# Patient Record
Sex: Male | Born: 1995 | Hispanic: Yes | Marital: Single | Smoking: Never smoker
Health system: Southern US, Community
[De-identification: ages and names within clinical notes are randomized; demographics above are authoritative.]

## PROBLEM LIST (undated history)

## (undated) DIAGNOSIS — G7100 Muscular dystrophy, unspecified: Secondary | ICD-10-CM

## (undated) DIAGNOSIS — I959 Hypotension, unspecified: Secondary | ICD-10-CM

## (undated) DIAGNOSIS — Z789 Other specified health status: Secondary | ICD-10-CM

## (undated) HISTORY — PX: COLOSTOMY: SHX63

## (undated) HISTORY — PX: EYE SURGERY: SHX253

## (undated) HISTORY — PX: SMALL INTESTINE SURGERY: SHX150

---
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
## 2016-05-04 ENCOUNTER — Emergency Department (HOSPITAL_BASED_OUTPATIENT_CLINIC_OR_DEPARTMENT_OTHER): Payer: 59

## 2016-05-04 ENCOUNTER — Encounter (HOSPITAL_BASED_OUTPATIENT_CLINIC_OR_DEPARTMENT_OTHER): Payer: Self-pay | Admitting: Emergency Medicine

## 2016-05-04 ENCOUNTER — Inpatient Hospital Stay (HOSPITAL_BASED_OUTPATIENT_CLINIC_OR_DEPARTMENT_OTHER)
Admission: EM | Admit: 2016-05-04 | Discharge: 2016-05-11 | DRG: 871 | Disposition: A | Discharge status: Home / Self Care | Payer: 59 | Attending: Family Medicine | Admitting: Family Medicine

## 2016-05-04 DIAGNOSIS — E8729 Other acidosis: Secondary | ICD-10-CM

## 2016-05-04 DIAGNOSIS — G7100 Muscular dystrophy, unspecified: Secondary | ICD-10-CM

## 2016-05-04 DIAGNOSIS — E873 Alkalosis: Secondary | ICD-10-CM

## 2016-05-04 DIAGNOSIS — E874 Mixed disorder of acid-base balance: Secondary | ICD-10-CM | POA: Diagnosis present

## 2016-05-04 DIAGNOSIS — E440 Moderate protein-calorie malnutrition: Secondary | ICD-10-CM

## 2016-05-04 DIAGNOSIS — A084 Viral intestinal infection, unspecified: Secondary | ICD-10-CM | POA: Diagnosis present

## 2016-05-04 DIAGNOSIS — A419 Sepsis, unspecified organism: Secondary | ICD-10-CM

## 2016-05-04 DIAGNOSIS — E876 Hypokalemia: Secondary | ICD-10-CM | POA: Diagnosis present

## 2016-05-04 DIAGNOSIS — E872 Acidosis: Secondary | ICD-10-CM

## 2016-05-04 DIAGNOSIS — D649 Anemia, unspecified: Secondary | ICD-10-CM | POA: Diagnosis present

## 2016-05-04 DIAGNOSIS — R000 Tachycardia, unspecified: Secondary | ICD-10-CM

## 2016-05-04 DIAGNOSIS — D72829 Elevated white blood cell count, unspecified: Secondary | ICD-10-CM

## 2016-05-04 DIAGNOSIS — D696 Thrombocytopenia, unspecified: Secondary | ICD-10-CM | POA: Diagnosis present

## 2016-05-04 DIAGNOSIS — Z993 Dependence on wheelchair: Secondary | ICD-10-CM

## 2016-05-04 DIAGNOSIS — Z681 Body mass index (BMI) 19 or less, adult: Secondary | ICD-10-CM

## 2016-05-04 DIAGNOSIS — J029 Acute pharyngitis, unspecified: Secondary | ICD-10-CM | POA: Diagnosis present

## 2016-05-04 DIAGNOSIS — E43 Unspecified severe protein-calorie malnutrition: Secondary | ICD-10-CM | POA: Diagnosis present

## 2016-05-04 DIAGNOSIS — E860 Dehydration: Secondary | ICD-10-CM | POA: Diagnosis present

## 2016-05-04 HISTORY — DX: Muscular dystrophy, unspecified: G71.00

## 2016-05-04 MED ORDER — SODIUM CHLORIDE 0.9 % IV BOLUS (SEPSIS)
1000.0000 mL | Freq: Once | INTRAVENOUS | Status: AC
  Administered 2016-05-05: 1000 mL via INTRAVENOUS

## 2016-05-04 MED ORDER — SODIUM CHLORIDE 0.9 % IV BOLUS (SEPSIS)
500.0000 mL | Freq: Once | INTRAVENOUS | Status: AC
  Administered 2016-05-05: 500 mL via INTRAVENOUS

## 2016-05-04 MED ORDER — ACETAMINOPHEN 500 MG PO TABS
1000.0000 mg | ORAL_TABLET | Freq: Once | ORAL | Status: DC
  Filled 2016-05-04: qty 2

## 2016-05-04 MED ORDER — ONDANSETRON HCL 4 MG/2ML IJ SOLN
4.0000 mg | Freq: Once | INTRAMUSCULAR | Status: AC
  Administered 2016-05-05: 4 mg via INTRAVENOUS
  Filled 2016-05-04: qty 2

## 2016-05-04 NOTE — ED Triage Notes (Signed)
Pt reports episodes of SOB with eating or drinking father brought to ED following episode of SOB while drinking Musinnex

## 2016-05-04 NOTE — ED Provider Notes (Signed)
[facility]PT [facility] Provider Note   CSN: [id number] Arrival date & time: 05/04/16  2235  First Provider Contact:  None     By signing my name below, I, Julien Nordmann, attest that this documentation has been prepared under the direction and in the presence of Everlene Balls, MD.  Electronically Signed: Julien Nordmann, ED Scribe. 05/04/16. 11:08 PM.   History   Chief Complaint Chief Complaint  Patient presents with  . Shortness of Breath     The history is provided by the patient and a parent. No language interpreter was used.   HPI Comments: Mumin Denomme is a 20 y.o. male who has a PMHx of muscular dystrophy presents to the Emergency Department complaining of sudden onset, gradual worsening, moderate nausea onset this afternoon. Per father, pt had an associated low grade fever of 37.8, sore throat, mild rhinorrhea, mild diarrhea. Pt has not been around any sick contacts. Father attempted to give pt some Mucinex but noted that he began to feel short of breath. Father state pt does not get frequent infections. Father denies cough, surgeries, or abdominal pain.   Past Medical History:  Diagnosis Date  . MD (muscular dystrophy) [facility])     Patient Active Problem List   Diagnosis Date Noted  . Sepsis ([place]) 05/05/2016    History reviewed. No pertinent surgical history.    Home Medications    Prior to Admission medications   Medication Sig Start Date End Date Taking? Authorizing Provider  Multiple Vitamin (MULTIVITAMIN) tablet Take 1 tablet by mouth daily.   Yes Historical Provider, MD    Family History History reviewed. No pertinent family history.  Social History Social History  Substance Use Topics  . Smoking status: Never Smoker  . Smokeless tobacco: Never Used  . Alcohol use No     Allergies   Review of patient's allergies indicates no known allergies.   Review of Systems Review of Systems A complete 10 system review of systems was obtained and all systems  are negative except as noted in the HPI and PMH.   Physical Exam Updated Vital Signs BP 121/68   Pulse (!) 126   Temp 99.7 F (37.6 C) (Oral)   Resp 19   Ht $R'5\' 3"'mC$  (1.6 m)   Wt 110 lb (49.9 kg)   SpO2 100%   BMI 19.49 kg/m   Physical Exam  Constitutional: He is oriented to person, place, and time. Vital signs are normal.  Non-toxic appearance. He does not appear ill. He appears distressed.  Distressed  Appears weak  HENT:  Head: Normocephalic and atraumatic.  Nose: Nose normal.  Mouth/Throat: No oropharyngeal exudate.  Dry oropharynx  Eyes: Conjunctivae and EOM are normal. Pupils are equal, round, and reactive to light. No scleral icterus.  Neck: Normal range of motion. Neck supple. No tracheal deviation, no edema, no erythema and normal range of motion present. No thyroid mass and no thyromegaly present.  Cardiovascular: Regular rhythm, S1 normal, S2 normal, normal heart sounds, intact distal pulses and normal pulses.  Tachycardia present.  Exam reveals no gallop and no friction rub.   No murmur heard. Pulmonary/Chest: Effort normal and breath sounds normal. No respiratory distress. He has no wheezes. He has no rhonchi. He has no rales.  Abdominal: Soft. Normal appearance and bowel sounds are normal. He exhibits no distension, no ascites and no mass. There is no hepatosplenomegaly. There is no tenderness. There is no rebound, no guarding and no CVA tenderness.  Musculoskeletal:  Muscular wasting  Moves all extremities Chronic contractures  Lymphadenopathy:    He has no cervical adenopathy.  Neurological: He is alert and oriented to person, place, and time. He has normal strength. No cranial nerve deficit or sensory deficit.  Skin: Skin is warm, dry and intact. No petechiae and no rash noted. He is not diaphoretic. No erythema. No pallor.  Nursing note and vitals reviewed.    ED Treatments / Results  DIAGNOSTIC STUDIES: Oxygen Saturation is 100% on RA, normal by my  interpretation.  COORDINATION OF CARE:  11:08 PM Discussed treatment plan with pt at bedside and pt agreed to plan.  Labs (all labs ordered are listed, but only abnormal results are displayed) Labs Reviewed  COMPREHENSIVE METABOLIC PANEL - Abnormal; Notable for the following:       Result Value   CO2 12 (*)    Glucose, Bld 55 (*)    Creatinine, Ser <0.30 (*)    Total Bilirubin 2.3 (*)    Anion gap 21 (*)    All other components within normal limits  CBC WITH DIFFERENTIAL/PLATELET - Abnormal; Notable for the following:    WBC 29.1 (*)    Neutro Abs 27.0 (*)    All other components within normal limits  CULTURE, BLOOD (ROUTINE X 2)  CULTURE, BLOOD (ROUTINE X 2)  URINE CULTURE  URINALYSIS, ROUTINE W REFLEX MICROSCOPIC (NOT AT [facility])  BLOOD GAS, ARTERIAL  I-STAT CG4 LACTIC ACID, ED  CBG MONITORING, ED    EKG  EKG Interpretation  Date/Time:  Friday May 04 2016 23:07:50 EDT Ventricular Rate:  126 PR Interval:    QRS Duration: 93 QT Interval:  368 QTC Calculation: 533 R Axis:   86 Text Interpretation:  Sinus tachycardia Borderline T wave abnormalities ST elevation, consider anterior injury Prolonged QT interval No old tracing to compare Confirmed by Glynn Octave [phone]) on 05/04/2016 11:14:54 PM       Radiology Ct Abdomen Pelvis Wo Contrast  Result Date: 05/05/2016 CLINICAL DATA:  20 year old male with history of muscular dystrophy presenting with fever. Evaluate for pneumoperitoneum. EXAM: CT ABDOMEN AND PELVIS WITHOUT CONTRAST TECHNIQUE: Multidetector CT imaging of the abdomen and pelvis was performed following the standard protocol without IV contrast. COMPARISON:  Chest radiograph dated 05/04/2016 FINDINGS: Evaluation of this exam is limited in the absence of intravenous contrast. The visualized lung bases are clear. No intra-abdominal free air or free fluid. Diffuse fatty infiltration of the liver. The gallbladder is not well visualized, likely contracted. No  calcified stone identified. The pancreas, spleen, adrenal glands, kidneys, visualized ureters, and urinary bladder appear unremarkable. The prostate and seminal vesicles are grossly unremarkable. Evaluation of the bowel is limited in the absence of oral contrast. There is air distention of the colon and rectosigmoid without evidence of mechanical obstruction. There is no evidence of small bowel obstruction or active inflammation. Normal appendix. The abdominal aorta and IVC appear unremarkable on this noncontrast study. No portal venous gas identified. There is no adenopathy. There is diffuse fatty replacement of the musculature compatible with known muscular dystrophy. The bones are osteopenic. There is scoliosis of the spine. Chronic left sacroiliitis. Vacuum phenomena. No acute fracture. IMPRESSION: No acute intra-abdominal pelvic pathology.  No free air. Air distal colon and rectosigmoid without evidence of mechanical obstruction. Findings may represent an adynamic ileus. Clinical correlation is recommended. Electronically Signed   By: Anner Crete M.D.   On: 05/05/2016 02:00  Dg Chest 2 View  Result Date: 05/05/2016 CLINICAL DATA:  20 year old male  with fever EXAM: CHEST  2 VIEW COMPARISON:  None FINDINGS: The lungs are clear. There is no pleural effusion or pneumothorax. The cardiac silhouette is within normal limits. No acute osseous pathology identified. Air distended loop of colon noted under the left hemidiaphragm. There is air in the upper abdomen outlining both sides of the colonic wall. Although this likely represents air within adjacent distended bowel, pneumoperitoneum is not excluded. Correlation with clinical exam is recommended. If there is clinical concern for pneumoperitoneum CT is recommended for further evaluation. IMPRESSION: No active cardiopulmonary disease. Air distended loops of bowel in the upper abdomen. Pneumoperitoneum is not excluded. Correlation with clinical exam  recommended. CT may provide better evaluation if there is clinical concern for pneumoperitoneum. Electronically Signed   By: Anner Crete M.D.   On: 05/05/2016 01:03   Procedures Procedures (including critical care time)  Medications Ordered in ED Medications  piperacillin-tazobactam (ZOSYN) IVPB 3.375 g (3.375 g Intravenous New Bag/Given 05/05/16 0229)  ketorolac (TORADOL) 30 MG/ML injection 30 mg (not administered)  sodium chloride 0.9 % bolus 1,000 mL (not administered)  sodium chloride 0.9 % bolus 1,000 mL (0 mLs Intravenous Stopped 05/05/16 0156)    And  sodium chloride 0.9 % bolus 500 mL (0 mLs Intravenous Stopped 05/05/16 0235)  ondansetron (ZOFRAN) injection 4 mg (4 mg Intravenous Given 05/05/16 0113)  vancomycin (VANCOCIN) IVPB 1000 mg/200 mL premix (1,000 mg Intravenous New Bag/Given 05/05/16 0116)  dextrose 50 % solution 50 mL (50 mLs Intravenous Given 05/05/16 0122)   Angiocath insertion Performed by: Everlene Balls  Consent: Verbal consent obtained. Risks and benefits: risks, benefits and alternatives were discussed Time out: Immediately prior to procedure a "time out" was called to verify the correct patient, procedure, equipment, support staff and site/side marked as required.  Preparation: Patient was prepped and draped in the usual sterile fashion.  Vein Location: L IJ  Ultrasound Guided  Gauge: 20G  Normal blood return and flush without difficulty Patient tolerance: Patient tolerated the procedure well with no immediate complications.     Initial Impression / Assessment and Plan / ED Course  I have reviewed the triage vital signs and the nursing notes.  Pertinent labs & imaging results that were available during my care of the patient were reviewed by me and considered in my medical decision making (see chart for details).  Clinical Course    Patient presents to the ED for SOB and nausea and feeling ill today.  I have concern for sepsis with a rectal temp of  37.8 which is basically a fever, HR was 145 and he does not appear well.  Code sepsis was called.  Will continue to closely monitor.  2:39 AM Patient given tylenol and IVF.  HR is improving, currently 120s.  CXR shows possible pneumoperitoneum, his PE and history is not consistent with this.  Will obtain CT scan for further evaluation.  He was given vanc and zosyn for broad coverage. Patient will likely need stepdown for further care.   2:39 AM Dr. Gloris Ham accepts for admission. He is requesting ABG.  Patients VS all continue to improve with treatment.  Will recheck CBG.    CRITICAL CARE Performed by: Everlene Balls   Total critical care time: 55 minutes - sepsis  Critical care time was exclusive of separately billable procedures and treating other patients.  Critical care was necessary to treat or prevent imminent or life-threatening deterioration.  Critical care was time spent personally by me on the following activities: development of treatment  plan with patient and/or surrogate as well as nursing, discussions with consultants, evaluation of patient's response to treatment, examination of patient, obtaining history from patient or surrogate, ordering and performing treatments and interventions, ordering and review of laboratory studies, ordering and review of radiographic studies, pulse oximetry and re-evaluation of patient's condition.   Final Clinical Impressions(s) / ED Diagnoses   Final diagnoses:  Sepsis, due to unspecified organism [facility])   I personally performed the services described in this documentation, which was scribed in my presence. The recorded information has been reviewed and is accurate.    New Prescriptions New Prescriptions   No medications on file     Everlene Balls, MD 05/05/16 [phone]

## 2016-05-04 NOTE — ED Notes (Signed)
MD at bedside. 

## 2016-05-05 ENCOUNTER — Emergency Department (HOSPITAL_BASED_OUTPATIENT_CLINIC_OR_DEPARTMENT_OTHER): Payer: 59

## 2016-05-05 ENCOUNTER — Inpatient Hospital Stay (HOSPITAL_COMMUNITY): Payer: 59

## 2016-05-05 DIAGNOSIS — A409 Streptococcal sepsis, unspecified: Secondary | ICD-10-CM | POA: Diagnosis not present

## 2016-05-05 DIAGNOSIS — D649 Anemia, unspecified: Secondary | ICD-10-CM | POA: Diagnosis present

## 2016-05-05 DIAGNOSIS — R000 Tachycardia, unspecified: Secondary | ICD-10-CM | POA: Diagnosis not present

## 2016-05-05 DIAGNOSIS — E440 Moderate protein-calorie malnutrition: Secondary | ICD-10-CM | POA: Diagnosis not present

## 2016-05-05 DIAGNOSIS — E872 Acidosis: Secondary | ICD-10-CM

## 2016-05-05 DIAGNOSIS — D696 Thrombocytopenia, unspecified: Secondary | ICD-10-CM | POA: Diagnosis present

## 2016-05-05 DIAGNOSIS — E873 Alkalosis: Secondary | ICD-10-CM

## 2016-05-05 DIAGNOSIS — D72829 Elevated white blood cell count, unspecified: Secondary | ICD-10-CM

## 2016-05-05 DIAGNOSIS — Z681 Body mass index (BMI) 19 or less, adult: Secondary | ICD-10-CM | POA: Diagnosis not present

## 2016-05-05 DIAGNOSIS — A419 Sepsis, unspecified organism: Secondary | ICD-10-CM | POA: Diagnosis present

## 2016-05-05 DIAGNOSIS — E876 Hypokalemia: Secondary | ICD-10-CM | POA: Diagnosis present

## 2016-05-05 DIAGNOSIS — E874 Mixed disorder of acid-base balance: Secondary | ICD-10-CM | POA: Diagnosis present

## 2016-05-05 DIAGNOSIS — E43 Unspecified severe protein-calorie malnutrition: Secondary | ICD-10-CM | POA: Diagnosis present

## 2016-05-05 DIAGNOSIS — G7100 Muscular dystrophy, unspecified: Secondary | ICD-10-CM

## 2016-05-05 DIAGNOSIS — A084 Viral intestinal infection, unspecified: Secondary | ICD-10-CM | POA: Diagnosis present

## 2016-05-05 DIAGNOSIS — Z993 Dependence on wheelchair: Secondary | ICD-10-CM | POA: Diagnosis not present

## 2016-05-05 DIAGNOSIS — E8729 Other acidosis: Secondary | ICD-10-CM

## 2016-05-05 DIAGNOSIS — J029 Acute pharyngitis, unspecified: Secondary | ICD-10-CM | POA: Diagnosis present

## 2016-05-05 DIAGNOSIS — E860 Dehydration: Secondary | ICD-10-CM | POA: Diagnosis present

## 2016-05-05 DIAGNOSIS — I428 Other cardiomyopathies: Secondary | ICD-10-CM | POA: Diagnosis not present

## 2016-05-05 HISTORY — DX: Tachycardia, unspecified: R00.0

## 2016-05-05 HISTORY — DX: Elevated white blood cell count, unspecified: D72.829

## 2016-05-05 HISTORY — DX: Sepsis, unspecified organism: A41.9

## 2016-05-05 HISTORY — DX: Acidosis: E87.2

## 2016-05-05 HISTORY — DX: Other acidosis: E87.29

## 2016-05-05 HISTORY — DX: Alkalosis: E87.3

## 2016-05-05 LAB — COMPREHENSIVE METABOLIC PANEL
ALK PHOS: 77 U/L (ref 38–126)
ALT: 27 U/L (ref 17–63)
AST: 38 U/L (ref 15–41)
Albumin: 5 g/dL (ref 3.5–5.0)
Anion gap: 21 — ABNORMAL HIGH (ref 5–15)
BUN: 10 mg/dL (ref 6–20)
CALCIUM: 9.8 mg/dL (ref 8.9–10.3)
CHLORIDE: 104 mmol/L (ref 101–111)
CO2: 12 mmol/L — AB (ref 22–32)
Glucose, Bld: 55 mg/dL — ABNORMAL LOW (ref 65–99)
Potassium: 3.5 mmol/L (ref 3.5–5.1)
SODIUM: 137 mmol/L (ref 135–145)
Total Bilirubin: 2.3 mg/dL — ABNORMAL HIGH (ref 0.3–1.2)
Total Protein: 8 g/dL (ref 6.5–8.1)

## 2016-05-05 LAB — URINALYSIS, ROUTINE W REFLEX MICROSCOPIC
GLUCOSE, UA: 500 mg/dL — AB
Hgb urine dipstick: NEGATIVE
LEUKOCYTES UA: NEGATIVE
NITRITE: NEGATIVE
PROTEIN: NEGATIVE mg/dL
Specific Gravity, Urine: 1.019 (ref 1.005–1.030)
pH: 5.5 (ref 5.0–8.0)

## 2016-05-05 LAB — CBC WITH DIFFERENTIAL/PLATELET
BASOS PCT: 0 %
Band Neutrophils: 10 %
Basophils Absolute: 0 10*3/uL (ref 0.0–0.1)
EOS PCT: 0 %
Eosinophils Absolute: 0 10*3/uL (ref 0.0–0.7)
HEMATOCRIT: 45.5 % (ref 39.0–52.0)
HEMOGLOBIN: 15.3 g/dL (ref 13.0–17.0)
LYMPHS ABS: 1.5 10*3/uL (ref 0.7–4.0)
Lymphocytes Relative: 5 %
MCH: 29.8 pg (ref 26.0–34.0)
MCHC: 33.6 g/dL (ref 30.0–36.0)
MCV: 88.5 fL (ref 78.0–100.0)
METAMYELOCYTES PCT: 1 %
Monocytes Absolute: 0.6 10*3/uL (ref 0.1–1.0)
Monocytes Relative: 2 %
NEUTROS ABS: 27 10*3/uL — AB (ref 1.7–7.7)
Neutrophils Relative %: 82 %
Platelets: 285 10*3/uL (ref 150–400)
RBC: 5.14 MIL/uL (ref 4.22–5.81)
RDW: 14 % (ref 11.5–15.5)
WBC: 29.1 10*3/uL — ABNORMAL HIGH (ref 4.0–10.5)

## 2016-05-05 LAB — I-STAT ARTERIAL BLOOD GAS, ED
Acid-base deficit: 16 mmol/L — ABNORMAL HIGH (ref 0.0–2.0)
Bicarbonate: 9.7 meq/L — ABNORMAL LOW (ref 20.0–24.0)
O2 Saturation: 98 %
Patient temperature: 98.5
TCO2: 10 mmol/L (ref 0–100)
pCO2 arterial: 23.8 mmHg — ABNORMAL LOW (ref 35.0–45.0)
pH, Arterial: 7.218 — ABNORMAL LOW (ref 7.350–7.450)
pO2, Arterial: 126 mmHg — ABNORMAL HIGH (ref 80.0–100.0)

## 2016-05-05 LAB — CBG MONITORING, ED: GLUCOSE-CAPILLARY: 202 mg/dL — AB (ref 65–99)

## 2016-05-05 LAB — RAPID STREP SCREEN (MED CTR MEBANE ONLY): STREPTOCOCCUS, GROUP A SCREEN (DIRECT): NEGATIVE

## 2016-05-05 LAB — TSH: TSH: 1.583 u[IU]/mL (ref 0.350–4.500)

## 2016-05-05 LAB — PROCALCITONIN: PROCALCITONIN: 0.11 ng/mL

## 2016-05-05 LAB — MRSA PCR SCREENING: MRSA by PCR: NEGATIVE

## 2016-05-05 LAB — MONONUCLEOSIS SCREEN: Mono Screen: NEGATIVE

## 2016-05-05 LAB — I-STAT CG4 LACTIC ACID, ED: LACTIC ACID, VENOUS: 0.88 mmol/L (ref 0.5–1.9)

## 2016-05-05 MED ORDER — PIPERACILLIN-TAZOBACTAM 3.375 G IVPB 30 MIN: 3.3750 g | Freq: Once | INTRAVENOUS | Status: DC

## 2016-05-05 MED ORDER — PIPERACILLIN-TAZOBACTAM 3.375 G IVPB 30 MIN
3.3750 g | Freq: Once | INTRAVENOUS | Status: AC
  Administered 2016-05-05: 3.375 g via INTRAVENOUS
  Filled 2016-05-05 (×2): qty 50

## 2016-05-05 MED ORDER — ONDANSETRON HCL 4 MG/2ML IJ SOLN
4.0000 mg | Freq: Four times a day (QID) | INTRAMUSCULAR | Status: DC | PRN
  Administered 2016-05-05 (×2): 4 mg via INTRAVENOUS
  Filled 2016-05-05 (×2): qty 2

## 2016-05-05 MED ORDER — SODIUM CHLORIDE 0.9 % IV SOLN: Freq: Once | INTRAVENOUS | Status: DC

## 2016-05-05 MED ORDER — KETOROLAC TROMETHAMINE 30 MG/ML IJ SOLN
30.0000 mg | Freq: Once | INTRAMUSCULAR | Status: AC
Start: 2016-05-05 — End: 2016-05-10

## 2016-05-05 MED ORDER — SODIUM CHLORIDE 0.9 % IV SOLN: 250.0000 mL | INTRAVENOUS | Status: DC | PRN

## 2016-05-05 MED ORDER — KCL IN DEXTROSE-NACL 10-5-0.45 MEQ/L-%-% IV SOLN
INTRAVENOUS | Status: DC
  Administered 2016-05-05 – 2016-05-07 (×3): via INTRAVENOUS
  Filled 2016-05-05 (×5): qty 1000

## 2016-05-05 MED ORDER — SODIUM CHLORIDE 0.9 % IV BOLUS (SEPSIS)
1000.0000 mL | Freq: Once | INTRAVENOUS | Status: AC
  Administered 2016-05-05: 1000 mL via INTRAVENOUS

## 2016-05-05 MED ORDER — VANCOMYCIN HCL 500 MG IV SOLR
500.0000 mg | Freq: Two times a day (BID) | INTRAVENOUS | Status: DC
  Administered 2016-05-05 – 2016-05-06 (×2): 500 mg via INTRAVENOUS
  Filled 2016-05-05 (×3): qty 500

## 2016-05-05 MED ORDER — VANCOMYCIN HCL IN DEXTROSE 1-5 GM/200ML-% IV SOLN: 1000.0000 mg | Freq: Once | INTRAVENOUS | Status: DC

## 2016-05-05 MED ORDER — SODIUM CHLORIDE 0.9% FLUSH
3.0000 mL | Freq: Two times a day (BID) | INTRAVENOUS | Status: DC
  Administered 2016-05-06 – 2016-05-10 (×3): 3 mL via INTRAVENOUS

## 2016-05-05 MED ORDER — LORAZEPAM 1 MG PO TABS
1.0000 mg | ORAL_TABLET | Freq: Once | ORAL | Status: DC
  Filled 2016-05-05 (×2): qty 1

## 2016-05-05 MED ORDER — PIPERACILLIN-TAZOBACTAM 3.375 G IVPB
3.3750 g | Freq: Three times a day (TID) | INTRAVENOUS | Status: DC
  Administered 2016-05-05 – 2016-05-06 (×5): 3.375 g via INTRAVENOUS
  Filled 2016-05-05 (×7): qty 50

## 2016-05-05 MED ORDER — ENOXAPARIN SODIUM 40 MG/0.4ML ~~LOC~~ SOLN: 40.0000 mg | SUBCUTANEOUS | Status: DC

## 2016-05-05 MED ORDER — VANCOMYCIN HCL IN DEXTROSE 1-5 GM/200ML-% IV SOLN
1000.0000 mg | Freq: Once | INTRAVENOUS | Status: AC
  Administered 2016-05-05: 1000 mg via INTRAVENOUS
  Filled 2016-05-05: qty 200

## 2016-05-05 MED ORDER — SODIUM CHLORIDE 0.9% FLUSH: 3.0000 mL | INTRAVENOUS | Status: DC | PRN

## 2016-05-05 MED ORDER — ENOXAPARIN SODIUM 30 MG/0.3ML ~~LOC~~ SOLN
30.0000 mg | SUBCUTANEOUS | Status: DC
  Administered 2016-05-05 – 2016-05-10 (×5): 30 mg via SUBCUTANEOUS
  Filled 2016-05-05 (×5): qty 0.3

## 2016-05-05 MED ORDER — SODIUM CHLORIDE 0.9 % IV SOLN
Freq: Once | INTRAVENOUS | Status: AC
  Administered 2016-05-05: 04:00:00 via INTRAVENOUS

## 2016-05-05 MED ORDER — DEXTROSE 50 % IV SOLN
50.0000 mL | Freq: Once | INTRAVENOUS | Status: AC
  Administered 2016-05-05: 50 mL via INTRAVENOUS
  Filled 2016-05-05: qty 50

## 2016-05-05 NOTE — ED Notes (Signed)
Pt developed flushing to face after vancomycin completed EDP notified

## 2016-05-05 NOTE — ED Notes (Signed)
Pt has refused to have the In and Out cath done.  Pt is also unable to void, although he did attempt with the assistance of his father.

## 2016-05-05 NOTE — Progress Notes (Signed)
Pharmacy Antibiotic Note  Peter Hayes is a 20 y.o. male admitted on 05/04/2016 with sepsis.  Pharmacy has been consulted for vancomycin dosing.  Plan: Vancomycin $RemoveBeforeDE'500mg'JnnLHwjMgcEjfLx$  IV every 12 hours.  Goal trough 15-20 mcg/mL.  Height: $Remove'5\' 3"'VOnAjmR$  (160 cm) Weight: 83 lb 1.8 oz (37.7 kg) IBW/kg (Calculated) : 56.9  Temp (24hrs), Avg:99.5 F (37.5 C), Min:99 F (37.2 C), Max:100 F (37.8 C)   Recent Labs Lab 05/05/16 0030 05/05/16 0039  WBC 29.1*  --   CREATININE <0.30*  --   LATICACIDVEN  --  0.88     No Known Allergies    Thank you for allowing pharmacy to be a part of this patient's care.  Wynona Neat, PharmD, BCPS  05/05/2016 7:17 AM

## 2016-05-05 NOTE — H&P (Signed)
[facility] History and Physical  Peter Hayes GQQ:[id number] DOB: [birth year] DOA: 05/04/2016  Referring physician: ER PCP: Pcp Not In System   Chief Complaint: sob/n/diarrhea  HPI: Peter Hayes is a 20 y.o. male with significant past medical history muscular dystrophy and wheelchair-bound, presents to [facility] ER complaining of sudden onset and gradual worsening nausea, sore throat, rhinorrhea, as well as mild diarrhea. Dad who is at bedside and is giving me most of history. Dad said that initially they try to give him some Mucinex but he did not improve and began having more shortness of breath, so they brought into the outside ER for further evaluation. No history of productive cough, or abdominal pain.  No sick contacts.  Dad says his bowel months are fairly regular daily and typically small amounts. Patient normally eats a regular diet.  [name] are his caretakers.  Patient currently denies any pain. Feels a little bit better.   Outside ER. Patient was given a dose of vancomycin as well as Zosyn and given, almost 4 L of NS, Right EJ placed. VS outside" 121/68 p 126 t 99.7  rr 19  wht 110lbs   Review of Systems:  Per history of present illness, otherwise all systems reviewed, essentially unremarkable except for above.  Past Medical History:  Diagnosis Date  . MD (muscular dystrophy) ([place])    History reviewed. No pertinent surgical history. Social History:  reports that he has never smoked. He has never used smokeless tobacco. He reports that he does not drink alcohol or use drugs.  No Known Allergies  History reviewed. No pertinent family history.   Prior to Admission medications   Medication Sig Start Date End Date Taking? Authorizing Provider  Multiple Vitamin (MULTIVITAMIN) tablet Take 1 tablet by mouth daily.   Yes Historical Provider, MD   Physical Exam: Vitals:   05/05/16 0521 05/05/16 0530 05/05/16 0646 05/05/16 0647  BP:  (!) 108/51  (!) 96/49  Pulse:     (!) 131  Resp: $Remo'15 17  19  'SoYaO$ Temp:    99.3 F (37.4 C)  TempSrc:    Oral  SpO2:    100%  Weight:   37.7 kg (83 lb 1.8 oz)   Height:        Wt Readings from Last 3 Encounters:  05/05/16 37.7 kg (83 lb 1.8 oz)    General:  Appears calm and comfortable, pleasant, NAD, AAOx3,  Eyes: PERRL, normal lids, irises & conjunctiva ENT: grossly normal hearing, lips & tongue, dry mmm Neck: no LAD, masses or thyromegaly Cardiovascular: s1 s1 tachycardic, no m/r/g. No LE edema. Telemetry: ST, no arrhythmias  Respiratory: CTA bilaterally, no w/r/r. Normal respiratory effort. Abdomen: soft, ntnd, no g/r. Skin: no rash or induration seen on limited exam Musculoskeletal: diffuse muscle atrophy all 4 extrems,  Psychiatric: grossly normal mood and affect, speech fluent and appropriate Neurologic: grossly non-focal.          Labs on Admission:  Basic Metabolic Panel:  Recent Labs Lab 05/05/16 0030  NA 137  K 3.5  CL 104  CO2 12*  GLUCOSE 55*  BUN 10  CREATININE <0.30*  CALCIUM 9.8   Liver Function Tests:  Recent Labs Lab 05/05/16 0030  AST 38  ALT 27  ALKPHOS 77  BILITOT 2.3*  PROT 8.0  ALBUMIN 5.0   No results for input(s): LIPASE, AMYLASE in the last 168 hours. No results for input(s): AMMONIA in the last 168 hours. CBC:  Recent Labs Lab  05/05/16 0030  WBC 29.1*  NEUTROABS 27.0*  HGB 15.3  HCT 45.5  MCV 88.5  PLT 285   Cardiac Enzymes: No results for input(s): CKTOTAL, CKMB, CKMBINDEX, TROPONINI in the last 168 hours.  BNP (last 3 results) No results for input(s): BNP in the last 8760 hours.  ProBNP (last 3 results) No results for input(s): PROBNP in the last 8760 hours.  CBG:  Recent Labs Lab 05/05/16 0304  GLUCAP 202*    Radiological Exams on Admission: Ct Abdomen Pelvis Wo Contrast  Result Date: 05/05/2016 CLINICAL DATA:  20 year old male with history of muscular dystrophy presenting with fever. Evaluate for pneumoperitoneum. EXAM: CT ABDOMEN AND  PELVIS WITHOUT CONTRAST TECHNIQUE: Multidetector CT imaging of the abdomen and pelvis was performed following the standard protocol without IV contrast. COMPARISON:  Chest radiograph dated 05/04/2016 FINDINGS: Evaluation of this exam is limited in the absence of intravenous contrast. The visualized lung bases are clear. No intra-abdominal free air or free fluid. Diffuse fatty infiltration of the liver. The gallbladder is not well visualized, likely contracted. No calcified stone identified. The pancreas, spleen, adrenal glands, kidneys, visualized ureters, and urinary bladder appear unremarkable. The prostate and seminal vesicles are grossly unremarkable. Evaluation of the bowel is limited in the absence of oral contrast. There is air distention of the colon and rectosigmoid without evidence of mechanical obstruction. There is no evidence of small bowel obstruction or active inflammation. Normal appendix. The abdominal aorta and IVC appear unremarkable on this noncontrast study. No portal venous gas identified. There is no adenopathy. There is diffuse fatty replacement of the musculature compatible with known muscular dystrophy. The bones are osteopenic. There is scoliosis of the spine. Chronic left sacroiliitis. Vacuum phenomena. No acute fracture. IMPRESSION: No acute intra-abdominal pelvic pathology.  No free air. Air distal colon and rectosigmoid without evidence of mechanical obstruction. Findings may represent an adynamic ileus. Clinical correlation is recommended. Electronically Signed   By: Anner Crete M.D.   On: 05/05/2016 02:00  Dg Chest 2 View  Result Date: 05/05/2016 CLINICAL DATA:  20 year old male with fever EXAM: CHEST  2 VIEW COMPARISON:  None FINDINGS: The lungs are clear. There is no pleural effusion or pneumothorax. The cardiac silhouette is within normal limits. No acute osseous pathology identified. Air distended loop of colon noted under the left hemidiaphragm. There is air in the  upper abdomen outlining both sides of the colonic wall. Although this likely represents air within adjacent distended bowel, pneumoperitoneum is not excluded. Correlation with clinical exam is recommended. If there is clinical concern for pneumoperitoneum CT is recommended for further evaluation. IMPRESSION: No active cardiopulmonary disease. Air distended loops of bowel in the upper abdomen. Pneumoperitoneum is not excluded. Correlation with clinical exam recommended. CT may provide better evaluation if there is clinical concern for pneumoperitoneum. Electronically Signed   By: Anner Crete M.D.   On: 05/05/2016 01:03   EKG: Independently reviewed.   EKG Interpretation  Date/Time:  Friday May 04 2016 23:07:50 EDT Ventricular Rate:  126 PR Interval:    QRS Duration: 93 QT Interval:  368 QTC Calculation: 533 R Axis:   86 Text Interpretation:  Sinus tachycardia Borderline T wave abnormalities ST elevation, consider anterior injury Prolonged QT interval No old tracing to compare Confirmed by Glynn Octave [phone]) on 05/04/2016 11:14:54 PM        Assessment/Plan Principal Problem:   Sepsis ([place]) Active Problems:   Muscular dystrophy ([place])   Moderate protein-calorie malnutrition ([facility])   Tachycardia  Leukocytosis   High anion gap metabolic acidosis   Respiratory alkalosis   1. Sepsis, w/ tachycardia, fevers, leukocytosis, source unclear.  - Admit to stepdown unit, pancultured, broad-spectrum antibiotics with Zosyn and vancomycin, pharmacy assistance vancomycin dosing  - Check rapid strep, as well as Monospot, check procalcitonin  - Aggressive IV fluids.   -  Follow up with cultures  - Chest x-ray and UA were unremarkable. CT abdomen showed adynamic ileus but no other acute findings. 2. Anion-gap metabolic acidosis with a superimposed respiratory alkalosis  - Unclear cause, could be due to starvation ketosis given his moderate calorie malnutrition, lactic acidosis was  normal, there is some mild diarrhea as well superimposed on this.  - No suspected history of alcoholic ingestion.  No signs of diabetic diabetes, although patient was hypoglycemic on initial admission 3. Moderate calorie malnutrition, ?with starvation ketosis - soft diet initially, adat 4. Tachycardia with signs of dehydration and hemoconcentration  - chk tsh  - continue ivf 5. Muscular dystrophy, wheelchair bound 6. Sore throat/rhinorhea on initial presentation - viral syndrome? 7. Adynamic ileus  - frequent turning      Code Status: Full DVT Prophylaxis: lovenox 40sq Family Communication: patient and dad at bedside Disposition Plan: home  Time spent: 52mins  Maren Reamer MD., MBA/MHA [facility] Pager [phone]

## 2016-05-05 NOTE — Evaluation (Signed)
Clinical/Bedside Swallow Evaluation Patient Details  Name: Kouper Spinella MRN: [id number] Date of Birth: [birth year]  Today's Date: 05/05/2016 Time: SLP Start Time (ACUTE ONLY): 1125 SLP Stop Time (ACUTE ONLY): 1155 SLP Time Calculation (min) (ACUTE ONLY): 30 min  Past Medical History:  Past Medical History:  Diagnosis Date  . MD (muscular dystrophy) [facility])    Past Surgical History: History reviewed. No pertinent surgical history. HPI:  Patient is a 20 y.o. male with h/o muscular dystrophy and is wheelchair bound, who presented to [facility] ER with c/o sudden onset and gradual worsening nausea, sore throat, rhinorrhea and mild diarrhea. Patient's parents are his caretakers and Dad reports that patient normally eats a regular diet.   Assessment / Plan / Recommendation Clinical Impression  Patient presents with a mild-mod oral dysphagia, characterized by decreased lingual strength, delays in mastication, oral manipulation and transit of puree bolus, however patient with adequate laryngeal elevation and pharyngeal contraction per palpation with no overt s/s of aspiration. Patient continues with nausea, and appeared to become nauseous with very small bites of applesauce. I feel that this nausea is contributing significantly (and may be the primary reason) for his current dysphagia and decreased intake. His Dad reported that at home, Marlyn eats a normal diet, has a good appetite and eats regular-sized meals.    Aspiration Risk  Mild aspiration risk    Diet Recommendation Dysphagia 3 (Mech soft);Thin liquid;Other (Comment)   Liquid Administration via: Straw;Cup Medication Administration: Whole meds with liquid Supervision: Staff to assist with self feeding;Full supervision/cueing for compensatory strategies Compensations: Minimize environmental distractions;Follow solids with liquid Postural Changes: Seated upright at 90 degrees    Other  Recommendations Recommended Consults: Consider GI  evaluation (secondary to ongoing nausea)   Follow up Recommendations   (TBD)    Frequency and Duration min 2x/week  1 week       Prognosis Prognosis for Safe Diet Advancement: Good      Swallow Study   General Date of Onset: 05/04/16 HPI: Patient is a 20 y.o. male with h/o muscular dystrophy and is wheelchair bound, who presented to [facility] ER with c/o sudden onset and gradual worsening nausea, sore throat, rhinorrhea and mild diarrhea. Patient's parents are his caretakers and Dad reports that patient normally eats a regular diet. Type of Study: Bedside Swallow Evaluation Previous Swallow Assessment: N/A Diet Prior to this Study: Dysphagia 3 (soft);Thin liquids Temperature Spikes Noted: No Respiratory Status: Room air History of Recent Intubation: No Behavior/Cognition: Alert;Cooperative;Pleasant mood Oral Cavity Assessment: Other (comment) (tongue was pink, but appeared smooth) Oral Care Completed by SLP: No Oral Cavity - Dentition: Adequate natural dentition Self-Feeding Abilities: Needs assist Patient Positioning: Upright in bed;Other (comment) (sitting upright at edge of bed with his Dad sitting next to him to support this posture) Baseline Vocal Quality: Normal;Low vocal intensity Volitional Cough: Weak Volitional Swallow: Able to elicit    Oral/Motor/Sensory Function Overall Oral Motor/Sensory Function: Mild impairment Facial ROM: Within Functional Limits Facial Symmetry: Within Functional Limits Facial Strength: Within Functional Limits Facial Sensation: Within Functional Limits Lingual ROM: Within Functional Limits Lingual Symmetry: Within Functional Limits Lingual Strength: Reduced   Ice Chips     Thin Liquid Thin Liquid: Within functional limits Presentation: Straw Other Comments: No overt s/s of aspiration with straw sips of thin liquids    Nectar Thick Nectar Thick Liquid: Not tested   Honey Thick Honey Thick Liquid: Not tested   Puree Puree:  Impaired Oral Phase Impairments: Impaired mastication Oral Phase Functional  Implications: Prolonged oral transit Other Comments: Patient took very small (approximately 1/4 teaspoon) bites of puree, was chewing when eating applesauce, and oral transit and swallow initiation were both delayed. Patient started to become nauseous with applesauce, and he feels that it is the nausea that is impacting his swallowing at the time being.   Solid      Solid: Not tested         Sonia Baller, MA, CCC-SLP 05/05/16 4:03 PM

## 2016-05-05 NOTE — ED Notes (Signed)
Patient transported to X-ray 

## 2016-05-05 NOTE — ED Notes (Signed)
Mr Yun voided 559ml per urine after 1.5 liters of NSS .Specimen obtained and sent to lab. 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  EDP made aware no new orders obtained.

## 2016-05-05 NOTE — Plan of Care (Signed)
20 yo M with h/o MD.  Patient with nausea and sepsis in ED.  CT abd/pelvis done and just shows gaseous distention of colon suspicious for adynamic ileus.  Getting ABG due to bicarb of 12 and having them send to SDU.  Unclear why he has an anion gap metabolic acidosis, lactate is WNL at 0.88.  He refused cath for UA so everyone is waiting for him to go to test this.

## 2016-05-05 NOTE — ED Notes (Signed)
Patient denies pain and is resting comfortably.  

## 2016-05-05 NOTE — ED Notes (Signed)
[facility] called with consult to [facility]

## 2016-05-06 DIAGNOSIS — G710 Muscular dystrophy: Secondary | ICD-10-CM

## 2016-05-06 DIAGNOSIS — A419 Sepsis, unspecified organism: Principal | ICD-10-CM

## 2016-05-06 DIAGNOSIS — E872 Acidosis: Secondary | ICD-10-CM

## 2016-05-06 LAB — BLOOD CULTURE ID PANEL (REFLEXED)
Acinetobacter baumannii: NOT DETECTED
CANDIDA KRUSEI: NOT DETECTED
CANDIDA PARAPSILOSIS: NOT DETECTED
CARBAPENEM RESISTANCE: NOT DETECTED
Candida albicans: NOT DETECTED
Candida glabrata: NOT DETECTED
Candida tropicalis: NOT DETECTED
ENTEROBACTERIACEAE SPECIES: NOT DETECTED
Enterobacter cloacae complex: NOT DETECTED
Enterococcus species: NOT DETECTED
Escherichia coli: NOT DETECTED
Haemophilus influenzae: NOT DETECTED
KLEBSIELLA OXYTOCA: NOT DETECTED
KLEBSIELLA PNEUMONIAE: NOT DETECTED
Listeria monocytogenes: NOT DETECTED
Methicillin resistance: NOT DETECTED
Neisseria meningitidis: NOT DETECTED
PSEUDOMONAS AERUGINOSA: NOT DETECTED
Proteus species: NOT DETECTED
STAPHYLOCOCCUS SPECIES: DETECTED — AB
STREPTOCOCCUS AGALACTIAE: NOT DETECTED
STREPTOCOCCUS PNEUMONIAE: NOT DETECTED
Serratia marcescens: NOT DETECTED
Staphylococcus aureus (BCID): NOT DETECTED
Streptococcus pyogenes: NOT DETECTED
Streptococcus species: NOT DETECTED
Vancomycin resistance: NOT DETECTED

## 2016-05-06 LAB — CBC
HEMATOCRIT: 39.2 % (ref 39.0–52.0)
Hemoglobin: 12.9 g/dL — ABNORMAL LOW (ref 13.0–17.0)
MCH: 29 pg (ref 26.0–34.0)
MCHC: 32.9 g/dL (ref 30.0–36.0)
MCV: 88.1 fL (ref 78.0–100.0)
PLATELETS: 164 10*3/uL (ref 150–400)
RBC: 4.45 MIL/uL (ref 4.22–5.81)
RDW: 13.8 % (ref 11.5–15.5)
WBC: 8.1 10*3/uL (ref 4.0–10.5)

## 2016-05-06 LAB — URINE CULTURE: Culture: NO GROWTH

## 2016-05-06 LAB — BASIC METABOLIC PANEL
Anion gap: 8 (ref 5–15)
CHLORIDE: 111 mmol/L (ref 101–111)
CO2: 19 mmol/L — ABNORMAL LOW (ref 22–32)
Calcium: 8.6 mg/dL — ABNORMAL LOW (ref 8.9–10.3)
GLUCOSE: 114 mg/dL — AB (ref 65–99)
POTASSIUM: 2.3 mmol/L — AB (ref 3.5–5.1)
SODIUM: 138 mmol/L (ref 135–145)

## 2016-05-06 LAB — MAGNESIUM: MAGNESIUM: 1.5 mg/dL — AB (ref 1.7–2.4)

## 2016-05-06 LAB — PHOSPHORUS: PHOSPHORUS: 1.4 mg/dL — AB (ref 2.5–4.6)

## 2016-05-06 MED ORDER — BOOST / RESOURCE BREEZE PO LIQD
1.0000 | ORAL | Status: DC
  Administered 2016-05-07: 14:00:00 via ORAL
  Administered 2016-05-08 – 2016-05-11 (×3): 1 via ORAL

## 2016-05-06 MED ORDER — POTASSIUM CHLORIDE 10 MEQ/100ML IV SOLN
10.0000 meq | INTRAVENOUS | Status: AC
  Administered 2016-05-06 (×5): 10 meq via INTRAVENOUS
  Filled 2016-05-06 (×5): qty 100

## 2016-05-06 MED ORDER — THIAMINE HCL 100 MG/ML IJ SOLN
100.0000 mg | Freq: Every day | INTRAMUSCULAR | Status: DC
  Administered 2016-05-06 – 2016-05-11 (×6): 100 mg via INTRAVENOUS
  Filled 2016-05-06 (×6): qty 2

## 2016-05-06 MED ORDER — SODIUM CHLORIDE 0.9 % IV SOLN
8.0000 mg | Freq: Three times a day (TID) | INTRAVENOUS | Status: DC
  Administered 2016-05-06 – 2016-05-11 (×15): 8 mg via INTRAVENOUS
  Filled 2016-05-06 (×22): qty 4

## 2016-05-06 MED ORDER — SODIUM CHLORIDE 0.9% FLUSH: 10.0000 mL | Freq: Two times a day (BID) | INTRAVENOUS | Status: DC

## 2016-05-06 MED ORDER — POTASSIUM CHLORIDE 20 MEQ/15ML (10%) PO SOLN
40.0000 meq | Freq: Once | ORAL | Status: DC
  Filled 2016-05-06: qty 30

## 2016-05-06 MED ORDER — SODIUM CHLORIDE 0.9 % IV BOLUS (SEPSIS)
250.0000 mL | Freq: Once | INTRAVENOUS | Status: AC
  Administered 2016-05-06: 250 mL via INTRAVENOUS

## 2016-05-06 MED ORDER — POTASSIUM CHLORIDE 10 MEQ/100ML IV SOLN
10.0000 meq | INTRAVENOUS | Status: AC
  Administered 2016-05-06: 10 meq via INTRAVENOUS
  Filled 2016-05-06: qty 100

## 2016-05-06 MED ORDER — SODIUM PHOSPHATES 45 MMOLE/15ML IV SOLN
30.0000 mmol | Freq: Once | INTRAVENOUS | Status: AC
  Administered 2016-05-06: 30 mmol via INTRAVENOUS
  Filled 2016-05-06: qty 10

## 2016-05-06 MED ORDER — POTASSIUM CHLORIDE CRYS ER 20 MEQ PO TBCR: 40.0000 meq | EXTENDED_RELEASE_TABLET | Freq: Once | ORAL | Status: DC

## 2016-05-06 MED ORDER — MAGNESIUM SULFATE 4 GM/100ML IV SOLN
4.0000 g | Freq: Once | INTRAVENOUS | Status: AC
  Administered 2016-05-06: 4 g via INTRAVENOUS
  Filled 2016-05-06: qty 100

## 2016-05-06 MED ORDER — ENSURE ENLIVE PO LIQD
237.0000 mL | Freq: Two times a day (BID) | ORAL | Status: DC
  Administered 2016-05-07 – 2016-05-11 (×9): 237 mL via ORAL

## 2016-05-06 MED ORDER — METHYLPREDNISOLONE SODIUM SUCC 40 MG IJ SOLR
40.0000 mg | INTRAMUSCULAR | Status: DC
  Administered 2016-05-06 – 2016-05-07 (×2): 40 mg via INTRAVENOUS
  Filled 2016-05-06 (×2): qty 1

## 2016-05-06 MED ORDER — MAGIC MOUTHWASH W/LIDOCAINE
10.0000 mL | Freq: Four times a day (QID) | ORAL | Status: DC | PRN
  Filled 2016-05-06: qty 10

## 2016-05-06 MED ORDER — PROMETHAZINE HCL 25 MG/ML IJ SOLN: 12.5000 mg | Freq: Four times a day (QID) | INTRAMUSCULAR | Status: DC | PRN

## 2016-05-06 MED ORDER — CLOTRIMAZOLE 10 MG MT TROC
10.0000 mg | Freq: Every day | OROMUCOSAL | Status: DC
  Administered 2016-05-06 – 2016-05-11 (×26): 10 mg via ORAL
  Filled 2016-05-06 (×31): qty 1

## 2016-05-06 MED ORDER — SODIUM CHLORIDE 0.9% FLUSH
10.0000 mL | INTRAVENOUS | Status: DC | PRN
  Administered 2016-05-10: 10 mL
  Filled 2016-05-06: qty 40

## 2016-05-06 NOTE — Progress Notes (Signed)
Peripherally Inserted Central Catheter/Midline Placement  The IV Nurse has discussed with the patient and/or persons authorized to consent for the patient, the purpose of this procedure and the potential benefits and risks involved with this procedure.  The benefits include less needle sticks, lab draws from the catheter, ability to perform PICC exchange if ordered by the physician and patient may be discharged home with the catheter.  Risks include, but not limited to, infection, bleeding, blood clot (thrombus formation), and puncture of an artery; nerve damage and irregular heart beat.  Alternatives to this procedure were also discussed.  Father signed the consent per pt verbal request due to mobility issues/unable to sign. PT, father, mother and brother at bedside agreeable to procedure.  Bard educational information left at bedside  PICC/Midline Placement Documentation  PICC Double Lumen 10/09/70 PICC Right Basilic 31 cm 0 cm (Active)  Indication for Insertion or Continuance of Line Prolonged intravenous therapies;Limited venous access - need for IV therapy >5 days (PICC only);Poor Vasculature-patient has had multiple peripheral attempts or PIVs lasting less than 24 hours 05/06/2016  4:17 PM  Exposed Catheter (cm) 0 cm 05/06/2016  4:17 PM  Site Assessment Clean;Dry;Intact 05/06/2016  4:17 PM  Lumen #1 Status Flushed;Saline locked;Blood return noted 05/06/2016  4:17 PM  Lumen #2 Status Flushed;Saline locked;Blood return noted 05/06/2016  4:17 PM  Dressing Type Transparent 05/06/2016  4:17 PM  Dressing Status Clean;Dry;Intact;Antimicrobial disc in place 05/06/2016  4:17 PM  Line Care Connections checked and tightened 05/06/2016  4:17 PM  Line Adjustment (NICU/IV Team Only) No 05/06/2016  4:17 PM  Dressing Intervention New dressing 05/06/2016  4:17 PM  Dressing Change Due 05/13/16 05/06/2016  4:17 PM       Rolena Infante 05/06/2016, 4:18 PM

## 2016-05-06 NOTE — Evaluation (Signed)
Clinical/Bedside Swallow Evaluation Patient Details  Name: Peter Hayes MRN: [id number] Date of Birth: 10-Jan-1996  Today's Date: 05/06/2016 Time: SLP Start Time (ACUTE ONLY): 0955 SLP Stop Time (ACUTE ONLY): 1008 SLP Time Calculation (min) (ACUTE ONLY): 13 min  Past Medical History:  Past Medical History:  Diagnosis Date  . MD (muscular dystrophy) [facility])    Past Surgical History: History reviewed. No pertinent surgical history. HPI:  Pt is a 20 y.o. male with PMH of muscular dystrophy and wheelchair-bound, admitted 7/28 with worsening nausea, sore throat, rhinorrhea, and mild diarrhea. Pt normally eats a regular diet. CXR clear, abdominal CT indicating possible adynamic ileus. Pt was evaluated 7/29 via bedside swallow, was noted to have decreased lingual strength, delayes in mastication/ oral manipulation/ transit of bolus, dysphagia 3 diet recommended. Swallow eval re-ordered, order stating "pt drooling and unable to swallow today".   Assessment / Plan / Recommendation Clinical Impression  Pt continues to present with an oral dysphagia with prolonged oral phase/ reduced lingual movement on trials of puree consistencies; however, no overt s/s of aspiration. Also note that pt taking very small bites of puree at a time. RN reported that this a.m. pt was unable to swallow meds crushed in puree and began drooling after attempt. No drooling or facial weakness noted during this evaluation. Pt did not eat breakfast this a.m. due to nausea- offered to pt but pt declined. Discussed possible diet options- pt agrees that full liquid diet may be beneficial given current nausea. Recommend downgrading diet to full liquid, full supervision during meals to assist with feeding, meds whole with liquid or crushed in puree if unable, ensure pt sitting upright and would also recommend that pt sit upright 30 mins after meal. Will continue to follow to ensure diet tolerance/ consider advancement; however, it continues to  appear that nausea is the primary factor for decreased intake.     Aspiration Risk  Mild aspiration risk    Diet Recommendation Thin liquid (full liquid)   Liquid Administration via: Straw Medication Administration: Whole meds with liquid Supervision: Staff to assist with self feeding;Full supervision/cueing for compensatory strategies Compensations: Slow rate;Small sips/bites;Follow solids with liquid Postural Changes: Seated upright at 90 degrees;Remain upright for at least 30 minutes after po intake    Other  Recommendations Recommended Consults: Consider GI evaluation Oral Care Recommendations: Oral care BID Other Recommendations: Clarify dietary restrictions   Follow up Recommendations   (TBD)    Frequency and Duration min 2x/week  1 week       Prognosis Prognosis for Safe Diet Advancement: Good      Swallow Study   General HPI: Pt is a 20 y.o. male with PMH of muscular dystrophy and wheelchair-bound, admitted 7/28 with worsening nausea, sore throat, rhinorrhea, and mild diarrhea. Pt normally eats a regular diet. CXR clear, abdominal CT indicating possible adynamic ileus. Pt was evaluated 7/29 via bedside swallow, was noted to have decreased lingual strength, delayes in mastication/ oral manipulation/ transit of bolus, dysphagia 3 diet recommended. Swallow eval re-ordered, order stating "pt drooling and unable to swallow today". Type of Study: Bedside Swallow Evaluation Previous Swallow Assessment: previous date- D3/ thin liquid rec'd Diet Prior to this Study: Dysphagia 3 (soft);Thin liquids Temperature Spikes Noted: No Respiratory Status: Room air History of Recent Intubation: No Behavior/Cognition: Alert;Cooperative;Pleasant mood Oral Cavity Assessment: Within Functional Limits Oral Care Completed by SLP: No Oral Cavity - Dentition: Adequate natural dentition Vision: Functional for self-feeding Patient Positioning: Upright in bed Baseline Vocal Quality: Normal;Low  vocal intensity    Oral/Motor/Sensory Function Overall Oral Motor/Sensory Function: Mild impairment Lingual Strength: Reduced   Ice Chips Ice chips: Not tested   Thin Liquid Thin Liquid: Impaired Presentation: Straw Pharyngeal  Phase Impairments: Multiple swallows    Nectar Thick Nectar Thick Liquid: Not tested   Honey Thick Honey Thick Liquid: Not tested   Puree Puree: Impaired Presentation: Spoon Oral Phase Impairments: Reduced lingual movement/coordination Oral Phase Functional Implications: Prolonged oral transit Pharyngeal Phase Impairments: Multiple swallows   Solid   GO   Solid: Not tested        Blair Heys K, MA, CCC-SLP 05/06/2016,10:18 AM [phone]

## 2016-05-06 NOTE — Progress Notes (Signed)
CRITICAL VALUE ALERT  Critical value received:  Potassium 2.3  Date of notification:  05/06/16  Time of notification:  0400  Critical value read back:YES  Nurse who received alert: Sherryl Manges, RN, BSN   MD notified (1st page):  [place]  Time of first page:  [phone]  MD notified (2nd page):  Time of second page:  Responding MD:    Time MD responded:

## 2016-05-06 NOTE — Progress Notes (Signed)
VC  .7L and Negative IF -17cmH2O  Good effort

## 2016-05-06 NOTE — Progress Notes (Signed)
PROGRESS NOTE  Peter Hayes  UTM:[id number] DOB: 10/01/1996 DOA: 05/04/2016 PCP: Pcp Not In System  Brief Narrative:   Peter Hayes is a 20 y.o. male with significant past medical history muscular dystrophy and wheelchair-bound who presented to [facility] complaining of sudden onset and gradual worsening nausea, sore throat, rhinorrhea, as well as mild diarrhea.  His parents felt that he appeared to be breathing fast so they brought him to the ER.  [name] are his caretakers.  Other than nausea, the patient stated that he was overall feeling better.  Due to concern for sepsis, he was given vancomycin and zosyn and 4L NS.  He has remained tachycardic and has severe electrolyte imbalance.    Assessment & Plan:   Principal Problem:   Sepsis ([place]) Active Problems:   Muscular dystrophy ([place])   Moderate protein-calorie malnutrition ([facility])   Tachycardia   Leukocytosis   High anion gap metabolic acidosis   Respiratory alkalosis  Sepsis due to probable gastroenteritis suggested by "adynamic ileus" on CT despite hyperactive BS on exam.  CXR and UA unremarkable.  Patient does not have ileus, but has distended bowels secondary to diarrhea and possibly due to hypokalemia/hypophosphatemia.   -  D/c vancomycin -  Continue zosyn until cultures are negative for more than 24 hours -  F/u blood culture:  NGTD -  Rapid strep negative -  Monospot negative -  Procalcitonin negative -  Schedule zofran and add phenergan prn -  Replete electrolytes  Anion gap metabolic acidosis due to dehydration and diarrhea, resolving with IVF -  Repeat BMP in AM  Severe protein calorie malnutrition due to acute illness -  Nutrition consultation -  Supplements  Hypokalemia, hypomagnesemia, and hypophosphatemia suggest chronic malnutrition vs. Acute losses.  Unable to tolerate much PO currently so will administer IV -  IV KCl -  IV magnesium 4gm -  IV sodium phos 31mmol -  Repeat electrolytes in  AM  Tachycardia suggests ongoing dehydration (already given more than 5L since admission suggesting much more severe dehydration than suggested by the initial history).  Doubt PE.  TSH wnl.  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   -  Continue IVF (will get additional fluids from sodium phos) -  ECHO   Muscular dystrophy with difficulty swallowing, worsened weakness probably due to viral illness -  Steroid burst x 3 doses -  PT evaluation when feeling better -  NIF x 1   Sore throat with denuded soft palate/possible thrush vs. Irritation from vomiting.  Per RN, drooling and not tolerating PO this morning -  Clotrimazole -  Magic mouthwash trial -  Appreciate SLP reevaluation  DVT prophylaxis:  lovenox Code Status:  full Family Communication:  Patient and his father at bedside.   Disposition Plan:  Pending tolerating diet   Consultants:   SLP  Procedures:  none  Antimicrobials:   Vancomycin 7/29 > 7/30  Zosyn 7/29   Subjective: Denies pain. Having some throat discomfort but not painful currently.  Difficult to swallow.  Having ongoing nausea.    Objective: Vitals:   05/05/16 1607 05/05/16 2010 05/05/16 2339 05/06/16 0400  BP: (!) 98/51 120/67 (!) 102/46 (!) 97/53  Pulse: (!) 119 (!) 121 (!) 124 (!) 104  Resp: 19 16 (!) 21 15  Temp: 99.2 F (37.3 C) 97.6 F (36.4 C) 98.1 F (36.7 C) 98.3 F (36.8 C)  TempSrc: Oral Oral Oral Oral  SpO2:  98% 98% 98%  Weight:  Height:        Intake/Output Summary (Last 24 hours) at 05/06/16 1124 Last data filed at 05/06/16 0900  Gross per 24 hour  Intake             1650 ml  Output              900 ml  Net              750 ml   Filed Weights   05/04/16 2244 05/05/16 0646  Weight: 49.9 kg (110 lb) 37.7 kg (83 lb 1.8 oz)    Examination:  General exam:  Adult male, round face.  No acute distress.  HEENT:  NCAT, MMM but soft palate with well-demarcated area of salmon-pink and possible white plaque Respiratory system:  Clear to auscultation bilaterally Cardiovascular system:  Tachycardic, regular rhythm, normal S1/S2. No murmurs, rubs, gallops or clicks.  Warm extremities Gastrointestinal system: scaphoid abdomen.  Hyperactive bowel sounds, soft, nondistended, nontender. MSK:  Decreased tone and bulk, left hand somewhat contracted and both feet are contracted.  No lower extremity edema Neuro:  Diffusely weak, but able to sit at the bedside    Data Reviewed: I have personally reviewed following labs and imaging studies  CBC:  Recent Labs Lab 05/05/16 0030 05/06/16 0228  WBC 29.1* 8.1  NEUTROABS 27.0*  --   HGB 15.3 12.9*  HCT 45.5 39.2  MCV 88.5 88.1  PLT 285 976   Basic Metabolic Panel:  Recent Labs Lab 05/05/16 0030 05/06/16 0228  NA 137 138  K 3.5 2.3*  CL 104 111  CO2 12* 19*  GLUCOSE 55* 114*  BUN 10 <5*  CREATININE <0.30* <0.30*  CALCIUM 9.8 8.6*  MG  --  1.5*  PHOS  --  1.4*   GFR: CrCl cannot be calculated (This lab value cannot be used to calculate CrCl because it is not a number: <0.30). Liver Function Tests:  Recent Labs Lab 05/05/16 0030  AST 38  ALT 27  ALKPHOS 77  BILITOT 2.3*  PROT 8.0  ALBUMIN 5.0   No results for input(s): LIPASE, AMYLASE in the last 168 hours. No results for input(s): AMMONIA in the last 168 hours. Coagulation Profile: No results for input(s): INR, PROTIME in the last 168 hours. Cardiac Enzymes: No results for input(s): CKTOTAL, CKMB, CKMBINDEX, TROPONINI in the last 168 hours. BNP (last 3 results) No results for input(s): PROBNP in the last 8760 hours. HbA1C: No results for input(s): HGBA1C in the last 72 hours. CBG:  Recent Labs Lab 05/05/16 0304  GLUCAP 202*   Lipid Profile: No results for input(s): CHOL, HDL, LDLCALC, TRIG, CHOLHDL, LDLDIRECT in the last 72 hours. Thyroid Function Tests:  Recent Labs  05/05/16 1338  TSH 1.583   Anemia Panel: No results for input(s): VITAMINB12, FOLATE, FERRITIN, TIBC, IRON,  RETICCTPCT in the last 72 hours. Urine analysis:    Component Value Date/Time   COLORURINE YELLOW 05/05/2016 0236   APPEARANCEUR CLEAR 05/05/2016 0236   LABSPEC 1.019 05/05/2016 0236   PHURINE 5.5 05/05/2016 0236   GLUCOSEU 500 (A) 05/05/2016 0236   HGBUR NEGATIVE 05/05/2016 0236   BILIRUBINUR SMALL (A) 05/05/2016 0236   KETONESUR >80 (A) 05/05/2016 0236   PROTEINUR NEGATIVE 05/05/2016 0236   NITRITE NEGATIVE 05/05/2016 0236   LEUKOCYTESUR NEGATIVE 05/05/2016 0236   Sepsis Labs: $[email or web address]$ (procalcitonin:4,lacticidven:4)  ) Recent Results (from the past 240 hour(s))  Blood Culture (routine x 2)     Status: None (Preliminary result)   Collection  Time: 05/05/16 12:25 AM  Result Value Ref Range Status   Specimen Description BLOOD NECK  Final   Special Requests   Final    BOTTLES DRAWN AEROBIC AND ANAEROBIC 5CC Performed at [facility]    Culture PENDING  Incomplete   Report Status PENDING  Incomplete  Urine culture     Status: None   Collection Time: 05/05/16  2:36 AM  Result Value Ref Range Status   Specimen Description URINE, RANDOM  Final   Special Requests NONE  Final   Culture NO GROWTH Performed at [facility]   Final   Report Status 05/06/2016 FINAL  Final  MRSA PCR Screening     Status: None   Collection Time: 05/05/16  6:51 AM  Result Value Ref Range Status   MRSA by PCR NEGATIVE NEGATIVE Final    Comment:        The GeneXpert MRSA Assay (FDA approved for NASAL specimens only), is one component of a comprehensive MRSA colonization surveillance program. It is not intended to diagnose MRSA infection nor to guide or monitor treatment for MRSA infections.   Rapid strep screen (not at [facility])     Status: None   Collection Time: 05/05/16 10:33 AM  Result Value Ref Range Status   Streptococcus, Group A Screen (Direct) NEGATIVE NEGATIVE Final    Comment: (NOTE) A Rapid Antigen test may result negative if the antigen level in the sample is below  the detection level of this test. The FDA has not cleared this test as a stand-alone test therefore the rapid antigen negative result has reflexed to a Group A Strep culture.       Radiology Studies: Ct Abdomen Pelvis Wo Contrast  Result Date: 05/05/2016 CLINICAL DATA:  20 year old male with history of muscular dystrophy presenting with fever. Evaluate for pneumoperitoneum. EXAM: CT ABDOMEN AND PELVIS WITHOUT CONTRAST TECHNIQUE: Multidetector CT imaging of the abdomen and pelvis was performed following the standard protocol without IV contrast. COMPARISON:  Chest radiograph dated 05/04/2016 FINDINGS: Evaluation of this exam is limited in the absence of intravenous contrast. The visualized lung bases are clear. No intra-abdominal free air or free fluid. Diffuse fatty infiltration of the liver. The gallbladder is not well visualized, likely contracted. No calcified stone identified. The pancreas, spleen, adrenal glands, kidneys, visualized ureters, and urinary bladder appear unremarkable. The prostate and seminal vesicles are grossly unremarkable. Evaluation of the bowel is limited in the absence of oral contrast. There is air distention of the colon and rectosigmoid without evidence of mechanical obstruction. There is no evidence of small bowel obstruction or active inflammation. Normal appendix. The abdominal aorta and IVC appear unremarkable on this noncontrast study. No portal venous gas identified. There is no adenopathy. There is diffuse fatty replacement of the musculature compatible with known muscular dystrophy. The bones are osteopenic. There is scoliosis of the spine. Chronic left sacroiliitis. Vacuum phenomena. No acute fracture. IMPRESSION: No acute intra-abdominal pelvic pathology.  No free air. Air distal colon and rectosigmoid without evidence of mechanical obstruction. Findings may represent an adynamic ileus. Clinical correlation is recommended. Electronically Signed   By: Anner Crete  M.D.   On: 05/05/2016 02:00  Dg Chest 2 View  Result Date: 05/05/2016 CLINICAL DATA:  20 year old male with fever EXAM: CHEST  2 VIEW COMPARISON:  None FINDINGS: The lungs are clear. There is no pleural effusion or pneumothorax. The cardiac silhouette is within normal limits. No acute osseous pathology identified. Air distended loop of colon noted  under the left hemidiaphragm. There is air in the upper abdomen outlining both sides of the colonic wall. Although this likely represents air within adjacent distended bowel, pneumoperitoneum is not excluded. Correlation with clinical exam is recommended. If there is clinical concern for pneumoperitoneum CT is recommended for further evaluation. IMPRESSION: No active cardiopulmonary disease. Air distended loops of bowel in the upper abdomen. Pneumoperitoneum is not excluded. Correlation with clinical exam recommended. CT may provide better evaluation if there is clinical concern for pneumoperitoneum. Electronically Signed   By: Anner Crete M.D.   On: 05/05/2016 01:03    Scheduled Meds: . clotrimazole  10 mg Oral 5 X Daily  . enoxaparin (LOVENOX) injection  30 mg Subcutaneous Q24H  . feeding supplement (ENSURE ENLIVE)  237 mL Oral BID BM  . ketorolac  30 mg Intravenous Once  . LORazepam  1 mg Oral Once  . methylPREDNISolone (SOLU-MEDROL) injection  40 mg Intravenous Q24H  . ondansetron (ZOFRAN) IV  8 mg Intravenous Q8H  . piperacillin-tazobactam (ZOSYN)  IV  3.375 g Intravenous Q8H  . potassium chloride  10 mEq Intravenous Q1 Hr x 6  . sodium chloride flush  3 mL Intravenous Q12H  . vancomycin  500 mg Intravenous Q12H   Continuous Infusions: . dextrose 5 % and 0.45 % NaCl with KCl 10 mEq/L 75 mL/hr at 05/05/16 2347     LOS: 1 day    Time spent: 30 min    Janece Canterbury, MD [facility] Pager [phone]  If 7PM-7AM, please contact night-coverage [email or web address] Password [email or web address] 05/06/2016, 11:24 AM

## 2016-05-06 NOTE — Progress Notes (Signed)
PHARMACY - PHYSICIAN COMMUNICATION CRITICAL VALUE ALERT - BLOOD CULTURE IDENTIFICATION (BCID)  Results for orders placed or performed during the hospital encounter of 05/04/16  Blood Culture ID Panel (Reflexed) (Collected: 05/05/2016 12:25 AM)  Result Value Ref Range   Enterococcus species NOT DETECTED NOT DETECTED   Vancomycin resistance NOT DETECTED NOT DETECTED   Listeria monocytogenes NOT DETECTED NOT DETECTED   Staphylococcus species DETECTED (A) NOT DETECTED   Staphylococcus aureus NOT DETECTED NOT DETECTED   Methicillin resistance NOT DETECTED NOT DETECTED   Streptococcus species NOT DETECTED NOT DETECTED   Streptococcus agalactiae NOT DETECTED NOT DETECTED   Streptococcus pneumoniae NOT DETECTED NOT DETECTED   Streptococcus pyogenes NOT DETECTED NOT DETECTED   Acinetobacter baumannii NOT DETECTED NOT DETECTED   Enterobacteriaceae species NOT DETECTED NOT DETECTED   Enterobacter cloacae complex NOT DETECTED NOT DETECTED   Escherichia coli NOT DETECTED NOT DETECTED   Klebsiella oxytoca NOT DETECTED NOT DETECTED   Klebsiella pneumoniae NOT DETECTED NOT DETECTED   Proteus species NOT DETECTED NOT DETECTED   Serratia marcescens NOT DETECTED NOT DETECTED   Carbapenem resistance NOT DETECTED NOT DETECTED   Haemophilus influenzae NOT DETECTED NOT DETECTED   Neisseria meningitidis NOT DETECTED NOT DETECTED   Pseudomonas aeruginosa NOT DETECTED NOT DETECTED   Candida albicans NOT DETECTED NOT DETECTED   Candida glabrata NOT DETECTED NOT DETECTED   Candida krusei NOT DETECTED NOT DETECTED   Candida parapsilosis NOT DETECTED NOT DETECTED   Candida tropicalis NOT DETECTED NOT DETECTED    Name of physician (or Provider) Contacted: Dr. Sheran Fava via text page.  Changes to prescribed antibiotics required: Continue Zosyn.  This likely represents contamination so do not recommend any antibiotic modifications based on this result.  Norva Riffle 05/06/2016  5:15 PM

## 2016-05-06 NOTE — Progress Notes (Signed)
VC: 0.8L and NIF: -16. Pt provided good effort.

## 2016-05-06 NOTE — Progress Notes (Signed)
Pt had minimal output during the night. Only 400cc from 1900-0400 was recorded. RN performed bladder scan. Only 75cc was found in the bladder. [facility] on call notified. Will continue to monitor.

## 2016-05-07 ENCOUNTER — Inpatient Hospital Stay (HOSPITAL_COMMUNITY): Payer: 59

## 2016-05-07 DIAGNOSIS — I428 Other cardiomyopathies: Secondary | ICD-10-CM

## 2016-05-07 LAB — MAGNESIUM: Magnesium: 2 mg/dL (ref 1.7–2.4)

## 2016-05-07 LAB — CBC
HCT: 35.8 % — ABNORMAL LOW (ref 39.0–52.0)
HEMOGLOBIN: 11.8 g/dL — AB (ref 13.0–17.0)
MCH: 28.7 pg (ref 26.0–34.0)
MCHC: 33 g/dL (ref 30.0–36.0)
MCV: 87.1 fL (ref 78.0–100.0)
Platelets: 142 10*3/uL — ABNORMAL LOW (ref 150–400)
RBC: 4.11 MIL/uL — AB (ref 4.22–5.81)
RDW: 13.8 % (ref 11.5–15.5)
WBC: 5.5 10*3/uL (ref 4.0–10.5)

## 2016-05-07 LAB — RENAL FUNCTION PANEL
ALBUMIN: 3.2 g/dL — AB (ref 3.5–5.0)
ANION GAP: 6 (ref 5–15)
CALCIUM: 7.9 mg/dL — AB (ref 8.9–10.3)
CO2: 24 mmol/L (ref 22–32)
Chloride: 104 mmol/L (ref 101–111)
Creatinine, Ser: <0.3 mg/dL — ABNORMAL LOW (ref 0.61–1.24)
Glucose, Bld: 287 mg/dL — ABNORMAL HIGH (ref 65–99)
PHOSPHORUS: 1.5 mg/dL — AB (ref 2.5–4.6)
POTASSIUM: 3.2 mmol/L — AB (ref 3.5–5.1)
SODIUM: 134 mmol/L — AB (ref 135–145)

## 2016-05-07 LAB — ECHOCARDIOGRAM COMPLETE
Height: 63 in
Weight: 1329.81 oz

## 2016-05-07 LAB — CULTURE, GROUP A STREP (THRC)

## 2016-05-07 LAB — CK: Total CK: 202 U/L (ref 49–397)

## 2016-05-07 MED ORDER — PERFLUTREN LIPID MICROSPHERE
INTRAVENOUS | Status: AC
  Filled 2016-05-07: qty 10

## 2016-05-07 MED ORDER — POTASSIUM CHLORIDE 10 MEQ/100ML IV SOLN
10.0000 meq | Freq: Once | INTRAVENOUS | Status: DC
  Filled 2016-05-07: qty 100

## 2016-05-07 MED ORDER — PERFLUTREN LIPID MICROSPHERE
1.0000 mL | INTRAVENOUS | Status: AC | PRN
  Administered 2016-05-07: 2 mL via INTRAVENOUS

## 2016-05-07 MED ORDER — MAGNESIUM SULFATE IN D5W 1-5 GM/100ML-% IV SOLN
1.0000 g | Freq: Once | INTRAVENOUS | Status: AC
  Administered 2016-05-07: 1 g via INTRAVENOUS
  Filled 2016-05-07: qty 100

## 2016-05-07 MED ORDER — SODIUM PHOSPHATES 45 MMOLE/15ML IV SOLN
30.0000 mmol | Freq: Once | INTRAVENOUS | Status: AC
  Administered 2016-05-07: 30 mmol via INTRAVENOUS
  Filled 2016-05-07: qty 10

## 2016-05-07 MED ORDER — POTASSIUM CHLORIDE 10 MEQ/100ML IV SOLN
10.0000 meq | INTRAVENOUS | Status: AC
  Administered 2016-05-07 (×5): 10 meq via INTRAVENOUS
  Filled 2016-05-07 (×5): qty 100

## 2016-05-07 NOTE — Progress Notes (Addendum)
Initial Nutrition Assessment  DOCUMENTATION CODES:   Underweight  INTERVENTION:    Continue Ensure Enlive po BID, each supplement provides 350 kcal and 20 grams of protein   Continue Boost Breeze po daily, each supplement provides 250 kcal and 9 grams of protein  NUTRITION DIAGNOSIS:   Increased nutrient needs related to acute illness as evidenced by estimated needs  GOAL:   Patient will meet greater than or equal to 90% of their needs  MONITOR:   PO intake, Supplement acceptance, Labs, Weight trends, I & O's  REASON FOR ASSESSMENT:   Consult Assessment of nutrition requirement/status  ASSESSMENT:   20 y.o.Malewith significant PMH of muscular dystrophy and wheelchair-bound who [facility] onset and gradual worsening nausea, sore throat, rhinorrhea, as well as mild diarrhea.  His parents felt that he appeared to be breathing fast so they brought him to the ER.  [name] are his caretakers.  Other than nausea, the patient stated that he was overall feeling better.  Due to concern for sepsis, he was given vancomycin and zosyn and 4L NS.  He has remained tachycardic and has severe electrolyte imbalance.    RD spoke with patient's brother. Reports pt typically eats well at home and drinks oral nutrition supplements. Has Ensure Enlive ordered, however, pt has been feeling nauseous and therefore hasn't been drinking it. Family is unsure if pt has lost weight. Nutrition Focused Physical Exam not applicable given pt muscle loss disease.  Diet Order:  Diet full liquid Room service appropriate? Yes; Fluid consistency: Thin  Skin:  Reviewed, no issues  Last BM:  7/30  Height:   Ht Readings from Last 1 Encounters:  05/04/16 $RemoveB'5\' 3"'KfHNeUdO$  (1.6 m)    Weight:   Wt Readings from Last 1 Encounters:  05/05/16 83 lb 1.8 oz (37.7 kg)    Ideal Body Weight:  56.3 kg  BMI:  Body mass index is 14.72 kg/m.  Estimated Nutritional Needs:    Kcal:  1000-1200  Protein:  50-60 gm  Fluid:  >/= 1.5 L  EDUCATION NEEDS:   No education needs identified at this time  Arthur Holms, RD, LDN Pager #: [phone] After-Hours Pager #: [phone]

## 2016-05-07 NOTE — Progress Notes (Signed)
  Echocardiogram 2D Echocardiogram  With Definity has been performed.  Peter Hayes 05/07/2016, 2:19 PM

## 2016-05-07 NOTE — Progress Notes (Signed)
Speech Language Pathology Treatment: Dysphagia  Patient Details Name: Peter Hayes MRN: [id number] DOB: November 24, 1995 Today's Date: 05/07/2016 Time: 9824-2998 SLP Time Calculation (min) (ACUTE ONLY): 13 min  Assessment / Plan / Recommendation Clinical Impression  Pt seen for dysphagia followup. Pt reports feeling back to baseline re: swallow function. Pt was able to tolerate trials of dry solid, puree and thin via straw without difficulty. Pt took small bites/sips for independent management of function. Recommend: Upgrade to Dys 3 with thin. No further SLP services warranted at this time. Pt and family in agreement with POC. Will signoff at this time.    HPI HPI: Pt is a 20 y.o. male with PMH of muscular dystrophy and wheelchair-bound, admitted 7/28 with worsening nausea, sore throat, rhinorrhea, and mild diarrhea. Pt normally eats a regular diet. CXR clear, abdominal CT indicating possible adynamic ileus. Pt was evaluated 7/29 via bedside swallow, was noted to have decreased lingual strength, delayes in mastication/ oral manipulation/ transit of bolus, dysphagia 3 diet recommended. Swallow eval re-ordered, order stating "pt drooling and unable to swallow today".      SLP Plan  All goals met;Discharge SLP treatment due to (comment)     Recommendations  Diet recommendations: Dysphagia 3 (mechanical soft);Thin liquid Liquids provided via: Straw Medication Administration: Whole meds with liquid Supervision: Staff to assist with self feeding Compensations: Slow rate;Small sips/bites;Follow solids with liquid Postural Changes and/or Swallow Maneuvers: Seated upright 90 degrees             Oral Care Recommendations: Oral care BID Follow up Recommendations: None Plan: All goals met;Discharge SLP treatment due to (comment)     Bertram MA, CCC-SLP 05/07/2016, 11:56 AM

## 2016-05-07 NOTE — Care Management Important Message (Signed)
Important Message  Patient Details  Name: Peter Hayes MRN: [id number] Date of Birth: 1996-04-15   Medicare Important Message Given:  Yes    Loann Quill 05/07/2016, 3:34 PM

## 2016-05-07 NOTE — Progress Notes (Signed)
Patient able to get NIF -20 and VC 650

## 2016-05-07 NOTE — Progress Notes (Signed)
PROGRESS NOTE  Niel Peretti  ZJI:[id number] DOB: 1996/05/23 DOA: 05/04/2016 PCP: Pcp Not In System  Brief Narrative:   Peter Hayes is a 20 y.o. male with significant past medical history muscular dystrophy and wheelchair-bound who presented to [facility] ER complaining of sudden onset and gradual worsening nausea, sore throat, rhinorrhea, as well as mild diarrhea.  His parents felt that he appeared to be breathing fast so they brought him to the ER.  [name] are his caretakers.  Other than nausea, the patient stated that he was overall feeling better.  Due to concern for sepsis, he was given vancomycin and zosyn and 4L NS.  He has remained tachycardic and has severe electrolyte imbalance.    Assessment & Plan:   Principal Problem:   Sepsis ([place]) Active Problems:   Muscular dystrophy ([place])   Moderate protein-calorie malnutrition ([facility])   Tachycardia   Leukocytosis   High anion gap metabolic acidosis   Respiratory alkalosis  Sepsis due to probable gastroenteritis suggested by "adynamic ileus" on CT despite hyperactive BS on exam.  CXR and UA unremarkable.  Patient does not have ileus, but has distended bowels secondary to diarrhea and possibly due to hypokalemia/hypophosphatemia.   -  Antibiotics discontinued on 7/30  -  Blood culture:  S. Epidermidus, likely contaminate -  Rapid strep negative -  Monospot negative -  Procalcitonin negative -  Continue scheduled zofran and phenergan prn -  Replete electrolytes  Anion gap metabolic acidosis due to dehydration and diarrhea, resolved with IVF -  Repeat BMP in AM  Severe protein calorie malnutrition due to acute illness -  Nutrition consultation -  Supplements  Hypokalemia, hypomagnesemia, and hypophosphatemia suggest chronic malnutrition vs. Acute losses.  Unable to tolerate much PO currently so will administer IV -  IV KCl -  IV magnesium 1gm -  IV sodium phos 30mmol -  Repeat electrolytes in AM  Tachycardia suggests  ongoing dehydration (already given more than 5L since admission suggesting much more severe dehydration than suggested by the initial history).  Doubt PE.  TSH wnl.  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   -  Continue IVF (will get additional fluids from sodium phos) -  ECHO pending  Muscular dystrophy with difficulty swallowing, worsened weakness probably due to viral illness -  Steroid burst x 3 doses -  PT evaluation when feeling better -  NIF x 1:  -17cmH2O and VC 0.7L  Sore throat with denuded soft palate/possible thrush vs. Irritation from vomiting.  Per RN, drooling and not tolerating PO this morning -  Clotrimazole -  Magic mouthwash trial -  Appreciate SLP reevaluation  Mild anemia and thrombocytopenia are likely hemodilutional.   -  Repeat CBC in AM  DVT prophylaxis:  lovenox Code Status:  full Family Communication:  Patient and his brother at bedside Disposition Plan:  Pending tolerating diet and electrolytes replaced   Consultants:   SLP  Procedures:  none  Antimicrobials:   Vancomycin 7/29 > 7/30  Zosyn 7/29   Subjective: Denies pain and nausea has improved.  Feels like he may be able to eat today.     Objective: Vitals:   05/06/16 2000 05/07/16 0000 05/07/16 0400 05/07/16 0800  BP: 120/75 122/64 (!) 96/54   Pulse: (!) 118 (!) 111 97   Resp: $Remo'18 20 18   'ISgdw$ Temp: 99.3 F (37.4 C) 98.1 F (36.7 C) 97.7 F (36.5 C) 97.5 F (36.4 C)  TempSrc: Oral Oral Axillary Oral  SpO2: 99%  98% 98%   Weight:      Height:        Intake/Output Summary (Last 24 hours) at 05/07/16 1133 Last data filed at 05/07/16 0600  Gross per 24 hour  Intake           1379.5 ml  Output             1450 ml  Net            -70.5 ml   Filed Weights   05/04/16 2244 05/05/16 0646  Weight: 49.9 kg (110 lb) 37.7 kg (83 lb 1.8 oz)    Examination:  General exam:  Adult male, round face.  No acute distress.  HEENT:  NCAT, MMM Respiratory system: Clear to auscultation  bilaterally Cardiovascular system:  Tachycardic, regular rhythm, normal S1/S2. No murmurs, rubs, gallops or clicks.  Warm extremities Gastrointestinal system:  Normal active active bowel sounds, soft, nondistended, nontender.  Toy [place] than yesterday MSK:  Decreased tone and bulk, left hand somewhat contracted and both feet are contracted.  No lower extremity edema Neuro:  Diffusely weak, but able to sit at the bedside    Data Reviewed: I have personally reviewed following labs and imaging studies  CBC:  Recent Labs Lab 05/05/16 0030 05/06/16 0228 05/07/16 0434  WBC 29.1* 8.1 5.5  NEUTROABS 27.0*  --   --   HGB 15.3 12.9* 11.8*  HCT 45.5 39.2 35.8*  MCV 88.5 88.1 87.1  PLT 285 164 097*   Basic Metabolic Panel:  Recent Labs Lab 05/05/16 0030 05/06/16 0228 05/07/16 0434  NA 137 138 134*  K 3.5 2.3* 3.2*  CL 104 111 104  CO2 12* 19* 24  GLUCOSE 55* 114* 287*  BUN 10 <5* <5*  CREATININE <0.30* <0.30* <0.30*  CALCIUM 9.8 8.6* 7.9*  MG  --  1.5* 2.0  PHOS  --  1.4* 1.5*   GFR: CrCl cannot be calculated (This lab value cannot be used to calculate CrCl because it is not a number: <0.30). Liver Function Tests:  Recent Labs Lab 05/05/16 0030 05/07/16 0434  AST 38  --   ALT 27  --   ALKPHOS 77  --   BILITOT 2.3*  --   PROT 8.0  --   ALBUMIN 5.0 3.2*   No results for input(s): LIPASE, AMYLASE in the last 168 hours. No results for input(s): AMMONIA in the last 168 hours. Coagulation Profile: No results for input(s): INR, PROTIME in the last 168 hours. Cardiac Enzymes:  Recent Labs Lab 05/07/16 0434  CKTOTAL 202   BNP (last 3 results) No results for input(s): PROBNP in the last 8760 hours. HbA1C: No results for input(s): HGBA1C in the last 72 hours. CBG:  Recent Labs Lab 05/05/16 0304  GLUCAP 202*   Lipid Profile: No results for input(s): CHOL, HDL, LDLCALC, TRIG, CHOLHDL, LDLDIRECT in the last 72 hours. Thyroid Function Tests:  Recent Labs   05/05/16 1338  TSH 1.583   Anemia Panel: No results for input(s): VITAMINB12, FOLATE, FERRITIN, TIBC, IRON, RETICCTPCT in the last 72 hours. Urine analysis:    Component Value Date/Time   COLORURINE YELLOW 05/05/2016 0236   APPEARANCEUR CLEAR 05/05/2016 0236   LABSPEC 1.019 05/05/2016 0236   PHURINE 5.5 05/05/2016 0236   GLUCOSEU 500 (A) 05/05/2016 0236   HGBUR NEGATIVE 05/05/2016 0236   BILIRUBINUR SMALL (A) 05/05/2016 0236   KETONESUR >80 (A) 05/05/2016 0236   PROTEINUR NEGATIVE 05/05/2016 0236   NITRITE NEGATIVE 05/05/2016 0236  LEUKOCYTESUR NEGATIVE 05/05/2016 0236   Sepsis Labs: $RemoveBefo'@LABRCNTIP'ILWPmVYkkKR$ (procalcitonin:4,lacticidven:4)  ) Recent Results (from the past 240 hour(s))  Blood Culture (routine x 2)     Status: Abnormal (Preliminary result)   Collection Time: 05/05/16 12:25 AM  Result Value Ref Range Status   Specimen Description BLOOD NECK  Final   Special Requests BOTTLES DRAWN AEROBIC AND ANAEROBIC 5CC  Final   Culture  Setup Time   Final    GRAM POSITIVE COCCI IN CLUSTERS AEROBIC BOTTLE ONLY CRITICAL RESULT CALLED TO, READ BACK BY AND VERIFIED WITH: M TURNER,PHARMD AT 1710 05/06/16 BY L BENFIELD Performed at [facility] (A)  Final   Report Status PENDING  Incomplete  Blood Culture ID Panel (Reflexed)     Status: Abnormal   Collection Time: 05/05/16 12:25 AM  Result Value Ref Range Status   Enterococcus species NOT DETECTED NOT DETECTED Final   Vancomycin resistance NOT DETECTED NOT DETECTED Final   Listeria monocytogenes NOT DETECTED NOT DETECTED Final   Staphylococcus species DETECTED (A) NOT DETECTED Final    Comment: CRITICAL RESULT CALLED TO, READ BACK BY AND VERIFIED WITH: M TURNER,PHARMD AT 1710 05/06/16 BY L BENFIELD    Staphylococcus aureus NOT DETECTED NOT DETECTED Final   Methicillin resistance NOT DETECTED NOT DETECTED Final   Streptococcus species NOT DETECTED NOT DETECTED Final   Streptococcus agalactiae NOT  DETECTED NOT DETECTED Final   Streptococcus pneumoniae NOT DETECTED NOT DETECTED Final   Streptococcus pyogenes NOT DETECTED NOT DETECTED Final   Acinetobacter baumannii NOT DETECTED NOT DETECTED Final   Enterobacteriaceae species NOT DETECTED NOT DETECTED Final   Enterobacter cloacae complex NOT DETECTED NOT DETECTED Final   Escherichia coli NOT DETECTED NOT DETECTED Final   Klebsiella oxytoca NOT DETECTED NOT DETECTED Final   Klebsiella pneumoniae NOT DETECTED NOT DETECTED Final   Proteus species NOT DETECTED NOT DETECTED Final   Serratia marcescens NOT DETECTED NOT DETECTED Final   Carbapenem resistance NOT DETECTED NOT DETECTED Final   Haemophilus influenzae NOT DETECTED NOT DETECTED Final   Neisseria meningitidis NOT DETECTED NOT DETECTED Final   Pseudomonas aeruginosa NOT DETECTED NOT DETECTED Final   Candida albicans NOT DETECTED NOT DETECTED Final   Candida glabrata NOT DETECTED NOT DETECTED Final   Candida krusei NOT DETECTED NOT DETECTED Final   Candida parapsilosis NOT DETECTED NOT DETECTED Final   Candida tropicalis NOT DETECTED NOT DETECTED Final    Comment: Performed at [facility]  Urine culture     Status: None   Collection Time: 05/05/16  2:36 AM  Result Value Ref Range Status   Specimen Description URINE, RANDOM  Final   Special Requests NONE  Final   Culture NO GROWTH Performed at [facility]   Final   Report Status 05/06/2016 FINAL  Final  MRSA PCR Screening     Status: None   Collection Time: 05/05/16  6:51 AM  Result Value Ref Range Status   MRSA by PCR NEGATIVE NEGATIVE Final    Comment:        The GeneXpert MRSA Assay (FDA approved for NASAL specimens only), is one component of a comprehensive MRSA colonization surveillance program. It is not intended to diagnose MRSA infection nor to guide or monitor treatment for MRSA infections.   Rapid strep screen (not at [facility])     Status: None   Collection Time: 05/05/16 10:33 AM  Result  Value Ref Range Status   Streptococcus, Group A Screen (Direct)  NEGATIVE NEGATIVE Final    Comment: (NOTE) A Rapid Antigen test may result negative if the antigen level in the sample is below the detection level of this test. The FDA has not cleared this test as a stand-alone test therefore the rapid antigen negative result has reflexed to a Group A Strep culture.   Culture, group A strep     Status: None (Preliminary result)   Collection Time: 05/05/16 10:33 AM  Result Value Ref Range Status   Specimen Description THROAT  Final   Special Requests NONE Reflexed from [id number]  Final   Culture CULTURE REINCUBATED FOR BETTER GROWTH  Final   Report Status PENDING  Incomplete      Radiology Studies: No results found.   Scheduled Meds: . clotrimazole  10 mg Oral 5 X Daily  . enoxaparin (LOVENOX) injection  30 mg Subcutaneous Q24H  . feeding supplement  1 Container Oral Q24H  . feeding supplement (ENSURE ENLIVE)  237 mL Oral BID BM  . ketorolac  30 mg Intravenous Once  . LORazepam  1 mg Oral Once  . methylPREDNISolone (SOLU-MEDROL) injection  40 mg Intravenous Q24H  . ondansetron (ZOFRAN) IV  8 mg Intravenous Q8H  . potassium chloride  10 mEq Intravenous Q1 Hr x 6  . sodium chloride flush  10-40 mL Intracatheter Q12H  . sodium chloride flush  3 mL Intravenous Q12H  . sodium phosphate  Dextrose 5% IVPB  30 mmol Intravenous Once  . thiamine IV  100 mg Intravenous Daily   Continuous Infusions: . dextrose 5 % and 0.45 % NaCl with KCl 10 mEq/L 75 mL/hr at 05/07/16 0430     LOS: 2 days    Time spent: 30 min    Janece Canterbury, MD [facility] Pager [phone]  If 7PM-7AM, please contact night-coverage [email or web address] Password [facility] 05/07/2016, 11:33 AM

## 2016-05-07 NOTE — Progress Notes (Signed)
Inpatient Diabetes Program Recommendations  AACE/ADA: New Consensus Statement on Inpatient Glycemic Control (2015)  Target Ranges:  Prepandial:   less than 140 mg/dL      Peak postprandial:   less than 180 mg/dL (1-2 hours)      Critically ill patients:  140 - 180 mg/dL   Lab Results  Component Value Date   GLUCAP 202 (H) 05/05/2016  Results for ABDULWAHAB, DEMELO (MRN [id number]) as of 05/07/2016 09:33  Ref. Range 05/05/2016 00:30 05/06/2016 02:28 05/07/2016 04:34  Glucose Latest Ref Range: 65 - 99 mg/dL 55 (L) 114 (H) 287 (H)   Inpatient Diabetes Program Recommendations:   Consider checking blood sugars and add Novolog correction if appropriate.  Thanks, Adah Perl, RN, BC-ADM Inpatient Diabetes Coordinator Pager [phone] (8a-5p)

## 2016-05-07 NOTE — Progress Notes (Signed)
NIF-16, VC 500

## 2016-05-08 LAB — RENAL FUNCTION PANEL
ALBUMIN: 3.4 g/dL — AB (ref 3.5–5.0)
Anion gap: 11 (ref 5–15)
CALCIUM: 8.8 mg/dL — AB (ref 8.9–10.3)
CO2: 27 mmol/L (ref 22–32)
Chloride: 102 mmol/L (ref 101–111)
Creatinine, Ser: <0.3 mg/dL — ABNORMAL LOW (ref 0.61–1.24)
GLUCOSE: 67 mg/dL (ref 65–99)
PHOSPHORUS: 2.4 mg/dL — AB (ref 2.5–4.6)
POTASSIUM: 3.3 mmol/L — AB (ref 3.5–5.1)
SODIUM: 140 mmol/L (ref 135–145)

## 2016-05-08 LAB — CBC
HEMATOCRIT: 39.4 % (ref 39.0–52.0)
HEMOGLOBIN: 12.8 g/dL — AB (ref 13.0–17.0)
MCH: 28.9 pg (ref 26.0–34.0)
MCHC: 32.5 g/dL (ref 30.0–36.0)
MCV: 88.9 fL (ref 78.0–100.0)
Platelets: 137 10*3/uL — ABNORMAL LOW (ref 150–400)
RBC: 4.43 MIL/uL (ref 4.22–5.81)
RDW: 13.7 % (ref 11.5–15.5)
WBC: 4.7 10*3/uL (ref 4.0–10.5)

## 2016-05-08 LAB — D-DIMER, QUANTITATIVE: D-Dimer, Quant: <0.27 ug/mL-FEU (ref 0.00–0.50)

## 2016-05-08 LAB — MAGNESIUM: MAGNESIUM: 2 mg/dL (ref 1.7–2.4)

## 2016-05-08 MED ORDER — SODIUM CHLORIDE 0.9 % IV BOLUS (SEPSIS): 2000.0000 mL | Freq: Once | INTRAVENOUS | Status: DC

## 2016-05-08 MED ORDER — MAGNESIUM SULFATE IN D5W 1-5 GM/100ML-% IV SOLN
1.0000 g | Freq: Once | INTRAVENOUS | Status: AC
  Administered 2016-05-08: 1 g via INTRAVENOUS
  Filled 2016-05-08: qty 100

## 2016-05-08 MED ORDER — SODIUM CHLORIDE 0.9 % IV SOLN
INTRAVENOUS | Status: DC
  Administered 2016-05-08 – 2016-05-11 (×5): via INTRAVENOUS

## 2016-05-08 MED ORDER — SODIUM PHOSPHATES 45 MMOLE/15ML IV SOLN
20.0000 mmol | Freq: Once | INTRAVENOUS | Status: AC
  Administered 2016-05-08: 20 mmol via INTRAVENOUS
  Filled 2016-05-08: qty 6.67

## 2016-05-08 MED ORDER — POTASSIUM CHLORIDE 10 MEQ/100ML IV SOLN
10.0000 meq | Freq: Once | INTRAVENOUS | Status: AC
  Administered 2016-05-08: 10 meq via INTRAVENOUS
  Filled 2016-05-08: qty 100

## 2016-05-08 MED ORDER — POTASSIUM CHLORIDE 10 MEQ/100ML IV SOLN
10.0000 meq | INTRAVENOUS | Status: AC
  Administered 2016-05-08 (×5): 10 meq via INTRAVENOUS
  Filled 2016-05-08 (×5): qty 100

## 2016-05-08 NOTE — Progress Notes (Signed)
Pt family wants to place pt in the bathroom. Pt is a total care and I explained that we can not lift pt to bathroom without out equipment. Staff offered to place pt on the bedpan but father and brother of pt insist that they want to carry pt in bathroom because that's what they do at home.

## 2016-05-08 NOTE — Progress Notes (Signed)
Case reviewed with Dr. Radford Pax and Dr. Claiborne Billings who feel EP consult is warranted. EP is overloaded today but will see in AM. Call if any issues in the interim. D/w EP NP. Roarke Marciano PA-C

## 2016-05-08 NOTE — Consult Note (Signed)
ELECTROPHYSIOLOGY CONSULT NOTE    Patient ID: Peter Hayes MRN: [id number], DOB/AGE: 03-19-1996 20 y.o.  Admit date: 05/04/2016 Date of Consult: 05/08/2016  Primary Physician: Pcp Not In System Primary Cardiologist: new to [place] Requesting Physician: Dr Radford Pax  Reason for Consultation: tachycardia  HPI:  Peter Hayes is a 20 y.o. male with a past medical history significant for muscular dystrophy. He presented to the hospital on 7/28 with nausea, sore throat, and diarrhea.  He has been treated with IVF and antibiotics for sepsis due to probable gastroenteritis.  Heart rates on telemetry have ranged from 90-120's and EP has been asked to evaluate for treatment options.  At the time of my exam, he is lying in bed with multiple family members in the room. His mom and dad are his primary caregivers and he is seen primarily at [facility]  He does not recall ever being told what his HR was previously and [facility] is closed at this time. There are no records in [place] for HR comparison.  He currently states that he is feeling much improved from admission. He has not had palpitations and has no awareness of his heart rate.  He also has not had chest pain, shortness of breath, dizziness, pre-syncope, or syncope.  Echo this admission demonstrated EF 55-60%, no RWMA.  Technically difficult study.    Past Medical History:  Diagnosis Date  . MD (muscular dystrophy) [facility])      Surgical History: History reviewed. No pertinent surgical history.   Prescriptions Prior to Admission  Medication Sig Dispense Refill Last Dose  . Cholecalciferol (VITAMIN D PO) Take 2 tablets by mouth daily after breakfast.   05/03/2016  . Coenzyme Q10 (COQ10) 100 MG CAPS Take 100 mg by mouth daily after breakfast.   05/02/2016  . montelukast (SINGULAIR) 10 MG tablet Take 10 mg by mouth at bedtime.   05/03/2016  . ofloxacin (OCUFLOX) 0.3 % ophthalmic solution Place 2 drops into both eyes 2  (two) times daily.    05/03/2016  . Selenium 100 MCG TABS Take 100 mcg by mouth daily after breakfast.   05/02/2016    Inpatient Medications:  . clotrimazole  10 mg Oral 5 X Daily  . enoxaparin (LOVENOX) injection  30 mg Subcutaneous Q24H  . feeding supplement  1 Container Oral Q24H  . feeding supplement (ENSURE ENLIVE)  237 mL Oral BID BM  . ketorolac  30 mg Intravenous Once  . LORazepam  1 mg Oral Once  . ondansetron (ZOFRAN) IV  8 mg Intravenous Q8H  . sodium chloride  2,000 mL Intravenous Once  . sodium chloride flush  3 mL Intravenous Q12H  . thiamine injection  100 mg Intravenous Daily    Allergies: No Known Allergies  Social History   Social History  . Marital status: Single    Spouse name: N/A  . Number of children: N/A  . Years of education: N/A   Occupational History  . Not on file.   Social History Main Topics  . Smoking status: Never Smoker  . Smokeless tobacco: Never Used  . Alcohol use No  . Drug use: No  . Sexual activity: No   Other Topics Concern  . Not on file   Social History Narrative  . No narrative on file     Family History:  No premature CAD   Review of Systems: All other systems reviewed and are otherwise negative except as noted above.  Physical Exam: Vitals:   05/07/16  1540 05/07/16 2132 05/08/16 0507 05/08/16 1434  BP: 114/73 119/63 (!) 98/53 111/77  Pulse: (!) 112 (!) 101 (!) 115 (!) 107  Resp: (!) $RemoveB'24 16 16 16  'oWeywliw$ Temp: 97.7 F (36.5 C) 98.4 F (36.9 C) 97.6 F (36.4 C) 97.9 F (36.6 C)  TempSrc: Oral Oral Oral Oral  SpO2: 97% 98% 93% 99%  Weight:      Height:        GEN- The patient is well appearing, alert and oriented x 3 today.   HEENT: normocephalic, atraumatic; sclera clear, conjunctiva pink; hearing intact; oropharynx clear; neck supple  Lungs- Clear to ausculation bilaterally, normal work of breathing.  No wheezes, rales, rhonchi Heart- Tachycardic regular rate and rhythm, no murmurs, rubs or gallops  GI- soft,  non-tender, non-distended, bowel sounds present, no hepatosplenomegaly Extremities- no edema, +contractions MS- no significant deformity or atrophy Skin- warm and dry, no rash or lesion Psych- euthymic mood, full affect Neuro- weak  Labs:   Lab Results  Component Value Date   WBC 4.7 05/08/2016   HGB 12.8 (L) 05/08/2016   HCT 39.4 05/08/2016   MCV 88.9 05/08/2016   PLT 137 (L) 05/08/2016    Recent Labs Lab 05/05/16 0030  05/08/16 0416  NA 137  < > 140  K 3.5  < > 3.3*  CL 104  < > 102  CO2 12*  < > 27  BUN 10  < > <5*  CREATININE <0.30*  < > <0.30*  CALCIUM 9.8  < > 8.8*  PROT 8.0  --   --   BILITOT 2.3*  --   --   ALKPHOS 77  --   --   ALT 27  --   --   AST 38  --   --   GLUCOSE 55*  < > 67  < > = values in this interval not displayed.    Radiology/Studies: Ct Abdomen Pelvis Wo Contrast Result Date: 05/05/2016 CLINICAL DATA:  20 year old male with history of muscular dystrophy presenting with fever. Evaluate for pneumoperitoneum. EXAM: CT ABDOMEN AND PELVIS WITHOUT CONTRAST TECHNIQUE: Multidetector CT imaging of the abdomen and pelvis was performed following the standard protocol without IV contrast. COMPARISON:  Chest radiograph dated 05/04/2016 FINDINGS: Evaluation of this exam is limited in the absence of intravenous contrast. The visualized lung bases are clear. No intra-abdominal free air or free fluid. Diffuse fatty infiltration of the liver. The gallbladder is not well visualized, likely contracted. No calcified stone identified. The pancreas, spleen, adrenal glands, kidneys, visualized ureters, and urinary bladder appear unremarkable. The prostate and seminal vesicles are grossly unremarkable. Evaluation of the bowel is limited in the absence of oral contrast. There is air distention of the colon and rectosigmoid without evidence of mechanical obstruction. There is no evidence of small bowel obstruction or active inflammation. Normal appendix. The abdominal aorta and IVC  appear unremarkable on this noncontrast study. No portal venous gas identified. There is no adenopathy. There is diffuse fatty replacement of the musculature compatible with known muscular dystrophy. The bones are osteopenic. There is scoliosis of the spine. Chronic left sacroiliitis. Vacuum phenomena. No acute fracture. IMPRESSION: No acute intra-abdominal pelvic pathology.  No free air. Air distal colon and rectosigmoid without evidence of mechanical obstruction. Findings may represent an adynamic ileus. Clinical correlation is recommended. Electronically Signed   By: Anner Crete M.D.   On: 05/05/2016 02:00  Dg Chest 2 View Result Date: 05/05/2016 CLINICAL DATA:  20 year old male with fever EXAM: CHEST  2 VIEW COMPARISON:  None FINDINGS: The lungs are clear. There is no pleural effusion or pneumothorax. The cardiac silhouette is within normal limits. No acute osseous pathology identified. Air distended loop of colon noted under the left hemidiaphragm. There is air in the upper abdomen outlining both sides of the colonic wall. Although this likely represents air within adjacent distended bowel, pneumoperitoneum is not excluded. Correlation with clinical exam is recommended. If there is clinical concern for pneumoperitoneum CT is recommended for further evaluation. IMPRESSION: No active cardiopulmonary disease. Air distended loops of bowel in the upper abdomen. Pneumoperitoneum is not excluded. Correlation with clinical exam recommended. CT may provide better evaluation if there is clinical concern for pneumoperitoneum. Electronically Signed   By: Anner Crete M.D.   On: 05/05/2016 01:03   SCB:IPJRP tach, rate 103  TELEMETRY: sinus tachycardia, rates 100's  Assessment/Plan: 1.  Sinus tachycardia  The patient is completely asymptomatic in regards to his HR and has a normal echo (although technically difficult).   In the absence of symptoms, I do not think any intervention will be necessary at  this time Dr Tae Robak to see in the morning.  He is followed closely by [facility]. Would recommend follow up there as an outpatient.   Chanetta Marshall, NP 05/08/2016 6:30 PM    I have seen, examined the patient, and reviewed the above assessment and plan.  On exam, RRR. Changes to above are made where necessary.  Telemetry is reviewed and reveals sinus tachycardia, now resolved with IV hydration.  Currently, heart rate is 80s.  No arrhythmias on telemetry. Elevated heart rate was reactive sinus tachycardia due to underlying medical illness and appears resolved.  No indication for additional workup at this time.   Electrophysiology team to see as needed while here. Please call with questions.   Co Sign: Thompson Grayer, MD 05/09/2016 1:33 PM

## 2016-05-08 NOTE — Care Management Note (Addendum)
Case Management Note  Patient Details  Name: Peter Hayes MRN: [id number] Date of Birth: 11-Aug-1996  Subjective/Objective:                 Spoke with patient mother father and siblings in the room. Patient is a 20 year old male who lives with family. He has muscular dystrophy. Parents state they are well resources at home. Deny needs for DME or assistance. Patient admitted with sepsis, is receiving electrolyte replacement today and will likely DC if tolerates PO well.    Action/Plan:  DC to home in care of family. No CM needs identified.  Addendum 05/11/16 Patient completed treatment for sepsis +bld cx. Will DC today on PO abx. Patient/ family provided resources on Outpatient OT and further Musc. Dyst. Resources from OT.  Expected Discharge Date:                  Expected Discharge Plan:  Home/Self Care  In-House Referral:     Discharge planning Services  CM Consult  Post Acute Care Choice:  NA Choice offered to:  NA  DME Arranged:  N/A DME Agency:  NA  HH Arranged:  NA HH Agency:  NA  Status of Service:  Completed, signed off  If discussed at [place] of Stay Meetings, dates discussed:    Additional Comments:  Carles Collet, RN 05/08/2016, 11:06 AM

## 2016-05-08 NOTE — Progress Notes (Addendum)
PROGRESS NOTE  Peter Hayes  PJA:[id number] DOB: 1996-05-24 DOA: 05/04/2016 PCP: Pcp Not In System  Brief Narrative:   Peter Hayes is a 20 y.o. male with significant past medical history muscular dystrophy and wheelchair-bound who presented to [facility] ER complaining of sudden onset and gradual worsening nausea, sore throat, rhinorrhea, as well as mild diarrhea.  His parents felt that he appeared to be breathing fast so they brought him to the ER.  [name] are his caretakers.  Other than nausea, the patient stated that he was overall feeling better.  Due to concern for sepsis, he was given vancomycin and zosyn and 4L NS.  Diarrhea has slowed, he is eating better.  Tachycardia persists.    Assessment & Plan:   Principal Problem:   Sepsis ([place]) Active Problems:   Muscular dystrophy ([place])   Moderate protein-calorie malnutrition ([facility])   Tachycardia   Leukocytosis   High anion gap metabolic acidosis   Respiratory alkalosis  Sepsis due to probable gastroenteritis suggested by "adynamic ileus" on CT despite hyperactive BS on exam.  CXR and UA unremarkable.   -  Antibiotics discontinued on 7/30  -  Blood culture:  S. Epidermidus, likely contaminate -  Rapid strep negative -  Monospot negative -  Procalcitonin negative -  Continue scheduled zofran and phenergan prn  Anion gap metabolic acidosis due to dehydration and diarrhea, resolved with IVF -  Repeat BMP in AM  Severe protein calorie malnutrition due to acute illness -  Nutrition consultation -  Supplements  Hypokalemia, hypomagnesemia, and hypophosphatemia suggest chronic malnutrition vs. Acute losses.  -  IV KCl -  IV magnesium 1gm -  IV sodium phos 8mmol -  Repeat electrolytes in AM  Sinus tachycardia.  Has received 8-10L of IVF resuscitation (most of which has not been recorded).  TSH wnl and D-dimer negative.  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  Patient remains asymptomatic.  HR  increases to 140s when sitting in chair -  Additional 2L bolus and IVF  -  ECHO demonstrated preserved EF -  Spoke with Muscular Dystrophy specialist at [facility] about whether she often sees chronic tachycardia in a DMD patient in the setting of malnutrition, muscular wasting, and she stated that it is NOT typical and recommended cardiology consultation -  Cardiology consult placed to exclude DMD-related tachyarrhythmia -  BP has improved, could try low dose BB but will defer to cardiology  Muscular dystrophy with difficulty swallowing, worsened weakness probably due to viral illness -  Steroid burst x 2 doses -  PT eval -  NIF decreasing:  -12cmH2O and VC 0.7L -  Needs follow up at [facility] MD clinic  Sore throat with denuded soft palate, improving -  continue Clotrimazole -  Magic mouthwash trial -  Appreciate SLP reevaluation  Mild anemia and thrombocytopenia are likely hemodilutional.   -  Repeat CBC in AM  DVT prophylaxis:  lovenox Code Status:  full Family Communication:  Patient and his brother at bedside Disposition Plan:  Likely home on Wednesday once seen by EP   Consultants:   SLP  Procedures:  none  Antimicrobials:   Vancomycin 7/29 > 7/30  Zosyn 7/29   Subjective: Denies pain and nausea has improved.  Ate much better yesterday.  Objective: Vitals:   05/07/16 1540 05/07/16 2132 05/08/16 0507 05/08/16 1434  BP: 114/73 119/63 (!) 98/53 111/77  Pulse: (!) 112 (!) 101 (!) 115 (!) 107  Resp: Marland Kitchen)  $'24 16 16 16  'l$ Temp: 97.7 F (36.5 C) 98.4 F (36.9 C) 97.6 F (36.4 C) 97.9 F (36.6 C)  TempSrc: Oral Oral Oral Oral  SpO2: 97% 98% 93% 99%  Weight:      Height:        Intake/Output Summary (Last 24 hours) at 05/08/16 2045 Last data filed at 05/08/16 1439  Gross per 24 hour  Intake              598 ml  Output                0 ml  Net              598 ml   Filed Weights   05/04/16 2244 05/05/16 0646 05/07/16 1535  Weight: 49.9 kg (110 lb)  37.7 kg (83 lb 1.8 oz) 40.1 kg (88 lb 6.5 oz)    Examination:  General exam:  Adult male, round face.  No acute distress.  HEENT:  NCAT, MMM Respiratory system: Clear to auscultation bilaterally Cardiovascular system:  Tachycardic, regular rhythm, normal S1/S2. No murmurs, rubs, gallops or clicks.  Warm extremities Gastrointestinal system:  Normal active active bowel sounds, soft, nondistended, nontender.  Toy [place] than yesterday MSK:  Decreased tone and bulk, left hand somewhat contracted and both feet are contracted.  No lower extremity edema Neuro:  Diffusely weak, lying in bed   Data Reviewed: I have personally reviewed following labs and imaging studies  CBC:  Recent Labs Lab 05/05/16 0030 05/06/16 0228 05/07/16 0434 05/08/16 0436  WBC 29.1* 8.1 5.5 4.7  NEUTROABS 27.0*  --   --   --   HGB 15.3 12.9* 11.8* 12.8*  HCT 45.5 39.2 35.8* 39.4  MCV 88.5 88.1 87.1 88.9  PLT 285 164 142* 546*   Basic Metabolic Panel:  Recent Labs Lab 05/05/16 0030 05/06/16 0228 05/07/16 0434 05/08/16 0416  NA 137 138 134* 140  K 3.5 2.3* 3.2* 3.3*  CL 104 111 104 102  CO2 12* 19* 24 27  GLUCOSE 55* 114* 287* 67  BUN 10 <5* <5* <5*  CREATININE <0.30* <0.30* <0.30* <0.30*  CALCIUM 9.8 8.6* 7.9* 8.8*  MG  --  1.5* 2.0 2.0  PHOS  --  1.4* 1.5* 2.4*   GFR: CrCl cannot be calculated (This lab value cannot be used to calculate CrCl because it is not a number: <0.30). Liver Function Tests:  Recent Labs Lab 05/05/16 0030 05/07/16 0434 05/08/16 0416  AST 38  --   --   ALT 27  --   --   ALKPHOS 77  --   --   BILITOT 2.3*  --   --   PROT 8.0  --   --   ALBUMIN 5.0 3.2* 3.4*   No results for input(s): LIPASE, AMYLASE in the last 168 hours. No results for input(s): AMMONIA in the last 168 hours. Coagulation Profile: No results for input(s): INR, PROTIME in the last 168 hours. Cardiac Enzymes:  Recent Labs Lab 05/07/16 0434  CKTOTAL 202   BNP (last 3 results) No results for  input(s): PROBNP in the last 8760 hours. HbA1C: No results for input(s): HGBA1C in the last 72 hours. CBG:  Recent Labs Lab 05/05/16 0304  GLUCAP 202*   Lipid Profile: No results for input(s): CHOL, HDL, LDLCALC, TRIG, CHOLHDL, LDLDIRECT in the last 72 hours. Thyroid Function Tests: No results for input(s): TSH, T4TOTAL, FREET4, T3FREE, THYROIDAB in the last 72 hours. Anemia Panel: No results for  input(s): VITAMINB12, FOLATE, FERRITIN, TIBC, IRON, RETICCTPCT in the last 72 hours. Urine analysis:    Component Value Date/Time   COLORURINE YELLOW 05/05/2016 0236   APPEARANCEUR CLEAR 05/05/2016 0236   LABSPEC 1.019 05/05/2016 0236   PHURINE 5.5 05/05/2016 0236   GLUCOSEU 500 (A) 05/05/2016 0236   HGBUR NEGATIVE 05/05/2016 0236   BILIRUBINUR SMALL (A) 05/05/2016 0236   KETONESUR >80 (A) 05/05/2016 0236   PROTEINUR NEGATIVE 05/05/2016 0236   NITRITE NEGATIVE 05/05/2016 0236   LEUKOCYTESUR NEGATIVE 05/05/2016 0236   Sepsis Labs: $RemoveBefo'@[email or web address]'sgwASApaccr$ (procalcitonin:4,lacticidven:4)  ) Recent Results (from the past 240 hour(s))  Blood Culture (routine x 2)     Status: Abnormal (Preliminary result)   Collection Time: 05/05/16 12:25 AM  Result Value Ref Range Status   Specimen Description BLOOD NECK  Final   Special Requests BOTTLES DRAWN AEROBIC AND ANAEROBIC 5CC  Final   Culture  Setup Time   Final    GRAM POSITIVE COCCI IN CLUSTERS AEROBIC BOTTLE ONLY CRITICAL RESULT CALLED TO, READ BACK BY AND VERIFIED WITH: M TURNER,PHARMD AT 1710 05/06/16 BY L BENFIELD    Culture (A)  Final    STAPHYLOCOCCUS SPECIES CULTURE REINCUBATED FOR BETTER GROWTH Performed at [facility]    Report Status PENDING  Incomplete  Blood Culture ID Panel (Reflexed)     Status: Abnormal   Collection Time: 05/05/16 12:25 AM  Result Value Ref Range Status   Enterococcus species NOT DETECTED NOT DETECTED Final   Vancomycin resistance NOT DETECTED NOT DETECTED Final   Listeria monocytogenes NOT DETECTED NOT  DETECTED Final   Staphylococcus species DETECTED (A) NOT DETECTED Final    Comment: CRITICAL RESULT CALLED TO, READ BACK BY AND VERIFIED WITH: M TURNER,PHARMD AT 1710 05/06/16 BY L BENFIELD    Staphylococcus aureus NOT DETECTED NOT DETECTED Final   Methicillin resistance NOT DETECTED NOT DETECTED Final   Streptococcus species NOT DETECTED NOT DETECTED Final   Streptococcus agalactiae NOT DETECTED NOT DETECTED Final   Streptococcus pneumoniae NOT DETECTED NOT DETECTED Final   Streptococcus pyogenes NOT DETECTED NOT DETECTED Final   Acinetobacter baumannii NOT DETECTED NOT DETECTED Final   Enterobacteriaceae species NOT DETECTED NOT DETECTED Final   Enterobacter cloacae complex NOT DETECTED NOT DETECTED Final   Escherichia coli NOT DETECTED NOT DETECTED Final   Klebsiella oxytoca NOT DETECTED NOT DETECTED Final   Klebsiella pneumoniae NOT DETECTED NOT DETECTED Final   Proteus species NOT DETECTED NOT DETECTED Final   Serratia marcescens NOT DETECTED NOT DETECTED Final   Carbapenem resistance NOT DETECTED NOT DETECTED Final   Haemophilus influenzae NOT DETECTED NOT DETECTED Final   Neisseria meningitidis NOT DETECTED NOT DETECTED Final   Pseudomonas aeruginosa NOT DETECTED NOT DETECTED Final   Candida albicans NOT DETECTED NOT DETECTED Final   Candida glabrata NOT DETECTED NOT DETECTED Final   Candida krusei NOT DETECTED NOT DETECTED Final   Candida parapsilosis NOT DETECTED NOT DETECTED Final   Candida tropicalis NOT DETECTED NOT DETECTED Final    Comment: Performed at [facility]  Urine culture     Status: None   Collection Time: 05/05/16  2:36 AM  Result Value Ref Range Status   Specimen Description URINE, RANDOM  Final   Special Requests NONE  Final   Culture NO GROWTH Performed at [facility]   Final   Report Status 05/06/2016 FINAL  Final  MRSA PCR Screening     Status: None   Collection Time: 05/05/16  6:51 AM  Result Value  Ref Range Status   MRSA by PCR  NEGATIVE NEGATIVE Final    Comment:        The GeneXpert MRSA Assay (FDA approved for NASAL specimens only), is one component of a comprehensive MRSA colonization surveillance program. It is not intended to diagnose MRSA infection nor to guide or monitor treatment for MRSA infections.   Rapid strep screen (not at [facility])     Status: None   Collection Time: 05/05/16 10:33 AM  Result Value Ref Range Status   Streptococcus, Group A Screen (Direct) NEGATIVE NEGATIVE Final    Comment: (NOTE) A Rapid Antigen test may result negative if the antigen level in the sample is below the detection level of this test. The FDA has not cleared this test as a stand-alone test therefore the rapid antigen negative result has reflexed to a Group A Strep culture.   Culture, group A strep     Status: None   Collection Time: 05/05/16 10:33 AM  Result Value Ref Range Status   Specimen Description THROAT  Final   Special Requests NONE Reflexed from [id number]  Final   Culture NO GROUP A STREP (S.PYOGENES) ISOLATED  Final   Report Status 05/07/2016 FINAL  Final      Radiology Studies: No results found.   Scheduled Meds: . clotrimazole  10 mg Oral 5 X Daily  . enoxaparin (LOVENOX) injection  30 mg Subcutaneous Q24H  . feeding supplement  1 Container Oral Q24H  . feeding supplement (ENSURE ENLIVE)  237 mL Oral BID BM  . ketorolac  30 mg Intravenous Once  . LORazepam  1 mg Oral Once  . ondansetron (ZOFRAN) IV  8 mg Intravenous Q8H  . sodium chloride  2,000 mL Intravenous Once  . sodium chloride flush  3 mL Intravenous Q12H  . thiamine injection  100 mg Intravenous Daily   Continuous Infusions: . sodium chloride 150 mL/hr at 05/08/16 1647     LOS: 3 days    Time spent: 30 min    Janece Canterbury, MD [facility] Pager [phone]  If 7PM-7AM, please contact night-coverage [email or web address] Password [email or web address] 05/08/2016, 8:45 PM

## 2016-05-08 NOTE — Progress Notes (Signed)
RT Note: Respiratory mechanics were performed on the patient. He was up in the chair at the time and gave a good effort. Results are as follows: Nif- -12, Vital capacity- .7 of a Liter (700). Nif is worse than the last documented result last night. MD will be notified of the decline. Rt will continue to monitor.

## 2016-05-08 NOTE — Progress Notes (Addendum)
RT Note: Respiratory mechanics were performed on the patient. He was lying supine in bed at approx 40 deg at the time. Results are as follows: NIF- -7, VC- .84 L. NIF is decreasing with last documented result. MD will be notified. RT will continue to monitor. It should be noted that mouthpiece used during NIF maneuver made it difficult to give good effort bec of pt's protruding front teeth, making for difficult seal. Pt position in bed need also be noted as having negative affect on NIF and poor effort. Pt gave good effort with VC maneuver.

## 2016-05-09 DIAGNOSIS — E440 Moderate protein-calorie malnutrition: Secondary | ICD-10-CM

## 2016-05-09 DIAGNOSIS — R000 Tachycardia, unspecified: Secondary | ICD-10-CM

## 2016-05-09 DIAGNOSIS — D72829 Elevated white blood cell count, unspecified: Secondary | ICD-10-CM

## 2016-05-09 DIAGNOSIS — A409 Streptococcal sepsis, unspecified: Secondary | ICD-10-CM

## 2016-05-09 LAB — RENAL FUNCTION PANEL
ANION GAP: 7 (ref 5–15)
Albumin: 3.1 g/dL — ABNORMAL LOW (ref 3.5–5.0)
BUN: <5 mg/dL — ABNORMAL LOW (ref 6–20)
CALCIUM: 8.3 mg/dL — AB (ref 8.9–10.3)
CHLORIDE: 105 mmol/L (ref 101–111)
CO2: 27 mmol/L (ref 22–32)
Creatinine, Ser: <0.3 mg/dL — ABNORMAL LOW (ref 0.61–1.24)
GLUCOSE: 69 mg/dL (ref 65–99)
Phosphorus: 3.2 mg/dL (ref 2.5–4.6)
Potassium: 3.2 mmol/L — ABNORMAL LOW (ref 3.5–5.1)
SODIUM: 139 mmol/L (ref 135–145)

## 2016-05-09 LAB — CBC
HCT: 38.1 % — ABNORMAL LOW (ref 39.0–52.0)
HEMOGLOBIN: 12.2 g/dL — AB (ref 13.0–17.0)
MCH: 28.8 pg (ref 26.0–34.0)
MCHC: 32 g/dL (ref 30.0–36.0)
MCV: 90.1 fL (ref 78.0–100.0)
PLATELETS: 130 10*3/uL — AB (ref 150–400)
RBC: 4.23 MIL/uL (ref 4.22–5.81)
RDW: 13.6 % (ref 11.5–15.5)
WBC: 4 10*3/uL (ref 4.0–10.5)

## 2016-05-09 LAB — MAGNESIUM: Magnesium: 1.6 mg/dL — ABNORMAL LOW (ref 1.7–2.4)

## 2016-05-09 MED ORDER — POTASSIUM CHLORIDE CRYS ER 20 MEQ PO TBCR
40.0000 meq | EXTENDED_RELEASE_TABLET | Freq: Once | ORAL | Status: AC
  Administered 2016-05-09: 40 meq via ORAL
  Filled 2016-05-09: qty 2

## 2016-05-09 MED ORDER — MAGNESIUM SULFATE IN D5W 1-5 GM/100ML-% IV SOLN
1.0000 g | Freq: Once | INTRAVENOUS | Status: AC
  Administered 2016-05-09: 1 g via INTRAVENOUS
  Filled 2016-05-09: qty 100

## 2016-05-09 NOTE — Progress Notes (Signed)
PROGRESS NOTE  Peter Hayes  TMA:[id number] DOB: 1996/06/16 DOA: 05/04/2016 PCP: Pcp Not In System  Brief Narrative:   Peter Hayes is a 20 y.o. male with significant past medical history muscular dystrophy and wheelchair-bound who presented to [facility] ER complaining of sudden onset and gradual worsening nausea, sore throat, rhinorrhea, as well as mild diarrhea.  His parents felt that he appeared to be breathing fast so they brought him to the ER.  [name] are his caretakers.  Other than nausea, the patient stated that he was overall feeling better.  Due to concern for sepsis, he was given vancomycin and zosyn and 4L NS.  Diarrhea has slowed, he is eating better.  Tachycardia persists.    Assessment & Plan:   Principal Problem:   Sepsis (Peter Hayes) Active Problems:   Muscular dystrophy (Peter Hayes)   Moderate protein-calorie malnutrition (HCC)   Tachycardia   Leukocytosis   High anion gap metabolic acidosis   Respiratory alkalosis  Sepsis due to probable gastroenteritis suggested by "adynamic ileus" on CT despite hyperactive BS on exam.  CXR and UA unremarkable.   -  Antibiotics discontinued on 7/30  -  Blood culture:  S. Epidermidus, likely contaminate -  Rapid strep negative -  Monospot negative -  Procalcitonin negative -  Continue scheduled zofran and phenergan prn  Anion gap metabolic acidosis due to dehydration and diarrhea, resolved with IVF -  Repeat BMP in AM  Severe protein calorie malnutrition due to acute illness -  Nutrition consultation -  Supplements  Hypokalemia, hypomagnesemia, and hypophosphatemia suggest chronic malnutrition vs. Acute losses.  - K dur -  IV magnesium 1gm (repeat order today) -  Phosphorus wnl after replacement -  Repeat electrolytes in AM  Sinus tachycardia.  Has received 8-10L of IVF resuscitation (most of which has not been recorded).  TSH wnl and D-dimer negative.  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   Patient remains asymptomatic.  HR increases to 140s when sitting in chair -  Additional 2L bolus and IVF  -  ECHO demonstrated preserved EF -  Spoke with Muscular Dystrophy specialist at [facility] about whether she often sees chronic tachycardia in a DMD patient in the setting of malnutrition, muscular wasting, and she stated that it is NOT typical and recommended cardiology consultation -  Cardiology consult placed to exclude DMD-related tachyarrhythmia - EP consulted for further evaluation.  Muscular dystrophy with difficulty swallowing, worsened weakness probably due to viral illness -  Steroid burst x 2 doses -  PT eval -  NIF decreasing:  -12cmH2O and VC 0.7L -  Needs [facility] up at [facility]  Sore throat with denuded soft palate, improving -  continue Clotrimazole -  Magic mouthwash trial -  Appreciate SLP re-evaluation  Mild anemia and thrombocytopenia are likely hemodilutional.   -  Repeat CBC in AM  DVT prophylaxis:  lovenox Code Status:  full Family Communication:  Patient and his brother at bedside Disposition Plan:  Likely home once seen by EP   Consultants:   SLP  Procedures:  none  Antimicrobials:   Vancomycin 7/29 > 7/30  Zosyn 7/29   Subjective: Pt has no new complaints. No acute issues overnight.  Objective: Vitals:   05/08/16 1434 05/08/16 2126 05/09/16 0521 05/09/16 1302  BP: 111/77 115/68 113/78 136/89  Pulse: (!) 107 97 95 (!) 106  Resp: $Remo'16 17 16 19  'DmoAN$ Temp: 97.9 F (36.6 C) 98.2 F (36.8 C) 97.5  F (36.4 C) 97.9 F (36.6 C)  TempSrc: Oral Oral Oral Oral  SpO2: 99% 100% 98% 100%  Weight:      Height:        Intake/Output Summary (Last 24 hours) at 05/09/16 1326 Last data filed at 05/09/16 0900  Gross per 24 hour  Intake             2072 ml  Output                0 ml  Net             2072 ml   Filed Weights   05/04/16 2244 05/05/16 0646 05/07/16 1535  Weight: 49.9 kg (110 lb) 37.7 kg (83 lb 1.8 oz) 40.1 kg (88 lb  6.5 oz)    Examination:  General exam:  Awake and alert and in nad. HEENT:  NCAT, MMM Respiratory system: Clear to auscultation bilaterally, no wheezes Cardiovascular system:  Tachycardic, regular rhythm, normal S1/S2. No murmurs, rubs, gallops or clicks.  Warm extremities Gastrointestinal system:  Normal active active bowel sounds, soft, nondistended, nontender.  Toy [place] than yesterday MSK:  Decreased tone and bulk, left hand somewhat contracted and both feet are contracted.  No lower extremity edema Neuro:  Diffusely weak, lying in bed   Data Reviewed: I have personally reviewed following labs and imaging studies  CBC:  Recent Labs Lab 05/05/16 0030 05/06/16 0228 05/07/16 0434 05/08/16 0436 05/09/16 0404  WBC 29.1* 8.1 5.5 4.7 4.0  NEUTROABS 27.0*  --   --   --   --   HGB 15.3 12.9* 11.8* 12.8* 12.2*  HCT 45.5 39.2 35.8* 39.4 38.1*  MCV 88.5 88.1 87.1 88.9 90.1  PLT 285 164 142* 137* 892*   Basic Metabolic Panel:  Recent Labs Lab 05/05/16 0030 05/06/16 0228 05/07/16 0434 05/08/16 0416 05/09/16 0404  NA 137 138 134* 140 139  K 3.5 2.3* 3.2* 3.3* 3.2*  CL 104 111 104 102 105  CO2 12* 19* $Remov'24 27 27  'wFqivP$ GLUCOSE 55* 114* 287* 67 69  BUN 10 <5* <5* <5* <5*  CREATININE <0.30* <0.30* <0.30* <0.30* <0.30*  CALCIUM 9.8 8.6* 7.9* 8.8* 8.3*  MG  --  1.5* 2.0 2.0 1.6*  PHOS  --  1.4* 1.5* 2.4* 3.2   GFR: CrCl cannot be calculated (This lab value cannot be used to calculate CrCl because it is not a number: <0.30). Liver Function Tests:  Recent Labs Lab 05/05/16 0030 05/07/16 0434 05/08/16 0416 05/09/16 0404  AST 38  --   --   --   ALT 27  --   --   --   ALKPHOS 77  --   --   --   BILITOT 2.3*  --   --   --   PROT 8.0  --   --   --   ALBUMIN 5.0 3.2* 3.4* 3.1*   No results for input(s): LIPASE, AMYLASE in the last 168 hours. No results for input(s): AMMONIA in the last 168 hours. Coagulation Profile: No results for input(s): INR, PROTIME in the last 168  hours. Cardiac Enzymes:  Recent Labs Lab 05/07/16 0434  CKTOTAL 202   BNP (last 3 results) No results for input(s): PROBNP in the last 8760 hours. HbA1C: No results for input(s): HGBA1C in the last 72 hours. CBG:  Recent Labs Lab 05/05/16 0304  GLUCAP 202*   Lipid Profile: No results for input(s): CHOL, HDL, LDLCALC, TRIG, CHOLHDL, LDLDIRECT in the last 72 hours.  Thyroid Function Tests: No results for input(s): TSH, T4TOTAL, FREET4, T3FREE, THYROIDAB in the last 72 hours. Anemia Panel: No results for input(s): VITAMINB12, FOLATE, FERRITIN, TIBC, IRON, RETICCTPCT in the last 72 hours. Urine analysis:    Component Value Date/Time   COLORURINE YELLOW 05/05/2016 0236   APPEARANCEUR CLEAR 05/05/2016 0236   LABSPEC 1.019 05/05/2016 0236   PHURINE 5.5 05/05/2016 0236   GLUCOSEU 500 (A) 05/05/2016 0236   HGBUR NEGATIVE 05/05/2016 0236   BILIRUBINUR SMALL (A) 05/05/2016 0236   KETONESUR >80 (A) 05/05/2016 0236   PROTEINUR NEGATIVE 05/05/2016 0236   NITRITE NEGATIVE 05/05/2016 0236   LEUKOCYTESUR NEGATIVE 05/05/2016 0236   Sepsis Labs: $RemoveBefo'@LABRCNTIP'eKzxMGMmGkf$ (procalcitonin:4,lacticidven:4)  ) Recent Results (from the past 240 hour(s))  Blood Culture (routine x 2)     Status: Abnormal (Preliminary result)   Collection Time: 05/05/16 12:25 AM  Result Value Ref Range Status   Specimen Description BLOOD NECK  Final   Special Requests BOTTLES DRAWN AEROBIC AND ANAEROBIC 5CC  Final   Culture  Setup Time   Final    GRAM POSITIVE COCCI IN CLUSTERS AEROBIC BOTTLE ONLY CRITICAL RESULT CALLED TO, READ BACK BY AND VERIFIED WITH: M TURNER,PHARMD AT 1710 05/06/16 BY L BENFIELD GRAM POSITIVE RODS ANAEROBIC BOTTLE ONLY CRITICAL RESULT CALLED TO, READ BACK BY AND VERIFIED WITH: L. [facility] RN AT 2109 05/08/16 BY D. VANHOOK    Culture (A)  Final    STAPHYLOCOCCUS SPECIES THE SIGNIFICANCE OF ISOLATING THIS ORGANISM FROM A SINGLE VENIPUNCTURE CANNOT BE PREDICTED WITHOUT FURTHER CLINICAL AND CULTURE  CORRELATION. SUSCEPTIBILITIES AVAILABLE ONLY ON REQUEST. Performed at [facility]    Report Status PENDING  Incomplete  Blood Culture ID Panel (Reflexed)     Status: Abnormal   Collection Time: 05/05/16 12:25 AM  Result Value Ref Range Status   Enterococcus species NOT DETECTED NOT DETECTED Final   Vancomycin resistance NOT DETECTED NOT DETECTED Final   Listeria monocytogenes NOT DETECTED NOT DETECTED Final   Staphylococcus species DETECTED (A) NOT DETECTED Final    Comment: CRITICAL RESULT CALLED TO, READ BACK BY AND VERIFIED WITH: M TURNER,PHARMD AT 1710 05/06/16 BY L BENFIELD    Staphylococcus aureus NOT DETECTED NOT DETECTED Final   Methicillin resistance NOT DETECTED NOT DETECTED Final   Streptococcus species NOT DETECTED NOT DETECTED Final   Streptococcus agalactiae NOT DETECTED NOT DETECTED Final   Streptococcus pneumoniae NOT DETECTED NOT DETECTED Final   Streptococcus pyogenes NOT DETECTED NOT DETECTED Final   Acinetobacter baumannii NOT DETECTED NOT DETECTED Final   Enterobacteriaceae species NOT DETECTED NOT DETECTED Final   Enterobacter cloacae complex NOT DETECTED NOT DETECTED Final   Escherichia coli NOT DETECTED NOT DETECTED Final   Klebsiella oxytoca NOT DETECTED NOT DETECTED Final   Klebsiella pneumoniae NOT DETECTED NOT DETECTED Final   Proteus species NOT DETECTED NOT DETECTED Final   Serratia marcescens NOT DETECTED NOT DETECTED Final   Carbapenem resistance NOT DETECTED NOT DETECTED Final   Haemophilus influenzae NOT DETECTED NOT DETECTED Final   Neisseria meningitidis NOT DETECTED NOT DETECTED Final   Pseudomonas aeruginosa NOT DETECTED NOT DETECTED Final   Candida albicans NOT DETECTED NOT DETECTED Final   Candida glabrata NOT DETECTED NOT DETECTED Final   Candida krusei NOT DETECTED NOT DETECTED Final   Candida parapsilosis NOT DETECTED NOT DETECTED Final   Candida tropicalis NOT DETECTED NOT DETECTED Final    Comment: Performed at [facility]  Urine culture     Status: None   Collection Time:  05/05/16  2:36 AM  Result Value Ref Range Status   Specimen Description URINE, RANDOM  Final   Special Requests NONE  Final   Culture NO GROWTH Performed at [facility]   Final   Report Status 05/06/2016 FINAL  Final  MRSA PCR Screening     Status: None   Collection Time: 05/05/16  6:51 AM  Result Value Ref Range Status   MRSA by PCR NEGATIVE NEGATIVE Final    Comment:        The GeneXpert MRSA Assay (FDA approved for NASAL specimens only), is one component of a comprehensive MRSA colonization surveillance program. It is not intended to diagnose MRSA infection nor to guide or monitor treatment for MRSA infections.   Rapid strep screen (not at [facility])     Status: None   Collection Time: 05/05/16 10:33 AM  Result Value Ref Range Status   Streptococcus, Group A Screen (Direct) NEGATIVE NEGATIVE Final    Comment: (NOTE) A Rapid Antigen test may result negative if the antigen level in the sample is below the detection level of this test. The FDA has not cleared this test as a stand-alone test therefore the rapid antigen negative result has reflexed to a Group A Strep culture.   Culture, group A strep     Status: None   Collection Time: 05/05/16 10:33 AM  Result Value Ref Range Status   Specimen Description THROAT  Final   Special Requests NONE Reflexed from [id number]  Final   Culture NO GROUP A STREP (S.PYOGENES) ISOLATED  Final   Report Status 05/07/2016 FINAL  Final      Radiology Studies: No results found.   Scheduled Meds: . clotrimazole  10 mg Oral 5 X Daily  . enoxaparin (LOVENOX) injection  30 mg Subcutaneous Q24H  . feeding supplement  1 Container Oral Q24H  . feeding supplement (ENSURE ENLIVE)  237 mL Oral BID BM  . ketorolac  30 mg Intravenous Once  . LORazepam  1 mg Oral Once  . ondansetron (ZOFRAN) IV  8 mg Intravenous Q8H  . potassium chloride  40 mEq Oral Once  . sodium chloride  2,000 mL  Intravenous Once  . sodium chloride flush  3 mL Intravenous Q12H  . thiamine injection  100 mg Intravenous Daily   Continuous Infusions: . sodium chloride 150 mL/hr at 05/09/16 1055     LOS: 4 days    Time spent: 30 min  Velvet Bathe, MD [facility] Pager [phone]  If 7PM-7AM, please contact night-coverage [email or web address] Password [email or web address] 05/09/2016, 1:26 PM

## 2016-05-09 NOTE — Progress Notes (Signed)
Notified NP Baltazar Najjar that pt's blood culture that was drawn 05/05/2016 grew gram (+) rods. This was in the anaerobic  bottle. Will continue to monitor.

## 2016-05-09 NOTE — Progress Notes (Signed)
Occupational Therapy Evaluation Patient Details Name: Peter Hayes MRN: [id number] DOB: 27-Jan-1996 Today's Date: 05/09/2016    History of Present Illness Peter Hayes is a 20 y.o. male with significant past medical history for Duchenne muscular dystrophy and wheelchair-bound, presents to [facility] ER complaining of sudden onset and gradual worsening nausea, sore throat, rhinorrhea, as well as mild diarrhea. Blood culture grew gram + rods in anaerobic bottle. Tachycardic. Concern for sepsis.    Clinical Impression   Pt seen with family present. Pt with contractures BU/LE. Will follow pt acutely and assess for AE and begin education regarding positioning, further contracture prevention, & HEP. Discussed recommendation to follow up with neuro outpt OT to further assess needs and explore options to assist with decreasing burden of care and maximizing pts functional level of independence with possible involvement of community resources. Dad in agreement with follow up with neuro outpt OT. Once at neuro outpt OT, staff can assess if positioning changes need to be made to pt's seating system. Recommend B prevalon boots to reduce pressure B lateral malleolus. Will follow up tomorrow.     Follow Up Recommendations  Outpatient OT;Supervision/Assistance - 24 hour (neuro outpt OT)    Equipment Recommendations  None recommended by OT    Recommendations for Other Services       Precautions / Restrictions Precautions Precautions: Fall;Other (comment) (skin breakdown; contractures) Restrictions Weight Bearing Restrictions: No      Mobility Bed Mobility Overal bed mobility: Needs Assistance             General bed mobility comments: total A  Transfers                 General transfer comment: total A at baseline    Balance                                            ADL Overall ADL's : Needs assistance/impaired                                      Functional mobility during ADLs: Total assistance (Dad lifts pt into chair. Pt operated w/c with joystick) General ADL Comments: Pt staes he usually feed himself by having someone thread his utensil though his fingers. discussed tubing with pt and he would like to try it. would also assess useof u-cuff with wrist support. Pt will need to be positioned in gravity eliminated positions to improve function     Vision     Perception     Praxis      Pertinent Vitals/Pain Pain Assessment: Faces Faces Pain Scale: Hurts a little bit Pain Location: with passive stretch with B wrists/elbows     Hand Dominance     Extremity/Trunk Assessment Upper Extremity Assessment Upper Extremity Assessment: RUE deficits/detail;LUE deficits/detail RUE Deficits / Details: Contactures throughout RUE. shoulder PROM is WFL grossly. elbow ext contracture @ 30 degrees; wrist conracted into @ 30 flexion. unable to achieve neutal position. swan neck deformities 3rd/4th digits unable to make full fist. extensor tightness in all digits, greater with index finger. limited ROM thumb.Strength is limited throughout RUE, but greater with gravity eliminated. RUE is more functional.  RUE Coordination: decreased fine motor;decreased gross motor May benefit from static lightweight wrist spint for function. Need to assess possible use  of resting hand splint for night use. LUE Deficits / Details: elbow flexion contracture @ 10-100; wrist extension contracture - limited wrist flexion. digit contractures.  LUE Coordination: decreased fine motor;decreased gross motor   Lower Extremity Assessment Lower Extremity Assessment: Generalized weakness (contractures BLE, especially B ankle inverted/supinated)   Cervical / Trunk Assessment Cervical / Trunk Assessment: Other exceptions (poor trunk control. leaning laterally in bed. poor head cont)   Communication Communication Communication: No difficulties   Cognition Arousal/Alertness:  Awake/alert Behavior During Therapy: WFL for tasks assessed/performed Overall Cognitive Status: Within Functional Limits for tasks assessed                     General Comments       Exercises       Shoulder Instructions      Home Living Family/patient expects to be discharged to:: Private residence Living Arrangements: Parent Available Help at Discharge: Family;Available 24 hours/day Type of Home: House Home Access: Ramped entrance     Home Layout: One level               Home Equipment: Wheelchair - power          Prior Functioning/Environment Level of Independence: Needs assistance  Gait / Transfers Assistance Needed: Pt operates w/c with joystick.Dad lifts pt into chair. ADL's / Homemaking Assistance Needed: Family assists with all ADL. Pt feeds self when up in chair.  Communication / Swallowing Assistance Needed: no difficulty      OT Diagnosis: Generalized weakness   OT Problem List: Decreased strength;Decreased range of motion;Impaired balance (sitting and/or standing);Decreased coordination;Decreased knowledge of use of DME or AE;Cardiopulmonary status limiting activity;Impaired tone;Impaired UE functional use;Pain   OT Treatment/Interventions: Self-care/ADL training;Therapeutic exercise;DME and/or AE instruction;Splinting;Therapeutic activities;Patient/family education    OT Goals(Current goals can be found in the care plan section) Acute Rehab OT Goals Patient Stated Goal: to do what I can OT Goal Formulation: With patient/family Time For Goal Achievement: 05/23/16 Potential to Achieve Goals: Good ADL Goals Pt/caregiver will Perform Home Exercise Program: Increased ROM;Both right and left upper extremity;With written HEP provided;With Supervision (Mom/Dad complete BUE PROM program to prevent further contrac) Additional ADL Goal #1: Family will demonstrate correct positioning of B feet in prevalon boots to decrease further pressureof B  ankles Additional ADL Goal #2: Pt will dmeonstarte ability to feed sefl with use of AE with mod A  OT Frequency: Min 2X/week   Barriers to D/C:            Co-evaluation              End of Session Nurse Communication: Mobility status;Other (comment) (need for prevalon boots, soft touch call bell and outpt OT)  Activity Tolerance: Patient tolerated treatment well Patient left: in bed;with call bell/phone within reach;with family/visitor present   Time: 6659-9357 OT Time Calculation (min): 30 min Charges:  OT General Charges $OT Visit: 1 Procedure OT Evaluation $OT Eval Moderate Complexity: 1 Procedure OT Treatments $Therapeutic Activity: 8-22 mins G-Codes:    Neal Trulson,HILLARY 2016/05/31, 4:18 PM   [facility], OTR/L  [phone] 31-May-2016

## 2016-05-09 NOTE — Progress Notes (Signed)
Pt. Achieved a goal of -15 on NIF & .4 on vital capacity with good effort. Pt.'s results may be below average due to not being able to get an adequate seal.

## 2016-05-09 NOTE — Progress Notes (Signed)
PT Cancellation Note  Patient Details Name: Tyri Elmore MRN: [id number] DOB: [birth year]   Cancelled Treatment:    Reason Eval/Treat Not Completed: PT screened, no needs identified, will sign off. Pt not appropriate for acute PT at this time, though would benefit from OT eval for addressing increasing upper extremity function. Have requested order from MD.   Leighton Roach, [place]  Leighton Roach 05/09/2016, 11:58 AM

## 2016-05-09 NOTE — Progress Notes (Signed)
RT note: Respiratory Parameters obtained: FVC-0.8L/NIF-(-16)cmh20, both best of three, good effort, RT to monitor.

## 2016-05-10 DIAGNOSIS — E876 Hypokalemia: Secondary | ICD-10-CM

## 2016-05-10 LAB — CBC
HCT: 37.4 % — ABNORMAL LOW (ref 39.0–52.0)
Hemoglobin: 12.1 g/dL — ABNORMAL LOW (ref 13.0–17.0)
MCH: 29 pg (ref 26.0–34.0)
MCHC: 32.4 g/dL (ref 30.0–36.0)
MCV: 89.7 fL (ref 78.0–100.0)
PLATELETS: 130 10*3/uL — AB (ref 150–400)
RBC: 4.17 MIL/uL — ABNORMAL LOW (ref 4.22–5.81)
RDW: 13.3 % (ref 11.5–15.5)
WBC: 4 10*3/uL (ref 4.0–10.5)

## 2016-05-10 LAB — RENAL FUNCTION PANEL
Albumin: 3.1 g/dL — ABNORMAL LOW (ref 3.5–5.0)
Anion gap: 4 — ABNORMAL LOW (ref 5–15)
CHLORIDE: 107 mmol/L (ref 101–111)
CO2: 28 mmol/L (ref 22–32)
Calcium: 8 mg/dL — ABNORMAL LOW (ref 8.9–10.3)
Glucose, Bld: 79 mg/dL (ref 65–99)
POTASSIUM: 2.5 mmol/L — AB (ref 3.5–5.1)
Phosphorus: 3.1 mg/dL (ref 2.5–4.6)
Sodium: 139 mmol/L (ref 135–145)

## 2016-05-10 LAB — POTASSIUM: Potassium: 3.7 mmol/L (ref 3.5–5.1)

## 2016-05-10 LAB — MAGNESIUM: Magnesium: 1.6 mg/dL — ABNORMAL LOW (ref 1.7–2.4)

## 2016-05-10 MED ORDER — LOPERAMIDE HCL 2 MG PO CAPS: 2.0000 mg | ORAL_CAPSULE | ORAL | Status: DC | PRN

## 2016-05-10 MED ORDER — MAGNESIUM SULFATE 2 GM/50ML IV SOLN
2.0000 g | Freq: Once | INTRAVENOUS | Status: AC
  Administered 2016-05-10: 2 g via INTRAVENOUS
  Filled 2016-05-10: qty 50

## 2016-05-10 MED ORDER — POTASSIUM CHLORIDE CRYS ER 20 MEQ PO TBCR
40.0000 meq | EXTENDED_RELEASE_TABLET | Freq: Once | ORAL | Status: AC
  Administered 2016-05-10: 40 meq via ORAL
  Filled 2016-05-10: qty 2

## 2016-05-10 NOTE — Progress Notes (Signed)
VC .7L, NIF -12 Good pt effort

## 2016-05-10 NOTE — Progress Notes (Signed)
Occupational Therapy Treatment Note  Pt just back to bed from w/c.  Provided pt with AD for self feeing.  Discussed follow up OPOT with pt and father as well as vocation rehab and the assistive technology center.    Recommend follow up OPOT (neuro rehab center),    05/10/16 1837  OT Visit Information  Last OT Received On 05/10/16  Assistance Needed +2  History of Present Illness Woodley Petzold is a 20 y.o. male with significant past medical history for Duchenne muscular dystrophy and wheelchair-bound, presents to [facility] ER complaining of sudden onset and gradual worsening nausea, sore throat, rhinorrhea, as well as mild diarrhea. Blood culture grew gram + rods in anaerobic bottle. Tachycardic. Concern for sepsis.   Precautions  Precautions Fall;Other (comment)  Pain Assessment  Pain Assessment No/denies pain  Cognition  Arousal/Alertness Awake/alert  Behavior During Therapy WFL for tasks assessed/performed  Overall Cognitive Status Within Functional Limits for tasks assessed  ADL  General ADL Comments returned to assess pt up in wheelchair.  Father reports pt was up for about an hour and just returned to bed.  Spoke with pt and father.  Pt reports he is able to maneuver w/c at his baseline.  Discussed community resources such as vocational rehab and [place] [facility] with pt and father - they would like info to ensure they make contact with these resources.  Provided pt and father with foam build up for utensils and instructed them in its use.  Instructed pt to attempt to use it next meal if he is up in his wheelchair to determine if it works better for him than his current method.  They verbalized understanding   OT - End of Session  Activity Tolerance Patient tolerated treatment well  Patient left in bed;with call bell/phone within reach;with family/visitor present  OT Assessment/Plan  OT Plan Discharge plan remains appropriate  OT Frequency (ACUTE ONLY) Min 2X/week  Follow  Up Recommendations Outpatient OT;Supervision/Assistance - 24 hour  OT Equipment None recommended by OT  OT Goal Progression  Progress towards OT goals Progressing toward goals  OT Time Calculation  OT Start Time (ACUTE ONLY) 1644  OT Stop Time (ACUTE ONLY) 1700  OT Time Calculation (min) 16 min  OT General Charges  $OT Visit 1 Procedure  OT Treatments  $Self Care/Home Management  8-22 mins  [facility], OTR/L [phone]

## 2016-05-10 NOTE — Progress Notes (Signed)
Occupational Therapy Treatment Patient Details Name: Peter Hayes MRN: [id number] DOB: 04-18-1996 Today's Date: 05/10/2016    History of present illness Peter Hayes is a 20 y.o. male with significant past medical history for Duchenne muscular dystrophy and wheelchair-bound, presents to [facility] ER complaining of sudden onset and gradual worsening nausea, sore throat, rhinorrhea, as well as mild diarrhea. Blood culture grew gram + rods in anaerobic bottle. Tachycardic. Concern for sepsis.    OT comments  PROM, AAROM and AROM performed bil. UEs.  Pt with multiple contractures throughout bil. UEs and may benefit from splinting to improve ROM and function, however, must proceed carefully as not to impede his current level of functioning.  Pt performs all tasks (self feeding, gaming, computer operation/school work) while sitting up in chair as it allows for optimal UE positioning and function.  Pt is currently in bed with room full of visitors.  Will return later to assist with transfer to chair to better assess pt's current UE function and determine AE and splinting needs.  Pt will greatly benefit from OPOT (at neuro rehab center) to continue with possible UE splinting, and would benefit from referral to community resources to maximize his ability to function as independently as possible i.e. Vocational rehab, and the assistive technology center.   Will continue to follow   Follow Up Recommendations  Outpatient OT;Supervision/Assistance - 24 hour - also recommend referral to vocational rehab and the [place] assistive technology center.     Equipment Recommendations  None recommended by OT    Recommendations for Other Services      Precautions / Restrictions Precautions Precautions: Fall;Other (comment)       Mobility Bed Mobility                  Transfers                      Balance                                   ADL                                         General ADL Comments: Pt reports he performs most functional acitivities - self feeding, gaming, school work when up in his wheelchair as he is reliant on positioning of UEs in gravity assisted position.  With pt in bed, unable to fully assess pt's ability to perform these tasks to determine how splinting or assistive devices may or may not help him.  Pt with multiple visitors - I performed ROM exercises with him and made plan to return later and get pt up in wheelchair to better assess functional use of UEs - pt very agreeable to this plan       Vision                     Perception     Praxis      Cognition   Behavior During Therapy: [facility] for tasks assessed/performed Overall Cognitive Status: Within Functional Limits for tasks assessed                       Extremity/Trunk Assessment               Exercises Other  Exercises Other Exercises: AAROM performed shoulder flex/extension, finger flexion and elbow flex/extension bil. UEs x 15 reps each.  soft tissue mobilization, with joint realignment and passive followed by active assisted stretch to bil. fingers into flexion and to bil. wrists.    Shoulder Instructions       General Comments      Pertinent Vitals/ Pain       Pain Assessment: No/denies pain  Home Living                                          Prior Functioning/Environment              Frequency Min 2X/week     Progress Toward Goals  OT Goals(current goals can now be found in the care plan section)  Progress towards OT goals: Progressing toward goals  ADL Goals Pt/caregiver will Perform Home Exercise Program: Increased ROM;Both right and left upper extremity;With written HEP provided;With Supervision Additional ADL Goal #1: Family will demonstrate correct positioning of B feet in prevalon boots to decrease further pressureof B ankles Additional ADL Goal #2: Pt will dmeonstarte ability to feed sefl  with use of AE with mod A  Plan Discharge plan remains appropriate    Co-evaluation                 End of Session     Activity Tolerance Patient tolerated treatment well   Patient Left in bed;with call bell/phone within reach;with family/visitor present   Nurse Communication          Time: 8453-6468 OT Time Calculation (min): 32 min  Charges: OT General Charges $OT Visit: 1 Procedure OT Treatments $Neuromuscular Re-education: 23-37 mins  Malon Siddall M 05/10/2016, 6:32 PM

## 2016-05-10 NOTE — Progress Notes (Signed)
PROGRESS NOTE  Peter Hayes  WCB:[id number] DOB: 1996/04/18 DOA: 05/04/2016 PCP: Pcp Not In System  Brief Narrative:   Peter Hayes is a 20 y.o. male with significant past medical history muscular dystrophy and wheelchair-bound who presented to [facility] ER complaining of sudden onset and gradual worsening nausea, sore throat, rhinorrhea, as well as mild diarrhea.  His parents felt that he appeared to be breathing fast so they brought him to the ER.  [name] are his caretakers.  Other than nausea, the patient stated that he was overall feeling better.  Due to concern for sepsis, he was given vancomycin and zosyn and 4L NS.  Diarrhea has slowed, he is eating better.  Tachycardia persists.    Assessment & Plan:   Principal Problem:   Sepsis ([place]) Active Problems:   Muscular dystrophy ([place])   Moderate protein-calorie malnutrition (H[facility])   Tachycardia   Leukocytosis   High anion gap metabolic acidosis   Respiratory alkalosis  Sepsis due to probable gastroenteritis suggested by "adynamic ileus" on CT despite hyperactive BS on exam.  CXR and UA unremarkable.   -  Antibiotics discontinued on 7/30  -  Blood culture:  S. Epidermidus, likely contaminate -  Rapid strep negative -  Monospot negative -  Procalcitonin negative -  Continue scheduled zofran and phenergan prn  Anion gap metabolic acidosis due to dehydration and diarrhea, resolved with IVF -  Repeat BMP in AM  Diarrhea - less than 3 bouts of diarrhea in the last 48 hours per my discussion with father. I don't suspect c diff as such will start imodium.  Severe protein calorie malnutrition due to acute illness -  Nutrition consultation -  Supplements  Hypokalemia, hypomagnesemia, and hypophosphatemia suggest chronic malnutrition vs. Acute losses.  - replete and reassess - Hypokalemia at 2.5 today  Sinus tachycardia.  Has received 8-10L of IVF resuscitation (most of which has not been recorded).  TSH wnl and D-dimer  negative.  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  Patient remains asymptomatic.  HR increases to 140s when sitting in chair -  Additional 2L bolus and IVF  -  ECHO demonstrated preserved EF -  Spoke with Muscular Dystrophy specialist at [facility] about whether she often sees chronic tachycardia in a DMD patient in the setting of malnutrition, muscular wasting, and she stated that it is NOT typical and recommended cardiology consultation -  Cardiology consult placed to exclude DMD-related tachyarrhythmia - EP consulted for further evaluation.  Muscular dystrophy with difficulty swallowing, worsened weakness probably due to viral illness -  Steroid burst x 2 doses -  PT eval -  NIF decreasing:  -12cmH2O and VC 0.7L -  Needs follow up at [facility] MD clinic  Sore throat with denuded soft palate, improving -  continue Clotrimazole -  Magic mouthwash trial -  Appreciate SLP re-evaluation  Mild anemia and thrombocytopenia are likely hemodilutional.   -  Repeat CBC in AM  DVT prophylaxis:  lovenox Code Status:  full Family Communication:  Patient and his brother at bedside Disposition Plan:  Likely home once seen by EP   Consultants:   SLP  Procedures:  none  Antimicrobials:   Vancomycin 7/29 > 7/30  Zosyn 7/29   Subjective:  No acute issues overnight. Pt has had some diarrhea  Objective: Vitals:   05/09/16 1302 05/09/16 2205 05/10/16 0613 05/10/16 1418  BP: 136/89 116/77 (!) 137/93 115/71  Pulse: (!) 106 86 97 89  Resp: '19 16 16 19  '$ Temp: 97.9 F (36.6 C) 98.3 F (36.8 C) 98.2 F (36.8 C) 97.6 F (36.4 C)  TempSrc: Oral Oral Oral Oral  SpO2: 100% 95% 100% 97%  Weight:      Height:        Intake/Output Summary (Last 24 hours) at 05/10/16 1617 Last data filed at 05/10/16 0700  Gross per 24 hour  Intake              200 ml  Output                0 ml  Net              200 ml   Filed Weights   05/04/16 2244 05/05/16  0646 05/07/16 1535  Weight: 49.9 kg (110 lb) 37.7 kg (83 lb 1.8 oz) 40.1 kg (88 lb 6.5 oz)    Examination:  General exam:  Awake and alert and in nad. HEENT:  NCAT, MMM Respiratory system: Clear to auscultation bilaterally, no wheezes Cardiovascular system:  Tachycardic, regular rhythm, normal S1/S2. No murmurs, rubs, gallops or clicks.  Warm extremities Gastrointestinal system:  Normal active active bowel sounds, soft, nondistended, nontender.  [place] than yesterday MSK:  Decreased tone and bulk, left hand somewhat contracted and both feet are contracted.  No lower extremity edema Neuro:  Diffusely weak, lying in bed   Data Reviewed: I have personally reviewed following labs and imaging studies  CBC:  Recent Labs Lab 05/05/16 0030 05/06/16 0228 05/07/16 0434 05/08/16 0436 05/09/16 0404 05/10/16 0430  WBC 29.1* 8.1 5.5 4.7 4.0 4.0  NEUTROABS 27.0*  --   --   --   --   --   HGB 15.3 12.9* 11.8* 12.8* 12.2* 12.1*  HCT 45.5 39.2 35.8* 39.4 38.1* 37.4*  MCV 88.5 88.1 87.1 88.9 90.1 89.7  PLT 285 164 142* 137* 130* 250*   Basic Metabolic Panel:  Recent Labs Lab 05/06/16 0228 05/07/16 0434 05/08/16 0416 05/09/16 0404 05/10/16 0430  NA 138 134* 140 139 139  K 2.3* 3.2* 3.3* 3.2* 2.5*  CL 111 104 102 105 107  CO2 19* $Remov'24 27 27 28  'ESQuDv$ GLUCOSE 114* 287* 67 69 79  BUN <5* <5* <5* <5* <5*  CREATININE <0.30* <0.30* <0.30* <0.30* <0.30*  CALCIUM 8.6* 7.9* 8.8* 8.3* 8.0*  MG 1.5* 2.0 2.0 1.6* 1.6*  PHOS 1.4* 1.5* 2.4* 3.2 3.1   GFR: CrCl cannot be calculated (This lab value cannot be used to calculate CrCl because it is not a number: <0.30). Liver Function Tests:  Recent Labs Lab 05/05/16 0030 05/07/16 0434 05/08/16 0416 05/09/16 0404 05/10/16 0430  AST 38  --   --   --   --   ALT 27  --   --   --   --   ALKPHOS 77  --   --   --   --   BILITOT 2.3*  --   --   --   --   PROT 8.0  --   --   --   --   ALBUMIN 5.0 3.2* 3.4* 3.1* 3.1*   No results for input(s): LIPASE,  AMYLASE in the last 168 hours. No results for input(s): AMMONIA in the last 168 hours. Coagulation Profile: No results for input(s): INR, PROTIME in the last 168 hours. Cardiac Enzymes:  Recent Labs Lab 05/07/16 0434  CKTOTAL 202   BNP (last 3 results) No results for input(s): PROBNP in the last  8760 hours. HbA1C: No results for input(s): HGBA1C in the last 72 hours. CBG:  Recent Labs Lab 05/05/16 0304  GLUCAP 202*   Lipid Profile: No results for input(s): CHOL, HDL, LDLCALC, TRIG, CHOLHDL, LDLDIRECT in the last 72 hours. Thyroid Function Tests: No results for input(s): TSH, T4TOTAL, FREET4, T3FREE, THYROIDAB in the last 72 hours. Anemia Panel: No results for input(s): VITAMINB12, FOLATE, FERRITIN, TIBC, IRON, RETICCTPCT in the last 72 hours. Urine analysis:    Component Value Date/Time   COLORURINE YELLOW 05/05/2016 0236   APPEARANCEUR CLEAR 05/05/2016 0236   LABSPEC 1.019 05/05/2016 0236   PHURINE 5.5 05/05/2016 0236   GLUCOSEU 500 (A) 05/05/2016 0236   HGBUR NEGATIVE 05/05/2016 0236   BILIRUBINUR SMALL (A) 05/05/2016 0236   KETONESUR >80 (A) 05/05/2016 0236   PROTEINUR NEGATIVE 05/05/2016 0236   NITRITE NEGATIVE 05/05/2016 0236   LEUKOCYTESUR NEGATIVE 05/05/2016 0236   Sepsis Labs: $RemoveBefo'@LABRCNTIP'jaATBFlBenS$ (procalcitonin:4,lacticidven:4)  ) Recent Results (from the past 240 hour(s))  Blood Culture (routine x 2)     Status: Abnormal (Preliminary result)   Collection Time: 05/05/16 12:25 AM  Result Value Ref Range Status   Specimen Description BLOOD NECK  Final   Special Requests BOTTLES DRAWN AEROBIC AND ANAEROBIC 5CC  Final   Culture  Setup Time   Final    GRAM POSITIVE COCCI IN CLUSTERS AEROBIC BOTTLE ONLY CRITICAL RESULT CALLED TO, READ BACK BY AND VERIFIED WITH: M TURNER,PHARMD AT 1710 05/06/16 BY L BENFIELD GRAM POSITIVE RODS ANAEROBIC BOTTLE ONLY CRITICAL RESULT CALLED TO, READ BACK BY AND VERIFIED WITH: L. [facility] RN AT 2109 05/08/16 BY D. VANHOOK    Culture (A)   Final    STAPHYLOCOCCUS SPECIES THE SIGNIFICANCE OF ISOLATING THIS ORGANISM FROM A SINGLE VENIPUNCTURE CANNOT BE PREDICTED WITHOUT FURTHER CLINICAL AND CULTURE CORRELATION. SUSCEPTIBILITIES AVAILABLE ONLY ON REQUEST. GRAM POSITIVE RODS CULTURE REINCUBATED FOR BETTER GROWTH Performed at [facility]    Report Status PENDING  Incomplete  Blood Culture ID Panel (Reflexed)     Status: Abnormal   Collection Time: 05/05/16 12:25 AM  Result Value Ref Range Status   Enterococcus species NOT DETECTED NOT DETECTED Final   Vancomycin resistance NOT DETECTED NOT DETECTED Final   Listeria monocytogenes NOT DETECTED NOT DETECTED Final   Staphylococcus species DETECTED (A) NOT DETECTED Final    Comment: CRITICAL RESULT CALLED TO, READ BACK BY AND VERIFIED WITH: M TURNER,PHARMD AT 1710 05/06/16 BY L BENFIELD    Staphylococcus aureus NOT DETECTED NOT DETECTED Final   Methicillin resistance NOT DETECTED NOT DETECTED Final   Streptococcus species NOT DETECTED NOT DETECTED Final   Streptococcus agalactiae NOT DETECTED NOT DETECTED Final   Streptococcus pneumoniae NOT DETECTED NOT DETECTED Final   Streptococcus pyogenes NOT DETECTED NOT DETECTED Final   Acinetobacter baumannii NOT DETECTED NOT DETECTED Final   Enterobacteriaceae species NOT DETECTED NOT DETECTED Final   Enterobacter cloacae complex NOT DETECTED NOT DETECTED Final   Escherichia coli NOT DETECTED NOT DETECTED Final   Klebsiella oxytoca NOT DETECTED NOT DETECTED Final   Klebsiella pneumoniae NOT DETECTED NOT DETECTED Final   Proteus species NOT DETECTED NOT DETECTED Final   Serratia marcescens NOT DETECTED NOT DETECTED Final   Carbapenem resistance NOT DETECTED NOT DETECTED Final   Haemophilus influenzae NOT DETECTED NOT DETECTED Final   Neisseria meningitidis NOT DETECTED NOT DETECTED Final   Pseudomonas aeruginosa NOT DETECTED NOT DETECTED Final   Candida albicans NOT DETECTED NOT DETECTED Final   Candida glabrata NOT DETECTED  NOT DETECTED  Final   Candida krusei NOT DETECTED NOT DETECTED Final   Candida parapsilosis NOT DETECTED NOT DETECTED Final   Candida tropicalis NOT DETECTED NOT DETECTED Final    Comment: Performed at [facility]  Urine culture     Status: None   Collection Time: 05/05/16  2:36 AM  Result Value Ref Range Status   Specimen Description URINE, RANDOM  Final   Special Requests NONE  Final   Culture NO GROWTH Performed at [facility]   Final   Report Status 05/06/2016 FINAL  Final  MRSA PCR Screening     Status: None   Collection Time: 05/05/16  6:51 AM  Result Value Ref Range Status   MRSA by PCR NEGATIVE NEGATIVE Final    Comment:        The GeneXpert MRSA Assay (FDA approved for NASAL specimens only), is one component of a comprehensive MRSA colonization surveillance program. It is not intended to diagnose MRSA infection nor to guide or monitor treatment for MRSA infections.   Rapid strep screen (not at [facility])     Status: None   Collection Time: 05/05/16 10:33 AM  Result Value Ref Range Status   Streptococcus, Group A Screen (Direct) NEGATIVE NEGATIVE Final    Comment: (NOTE) A Rapid Antigen test may result negative if the antigen level in the sample is below the detection level of this test. The FDA has not cleared this test as a stand-alone test therefore the rapid antigen negative result has reflexed to a Group A Strep culture.   Culture, group A strep     Status: None   Collection Time: 05/05/16 10:33 AM  Result Value Ref Range Status   Specimen Description THROAT  Final   Special Requests NONE Reflexed from [id number]  Final   Culture NO GROUP A STREP (S.PYOGENES) ISOLATED  Final   Report Status 05/07/2016 FINAL  Final      Radiology Studies: No results found.   Scheduled Meds: . clotrimazole  10 mg Oral 5 X Daily  . enoxaparin (LOVENOX) injection  30 mg Subcutaneous Q24H  . feeding supplement  1 Container Oral Q24H  . feeding supplement (ENSURE  ENLIVE)  237 mL Oral BID BM  . LORazepam  1 mg Oral Once  . ondansetron (ZOFRAN) IV  8 mg Intravenous Q8H  . sodium chloride  2,000 mL Intravenous Once  . sodium chloride flush  3 mL Intravenous Q12H  . thiamine injection  100 mg Intravenous Daily   Continuous Infusions: . sodium chloride 150 mL/hr at 05/09/16 1055     LOS: 5 days    Time spent: 30 min  Velvet Bathe, MD [facility] Pager [phone]  If 7PM-7AM, please contact night-coverage [email or web address] Password [email or web address] 05/10/2016, 4:17 PM

## 2016-05-10 NOTE — Progress Notes (Signed)
VC .8L and NIF -14cmH20  Good effort

## 2016-05-10 NOTE — Progress Notes (Signed)
Nutrition Follow-up  DOCUMENTATION CODES:   Underweight  INTERVENTION:   -Continue Ensure Enlive po BID, each supplement provides 350 kcal and 20 grams of protein  -Continue Boost Breeze po daily, each supplement provides 250 kcal and 9 grams of protein  NUTRITION DIAGNOSIS:   Increased nutrient needs related to acute illness as evidenced by estimated needs.  Progressing  GOAL:   Patient will meet greater than or equal to 90% of their needs  Progressing  MONITOR:   PO intake, Supplement acceptance, Labs, Weight trends, I & O's  REASON FOR ASSESSMENT:   Consult Assessment of nutrition requirement/status  ASSESSMENT:   20 y.o.Malewith significant PMH of muscular dystrophy and wheelchair-bound [place] [facility] onset and gradual worsening nausea, sore throat, rhinorrhea, as well as mild diarrhea.  His parents felt that he appeared to be breathing fast so they brought him to the ER.  [name] are his caretakers.  Other than nausea, the patient stated that he was overall feeling better.  Due to concern for sepsis, he was given vancomycin and zosyn and 4L NS.  He has remained tachycardic and has severe electrolyte imbalance.    Pt transferred from SDU to medical floor on 05/07/16.   Pt unavailable at time of visit.   Case discussed with RN. She reports intake has improved since earlier this week (meal completion 10-100%). Pt is taking approximately 1-2 supplements daily. Pt refused this morning's dose of Boost Breeze, however, did consume Ensure when provided. RN reports that pt is often selective about taking PO's.   Labs reviewed: K: 2.5, Mg: 1.6, Phos WDL. Per RN, plan to replace.  Diet Order:  DIET DYS 3 Room service appropriate? Yes; Fluid consistency: Thin  Skin:  Reviewed, no issues  Last BM:  05/09/16  Height:   Ht Readings from Last 1 Encounters:  05/07/16 $RemoveB'5\' 4"'MfssEsPd$  (1.626 m)    Weight:   Wt Readings from Last 1  Encounters:  05/07/16 88 lb 6.5 oz (40.1 kg)    Ideal Body Weight:  56.3 kg  BMI:  Body mass index is 15.17 kg/m.  Estimated Nutritional Needs:   Kcal:  1000-1200  Protein:  50-60 gm  Fluid:  >/= 1.5 L  EDUCATION NEEDS:   No education needs identified at this time  Tajee Savant A. Jimmye Norman, RD, LDN, CDE Pager: [phone] After hours Pager: [phone]

## 2016-05-10 NOTE — Progress Notes (Addendum)
CRITICAL VALUE ALERT  Critical value received:  K+ 2.5  Date of notification:  05/10/16  Time of notification:  0508  Critical value read back:Yes.    Nurse who received alert:  Hortencia Conradi RN   MD notified (1st page):  Baltazar Najjar NP  Time of first page:  [phone]  MD notified (2nd page): Baltazar Najjar NP  Time of second page: [phone]  Responding MD:  Baltazar Najjar NP  Time MD responded:

## 2016-05-11 DIAGNOSIS — E873 Alkalosis: Secondary | ICD-10-CM

## 2016-05-11 LAB — RENAL FUNCTION PANEL
ALBUMIN: 3.1 g/dL — AB (ref 3.5–5.0)
ANION GAP: 5 (ref 5–15)
BUN: <5 mg/dL — ABNORMAL LOW (ref 6–20)
CALCIUM: 8.3 mg/dL — AB (ref 8.9–10.3)
CO2: 28 mmol/L (ref 22–32)
Chloride: 106 mmol/L (ref 101–111)
Glucose, Bld: 91 mg/dL (ref 65–99)
POTASSIUM: 3.8 mmol/L (ref 3.5–5.1)
Phosphorus: 3.1 mg/dL (ref 2.5–4.6)
Sodium: 139 mmol/L (ref 135–145)

## 2016-05-11 LAB — CBC
HEMATOCRIT: 37.8 % — AB (ref 39.0–52.0)
Hemoglobin: 11.8 g/dL — ABNORMAL LOW (ref 13.0–17.0)
MCH: 28.4 pg (ref 26.0–34.0)
MCHC: 31.2 g/dL (ref 30.0–36.0)
MCV: 90.9 fL (ref 78.0–100.0)
Platelets: 128 10*3/uL — ABNORMAL LOW (ref 150–400)
RBC: 4.16 MIL/uL — ABNORMAL LOW (ref 4.22–5.81)
RDW: 13.5 % (ref 11.5–15.5)
WBC: 4 10*3/uL (ref 4.0–10.5)

## 2016-05-11 LAB — BASIC METABOLIC PANEL
Anion gap: 5 (ref 5–15)
CHLORIDE: 106 mmol/L (ref 101–111)
CO2: 28 mmol/L (ref 22–32)
Calcium: 8.3 mg/dL — ABNORMAL LOW (ref 8.9–10.3)
Creatinine, Ser: <0.3 mg/dL — ABNORMAL LOW (ref 0.61–1.24)
GLUCOSE: 92 mg/dL (ref 65–99)
POTASSIUM: 3.8 mmol/L (ref 3.5–5.1)
SODIUM: 139 mmol/L (ref 135–145)

## 2016-05-11 LAB — MAGNESIUM: Magnesium: 2 mg/dL (ref 1.7–2.4)

## 2016-05-11 LAB — CULTURE, BLOOD (ROUTINE X 2)

## 2016-05-11 MED ORDER — CLOTRIMAZOLE 10 MG MT TROC: 10.0000 mg | Freq: Every day | OROMUCOSAL | 0 refills | Status: DC

## 2016-05-11 NOTE — Discharge Summary (Signed)
Physician Discharge Summary  Lemarcus Baggerly NGE:[id number] DOB: [birth year] DOA: 05/04/2016  PCP: Pcp Not In System  Admit date: 05/04/2016 Discharge date: 05/11/2016  Time spent: 35 minutes  Recommendations for Outpatient Follow-up:  1. Monitor K and magnesium levels   Discharge Diagnoses:  Principal Problem:   Sepsis ([place]) Active Problems:   Muscular dystrophy ([place])   Moderate protein-calorie malnutrition ([facility])   Tachycardia   Leukocytosis   High anion gap metabolic acidosis   Respiratory alkalosis   Discharge Condition: stable  Diet recommendation:   Filed Weights   05/04/16 2244 05/05/16 0646 05/07/16 1535  Weight: 49.9 kg (110 lb) 37.7 kg (83 lb 1.8 oz) 40.1 kg (88 lb 6.5 oz)    History of present illness:  Curly Rim a 20 y.o.malewith significant past medical history muscular dystrophy and wheelchair-bound who [facility] of sudden onset and gradual worsening nausea, sore throat, rhinorrhea, as well as mild diarrhea.  His parents felt that he appeared to be breathing fast so they brought him to the ER.  [name] are his caretakers.   Hospital Course:  Sepsis due to probable gastroenteritis suggested by "adynamic ileus" on CT despite hyperactive BS on exam.  CXR and UA unremarkable.   -  Antibiotics discontinued on 7/30  -  Blood culture:  S. Epidermidus, likely contaminate -  Rapid strep negative -  Monospot negative -  Procalcitonin negative -  Continue scheduled zofran and phenergan prn  Anion gap metabolic acidosis due to dehydration and diarrhea, resolved with IVF -  Repeat BMP in AM  Diarrhea - less than 3 bouts of diarrhea in the last 48 hours per my discussion with father. I don't suspect c diff. Improved with imodium dose and patient and family deny any diarrhea on day of d/c - most likely viral mediated.  Severe protein calorie malnutrition due to acute illness -  Pt had Supplements while in house.  Hypokalemia,  hypomagnesemia, and hypophosphatemia suggest chronic malnutrition vs. Acute losses.  - resolved after repletion and most likely was secondary to viral gastroenteritis.  Sinus tachycardia.  Has received 8-10L of IVF resuscitation (most of which has not been recorded).  TSH wnl and D-dimer negative.  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  Patient remains asymptomatic.  HR increases to 140s when sitting in chair -  Additional 2L bolus and IVF  -  ECHO demonstrated preserved EF -  Spoke with Muscular Dystrophy specialist at [facility] about whether she often sees chronic tachycardia in a DMD patient in the setting of malnutrition, muscular wasting, and she stated that it is NOT typical and recommended cardiology consultation - EP consulted for further evaluation and recommended no further intervention  Muscular dystrophy with difficulty swallowing, worsened weakness probably due to viral illness -  Needs follow up at [facility] MD clinic  Sore throat with denuded soft palate, improving -  continue Clotrimazole   Procedures:  None  Consultations:  EP  Discharge Exam: Vitals:   05/10/16 2150 05/11/16 0535  BP: 121/83 97/62  Pulse: (!) 104 92  Resp: 18 18  Temp: 98.1 F (36.7 C) 97.6 F (36.4 C)    General: Pt in nad, alert and awake Cardiovascular: rrr, no rubs Respiratory: no increased wob, no wheezes  Discharge Instructions   Discharge Instructions    Call MD for:  difficulty breathing, headache or visual disturbances    Complete by:  As directed   Call MD for:  persistant  nausea and vomiting    Complete by:  As directed   Call MD for:  temperature >100.4    Complete by:  As directed   Diet - low sodium heart healthy    Complete by:  As directed   Discharge instructions    Complete by:  As directed   Please follow up with your primary care physician in 1-2 weeks or sooner should any new concerns arise.   Increase activity slowly     Complete by:  As directed     Current Discharge Medication List    START taking these medications   Details  clotrimazole (MYCELEX) 10 MG troche Take 1 tablet (10 mg total) by mouth 5 (five) times daily. Qty: 20 tablet, Refills: 0      CONTINUE these medications which have NOT CHANGED   Details  Cholecalciferol (VITAMIN D PO) Take 2 tablets by mouth daily after breakfast.    Coenzyme Q10 (COQ10) 100 MG CAPS Take 100 mg by mouth daily after breakfast.    montelukast (SINGULAIR) 10 MG tablet Take 10 mg by mouth at bedtime.    ofloxacin (OCUFLOX) 0.3 % ophthalmic solution Place 2 drops into both eyes 2 (two) times daily.     Selenium 100 MCG TABS Take 100 mcg by mouth daily after breakfast.       No Known Allergies    The results of significant diagnostics from this hospitalization (including imaging, microbiology, ancillary and laboratory) are listed below for reference.    Significant Diagnostic Studies: Ct Abdomen Pelvis Wo Contrast  Result Date: 05/05/2016 CLINICAL DATA:  20 year old male with history of muscular dystrophy presenting with fever. Evaluate for pneumoperitoneum. EXAM: CT ABDOMEN AND PELVIS WITHOUT CONTRAST TECHNIQUE: Multidetector CT imaging of the abdomen and pelvis was performed following the standard protocol without IV contrast. COMPARISON:  Chest radiograph dated 05/04/2016 FINDINGS: Evaluation of this exam is limited in the absence of intravenous contrast. The visualized lung bases are clear. No intra-abdominal free air or free fluid. Diffuse fatty infiltration of the liver. The gallbladder is not well visualized, likely contracted. No calcified stone identified. The pancreas, spleen, adrenal glands, kidneys, visualized ureters, and urinary bladder appear unremarkable. The prostate and seminal vesicles are grossly unremarkable. Evaluation of the bowel is limited in the absence of oral contrast. There is air distention of the colon and rectosigmoid without  evidence of mechanical obstruction. There is no evidence of small bowel obstruction or active inflammation. Normal appendix. The abdominal aorta and IVC appear unremarkable on this noncontrast study. No portal venous gas identified. There is no adenopathy. There is diffuse fatty replacement of the musculature compatible with known muscular dystrophy. The bones are osteopenic. There is scoliosis of the spine. Chronic left sacroiliitis. Vacuum phenomena. No acute fracture. IMPRESSION: No acute intra-abdominal pelvic pathology.  No free air. Air distal colon and rectosigmoid without evidence of mechanical obstruction. Findings may represent an adynamic ileus. Clinical correlation is recommended. Electronically Signed   By: Anner Crete M.D.   On: 05/05/2016 02:00  Dg Chest 2 View  Result Date: 05/05/2016 CLINICAL DATA:  20 year old male with fever EXAM: CHEST  2 VIEW COMPARISON:  None FINDINGS: The lungs are clear. There is no pleural effusion or pneumothorax. The cardiac silhouette is within normal limits. No acute osseous pathology identified. Air distended loop of colon noted under the left hemidiaphragm. There is air in the upper abdomen outlining both sides of the colonic wall. Although this likely represents air within adjacent distended bowel, pneumoperitoneum  is not excluded. Correlation with clinical exam is recommended. If there is clinical concern for pneumoperitoneum CT is recommended for further evaluation. IMPRESSION: No active cardiopulmonary disease. Air distended loops of bowel in the upper abdomen. Pneumoperitoneum is not excluded. Correlation with clinical exam recommended. CT may provide better evaluation if there is clinical concern for pneumoperitoneum. Electronically Signed   By: Anner Crete M.D.   On: 05/05/2016 01:03   Microbiology: Recent Results (from the past 240 hour(s))  Blood Culture (routine x 2)     Status: Abnormal   Collection Time: 05/05/16 12:25 AM  Result Value  Ref Range Status   Specimen Description BLOOD NECK  Final   Special Requests BOTTLES DRAWN AEROBIC AND ANAEROBIC 5CC  Final   Culture  Setup Time   Final    GRAM POSITIVE COCCI IN CLUSTERS AEROBIC BOTTLE ONLY CRITICAL RESULT CALLED TO, READ BACK BY AND VERIFIED WITH: M TURNER,PHARMD AT 1710 05/06/16 BY L BENFIELD GRAM POSITIVE RODS ANAEROBIC BOTTLE ONLY CRITICAL RESULT CALLED TO, READ BACK BY AND VERIFIED WITH: L. [facility] RN AT 2109 05/08/16 BY Rush Landmark Performed at [facility]    Culture (A)  Final    STAPHYLOCOCCUS SPECIES (COAGULASE NEGATIVE) THE SIGNIFICANCE OF ISOLATING THIS ORGANISM FROM A SINGLE VENIPUNCTURE CANNOT BE PREDICTED WITHOUT FURTHER CLINICAL AND CULTURE CORRELATION. SUSCEPTIBILITIES AVAILABLE ONLY ON REQUEST. PROPIONIBACTERIUM ACNES    Report Status 05/11/2016 FINAL  Final  Blood Culture ID Panel (Reflexed)     Status: Abnormal   Collection Time: 05/05/16 12:25 AM  Result Value Ref Range Status   Enterococcus species NOT DETECTED NOT DETECTED Final   Vancomycin resistance NOT DETECTED NOT DETECTED Final   Listeria monocytogenes NOT DETECTED NOT DETECTED Final   Staphylococcus species DETECTED (A) NOT DETECTED Final    Comment: CRITICAL RESULT CALLED TO, READ BACK BY AND VERIFIED WITH: M TURNER,PHARMD AT 1710 05/06/16 BY L BENFIELD    Staphylococcus aureus NOT DETECTED NOT DETECTED Final   Methicillin resistance NOT DETECTED NOT DETECTED Final   Streptococcus species NOT DETECTED NOT DETECTED Final   Streptococcus agalactiae NOT DETECTED NOT DETECTED Final   Streptococcus pneumoniae NOT DETECTED NOT DETECTED Final   Streptococcus pyogenes NOT DETECTED NOT DETECTED Final   Acinetobacter baumannii NOT DETECTED NOT DETECTED Final   Enterobacteriaceae species NOT DETECTED NOT DETECTED Final   Enterobacter cloacae complex NOT DETECTED NOT DETECTED Final   Escherichia coli NOT DETECTED NOT DETECTED Final   Klebsiella oxytoca NOT DETECTED NOT DETECTED Final    Klebsiella pneumoniae NOT DETECTED NOT DETECTED Final   Proteus species NOT DETECTED NOT DETECTED Final   Serratia marcescens NOT DETECTED NOT DETECTED Final   Carbapenem resistance NOT DETECTED NOT DETECTED Final   Haemophilus influenzae NOT DETECTED NOT DETECTED Final   Neisseria meningitidis NOT DETECTED NOT DETECTED Final   Pseudomonas aeruginosa NOT DETECTED NOT DETECTED Final   Candida albicans NOT DETECTED NOT DETECTED Final   Candida glabrata NOT DETECTED NOT DETECTED Final   Candida krusei NOT DETECTED NOT DETECTED Final   Candida parapsilosis NOT DETECTED NOT DETECTED Final   Candida tropicalis NOT DETECTED NOT DETECTED Final    Comment: Performed at [facility]  Urine culture     Status: None   Collection Time: 05/05/16  2:36 AM  Result Value Ref Range Status   Specimen Description URINE, RANDOM  Final   Special Requests NONE  Final   Culture NO GROWTH Performed at [facility]   Final   Report  Status 05/06/2016 FINAL  Final  MRSA PCR Screening     Status: None   Collection Time: 05/05/16  6:51 AM  Result Value Ref Range Status   MRSA by PCR NEGATIVE NEGATIVE Final    Comment:        The GeneXpert MRSA Assay (FDA approved for NASAL specimens only), is one component of a comprehensive MRSA colonization surveillance program. It is not intended to diagnose MRSA infection nor to guide or monitor treatment for MRSA infections.   Rapid strep screen (not at [facility])     Status: None   Collection Time: 05/05/16 10:33 AM  Result Value Ref Range Status   Streptococcus, Group A Screen (Direct) NEGATIVE NEGATIVE Final    Comment: (NOTE) A Rapid Antigen test may result negative if the antigen level in the sample is below the detection level of this test. The FDA has not cleared this test as a stand-alone test therefore the rapid antigen negative result has reflexed to a Group A Strep culture.   Culture, group A strep     Status: None   Collection Time:  05/05/16 10:33 AM  Result Value Ref Range Status   Specimen Description THROAT  Final   Special Requests NONE Reflexed from [id number]  Final   Culture NO GROUP A STREP (S.PYOGENES) ISOLATED  Final   Report Status 05/07/2016 FINAL  Final     Labs: Basic Metabolic Panel:  Recent Labs Lab 05/07/16 0434 05/08/16 0416 05/09/16 0404 05/10/16 0430 05/10/16 1600 05/11/16 0520  NA 134* 140 139 139  --  139  139  K 3.2* 3.3* 3.2* 2.5* 3.7 3.8  3.8  CL 104 102 105 107  --  106  106  CO2 $Re'24 27 27 28  'cHr$ --  28  28  GLUCOSE 287* 67 69 79  --  92  91  BUN <5* <5* <5* <5*  --  <5*  <5*  CREATININE <0.30* <0.30* <0.30* <0.30*  --  <0.30*  <0.30*  CALCIUM 7.9* 8.8* 8.3* 8.0*  --  8.3*  8.3*  MG 2.0 2.0 1.6* 1.6*  --  2.0  PHOS 1.5* 2.4* 3.2 3.1  --  3.1   Liver Function Tests:  Recent Labs Lab 05/05/16 0030 05/07/16 0434 05/08/16 0416 05/09/16 0404 05/10/16 0430 05/11/16 0520  AST 38  --   --   --   --   --   ALT 27  --   --   --   --   --   ALKPHOS 77  --   --   --   --   --   BILITOT 2.3*  --   --   --   --   --   PROT 8.0  --   --   --   --   --   ALBUMIN 5.0 3.2* 3.4* 3.1* 3.1* 3.1*   No results for input(s): LIPASE, AMYLASE in the last 168 hours. No results for input(s): AMMONIA in the last 168 hours. CBC:  Recent Labs Lab 05/05/16 0030  05/07/16 0434 05/08/16 0436 05/09/16 0404 05/10/16 0430 05/11/16 0520  WBC 29.1*  < > 5.5 4.7 4.0 4.0 4.0  NEUTROABS 27.0*  --   --   --   --   --   --   HGB 15.3  < > 11.8* 12.8* 12.2* 12.1* 11.8*  HCT 45.5  < > 35.8* 39.4 38.1* 37.4* 37.8*  MCV 88.5  < > 87.1 88.9 90.1 89.7 90.9  PLT 285  < > 142* 137* 130* 130* 128*  < > = values in this interval not displayed. Cardiac Enzymes:  Recent Labs Lab 05/07/16 0434  CKTOTAL 202   BNP: BNP (last 3 results) No results for input(s): BNP in the last 8760 hours.  ProBNP (last 3 results) No results for input(s): PROBNP in the last 8760 hours.  CBG:  Recent Labs Lab  05/05/16 0304  GLUCAP 202*   Signed:  Velvet Bathe MD.  [facility] 05/11/2016, 1:08 PM

## 2016-05-11 NOTE — Progress Notes (Signed)
Occupational Therapy Treatment Patient Details Name: Lecil Tapp MRN: [id number] DOB: 1996-01-30 Today's Date: 05/11/2016    History of present illness Cyrus Ramsburg is a 20 y.o. male with significant past medical history for Duchenne muscular dystrophy and wheelchair-bound, presents to [facility] ER complaining of sudden onset and gradual worsening nausea, sore throat, rhinorrhea, as well as mild diarrhea. Blood culture grew gram + rods in anaerobic bottle. Tachycardic. Concern for sepsis.    OT comments  Pt demonstrates improved finger flexion Rt hand actively today.  Definitely feel he would benefit from splinting to correct deformities.  Recommend OPOT - CM and MD notified.   Follow Up Recommendations  Outpatient OT;Supervision/Assistance - 24 hour    Equipment Recommendations       Recommendations for Other Services      Precautions / Restrictions Precautions Precautions: Fall;Other (comment)       Mobility Bed Mobility                  Transfers                      Balance                                   ADL                                         General ADL Comments: Pt sitting up in wheelchair.  Father reports he fed pt, and they have not attempted to use built up handle for utensil - encouraged them to attempt use.        Vision                     Perception     Praxis      Cognition   Behavior During Therapy: [facility] for tasks assessed/performed Overall Cognitive Status: Within Functional Limits for tasks assessed                       Extremity/Trunk Assessment               Exercises Other Exercises Other Exercises: Pt report Rt ring finger is moving better after exercises performed yesterday - he is now able to flex at PIP joint actively. Soft tissue mobs and alignement of joints performed followed by passive and AA flexion of Rt digits performed.  Father provided with info re:  OPOT, vocational rehab and [facility].      Shoulder Instructions       General Comments      Pertinent Vitals/ Pain       Pain Assessment: No/denies pain  Home Living                                          Prior Functioning/Environment              Frequency Min 2X/week     Progress Toward Goals  OT Goals(current goals can now be found in the care plan section)  Progress towards OT goals: Progressing toward goals  ADL Goals Pt/caregiver will Perform Home Exercise Program: Increased ROM;Both right and left upper extremity;With written HEP provided;With Supervision Additional  ADL Goal #1: Family will demonstrate correct positioning of B feet in prevalon boots to decrease further pressureof B ankles Additional ADL Goal #2: Pt will dmeonstarte ability to feed sefl with use of AE with mod A  Plan Discharge plan remains appropriate    Co-evaluation                 End of Session     Activity Tolerance Patient tolerated treatment well   Patient Left in chair;with call bell/phone within reach;with family/visitor present   Nurse Communication          Time: 7322-5672 OT Time Calculation (min): 12 min  Charges: OT General Charges $OT Visit: 1 Procedure OT Treatments $Therapeutic Exercise: 8-22 mins  Jerrik Housholder M 05/11/2016, 2:48 PM

## 2016-05-11 NOTE — Progress Notes (Signed)
Nsg Discharge Note  Admit Date:  05/04/2016 Discharge date: 05/11/2016   [name] to be D/C'd Home per MD order.  AVS completed.  Copy for chart, and copy for patient signed, and dated. Patient/caregiver able to verbalize understanding.  Discharge Medication:   Medication List    TAKE these medications   clotrimazole 10 MG troche Commonly known as:  MYCELEX Take 1 tablet (10 mg total) by mouth 5 (five) times daily.   CoQ10 100 MG Caps Take 100 mg by mouth daily after breakfast.   montelukast 10 MG tablet Commonly known as:  SINGULAIR Take 10 mg by mouth at bedtime.   ofloxacin 0.3 % ophthalmic solution Commonly known as:  OCUFLOX Place 2 drops into both eyes 2 (two) times daily.   Selenium 100 MCG Tabs Take 100 mcg by mouth daily after breakfast.   VITAMIN D PO Take 2 tablets by mouth daily after breakfast.       Discharge Assessment: Vitals:   05/11/16 0535 05/11/16 1334  BP: 97/62 130/85  Pulse: 92 (!) 101  Resp: 18 20  Temp: 97.6 F (36.4 C) 98.1 F (36.7 C)   Skin clean, dry and intact without evidence of skin break down, no evidence of skin tears noted. IV catheter discontinued intact. Site without signs and symptoms of complications - no redness or edema noted at insertion site, patient denies c/o pain - only slight tenderness at site.  Dressing with slight pressure applied.  D/c Instructions-Education: Discharge instructions given to patient/family with verbalized understanding. D/c education completed with patient/family including follow up instructions, medication list, d/c activities limitations if indicated, with other d/c instructions as indicated by MD - patient able to verbalize understanding, all questions fully answered. Patient instructed to return to ED, call 911, or call MD for any changes in condition.  Patient escorted via [facility] ( his dad escorted him via his electric WC), and D/C home via private auto.  Dayle Points, RN 05/11/2016 2:55 PM

## 2016-06-03 ENCOUNTER — Other Ambulatory Visit: Payer: Self-pay | Admitting: Internal Medicine

## 2017-09-23 ENCOUNTER — Other Ambulatory Visit: Payer: Self-pay

## 2017-09-23 ENCOUNTER — Emergency Department (HOSPITAL_BASED_OUTPATIENT_CLINIC_OR_DEPARTMENT_OTHER): Payer: 59

## 2017-09-23 ENCOUNTER — Observation Stay (HOSPITAL_BASED_OUTPATIENT_CLINIC_OR_DEPARTMENT_OTHER)
Admission: EM | Admit: 2017-09-23 | Discharge: 2017-09-25 | Disposition: A | Discharge status: Home / Self Care | Payer: 59 | Attending: Family Medicine | Admitting: Family Medicine

## 2017-09-23 ENCOUNTER — Encounter (HOSPITAL_BASED_OUTPATIENT_CLINIC_OR_DEPARTMENT_OTHER): Payer: Self-pay | Admitting: *Deleted

## 2017-09-23 DIAGNOSIS — G7100 Muscular dystrophy, unspecified: Secondary | ICD-10-CM | POA: Diagnosis not present

## 2017-09-23 DIAGNOSIS — R0602 Shortness of breath: Secondary | ICD-10-CM | POA: Diagnosis present

## 2017-09-23 DIAGNOSIS — Z794 Long term (current) use of insulin: Secondary | ICD-10-CM | POA: Diagnosis not present

## 2017-09-23 DIAGNOSIS — E873 Alkalosis: Secondary | ICD-10-CM | POA: Diagnosis not present

## 2017-09-23 DIAGNOSIS — R000 Tachycardia, unspecified: Secondary | ICD-10-CM | POA: Diagnosis present

## 2017-09-23 DIAGNOSIS — Z79899 Other long term (current) drug therapy: Secondary | ICD-10-CM | POA: Diagnosis not present

## 2017-09-23 DIAGNOSIS — M419 Scoliosis, unspecified: Secondary | ICD-10-CM | POA: Insufficient documentation

## 2017-09-23 DIAGNOSIS — Q6589 Other specified congenital deformities of hip: Secondary | ICD-10-CM | POA: Diagnosis not present

## 2017-09-23 DIAGNOSIS — K567 Ileus, unspecified: Secondary | ICD-10-CM | POA: Diagnosis present

## 2017-09-23 DIAGNOSIS — E162 Hypoglycemia, unspecified: Secondary | ICD-10-CM | POA: Diagnosis present

## 2017-09-23 HISTORY — DX: Ileus, unspecified: K56.7

## 2017-09-23 HISTORY — DX: Other specified health status: Z78.9

## 2017-09-23 LAB — I-STAT CG4 LACTIC ACID, ED
Lactic Acid, Venous: 1.2 mmol/L (ref 0.5–1.9)
Lactic Acid, Venous: 1.03 mmol/L (ref 0.5–1.9)

## 2017-09-23 LAB — COMPREHENSIVE METABOLIC PANEL
ALK PHOS: 78 U/L (ref 38–126)
ALT: 48 U/L (ref 17–63)
ANION GAP: 14 (ref 5–15)
AST: 47 U/L — AB (ref 15–41)
Albumin: 5.2 g/dL — ABNORMAL HIGH (ref 3.5–5.0)
BILIRUBIN TOTAL: 1.4 mg/dL — AB (ref 0.3–1.2)
BUN: 10 mg/dL (ref 6–20)
CALCIUM: 9.8 mg/dL (ref 8.9–10.3)
CO2: 23 mmol/L (ref 22–32)
Chloride: 103 mmol/L (ref 101–111)
Creatinine, Ser: <0.3 mg/dL — ABNORMAL LOW (ref 0.61–1.24)
Glucose, Bld: 63 mg/dL — ABNORMAL LOW (ref 65–99)
POTASSIUM: 3.9 mmol/L (ref 3.5–5.1)
Sodium: 140 mmol/L (ref 135–145)
TOTAL PROTEIN: 8.2 g/dL — AB (ref 6.5–8.1)

## 2017-09-23 LAB — CBC WITH DIFFERENTIAL/PLATELET
Basophils Absolute: 0 10*3/uL (ref 0.0–0.1)
Basophils Relative: 0 %
Eosinophils Absolute: 0.1 10*3/uL (ref 0.0–0.7)
Eosinophils Relative: 1 %
HEMATOCRIT: 49.5 % (ref 39.0–52.0)
Hemoglobin: 16.3 g/dL (ref 13.0–17.0)
LYMPHS PCT: 31 %
Lymphs Abs: 2.6 10*3/uL (ref 0.7–4.0)
MCH: 29.5 pg (ref 26.0–34.0)
MCHC: 32.9 g/dL (ref 30.0–36.0)
MCV: 89.5 fL (ref 78.0–100.0)
MONO ABS: 0.6 10*3/uL (ref 0.1–1.0)
MONOS PCT: 8 %
NEUTROS ABS: 5 10*3/uL (ref 1.7–7.7)
Neutrophils Relative %: 60 %
Platelets: 223 10*3/uL (ref 150–400)
RBC: 5.53 MIL/uL (ref 4.22–5.81)
RDW: 14.3 % (ref 11.5–15.5)
WBC: 8.3 10*3/uL (ref 4.0–10.5)

## 2017-09-23 LAB — PROTIME-INR
INR: 1.02
Prothrombin Time: 13.3 seconds (ref 11.4–15.2)

## 2017-09-23 LAB — MAGNESIUM: MAGNESIUM: 1.8 mg/dL (ref 1.7–2.4)

## 2017-09-23 LAB — CBG MONITORING, ED: GLUCOSE-CAPILLARY: 47 mg/dL — AB (ref 65–99)

## 2017-09-23 LAB — LIPASE, BLOOD: LIPASE: 18 U/L (ref 11–51)

## 2017-09-23 LAB — D-DIMER, QUANTITATIVE: D-Dimer, Quant: <0.27 ug/mL-FEU (ref 0.00–0.50)

## 2017-09-23 LAB — TROPONIN I: Troponin I: <0.03 ng/mL (ref <0.03)

## 2017-09-23 MED ORDER — SODIUM CHLORIDE 0.9 % IV BOLUS (SEPSIS)
1000.0000 mL | Freq: Once | INTRAVENOUS | Status: AC
  Administered 2017-09-23: 1000 mL via INTRAVENOUS

## 2017-09-23 MED ORDER — DEXTROSE 50 % IV SOLN
50.0000 mL | Freq: Once | INTRAVENOUS | Status: AC
  Administered 2017-09-23: 50 mL via INTRAVENOUS

## 2017-09-23 MED ORDER — IOPAMIDOL (ISOVUE-300) INJECTION 61%
100.0000 mL | Freq: Once | INTRAVENOUS | Status: AC | PRN
  Administered 2017-09-23: 100 mL via INTRAVENOUS

## 2017-09-23 MED ORDER — DEXTROSE 50 % IV SOLN
INTRAVENOUS | Status: AC
  Administered 2017-09-23: 50 mL via INTRAVENOUS
  Filled 2017-09-23: qty 50

## 2017-09-23 NOTE — ED Triage Notes (Signed)
Sob since this am. He is flushed. Hx of MD.

## 2017-09-23 NOTE — ED Notes (Signed)
ART stick done for labs per MD

## 2017-09-23 NOTE — ED Notes (Signed)
Report given to Diane, RN.

## 2017-09-23 NOTE — ED Notes (Signed)
Pt given PO fluids at this time due to glucose: 63 in metabolic panel. Family advised that pt is able to eat.

## 2017-09-23 NOTE — ED Provider Notes (Signed)
  Physical Exam  BP 122/73   Pulse (!) 130   Temp 99.3 F (37.4 C) (Rectal)   Resp 20   Wt 47.6 kg (105 lb)   SpO2 98%   BMI 18.02 kg/m   Physical Exam  ED Course/Procedures   Clinical Course as of Sep 23 1900  Mon Sep 23, 2017  1525 CO2: 23 [CT]    Clinical Course User Index [CT] Tegeler, Gwenyth Allegra, MD    Procedures  MDM  Patient care assumed at 4 pm. Patient here with SOB, tachycardia, abdominal distention. Has previous ileus requiring hospitalization. Patient had negative d-dimer, unremarkable CXR. Sign out pending CT and admission.   7:03 PM Lactate remained nl. WBC nl. CT showed ileus. Given 2 L NS bolus, still tachy around 130s. CT showed ileus. Electrolytes unremarkable. Will admit for persistent tachycardia, dehydration secondary to ileus.     Drenda Freeze, MD 09/23/17 [phone]

## 2017-09-23 NOTE — ED Provider Notes (Signed)
[facility] EMERGENCY DEPARTMENT Provider Note   CSN: [id number] Arrival date & time: 09/23/17  1259     History   Chief Complaint Chief Complaint  Patient presents with  . Shortness of Breath    HPI Peter Hayes is a 21 y.o. male.  The history is provided by the patient, medical records and a parent. No language interpreter was used.  Shortness of Breath  This is a new problem. The average episode lasts 1 day. The problem occurs continuously.The current episode started 12 to 24 hours ago. The problem has been gradually improving. Associated symptoms include abdominal pain. Pertinent negatives include no fever, no headaches, no rhinorrhea, no neck pain, no cough, no sputum production, no wheezing, no chest pain, no vomiting, no rash, no leg pain and no leg swelling. He has tried nothing for the symptoms. The treatment provided no relief.  Abdominal Pain   This is a new problem. The current episode started 12 to 24 hours ago. The problem occurs constantly. The problem has been resolved. The pain is located in the generalized abdominal region. The pain is mild. Associated symptoms include diarrhea and constipation. Pertinent negatives include fever, nausea, vomiting, dysuria, frequency and headaches. Nothing aggravates the symptoms. Nothing relieves the symptoms.    Past Medical History:  Diagnosis Date  . MD (muscular dystrophy)     Patient Active Problem List   Diagnosis Date Noted  . Sepsis (HCC) 05/05/2016  . Muscular dystrophy 05/05/2016  . Moderate protein-calorie malnutrition (HCC) 05/05/2016  . Tachycardia 05/05/2016  . Leukocytosis 05/05/2016  . High anion gap metabolic acidosis 05/05/2016  . Respiratory alkalosis 05/05/2016    History reviewed. No pertinent surgical history.     Home Medications    Prior to Admission medications   Medication Sig Start Date End Date Taking? Authorizing Provider  Cholecalciferol (VITAMIN D PO) Take 2 tablets by mouth  daily after breakfast.    [provider]  clotrimazole (MYCELEX) 10 MG troche Take 1 tablet (10 mg total) by mouth 5 (five) times daily. 05/11/16   Penny Pia, MD  Coenzyme Q10 (COQ10) 100 MG CAPS Take 100 mg by mouth daily after breakfast.    [provider]  montelukast (SINGULAIR) 10 MG tablet Take 10 mg by mouth at bedtime. 04/24/16   [provider]  ofloxacin (OCUFLOX) 0.3 % ophthalmic solution Place 2 drops into both eyes 2 (two) times daily.  04/24/16   [provider]  Selenium 100 MCG TABS Take 100 mcg by mouth daily after breakfast.    [provider]    Family History No family history on file.  Social History Social History   Tobacco Use  . Smoking status: Never Smoker  . Smokeless tobacco: Never Used  Substance Use Topics  . Alcohol use: No  . Drug use: No     Allergies   Patient has no known allergies.   Review of Systems Review of Systems  Constitutional: Negative for chills, diaphoresis, fatigue and fever.  HENT: Negative for congestion and rhinorrhea.   Respiratory: Positive for shortness of breath. Negative for cough, sputum production, chest tightness and wheezing.   Cardiovascular: Negative for chest pain, palpitations and leg swelling.  Gastrointestinal: Positive for abdominal pain, constipation and diarrhea. Negative for nausea and vomiting.  Genitourinary: Negative for dysuria and frequency.  Musculoskeletal: Negative for back pain, neck pain and neck stiffness.  Skin: Negative for rash and wound.  Neurological: Negative for light-headedness and headaches.  Psychiatric/Behavioral: Negative for  agitation and confusion.  All other systems reviewed and are negative.    Physical Exam Updated Vital Signs BP 140/87 (BP Location: Left Arm)   Pulse (!) 130 Comment: Triage RN notified   Temp 98.5 F (36.9 C) (Oral)   Resp 20   Wt 47.6 kg (105 lb)   SpO2 95%   BMI 18.02 kg/m   Physical Exam    Constitutional: He is oriented to person, place, and time.  Non-toxic appearance. He does not appear ill. No distress.  HENT:  Head: Normocephalic and atraumatic.  Mouth/Throat: Oropharynx is clear and moist. No oropharyngeal exudate.  Eyes: Conjunctivae and EOM are normal. Pupils are equal, round, and reactive to light.  Neck: Normal range of motion. No JVD present.  Cardiovascular: Intact distal pulses. Tachycardia present.  No murmur heard. Pulmonary/Chest: Effort normal. No stridor. No respiratory distress. He has no wheezes. He has no rales. He exhibits no tenderness.  Abdominal: Soft. Bowel sounds are normal. He exhibits no distension (cachectic). There is no tenderness.  Musculoskeletal: He exhibits no tenderness.  Neurological: He is alert and oriented to person, place, and time. No sensory deficit. He exhibits abnormal muscle tone.  Skin: Capillary refill takes less than 2 seconds. He is not diaphoretic. No erythema. There is pallor.  Psychiatric: He has a normal mood and affect.  Nursing note and vitals reviewed.    ED Treatments / Results  Labs (all labs ordered are listed, but only abnormal results are displayed) Labs Reviewed  COMPREHENSIVE METABOLIC PANEL - Abnormal; Notable for the following components:      Result Value   Glucose, Bld 63 (*)    Creatinine, Ser <0.30 (*)    Total Protein 8.2 (*)    Albumin 5.2 (*)    AST 47 (*)    Total Bilirubin 1.4 (*)    All other components within normal limits  URINE CULTURE  CULTURE, BLOOD (ROUTINE X 2)  CULTURE, BLOOD (ROUTINE X 2)  CBC WITH DIFFERENTIAL/PLATELET  LIPASE, BLOOD  PROTIME-INR  D-DIMER, QUANTITATIVE (NOT AT [facility])  TROPONIN I  URINALYSIS, ROUTINE W REFLEX MICROSCOPIC  I-STAT CG4 LACTIC ACID, ED  I-STAT CG4 LACTIC ACID, ED    EKG  EKG Interpretation  Date/Time:  Monday September 23 2017 13:29:11 EST Ventricular Rate:  134 PR Interval:    QRS Duration: 91 QT Interval:  300 QTC Calculation: 448 R  Axis:   97 Text Interpretation:  Sinus tachycardia Borderline right axis deviation Borderline repolarization abnormality When compared to prior, faster rate. No STEMI Confirmed by Antony Blackbird [phone]) on 09/23/2017 1:40:51 PM       Radiology Dg Chest Port 1 View  Result Date: 09/23/2017 CLINICAL DATA:  21 year old male with shortness breath since this morning. Muscular dystrophy. Initial encounter. EXAM: PORTABLE CHEST 1 VIEW COMPARISON:  None. FINDINGS: Scoliosis with slightly rotated exam. No infiltrate, congestive heart failure or pneumothorax. Heart size within normal limits. Gas-filled prominent size colon IMPRESSION: No infiltrate or congestive heart failure. Scoliosis. Gas filled prominence size colon. Electronically Signed   By: Genia Del M.D.   On: 09/23/2017 14:52    Procedures Procedures (including critical care time)  Medications Ordered in ED Medications  sodium chloride 0.9 % bolus 1,000 mL (0 mLs Intravenous Stopped 09/23/17 1455)  sodium chloride 0.9 % bolus 1,000 mL (1,000 mLs Intravenous New Bag/Given 09/23/17 1456)     Initial Impression / Assessment and Plan / ED Course  I have reviewed the triage vital signs and the nursing  notes.  Pertinent labs & imaging results that were available during my care of the patient were reviewed by me and considered in my medical decision making (see chart for details).  Clinical Course as of Sep 23 1528  Mon Sep 23, 2017  1525 CO2: 23 [CT]    Clinical Course User Index [CT] Marylan Glore, Gwenyth Allegra, MD    Levert Heslop is a 21 y.o. male with a past medical history significant for muscular dystrophy who presents with tachycardia, shortness of breath, dry mouth, recent diarrhea and some abdominal cramping during a bowel movement.  Patient reports that he was doing well for the last several days aside from some diarrhea.  Patient reports that he woke up this morning and was feeling short of breath.  He denies chest pain,  palpitations, or lightheadedness but does feel slightly fatigued.  He reports that he went to have a bowel movement today and had some abdominal cramping.  It was mild to moderate.  He denied nausea, vomiting, urinary symptoms.  He reported that he has been passing gas but did not have a bowel movement earlier.  He denies significant cough, fevers, or chills.  He does report that he was short of breath and continues to be somewhat short of breath.  The patient denies any other complaints on arrival.  On arrival, patient's heart rate is in the 130s.  It appears sinus tachycardia on EKG.  Patient's temperature was 99.3.  On exam, patient was found to be very cachectic and thin.  Patient's abdomen was nontender with deep palpation utilized.  No CVA tenderness.  Lungs were clear.  Patient has decreased muscle tissue in all parts of his body but has normal sensation throughout.  Patient has some contractures.  Patient will have workup to look for infection being the etiology of his tachycardia and symptoms.  Also workup for dehydration in the setting of his recent diarrhea.  Fluids will be given.  With his shortness of breath and tachycardia, a d-dimer will be added.  Next  Anticipate reassessment after workup.  3:26 PM Initial diagnostic workup results are seen above.  Lactic acid not elevated.  Troponin negative.  CBC shows no leukocytosis or anemia.  Normal platelets.  Metabolic panel showed mild elevation of AST and bilirubin but otherwise reassuring.  Lipase not elevated.  D-dimer negative, doubt PE.  Chest x-ray shows no infiltrate or CHF but does show a gas-filled prominence of the colon.  Given the patient's start of his symptoms with abdominal pain and related to a bowel movement as well as the findings of gas-filled colon, CT of the abdomen and pelvis will be ordered.  More fluids ordered.  After 1 L of fluids, heart rate has continued to increase.  Heart rate is now in the 140s-150s persistently.   Patient denies any shortness of breath or chest pain.  He denies any abdominal pain at this time.  Patient's abdomen was repalpated with no tenderness.   Anticipate admission for persistent tachycardia of unclear etiology despite fluid resuscitation.  Care transferred to Dr. Darl Householder while awaiting for CT.  Care transferred in stable condition while awaiting reassessment.  Final Clinical Impressions(s) / ED Diagnoses   Final diagnoses:  SOB (shortness of breath)   Clinical Impression: 1. SOB (shortness of breath)     Disposition: Care transferred to Dr. Darl Householder while awaiting CT results and reevaluation.  Anticipate admission for tachycardia.    Kymberly Blomberg, Gwenyth Allegra, MD 09/23/17 [phone]

## 2017-09-23 NOTE — ED Notes (Signed)
Spoke with [facility] @ Bed Control and they are working on a bed for this patient.

## 2017-09-24 ENCOUNTER — Encounter (HOSPITAL_COMMUNITY): Payer: Self-pay | Admitting: Family Medicine

## 2017-09-24 ENCOUNTER — Other Ambulatory Visit: Payer: Self-pay

## 2017-09-24 DIAGNOSIS — K567 Ileus, unspecified: Secondary | ICD-10-CM

## 2017-09-24 DIAGNOSIS — G7100 Muscular dystrophy, unspecified: Secondary | ICD-10-CM

## 2017-09-24 DIAGNOSIS — E162 Hypoglycemia, unspecified: Secondary | ICD-10-CM

## 2017-09-24 DIAGNOSIS — R000 Tachycardia, unspecified: Secondary | ICD-10-CM

## 2017-09-24 LAB — COMPREHENSIVE METABOLIC PANEL
ALT: 50 U/L (ref 17–63)
ANION GAP: 11 (ref 5–15)
AST: 73 U/L — ABNORMAL HIGH (ref 15–41)
Albumin: 3.5 g/dL (ref 3.5–5.0)
Alkaline Phosphatase: 59 U/L (ref 38–126)
BILIRUBIN TOTAL: 1.8 mg/dL — AB (ref 0.3–1.2)
CO2: 20 mmol/L — ABNORMAL LOW (ref 22–32)
Calcium: 8.4 mg/dL — ABNORMAL LOW (ref 8.9–10.3)
Chloride: 105 mmol/L (ref 101–111)
Creatinine, Ser: <0.3 mg/dL — ABNORMAL LOW (ref 0.61–1.24)
Glucose, Bld: 89 mg/dL (ref 65–99)
POTASSIUM: 3.1 mmol/L — AB (ref 3.5–5.1)
Sodium: 136 mmol/L (ref 135–145)
TOTAL PROTEIN: 5.6 g/dL — AB (ref 6.5–8.1)

## 2017-09-24 LAB — GLUCOSE, CAPILLARY
GLUCOSE-CAPILLARY: 85 mg/dL (ref 65–99)
GLUCOSE-CAPILLARY: 93 mg/dL (ref 65–99)
Glucose-Capillary: 128 mg/dL — ABNORMAL HIGH (ref 65–99)
Glucose-Capillary: 74 mg/dL (ref 65–99)
Glucose-Capillary: 116 mg/dL — ABNORMAL HIGH (ref 65–99)
Glucose-Capillary: 97 mg/dL (ref 65–99)
Glucose-Capillary: 109 mg/dL — ABNORMAL HIGH (ref 65–99)

## 2017-09-24 LAB — MAGNESIUM: MAGNESIUM: 1.5 mg/dL — AB (ref 1.7–2.4)

## 2017-09-24 LAB — BETA-HYDROXYBUTYRIC ACID: Beta-Hydroxybutyric Acid: 2.83 mmol/L — ABNORMAL HIGH (ref 0.05–0.27)

## 2017-09-24 LAB — CORTISOL: Cortisol, Plasma: 5.9 ug/dL

## 2017-09-24 LAB — HIV ANTIBODY (ROUTINE TESTING W REFLEX): HIV Screen 4th Generation wRfx: NONREACTIVE

## 2017-09-24 MED ORDER — COQ10 100 MG PO CAPS: 100.0000 mg | ORAL_CAPSULE | Freq: Every day | ORAL | Status: DC

## 2017-09-24 MED ORDER — ACETAMINOPHEN 325 MG PO TABS: 650.0000 mg | ORAL_TABLET | Freq: Four times a day (QID) | ORAL | Status: DC | PRN

## 2017-09-24 MED ORDER — MAGNESIUM SULFATE 2 GM/50ML IV SOLN
2.0000 g | Freq: Once | INTRAVENOUS | Status: AC
  Administered 2017-09-24: 2 g via INTRAVENOUS
  Filled 2017-09-24: qty 50

## 2017-09-24 MED ORDER — ONDANSETRON HCL 4 MG/2ML IJ SOLN: 4.0000 mg | Freq: Four times a day (QID) | INTRAMUSCULAR | Status: DC | PRN

## 2017-09-24 MED ORDER — CLOTRIMAZOLE 10 MG MT TROC: 10.0000 mg | Freq: Every day | OROMUCOSAL | Status: DC

## 2017-09-24 MED ORDER — ACETAMINOPHEN 650 MG RE SUPP: 650.0000 mg | Freq: Four times a day (QID) | RECTAL | Status: DC | PRN

## 2017-09-24 MED ORDER — KETOROLAC TROMETHAMINE 30 MG/ML IJ SOLN: 30.0000 mg | Freq: Four times a day (QID) | INTRAMUSCULAR | Status: DC | PRN

## 2017-09-24 MED ORDER — SELENIUM 50 MCG PO TABS
100.0000 ug | ORAL_TABLET | Freq: Every day | ORAL | Status: DC
  Administered 2017-09-24 – 2017-09-25 (×2): 100 ug via ORAL
  Filled 2017-09-24 (×3): qty 2

## 2017-09-24 MED ORDER — ONDANSETRON HCL 4 MG PO TABS: 4.0000 mg | ORAL_TABLET | Freq: Four times a day (QID) | ORAL | Status: DC | PRN

## 2017-09-24 MED ORDER — KCL IN DEXTROSE-NACL 10-5-0.45 MEQ/L-%-% IV SOLN
INTRAVENOUS | Status: AC
  Administered 2017-09-24: 06:00:00 via INTRAVENOUS
  Filled 2017-09-24: qty 1000

## 2017-09-24 MED ORDER — ENOXAPARIN SODIUM 40 MG/0.4ML ~~LOC~~ SOLN
40.0000 mg | SUBCUTANEOUS | Status: DC
  Administered 2017-09-24 – 2017-09-25 (×2): 40 mg via SUBCUTANEOUS
  Filled 2017-09-24 (×2): qty 0.4

## 2017-09-24 MED ORDER — OFLOXACIN 0.3 % OP SOLN: 2.0000 [drp] | Freq: Two times a day (BID) | OPHTHALMIC | Status: DC

## 2017-09-24 MED ORDER — HYDROCODONE-ACETAMINOPHEN 5-325 MG PO TABS: 1.0000 | ORAL_TABLET | ORAL | Status: DC | PRN

## 2017-09-24 MED ORDER — KCL IN DEXTROSE-NACL 10-5-0.45 MEQ/L-%-% IV SOLN
INTRAVENOUS | Status: DC
  Administered 2017-09-24: 18:00:00 via INTRAVENOUS
  Filled 2017-09-24 (×2): qty 1000

## 2017-09-24 MED ORDER — MONTELUKAST SODIUM 10 MG PO TABS
10.0000 mg | ORAL_TABLET | Freq: Every day | ORAL | Status: DC
  Administered 2017-09-24: 10 mg via ORAL
  Filled 2017-09-24: qty 1

## 2017-09-24 NOTE — Plan of Care (Signed)
  Elimination: Will not experience complications related to bowel motility Patient had soft/loose stool not watery per father. Not visualized by staff. Patient denies any abdominal discomfort.  09/24/2017 2344 - Progressing by Verne Grain, RN  Transferred from wheelchair to bed by father.

## 2017-09-24 NOTE — H&P (Signed)
History and Physical    [name]:[id number] DOB: 24-Mar-1996 DOA: 09/23/2017  PCP: System, Pcp Not In   Patient coming from: Home, by way of [facility]  Chief Complaint: Abdominal discomfort   HPI: Peter Hayes is a 21 y.o. male with medical history significant for muscular dystrophy and chronic sinus tachycardia, now presenting to the emergency department for evaluation of abdominal discomfort.  Patient had reportedly been experiencing some loose stools couple days ago and then crampy discomfort in the abdomen today. Feels constipated but denies abdominal pain. There was an episode of nausea without vomiting associated with this. Reports similar symptoms frequently, usually resolving on their own within a couple days.   ED Course: Upon arrival to the ED, patient is found to be afebrile, saturating well on room air, tachycardic in the 130s, and with stable blood pressure.  EKG features a sinus tachycardia with rate 134.  Chemistry panel is notable for a glucose of 63.  CBC is unremarkable, d-dimer is undetectable, troponin is undetectable, and lactic acid is reassuringly normal.  Chest x-ray is negative for acute cardiopulmonary disease notable for a gas filled colon.  CT of the abdomen and pelvis features moderately enlarged stomach without evidence for SBO possibly reflecting ileus.  Blood and urine cultures were collected in the ED, patient was treated with dextrose and 3 L of normal saline.  He remained tachycardic, but with stable blood pressure and no apparent respiratory distress.  He has been transferred to [facility] for admission to the medical-surgical unit for ongoing evaluation and management of ileus.  Review of Systems:  All other systems reviewed and apart from HPI, are negative.  Past Medical History:  Diagnosis Date  . MD (muscular dystrophy)     History reviewed. No pertinent surgical history.   reports that  has never smoked. he has never used smokeless tobacco. He  reports that he does not drink alcohol or use drugs.  No Known Allergies  History reviewed. No pertinent family history.   Prior to Admission medications   Medication Sig Start Date End Date Taking? Authorizing Provider  Cholecalciferol (VITAMIN D PO) Take 2 tablets by mouth daily after breakfast.    [provider]  clotrimazole (MYCELEX) 10 MG troche Take 1 tablet (10 mg total) by mouth 5 (five) times daily. 05/11/16   Velvet Bathe, MD  Coenzyme Q10 (COQ10) 100 MG CAPS Take 100 mg by mouth daily after breakfast.    [provider]  montelukast (SINGULAIR) 10 MG tablet Take 10 mg by mouth at bedtime. 04/24/16   [provider]  ofloxacin (OCUFLOX) 0.3 % ophthalmic solution Place 2 drops into both eyes 2 (two) times daily.  04/24/16   [provider]  Selenium 100 MCG TABS Take 100 mcg by mouth daily after breakfast.    [provider]    Physical Exam: Vitals:   09/23/17 2315 09/23/17 2330 09/23/17 2340 09/24/17 0134  BP: (!) 107/56 (!) 102/49  (!) 113/58  Pulse: (!) 146 (!) 142  (!) 124  Resp: 20 (!) 22  18  Temp:   99.4 F (37.4 C) 98.3 F (36.8 C)  TempSrc:   Oral Oral  SpO2: 95% 96%  97%  Weight:          Constitutional: NAD, calm, comfortable Eyes: PERTLA, lids and conjunctivae normal ENMT: Mucous membranes are moist. Posterior pharynx clear of any exudate or lesions.   Neck: normal, supple, no masses, no thyromegaly Respiratory: clear to auscultation bilaterally,  no wheezing, no crackles. Normal respiratory effort.   Cardiovascular: Rate ~120 and regular. No extremity edema. No significant JVD. Abdomen: No distension, no tenderness, no masses palpated. Bowel sounds normal.  Musculoskeletal: no clubbing / cyanosis. No joint deformity upper and lower extremities. Generalized muscle atrophy.  Skin: no significant rashes, lesions, ulcers. Warm, dry, well-perfused. Neurologic: CN 2-12 grossly intact. Sensation intact. Moving all  extremities.  Psychiatric: Alert and oriented x 3. Calm, cooperative.     Labs on Admission: I have personally reviewed following labs and imaging studies  CBC: Recent Labs  Lab 09/23/17 1337  WBC 8.3  NEUTROABS 5.0  HGB 16.3  HCT 49.5  MCV 89.5  PLT 998   Basic Metabolic Panel: Recent Labs  Lab 09/23/17 1337 09/23/17 1600  NA 140  --   K 3.9  --   CL 103  --   CO2 23  --   GLUCOSE 63*  --   BUN 10  --   CREATININE <0.30*  --   CALCIUM 9.8  --   MG  --  1.8   GFR: CrCl cannot be calculated (This lab value cannot be used to calculate CrCl because it is not a number: <0.30). Liver Function Tests: Recent Labs  Lab 09/23/17 1337  AST 47*  ALT 48  ALKPHOS 78  BILITOT 1.4*  PROT 8.2*  ALBUMIN 5.2*   Recent Labs  Lab 09/23/17 1337  LIPASE 18   No results for input(s): AMMONIA in the last 168 hours. Coagulation Profile: Recent Labs  Lab 09/23/17 1337  INR 1.02   Cardiac Enzymes: Recent Labs  Lab 09/23/17 1338  TROPONINI <0.03   BNP (last 3 results) No results for input(s): PROBNP in the last 8760 hours. HbA1C: No results for input(s): HGBA1C in the last 72 hours. CBG: Recent Labs  Lab 09/23/17 2332 09/24/17 0130  GLUCAP 47* 128*   Lipid Profile: No results for input(s): CHOL, HDL, LDLCALC, TRIG, CHOLHDL, LDLDIRECT in the last 72 hours. Thyroid Function Tests: No results for input(s): TSH, T4TOTAL, FREET4, T3FREE, THYROIDAB in the last 72 hours. Anemia Panel: No results for input(s): VITAMINB12, FOLATE, FERRITIN, TIBC, IRON, RETICCTPCT in the last 72 hours. Urine analysis:    Component Value Date/Time   COLORURINE YELLOW 05/05/2016 0236   APPEARANCEUR CLEAR 05/05/2016 0236   LABSPEC 1.019 05/05/2016 0236   PHURINE 5.5 05/05/2016 0236   GLUCOSEU 500 (A) 05/05/2016 0236   HGBUR NEGATIVE 05/05/2016 0236   BILIRUBINUR SMALL (A) 05/05/2016 0236   KETONESUR >80 (A) 05/05/2016 0236   PROTEINUR NEGATIVE 05/05/2016 0236   NITRITE NEGATIVE  05/05/2016 0236   LEUKOCYTESUR NEGATIVE 05/05/2016 0236   Sepsis Labs: $[email or web address]$ (procalcitonin:4,lacticidven:4) )No results found for this or any previous visit (from the past 240 hour(s)).   Radiological Exams on Admission: Ct Abdomen Pelvis W Contrast  Result Date: 09/23/2017 CLINICAL DATA:  Diarrhea and shortness of breath, history of muscular dystrophy EXAM: CT ABDOMEN AND PELVIS WITH CONTRAST TECHNIQUE: Multidetector CT imaging of the abdomen and pelvis was performed using the standard protocol following bolus administration of intravenous contrast. CONTRAST:  127mL ISOVUE-300 IOPAMIDOL (ISOVUE-300) INJECTION 61% COMPARISON:  05/05/2016, radiograph 09/23/2017 FINDINGS: Lower chest: Lung bases demonstrate no acute consolidation or effusion. Normal heart size. Hepatobiliary: No focal liver abnormality is seen. No gallstones, gallbladder wall thickening, or biliary dilatation. Pancreas: Unremarkable. No pancreatic ductal dilatation or surrounding inflammatory changes. Spleen: Normal in size without focal abnormality. Adrenals/Urinary Tract: Adrenal glands are unremarkable. Kidneys are normal, without renal calculi, focal  lesion, or hydronephrosis. Bladder is unremarkable. Stomach/Bowel: Slightly enlarged fluid-filled stomach. Normal passage of contrast into the duodenum and proximal small bowel. No bowel wall thickening. Gaseous dilatation of large bowel with tortuous sigmoid colon in the right upper abdomen, similar configuration compared to 2017 prior. Vascular/Lymphatic: No significant vascular findings are present. No enlarged abdominal or pelvic lymph nodes. Reproductive: Prostate is unremarkable. Other: Negative for free air or free fluid Musculoskeletal: Diffuse fatty replacement of paraspinal and pelvic musculature. Scoliosis of the spine. 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 No acute or suspicious lesion IMPRESSION: 1. Moderate enlargement of the stomach but without convincing evidence for a  small bowel obstruction. Prominent gas-filled colon in the right upper quadrant but without colon wall thickening or evidence for obstruction, findings could be secondary to mild ileus. 2. Other chronic changes as previously noted Electronically Signed   By: Donavan Foil M.D.   On: 09/23/2017 17:38   Dg Chest Port 1 View  Result Date: 09/23/2017 CLINICAL DATA:  21 year old male with shortness breath since this morning. Muscular dystrophy. Initial encounter. EXAM: PORTABLE CHEST 1 VIEW COMPARISON:  None. FINDINGS: Scoliosis with slightly rotated exam. No infiltrate, congestive heart failure or pneumothorax. Heart size within normal limits. Gas-filled prominent size colon IMPRESSION: No infiltrate or congestive heart failure. Scoliosis. Gas filled prominence size colon. Electronically Signed   By: Genia Del M.D.   On: 09/23/2017 14:52    EKG: Independently reviewed. Sinus tachycardia (rate 134).   Assessment/Plan  1. Ileus  - Pt presents with abdominal discomfort, found to have ileus on CT  - He was reportedly unable to tolerate PO in ED and transferred for admission  - He was given 3 liters NS in ED   - Continue IVF, monitor electrolytes, use prn antiemetics, advance diet as tolerated   2. Hypoglycemia  - Serum glucose 63, CBG 47  - No hx of DM and not on diabetic medications  - No evidence for sepsis or critical illness as etiology  - Check random cortisol, insulin, proinsulin, BHOB, c-peptide, and sulfonylureas  - Add dextrose to IVF, check CBG's, advance diet as tolerated     3. Tachycardia  - Chronic, stable, evaluated by EP previously  - Has been fluid-resuscitated, ruled-out for PE   4. Muscular dystrophy  - Continue supportive care     DVT prophylaxis: Lovenox Code Status: Full  Family Communication: Family updated at bedside Disposition Plan: Observe on med-surg Consults called: None Admission status: Observation    Vianne Bulls, MD [facility] Pager  [phone]  If 7PM-7AM, please contact night-coverage [email or web address] Password [facility]  09/24/2017, 2:37 AM

## 2017-09-24 NOTE — Progress Notes (Signed)
This is a no charge note.  Patient admitted earlier today by my partner Dr. Myna Hidalgo.  Paitent seen and examined.    Vomiting with ileus on CT.  IV fluids and ADAT.  Correct electrolytes.

## 2017-09-24 NOTE — Progress Notes (Signed)
Patient admitted to room 5w27 from outside facility. Patient admitted for Ileus. A/o x4. Room air. Placed on tele for sinus tach.monitoring BS closely due to hypoglycemia episode. Father at bedside patient assessment completed, placed foam dressing on sacrum and bil ankles due to redness noted and to prevent breakdown. Patient is contracted of hand and feet. Educated patient  family on use of call bell and oriented to room. Patient denies pain at this time. Plans to use urinal if needs to void able to communicate needs.

## 2017-09-25 DIAGNOSIS — K567 Ileus, unspecified: Secondary | ICD-10-CM | POA: Diagnosis not present

## 2017-09-25 DIAGNOSIS — E162 Hypoglycemia, unspecified: Secondary | ICD-10-CM | POA: Diagnosis not present

## 2017-09-25 DIAGNOSIS — G7100 Muscular dystrophy, unspecified: Secondary | ICD-10-CM | POA: Diagnosis not present

## 2017-09-25 DIAGNOSIS — R000 Tachycardia, unspecified: Secondary | ICD-10-CM | POA: Diagnosis not present

## 2017-09-25 LAB — CBC
HEMATOCRIT: 39.1 % (ref 39.0–52.0)
HEMATOCRIT: 42.9 % (ref 39.0–52.0)
HEMOGLOBIN: 14.7 g/dL (ref 13.0–17.0)
Hemoglobin: 12.9 g/dL — ABNORMAL LOW (ref 13.0–17.0)
MCH: 29.3 pg (ref 26.0–34.0)
MCH: 30.3 pg (ref 26.0–34.0)
MCHC: 33 g/dL (ref 30.0–36.0)
MCHC: 34.3 g/dL (ref 30.0–36.0)
MCV: 88.7 fL (ref 78.0–100.0)
MCV: 88.5 fL (ref 78.0–100.0)
PLATELETS: 146 10*3/uL — AB (ref 150–400)
Platelets: 162 10*3/uL (ref 150–400)
RBC: 4.41 MIL/uL (ref 4.22–5.81)
RBC: 4.85 MIL/uL (ref 4.22–5.81)
RDW: 13.9 % (ref 11.5–15.5)
RDW: 14.1 % (ref 11.5–15.5)
WBC: 4.5 10*3/uL (ref 4.0–10.5)
WBC: 5 10*3/uL (ref 4.0–10.5)

## 2017-09-25 LAB — URINALYSIS, ROUTINE W REFLEX MICROSCOPIC
Bilirubin Urine: NEGATIVE
Glucose, UA: NEGATIVE mg/dL
Hgb urine dipstick: NEGATIVE
KETONES UR: 5 mg/dL — AB
LEUKOCYTES UA: NEGATIVE
NITRITE: NEGATIVE
PH: 5 (ref 5.0–8.0)
Protein, ur: NEGATIVE mg/dL
SPECIFIC GRAVITY, URINE: 1.004 — AB (ref 1.005–1.030)

## 2017-09-25 LAB — BASIC METABOLIC PANEL
ANION GAP: 9 (ref 5–15)
BUN: <5 mg/dL — ABNORMAL LOW (ref 6–20)
CHLORIDE: 103 mmol/L (ref 101–111)
CO2: 25 mmol/L (ref 22–32)
Calcium: 8.3 mg/dL — ABNORMAL LOW (ref 8.9–10.3)
Creatinine, Ser: <0.3 mg/dL — ABNORMAL LOW (ref 0.61–1.24)
Glucose, Bld: 115 mg/dL — ABNORMAL HIGH (ref 65–99)
POTASSIUM: 2.8 mmol/L — AB (ref 3.5–5.1)
SODIUM: 137 mmol/L (ref 135–145)

## 2017-09-25 LAB — GLUCOSE, CAPILLARY
Glucose-Capillary: 109 mg/dL — ABNORMAL HIGH (ref 65–99)
Glucose-Capillary: 168 mg/dL — ABNORMAL HIGH (ref 65–99)

## 2017-09-25 LAB — POTASSIUM: POTASSIUM: 4 mmol/L (ref 3.5–5.1)

## 2017-09-25 LAB — C-PEPTIDE: C PEPTIDE: 0.6 ng/mL — AB (ref 1.1–4.4)

## 2017-09-25 MED ORDER — POTASSIUM CHLORIDE 20 MEQ PO PACK
20.0000 meq | PACK | Freq: Two times a day (BID) | ORAL | Status: DC
  Administered 2017-09-25: 20 meq via ORAL
  Filled 2017-09-25 (×2): qty 1

## 2017-09-25 MED ORDER — KCL IN DEXTROSE-NACL 40-5-0.9 MEQ/L-%-% IV SOLN
INTRAVENOUS | Status: DC
  Administered 2017-09-25: 11:00:00 via INTRAVENOUS
  Filled 2017-09-25: qty 1000

## 2017-09-25 NOTE — Progress Notes (Addendum)
Peter Hayes to be D/C'd Home per MD order.  Discussed with the patient and all questions fully answered.  VSS, Skin clean, dry and intact without evidence of skin break down, no evidence of skin tears noted. IV catheter discontinued intact. Site without signs and symptoms of complications. Dressing and pressure applied.  An After Visit Summary was printed and given to the patient. Patient received prescription.  D/c education completed with patient/family including follow up instructions, medication list, d/c activities limitations if indicated, with other d/c instructions as indicated by MD - patient able to verbalize understanding, all questions fully answered.   Patient instructed to return to ED, call 911, or call MD for any changes in condition.   Patient refused to be escorted and D/C'd with family home via private auto.  Peter Hayes 09/25/2017 5:41 PM

## 2017-09-25 NOTE — Discharge Summary (Signed)
Physician Discharge Summary  Peter Hayes RCV:[id number] DOB: 1996-09-18 DOA: 09/23/2017  PCP: Peter Hayes, Pcp Not In  Admit date: 09/23/2017 Discharge date: 09/25/2017  Admitted From: Home  Disposition:  Home   Recommendations for Outpatient Follow-up:  1. Follow up with PCP in 1-2 weeks 2. Please obtain BMP in one week  Home Health: No --> the patient HAS been referred to [facility] care for Personal care services Equipment/Devices: No  Discharge Condition: At baseline  CODE STATUS: FULL Diet recommendation: Regular  Brief/Interim Summary: The patient is a 21 yo M with muscular dystrophy and chronic tachycardia who presented with difficulty breathing, abdomen distension, found on CT to have an ileus.    He was treated with IV fluids.  K and Mag were replaced.  On HD2, his K was improved and he was able to eat without difficulty and his breathing was back to normal.    He was referred to [facility] for personal care services.        Discharge Diagnoses:  Principal Problem:   Ileus ([place]) Active Problems:   Muscular dystrophy   Tachycardia   Hypoglycemia without diagnosis of diabetes mellitus    Discharge Instructions  Discharge Instructions    Diet general   Complete by:  As directed    Discharge instructions   Complete by:  As directed    From Dr. Loleta Books: You were admitted for difficulty with breathing, that appeared from your CT scan to be from an "ileus".  This refers to slowing and stopping of the stomach and intestines, which can sometimes cause bloating and so much bloating of the stomach that it is hard to breathe and hard to eat.    This was treated with IV fluids and correcting your sodium, potassium and magnesium.    Follow up with your primary care doctor (call them today or tomorrow for an appointment) in one week.  Have them check your potassium level again.  You have been referred to [facility] for [facility].   Increase  activity slowly   Complete by:  As directed      Allergies as of 09/25/2017   No Known Allergies     Medication List    STOP taking these medications   ofloxacin 0.3 % ophthalmic solution Commonly known as:  OCUFLOX     TAKE these medications   clotrimazole 10 MG troche Commonly known as:  MYCELEX Take 1 tablet (10 mg total) by mouth 5 (five) times daily.   CoQ10 100 MG Caps Take 100 mg by mouth daily after breakfast.   montelukast 10 MG tablet Commonly known as:  SINGULAIR Take 10 mg by mouth at bedtime.   Selenium 100 MCG Tabs Take 100 mcg by mouth daily after breakfast.   VITAMIN D PO Take 2 tablets by mouth daily after breakfast.       No Known Allergies  Consultations:  None   Procedures/Studies: Ct Abdomen Pelvis W Contrast  Result Date: 09/23/2017 CLINICAL DATA:  Diarrhea and shortness of breath, history of muscular dystrophy EXAM: CT ABDOMEN AND PELVIS WITH CONTRAST TECHNIQUE: Multidetector CT imaging of the abdomen and pelvis was performed using the standard protocol following bolus administration of intravenous contrast. CONTRAST:  125mL ISOVUE-300 IOPAMIDOL (ISOVUE-300) INJECTION 61% COMPARISON:  05/05/2016, radiograph 09/23/2017 FINDINGS: Lower chest: Lung bases demonstrate no acute consolidation or effusion. Normal heart size. Hepatobiliary: No focal liver abnormality is seen. No gallstones, gallbladder wall thickening, or biliary dilatation. Pancreas: Unremarkable. No pancreatic ductal  dilatation or surrounding inflammatory changes. Spleen: Normal in size without focal abnormality. Adrenals/Urinary Tract: Adrenal glands are unremarkable. Kidneys are normal, without renal calculi, focal lesion, or hydronephrosis. Bladder is unremarkable. Stomach/Bowel: Slightly enlarged fluid-filled stomach. Normal passage of contrast into the duodenum and proximal small bowel. No bowel wall thickening. Gaseous dilatation of large bowel with tortuous sigmoid colon in the  right upper abdomen, similar configuration compared to 2017 prior. Vascular/Lymphatic: No significant vascular findings are present. No enlarged abdominal or pelvic lymph nodes. Reproductive: Prostate is unremarkable. Other: Negative for free air or free fluid Musculoskeletal: Diffuse fatty replacement of paraspinal and pelvic musculature. Scoliosis of the spine. 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 No acute or suspicious lesion IMPRESSION: 1. Moderate enlargement of the stomach but without convincing evidence for a small bowel obstruction. Prominent gas-filled colon in the right upper quadrant but without colon wall thickening or evidence for obstruction, findings could be secondary to mild ileus. 2. Other chronic changes as previously noted Electronically Signed   By: Donavan Foil M.D.   On: 09/23/2017 17:38   Dg Chest Port 1 View  Result Date: 09/23/2017 CLINICAL DATA:  21 year old male with shortness breath since this morning. Muscular dystrophy. Initial encounter. EXAM: PORTABLE CHEST 1 VIEW COMPARISON:  None. FINDINGS: Scoliosis with slightly rotated exam. No infiltrate, congestive heart failure or pneumothorax. Heart size within normal limits. Gas-filled prominent size colon IMPRESSION: No infiltrate or congestive heart failure. Scoliosis. Gas filled prominence size colon. Electronically Signed   By: Genia Del M.D.   On: 09/23/2017 14:52       Subjective: Feeling well.  Appetite back. Abdomen no longer swollen, tight.  Had a few soft BMs yesterday.  No new fever, chills, malaise.  Discharge Exam: Vitals:   09/25/17 0416 09/25/17 1517  BP: 115/77 121/77  Pulse: (!) 101 (!) 104  Resp: 18 16  Temp: (!) 97.5 F (36.4 C)   SpO2: 99% 100%   Vitals:   09/24/17 1548 09/24/17 2141 09/25/17 0416 09/25/17 1517  BP: 134/81 (!) 116/92 115/77 121/77  Pulse: (!) 125 (!) 117 (!) 101 (!) 104  Resp: $Remo'16 19 18 16  'eoLwf$ Temp:  97.8 F (36.6 C) (!) 97.5 F (36.4 C)   TempSrc:  Oral Oral   SpO2: 100%  100% 99% 100%  Weight:   36.4 kg (80 lb 4 oz)     General: Pt is alert, awake, not in acute distress, very thin, chronic contractures from MD Cardiovascular: RRR, S1/S2 +, no rubs, no gallops Respiratory: CTA bilaterally, no wheezing, no rhonchi Abdominal: Scaphoid, no guarding or tenderness, no tympany, bowel sounds positive Extremities: no edema, no cyanosis, diffuse loss of muscle mass, contractures    The results of significant diagnostics from this hospitalization (including imaging, microbiology, ancillary and laboratory) are listed below for reference.     Microbiology: Recent Results (from the past 240 hour(s))  Blood culture (routine x 2)     Status: None (Preliminary result)   Collection Time: 09/23/17  1:30 PM  Result Value Ref Range Status   Specimen Description BLOOD RIGHT WRIST  Final   Special Requests IN PEDIATRIC BOTTLE Blood Culture adequate volume  Final   Culture   Final    NO GROWTH 2 DAYS Performed at [facility], [address], [place], [place] [address]    Report Status PENDING  Incomplete  Blood culture (routine x 2)     Status: None (Preliminary result)   Collection Time: 09/23/17  1:50 PM  Result Value Ref Range Status   Specimen Description BLOOD LEFT WRIST  Final   Special Requests IN PEDIATRIC BOTTLE Blood Culture adequate volume  Final   Culture   Final    NO GROWTH 2 DAYS Performed at [facility], [address], [place], [place] [address]    Report Status PENDING  Incomplete     Labs: BNP (last 3 results) No results for input(s): BNP in the last 8760 hours. Basic Metabolic Panel: Recent Labs  Lab 09/23/17 1337 09/23/17 1600 09/24/17 0643 09/25/17 0527 09/25/17 1501  NA 140  --  136 137  --   K 3.9  --  3.1* 2.8* 4.0  CL 103  --  105 103  --   CO2 23  --  20* 25  --   GLUCOSE 63*  --  89 115*  --   BUN 10  --  <5* <5*  --   CREATININE <0.30*  --  <0.30* <0.30*  --   CALCIUM 9.8  --  8.4* 8.3*  --   MG  --  1.8  1.5*  --   --    Liver Function Tests: Recent Labs  Lab 09/23/17 1337 09/24/17 0643  AST 47* 73*  ALT 48 50  ALKPHOS 78 59  BILITOT 1.4* 1.8*  PROT 8.2* 5.6*  ALBUMIN 5.2* 3.5   Recent Labs  Lab 09/23/17 1337  LIPASE 18   No results for input(s): AMMONIA in the last 168 hours. CBC: Recent Labs  Lab 09/23/17 1337 09/25/17 0527 09/25/17 1452  WBC 8.3 4.5 5.0  NEUTROABS 5.0  --   --   HGB 16.3 12.9* 14.7  HCT 49.5 39.1 42.9  MCV 89.5 88.7 88.5  PLT 223 146* 162   Cardiac Enzymes: Recent Labs  Lab 09/23/17 1338  TROPONINI <0.03   BNP: Invalid input(s): POCBNP CBG: Recent Labs  Lab 09/24/17 1213 09/24/17 1613 09/24/17 2007 09/25/17 0002 09/25/17 0414  GLUCAP 116* 97 109* 93 109*   D-Dimer Recent Labs    09/23/17 1337  DDIMER <0.27   Hgb A1c No results for input(s): HGBA1C in the last 72 hours. Lipid Profile No results for input(s): CHOL, HDL, LDLCALC, TRIG, CHOLHDL, LDLDIRECT in the last 72 hours. Thyroid function studies No results for input(s): TSH, T4TOTAL, T3FREE, THYROIDAB in the last 72 hours.  Invalid input(s): FREET3 Anemia work up No results for input(s): VITAMINB12, FOLATE, FERRITIN, TIBC, IRON, RETICCTPCT in the last 72 hours. Urinalysis    Component Value Date/Time   COLORURINE YELLOW 09/25/2017 [place] 09/25/2017 0924   LABSPEC 1.004 (L) 09/25/2017 0924   PHURINE 5.0 09/25/2017 0924   GLUCOSEU NEGATIVE 09/25/2017 0924   HGBUR NEGATIVE 09/25/2017 0924   BILIRUBINUR NEGATIVE 09/25/2017 0924   KETONESUR 5 (A) 09/25/2017 0924   PROTEINUR NEGATIVE 09/25/2017 0924   NITRITE NEGATIVE 09/25/2017 0924   LEUKOCYTESUR NEGATIVE 09/25/2017 0924   Sepsis Labs Invalid input(s): PROCALCITONIN,  WBC,  LACTICIDVEN Microbiology Recent Results (from the past 240 hour(s))  Blood culture (routine x 2)     Status: None (Preliminary result)   Collection Time: 09/23/17  1:30 PM  Result Value Ref Range Status   Specimen  Description BLOOD RIGHT WRIST  Final   Special Requests IN PEDIATRIC BOTTLE Blood Culture adequate volume  Final   Culture   Final    NO GROWTH 2 DAYS Performed at [facility], [place] [address], [place], [place] [address]    Report  Status PENDING  Incomplete  Blood culture (routine x 2)     Status: None (Preliminary result)   Collection Time: 09/23/17  1:50 PM  Result Value Ref Range Status   Specimen Description BLOOD LEFT WRIST  Final   Special Requests IN PEDIATRIC BOTTLE Blood Culture adequate volume  Final   Culture   Final    NO GROWTH 2 DAYS Performed at [facility], [place] [address] [place], [place] [address]    Report Status PENDING  Incomplete     Time coordinating discharge: Over 30 minutes  SIGNED:   Edwin Dada, MD  [facility] 09/25/2017, 4:50 PM   If 7PM-7AM, please contact night-coverage [email or web address] Password [email or web address]

## 2017-09-26 LAB — URINE CULTURE

## 2017-09-26 NOTE — Progress Notes (Signed)
   Addendem: 09/25/2017 @ 4:30 pm CM spoke pt, mom and dad @ bedside regarding transition to home. Dad states he basically assists pt with ADLs and toileting needs when home and not working (works 3 12hr shifs/week) , however, states would like to see if pt can qualify for PCS. Referral is in place with [facility], pt/family spoke with liasion/ Betsey Holiday @ [phone] via phone @ bedside regarding  PCS needs. Mitzi Hansen to f/u with pt once d/c. Whitman Hero RN,BSN,CM

## 2017-09-28 LAB — CULTURE, BLOOD (ROUTINE X 2)
Culture: NO GROWTH
Culture: NO GROWTH
SPECIAL REQUESTS: ADEQUATE
Special Requests: ADEQUATE

## 2017-10-02 LAB — SULFONYLUREA HYPOGLYCEMICS PANEL, SERUM
Acetohexamide: NEGATIVE ug/mL (ref 20–60)
Chlorpropamide: NEGATIVE ug/mL (ref 75–250)
GLIMEPIRIDE: NEGATIVE ng/mL (ref 80–250)
GLYBURIDE: NEGATIVE ng/mL
Glipizide: NEGATIVE ng/mL (ref 200–1000)
Nateglinide: NEGATIVE ng/mL
Repaglinide: NEGATIVE ng/mL
TOLAZAMIDE: NEGATIVE ug/mL
TOLBUTAMIDE: NEGATIVE ug/mL (ref 40–100)

## 2017-10-05 LAB — PROINSULIN/INSULIN RATIO
INSULIN: 0.99 u[IU]/mL
PROINSULIN: 7.2 pmol/L
Proinsulin/Insulin Ratio: 109 %

## 2017-12-01 ENCOUNTER — Inpatient Hospital Stay (HOSPITAL_BASED_OUTPATIENT_CLINIC_OR_DEPARTMENT_OTHER)
Admission: EM | Admit: 2017-12-01 | Discharge: 2017-12-04 | DRG: 193 | Disposition: A | Discharge status: Home / Self Care | Payer: Managed Care, Other (non HMO) | Attending: Nephrology | Admitting: Nephrology

## 2017-12-01 ENCOUNTER — Emergency Department (HOSPITAL_BASED_OUTPATIENT_CLINIC_OR_DEPARTMENT_OTHER): Payer: Managed Care, Other (non HMO)

## 2017-12-01 ENCOUNTER — Other Ambulatory Visit: Payer: Self-pay

## 2017-12-01 ENCOUNTER — Encounter (HOSPITAL_BASED_OUTPATIENT_CLINIC_OR_DEPARTMENT_OTHER): Payer: Self-pay

## 2017-12-01 DIAGNOSIS — J9601 Acute respiratory failure with hypoxia: Secondary | ICD-10-CM | POA: Diagnosis present

## 2017-12-01 DIAGNOSIS — Z7401 Bed confinement status: Secondary | ICD-10-CM | POA: Diagnosis not present

## 2017-12-01 DIAGNOSIS — R64 Cachexia: Secondary | ICD-10-CM | POA: Diagnosis present

## 2017-12-01 DIAGNOSIS — G7100 Muscular dystrophy, unspecified: Secondary | ICD-10-CM | POA: Diagnosis not present

## 2017-12-01 DIAGNOSIS — Z681 Body mass index (BMI) 19 or less, adult: Secondary | ICD-10-CM | POA: Diagnosis not present

## 2017-12-01 DIAGNOSIS — R05 Cough: Secondary | ICD-10-CM | POA: Diagnosis present

## 2017-12-01 DIAGNOSIS — R000 Tachycardia, unspecified: Secondary | ICD-10-CM

## 2017-12-01 DIAGNOSIS — R0602 Shortness of breath: Secondary | ICD-10-CM | POA: Diagnosis not present

## 2017-12-01 DIAGNOSIS — Z79899 Other long term (current) drug therapy: Secondary | ICD-10-CM | POA: Diagnosis not present

## 2017-12-01 DIAGNOSIS — J40 Bronchitis, not specified as acute or chronic: Secondary | ICD-10-CM | POA: Diagnosis present

## 2017-12-01 DIAGNOSIS — M6259 Muscle wasting and atrophy, not elsewhere classified, multiple sites: Secondary | ICD-10-CM | POA: Diagnosis present

## 2017-12-01 DIAGNOSIS — J189 Pneumonia, unspecified organism: Secondary | ICD-10-CM | POA: Diagnosis present

## 2017-12-01 DIAGNOSIS — R0902 Hypoxemia: Secondary | ICD-10-CM | POA: Diagnosis not present

## 2017-12-01 DIAGNOSIS — J101 Influenza due to other identified influenza virus with other respiratory manifestations: Secondary | ICD-10-CM | POA: Diagnosis not present

## 2017-12-01 DIAGNOSIS — R509 Fever, unspecified: Secondary | ICD-10-CM | POA: Diagnosis not present

## 2017-12-01 DIAGNOSIS — G7109 Other specified muscular dystrophies: Secondary | ICD-10-CM | POA: Diagnosis present

## 2017-12-01 DIAGNOSIS — Y95 Nosocomial condition: Secondary | ICD-10-CM | POA: Diagnosis present

## 2017-12-01 DIAGNOSIS — J690 Pneumonitis due to inhalation of food and vomit: Secondary | ICD-10-CM | POA: Diagnosis present

## 2017-12-01 HISTORY — DX: Pneumonia, unspecified organism: J18.9

## 2017-12-01 LAB — DIFFERENTIAL
Basophils Absolute: 0 10*3/uL (ref 0.0–0.1)
Basophils Relative: 0 %
Eosinophils Absolute: 0 10*3/uL (ref 0.0–0.7)
Eosinophils Relative: 0 %
LYMPHS ABS: 0.7 10*3/uL (ref 0.7–4.0)
Lymphocytes Relative: 12 %
MONOS PCT: 8 %
Monocytes Absolute: 0.5 10*3/uL (ref 0.1–1.0)
NEUTROS ABS: 5.1 10*3/uL (ref 1.7–7.7)
Neutrophils Relative %: 80 %

## 2017-12-01 LAB — URINALYSIS, MICROSCOPIC (REFLEX)

## 2017-12-01 LAB — COMPREHENSIVE METABOLIC PANEL
ALBUMIN: 3.6 g/dL (ref 3.5–5.0)
ALT: 29 U/L (ref 17–63)
ANION GAP: 8 (ref 5–15)
AST: 30 U/L (ref 15–41)
Alkaline Phosphatase: 43 U/L (ref 38–126)
BUN: 6 mg/dL (ref 6–20)
CALCIUM: 8.3 mg/dL — AB (ref 8.9–10.3)
CO2: 33 mmol/L — AB (ref 22–32)
Chloride: 100 mmol/L — ABNORMAL LOW (ref 101–111)
Creatinine, Ser: <0.3 mg/dL — ABNORMAL LOW (ref 0.61–1.24)
GLUCOSE: 122 mg/dL — AB (ref 65–99)
POTASSIUM: 4 mmol/L (ref 3.5–5.1)
SODIUM: 141 mmol/L (ref 135–145)
Total Bilirubin: 0.6 mg/dL (ref 0.3–1.2)
Total Protein: 6.7 g/dL (ref 6.5–8.1)

## 2017-12-01 LAB — URINALYSIS, ROUTINE W REFLEX MICROSCOPIC
Bilirubin Urine: NEGATIVE
GLUCOSE, UA: NEGATIVE mg/dL
KETONES UR: 15 mg/dL — AB
LEUKOCYTES UA: NEGATIVE
NITRITE: NEGATIVE
PROTEIN: NEGATIVE mg/dL
Specific Gravity, Urine: 1.02 (ref 1.005–1.030)
pH: 6 (ref 5.0–8.0)

## 2017-12-01 LAB — CBC
HCT: 45.5 % (ref 39.0–52.0)
HEMOGLOBIN: 14.3 g/dL (ref 13.0–17.0)
MCH: 29.8 pg (ref 26.0–34.0)
MCHC: 31.4 g/dL (ref 30.0–36.0)
MCV: 94.8 fL (ref 78.0–100.0)
Platelets: 156 10*3/uL (ref 150–400)
RBC: 4.8 MIL/uL (ref 4.22–5.81)
RDW: 13.5 % (ref 11.5–15.5)
WBC: 6.4 10*3/uL (ref 4.0–10.5)

## 2017-12-01 LAB — INFLUENZA PANEL BY PCR (TYPE A & B)
INFLAPCR: POSITIVE — AB
INFLBPCR: NEGATIVE

## 2017-12-01 LAB — I-STAT CG4 LACTIC ACID, ED: Lactic Acid, Venous: 1.13 mmol/L (ref 0.5–1.9)

## 2017-12-01 MED ORDER — SODIUM CHLORIDE 0.9 % IV SOLN
INTRAVENOUS | Status: DC
  Administered 2017-12-01 – 2017-12-03 (×2): via INTRAVENOUS

## 2017-12-01 MED ORDER — VANCOMYCIN HCL 500 MG IV SOLR
500.0000 mg | Freq: Three times a day (TID) | INTRAVENOUS | Status: DC
  Administered 2017-12-02 – 2017-12-03 (×5): 500 mg via INTRAVENOUS
  Filled 2017-12-01 (×6): qty 500

## 2017-12-01 MED ORDER — SODIUM CHLORIDE 0.9 % IV BOLUS (SEPSIS)
1000.0000 mL | Freq: Once | INTRAVENOUS | Status: AC
  Administered 2017-12-01: 1000 mL via INTRAVENOUS

## 2017-12-01 MED ORDER — ALBUTEROL SULFATE (2.5 MG/3ML) 0.083% IN NEBU: 2.5000 mg | INHALATION_SOLUTION | RESPIRATORY_TRACT | Status: DC | PRN

## 2017-12-01 MED ORDER — CEFEPIME HCL 1 G IJ SOLR
INTRAMUSCULAR | Status: AC
  Filled 2017-12-01: qty 1

## 2017-12-01 MED ORDER — SODIUM CHLORIDE 0.9 % IV SOLN
1.0000 g | Freq: Once | INTRAVENOUS | Status: AC
  Administered 2017-12-01: 1 g via INTRAVENOUS

## 2017-12-01 MED ORDER — IOPAMIDOL (ISOVUE-370) INJECTION 76%
125.0000 mL | Freq: Once | INTRAVENOUS | Status: AC | PRN
  Administered 2017-12-01: 80 mL via INTRAVENOUS

## 2017-12-01 MED ORDER — SODIUM CHLORIDE 0.9 % IV BOLUS (SEPSIS)
250.0000 mL | Freq: Once | INTRAVENOUS | Status: AC
  Administered 2017-12-01: 250 mL via INTRAVENOUS

## 2017-12-01 MED ORDER — IPRATROPIUM-ALBUTEROL 0.5-2.5 (3) MG/3ML IN SOLN
3.0000 mL | Freq: Four times a day (QID) | RESPIRATORY_TRACT | Status: DC
  Administered 2017-12-01: 3 mL via RESPIRATORY_TRACT
  Filled 2017-12-01: qty 3

## 2017-12-01 MED ORDER — IPRATROPIUM-ALBUTEROL 0.5-2.5 (3) MG/3ML IN SOLN
3.0000 mL | Freq: Three times a day (TID) | RESPIRATORY_TRACT | Status: DC
Start: 2017-12-01 — End: 2017-12-04
  Administered 2017-12-01 – 2017-12-04 (×7): 3 mL via RESPIRATORY_TRACT
  Filled 2017-12-01 (×9): qty 3

## 2017-12-01 MED ORDER — ENOXAPARIN SODIUM 30 MG/0.3ML ~~LOC~~ SOLN
30.0000 mg | Freq: Every day | SUBCUTANEOUS | Status: DC
  Administered 2017-12-02 – 2017-12-03 (×2): 30 mg via SUBCUTANEOUS
  Filled 2017-12-01 (×2): qty 0.3

## 2017-12-01 MED ORDER — VANCOMYCIN HCL IN DEXTROSE 750-5 MG/150ML-% IV SOLN
750.0000 mg | Freq: Once | INTRAVENOUS | Status: AC
  Administered 2017-12-01: 750 mg via INTRAVENOUS
  Filled 2017-12-01: qty 150

## 2017-12-01 MED ORDER — IPRATROPIUM-ALBUTEROL 0.5-2.5 (3) MG/3ML IN SOLN: 3.0000 mL | RESPIRATORY_TRACT | Status: DC

## 2017-12-01 MED ORDER — SODIUM CHLORIDE 0.9 % IV SOLN
1.0000 g | Freq: Three times a day (TID) | INTRAVENOUS | Status: DC
  Administered 2017-12-01 – 2017-12-04 (×9): 1 g via INTRAVENOUS
  Filled 2017-12-01 (×10): qty 1

## 2017-12-01 MED ORDER — VANCOMYCIN HCL IN DEXTROSE 1-5 GM/200ML-% IV SOLN: 1000.0000 mg | Freq: Once | INTRAVENOUS | Status: DC

## 2017-12-01 MED ORDER — VANCOMYCIN HCL IN DEXTROSE 1-5 GM/200ML-% IV SOLN
INTRAVENOUS | Status: AC
  Filled 2017-12-01: qty 200

## 2017-12-01 MED ORDER — CEFEPIME HCL 2 G IJ SOLR: 2.0000 g | Freq: Once | INTRAMUSCULAR | Status: DC

## 2017-12-01 NOTE — ED Provider Notes (Addendum)
[facility] EMERGENCY DEPARTMENT Provider Note   CSN: [id number] Arrival date & time: 12/01/17  1206     History   Chief Complaint Chief Complaint  Patient presents with  . Shortness of Breath    HPI Peter Hayes is a 22 y.o. male.  HPI Patient was diagnosed influenza A six days ago.  He has been taking Tamiflu.  Patient reports that he got much more short of breath this morning and feels like his chest is very congested.  Patient had a fever last week but has not had fever over the past several days.  He has not had vomiting, diarrhea or abdominal pain.  Patient does not have history of home oxygen use or suctioning.  Patient has muscular dystrophy significant physical manifestations. Past Medical History:  Diagnosis Date  . MD (muscular dystrophy)   . Refusal of blood product    [name]    Patient Active Problem List   Diagnosis Date Noted  . Hypoglycemia without diagnosis of diabetes mellitus 09/24/2017  . Ileus ([place]) 09/23/2017  . Sepsis ([place]) 05/05/2016  . Muscular dystrophy 05/05/2016  . Moderate protein-calorie malnutrition ([place]) 05/05/2016  . Tachycardia 05/05/2016  . Leukocytosis 05/05/2016  . High anion gap metabolic acidosis 99/37/1696  . Respiratory alkalosis 05/05/2016    Past Surgical History:  Procedure Laterality Date  . EYE SURGERY         Home Medications    Prior to Admission medications   Medication Sig Start Date End Date Taking? Authorizing Provider  Coenzyme Q10 (COQ10) 100 MG CAPS Take 100 mg by mouth daily after breakfast.   Yes [provider]  montelukast (SINGULAIR) 10 MG tablet Take 10 mg by mouth at bedtime. 04/24/16  Yes [provider]  Selenium 100 MCG TABS Take 100 mcg by mouth daily after breakfast.   Yes [provider]  Cholecalciferol (VITAMIN D PO) Take 2 tablets by mouth daily after breakfast.    [provider]  clotrimazole (MYCELEX) 10 MG troche Take 1  tablet (10 mg total) by mouth 5 (five) times daily. Patient not taking: Reported on 09/24/2017 05/11/16   Velvet Bathe, MD    Family History History reviewed. No pertinent family history.  Social History Social History   Tobacco Use  . Smoking status: Never Smoker  . Smokeless tobacco: Never Used  Substance Use Topics  . Alcohol use: No  . Drug use: No     Allergies   Patient has no known allergies.   Review of Systems Review of Systems 10 Systems reviewed and are negative for acute change except as noted in the HPI. Physical Exam Updated Vital Signs BP 104/66   Pulse (!) 128   Temp 99.1 F (37.3 C) (Rectal)   Resp 18   Wt 36.4 kg (80 lb 4 oz)   SpO2 100%   BMI 13.77 kg/m   Physical Exam  Constitutional: He is oriented to person, place, and time.  Patient is awake and alert.  He is tachypneic.  Mild pale but not significantly so.  Speech is clear and mental status normal.  Significant atrophy of extremities  HENT:  Mucous membranes pink and moist.  Posterior oropharynx widely patent.  Nose normal.  Eyes: Conjunctivae and EOM are normal.  Neck: Neck supple.  Cardiovascular:  Tachycardia.  Cannot appreciate rub murmur gallop at this rate.  Pulmonary/Chest:  Tachypnea.  Breath sounds present on left but decreased diffusely on the right.  Abdominal:  Abdomen  scaphoid.  No tenderness.  Musculoskeletal:  Patient has significant atrophy of extremities and deviations of hands and feet.  Neurological: He is alert and oriented to person, place, and time.  Patient is alert and interactive.  He has severe mobility limitations due to his underlying muscular dystrophy.  Skin: Skin is warm and dry.  Psychiatric: He has a normal mood and affect.     ED Treatments / Results  Labs (all labs ordered are listed, but only abnormal results are displayed) Labs Reviewed  URINALYSIS, ROUTINE W REFLEX MICROSCOPIC - Abnormal; Notable for the following components:      Result  Value   Hgb urine dipstick TRACE (*)    Ketones, ur 15 (*)    All other components within normal limits  COMPREHENSIVE METABOLIC PANEL - Abnormal; Notable for the following components:   Chloride 100 (*)    CO2 33 (*)    Glucose, Bld 122 (*)    Creatinine, Ser <0.30 (*)    Calcium 8.3 (*)    All other components within normal limits  URINALYSIS, MICROSCOPIC (REFLEX) - Abnormal; Notable for the following components:   Bacteria, UA RARE (*)    Squamous Epithelial / LPF 0-5 (*)    All other components within normal limits  CULTURE, BLOOD (ROUTINE X 2)  CULTURE, BLOOD (ROUTINE X 2)  CBC  DIFFERENTIAL  CBC WITH DIFFERENTIAL/PLATELET  INFLUENZA PANEL BY PCR (TYPE A & B)  I-STAT CG4 LACTIC ACID, ED    EKG  EKG Interpretation  Date/Time:  Sunday December 01 2017 12:48:59 EST Ventricular Rate:  138 PR Interval:    QRS Duration: 131 QT Interval:  330 QTC Calculation: 500 R Axis:   104 Text Interpretation:  Sinus tachycardia Nonspecific intraventricular conduction delay Probable anteroseptal infarct, old Minimal ST depression, inferior leads Baseline wander in lead(s) II aVR no sig change fromprevious Confirmed by Arby Barrette [id number]) on 12/01/2017 3:17:46 PM       Radiology Dg Chest Port 1 View  Result Date: 12/01/2017 CLINICAL DATA:  Flu.  Shortness of breath. EXAM: PORTABLE CHEST 1 VIEW COMPARISON:  September 23, 2017 FINDINGS: Scoliotic curvature of the spine. No pneumothorax. The lungs are clear. The heart, hila, and mediastinum are stable. Air-filled prominent loops of bowel are seen in the upper abdomen, similar to the previous studies. No other acute abnormalities. IMPRESSION: 1. No acute abnormalities seen in the chest. 2. Air-filled prominent loops of bowel in the upper abdomen are similar to previous studies. Electronically Signed   By: Gerome Sam III M.D   On: 12/01/2017 13:03    Procedures Procedures (including critical care time) CRITICAL CARE Performed by:  Cristy Friedlander   Total critical care time: 30 minutes  Critical care time was exclusive of separately billable procedures and treating other patients.  Critical care was necessary to treat or prevent imminent or life-threatening deterioration.  Critical care was time spent personally by me on the following activities: development of treatment plan with patient and/or surrogate as well as nursing, discussions with consultants, evaluation of patient's response to treatment, examination of patient, obtaining history from patient or surrogate, ordering and performing treatments and interventions, ordering and review of laboratory studies, ordering and review of radiographic studies, pulse oximetry and re-evaluation of patient's condition. Medications Ordered in ED Medications  ceFEPIme (MAXIPIME) 1 g injection (not administered)  ipratropium-albuterol (DUONEB) 0.5-2.5 (3) MG/3ML nebulizer solution 3 mL (3 mLs Nebulization Given 12/01/17 1414)  vancomycin (VANCOCIN) 500 mg in sodium chloride 0.9 %  100 mL IVPB (not administered)  ceFEPIme (MAXIPIME) 1 g in sodium chloride 0.9 % 100 mL IVPB (not administered)  sodium chloride 0.9 % bolus 1,000 mL (0 mLs Intravenous Stopped 12/01/17 1344)    And  sodium chloride 0.9 % bolus 250 mL (0 mLs Intravenous Stopped 12/01/17 1344)  vancomycin (VANCOCIN) IVPB 750 mg/150 ml premix (0 mg Intravenous Stopped 12/01/17 1506)  ceFEPIme (MAXIPIME) 1 g in sodium chloride 0.9 % 100 mL IVPB (0 g Intravenous Stopped 12/01/17 1344)  iopamidol (ISOVUE-370) 76 % injection 125 mL (80 mLs Intravenous Contrast Given 12/01/17 1555)     Initial Impression / Assessment and Plan / ED Course  I have reviewed the triage vital signs and the nursing notes.  Pertinent labs & imaging results that were available during my care of the patient were reviewed by me and considered in my medical decision making (see chart for details).     Reviewed with [facility] hospital[facility] Dr. [place] Pink for  admission. Final Clinical Impressions(s) / ED Diagnoses   Final diagnoses:  Influenza A  Hypoxia  Tachycardia  HCAP (healthcare-associated pneumonia)  Presents with dyspnea and hypoxia.  He has significant comorbid illness of muscular dystrophy with significant physical disability.  And was influenza A positive at the beginning of the week.  Since he has developed productive cough and severe shortness of breath today.  He was hypoxic on arrival but responded well to supplemental oxygen.  CT PE study obtained due to persistent tachycardia and patient's perception that his right side felt like it was not getting any air.  CT does not show PE but parenchymal inflammatory changes consistent with secondary pneumonia.  Started on H CAP regimen based on previous hospitalization and comorbid condition.  ED Discharge Orders    None       Charlesetta Shanks, MD 12/01/17 1624    Charlesetta Shanks, MD 12/01/17 [phone]

## 2017-12-01 NOTE — Progress Notes (Signed)
Manual CPT done at this time. Patient tolerated well. I instructed family on how to do this at home as needed. Tried to get him to cough, very inofective. Attempted to suction orally, no secretions obtained. Stated he coughed up some secretions yesterday, stated he feels better now. SAT 98% on RA.

## 2017-12-01 NOTE — ED Notes (Signed)
O2 sat increased to 95% on [place] @ 2lpm

## 2017-12-01 NOTE — Progress Notes (Signed)
Pharmacy Antibiotic Note  Raghav Verrilli is a 22 y.o. male admitted on 12/01/2017 with SOB. Pharmacy has been consulted for vancomycin and cefepime dosing for PNA.  Patient is also on Tamiflu for influenza A.  SCr < 0.3, CrCL > 100 ml/min, afebrile, WBC WNL.   Plan: Vanc $RemoveBe'750mg'CcAZTKBxO$  IV x 1, then $Remov'500mg'uupver$  IV Q8H Cefepime 1gm IV Q8H Monitor renal fxn, clinical progress, vanc trough prior to 4th dose given muscular dystrophy    Weight: 80 lb 4 oz (36.4 kg)  Temp (24hrs), Avg:99.1 F (37.3 C), Min:99.1 F (37.3 C), Max:99.1 F (37.3 C)  Recent Labs  Lab 12/01/17 1245 12/01/17 1326  WBC  --  6.4  CREATININE  --  <0.30*  LATICACIDVEN 1.13  --     CrCl cannot be calculated (This lab value cannot be used to calculate CrCl because it is not a number: <0.30).    No Known Allergies   Vanc 2/24 >> Cefepime 2/24 >>  2/24 BCx -    Laneah Luft D. Mina Marble, PharmD, BCPS Pager:  [phone] 12/01/2017, 2:22 PM

## 2017-12-01 NOTE — ED Notes (Signed)
Unable to obtain 2 blood cultures or 2 IV's - DR. [place] notified.

## 2017-12-01 NOTE — ED Notes (Signed)
Portable CXR done.

## 2017-12-01 NOTE — ED Notes (Signed)
Room air O2 Saturation down to 88% - O2 via [place] reapplied @ 2lpm.

## 2017-12-01 NOTE — ED Triage Notes (Signed)
Pt reports Flu diagnosed on Monday - states shortness of breath since this morning, cough, feels congested. Pt has Muscular Dystrophy. Does not use O2 at home or suction. Fever last week - none since then. No vomiting, or diarrhea. Able to drink fluids and urinate per his norm.

## 2017-12-01 NOTE — H&P (Addendum)
History and Physical    Peter Hayes JQG:[id number] DOB: January 15, 1996 DOA: 12/01/2017  PCP: System, Pcp Not In  Patient coming from: Home  I have personally briefly reviewed patient's old medical records in [place]  Chief Complaint: SOB  HPI: Peter Hayes is a 22 y.o. male with medical history significant of MD, Peter Hayes witness.  Patient was admitted with Ileus back in Dec.  Patient was diagnosed with Influenza this past week.  Has been on Tamiflu starting Tues but stopped part way through course due to room spinning sensation side effects.  Had fever last week but none over past couple of days.  This morning developed sudden worsening of SOB, congestion.  No N/V/D abd pain.  No h/o home O2 use or suctioning.   ED Course: Influenza A positive.  CT shows aspiration vs PNA.  Put on cefepime and vanc.  O2 sat initially low but now satting well on 2L via [place].  Tachycardic up to as high as 140s.  1.25L NS bolus.  Hospitalist asked to admit.   Review of Systems: As per HPI otherwise 10 point review of systems negative.   Past Medical History:  Diagnosis Date  . MD (muscular dystrophy)   . Refusal of blood product    patient is Peter Hayes witness    Past Surgical History:  Procedure Laterality Date  . EYE SURGERY       reports that  has never smoked. he has never used smokeless tobacco. He reports that he does not drink alcohol or use drugs.  No Known Allergies  History reviewed. No pertinent family history.   Prior to Admission medications   Medication Sig Start Date End Date Taking? Authorizing Provider  Coenzyme Q10 (COQ10) 100 MG CAPS Take 100 mg by mouth daily after breakfast.   Yes [provider]  montelukast (SINGULAIR) 10 MG tablet Take 10 mg by mouth at bedtime. 04/24/16  Yes [provider]  Selenium 100 MCG TABS Take 100 mcg by mouth daily after breakfast.   Yes [provider]  Cholecalciferol (VITAMIN D PO) Take 2 tablets by mouth daily  after breakfast.    [provider]  clotrimazole (MYCELEX) 10 MG troche Take 1 tablet (10 mg total) by mouth 5 (five) times daily. Patient not taking: Reported on 09/24/2017 05/11/16   Velvet Bathe, MD  oseltamivir (TAMIFLU) 75 MG capsule Take 75 mg by mouth 2 (two) times daily. 11/25/17   [provider]    Physical Exam: Vitals:   12/01/17 1931 12/01/17 1945 12/01/17 2000 12/01/17 2047  BP: 109/77  105/72 121/86  Pulse: (!) 132 (!) 127 (!) 119 (!) 127  Resp: (!) 22     Temp:    98.2 F (36.8 C)  TempSrc:      SpO2: 99% 99% 99% 98%  Weight:        Constitutional: NAD, calm, comfortable Eyes: PERRL, lids and conjunctivae normal ENMT: Mucous membranes are moist. Posterior pharynx clear of any exudate or lesions.Normal dentition.  Neck: normal, supple, no masses, no thyromegaly Respiratory: clear to auscultation bilaterally, no wheezing, no crackles. Normal respiratory effort. No accessory muscle use.  Cardiovascular: Regular rate and rhythm, no murmurs / rubs / gallops. No extremity edema. 2+ pedal pulses. No carotid bruits.  Abdomen: no tenderness, no masses palpated. No hepatosplenomegaly. Bowel sounds positive.  Musculoskeletal: no clubbing / cyanosis. No joint deformity upper and lower extremities. Good ROM, no contractures. Normal muscle tone.  Significant muscle wasting diffusely. Skin: no  rashes, lesions, ulcers. No induration Neurologic: CN 2-12 grossly intact. Sensation intact, DTR normal. Strength 5/5 in all 4.  Psychiatric: Normal judgment and insight. Alert and oriented x 3. Normal mood.    Labs on Admission: I have personally reviewed following labs and imaging studies  CBC: Recent Labs  Lab 12/01/17 1326  WBC 6.4  NEUTROABS 5.1  HGB 14.3  HCT 45.5  MCV 94.8  PLT 841   Basic Metabolic Panel: Recent Labs  Lab 12/01/17 1326  NA 141  K 4.0  CL 100*  CO2 33*  GLUCOSE 122*  BUN 6  CREATININE <0.30*  CALCIUM 8.3*   GFR: CrCl cannot be  calculated (This lab value cannot be used to calculate CrCl because it is not a number: <0.30). Liver Function Tests: Recent Labs  Lab 12/01/17 1326  AST 30  ALT 29  ALKPHOS 43  BILITOT 0.6  PROT 6.7  ALBUMIN 3.6   No results for input(s): LIPASE, AMYLASE in the last 168 hours. No results for input(s): AMMONIA in the last 168 hours. Coagulation Profile: No results for input(s): INR, PROTIME in the last 168 hours. Cardiac Enzymes: No results for input(s): CKTOTAL, CKMB, CKMBINDEX, TROPONINI in the last 168 hours. BNP (last 3 results) No results for input(s): PROBNP in the last 8760 hours. HbA1C: No results for input(s): HGBA1C in the last 72 hours. CBG: No results for input(s): GLUCAP in the last 168 hours. Lipid Profile: No results for input(s): CHOL, HDL, LDLCALC, TRIG, CHOLHDL, LDLDIRECT in the last 72 hours. Thyroid Function Tests: No results for input(s): TSH, T4TOTAL, FREET4, T3FREE, THYROIDAB in the last 72 hours. Anemia Panel: No results for input(s): VITAMINB12, FOLATE, FERRITIN, TIBC, IRON, RETICCTPCT in the last 72 hours. Urine analysis:    Component Value Date/Time   COLORURINE YELLOW 12/01/2017 [place] 12/01/2017 1412   LABSPEC 1.020 12/01/2017 1412   PHURINE 6.0 12/01/2017 1412   GLUCOSEU NEGATIVE 12/01/2017 1412   HGBUR TRACE (A) 12/01/2017 1412   BILIRUBINUR NEGATIVE 12/01/2017 1412   KETONESUR 15 (A) 12/01/2017 1412   PROTEINUR NEGATIVE 12/01/2017 1412   NITRITE NEGATIVE 12/01/2017 1412   LEUKOCYTESUR NEGATIVE 12/01/2017 1412    Radiological Exams on Admission: Ct Angio Chest Pe W/cm &/or Wo Cm  Result Date: 12/01/2017 CLINICAL DATA:  Shortness of breath EXAM: CT ANGIOGRAPHY CHEST WITH CONTRAST TECHNIQUE: Multidetector CT imaging of the chest was performed using the standard protocol during bolus administration of intravenous contrast. Multiplanar CT image reconstructions and MIPs were obtained to evaluate the vascular anatomy.  CONTRAST:  57mL ISOVUE-370 IOPAMIDOL (ISOVUE-370) INJECTION 76% COMPARISON:  Chest radiograph 12/01/2017 FINDINGS: Cardiovascular: Contrast injection is sufficient to demonstrate satisfactory opacification of the pulmonary arteries to the segmental level. There is no pulmonary embolus. The main pulmonary artery is within normal limits for size. There is a normal 3-vessel arch branching pattern without evidence of acute aortic syndrome. There is noaortic atherosclerosis. Heart size is normal, without pericardial effusion. Mediastinum/Nodes: No mediastinal, hilar or axillary lymphadenopathy. The visualized thyroid and thoracic esophageal course are unremarkable. Lungs/Pleura: There are tree-in-bud opacities within the left-greater-than-right basilar lower lobes and within the posterior right upper lobe. No pleural effusion or pneumothorax. No large consolidation. Central airways are patent. Upper Abdomen: Contrast bolus timing is not optimized for evaluation of the abdominal organs. Within this limitation, the visualized organs of the upper abdomen are normal. Musculoskeletal: No chest wall abnormality. No acute or significant osseous findings. Review of the MIP images confirms the above findings.  IMPRESSION: 1. No pulmonary embolus or acute thoracic aortic syndrome. 2. Tree-in-bud opacities in both lung bases and within the posterior right upper lobe are likely secondary to aspiration. Electronically Signed   By: Ulyses Jarred M.D.   On: 12/01/2017 16:45   Dg Chest Port 1 View  Result Date: 12/01/2017 CLINICAL DATA:  Flu.  Shortness of breath. EXAM: PORTABLE CHEST 1 VIEW COMPARISON:  September 23, 2017 FINDINGS: Scoliotic curvature of the spine. No pneumothorax. The lungs are clear. The heart, hila, and mediastinum are stable. Air-filled prominent loops of bowel are seen in the upper abdomen, similar to the previous studies. No other acute abnormalities. IMPRESSION: 1. No acute abnormalities seen in the chest. 2.  Air-filled prominent loops of bowel in the upper abdomen are similar to previous studies. Electronically Signed   By: Dorise Bullion III M.D   On: 12/01/2017 13:03    EKG: Independently reviewed.  Assessment/Plan Principal Problem:   HCAP (healthcare-associated pneumonia) Active Problems:   Muscular dystrophy   Influenza A   Acute respiratory failure with hypoxia ([place])    1. HCAP - 1. PNA pathway 2. Cefepime and vanc 3. Cultures pending 4. IVF: 1.25L in ED, 75 cc/hr 5. Tele monitor for tachycardia 6. Cont pulse ox for initial low O2 sat 2. Influenza A - 1. Stopped tamiflu partway through course due to room spinning side effects 2. Discussed with patient and family, will leave him off of this for now.  DVT prophylaxis: Lovenox Code Status: Full Family Communication: Family at bedside Disposition Plan: Home after admit Consults called: None Admission status: Admit to inpatient   Etta Quill DO [facility] Pager [phone]  If 7AM-7PM, please contact day team taking care of patient [email or web address] Password [email or web address]  12/01/2017, 9:10 PM

## 2017-12-01 NOTE — ED Notes (Signed)
RT immediately to room. Pt placed on supplemental O2 6LPM. 93% SpO2 with that

## 2017-12-02 ENCOUNTER — Other Ambulatory Visit: Payer: Self-pay

## 2017-12-02 LAB — STREP PNEUMONIAE URINARY ANTIGEN: STREP PNEUMO URINARY ANTIGEN: NEGATIVE

## 2017-12-02 LAB — VANCOMYCIN, TROUGH: Vancomycin Tr: 37 ug/mL (ref 15–20)

## 2017-12-02 LAB — HIV ANTIBODY (ROUTINE TESTING W REFLEX): HIV Screen 4th Generation wRfx: NONREACTIVE

## 2017-12-02 LAB — PROCALCITONIN: Procalcitonin: <0.1 ng/mL

## 2017-12-02 MED ORDER — LACTINEX PO CHEW
2.0000 | CHEWABLE_TABLET | Freq: Three times a day (TID) | ORAL | Status: DC
  Administered 2017-12-02 – 2017-12-04 (×4): 2 via ORAL
  Filled 2017-12-02 (×8): qty 2

## 2017-12-02 MED ORDER — BACID PO TABS: 2.0000 | ORAL_TABLET | Freq: Three times a day (TID) | ORAL | Status: DC

## 2017-12-02 NOTE — Progress Notes (Signed)
PROGRESS NOTE    Patient: Peter Hayes     PCP: System, Pcp Not In                    DOB: [birth year]            DOA: 12/01/2017 [id number]:[id number]             DOS: 12/02/2017, 12:46 PM   LOS: 1 day   Date of Service: The patient was seen and examined on 12/02/2017  Subjective:  The patient was seen and examined this morning, Stable.  Reporting much improved symptoms. Patient seems to be bedbound at baseline severe upper and lower extremity muscle wasting with some contraction. Awake alert in no acute distress. O2 via nasal cannula ----------------------------------------------------------------------------------------------------------------------  Brief Narrative:   Peter Hayes is a 22 y.o. male with medical history significant of  muscular dystrophy, bedbound, jehovah witness.  Patient was admitted with Ileus back in Dec He was diagnosed now with influenza and pneumonia.  Was started on Tamiflu in past Tuesday but could not tolerated due to side effect at cessation of dizziness.  He has had subjective fever for the past couple days, his caregiver including dad was also sick possibly pneumonia who is been around him in the past weeks. On admission he had some shortness of breath, congestion. Admitted for continue symptomatic management of influenza A, and pneumonia. --------------------------------------------------------------------------------------------------------------------------  Principal Problem:   HCAP (healthcare-associated pneumonia) Active Problems:   Muscular dystrophy   Influenza A   Acute respiratory failure with hypoxia ([place])   Assessment & Plan:   Hospital-acquired pneumonia -In lieu of recent hospitalization, muscular dystrophy, cachexia Patient will be monitored closely, will follow the blood cultures, started on broad-spectrum antibiotics of vancomycin and Zosyn will be continued today will be narrowed down accordingly. Pending IV fluid hydration, was mildly  tachycardic and hypotensive we will monitor closely The O2 via nasal cannula, currently satting greater than 92%  Influenza A positive Continue supportive therapy, IV fluids, PRN Tylenol Stop Tamiflu due to side effects, patient and family aware  Cachexia, possible malnutrition  -with severe baseline muscular dystrophy Nutrition will be consulted for further evaluation dietary recommendations  Muscular dystrophy -Severe upper and lower extremity cachexia, muscle wasting Bedbound Monitoring closely   DVT prophylaxis: Lovenox Code Status: Full Family Communication: Family at bedside Disposition Plan: Home after admit Consults called: None Admission status:  inpatient    Procedures:  No admission procedures for hospital encounter.   Antimicrobials:  Anti-infectives (From admission, onward)   Start     Dose/Rate Route Frequency Ordered Stop   12/01/17 2200  vancomycin (VANCOCIN) 500 mg in sodium chloride 0.9 % 100 mL IVPB     500 mg 100 mL/hr over 60 Minutes Intravenous Every 8 hours 12/01/17 1424     12/01/17 2100  ceFEPIme (MAXIPIME) 1 g in sodium chloride 0.9 % 100 mL IVPB     1 g 200 mL/hr over 30 Minutes Intravenous Every 8 hours 12/01/17 1424     12/01/17 1251  ceFEPIme (MAXIPIME) 1 g injection    Comments:  Haskins, Kaila   : cabinet override      12/01/17 1251 12/02/17 0059   12/01/17 1245  ceFEPIme (MAXIPIME) 2 g in sodium chloride 0.9 % 100 mL IVPB  Status:  Discontinued     2 g 200 mL/hr over 30 Minutes Intravenous  Once 12/01/17 1237 12/01/17 1241   12/01/17 1245  vancomycin (VANCOCIN) IVPB 1000 mg/200 mL premix  Status:  Discontinued     1,000 mg 200 mL/hr over 60 Minutes Intravenous  Once 12/01/17 1237 12/01/17 1239   12/01/17 1245  vancomycin (VANCOCIN) IVPB 750 mg/150 ml premix     750 mg 150 mL/hr over 60 Minutes Intravenous  Once 12/01/17 1239 12/01/17 1506   12/01/17 1245  ceFEPIme (MAXIPIME) 1 g in sodium chloride 0.9 % 100 mL IVPB     1 g 200 mL/hr  over 30 Minutes Intravenous  Once 12/01/17 1241 12/01/17 1344       Objective: Vitals:   12/01/17 2048 12/01/17 2156 12/02/17 0533 12/02/17 0852  BP: 121/86  (!) 95/54   Pulse: (!) 127  99   Resp: 20  20   Temp: 98.2 F (36.8 C)  97.6 F (36.4 C)   TempSrc: Oral  Oral   SpO2: 98% 99% 100% 99%  Weight:        Intake/Output Summary (Last 24 hours) at 12/02/2017 1246 Last data filed at 12/02/2017 0101 Gross per 24 hour  Intake -  Output 200 ml  Net -200 ml   Filed Weights   12/01/17 1210  Weight: 36.4 kg (80 lb 4 oz)    Examination:  General exam: Awake alert comfortable on O2 via nasal cannula Severe generalized cachexia noted upper and lower extremity muscle wasting Psychiatry: Judgement and insight appear normal. Mood & affect appropriate. HEENT: WNLs Respiratory system: Clear to auscultation. Respiratory effort normal. Cardiovascular system: S1 & S2 heard, RRR. No JVD, murmurs, rubs, gallops or clicks. No pedal edema. Gastrointestinal system: Abd. nondistended, soft and nontender. No organomegaly or masses felt. Normal bowel sounds heard. Central nervous system: Alert and oriented.  At baseline with muscular dystrophy Cranial through 12 within normal limits, negative for any sensory or motor function in upper or lower extremities. Extremities: Unable to move upper or lower extremities, muscle wasting, cachexia. Skin: No rashes, lesions or ulcers   Data Reviewed: I have personally reviewed following labs and imaging studies  CBC: Recent Labs  Lab 12/01/17 1326  WBC 6.4  NEUTROABS 5.1  HGB 14.3  HCT 45.5  MCV 94.8  PLT 902   Basic Metabolic Panel: Recent Labs  Lab 12/01/17 1326  NA 141  K 4.0  CL 100*  CO2 33*  GLUCOSE 122*  BUN 6  CREATININE <0.30*  CALCIUM 8.3*   GFR: CrCl cannot be calculated (This lab value cannot be used to calculate CrCl because it is not a number: <0.30). Liver Function Tests: Recent Labs  Lab 12/01/17 1326  AST 30    ALT 29  ALKPHOS 43  BILITOT 0.6  PROT 6.7  ALBUMIN 3.6   Sepsis Labs: Recent Labs  Lab 12/01/17 1245 12/02/17 0637  PROCALCITON  --  <0.10  LATICACIDVEN 1.13  --     Recent Results (from the past 240 hour(s))  Blood Culture (routine x 2)     Status: None (Preliminary result)   Collection Time: 12/01/17 12:38 PM  Result Value Ref Range Status   Specimen Description   Final    BLOOD RIGHT FOREARM Performed at [facility], [address] [place], [place] [address]    Special Requests   Final    IN PEDIATRIC BOTTLE Blood Culture adequate volume Performed at [facility], [place] [address] [place], [place] [address]    Culture PENDING  Incomplete   Report Status PENDING  Incomplete      Radiology Studies: Ct Angio Chest Pe W/cm &/or Wo Cm  Result Date: 12/01/2017 CLINICAL DATA:  Shortness of breath EXAM: CT ANGIOGRAPHY CHEST WITH CONTRAST TECHNIQUE: Multidetector CT imaging of the chest was performed using the standard protocol during bolus administration of intravenous contrast. Multiplanar CT image reconstructions and MIPs were obtained to evaluate the vascular anatomy. CONTRAST:  22mL ISOVUE-370 IOPAMIDOL (ISOVUE-370) INJECTION 76% COMPARISON:  Chest radiograph 12/01/2017 FINDINGS: Cardiovascular: Contrast injection is sufficient to demonstrate satisfactory opacification of the pulmonary arteries to the segmental level. There is no pulmonary embolus. The main pulmonary artery is within normal limits for size. There is a normal 3-vessel arch branching pattern without evidence of acute aortic syndrome. There is noaortic atherosclerosis. Heart size is normal, without pericardial effusion. Mediastinum/Nodes: No mediastinal, hilar or axillary lymphadenopathy. The visualized thyroid and thoracic esophageal course are unremarkable. Lungs/Pleura: There are tree-in-bud opacities within the left-greater-than-right basilar lower lobes and within the posterior right upper  lobe. No pleural effusion or pneumothorax. No large consolidation. Central airways are patent. Upper Abdomen: Contrast bolus timing is not optimized for evaluation of the abdominal organs. Within this limitation, the visualized organs of the upper abdomen are normal. Musculoskeletal: No chest wall abnormality. No acute or significant osseous findings. Review of the MIP images confirms the above findings. IMPRESSION: 1. No pulmonary embolus or acute thoracic aortic syndrome. 2. Tree-in-bud opacities in both lung bases and within the posterior right upper lobe are likely secondary to aspiration. Electronically Signed   By: Ulyses Jarred M.D.   On: 12/01/2017 16:45   Dg Chest Port 1 View  Result Date: 12/01/2017 CLINICAL DATA:  Flu.  Shortness of breath. EXAM: PORTABLE CHEST 1 VIEW COMPARISON:  September 23, 2017 FINDINGS: Scoliotic curvature of the spine. No pneumothorax. The lungs are clear. The heart, hila, and mediastinum are stable. Air-filled prominent loops of bowel are seen in the upper abdomen, similar to the previous studies. No other acute abnormalities. IMPRESSION: 1. No acute abnormalities seen in the chest. 2. Air-filled prominent loops of bowel in the upper abdomen are similar to previous studies. Electronically Signed   By: Dorise Bullion III M.D   On: 12/01/2017 13:03    Scheduled Meds: . enoxaparin (LOVENOX) injection  30 mg Subcutaneous QHS  . ipratropium-albuterol  3 mL Nebulization TID  . lactobacillus acidophilus & bulgar  2 tablet Oral TID WC   Continuous Infusions: . sodium chloride 100 mL/hr at 12/02/17 0813  . ceFEPime (MAXIPIME) IV 1 g (12/02/17 0550)  . vancomycin Stopped (12/02/17 8887)    Time spent: >25 minutes  Deatra James, MD [facility],  Pager [phone]   If 7PM-7AM, please contact night-coverage [email or web address]   Password [facility]  12/02/2017, 12:46 PM

## 2017-12-02 NOTE — Progress Notes (Signed)
CRITICAL VALUE ALERT  Critical Value:  Vancomycin Level 37  Date & Time Notied:  12/02/17 2225  Provider Notified: Pharmacy Aware  Orders Received/Actions taken: Vanc hung early today, pharmacy will ignore this lab and redraw tomorrow.  Day shift RN will be made aware to wait to hang Vanc until lab is drawn.

## 2017-12-02 NOTE — Progress Notes (Addendum)
Pharmacy - Brief Note  Vancomycin monitoring  - Trough ordered with 1400 and 2200 but on both occassions dose given prior to trough being drawn.  - dose given at 21:00 and trough drawn at 21:44 = 37 mcg/mL (but drawn during infusion)  Plan:   Since vancomycin doses given x 2 before trough levels drawn, will move vancomycin to 8am and reorder vancomycin trough for 2/26am at [place], PharmD, BCPS.   Pager: [phone] 12/02/2017 10:45 PM

## 2017-12-03 LAB — CBC
HEMATOCRIT: 40.1 % (ref 39.0–52.0)
HEMOGLOBIN: 12.5 g/dL — AB (ref 13.0–17.0)
MCH: 29.9 pg (ref 26.0–34.0)
MCHC: 31.2 g/dL (ref 30.0–36.0)
MCV: 95.9 fL (ref 78.0–100.0)
PLATELETS: 155 10*3/uL (ref 150–400)
RBC: 4.18 MIL/uL — AB (ref 4.22–5.81)
RDW: 13.3 % (ref 11.5–15.5)
WBC: 4.8 10*3/uL (ref 4.0–10.5)

## 2017-12-03 LAB — BASIC METABOLIC PANEL
Anion gap: 9 (ref 5–15)
BUN: <5 mg/dL — ABNORMAL LOW (ref 6–20)
CHLORIDE: 99 mmol/L — AB (ref 101–111)
CO2: 30 mmol/L (ref 22–32)
Calcium: 8.3 mg/dL — ABNORMAL LOW (ref 8.9–10.3)
Glucose, Bld: 85 mg/dL (ref 65–99)
POTASSIUM: 3.8 mmol/L (ref 3.5–5.1)
SODIUM: 138 mmol/L (ref 135–145)

## 2017-12-03 LAB — VANCOMYCIN, TROUGH: Vancomycin Tr: 7 ug/mL — ABNORMAL LOW (ref 15–20)

## 2017-12-03 LAB — PROCALCITONIN: Procalcitonin: <0.1 ng/mL

## 2017-12-03 MED ORDER — PREDNISONE 20 MG PO TABS: 40.0000 mg | ORAL_TABLET | Freq: Every day | ORAL | Status: DC

## 2017-12-03 MED ORDER — PREDNISONE 20 MG PO TABS
40.0000 mg | ORAL_TABLET | Freq: Every day | ORAL | Status: DC
  Administered 2017-12-03 – 2017-12-04 (×2): 40 mg via ORAL
  Filled 2017-12-03 (×2): qty 2

## 2017-12-03 MED ORDER — ENSURE ENLIVE PO LIQD
237.0000 mL | Freq: Two times a day (BID) | ORAL | Status: DC
  Administered 2017-12-03 – 2017-12-04 (×3): 237 mL via ORAL

## 2017-12-03 NOTE — Progress Notes (Signed)
Patient ID: Peter Hayes, male   DOB: 08-06-1996, 22 y.o.   MRN: [id number]  PROGRESS NOTE    Nyan Dufresne  OIZ:[id number] DOB: 05-30-1996 DOA: 12/01/2017 PCP: System, Pcp Not In   Brief Narrative:  22 year old male with history of muscular dystrophy, bedbound, Jehovah's Witness, ileus who was diagnosed with influenza this past week and started on Tamiflu but stopped because of dizziness presented with worsening shortness of breath and congestion.  He was started on intravenous antibiotics for pneumonia.   Assessment & Plan:   Principal Problem:   HCAP (healthcare-associated pneumonia) Active Problems:   Muscular dystrophy   Influenza A   Acute respiratory failure with hypoxia ([place])   Healthcare associated pneumonia with concern for aspiration -Currently on cefepime and vancomycin.  No evidence of MRSA infection.  Discontinue vancomycin -Cultures negative so far -Repeat a.m. labs -Probable transition to oral antibiotics in the next 24-48 hours -SLP evaluation  Hypoxia probably secondary to above -Still requiring 2 L oxygen via nasal cannula.  Wean as able.  Patient might need home oxygen because of his muscular dystrophy.  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  Fall precautions.  Nutrition consult    DVT prophylaxis: Lovenox Code Status: Full Family Communication: Spoke to father at bedside  disposition Plan: Home in 1-2 days  Consultants: None  Procedures: None  Antimicrobials:  Cefepime and vancomycin from 12/01/2017 onwards   Subjective: Patient seen and examined at bedside.  He feels better.  Still having intermittent cough.  No overnight fever or vomiting.  Objective: Vitals:   12/02/17 2059 12/02/17 2243  12/03/17 0557 12/03/17 0900  BP: 103/64  101/61   Pulse: (!) 110  92   Resp: 17  20   Temp: 98.2 F (36.8 C)  97.7 F (36.5 C)   TempSrc: Oral  Oral   SpO2: 100% 100% 100% 98%  Weight:        Intake/Output Summary (Last 24 hours) at 12/03/2017 1032 Last data filed at 12/03/2017 0600 Gross per 24 hour  Intake 4200.83 ml  Output -  Net 4200.83 ml   Filed Weights   12/01/17 1210  Weight: 36.4 kg (80 lb 4 oz)    Examination:  General exam: Appears calm and comfortable  Respiratory system: Bilateral decreased breath sound at bases with scattered crackles Cardiovascular system: S1 & S2 heard, rate controlled  gastrointestinal system: Abdomen is nondistended, soft and nontender. Normal bowel sounds heard. Extremities: No cyanosis, clubbing, edema.  Muscle wasting present      Data Reviewed: I have personally reviewed following labs and imaging studies  CBC: Recent Labs  Lab 12/01/17 1326 12/03/17 0623  WBC 6.4 4.8  NEUTROABS 5.1  --   HGB 14.3 12.5*  HCT 45.5 40.1  MCV 94.8 95.9  PLT 156 338   Basic Metabolic Panel: Recent Labs  Lab 12/01/17 1326 12/03/17 0605  NA 141 138  K 4.0 3.8  CL 100* 99*  CO2 33* 30  GLUCOSE 122* 85  BUN 6 <5*  CREATININE <0.30* <0.30*  CALCIUM 8.3* 8.3*   GFR: CrCl cannot be calculated (This lab value cannot be used to calculate CrCl because it is not a number: <0.30). Liver Function Tests: Recent Labs  Lab 12/01/17 1326  AST 30  ALT  29  ALKPHOS 43  BILITOT 0.6  PROT 6.7  ALBUMIN 3.6   No results for input(s): LIPASE, AMYLASE in the last 168 hours. No results for input(s): AMMONIA in the last 168 hours. Coagulation Profile: No results for input(s): INR, PROTIME in the last 168 hours. Cardiac Enzymes: No results for input(s): CKTOTAL, CKMB, CKMBINDEX, TROPONINI in the last 168 hours. BNP (last 3 results) No results for input(s): PROBNP in the last 8760 hours. HbA1C: No results for input(s): HGBA1C in the last 72  hours. CBG: No results for input(s): GLUCAP in the last 168 hours. Lipid Profile: No results for input(s): CHOL, HDL, LDLCALC, TRIG, CHOLHDL, LDLDIRECT in the last 72 hours. Thyroid Function Tests: No results for input(s): TSH, T4TOTAL, FREET4, T3FREE, THYROIDAB in the last 72 hours. Anemia Panel: No results for input(s): VITAMINB12, FOLATE, FERRITIN, TIBC, IRON, RETICCTPCT in the last 72 hours. Sepsis Labs: Recent Labs  Lab 12/01/17 1245 12/02/17 0637 12/03/17 0605  PROCALCITON  --  <0.10 <0.10  LATICACIDVEN 1.13  --   --     Recent Results (from the past 240 hour(s))  Blood Culture (routine x 2)     Status: None (Preliminary result)   Collection Time: 12/01/17 12:38 PM  Result Value Ref Range Status   Specimen Description   Final    BLOOD RIGHT FOREARM Performed at [facility], [address] [place], [place] [address]    Special Requests IN PEDIATRIC BOTTLE Blood Culture adequate volume  Final   Culture   Final    NO GROWTH < 24 HOURS Performed at [facility], [place] [address] [place], [place] [address]    Report Status PENDING  Incomplete         Radiology Studies: Ct Angio Chest Pe W/cm &/or Wo Cm  Result Date: 12/01/2017 CLINICAL DATA:  Shortness of breath EXAM: CT ANGIOGRAPHY CHEST WITH CONTRAST TECHNIQUE: Multidetector CT imaging of the chest was performed using the standard protocol during bolus administration of intravenous contrast. Multiplanar CT image reconstructions and MIPs were obtained to evaluate the vascular anatomy. CONTRAST:  52mL ISOVUE-370 IOPAMIDOL (ISOVUE-370) INJECTION 76% COMPARISON:  Chest radiograph 12/01/2017 FINDINGS: Cardiovascular: Contrast injection is sufficient to demonstrate satisfactory opacification of the pulmonary arteries to the segmental level. There is no pulmonary embolus. The main pulmonary artery is within normal limits for size. There is a normal 3-vessel arch branching pattern without evidence of acute  aortic syndrome. There is noaortic atherosclerosis. Heart size is normal, without pericardial effusion. Mediastinum/Nodes: No mediastinal, hilar or axillary lymphadenopathy. The visualized thyroid and thoracic esophageal course are unremarkable. Lungs/Pleura: There are tree-in-bud opacities within the left-greater-than-right basilar lower lobes and within the posterior right upper lobe. No pleural effusion or pneumothorax. No large consolidation. Central airways are patent. Upper Abdomen: Contrast bolus timing is not optimized for evaluation of the abdominal organs. Within this limitation, the visualized organs of the upper abdomen are normal. Musculoskeletal: No chest wall abnormality. No acute or significant osseous findings. Review of the MIP images confirms the above findings. IMPRESSION: 1. No pulmonary embolus or acute thoracic aortic syndrome. 2. Tree-in-bud opacities in both lung bases and within the posterior right upper lobe are likely secondary to aspiration. Electronically Signed   By: Ulyses Jarred M.D.   On: 12/01/2017 16:45   Dg Chest Port 1 View  Result Date: 12/01/2017 CLINICAL DATA:  Flu.  Shortness of breath. EXAM: PORTABLE CHEST 1 VIEW COMPARISON:  September 23, 2017 FINDINGS: Scoliotic curvature of the  spine. No pneumothorax. The lungs are clear. The heart, hila, and mediastinum are stable. Air-filled prominent loops of bowel are seen in the upper abdomen, similar to the previous studies. No other acute abnormalities. IMPRESSION: 1. No acute abnormalities seen in the chest. 2. Air-filled prominent loops of bowel in the upper abdomen are similar to previous studies. Electronically Signed   By: Dorise Bullion III M.D   On: 12/01/2017 13:03        Scheduled Meds: . enoxaparin (LOVENOX) injection  30 mg Subcutaneous QHS  . ipratropium-albuterol  3 mL Nebulization TID  . lactobacillus acidophilus & bulgar  2 tablet Oral TID WC   Continuous Infusions: . sodium chloride 100 mL/hr at  12/03/17 0330  . ceFEPime (MAXIPIME) IV Stopped (12/03/17 0448)  . vancomycin Stopped (12/03/17 0949)     LOS: 2 days        Aline August, MD [facility] Pager [phone]  If 7PM-7AM, please contact night-coverage [email or web address] Password [facility] 12/03/2017, 10:32 AM

## 2017-12-03 NOTE — Evaluation (Signed)
Clinical/Bedside Swallow Evaluation Patient Details  Name: Peter Hayes MRN: [id number] Date of Birth: Apr 20, 1996  Today's Date: 12/03/2017 Time: SLP Start Time (ACUTE ONLY): 71 SLP Stop Time (ACUTE ONLY): 1552 SLP Time Calculation (min) (ACUTE ONLY): 12 min  Past Medical History:  Past Medical History:  Diagnosis Date  . MD (muscular dystrophy)   . Refusal of blood product    patient is Peter Hayes witness   Past Surgical History:  Past Surgical History:  Procedure Laterality Date  . EYE SURGERY     HPI:  22 year old male with history of muscular dystrophy, bedbound, Jehovah's Witness, ileus who was diagnosed with influenza this past week and started on Tamiflu but stopped because of dizziness presented with worsening shortness of breath and congestion.Dx'd with pna; MD with concerns for aspiration.  Followed by SLP services July of 2017 with dx of mild oral, but no pharyngeal dysphagia.  Recent chest CT revealed "tree-in-bud opacities in both lung bases and within the posterior right upper lobe are likely secondary to aspiration."   Assessment / Plan / Recommendation Clinical Impression  Pt presents with a functional oropharyngeal swallow - his father was assisting him with lunch upon entering room.  There was normal strength/mobility of CN V, VII, XII.  Pt masticated regular solids slowly but effectively.  Swallow response was brisk.  There were no overt s/s of aspiration during clinical assessment, and when pt/father were asked, they described no changes or problems in swallow function.    However, recent chest CT with markings of potential aspiration; voice is low volume, cough is weak.  Recommend continuing current diet - regular solids, thin liquids-  For now. SLP will f/u next date to assess ongoing toleration and determine if MBS would be valuable.   D/W pt and father, who agree with plan.  SLP Visit Diagnosis: Dysphagia, unspecified (R13.10)    Aspiration Risk       Diet  Recommendation     Medication Administration: Whole meds with liquid    Other  Recommendations Oral Care Recommendations: Oral care BID   Follow up Recommendations None      Frequency and Duration min 1 x/week  1 week       Prognosis        Swallow Study   General HPI: 22 year old male with history of muscular dystrophy, bedbound, Jehovah's Witness, ileus who was diagnosed with influenza this past week and started on Tamiflu but stopped because of dizziness presented with worsening shortness of breath and congestion.Dx'd with pna; MD with concerns for aspiration.  Followed by SLP services July of 2017 with dx of mild oral, but no pharyngeal dysphagia.  Recent chest CT revealed "tree-in-bud opacities in both lung bases and within the posterior right upper lobe are likely secondary to aspiration." Type of Study: Bedside Swallow Evaluation Previous Swallow Assessment: see HPI Diet Prior to this Study: Regular;Thin liquids Temperature Spikes Noted: No Respiratory Status: Room air History of Recent Intubation: No Behavior/Cognition: Alert;Cooperative Oral Cavity Assessment: Within Functional Limits Oral Care Completed by SLP: No Oral Cavity - Dentition: Adequate natural dentition Vision: Functional for self-feeding Self-Feeding Abilities: Total assist Patient Positioning: Upright in bed Baseline Vocal Quality: Normal;Low vocal intensity Volitional Cough: Weak Volitional Swallow: Able to elicit    Oral/Motor/Sensory Function Overall Oral Motor/Sensory Function: Within functional limits   Ice Chips Ice chips: Within functional limits   Thin Liquid Thin Liquid: Within functional limits    Nectar Thick Nectar Thick Liquid: Not tested   Honey Thick Honey  Thick Liquid: Not tested   Puree Puree: Not tested   Solid   GO   Solid: Within functional limits        Peter Hayes 12/03/2017,4:02 PM

## 2017-12-03 NOTE — Progress Notes (Signed)
Initial Nutrition Assessment  DOCUMENTATION CODES:   Underweight  INTERVENTION:   Ensure Enlive po BID, each supplement provides 350 kcal and 20 grams of protein  NUTRITION DIAGNOSIS:   Increased nutrient needs related to acute illness(flu/PNA) as evidenced by estimated needs.  GOAL:   Patient will meet greater than or equal to 90% of their needs   MONITOR:   PO intake, Supplement acceptance, Weight trends, Labs  REASON FOR ASSESSMENT:   Consult Assessment of nutrition requirement/status  ASSESSMENT:   Pt with PMH significant for muscular dystrophy (wheel chair bound), recently diagnosed with the flu. Presents this admission with healthcare associated pneumonia with concern for aspiration.   Spoke with pt and father at bedside. Pt denies any recent loss in appetite. States he typically consumes three meals per day but some times can only tolerate two. Meals consist of a meat, beans, vegetables, and a grain. Appetite did not get worse or better with recent diagnosis of the flu. Pt does not use supplementation. Discussed adding Ensure if pt is unable to get three meals per day in. Pt eating 100% of breakfast this morning. RD observed scrambled eggs and potatoes at bedside. Denies any swallowing issues.   Pt denies any recent wt loss. RD obtained bed weight of  79.1 lb. Records indicate pt weighed 80 lb 09/25/17. Insignificant wt loss for time frame. Nutrition Focused Physical Exam not applicable given pt muscle loss disease. Do not suspect malnutrition at this time.   Medications reviewed and include: lactobacillus, prednisone, IV abx Labs reviewed: BUN <5 (L) Creatinine <0.30 (L)   Diet Order:  Diet regular Room service appropriate? Yes; Fluid consistency: Thin  EDUCATION NEEDS:   Education needs have been addressed  Skin:  Skin Assessment: Reviewed RN Assessment  Last BM:  11/30/17  Height:   Ht Readings from Last 1 Encounters:  05/07/16 $RemoveB'5\' 4"'Dvwlubnq$  (1.626 m)     Weight:   Wt Readings from Last 1 Encounters:  12/03/17 79 lb 1.6 oz (35.9 kg)    Ideal Body Weight:  56.3 kg  BMI:  Body mass index is 13.58 kg/m.  Estimated Nutritional Needs:   Kcal:  1000-1200 kcal/day  Protein:  50-60 g/day  Fluid:  > 1 L/day    Mariana Single RD, LDN Clinical Nutrition Pager # [phone]

## 2017-12-04 ENCOUNTER — Encounter (HOSPITAL_COMMUNITY): Payer: Self-pay

## 2017-12-04 ENCOUNTER — Emergency Department (HOSPITAL_COMMUNITY): Payer: Managed Care, Other (non HMO)

## 2017-12-04 ENCOUNTER — Emergency Department (HOSPITAL_COMMUNITY)
Admission: EM | Admit: 2017-12-04 | Discharge: 2017-12-05 | Disposition: A | Discharge status: Home / Self Care | Payer: Managed Care, Other (non HMO) | Attending: Emergency Medicine | Admitting: Emergency Medicine

## 2017-12-04 DIAGNOSIS — J189 Pneumonia, unspecified organism: Secondary | ICD-10-CM | POA: Diagnosis not present

## 2017-12-04 DIAGNOSIS — R0602 Shortness of breath: Secondary | ICD-10-CM | POA: Insufficient documentation

## 2017-12-04 DIAGNOSIS — R509 Fever, unspecified: Secondary | ICD-10-CM | POA: Insufficient documentation

## 2017-12-04 DIAGNOSIS — Z79899 Other long term (current) drug therapy: Secondary | ICD-10-CM | POA: Insufficient documentation

## 2017-12-04 LAB — CBC WITH DIFFERENTIAL/PLATELET
Basophils Absolute: 0 10*3/uL (ref 0.0–0.1)
Basophils Relative: 0 %
Eosinophils Absolute: 0 10*3/uL (ref 0.0–0.7)
Eosinophils Relative: 0 %
HEMATOCRIT: 39 % (ref 39.0–52.0)
HEMOGLOBIN: 12.4 g/dL — AB (ref 13.0–17.0)
LYMPHS ABS: 1.2 10*3/uL (ref 0.7–4.0)
LYMPHS PCT: 21 %
MCH: 30 pg (ref 26.0–34.0)
MCHC: 31.8 g/dL (ref 30.0–36.0)
MCV: 94.2 fL (ref 78.0–100.0)
MONOS PCT: 13 %
Monocytes Absolute: 0.7 10*3/uL (ref 0.1–1.0)
NEUTROS ABS: 3.6 10*3/uL (ref 1.7–7.7)
NEUTROS PCT: 66 %
Platelets: 173 10*3/uL (ref 150–400)
RBC: 4.14 MIL/uL — ABNORMAL LOW (ref 4.22–5.81)
RDW: 13.2 % (ref 11.5–15.5)
WBC: 5.5 10*3/uL (ref 4.0–10.5)

## 2017-12-04 LAB — BASIC METABOLIC PANEL
ANION GAP: 5 (ref 5–15)
BUN: 9 mg/dL (ref 6–20)
CHLORIDE: 99 mmol/L — AB (ref 101–111)
CO2: 33 mmol/L — AB (ref 22–32)
Calcium: 8.5 mg/dL — ABNORMAL LOW (ref 8.9–10.3)
Creatinine, Ser: <0.3 mg/dL — ABNORMAL LOW (ref 0.61–1.24)
Glucose, Bld: 95 mg/dL (ref 65–99)
Potassium: 3.6 mmol/L (ref 3.5–5.1)
Sodium: 137 mmol/L (ref 135–145)

## 2017-12-04 LAB — PROCALCITONIN: Procalcitonin: <0.1 ng/mL

## 2017-12-04 LAB — I-STAT CG4 LACTIC ACID, ED: Lactic Acid, Venous: 0.77 mmol/L (ref 0.5–1.9)

## 2017-12-04 LAB — MAGNESIUM: Magnesium: 1.9 mg/dL (ref 1.7–2.4)

## 2017-12-04 MED ORDER — SODIUM CHLORIDE 0.9 % IV BOLUS (SEPSIS)
1000.0000 mL | Freq: Once | INTRAVENOUS | Status: AC
  Administered 2017-12-04: 1000 mL via INTRAVENOUS

## 2017-12-04 MED ORDER — CEPHALEXIN 500 MG PO CAPS: 500.0000 mg | ORAL_CAPSULE | Freq: Two times a day (BID) | ORAL | 0 refills | Status: AC

## 2017-12-04 MED ORDER — CEFDINIR 300 MG PO CAPS: 300.0000 mg | ORAL_CAPSULE | Freq: Two times a day (BID) | ORAL | 0 refills | Status: DC

## 2017-12-04 NOTE — ED Provider Notes (Addendum)
[place] DEPT Provider Note   CSN: [id number] Arrival date & time: 12/04/17  2213     History   Chief Complaint Chief Complaint  Patient presents with  . Cough    HPI Peter Hayes is a 22 y.o. male.  HPI   22 year old male with history of muscular dystrophy here with cough and shortness of breath.  The patient was just recently hospitalized for healthcare associated pneumonia thought secondary to aspiration as well as influenza.  He was given broad-spectrum antibiotics and had gradual improvement.  He was sent home around 4 PM this afternoon.  Upon returning home, the patient states he initially felt well but then had increasing shortness of breath.  He began spiking a fever up to 102, or so he believes.  He also began coughing significantly more with shortness of breath and a sensation that he could not get the secretions up.  He had multiple severe coughing spells.  He subsequent presents for further evaluation.  Denies any abdominal pain, nausea, or vomiting.  He states he did feel mildly improved when he went home, but he states he was not coughing at that time.  It does not use oxygen at home.  Of note, he completed a course of Tamiflu in the hospital.  Past Medical History:  Diagnosis Date  . MD (muscular dystrophy)   . Refusal of blood product    patient is Peter Hayes witness    Patient Active Problem List   Diagnosis Date Noted  . HCAP (healthcare-associated pneumonia) 12/01/2017  . Hypoglycemia without diagnosis of diabetes mellitus 09/24/2017  . Ileus ([place]) 09/23/2017  . Sepsis ([place]) 05/05/2016  . Muscular dystrophy 05/05/2016  . Moderate protein-calorie malnutrition ([place]) 05/05/2016  . Tachycardia 05/05/2016  . Leukocytosis 05/05/2016  . High anion gap metabolic acidosis 99/35/7017  . Respiratory alkalosis 05/05/2016    Past Surgical History:  Procedure Laterality Date  . EYE SURGERY         Home Medications    Prior to  Admission medications   Medication Sig Start Date End Date Taking? Authorizing Provider  Coenzyme Q10 (COQ10) 100 MG CAPS Take 100 mg by mouth daily after breakfast.   Yes [provider]  Selenium 100 MCG TABS Take 100 mcg by mouth daily after breakfast.   Yes [provider]  cephALEXin (KEFLEX) 500 MG capsule Take 1 capsule (500 mg total) by mouth 2 (two) times daily for 8 doses. 12/04/17 12/08/17  Roney Jaffe, MD  guaiFENesin (MUCINEX) 600 MG 12 hr tablet Take 2 tablets (1,200 mg total) by mouth 2 (two) times daily for 7 days. 12/05/17 12/12/17  Duffy Bruce, MD    Family History History reviewed. No pertinent family history.  Social History Social History   Tobacco Use  . Smoking status: Never Smoker  . Smokeless tobacco: Never Used  Substance Use Topics  . Alcohol use: No  . Drug use: No     Allergies   Patient has no known allergies.   Review of Systems Review of Systems  Constitutional: Positive for chills and fatigue.  Respiratory: Positive for cough and shortness of breath.   Neurological: Positive for weakness.  All other systems reviewed and are negative.    Physical Exam Updated Vital Signs BP 127/80   Pulse 89   Temp 98.9 F (37.2 C) (Rectal)   Resp (!) 22   Ht $R'5\' 5"'zT$  (1.651 m)   Wt 35.8 kg (79 lb)   SpO2 93%   BMI  13.15 kg/m   Physical Exam  Constitutional: He is oriented to person, place, and time. He appears well-developed and well-nourished. No distress.  HENT:  Head: Normocephalic and atraumatic.  Eyes: Conjunctivae are normal.  Neck: Neck supple.  Cardiovascular: Regular rhythm and normal heart sounds. Tachycardia present. Exam reveals no friction rub.  No murmur heard. Pulmonary/Chest: Effort normal. Tachypnea noted. No respiratory distress. He has decreased breath sounds. He has no wheezes. He has rhonchi in the right lower field and the left lower field. He has no rales.  Abdominal: He exhibits no distension.    Musculoskeletal: He exhibits no edema.  Neurological: He is alert and oriented to person, place, and time. He exhibits normal muscle tone.  Skin: Skin is warm. Capillary refill takes less than 2 seconds.  Psychiatric: He has a normal mood and affect.  Nursing note and vitals reviewed.    ED Treatments / Results  Labs (all labs ordered are listed, but only abnormal results are displayed) Labs Reviewed  CBC WITH DIFFERENTIAL/PLATELET - Abnormal; Notable for the following components:      Result Value   Monocytes Absolute 1.1 (*)    All other components within normal limits  BASIC METABOLIC PANEL - Abnormal; Notable for the following components:   Chloride 97 (*)    Glucose, Bld 106 (*)    Creatinine, Ser <0.30 (*)    All other components within normal limits  I-STAT CG4 LACTIC ACID, ED    EKG  EKG Interpretation None       Radiology Dg Chest 2 View  Result Date: 12/05/2017 CLINICAL DATA:  Initial evaluation for acute cough, fever, shortness of breath. EXAM: CHEST  2 VIEW COMPARISON:  Prior radiograph from 12/01/2017. FINDINGS: Cardiac and mediastinal silhouettes are stable in size and contour, and remain within normal limits. Lungs are hypoinflated. Patchy opacity at the medial left lung base, somewhat suspicious for infiltrate. No other focal airspace disease. No pulmonary edema or pleural effusion. No pneumothorax. No acute osseus abnormality. Few scattered prominent air-filled loops of bowel noted within the visualized left upper quadrant. IMPRESSION: Shallow lung inflation with patchy left lower lobe opacity, concerning for possible infiltrate given the history of fever, cough. Electronically Signed   By: Jeannine Boga M.D.   On: 12/05/2017 00:34    Procedures Procedures (including critical care time)  Medications Ordered in ED Medications  vancomycin (VANCOCIN) IVPB 750 mg/150 ml premix (750 mg Intravenous New Bag/Given 12/05/17 0134)  sodium chloride 0.9 % bolus  1,000 mL (0 mLs Intravenous Stopped 12/05/17 0111)  piperacillin-tazobactam (ZOSYN) IVPB 3.375 g (0 g Intravenous Stopped 12/05/17 0131)  sodium chloride HYPERTONIC 3 % nebulizer solution 4 mL (4 mLs Nebulization Given 12/05/17 0153)     Initial Impression / Assessment and Plan / ED Course  I have reviewed the triage vital signs and the nursing notes.  Pertinent labs & imaging results that were available during my care of the patient were reviewed by me and considered in my medical decision making (see chart for details).     22 year old male here with worsening shortness of breath after recent discharge for pneumonia.  Patient with significant increased work of breathing, tachypnea, and rhonchi on exam.  Lab work is reassuring.  Chest x-ray now showing left basilar pneumonia, which seems worse from his recent chest x-ray.  Although his labs are reassuring, given his tachycardia, work of breathing, and concern for ongoing aspiration, will readmit.  ADDENDUM: Patient now appears significantly improved after further monitoring  in the ED.  His heart rate has come down.  He is satting well on room air.  Dr. Tamala Julian of the hospitalist service has seen and evaluated the patient.  He does not feel the patient needs admission.  Family and patient are in agreement with this plan.  Will start on Mucinex, advised pulmonary toilet at home and I have written for a flutter valve here.  Good return precautions given.  He will follow-up with speech language pathology as an outpatient.  ADDENDUM: On further discussion with the patient, he does not feel comfortable returning home as he has no home health in place.  Father confirms this.  Unsure whether this was addressed during his recent admission.  He does appear to be back to his baseline breathing status, but given his comorbidities, recent hospitalization, with concern for high risk of return to the hospital if he does not receive good home care, particularly with  encouragement of pulmonary toilet, will consult case management and social work to make sure this is arranged.  I notified the hospitalist, who reaffirms that patient will not be admitted.  Final Clinical Impressions(s) / ED Diagnoses   Final diagnoses:  HCAP (healthcare-associated pneumonia)    ED Discharge Orders        Ordered    guaiFENesin (MUCINEX) 600 MG 12 hr tablet  2 times daily     12/05/17 0204       Duffy Bruce, MD 12/05/17 9563    Duffy Bruce, MD 12/05/17 Ernestine Mcmurray    Duffy Bruce, MD 12/05/17 0400

## 2017-12-04 NOTE — Progress Notes (Signed)
  Speech Language Pathology Treatment: Dysphagia  Patient Details Name: Peter Hayes MRN: [id number] DOB: Mar 24, 1996 Today's Date: 12/04/2017 Time: 1000-1010 SLP Time Calculation (min) (ACUTE ONLY): 10 min  Assessment / Plan / Recommendation Clinical Impression  Per pt and RN, swallow continues to be at baseline. Pt reports no difficulty with po intake. SLP reiterated option for Modified Barium Swallow for objective assessment of swallow function and safety, as well as to rule out silent aspiration, if the need arises. This may be completed on an outpatient basis. No further ST intervention is recommended at this time. Please reconsult if needed.    HPI HPI: 22 year old male with history of muscular dystrophy, bedbound, Jehovah's Witness, ileus who was diagnosed with influenza this past week and started on Tamiflu but stopped because of dizziness presented with worsening shortness of breath and congestion.Dx'd with pna; MD with concerns for aspiration.  Followed by SLP services July of 2017 with dx of mild oral, but no pharyngeal dysphagia.  Recent chest CT revealed "tree-in-bud opacities in both lung bases and within the posterior right upper lobe are likely secondary to aspiration."      SLP Plan  All goals met;Discharge SLP treatment due to (comment)       Recommendations  Diet recommendations: Regular;Thin liquid Medication Administration: Whole meds with liquid Compensations: Minimize environmental distractions;Small sips/bites;Slow rate Postural Changes and/or Swallow Maneuvers: Seated upright 90 degrees                Oral Care Recommendations: Oral care BID Follow up Recommendations: None SLP Visit Diagnosis: Dysphagia, unspecified (R13.10) Plan: All goals met;Discharge SLP treatment due to (comment)       GO               Peter Hayes, CCC-SLP Speech Language Pathologist [phone]  Shonna Chock 12/04/2017, 10:10 AM

## 2017-12-04 NOTE — ED Triage Notes (Signed)
Pt complains of a productive cough for about one week and intermittent fevers

## 2017-12-04 NOTE — Progress Notes (Signed)
Pharmacy Antibiotic Note  Phineas Mcenroe is a 22 y.o. male admitted on 12/01/2017 with SOB. Pharmacy has been consulted for vancomycin and cefepime dosing for PNA.  Patient completed Tamiflu for influenza A.  Today, 12/04/2017 Day #4 cefepime - afebrile - WBC WNL  Plan: Cefepime 1gm IV Q8H for pneumonia - f/u ability to change to PO antibotic, consider ceftin $RemoveBeforeD'500mg'KUvOmLFGxDraIb$  BID of vantin $RemoveB'200mg'ezPWjRBO$  BID   Weight: 79 lb 1.6 oz (35.9 kg)  Temp (24hrs), Avg:98.3 F (36.8 C), Min:97.7 F (36.5 C), Max:98.7 F (37.1 C)  Recent Labs  Lab 12/01/17 1245 12/01/17 1326 12/02/17 2144 12/03/17 0605 12/03/17 0623 12/04/17 0607  WBC  --  6.4  --   --  4.8 5.5  CREATININE  --  <0.30*  --  <0.30*  --  <0.30*  LATICACIDVEN 1.13  --   --   --   --   --   VANCOTROUGH  --   --  37* 7*  --   --     CrCl cannot be calculated (This lab value cannot be used to calculate CrCl because it is not a number: <0.30).    No Known Allergies  Antimicrobials this admission:  2/24 vanco >> 2/26 2/24 cefepime>>  Dose adjustments this admission:  2/25 dose given at 21:00 and trough drawn at 21:44 = 37 mcg/mL (drawn during infusion) 2/26 VT=7, ~ 9 hour level on $Remove'500mg'CQkWEwy$  Q8h  Microbiology results:  2/24 BCx: NGTD  Doreene Eland, PharmD, BCPS.   Pager: [phone] 12/04/2017 11:37 AM

## 2017-12-04 NOTE — ED Triage Notes (Signed)
Pt was just discharged from the hospital with pneumonia, pt states that he doesn't feel any different and is coughing worse at home then he was here. Pt wants to be evaluated again

## 2017-12-04 NOTE — Discharge Summary (Signed)
Physician Discharge Summary  Patient ID: Peter Hayes MRN: [id number] DOB/AGE: [birth year] 22 y.o.  Admit date: 12/01/2017 Discharge date: 12/04/2017  Admission Diagnoses: Principal Problem:   HCAP (healthcare-associated pneumonia) Active Problems:   Muscular dystrophy   Influenza A   Acute respiratory failure with hypoxia [facility])   Discharge Diagnoses:  Principal Problem:   HCAP (healthcare-associated pneumonia) Active Problems:   Muscular dystrophy   Discharged Condition: good  Hospital Course:  22 year old male with history of muscular dystrophy, bedbound, Jehovah's Witness, ileus who was diagnosed with influenza this past week and started on Tamiflu but stopped because of dizziness presented with worsening shortness of breath and congestion.  He was started on intravenous antibiotics for pneumonia.  Problems: Healthcare associated pneumonia with concern for aspiration.  CT showed tree-in-bud pattern in both bases and in RUL c/w aspiration.  CXR was clear.  Blood cx was negative x 1.  Pt improved gradually w/ 3 days IV abx (vanc/ cefipime) and on HD4 was no longer coughing, tachycardic or toxic appearing.  Will dc home on 4 days po cefdinir to complete a 7 day course.    Hypoxia probably secondary to above -weaned off of O2 needs  Influenza A probable bronchitis -Could not tolerate Tamiflu as an outpatient because of side effects. Started po pred 40 mg daily will inpatient.  DC'd at time of dc home.   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  At baseline at time of dc.     DVT prophylaxis: Lovenox Code Status: Full Family Communication: Spoke to mother and sister at time of dc   Consultants: None  Procedures: None  Antimicrobials:  Cefepime and vancomycin from 12/01/2017 > 12/04/17   Discharge Exam: Blood pressure 106/65, pulse 92, temperature 98.1 F (36.7 C), temperature source Oral, resp.  rate 17, weight 35.9 kg (79 lb 1.6 oz), SpO2 98 %. General exam: Appears calm and comfortable  Respiratory system: Bilateral decreased breath sound at bases with scattered crackles Cardiovascular system: S1 & S2 heard, rate controlled  gastrointestinal system: Abdomen is nondistended, soft and nontender. Normal bowel sounds heard. Extremities: No cyanosis, clubbing, edema.  Muscle wasting present      Disposition: 01-Home or Self Care   Allergies as of 12/04/2017   No Known Allergies     Medication List    STOP taking these medications   oseltamivir 75 MG capsule Commonly known as:  TAMIFLU     TAKE these medications   cefdinir 300 MG capsule Commonly known as:  OMNICEF Take 1 capsule (300 mg total) by mouth 2 (two) times daily. For Pneumonia   CoQ10 100 MG Caps Take 100 mg by mouth daily after breakfast.   Selenium 100 MCG Tabs Take 100 mcg by mouth daily after breakfast.   VITAMIN D PO Take 2 tablets by mouth daily after breakfast.        Signed: Sol Blazing 12/04/2017, 4:04 PM

## 2017-12-05 DIAGNOSIS — J189 Pneumonia, unspecified organism: Secondary | ICD-10-CM

## 2017-12-05 LAB — CBC WITH DIFFERENTIAL/PLATELET
Basophils Absolute: 0 10*3/uL (ref 0.0–0.1)
Basophils Relative: 0 %
EOS PCT: 0 %
Eosinophils Absolute: 0 10*3/uL (ref 0.0–0.7)
HCT: 43.4 % (ref 39.0–52.0)
HEMOGLOBIN: 15.5 g/dL (ref 13.0–17.0)
LYMPHS ABS: 0.9 10*3/uL (ref 0.7–4.0)
LYMPHS PCT: 11 %
MCH: 33.5 pg (ref 26.0–34.0)
MCHC: 35.7 g/dL (ref 30.0–36.0)
MCV: 93.9 fL (ref 78.0–100.0)
Monocytes Absolute: 1.1 10*3/uL — ABNORMAL HIGH (ref 0.1–1.0)
Monocytes Relative: 14 %
NEUTROS PCT: 75 %
Neutro Abs: 6.1 10*3/uL (ref 1.7–7.7)
Platelets: 216 10*3/uL (ref 150–400)
RBC: 4.62 MIL/uL (ref 4.22–5.81)
RDW: 13.4 % (ref 11.5–15.5)
WBC: 8.1 10*3/uL (ref 4.0–10.5)

## 2017-12-05 LAB — BASIC METABOLIC PANEL
ANION GAP: 8 (ref 5–15)
BUN: 8 mg/dL (ref 6–20)
CO2: 32 mmol/L (ref 22–32)
Calcium: 9 mg/dL (ref 8.9–10.3)
Chloride: 97 mmol/L — ABNORMAL LOW (ref 101–111)
Creatinine, Ser: <0.3 mg/dL — ABNORMAL LOW (ref 0.61–1.24)
GLUCOSE: 106 mg/dL — AB (ref 65–99)
POTASSIUM: 5 mmol/L (ref 3.5–5.1)
Sodium: 137 mmol/L (ref 135–145)

## 2017-12-05 MED ORDER — COQ10 100 MG PO CAPS: 100.0000 mg | ORAL_CAPSULE | Freq: Every day | ORAL | Status: DC

## 2017-12-05 MED ORDER — SELENIUM 100 MCG PO TABS: 100.0000 ug | ORAL_TABLET | Freq: Every day | ORAL | Status: DC

## 2017-12-05 MED ORDER — IPRATROPIUM-ALBUTEROL 0.5-2.5 (3) MG/3ML IN SOLN
3.0000 mL | Freq: Once | RESPIRATORY_TRACT | Status: AC
  Administered 2017-12-05: 3 mL via RESPIRATORY_TRACT
  Filled 2017-12-05: qty 3

## 2017-12-05 MED ORDER — PIPERACILLIN-TAZOBACTAM 3.375 G IVPB 30 MIN
3.3750 g | Freq: Once | INTRAVENOUS | Status: AC
  Administered 2017-12-05: 3.375 g via INTRAVENOUS
  Filled 2017-12-05: qty 50

## 2017-12-05 MED ORDER — VANCOMYCIN HCL IN DEXTROSE 750-5 MG/150ML-% IV SOLN
750.0000 mg | Freq: Once | INTRAVENOUS | Status: AC
  Administered 2017-12-05: 750 mg via INTRAVENOUS
  Filled 2017-12-05: qty 150

## 2017-12-05 MED ORDER — CEFDINIR 125 MG/5ML PO SUSR
300.0000 mg | Freq: Two times a day (BID) | ORAL | Status: DC
  Administered 2017-12-05: 300 mg via ORAL
  Filled 2017-12-05: qty 15

## 2017-12-05 MED ORDER — GUAIFENESIN ER 600 MG PO TB12: 1200.0000 mg | ORAL_TABLET | Freq: Two times a day (BID) | ORAL | 0 refills | Status: AC

## 2017-12-05 MED ORDER — SODIUM CHLORIDE 3 % IN NEBU
4.0000 mL | INHALATION_SOLUTION | Freq: Once | RESPIRATORY_TRACT | Status: AC
  Administered 2017-12-05: 4 mL via RESPIRATORY_TRACT
  Filled 2017-12-05: qty 4

## 2017-12-05 NOTE — Discharge Instructions (Addendum)
Start taking Mucinex to help thin your secretions  Start the antibiotic as prescribed at discharge  Try to use the flutter valve as often as possible, at least 8-10 times daily

## 2017-12-05 NOTE — Progress Notes (Addendum)
Peter Hayes  is a 22 y.o. male with medical history significant of muscular dystrophy, Jehovah's Witness, and chronic sinus tachycardia; who presents with complaints of cough and shortness of breath.  He reports feeling mucus stuck in his throat and complains of subjective fever.  Patient was just hospitalized from 2/17-2/19 for ileus and diagnosis of influenza A completing partial course of Tamiflu due to side effects.  Then readmitted to the hospital 2/24-2/27 for HCAP where CT revealed a tree-in-bud opacities in both lung bases and posterior right upper lobe thought secondary to aspiration.  Patient was treated with antibiotics of cefepime and vancomycin times 3 days and discharged home to complete 4 days of cefdinir.  Chest x-ray reveals left-sided opacity which could likely correlates with previous CT scan.  Vital signs show tachycardia in the 100-120 with O2 saturations 89-98% on room air.  Examination revealed grossly clear lungs and mild tachycardia.  Previous echocardiogram showing preserved ejection fraction from 04/2016 and EP consult recommended no further evaluation at that time.  Suspect symptoms likely related to congestion from known HCAP versus possible aspiration event.  Patient can continue previously prescribed antibiotics at last discharge.  Recommend Mucinex to help with secretions. Recommend outpatient speech therapy eval with modified barium swallow as patient currently eating a regular diet to ensure no silent aspiration events.   Norval Morton MD [facility] Pager [phone]   If 7PM-7AM, please contact night-coverage [email or web address] Password [email or web address]  12/05/2017, 2:02 AM

## 2017-12-05 NOTE — ED Notes (Addendum)
Patient O2 saturation dropped to 87%. This RN went in room patient came back up to 92%.  Patient and family still aware that waiting for Case management consult.  Denies needing anything at this time.

## 2017-12-05 NOTE — Progress Notes (Addendum)
A consult was received from an ED physician for Vancomycin per pharmacy dosing.  The patient's profile has been reviewed for ht/wt/allergies/indication/available labs.   A one time order has been placed for Vancomycin $RemoveBefore'750mg'dJvIXAtBOcYoy$  iv x1.  Further antibiotics/pharmacy consults should be ordered by admitting physician if indicated.                       Thank you, Nani Skillern Crowford 12/05/2017  12:56 AM

## 2017-12-05 NOTE — ED Notes (Signed)
Spoke with [employer]er, [employer]. She reports that are needing Case management, so she cleared them her services.

## 2017-12-05 NOTE — Care Management Note (Signed)
Case Management Note  Patient Details  Name: Peter Hayes MRN: [id number] Date of Birth: [birth year]  CM consulted for [facility] services on pt.  Spoke with pt and family at bedside to confirm pt was aware of the plan for [facility].  CM discussed if pt felt comfortable going home at this time and if he still felt better today compared to when he came into the hospital a few days ago.  Pt stated he did.  When CM asked why he came back to the hospital last night pt replied he started coughing and felt like he couldn't clear mucus from his throat.  CM offered [place] choice but also advised that some times there are insurance and staffing barriers.  Pt chose [facility] but also understood they may not be able to take him.  CM also advised him that though the doctor ordered [facility] RT they would likely not be coming to see him since he doesn't have a trach and is not a chronic ventilator pt.  [facility] was not able to accept pt due to staffing.  CM updated pt prior to him leaving that CM was unsure of who would be the [facility] agency but that someone would be in touch with him to make [facility] arrangements. Updated primary RN and Dr. Eulis Foster that pt was did not need to stay in the ED while CM tried to find an accepting [facility] agency.  CM contacted all [place] agencies available and all agencies were either not in-network with [employer] or they did not have the available staffing.  CM discussed pt's case with CM/CSW Director requesting the pt placed on the [facility] list.  Director agreed to plan.  Contacted Corey with [facility] and advised him of pt and pt's needs and that he would be HRI. No further CM needs noted at this time.  Expected Discharge Date:   12/05/2017               Expected Discharge Plan:  [place]  In-House Referral:  Clinical Social Work  Discharge planning Services  CM Consult  Post Acute Care Choice:  Home Health Choice offered to:  Patient  HH Arranged:  RN, PT, OT, Nurse's Aide, Speech Therapy, Respirator Therapy, Social Work [employer]  Agency: [facility]  Status of Service:  Completed, signed off  Angelle Isais, Benjaman Lobe, RN 12/05/2017, 10:50 AM

## 2017-12-05 NOTE — ED Notes (Addendum)
Angela CM, states that he should be clear to go home. It is an insurance issue with which Home health agency (either [facility] or [facility]) will accept it and have staff to address patient's needs at home.  States she will be going to talk with Eulis Foster EDP.

## 2017-12-05 NOTE — Progress Notes (Signed)
RT did flutter valve with patient. Patient tolerated well.

## 2017-12-06 LAB — CULTURE, BLOOD (ROUTINE X 2)
CULTURE: NO GROWTH
Special Requests: ADEQUATE

## 2018-01-09 ENCOUNTER — Inpatient Hospital Stay (HOSPITAL_BASED_OUTPATIENT_CLINIC_OR_DEPARTMENT_OTHER)
Admission: EM | Admit: 2018-01-09 | Discharge: 2018-01-15 | DRG: 389 | Disposition: A | Discharge status: Home / Self Care | Payer: Managed Care, Other (non HMO) | Attending: General Surgery | Admitting: General Surgery

## 2018-01-09 ENCOUNTER — Other Ambulatory Visit: Payer: Self-pay

## 2018-01-09 ENCOUNTER — Encounter (HOSPITAL_BASED_OUTPATIENT_CLINIC_OR_DEPARTMENT_OTHER): Payer: Self-pay | Admitting: *Deleted

## 2018-01-09 ENCOUNTER — Emergency Department (HOSPITAL_BASED_OUTPATIENT_CLINIC_OR_DEPARTMENT_OTHER): Payer: Managed Care, Other (non HMO)

## 2018-01-09 DIAGNOSIS — E875 Hyperkalemia: Secondary | ICD-10-CM | POA: Diagnosis not present

## 2018-01-09 DIAGNOSIS — K5909 Other constipation: Secondary | ICD-10-CM | POA: Diagnosis present

## 2018-01-09 DIAGNOSIS — I959 Hypotension, unspecified: Secondary | ICD-10-CM | POA: Diagnosis not present

## 2018-01-09 DIAGNOSIS — E46 Unspecified protein-calorie malnutrition: Secondary | ICD-10-CM | POA: Diagnosis present

## 2018-01-09 DIAGNOSIS — R000 Tachycardia, unspecified: Secondary | ICD-10-CM

## 2018-01-09 DIAGNOSIS — G7101 Duchenne or Becker muscular dystrophy: Secondary | ICD-10-CM | POA: Diagnosis present

## 2018-01-09 DIAGNOSIS — M419 Scoliosis, unspecified: Secondary | ICD-10-CM | POA: Diagnosis present

## 2018-01-09 DIAGNOSIS — K562 Volvulus: Secondary | ICD-10-CM | POA: Diagnosis not present

## 2018-01-09 DIAGNOSIS — Z7401 Bed confinement status: Secondary | ICD-10-CM

## 2018-01-09 DIAGNOSIS — Z681 Body mass index (BMI) 19 or less, adult: Secondary | ICD-10-CM

## 2018-01-09 LAB — CBC WITH DIFFERENTIAL/PLATELET
BASOS PCT: 0 %
Basophils Absolute: 0 10*3/uL (ref 0.0–0.1)
EOS ABS: 0 10*3/uL (ref 0.0–0.7)
EOS PCT: 0 %
HCT: 44.5 % (ref 39.0–52.0)
Hemoglobin: 14.5 g/dL (ref 13.0–17.0)
Lymphocytes Relative: 14 %
Lymphs Abs: 0.9 10*3/uL (ref 0.7–4.0)
MCH: 29.9 pg (ref 26.0–34.0)
MCHC: 32.6 g/dL (ref 30.0–36.0)
MCV: 91.8 fL (ref 78.0–100.0)
MONO ABS: 0.7 10*3/uL (ref 0.1–1.0)
Monocytes Relative: 11 %
Neutro Abs: 4.5 10*3/uL (ref 1.7–7.7)
Neutrophils Relative %: 75 %
PLATELETS: 153 10*3/uL (ref 150–400)
RBC: 4.85 MIL/uL (ref 4.22–5.81)
RDW: 14.1 % (ref 11.5–15.5)
WBC: 6 10*3/uL (ref 4.0–10.5)

## 2018-01-09 LAB — URINALYSIS, ROUTINE W REFLEX MICROSCOPIC
Bilirubin Urine: NEGATIVE
GLUCOSE, UA: NEGATIVE mg/dL
HGB URINE DIPSTICK: NEGATIVE
KETONES UR: 15 mg/dL — AB
Leukocytes, UA: NEGATIVE
Nitrite: NEGATIVE
PROTEIN: NEGATIVE mg/dL
Specific Gravity, Urine: 1.015 (ref 1.005–1.030)
pH: 8.5 — ABNORMAL HIGH (ref 5.0–8.0)

## 2018-01-09 MED ORDER — SODIUM CHLORIDE 0.9 % IV SOLN
Freq: Once | INTRAVENOUS | Status: AC
  Administered 2018-01-09: 23:00:00 via INTRAVENOUS

## 2018-01-09 MED ORDER — IOPAMIDOL (ISOVUE-300) INJECTION 61%
100.0000 mL | Freq: Once | INTRAVENOUS | Status: AC | PRN
  Administered 2018-01-10: 100 mL via INTRAVENOUS

## 2018-01-09 MED ORDER — FENTANYL CITRATE (PF) 100 MCG/2ML IJ SOLN
50.0000 ug | Freq: Once | INTRAMUSCULAR | Status: AC
  Administered 2018-01-09: 50 ug via INTRAVENOUS
  Filled 2018-01-09: qty 2

## 2018-01-09 MED ORDER — ONDANSETRON HCL 4 MG/2ML IJ SOLN
4.0000 mg | Freq: Once | INTRAMUSCULAR | Status: AC
  Administered 2018-01-09: 4 mg via INTRAVENOUS
  Filled 2018-01-09: qty 2

## 2018-01-09 NOTE — ED Provider Notes (Signed)
[facility] DEPT [facility] Provider Note: Georgena Spurling, MD, FACEP  CSN: [id number] MRN: [id number] ARRIVAL: 01/09/18 at 2138 ROOM: [place]  Abdominal Pain   HISTORY OF PRESENT ILLNESS  01/09/18 10:59 PM Peter Hayes is a 22 y.o. 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  He is here with abdominal pain that began about noon.  The abdominal pain is located in the suprapubic region.  He describes it as burning in nature.  It waxes and wanes.  At the present time it is not severe but has been severe earlier.  It seems to come in waves.  He describes the pain as feeling like it is deep in his abdomen.  Nothing makes the pain better or worse.  He denies associated fever, chills, nausea, vomiting, diarrhea, dysuria, abdominal distention, shortness of breath or chest pain.  His last bowel movement was yesterday but was less than usual volume.   Past Medical History:  Diagnosis Date  . MD (muscular dystrophy) ([place])   . Refusal of blood product    [name]    Past Surgical History:  Procedure Laterality Date  . EYE SURGERY      No family history on file.  Social History   Tobacco Use  . Smoking status: Never Smoker  . Smokeless tobacco: Never Used  Substance Use Topics  . Alcohol use: No  . Drug use: No    Prior to Admission medications   Medication Sig Start Date End Date Taking? Authorizing Provider  Coenzyme Q10 (COQ10) 100 MG CAPS Take 100 mg by mouth daily after breakfast.    [provider]  Selenium 100 MCG TABS Take 100 mcg by mouth daily after breakfast.    [provider]    Allergies Patient has no known allergies.   REVIEW OF SYSTEMS  Negative except as noted here or in the History of Present Illness.   PHYSICAL EXAMINATION  Initial Vital Signs Blood pressure (!) 133/95, pulse (!) 104, temperature 98 F (36.7 C), temperature source Oral, resp. rate 16, weight 45.4 kg (100 lb), SpO2 95 %.  Examination General:  Underdeveloped male with generalized muscular atrophy; appearance consistent with age of record HENT: normocephalic; atraumatic Eyes: pupils equal, round and reactive to light; extraocular muscles intact Neck: supple Heart: regular rate and rhythm Lungs: clear to auscultation bilaterally Abdomen: soft; nondistended; nontender; no masses or hepatosplenomegaly; bowel sounds present Extremities: Atrophic and underdeveloped; contractures Neurologic: Awake, alert and oriented; limited neurologic exam due to muscular dystrophy Skin: Warm and dry Psychiatric: Flat affect   RESULTS  Summary of this visit's results, reviewed by myself:   EKG Interpretation  Date/Time:    Ventricular Rate:    PR Interval:    QRS Duration:   QT Interval:    QTC Calculation:   R Axis:     Text Interpretation:        Laboratory Studies: Results for orders placed or performed during the hospital encounter of 01/09/18 (from the past 24 hour(s))  CBC with Differential/Platelet     Status: None   Collection Time: 01/09/18 11:15 PM  Result Value Ref Range   WBC 6.0 4.0 - 10.5 K/uL   RBC 4.85 4.22 - 5.81 MIL/uL   Hemoglobin 14.5 13.0 - 17.0 g/dL   HCT 44.5 39.0 - 52.0 %   MCV 91.8 78.0 - 100.0 fL   MCH 29.9 26.0 - 34.0 pg   MCHC 32.6 30.0 - 36.0 g/dL   RDW 14.1 11.5 -  15.5 %   Platelets 153 150 - 400 K/uL   Neutrophils Relative % 75 %   Neutro Abs 4.5 1.7 - 7.7 K/uL   Lymphocytes Relative 14 %   Lymphs Abs 0.9 0.7 - 4.0 K/uL   Monocytes Relative 11 %   Monocytes Absolute 0.7 0.1 - 1.0 K/uL   Eosinophils Relative 0 %   Eosinophils Absolute 0.0 0.0 - 0.7 K/uL   Basophils Relative 0 %   Basophils Absolute 0.0 0.0 - 0.1 K/uL  Comprehensive metabolic panel     Status: Abnormal   Collection Time: 01/09/18 11:15 PM  Result Value Ref Range   Sodium 135 135 - 145 mmol/L   Potassium 3.8 3.5 - 5.1 mmol/L   Chloride 99 (L) 101 - 111 mmol/L   CO2 27 22 - 32 mmol/L   Glucose, Bld 125 (H) 65 - 99 mg/dL    BUN 6 6 - 20 mg/dL   Creatinine, Ser <0.30 (L) 0.61 - 1.24 mg/dL   Calcium 8.6 (L) 8.9 - 10.3 mg/dL   Total Protein 7.1 6.5 - 8.1 g/dL   Albumin 4.3 3.5 - 5.0 g/dL   AST 35 15 - 41 U/L   ALT 25 17 - 63 U/L   Alkaline Phosphatase 59 38 - 126 U/L   Total Bilirubin 0.7 0.3 - 1.2 mg/dL   GFR calc non Af Amer NOT CALCULATED >60 mL/min   GFR calc Af Amer NOT CALCULATED >60 mL/min   Anion gap 9 5 - 15  Lipase, blood     Status: None   Collection Time: 01/09/18 11:15 PM  Result Value Ref Range   Lipase 23 11 - 51 U/L  Urinalysis, Routine w reflex microscopic     Status: Abnormal   Collection Time: 01/09/18 11:30 PM  Result Value Ref Range   Color, Urine YELLOW YELLOW   APPearance CLOUDY (A) CLEAR   Specific Gravity, Urine 1.015 1.005 - 1.030   pH 8.5 (H) 5.0 - 8.0   Glucose, UA NEGATIVE NEGATIVE mg/dL   Hgb urine dipstick NEGATIVE NEGATIVE   Bilirubin Urine NEGATIVE NEGATIVE   Ketones, ur 15 (A) NEGATIVE mg/dL   Protein, ur NEGATIVE NEGATIVE mg/dL   Nitrite NEGATIVE NEGATIVE   Leukocytes, UA NEGATIVE NEGATIVE   Imaging Studies: Ct Abdomen Pelvis W Contrast  Result Date: 01/10/2018 CLINICAL DATA:  Acute abdominal pain. History of muscular dystrophy. EXAM: CT ABDOMEN AND PELVIS WITH CONTRAST TECHNIQUE: Multidetector CT imaging of the abdomen and pelvis was performed using the standard protocol following bolus administration of intravenous contrast. CONTRAST:  158mL ISOVUE-300 IOPAMIDOL (ISOVUE-300) INJECTION 61% COMPARISON:  CT abdomen pelvis 09/23/2017 FINDINGS: LOWER CHEST: No basilar pulmonary nodules or pleural effusion. No apical pericardial effusion. HEPATOBILIARY: Normal hepatic contours and density. No intra- or extrahepatic biliary dilatation. Normal gallbladder. PANCREAS: Normal parenchymal contours without ductal dilatation. No peripancreatic fluid collection. SPLEEN: Normal. ADRENALS/URINARY TRACT: --Adrenal glands: Normal. --Right kidney/ureter: No hydronephrosis,  nephroureterolithiasis, perinephric stranding or solid renal mass. --Left kidney/ureter: No hydronephrosis, nephroureterolithiasis, perinephric stranding or solid renal mass. --Urinary bladder: Moderate wall thickening. STOMACH/BOWEL: --Stomach/Duodenum: No hiatal hernia or other gastric abnormality. Normal duodenal course. --Small bowel: No dilatation or inflammation. --Colon: There is gaseous dilatation of the transverse colon. No colonic inflammation. --Appendix: Not visualized. No right lower quadrant inflammation or free fluid. VASCULAR/LYMPHATIC: There is swirling of the mesenteric vessels, most notable at the SMV, just below the porta splenic confluence (series 2 images 38-40. There is a second area of mesenteric vascular swirling in  the midline low anterior abdomen. These findings are new from the studies of 09/23/2017 and 05/05/2016. No abdominal or pelvic lymphadenopathy. REPRODUCTIVE: No free fluid in the pelvis. MUSCULOSKELETAL. Diffuse severe muscular atrophy is compatible with reported muscular dystrophy. 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 OTHER: None. IMPRESSION: 1. Swirling of the mesenteric vessels surrounding the proximal superior mesenteric vein and within the inferior anterior midline abdomen. This appearance is concerning for intermittent volvulus. There is no evidence of bowel ischemia or obstruction. 2. Dilated and gas-filled transverse colon without evidence of colonic obstruction. 3. Diffuse muscular atrophy, consistent with muscular dystrophy. 4. Moderate thickening of the urinary bladder may indicate chronic obstruction, neurogenic bladder or cystitis. Electronically Signed   By: Ulyses Jarred M.D.   On: 01/10/2018 01:34    ED COURSE  Nursing notes and initial vitals signs, including pulse oximetry, reviewed.  Vitals:   01/10/18 0045 01/10/18 0100 01/10/18 0115 01/10/18 0130  BP:      Pulse: (!) 115 (!) 120 (!) 116 (!) 131  Resp:      Temp:      TempSrc:      SpO2: 100% 98%  99% 99%  Weight:       2:00 AM Patient continues to have pain despite multiple doses of fentanyl.  His abdomen remains soft.  CT scan is suspicious for an intermittent volvulus.  I have spoken with Dr. Barry Dienes of [employer] surgery.  She will see the patient in the [facility] emergency department.  [facility] has been dispatched for transfer.  Dr. Roxanne Mins is the accepting EDP.  PROCEDURES   CRITICAL CARE Performed by: Shanon Rosser L Total critical care time: 35 minutes Critical care time was exclusive of separately billable procedures and treating other patients. Critical care was necessary to treat or prevent imminent or life-threatening deterioration. Critical care was time spent personally by me on the following activities: development of treatment plan with patient and/or surrogate as well as nursing, discussions with consultants, evaluation of patient's response to treatment, examination of patient, obtaining history from patient or surrogate, ordering and performing treatments and interventions, ordering and review of laboratory studies, ordering and review of radiographic studies, pulse oximetry and re-evaluation of patient's condition.   ED DIAGNOSES     ICD-10-CM   1. 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

## 2018-01-09 NOTE — ED Notes (Signed)
Family at bedside. 

## 2018-01-09 NOTE — ED Triage Notes (Signed)
Abdominal pain at noon today. Pain comes and goes.

## 2018-01-10 ENCOUNTER — Encounter (HOSPITAL_COMMUNITY): Admission: EM | Disposition: A | Discharge status: Home / Self Care | Payer: Self-pay | Source: Home / Self Care

## 2018-01-10 ENCOUNTER — Inpatient Hospital Stay (HOSPITAL_COMMUNITY): Payer: Managed Care, Other (non HMO) | Admitting: Certified Registered Nurse Anesthetist

## 2018-01-10 ENCOUNTER — Encounter (HOSPITAL_COMMUNITY): Payer: Self-pay | Admitting: Certified Registered Nurse Anesthetist

## 2018-01-10 DIAGNOSIS — G7100 Muscular dystrophy, unspecified: Secondary | ICD-10-CM | POA: Diagnosis not present

## 2018-01-10 DIAGNOSIS — K562 Volvulus: Secondary | ICD-10-CM | POA: Diagnosis present

## 2018-01-10 DIAGNOSIS — Z7401 Bed confinement status: Secondary | ICD-10-CM | POA: Diagnosis not present

## 2018-01-10 DIAGNOSIS — R000 Tachycardia, unspecified: Secondary | ICD-10-CM | POA: Diagnosis present

## 2018-01-10 DIAGNOSIS — Z681 Body mass index (BMI) 19 or less, adult: Secondary | ICD-10-CM | POA: Diagnosis not present

## 2018-01-10 DIAGNOSIS — E46 Unspecified protein-calorie malnutrition: Secondary | ICD-10-CM | POA: Diagnosis present

## 2018-01-10 DIAGNOSIS — I959 Hypotension, unspecified: Secondary | ICD-10-CM | POA: Diagnosis not present

## 2018-01-10 DIAGNOSIS — K5909 Other constipation: Secondary | ICD-10-CM | POA: Diagnosis present

## 2018-01-10 DIAGNOSIS — M419 Scoliosis, unspecified: Secondary | ICD-10-CM | POA: Diagnosis present

## 2018-01-10 DIAGNOSIS — E875 Hyperkalemia: Secondary | ICD-10-CM | POA: Diagnosis not present

## 2018-01-10 DIAGNOSIS — G7101 Duchenne or Becker muscular dystrophy: Secondary | ICD-10-CM | POA: Diagnosis present

## 2018-01-10 DIAGNOSIS — J9601 Acute respiratory failure with hypoxia: Secondary | ICD-10-CM | POA: Diagnosis not present

## 2018-01-10 HISTORY — PX: FLEXIBLE SIGMOIDOSCOPY: SHX5431

## 2018-01-10 LAB — COMPREHENSIVE METABOLIC PANEL
ALBUMIN: 4.3 g/dL (ref 3.5–5.0)
ALK PHOS: 59 U/L (ref 38–126)
ALT: 25 U/L (ref 17–63)
AST: 35 U/L (ref 15–41)
Anion gap: 9 (ref 5–15)
BILIRUBIN TOTAL: 0.7 mg/dL (ref 0.3–1.2)
BUN: 6 mg/dL (ref 6–20)
CO2: 27 mmol/L (ref 22–32)
Calcium: 8.6 mg/dL — ABNORMAL LOW (ref 8.9–10.3)
Chloride: 99 mmol/L — ABNORMAL LOW (ref 101–111)
GLUCOSE: 125 mg/dL — AB (ref 65–99)
POTASSIUM: 3.8 mmol/L (ref 3.5–5.1)
Sodium: 135 mmol/L (ref 135–145)
TOTAL PROTEIN: 7.1 g/dL (ref 6.5–8.1)

## 2018-01-10 LAB — CBC
HCT: 42.2 % (ref 39.0–52.0)
Hemoglobin: 13 g/dL (ref 13.0–17.0)
MCH: 29.3 pg (ref 26.0–34.0)
MCHC: 30.8 g/dL (ref 30.0–36.0)
MCV: 95 fL (ref 78.0–100.0)
PLATELETS: 151 10*3/uL (ref 150–400)
RBC: 4.44 MIL/uL (ref 4.22–5.81)
RDW: 14 % (ref 11.5–15.5)
WBC: 5.4 10*3/uL (ref 4.0–10.5)

## 2018-01-10 LAB — CREATININE, SERUM: Creatinine, Ser: <0.3 mg/dL — ABNORMAL LOW (ref 0.61–1.24)

## 2018-01-10 LAB — LACTIC ACID, PLASMA: Lactic Acid, Venous: 0.4 mmol/L — ABNORMAL LOW (ref 0.5–1.9)

## 2018-01-10 LAB — LIPASE, BLOOD: LIPASE: 23 U/L (ref 11–51)

## 2018-01-10 SURGERY — SIGMOIDOSCOPY, FLEXIBLE
Anesthesia: Monitor Anesthesia Care

## 2018-01-10 MED ORDER — DIPHENHYDRAMINE HCL 12.5 MG/5ML PO ELIX: 12.5000 mg | ORAL_SOLUTION | Freq: Four times a day (QID) | ORAL | Status: DC | PRN

## 2018-01-10 MED ORDER — ACETAMINOPHEN 650 MG RE SUPP: 650.0000 mg | Freq: Four times a day (QID) | RECTAL | Status: DC | PRN

## 2018-01-10 MED ORDER — FENTANYL CITRATE (PF) 100 MCG/2ML IJ SOLN
50.0000 ug | Freq: Once | INTRAMUSCULAR | Status: AC
  Administered 2018-01-10: 50 ug via INTRAVENOUS
  Filled 2018-01-10: qty 2

## 2018-01-10 MED ORDER — FENTANYL CITRATE (PF) 100 MCG/2ML IJ SOLN
50.0000 ug | INTRAMUSCULAR | Status: DC | PRN
  Administered 2018-01-10 (×2): 50 ug via INTRAVENOUS
  Filled 2018-01-10 (×2): qty 2

## 2018-01-10 MED ORDER — METHOCARBAMOL 500 MG PO TABS: 500.0000 mg | ORAL_TABLET | Freq: Four times a day (QID) | ORAL | Status: DC | PRN

## 2018-01-10 MED ORDER — ONDANSETRON HCL 4 MG/2ML IJ SOLN
4.0000 mg | Freq: Four times a day (QID) | INTRAMUSCULAR | Status: DC | PRN
  Administered 2018-01-10: 4 mg via INTRAVENOUS
  Filled 2018-01-10: qty 2

## 2018-01-10 MED ORDER — ONDANSETRON 4 MG PO TBDP: 4.0000 mg | ORAL_TABLET | Freq: Four times a day (QID) | ORAL | Status: DC | PRN

## 2018-01-10 MED ORDER — KCL IN DEXTROSE-NACL 40-5-0.45 MEQ/L-%-% IV SOLN
INTRAVENOUS | Status: DC
  Administered 2018-01-10 – 2018-01-11 (×3): via INTRAVENOUS
  Filled 2018-01-10 (×4): qty 1000

## 2018-01-10 MED ORDER — ACETAMINOPHEN 325 MG PO TABS: 650.0000 mg | ORAL_TABLET | Freq: Four times a day (QID) | ORAL | Status: DC | PRN

## 2018-01-10 MED ORDER — POLYETHYLENE GLYCOL 3350 17 G PO PACK
17.0000 g | PACK | Freq: Every day | ORAL | Status: DC
  Administered 2018-01-10 – 2018-01-15 (×5): 17 g via ORAL
  Filled 2018-01-10 (×5): qty 1

## 2018-01-10 MED ORDER — METOPROLOL SUCCINATE ER 25 MG PO TB24
12.5000 mg | ORAL_TABLET | Freq: Every day | ORAL | Status: DC
  Administered 2018-01-10 – 2018-01-12 (×3): 12.5 mg via ORAL
  Filled 2018-01-10 (×3): qty 1

## 2018-01-10 MED ORDER — LACTATED RINGERS IV SOLN
INTRAVENOUS | Status: DC
  Administered 2018-01-10: 1000 mL via INTRAVENOUS

## 2018-01-10 MED ORDER — PROPOFOL 10 MG/ML IV BOLUS
INTRAVENOUS | Status: AC
  Filled 2018-01-10: qty 40

## 2018-01-10 MED ORDER — KCL IN DEXTROSE-NACL 20-5-0.45 MEQ/L-%-% IV SOLN
INTRAVENOUS | Status: DC
  Administered 2018-01-10: 07:00:00 via INTRAVENOUS
  Filled 2018-01-10: qty 1000

## 2018-01-10 MED ORDER — HYDROMORPHONE HCL 1 MG/ML IJ SOLN
0.5000 mg | INTRAMUSCULAR | Status: DC | PRN
  Administered 2018-01-10 – 2018-01-11 (×4): 1 mg via INTRAVENOUS
  Filled 2018-01-10 (×5): qty 1

## 2018-01-10 MED ORDER — DIPHENHYDRAMINE HCL 50 MG/ML IJ SOLN: 12.5000 mg | Freq: Four times a day (QID) | INTRAMUSCULAR | Status: DC | PRN

## 2018-01-10 MED ORDER — ENOXAPARIN SODIUM 30 MG/0.3ML ~~LOC~~ SOLN
30.0000 mg | SUBCUTANEOUS | Status: DC
  Administered 2018-01-12 – 2018-01-15 (×4): 30 mg via SUBCUTANEOUS
  Filled 2018-01-10 (×5): qty 0.3

## 2018-01-10 MED ORDER — PROPOFOL 10 MG/ML IV BOLUS
INTRAVENOUS | Status: DC | PRN
  Administered 2018-01-10: 20 mg via INTRAVENOUS

## 2018-01-10 MED ORDER — PROPOFOL 500 MG/50ML IV EMUL
INTRAVENOUS | Status: DC | PRN
  Administered 2018-01-10: 100 ug/kg/min via INTRAVENOUS

## 2018-01-10 MED ORDER — ZOLPIDEM TARTRATE 5 MG PO TABS: 5.0000 mg | ORAL_TABLET | Freq: Every evening | ORAL | Status: DC | PRN

## 2018-01-10 MED ORDER — SIMETHICONE 80 MG PO CHEW: 40.0000 mg | CHEWABLE_TABLET | Freq: Four times a day (QID) | ORAL | Status: DC | PRN

## 2018-01-10 MED ORDER — SODIUM CHLORIDE 0.9 % IV SOLN: INTRAVENOUS | Status: DC

## 2018-01-10 NOTE — H&P (Signed)
Peter Hayes is an 22 y.o. male.   Chief Complaint: Abdominl pain HPI:  Pt is a 22 yo M with muscular dystrophy who presents to the ED with intermittent severe abdominal pain.  The pain has been occurring for around 18 hours with waves of worsening and lessening.  It is principally in the lower abdomen.  He denies nausea/vomiting/fever/chills.  He has not had any urinary symptoms.  He denies constipation, but his dad thinks he did not have a BM yesterday.  He has had similar symptoms in the past year, but they resolved on their own.  He feels a little better with rubbing of his abdomen.  He is not passing gas at this point, and thinks it was over 24 hours since he did that.  He is accompanied by his parents and sister.    Past Medical History:  Diagnosis Date  . MD (muscular dystrophy) ([place])   . Refusal of blood product    [name]    Past Surgical History:  Procedure Laterality Date  . EYE SURGERY      No family history on file. Social History:  reports that he has never smoked. He has never used smokeless tobacco. He reports that he does not drink alcohol or use drugs.  Allergies: No Known Allergies  Meds: No outpatient medications have been marked as taking for the 01/09/18 encounter [facility] Encounter).     Results for orders placed or performed during the hospital encounter of 01/09/18 (from the past 48 hour(s))  CBC with Differential/Platelet     Status: None   Collection Time: 01/09/18 11:15 PM  Result Value Ref Range   WBC 6.0 4.0 - 10.5 K/uL   RBC 4.85 4.22 - 5.81 MIL/uL   Hemoglobin 14.5 13.0 - 17.0 g/dL   HCT 44.5 39.0 - 52.0 %   MCV 91.8 78.0 - 100.0 fL   MCH 29.9 26.0 - 34.0 pg   MCHC 32.6 30.0 - 36.0 g/dL   RDW 14.1 11.5 - 15.5 %   Platelets 153 150 - 400 K/uL   Neutrophils Relative % 75 %   Neutro Abs 4.5 1.7 - 7.7 K/uL   Lymphocytes Relative 14 %   Lymphs Abs 0.9 0.7 - 4.0 K/uL   Monocytes Relative 11 %   Monocytes Absolute 0.7 0.1 - 1.0  K/uL   Eosinophils Relative 0 %   Eosinophils Absolute 0.0 0.0 - 0.7 K/uL   Basophils Relative 0 %   Basophils Absolute 0.0 0.0 - 0.1 K/uL    Comment: Performed at [facility], [address] [place], [place] [address]  Comprehensive metabolic panel     Status: Abnormal   Collection Time: 01/09/18 11:15 PM  Result Value Ref Range   Sodium 135 135 - 145 mmol/L   Potassium 3.8 3.5 - 5.1 mmol/L   Chloride 99 (L) 101 - 111 mmol/L   CO2 27 22 - 32 mmol/L   Glucose, Bld 125 (H) 65 - 99 mg/dL   BUN 6 6 - 20 mg/dL   Creatinine, Ser <0.30 (L) 0.61 - 1.24 mg/dL   Calcium 8.6 (L) 8.9 - 10.3 mg/dL   Total Protein 7.1 6.5 - 8.1 g/dL   Albumin 4.3 3.5 - 5.0 g/dL   AST 35 15 - 41 U/L   ALT 25 17 - 63 U/L   Alkaline Phosphatase 59 38 - 126 U/L   Total Bilirubin 0.7 0.3 - 1.2 mg/dL   GFR calc non Af  Amer NOT CALCULATED >60 mL/min   GFR calc Af Amer NOT CALCULATED >60 mL/min    Comment: (NOTE) The eGFR has been calculated using the CKD EPI equation. This calculation has not been validated in all clinical situations. eGFR's persistently <60 mL/min signify possible Chronic Kidney Disease.    Anion gap 9 5 - 15    Comment: Performed at [facility], [address] [place], [place] [address]  Lipase, blood     Status: None   Collection Time: 01/09/18 11:15 PM  Result Value Ref Range   Lipase 23 11 - 51 U/L    Comment: Performed at [facility], [address] [place], [place] [address]  Urinalysis, Routine w reflex microscopic     Status: Abnormal   Collection Time: 01/09/18 11:30 PM  Result Value Ref Range   Color, Urine YELLOW YELLOW   APPearance CLOUDY (A) CLEAR   Specific Gravity, Urine 1.015 1.005 - 1.030   pH 8.5 (H) 5.0 - 8.0   Glucose, UA NEGATIVE NEGATIVE mg/dL   Hgb urine dipstick NEGATIVE NEGATIVE   Bilirubin Urine NEGATIVE NEGATIVE   Ketones, ur 15 (A) NEGATIVE mg/dL   Protein, ur NEGATIVE NEGATIVE mg/dL   Nitrite NEGATIVE NEGATIVE    Leukocytes, UA NEGATIVE NEGATIVE    Comment: Microscopic not done on urines with negative protein, blood, leukocytes, nitrite, or glucose < 500 mg/dL. Performed at [facility], [address] [place], [place] [address]    Ct Abdomen Pelvis W Contrast  Result Date: 01/10/2018 CLINICAL DATA:  Acute abdominal pain. History of muscular dystrophy. EXAM: CT ABDOMEN AND PELVIS WITH CONTRAST TECHNIQUE: Multidetector CT imaging of the abdomen and pelvis was performed using the standard protocol following bolus administration of intravenous contrast. CONTRAST:  179m ISOVUE-300 IOPAMIDOL (ISOVUE-300) INJECTION 61% COMPARISON:  CT abdomen pelvis 09/23/2017 FINDINGS: LOWER CHEST: No basilar pulmonary nodules or pleural effusion. No apical pericardial effusion. HEPATOBILIARY: Normal hepatic contours and density. No intra- or extrahepatic biliary dilatation. Normal gallbladder. PANCREAS: Normal parenchymal contours without ductal dilatation. No peripancreatic fluid collection. SPLEEN: Normal. ADRENALS/URINARY TRACT: --Adrenal glands: Normal. --Right kidney/ureter: No hydronephrosis, nephroureterolithiasis, perinephric stranding or solid renal mass. --Left kidney/ureter: No hydronephrosis, nephroureterolithiasis, perinephric stranding or solid renal mass. --Urinary bladder: Moderate wall thickening. STOMACH/BOWEL: --Stomach/Duodenum: No hiatal hernia or other gastric abnormality. Normal duodenal course. --Small bowel: No dilatation or inflammation. --Colon: There is gaseous dilatation of the transverse colon. No colonic inflammation. --Appendix: Not visualized. No right lower quadrant inflammation or free fluid. VASCULAR/LYMPHATIC: There is swirling of the mesenteric vessels, most notable at the SMV, just below the porta splenic confluence (series 2 images 38-40. There is a second area of mesenteric vascular swirling in the midline low anterior abdomen. These findings are new from the studies of 09/23/2017 and  05/05/2016. No abdominal or pelvic lymphadenopathy. REPRODUCTIVE: No free fluid in the pelvis. MUSCULOSKELETAL. Diffuse severe muscular atrophy is compatible with reported muscular dystrophy. 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 OTHER: None. IMPRESSION: 1. Swirling of the mesenteric vessels surrounding the proximal superior mesenteric vein and within the inferior anterior midline abdomen. This appearance is concerning for intermittent volvulus. There is no evidence of bowel ischemia or obstruction. 2. Dilated and gas-filled transverse colon without evidence of colonic obstruction. 3. Diffuse muscular atrophy, consistent with muscular dystrophy. 4. Moderate thickening of the urinary bladder may indicate chronic obstruction, neurogenic bladder or cystitis. Electronically Signed   By: KUlyses JarredM.D.   On: 01/10/2018 01:34  Review of Systems  Constitutional: Negative.   HENT: Negative.   Eyes: Negative.   Respiratory: Negative.   Cardiovascular: Negative.   Gastrointestinal: Positive for abdominal pain and constipation. Negative for blood in stool.  Genitourinary: Negative.   Musculoskeletal:       Muscular dystrophy.  Skin: Negative.   Neurological:       Muscular dystrophy  Endo/Heme/Allergies: Negative.   Psychiatric/Behavioral: Negative.     Blood pressure (!) 127/99, pulse (!) 115, temperature 98.1 F (36.7 C), temperature source Oral, resp. rate 18, weight 45.4 kg (100 lb), SpO2 100 %. Physical Exam  Constitutional: He is oriented to person, place, and time.  Very thin, looks moderately distressed.  Very thin.  HENT:  Head: Normocephalic and atraumatic.  Right Ear: External ear normal.  Left Ear: External ear normal.  Mouth/Throat: Oropharynx is clear and moist.  Eyes: Pupils are equal, round, and reactive to light. Conjunctivae are normal. Right eye exhibits no discharge. Left eye exhibits no discharge. No scleral icterus.  Neck: Neck supple. No tracheal deviation present. No  thyromegaly present.  Cardiovascular: Normal rate, regular rhythm and intact distal pulses.  Respiratory: Effort normal. No respiratory distress. He exhibits no tenderness.  GI: Soft. He exhibits distension. He exhibits no mass. Tenderness: mildly tender LLQ. There is no rebound and no guarding.  Musculoskeletal: He exhibits deformity. He exhibits no edema or tenderness.   Gross underdevelopment and contractures of lower extremities.  Significant scoliosis.  Neurological: He is alert and oriented to person, place, and time. Coordination (poor moter BLE) abnormal.  Skin: Skin is warm and dry. No rash noted. He is not diaphoretic. No erythema. No pallor.  Psychiatric: He has a normal mood and affect. His behavior is normal. Judgment and thought content normal.     Assessment/Plan Abdominal pain CT findings concerning for volvulus with swirling of mesentery.  This looks most consistent with sigmoid volvulus. The patient does not appear to have malrotation of the duodenum and has not had surgery to create a lead point for a volvulus or internal hernia. The sigmoid looks quite dilated and anterior to the transverse colon. Reviewed options with patient and family.  I recommend eventual surgery as I think this is likely to recur with decompression.  However, decompression can potentially allow bowel cleanout.   Will discuss with daytime team.   Family considering options. Reviewed surgery with patient and family including risks of infection and additional procedures/surgeries.  Risk of leak.   Will need pain control, IV fluid resuscitation. Will check lactate.       Stark Klein, MD 01/10/2018, 5:54 AM

## 2018-01-10 NOTE — Anesthesia Preprocedure Evaluation (Signed)
Anesthesia Evaluation  Patient identified by MRN, date of birth, ID band Patient awake    Reviewed: Allergy & Precautions, H&P , NPO status , Patient's Chart, lab work & pertinent test results  Airway Mallampati: II   Neck ROM: full    Dental   Pulmonary    breath sounds clear to auscultation       Cardiovascular negative cardio ROS   Rhythm:regular Rate:Normal     Neuro/Psych  Neuromuscular disease    GI/Hepatic volvulous   Endo/Other    Renal/GU      Musculoskeletal Muscular dystrophy   Abdominal   Peds  Hematology  (+) [facility]  Anesthesia Other Findings   Reproductive/Obstetrics                             Anesthesia Physical Anesthesia Plan  ASA: II  Anesthesia Plan: MAC   Post-op Pain Management:    Induction: Intravenous  PONV Risk Score and Plan: 1 and Propofol infusion and Treatment may vary due to age or medical condition  Airway Management Planned: Nasal Cannula  Additional Equipment:   Intra-op Plan:   Post-operative Plan:   Informed Consent: I have reviewed the patients History and Physical, chart, labs and discussed the procedure including the risks, benefits and alternatives for the proposed anesthesia with the patient or authorized representative who has indicated his/her understanding and acceptance.     Plan Discussed with: CRNA, Anesthesiologist and Surgeon  Anesthesia Plan Comments:         Anesthesia Quick Evaluation

## 2018-01-10 NOTE — Op Note (Signed)
[facility] Patient Name: Thane Age Procedure Date: 01/10/2018 MRN: [id number] Attending MD: Nancy Fetter Dr., MD Date of Birth: Jul 30, 1996 CSN: [id number] Age: 22 Admit Type: Inpatient Procedure:                Flexible Sigmoidoscopy Indications:              Suspected volvulus, For therapy of volvulus.CT's                            showed markedly dilated: an unsuspected volvulus.                            The patient has muscular dystrophy in his bed by                            now. Providers:                Joyice Faster. Rolonda Pontarelli Dr., MD, Cleda Daub, RN, Cletis Athens, Technician Referring MD:             CCS Medicines:                Monitored Anesthesia Care Complications:            No immediate complications. Estimated Blood Loss:     Estimated blood loss: none. Procedure:                Pre-Anesthesia Assessment:                           - Prior to the procedure, a History and Physical                            was performed, and patient medications and                            allergies were reviewed. The patient's tolerance of                            previous anesthesia was also reviewed. The risks                            and benefits of the procedure and the sedation                            options and risks were discussed with the patient.                            All questions were answered, and informed consent                            was obtained. Prior Anticoagulants: The patient has                            taken  no previous anticoagulant or antiplatelet                            agents. ASA Grade Assessment: II - A patient with                            mild systemic disease. After reviewing the risks                            and benefits, the patient was deemed in                            satisfactory condition to undergo the procedure.                           After obtaining informed  consent, the scope was                            passed under direct vision. The [id number] ([id number])                            scope was introduced through the anus and advanced                            to the 80 cm from the anal verge. The flexible                            sigmoidoscopy was accomplished without difficulty.                            The patient tolerated the procedure well. The                            quality of the bowel preparation was good. Findings:      The perianal and digital rectal examinations were normal.      The lumen of the recto-sigmoid colon, sigmoid colon, descending colon       and transverse colon was significantly dilated. The colon was sleeved       over the scope and decompressed. We did see solid stool were unable to       advance beyond that felt to be in the transverse colon. After the scope       was remove the abdomen was much flatter. I then inserted 24 [place]       rectal tube and taped it to the patient's buttocks. Impression:               - Dilated in the recto-sigmoid colon, in the                            sigmoid colon, in the descending colon and in the                            transverse colon.                           -  No specimens collected.                           - sigmoid volvulus decompressed, rectal tube                            inserted Moderate Sedation:      MAC by anesthesia Recommendation:           - we will put rectal tube to low suction. Would try                            to keep his potassium 4.5 to 5.0. Will start on ice                            chips and can advanced clear liquids and bowel prep                            if he improves.                           - Return patient to hospital ward for ongoing care. Procedure Code(s):        --- Professional ---                           [phone], Sigmoidoscopy, flexible; diagnostic,                            including collection of specimen(s) by  brushing or                            washing, when performed (separate procedure) Diagnosis Code(s):        --- Professional ---                           K59.39, Other megacolon                           K56.2, Volvulus CPT copyright 2017 [employer]. All rights reserved. The codes documented in this report are preliminary and upon coder review may  be revised to meet current compliance requirements. Nancy Fetter Dr., MD 01/10/2018 11:33:13 AM This report has been signed electronically. Number of Addenda: 0

## 2018-01-10 NOTE — Progress Notes (Signed)
Metoprolol 12.'5mg'$  daily added for sinus tach.  EKG reveals sinus tach and family states his heart rate is very fast at home at baseline.  He may need to see cardiology as an outpatient for further management or may just defer to comfort of PCP.  Henreitta Cea 2:46 PM 01/10/2018

## 2018-01-10 NOTE — ED Notes (Signed)
Pain improved after Fentanyl. Pt transported via [facility] to [facility] via Biomedical scientist in Stable Condition.

## 2018-01-10 NOTE — ED Notes (Signed)
ED TO INPATIENT HANDOFF REPORT  Name/Age/Gender Peter Hayes 22 y.o. male  Code Status    Code Status Orders  (From admission, onward)        Start     Ordered   01/10/18 0614  Full code  Continuous     01/10/18 0613    Code Status History    Date Active Date Inactive Code Status Order ID Comments User Context   12/01/2017 2110 12/04/2017 2005 Full Code [id number]  Etta Quill, DO Inpatient   09/24/2017 0237 09/25/2017 2117 Full Code [id number]  Vianne Bulls, MD Inpatient   05/05/2016 0715 05/11/2016 1837 Full Code [id number]  Maren Reamer, MD Inpatient      Home/SNF/Other Home  Chief Complaint ABDOMINAL PAIN  Level of Care/Admitting Diagnosis ED Disposition    ED Disposition Condition [place]: [facility] [100102]  Level of Care: Med-Surg [16]  Diagnosis: Sigmoid volvulus [facility]) [[id number]]  Admitting Physician: CCS, [place]  Attending Physician: CCS, MD [[id number]]  Estimated length of stay: 3 - 4 days  Certification:: I certify this patient will need inpatient services for at least 2 midnights  PT Class (Do Not Modify): Inpatient [101]  PT Acc Code (Do Not Modify): Private [1]       Medical History Past Medical History:  Diagnosis Date  . MD (muscular dystrophy) (Circle)   . Refusal of blood product    [name]    Allergies No Known Allergies  IV Location/Drains/Wounds Patient Lines/Drains/Airways Status   Active Line/Drains/Airways    Name:   Placement date:   Placement time:   Site:   Days:   Peripheral IV 01/09/18 Right Hand   01/09/18    2320    Hand   1          Labs/Imaging Results for orders placed or performed during the hospital encounter of 01/09/18 (from the past 48 hour(s))  CBC with Differential/Platelet     Status: None   Collection Time: 01/09/18 11:15 PM  Result Value Ref Range   WBC 6.0 4.0 - 10.5 K/uL   RBC 4.85 4.22 - 5.81 MIL/uL   Hemoglobin 14.5 13.0 - 17.0 g/dL    HCT 44.5 39.0 - 52.0 %   MCV 91.8 78.0 - 100.0 fL   MCH 29.9 26.0 - 34.0 pg   MCHC 32.6 30.0 - 36.0 g/dL   RDW 14.1 11.5 - 15.5 %   Platelets 153 150 - 400 K/uL   Neutrophils Relative % 75 %   Neutro Abs 4.5 1.7 - 7.7 K/uL   Lymphocytes Relative 14 %   Lymphs Abs 0.9 0.7 - 4.0 K/uL   Monocytes Relative 11 %   Monocytes Absolute 0.7 0.1 - 1.0 K/uL   Eosinophils Relative 0 %   Eosinophils Absolute 0.0 0.0 - 0.7 K/uL   Basophils Relative 0 %   Basophils Absolute 0.0 0.0 - 0.1 K/uL    Comment: Performed at [facility], [address] [place], [place] [address]  Comprehensive metabolic panel     Status: Abnormal   Collection Time: 01/09/18 11:15 PM  Result Value Ref Range   Sodium 135 135 - 145 mmol/L   Potassium 3.8 3.5 - 5.1 mmol/L   Chloride 99 (L) 101 - 111 mmol/L   CO2 27 22 - 32 mmol/L   Glucose, Bld 125 (H) 65 - 99 mg/dL   BUN 6 6 - 20 mg/dL  Creatinine, Ser <0.30 (L) 0.61 - 1.24 mg/dL   Calcium 8.6 (L) 8.9 - 10.3 mg/dL   Total Protein 7.1 6.5 - 8.1 g/dL   Albumin 4.3 3.5 - 5.0 g/dL   AST 35 15 - 41 U/L   ALT 25 17 - 63 U/L   Alkaline Phosphatase 59 38 - 126 U/L   Total Bilirubin 0.7 0.3 - 1.2 mg/dL   GFR calc non Af Amer NOT CALCULATED >60 mL/min   GFR calc Af Amer NOT CALCULATED >60 mL/min    Comment: (NOTE) The eGFR has been calculated using the CKD EPI equation. This calculation has not been validated in all clinical situations. eGFR's persistently <60 mL/min signify possible Chronic Kidney Disease.    Anion gap 9 5 - 15    Comment: Performed at [facility], [address] [place], [place] [address]  Lipase, blood     Status: None   Collection Time: 01/09/18 11:15 PM  Result Value Ref Range   Lipase 23 11 - 51 U/L    Comment: Performed at [facility], [address] [place], [place] [address]  Urinalysis, Routine w reflex microscopic     Status: Abnormal   Collection Time: 01/09/18 11:30 PM  Result Value Ref Range    Color, Urine YELLOW YELLOW   APPearance CLOUDY (A) CLEAR   Specific Gravity, Urine 1.015 1.005 - 1.030   pH 8.5 (H) 5.0 - 8.0   Glucose, UA NEGATIVE NEGATIVE mg/dL   Hgb urine dipstick NEGATIVE NEGATIVE   Bilirubin Urine NEGATIVE NEGATIVE   Ketones, ur 15 (A) NEGATIVE mg/dL   Protein, ur NEGATIVE NEGATIVE mg/dL   Nitrite NEGATIVE NEGATIVE   Leukocytes, UA NEGATIVE NEGATIVE    Comment: Microscopic not done on urines with negative protein, blood, leukocytes, nitrite, or glucose < 500 mg/dL. Performed at [facility], [address] [place], [place] [address]    Ct Abdomen Pelvis W Contrast  Result Date: 01/10/2018 CLINICAL DATA:  Acute abdominal pain. History of muscular dystrophy. EXAM: CT ABDOMEN AND PELVIS WITH CONTRAST TECHNIQUE: Multidetector CT imaging of the abdomen and pelvis was performed using the standard protocol following bolus administration of intravenous contrast. CONTRAST:  144m ISOVUE-300 IOPAMIDOL (ISOVUE-300) INJECTION 61% COMPARISON:  CT abdomen pelvis 09/23/2017 FINDINGS: LOWER CHEST: No basilar pulmonary nodules or pleural effusion. No apical pericardial effusion. HEPATOBILIARY: Normal hepatic contours and density. No intra- or extrahepatic biliary dilatation. Normal gallbladder. PANCREAS: Normal parenchymal contours without ductal dilatation. No peripancreatic fluid collection. SPLEEN: Normal. ADRENALS/URINARY TRACT: --Adrenal glands: Normal. --Right kidney/ureter: No hydronephrosis, nephroureterolithiasis, perinephric stranding or solid renal mass. --Left kidney/ureter: No hydronephrosis, nephroureterolithiasis, perinephric stranding or solid renal mass. --Urinary bladder: Moderate wall thickening. STOMACH/BOWEL: --Stomach/Duodenum: No hiatal hernia or other gastric abnormality. Normal duodenal course. --Small bowel: No dilatation or inflammation. --Colon: There is gaseous dilatation of the transverse colon. No colonic inflammation. --Appendix: Not visualized. No  right lower quadrant inflammation or free fluid. VASCULAR/LYMPHATIC: There is swirling of the mesenteric vessels, most notable at the SMV, just below the porta splenic confluence (series 2 images 38-40. There is a second area of mesenteric vascular swirling in the midline low anterior abdomen. These findings are new from the studies of 09/23/2017 and 05/05/2016. No abdominal or pelvic lymphadenopathy. REPRODUCTIVE: No free fluid in the pelvis. MUSCULOSKELETAL. Diffuse severe muscular atrophy is compatible with reported muscular dystrophy. 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 OTHER: None. IMPRESSION: 1. Swirling of the mesenteric vessels surrounding the proximal superior mesenteric  vein and within the inferior anterior midline abdomen. This appearance is concerning for intermittent volvulus. There is no evidence of bowel ischemia or obstruction. 2. Dilated and gas-filled transverse colon without evidence of colonic obstruction. 3. Diffuse muscular atrophy, consistent with muscular dystrophy. 4. Moderate thickening of the urinary bladder may indicate chronic obstruction, neurogenic bladder or cystitis. Electronically Signed   By: Ulyses Jarred M.D.   On: 01/10/2018 01:34    Pending Labs Unresulted Labs (From admission, onward)   Start     Ordered   01/17/18 0500  Creatinine, serum  (enoxaparin (LOVENOX)    CrCl >/= 30 ml/min)  Weekly,   R    Comments:  while on enoxaparin therapy    01/10/18 0613   01/11/18 3295  Basic metabolic panel  Tomorrow morning,   R     01/10/18 0613   01/11/18 0500  CBC  Tomorrow morning,   R     01/10/18 0613   01/10/18 0614  CBC  (enoxaparin (LOVENOX)    CrCl >/= 30 ml/min)  Once,   R    Comments:  Baseline for enoxaparin therapy IF NOT ALREADY DRAWN.  Notify MD if PLT < 100 K.    01/10/18 1884   01/10/18 0614  Creatinine, serum  (enoxaparin (LOVENOX)    CrCl >/= 30 ml/min)  Once,   R    Comments:  Baseline for enoxaparin therapy IF NOT ALREADY DRAWN.    01/10/18 0613    01/10/18 0524  Lactic acid, plasma  Add-on,   R     01/10/18 0523      Vitals/Pain Today's Vitals   01/10/18 0202 01/10/18 0209 01/10/18 0215 01/10/18 0230  BP: (!) 127/99     Pulse: (!) 116  (!) 115   Resp: 18     Temp: 98.1 F (36.7 C)     TempSrc: Oral     SpO2: 100%  100%   Weight:      PainSc:  2   5     Isolation Precautions No active isolations  Medications Medications  enoxaparin (LOVENOX) injection 40 mg (has no administration in time range)  acetaminophen (TYLENOL) tablet 650 mg (has no administration in time range)    Or  acetaminophen (TYLENOL) suppository 650 mg (has no administration in time range)  HYDROmorphone (DILAUDID) injection 0.5-1 mg (1 mg Intravenous Given 01/10/18 1660)  methocarbamol (ROBAXIN) tablet 500 mg (has no administration in time range)  zolpidem (AMBIEN) tablet 5 mg (has no administration in time range)  diphenhydrAMINE (BENADRYL) 12.5 MG/5ML elixir 12.5 mg (has no administration in time range)    Or  diphenhydrAMINE (BENADRYL) injection 12.5 mg (has no administration in time range)  ondansetron (ZOFRAN-ODT) disintegrating tablet 4 mg ( Oral See Alternative 01/10/18 6301)    Or  ondansetron (ZOFRAN) injection 4 mg (4 mg Intravenous Given 01/10/18 6010)  simethicone (MYLICON) chewable tablet 40 mg (has no administration in time range)  0.9 %  sodium chloride infusion ( Intravenous Stopped 01/10/18 0206)  ondansetron (ZOFRAN) injection 4 mg (4 mg Intravenous Given 01/09/18 2324)  fentaNYL (SUBLIMAZE) injection 50 mcg (50 mcg Intravenous Given 01/09/18 2323)  iopamidol (ISOVUE-300) 61 % injection 100 mL (100 mLs Intravenous Contrast Given 01/10/18 0003)  fentaNYL (SUBLIMAZE) injection 50 mcg (50 mcg Intravenous Given 01/10/18 0033)  fentaNYL (SUBLIMAZE) injection 50 mcg (50 mcg Intravenous Given 01/10/18 0421)    Mobility walks with device

## 2018-01-10 NOTE — ED Notes (Signed)
Dr Roxanne Mins accepting ED MD at [facility], [facility] informed

## 2018-01-10 NOTE — Anesthesia Postprocedure Evaluation (Signed)
Anesthesia Post Note  Patient: Peter Hayes  Procedure(s) Performed: FLEXIBLE SIGMOIDOSCOPY (N/A )     Patient location during evaluation: Endoscopy Anesthesia Type: MAC Level of consciousness: awake and alert Pain management: pain level controlled Vital Signs Assessment: post-procedure vital signs reviewed and stable Respiratory status: spontaneous breathing, nonlabored ventilation, respiratory function stable and patient connected to nasal cannula oxygen Cardiovascular status: blood pressure returned to baseline and stable Postop Assessment: no apparent nausea or vomiting Anesthetic complications: no    Last Vitals:  Vitals:   01/10/18 1200 01/10/18 1223  BP: (!) 146/75 131/65  Pulse: (!) 134 (!) 134  Resp: 13 12  Temp:    SpO2: 100% 99%    Last Pain:  Vitals:   01/10/18 1223  TempSrc:   PainSc: 0-No pain                 Aarik Blank S

## 2018-01-10 NOTE — ED Notes (Signed)
Pt received 50 mcg of fentanyl prior to leaving [facility]

## 2018-01-10 NOTE — Interval H&P Note (Signed)
History and Physical Interval Note:  01/10/2018 10:50 AM  Peter Hayes  has presented today for surgery, with the diagnosis of volvulus  The various methods of treatment have been discussed with the patient and family. After consideration of risks, benefits and other options for treatment, the patient has consented to  Procedure(s): FLEXIBLE SIGMOIDOSCOPY (N/A) as a surgical intervention .  The patient's history has been reviewed, patient examined, no change in status, stable for surgery.  I have reviewed the patient's chart and labs.  Questions were answered to the patient's satisfaction.     Nancy Fetter

## 2018-01-10 NOTE — ED Notes (Signed)
Assumed care of patient from [place], [place]. Pt resting quietly. Awaiting CT report. No distress. VSS. Family at side.

## 2018-01-10 NOTE — ED Notes (Signed)
Report given to [employer], pt ready for transport

## 2018-01-10 NOTE — Consult Note (Signed)
[facility] CON[facility] Reason for consult: Sigmoid volvulus Referring Physician: CCS  Peter Hayes is an 21 y.o. male.  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  It is always present but the intensity has been changing for the past 24 hours.  He tends to be constipated and has not had bowel movement or passed there in the past 24 hours.  CT scan showed sigmoid volvulus with twisting of the mesentery.Other than the muscular dystrophy he really does not have any other significant health problems.  He is a Jehovah's Witness.Labs remarkable for potassium 3.8 normal LFTs and normal WBC.  Hemoglobin is normal at 13.0.  Past Medical History:  Diagnosis Date  . MD (muscular dystrophy) (HCC)   . Refusal of blood product    patient is jehovah witness    Past Surgical History:  Procedure Laterality Date  . EYE SURGERY      No family history on file.  Social History:  reports that he has never smoked. He has never used smokeless tobacco. He reports that he does not drink alcohol or use drugs.  Allergies: No Known Allergies  Medications; Prior to Admission medications   Not on File   . [START ON 01/11/2018] enoxaparin (LOVENOX) injection  30 mg Subcutaneous Q24H   PRN Meds acetaminophen **OR** acetaminophen, diphenhydrAMINE **OR** diphenhydrAMINE, HYDROmorphone (DILAUDID) injection, methocarbamol, ondansetron **OR** ondansetron (ZOFRAN) IV, simethicone, zolpidem Results for orders placed or performed during the hospital encounter of 01/09/18 (from the past 48 hour(s))  CBC with Differential/Platelet     Status: None   Collection Time: 01/09/18 11:15 PM  Result Value Ref Range   WBC 6.0 4.0 - 10.5 K/uL   RBC 4.85 4.22 - 5.81 MIL/uL   Hemoglobin 14.5 13.0 - 17.0 g/dL   HCT 44.5 39.0 - 52.0 %   MCV 91.8 78.0 - 100.0 fL   MCH 29.9 26.0 - 34.0  pg   MCHC 32.6 30.0 - 36.0 g/dL   RDW 14.1 11.5 - 15.5 %   Platelets 153 150 - 400 K/uL   Neutrophils Relative % 75 %   Neutro Abs 4.5 1.7 - 7.7 K/uL   Lymphocytes Relative 14 %   Lymphs Abs 0.9 0.7 - 4.0 K/uL   Monocytes Relative 11 %   Monocytes Absolute 0.7 0.1 - 1.0 K/uL   Eosinophils Relative 0 %   Eosinophils Absolute 0.0 0.0 - 0.7 K/uL   Basophils Relative 0 %   Basophils Absolute 0.0 0.0 - 0.1 K/uL    Comment: Performed at [facility] [place], [address] [place],  [address]  Comprehensive metabolic panel     Status: Abnormal   Collection Time: 01/09/18 11:15 PM  Result Value Ref Range   Sodium 135 135 - 145 mmol/L   Potassium 3.8 3.5 - 5.1 mmol/L   Chloride 99 (L) 101 - 111 mmol/L   CO2 27 22 - 32 mmol/L   Glucose, Bld 125 (H) 65 - 99 mg/dL   BUN 6 6 - 20 mg/dL   Creatinine, Ser <0.30 (L) 0.61 - 1.24 mg/dL   Calcium 8.6 (L) 8.9 - 10.3 mg/dL   Total Protein 7.1 6.5 - 8.1 g/dL   Albumin 4.3 3.5 - 5.0 g/dL   AST 35 15 - 41 U/L   ALT 25 17 - 63 U/L   Alkaline Phosphatase 59 38 - 126   U/L   Total Bilirubin 0.7 0.3 - 1.2 mg/dL   GFR calc non Af Amer NOT CALCULATED >60 mL/min   GFR calc Af Amer NOT CALCULATED >60 mL/min    Comment: (NOTE) The eGFR has been calculated using the CKD EPI equation. This calculation has not been validated in all clinical situations. eGFR's persistently <60 mL/min signify possible Chronic Kidney Disease.    Anion gap 9 5 - 15    Comment: Performed at [facility], [address] [place], [place] [address]  Lipase, blood     Status: None   Collection Time: 01/09/18 11:15 PM  Result Value Ref Range   Lipase 23 11 - 51 U/L    Comment: Performed at [facility], [address] [place], [place] [address]  Urinalysis, Routine w reflex microscopic     Status: Abnormal   Collection Time: 01/09/18 11:30 PM  Result Value Ref Range   Color, Urine YELLOW YELLOW   APPearance CLOUDY (A) CLEAR   Specific Gravity,  Urine 1.015 1.005 - 1.030   pH 8.5 (H) 5.0 - 8.0   Glucose, UA NEGATIVE NEGATIVE mg/dL   Hgb urine dipstick NEGATIVE NEGATIVE   Bilirubin Urine NEGATIVE NEGATIVE   Ketones, ur 15 (A) NEGATIVE mg/dL   Protein, ur NEGATIVE NEGATIVE mg/dL   Nitrite NEGATIVE NEGATIVE   Leukocytes, UA NEGATIVE NEGATIVE    Comment: Microscopic not done on urines with negative protein, blood, leukocytes, nitrite, or glucose < 500 mg/dL. Performed at [facility], [address] [place], [place] [address]   Lactic acid, plasma     Status: Abnormal   Collection Time: 01/10/18  7:08 AM  Result Value Ref Range   Lactic Acid, Venous 0.4 (L) 0.5 - 1.9 mmol/L    Comment: Performed at [facility], [place] [address] [place], [place] [address]  CBC     Status: None   Collection Time: 01/10/18  7:08 AM  Result Value Ref Range   WBC 5.4 4.0 - 10.5 K/uL   RBC 4.44 4.22 - 5.81 MIL/uL   Hemoglobin 13.0 13.0 - 17.0 g/dL   HCT 42.2 39.0 - 52.0 %   MCV 95.0 78.0 - 100.0 fL   MCH 29.3 26.0 - 34.0 pg   MCHC 30.8 30.0 - 36.0 g/dL   RDW 14.0 11.5 - 15.5 %   Platelets 151 150 - 400 K/uL    Comment: Performed at [facility], [place] [address], [place], [place] [address]  Creatinine, serum     Status: Abnormal   Collection Time: 01/10/18  7:08 AM  Result Value Ref Range   Creatinine, Ser <0.30 (L) 0.61 - 1.24 mg/dL   GFR calc non Af Amer NOT CALCULATED >60 mL/min   GFR calc Af Amer NOT CALCULATED >60 mL/min    Comment: (NOTE) The eGFR has been calculated using the CKD EPI equation. This calculation has not been validated in all clinical situations. eGFR's persistently <60 mL/min signify possible Chronic Kidney Disease. Performed at [facility], [place] [address], [place], [place] [address]     Ct Abdomen Pelvis W Contrast  Result Date: 01/10/2018 CLINICAL DATA:  Acute abdominal pain. History of muscular dystrophy. EXAM: CT ABDOMEN AND PELVIS WITH CONTRAST  TECHNIQUE: Multidetector CT imaging of the abdomen and pelvis was performed using the standard protocol following bolus administration of intravenous contrast. CONTRAST:  130m ISOVUE-300 IOPAMIDOL (ISOVUE-300) INJECTION 61% COMPARISON:  CT abdomen pelvis  09/23/2017 FINDINGS: LOWER CHEST: No basilar pulmonary nodules or pleural effusion. No apical pericardial effusion. HEPATOBILIARY: Normal hepatic contours and density. No intra- or extrahepatic biliary dilatation. Normal gallbladder. PANCREAS: Normal parenchymal contours without ductal dilatation. No peripancreatic fluid collection. SPLEEN: Normal. ADRENALS/URINARY TRACT: --Adrenal glands: Normal. --Right kidney/ureter: No hydronephrosis, nephroureterolithiasis, perinephric stranding or solid renal mass. --Left kidney/ureter: No hydronephrosis, nephroureterolithiasis, perinephric stranding or solid renal mass. --Urinary bladder: Moderate wall thickening. STOMACH/BOWEL: --Stomach/Duodenum: No hiatal hernia or other gastric abnormality. Normal duodenal course. --Small bowel: No dilatation or inflammation. --Colon: There is gaseous dilatation of the transverse colon. No colonic inflammation. --Appendix: Not visualized. No right lower quadrant inflammation or free fluid. VASCULAR/LYMPHATIC: There is swirling of the mesenteric vessels, most notable at the SMV, just below the porta splenic confluence (series 2 images 38-40. There is a second area of mesenteric vascular swirling in the midline low anterior abdomen. These findings are new from the studies of 09/23/2017 and 05/05/2016. No abdominal or pelvic lymphadenopathy. REPRODUCTIVE: No free fluid in the pelvis. MUSCULOSKELETAL. Diffuse severe muscular atrophy is compatible with reported muscular dystrophy. 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 OTHER: None. IMPRESSION: 1. Swirling of the mesenteric vessels surrounding the proximal superior mesenteric vein and within the inferior anterior midline abdomen. This  appearance is concerning for intermittent volvulus. There is no evidence of bowel ischemia or obstruction. 2. Dilated and gas-filled transverse colon without evidence of colonic obstruction. 3. Diffuse muscular atrophy, consistent with muscular dystrophy. 4. Moderate thickening of the urinary bladder may indicate chronic obstruction, neurogenic bladder or cystitis. Electronically Signed   By: Kevin  Herman M.D.   On: 01/10/2018 01:34              Blood pressure (!) 151/42, pulse (!) 134, temperature 98.3 F (36.8 C), temperature source Oral, resp. rate 16, height 5' (1.524 m), weight 33.8 kg (74 lb 8.3 oz), SpO2 100 %.  Physical exam:   General--very thin emaciated male ENT--nonicteric Neck--supple  heart--regular rate and rhythm without murmurs or gallops Lungs--clear Abdomen--slightly distended no bowel sounds tender diffusely of mild to moderate in nature Psych--oriented to person place and time alert answers questions appropriately  Assessment: 1.  Sigmoid volvulus 2.  Chronic constipation 3.  Duchenne's muscular dystrophy y Plan: We have given the patient tap water enemas and will proceed with sigmoidoscopy and decompression of the volvulus this morning.  He is being considered for possible sigmoid colectomy at some point in the future.  I would try to keep his potassium 4.5-5.0 for several days in the hopes that this will improve his colonic motility and he may well need to be on chronic MiraLAX, Linzess etc.   Peter Hayes 01/10/2018, 9:57 AM   This note was created using voice recognition software and minor errors may Have occurred unintentionally. Pager: [phone] If no answer or after hours call [phone]    

## 2018-01-10 NOTE — ED Notes (Signed)
Report given to charge nurse, Terri at [facility] ED

## 2018-01-10 NOTE — ED Notes (Signed)
This Probation officer stuck patient x2 was unsuccessful, RN Anderson Malta made aware.

## 2018-01-10 NOTE — H&P (View-Only) (Signed)
[facility] CONSULT Reason for consult: Sigmoid volvulus Referring Physician: CCS  Peter Hayes is an 22 y.o. male.  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  It is always present but the intensity has been changing for the past 24 hours.  He tends to be constipated and has not had bowel movement or passed there in the past 24 hours.  CT scan showed sigmoid volvulus with twisting of the mesentery.Other than the muscular dystrophy he really does not have any other significant health problems.  He is a Restaurant manager, fast food.Labs remarkable for potassium 3.8 normal LFTs and normal WBC.  Hemoglobin is normal at 13.0.  Past Medical History:  Diagnosis Date  . MD (muscular dystrophy) ([place])   . Refusal of blood product    patient is Peter Hayes witness    Past Surgical History:  Procedure Laterality Date  . EYE SURGERY      No family history on file.  Social History:  reports that he has never smoked. He has never used smokeless tobacco. He reports that he does not drink alcohol or use drugs.  Allergies: No Known Allergies  Medications; Prior to Admission medications   Not on File   . [START ON 01/11/2018] enoxaparin (LOVENOX) injection  30 mg Subcutaneous Q24H   PRN Meds acetaminophen **OR** acetaminophen, diphenhydrAMINE **OR** diphenhydrAMINE, HYDROmorphone (DILAUDID) injection, methocarbamol, ondansetron **OR** ondansetron (ZOFRAN) IV, simethicone, zolpidem Results for orders placed or performed during the hospital encounter of 01/09/18 (from the past 48 hour(s))  CBC with Differential/Platelet     Status: None   Collection Time: 01/09/18 11:15 PM  Result Value Ref Range   WBC 6.0 4.0 - 10.5 K/uL   RBC 4.85 4.22 - 5.81 MIL/uL   Hemoglobin 14.5 13.0 - 17.0 g/dL   HCT 44.5 39.0 - 52.0 %   MCV 91.8 78.0 - 100.0 fL   MCH 29.9 26.0 - 34.0  pg   MCHC 32.6 30.0 - 36.0 g/dL   RDW 14.1 11.5 - 15.5 %   Platelets 153 150 - 400 K/uL   Neutrophils Relative % 75 %   Neutro Abs 4.5 1.7 - 7.7 K/uL   Lymphocytes Relative 14 %   Lymphs Abs 0.9 0.7 - 4.0 K/uL   Monocytes Relative 11 %   Monocytes Absolute 0.7 0.1 - 1.0 K/uL   Eosinophils Relative 0 %   Eosinophils Absolute 0.0 0.0 - 0.7 K/uL   Basophils Relative 0 %   Basophils Absolute 0.0 0.0 - 0.1 K/uL    Comment: Performed at [facility], [address] [place], [place] [address]  Comprehensive metabolic panel     Status: Abnormal   Collection Time: 01/09/18 11:15 PM  Result Value Ref Range   Sodium 135 135 - 145 mmol/L   Potassium 3.8 3.5 - 5.1 mmol/L   Chloride 99 (L) 101 - 111 mmol/L   CO2 27 22 - 32 mmol/L   Glucose, Bld 125 (H) 65 - 99 mg/dL   BUN 6 6 - 20 mg/dL   Creatinine, Ser <0.30 (L) 0.61 - 1.24 mg/dL   Calcium 8.6 (L) 8.9 - 10.3 mg/dL   Total Protein 7.1 6.5 - 8.1 g/dL   Albumin 4.3 3.5 - 5.0 g/dL   AST 35 15 - 41 U/L   ALT 25 17 - 63 U/L   Alkaline Phosphatase 59 38 - 126  U/L   Total Bilirubin 0.7 0.3 - 1.2 mg/dL   GFR calc non Af Amer NOT CALCULATED >60 mL/min   GFR calc Af Amer NOT CALCULATED >60 mL/min    Comment: (NOTE) The eGFR has been calculated using the CKD EPI equation. This calculation has not been validated in all clinical situations. eGFR's persistently <60 mL/min signify possible Chronic Kidney Disease.    Anion gap 9 5 - 15    Comment: Performed at [facility], [address] [place], [place] [address]  Lipase, blood     Status: None   Collection Time: 01/09/18 11:15 PM  Result Value Ref Range   Lipase 23 11 - 51 U/L    Comment: Performed at [facility], [address] [place], [place] [address]  Urinalysis, Routine w reflex microscopic     Status: Abnormal   Collection Time: 01/09/18 11:30 PM  Result Value Ref Range   Color, Urine YELLOW YELLOW   APPearance CLOUDY (A) CLEAR   Specific Gravity,  Urine 1.015 1.005 - 1.030   pH 8.5 (H) 5.0 - 8.0   Glucose, UA NEGATIVE NEGATIVE mg/dL   Hgb urine dipstick NEGATIVE NEGATIVE   Bilirubin Urine NEGATIVE NEGATIVE   Ketones, ur 15 (A) NEGATIVE mg/dL   Protein, ur NEGATIVE NEGATIVE mg/dL   Nitrite NEGATIVE NEGATIVE   Leukocytes, UA NEGATIVE NEGATIVE    Comment: Microscopic not done on urines with negative protein, blood, leukocytes, nitrite, or glucose < 500 mg/dL. Performed at [facility], [address] [place], [place] [address]   Lactic acid, plasma     Status: Abnormal   Collection Time: 01/10/18  7:08 AM  Result Value Ref Range   Lactic Acid, Venous 0.4 (L) 0.5 - 1.9 mmol/L    Comment: Performed at [facility], [place] [address] [place], [place] [address]  CBC     Status: None   Collection Time: 01/10/18  7:08 AM  Result Value Ref Range   WBC 5.4 4.0 - 10.5 K/uL   RBC 4.44 4.22 - 5.81 MIL/uL   Hemoglobin 13.0 13.0 - 17.0 g/dL   HCT 42.2 39.0 - 52.0 %   MCV 95.0 78.0 - 100.0 fL   MCH 29.3 26.0 - 34.0 pg   MCHC 30.8 30.0 - 36.0 g/dL   RDW 14.0 11.5 - 15.5 %   Platelets 151 150 - 400 K/uL    Comment: Performed at [facility], [place] [address], [place], [place] [address]  Creatinine, serum     Status: Abnormal   Collection Time: 01/10/18  7:08 AM  Result Value Ref Range   Creatinine, Ser <0.30 (L) 0.61 - 1.24 mg/dL   GFR calc non Af Amer NOT CALCULATED >60 mL/min   GFR calc Af Amer NOT CALCULATED >60 mL/min    Comment: (NOTE) The eGFR has been calculated using the CKD EPI equation. This calculation has not been validated in all clinical situations. eGFR's persistently <60 mL/min signify possible Chronic Kidney Disease. Performed at [facility], [place] [address], [place], [place] [address]     Ct Abdomen Pelvis W Contrast  Result Date: 01/10/2018 CLINICAL DATA:  Acute abdominal pain. History of muscular dystrophy. EXAM: CT ABDOMEN AND PELVIS WITH CONTRAST  TECHNIQUE: Multidetector CT imaging of the abdomen and pelvis was performed using the standard protocol following bolus administration of intravenous contrast. CONTRAST:  171m ISOVUE-300 IOPAMIDOL (ISOVUE-300) INJECTION 61% COMPARISON:  CT abdomen pelvis  09/23/2017 FINDINGS: LOWER CHEST: No basilar pulmonary nodules or pleural effusion. No apical pericardial effusion. HEPATOBILIARY: Normal hepatic contours and density. No intra- or extrahepatic biliary dilatation. Normal gallbladder. PANCREAS: Normal parenchymal contours without ductal dilatation. No peripancreatic fluid collection. SPLEEN: Normal. ADRENALS/URINARY TRACT: --Adrenal glands: Normal. --Right kidney/ureter: No hydronephrosis, nephroureterolithiasis, perinephric stranding or solid renal mass. --Left kidney/ureter: No hydronephrosis, nephroureterolithiasis, perinephric stranding or solid renal mass. --Urinary bladder: Moderate wall thickening. STOMACH/BOWEL: --Stomach/Duodenum: No hiatal hernia or other gastric abnormality. Normal duodenal course. --Small bowel: No dilatation or inflammation. --Colon: There is gaseous dilatation of the transverse colon. No colonic inflammation. --Appendix: Not visualized. No right lower quadrant inflammation or free fluid. VASCULAR/LYMPHATIC: There is swirling of the mesenteric vessels, most notable at the SMV, just below the porta splenic confluence (series 2 images 38-40. There is a second area of mesenteric vascular swirling in the midline low anterior abdomen. These findings are new from the studies of 09/23/2017 and 05/05/2016. No abdominal or pelvic lymphadenopathy. REPRODUCTIVE: No free fluid in the pelvis. MUSCULOSKELETAL. Diffuse severe muscular atrophy is compatible with reported muscular dystrophy. 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 OTHER: None. IMPRESSION: 1. Swirling of the mesenteric vessels surrounding the proximal superior mesenteric vein and within the inferior anterior midline abdomen. This  appearance is concerning for intermittent volvulus. There is no evidence of bowel ischemia or obstruction. 2. Dilated and gas-filled transverse colon without evidence of colonic obstruction. 3. Diffuse muscular atrophy, consistent with muscular dystrophy. 4. Moderate thickening of the urinary bladder may indicate chronic obstruction, neurogenic bladder or cystitis. Electronically Signed   By: Ulyses Jarred M.D.   On: 01/10/2018 01:34              Blood pressure (!) 151/42, pulse (!) 134, temperature 98.3 F (36.8 C), temperature source Oral, resp. rate 16, height 5' (1.524 m), weight 33.8 kg (74 lb 8.3 oz), SpO2 100 %.  Physical exam:   General--very thin emaciated male ENT--nonicteric Neck--supple  heart--regular rate and rhythm without murmurs or gallops Lungs--clear Abdomen--slightly distended no bowel sounds tender diffusely of mild to moderate in nature Psych--oriented to person place and time alert answers questions appropriately  Assessment: 1.  Sigmoid volvulus 2.  Chronic constipation 3.  Duchenne's muscular dystrophy y Plan: We have given the patient tap water enemas and will proceed with sigmoidoscopy and decompression of the volvulus this morning.  He is being considered for possible sigmoid colectomy at some point in the future.  I would try to keep his potassium 4.5-5.0 for several days in the hopes that this will improve his colonic motility and he may well need to be on chronic MiraLAX, Linzess etc.   Nancy Fetter 01/10/2018, 9:57 AM   This note was created using voice recognition software and minor errors may Have occurred unintentionally. Pager: [phone] If no answer or after hours call [phone]

## 2018-01-10 NOTE — Progress Notes (Signed)
Administered full tap water enema. Administered with no difficulty. Patient retained for 15 minutes. Assisted to [facility]. Patient expelled clear water. MD aware.

## 2018-01-10 NOTE — ED Provider Notes (Signed)
Patient arrived to [facility] ER as transfer from [facility] in stable condition. See initial H&P for full details. Patient with CT suspicious for volvulus. Dr. Barry Dienes of general surgery notified of patient's arrival and will see patient.   General surgery to admit.     French Kendra, Ozella Almond, PA-C 63/78/58 [id number]    Delora Fuel, MD 77/41/28 [phone]

## 2018-01-10 NOTE — Transfer of Care (Signed)
Immediate Anesthesia Transfer of Care Note  Patient: Peter Hayes  Procedure(s) Performed: FLEXIBLE SIGMOIDOSCOPY (N/A )  Patient Location: PACU and Endoscopy Unit  Anesthesia Type:MAC  Level of Consciousness: drowsy  Airway & Oxygen Therapy: Patient Spontanous Breathing and Patient connected to face mask  Post-op Assessment: Report given to RN and Post -op Vital signs reviewed and stable  Post vital signs: Reviewed and stable  Last Vitals:  Vitals Value Taken Time  BP    Temp    Pulse 133 01/10/2018 11:26 AM  Resp    SpO2 100 % 01/10/2018 11:26 AM  Vitals shown include unvalidated device data.  Last Pain:  Vitals:   01/10/18 1024  TempSrc: Oral  PainSc: 2       Patients Stated Pain Goal: 2 (11/19/15 3567)  Complications: No apparent anesthesia complications

## 2018-01-10 NOTE — Progress Notes (Signed)
[facility] Progress Note     Subjective: CC:  No complaints this AM. Mom and sister at bedside. Pain improved compared to admission. Denies flatus or bowel movement.   Objective: Vital signs in last 24 hours: Temp:  [98 F (36.7 C)-98.3 F (36.8 C)] 98.3 F (36.8 C) (04/05 0659) Pulse Rate:  [104-134] 134 (04/05 0659) Resp:  [15-18] 16 (04/05 0659) BP: (107-151)/(42-99) 151/42 (04/05 0659) SpO2:  [95 %-100 %] 100 % (04/05 0659) Weight:  [33.8 kg (74 lb 8.3 oz)-45.4 kg (100 lb)] 33.8 kg (74 lb 8.3 oz) (04/05 0659) Last BM Date: 01/08/18  Intake/Output from previous day: No intake/output data recorded. Intake/Output this shift: No intake/output data recorded.  PE: Gen:  Alert, NAD, pleasant Card:  Regular rate and rhythm, pedal pulses 2+ BL Pulm:  Normal effort, clear to auscultation bilaterally Abd: Soft, mild TTP LLQ, No peritonitis  Skin: warm and dry, no rashes  MSK: muscle wasting of bilateral upper and lower extremities, contractures of BLE.  Psych: A&Ox3   Lab Results:  Recent Labs    01/09/18 2315 01/10/18 0708  WBC 6.0 5.4  HGB 14.5 13.0  HCT 44.5 42.2  PLT 153 151   BMET Recent Labs    01/09/18 2315 01/10/18 0708  NA 135  --   K 3.8  --   CL 99*  --   CO2 27  --   GLUCOSE 125*  --   BUN 6  --   CREATININE <0.30* <0.30*  CALCIUM 8.6*  --    PT/INR No results for input(s): LABPROT, INR in the last 72 hours. CMP     Component Value Date/Time   NA 135 01/09/2018 2315   K 3.8 01/09/2018 2315   CL 99 (L) 01/09/2018 2315   CO2 27 01/09/2018 2315   GLUCOSE 125 (H) 01/09/2018 2315   BUN 6 01/09/2018 2315   CREATININE <0.30 (L) 01/10/2018 0708   CALCIUM 8.6 (L) 01/09/2018 2315   PROT 7.1 01/09/2018 2315   ALBUMIN 4.3 01/09/2018 2315   AST 35 01/09/2018 2315   ALT 25 01/09/2018 2315   ALKPHOS 59 01/09/2018 2315   BILITOT 0.7 01/09/2018 2315   GFRNONAA NOT CALCULATED 01/10/2018 0708   GFRAA NOT CALCULATED 01/10/2018 0708    Lipase     Component Value Date/Time   LIPASE 23 01/09/2018 2315       Studies/Results: Ct Abdomen Pelvis W Contrast  Result Date: 01/10/2018 CLINICAL DATA:  Acute abdominal pain. History of muscular dystrophy. EXAM: CT ABDOMEN AND PELVIS WITH CONTRAST TECHNIQUE: Multidetector CT imaging of the abdomen and pelvis was performed using the standard protocol following bolus administration of intravenous contrast. CONTRAST:  182mL ISOVUE-300 IOPAMIDOL (ISOVUE-300) INJECTION 61% COMPARISON:  CT abdomen pelvis 09/23/2017 FINDINGS: LOWER CHEST: No basilar pulmonary nodules or pleural effusion. No apical pericardial effusion. HEPATOBILIARY: Normal hepatic contours and density. No intra- or extrahepatic biliary dilatation. Normal gallbladder. PANCREAS: Normal parenchymal contours without ductal dilatation. No peripancreatic fluid collection. SPLEEN: Normal. ADRENALS/URINARY TRACT: --Adrenal glands: Normal. --Right kidney/ureter: No hydronephrosis, nephroureterolithiasis, perinephric stranding or solid renal mass. --Left kidney/ureter: No hydronephrosis, nephroureterolithiasis, perinephric stranding or solid renal mass. --Urinary bladder: Moderate wall thickening. STOMACH/BOWEL: --Stomach/Duodenum: No hiatal hernia or other gastric abnormality. Normal duodenal course. --Small bowel: No dilatation or inflammation. --Colon: There is gaseous dilatation of the transverse colon. No colonic inflammation. --Appendix: Not visualized. No right lower quadrant inflammation or free fluid. VASCULAR/LYMPHATIC: There is swirling of the mesenteric vessels, most notable at the SMV,  just below the porta splenic confluence (series 2 images 38-40. There is a second area of mesenteric vascular swirling in the midline low anterior abdomen. These findings are new from the studies of 09/23/2017 and 05/05/2016. No abdominal or pelvic lymphadenopathy. REPRODUCTIVE: No free fluid in the pelvis. MUSCULOSKELETAL. Diffuse severe muscular  atrophy is compatible with reported muscular dystrophy. 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 OTHER: None. IMPRESSION: 1. Swirling of the mesenteric vessels surrounding the proximal superior mesenteric vein and within the inferior anterior midline abdomen. This appearance is concerning for intermittent volvulus. There is no evidence of bowel ischemia or obstruction. 2. Dilated and gas-filled transverse colon without evidence of colonic obstruction. 3. Diffuse muscular atrophy, consistent with muscular dystrophy. 4. Moderate thickening of the urinary bladder may indicate chronic obstruction, neurogenic bladder or cystitis. Electronically Signed   By: Ulyses Jarred M.D.   On: 01/10/2018 01:34    Anti-infectives: Anti-infectives (From admission, onward)   None     Assessment/Plan Abdominal pain CT findings concerning for volvulus with swirling of mesentery. - case discussed with GI this AM with plans for tap water enema, potassium repletion, and lower endoscopy today by Dr. Oletta Lamas. - Patient may require partial colectomy this admission.   FEN: NPO, IVF ID: none VTE: SCD's, chemical VTE held for procedure Foley: none    LOS: 0 days    Jill Alexanders , [facility] 01/10/2018, 9:34 AM Pager: [phone] Consults: [phone] Mon-Fri 7:00 am-4:30 pm Sat-Sun 7:00 am-11:30 am

## 2018-01-11 ENCOUNTER — Inpatient Hospital Stay (HOSPITAL_COMMUNITY): Payer: Managed Care, Other (non HMO)

## 2018-01-11 LAB — CBC
HCT: 49.5 % (ref 39.0–52.0)
HCT: 41.4 % (ref 39.0–52.0)
Hemoglobin: 14.8 g/dL (ref 13.0–17.0)
Hemoglobin: 13.1 g/dL (ref 13.0–17.0)
MCH: 29.1 pg (ref 26.0–34.0)
MCH: 29.4 pg (ref 26.0–34.0)
MCHC: 29.9 g/dL — ABNORMAL LOW (ref 30.0–36.0)
MCHC: 31.6 g/dL (ref 30.0–36.0)
MCV: 97.4 fL (ref 78.0–100.0)
MCV: 93 fL (ref 78.0–100.0)
PLATELETS: 233 10*3/uL (ref 150–400)
PLATELETS: 137 10*3/uL — AB (ref 150–400)
RBC: 5.08 MIL/uL (ref 4.22–5.81)
RBC: 4.45 MIL/uL (ref 4.22–5.81)
RDW: 13.9 % (ref 11.5–15.5)
RDW: 13.7 % (ref 11.5–15.5)
WBC: 14.7 10*3/uL — ABNORMAL HIGH (ref 4.0–10.5)
WBC: 6.5 10*3/uL (ref 4.0–10.5)

## 2018-01-11 LAB — BASIC METABOLIC PANEL
ANION GAP: 7 (ref 5–15)
Anion gap: 6 (ref 5–15)
BUN: <5 mg/dL — ABNORMAL LOW (ref 6–20)
CALCIUM: 8.4 mg/dL — AB (ref 8.9–10.3)
CO2: 35 mmol/L — AB (ref 22–32)
CO2: 35 mmol/L — ABNORMAL HIGH (ref 22–32)
Calcium: 8.7 mg/dL — ABNORMAL LOW (ref 8.9–10.3)
Chloride: 94 mmol/L — ABNORMAL LOW (ref 101–111)
Chloride: 94 mmol/L — ABNORMAL LOW (ref 101–111)
GLUCOSE: 235 mg/dL — AB (ref 65–99)
GLUCOSE: 119 mg/dL — AB (ref 65–99)
Potassium: 5.3 mmol/L — ABNORMAL HIGH (ref 3.5–5.1)
Potassium: 3.6 mmol/L (ref 3.5–5.1)
Sodium: 135 mmol/L (ref 135–145)
Sodium: 136 mmol/L (ref 135–145)

## 2018-01-11 LAB — GLUCOSE, CAPILLARY
Glucose-Capillary: 146 mg/dL — ABNORMAL HIGH (ref 65–99)
Glucose-Capillary: 132 mg/dL — ABNORMAL HIGH (ref 65–99)
Glucose-Capillary: 91 mg/dL (ref 65–99)

## 2018-01-11 LAB — MRSA PCR SCREENING: MRSA BY PCR: NEGATIVE

## 2018-01-11 LAB — LACTIC ACID, PLASMA: LACTIC ACID, VENOUS: 0.6 mmol/L (ref 0.5–1.9)

## 2018-01-11 MED ORDER — LIP MEDEX EX OINT
TOPICAL_OINTMENT | CUTANEOUS | Status: AC
  Administered 2018-01-11: 12:00:00
  Filled 2018-01-11: qty 7

## 2018-01-11 MED ORDER — SODIUM CHLORIDE 0.9 % IV BOLUS: 500.0000 mL | Freq: Once | INTRAVENOUS | Status: DC

## 2018-01-11 MED ORDER — HYDROMORPHONE HCL 1 MG/ML IJ SOLN
0.5000 mg | INTRAMUSCULAR | Status: DC | PRN
Start: 2018-01-11 — End: 2018-01-13
  Administered 2018-01-12: 0.5 mg via INTRAVENOUS
  Filled 2018-01-11: qty 1

## 2018-01-11 MED ORDER — KCL IN DEXTROSE-NACL 20-5-0.45 MEQ/L-%-% IV SOLN
INTRAVENOUS | Status: DC
  Administered 2018-01-11 – 2018-01-13 (×5): via INTRAVENOUS
  Administered 2018-01-14: 125 mL/h via INTRAVENOUS
  Filled 2018-01-11 (×11): qty 1000

## 2018-01-11 MED ORDER — DEXTROSE-NACL 5-0.45 % IV SOLN
INTRAVENOUS | Status: DC
  Administered 2018-01-11: 11:00:00 via INTRAVENOUS

## 2018-01-11 MED ORDER — LIP MEDEX EX OINT: TOPICAL_OINTMENT | CUTANEOUS | Status: DC | PRN

## 2018-01-11 MED ORDER — DEXTROSE-NACL 5-0.45 % IV SOLN: INTRAVENOUS | Status: DC

## 2018-01-11 NOTE — Progress Notes (Signed)
Rapid response called due to patient being very lethargic and difficult to arouse. Patient also hypotensive and tachycardic. MD paged. Will await orders and continue to monitor

## 2018-01-11 NOTE — Significant Event (Addendum)
Rapid Response Event Note  Overview: Time Called: 0935 Arrival Time: 0938 Event Type: Hypotension  Initial Focused Assessment: Pt lying in bed, very drowsy but will respond and is oriented x4. Pt had received Dilaudid 1 mg at $Rem'@4'jYNt$  AM.  Skin is warm and dry, denies pain.  PEARL.   Interventions: Placed on monitor which shows ST.  IV fluids with K changed to plain NS.  Dr Marcello Moores called, will give 574ml bolus, and continue plain NS.    Plan of Care (if not transferred): Will continue to monitor pt on [facility].  Ask for order for lactic acid to R/O sepsis. Event Summary:   Pt remains drowsy but will arouse and is A&O.Marland Kitchen Family at bedside. Bedside RN has remains with pt.  BP improving with fluid bolus.  Now 104/63 (72)   HR 127             Jacklynn Lewis S

## 2018-01-11 NOTE — Progress Notes (Signed)
Paged MD about patient's condition. Continued tachycardia, hypotension, and elevated WBC noted. Recommendation by Rapid Response to have lactic acid drawn. MD aware and stated they would come to assess the patient. Will continue to monitor

## 2018-01-11 NOTE — Progress Notes (Signed)
CRITICAL VALUE ALERT  Critical Value:  K  3.6  Date & Time Notied:  4/6  4:20pm  Provider Notified: Dr Harlow Asa at 4:30 pm  Orders Received/Actions taken:

## 2018-01-11 NOTE — Progress Notes (Addendum)
[facility]  Assessment & Plan: Sigmoid volvulus  NPO, ice chips  Rectal tube in place per GI  Abd x-ray now  Denies pain, no abdominal distension Tachycardia, hypotension  Recurrent episodes today  Will transfer to stepdown for monitoring  Hospitalist consult if persistent  Will repeat CBC, lactic acid now Elevated potassium level  Change in IV fluid  Repeat BMET  Discussed with family and patient at bedside.  They agree with transfer to stepdown for monitoring.  No indications for urgent operation at present.        Earnstine Regal, MD, [facility]       Office: [phone]    Chief Complaint: Sigmoid volvulus  Subjective: Patient in bed, comfortable, denies pain.  Family at bedside.  Objective: Vital signs in last 24 hours: Temp:  [98 F (36.7 C)-99.1 F (37.3 C)] 98.9 F (37.2 C) (04/06 1325) Pulse Rate:  [112-146] 126 (04/06 1325) Resp:  [13-18] 18 (04/06 1325) BP: (92-132)/(45-85) 96/58 (04/06 1325) SpO2:  [97 %-100 %] 99 % (04/06 1325) Weight:  [33.2 kg (73 lb 3.1 oz)] 33.2 kg (73 lb 3.1 oz) (04/06 0547) Last BM Date: 01/08/18  Intake/Output from previous day: 04/05 0701 - 04/06 0700 In: 1179.8 [I.V.:1179.8] Out: 550 [Urine:550] Intake/Output this shift: Total I/O In: 2000 [I.V.:2000] Out: -   Physical Exam: HEENT - sclerae clear, mucous membranes moist Neck - soft Chest - clear bilaterally Cor - RRR, 90's Abdomen - soft,scaphoid; non-tender; no mass; no guarding   Lab Results:  Recent Labs    01/10/18 0708 01/11/18 0454  WBC 5.4 14.7*  HGB 13.0 14.8  HCT 42.2 49.5  PLT 151 233   BMET Recent Labs    01/09/18 2315 01/10/18 0708 01/11/18 0454  NA 135  --  135  K 3.8  --  5.3*  CL 99*  --  94*  CO2 27  --  35*  GLUCOSE 125*  --  235*  BUN 6  --  <5*  CREATININE <0.30* <0.30* <0.30*  CALCIUM 8.6*  --  8.7*   PT/INR No results for input(s): LABPROT, INR in  the last 72 hours. Comprehensive Metabolic Panel:    Component Value Date/Time   NA 135 01/11/2018 0454   NA 135 01/09/2018 2315   K 5.3 (H) 01/11/2018 0454   K 3.8 01/09/2018 2315   CL 94 (L) 01/11/2018 0454   CL 99 (L) 01/09/2018 2315   CO2 35 (H) 01/11/2018 0454   CO2 27 01/09/2018 2315   BUN <5 (L) 01/11/2018 0454   BUN 6 01/09/2018 2315   CREATININE <0.30 (L) 01/11/2018 0454   CREATININE <0.30 (L) 01/10/2018 0708   GLUCOSE 235 (H) 01/11/2018 0454   GLUCOSE 125 (H) 01/09/2018 2315   CALCIUM 8.7 (L) 01/11/2018 0454   CALCIUM 8.6 (L) 01/09/2018 2315   AST 35 01/09/2018 2315   AST 30 12/01/2017 1326   ALT 25 01/09/2018 2315   ALT 29 12/01/2017 1326   ALKPHOS 59 01/09/2018 2315   ALKPHOS 43 12/01/2017 1326   BILITOT 0.7 01/09/2018 2315   BILITOT 0.6 12/01/2017 1326   PROT 7.1 01/09/2018 2315   PROT 6.7 12/01/2017 1326   ALBUMIN 4.3 01/09/2018 2315   ALBUMIN 3.6 12/01/2017 1326    Studies/Results: Ct Abdomen Pelvis W Contrast  Result Date: 01/10/2018 CLINICAL DATA:  Acute abdominal pain. History of muscular dystrophy. EXAM: CT ABDOMEN AND PELVIS  WITH CONTRAST TECHNIQUE: Multidetector CT imaging of the abdomen and pelvis was performed using the standard protocol following bolus administration of intravenous contrast. CONTRAST:  110mL ISOVUE-300 IOPAMIDOL (ISOVUE-300) INJECTION 61% COMPARISON:  CT abdomen pelvis 09/23/2017 FINDINGS: LOWER CHEST: No basilar pulmonary nodules or pleural effusion. No apical pericardial effusion. HEPATOBILIARY: Normal hepatic contours and density. No intra- or extrahepatic biliary dilatation. Normal gallbladder. PANCREAS: Normal parenchymal contours without ductal dilatation. No peripancreatic fluid collection. SPLEEN: Normal. ADRENALS/URINARY TRACT: --Adrenal glands: Normal. --Right kidney/ureter: No hydronephrosis, nephroureterolithiasis, perinephric stranding or solid renal mass. --Left kidney/ureter: No hydronephrosis, nephroureterolithiasis,  perinephric stranding or solid renal mass. --Urinary bladder: Moderate wall thickening. STOMACH/BOWEL: --Stomach/Duodenum: No hiatal hernia or other gastric abnormality. Normal duodenal course. --Small bowel: No dilatation or inflammation. --Colon: There is gaseous dilatation of the transverse colon. No colonic inflammation. --Appendix: Not visualized. No right lower quadrant inflammation or free fluid. VASCULAR/LYMPHATIC: There is swirling of the mesenteric vessels, most notable at the SMV, just below the porta splenic confluence (series 2 images 38-40. There is a second area of mesenteric vascular swirling in the midline low anterior abdomen. These findings are new from the studies of 09/23/2017 and 05/05/2016. No abdominal or pelvic lymphadenopathy. REPRODUCTIVE: No free fluid in the pelvis. MUSCULOSKELETAL. Diffuse severe muscular atrophy is compatible with reported muscular dystrophy. 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 OTHER: None. IMPRESSION: 1. Swirling of the mesenteric vessels surrounding the proximal superior mesenteric vein and within the inferior anterior midline abdomen. This appearance is concerning for intermittent volvulus. There is no evidence of bowel ischemia or obstruction. 2. Dilated and gas-filled transverse colon without evidence of colonic obstruction. 3. Diffuse muscular atrophy, consistent with muscular dystrophy. 4. Moderate thickening of the urinary bladder may indicate chronic obstruction, neurogenic bladder or cystitis. Electronically Signed   By: Ulyses Jarred M.D.   On: 01/10/2018 01:34      [name] M 01/11/2018  Patient ID: Peter Hayes, male   DOB: [birth year], 22 y.o.   MRN: [id number]

## 2018-01-11 NOTE — Progress Notes (Signed)
[facility] Progress Note  Subjective: Patient seen. Has been seen by surgery today also. No complaints of abdominal pain.  Objective: Vital signs in last 24 hours: Temp:  [98 F (36.7 C)-99.1 F (37.3 C)] 99.1 F (37.3 C) (04/06 1058) Pulse Rate:  [112-146] 130 (04/06 1058) Resp:  [8-16] 16 (04/06 1001) BP: (92-146)/(45-85) 114/72 (04/06 1058) SpO2:  [97 %-100 %] 100 % (04/06 1058) Weight:  [33.2 kg (73 lb 3.1 oz)] 33.2 kg (73 lb 3.1 oz) (04/06 0547) Weight change: -11.8 kg (-26 lb)   PE:  Abdomen: Flat, soft and nontender  Lab Results: Results for orders placed or performed during the hospital encounter of 01/09/18 (from the past 24 hour(s))  Basic metabolic panel     Status: Abnormal   Collection Time: 01/11/18  4:54 AM  Result Value Ref Range   Sodium 135 135 - 145 mmol/L   Potassium 5.3 (H) 3.5 - 5.1 mmol/L   Chloride 94 (L) 101 - 111 mmol/L   CO2 35 (H) 22 - 32 mmol/L   Glucose, Bld 235 (H) 65 - 99 mg/dL   BUN <5 (L) 6 - 20 mg/dL   Creatinine, Ser <0.30 (L) 0.61 - 1.24 mg/dL   Calcium 8.7 (L) 8.9 - 10.3 mg/dL   GFR calc non Af Amer NOT CALCULATED >60 mL/min   GFR calc Af Amer NOT CALCULATED >60 mL/min   Anion gap 6 5 - 15  CBC     Status: Abnormal   Collection Time: 01/11/18  4:54 AM  Result Value Ref Range   WBC 14.7 (H) 4.0 - 10.5 K/uL   RBC 5.08 4.22 - 5.81 MIL/uL   Hemoglobin 14.8 13.0 - 17.0 g/dL   HCT 49.5 39.0 - 52.0 %   MCV 97.4 78.0 - 100.0 fL   MCH 29.1 26.0 - 34.0 pg   MCHC 29.9 (L) 30.0 - 36.0 g/dL   RDW 13.9 11.5 - 15.5 %   Platelets 233 150 - 400 K/uL  Glucose, capillary     Status: Abnormal   Collection Time: 01/11/18  8:52 AM  Result Value Ref Range   Glucose-Capillary 146 (H) 65 - 99 mg/dL   Comment 1 Notify RN    Comment 2 Document in Chart   Glucose, capillary     Status: Abnormal   Collection Time: 01/11/18  9:36 AM  Result Value Ref Range   Glucose-Capillary 132 (H) 65 - 99 mg/dL  Glucose, capillary     Status: None   Collection Time: 01/11/18 10:37 AM  Result Value Ref Range   Glucose-Capillary 91 65 - 99 mg/dL   Comment 1 Notify RN    Comment 2 Document in Chart     Studies/Results: No results found.    Assessment:  volvulus  Plan:   As per surgery they do not plan on doing any operations this admission unless issue does not resolve.He appears to be improving.    SAM F GANEM 01/11/2018, 11:06 AM  Pager: [phone] If no answer or after 5 PM call [phone]

## 2018-01-11 NOTE — Progress Notes (Signed)
[facility] Progress Note  1 Day Post-Op  Subjective: CC:  Pt sedated this am.  Family at bedside state pain improved   Objective: Vital signs in last 24 hours: Temp:  [97.9 F (36.6 C)-98.9 F (37.2 C)] 98.9 F (37.2 C) (04/05 2209) Pulse Rate:  [112-146] 146 (04/06 0547) Resp:  [8-16] 13 (04/06 0547) BP: (92-146)/(45-85) 132/85 (04/06 0547) SpO2:  [97 %-100 %] 100 % (04/06 0547) Weight:  [33.2 kg (73 lb 3.1 oz)-33.6 kg (74 lb)] 33.2 kg (73 lb 3.1 oz) (04/06 0547) Last BM Date: 01/08/18  Intake/Output from previous day: 04/05 0701 - 04/06 0700 In: 1179.8 [I.V.:1179.8] Out: 550 [Urine:550] Intake/Output this shift: No intake/output data recorded.  PE: Gen:  Alert, NAD, pleasant Abd: soft, non-distended Skin: warm and dry, no rashes  MSK: muscle wasting of bilateral upper and lower extremities, contractures of BLE.  Psych: A&Ox3   Lab Results:  Recent Labs    01/10/18 0708 01/11/18 0454  WBC 5.4 14.7*  HGB 13.0 14.8  HCT 42.2 49.5  PLT 151 233   BMET Recent Labs    01/09/18 2315 01/10/18 0708 01/11/18 0454  NA 135  --  135  K 3.8  --  5.3*  CL 99*  --  94*  CO2 27  --  35*  GLUCOSE 125*  --  235*  BUN 6  --  <5*  CREATININE <0.30* <0.30* <0.30*  CALCIUM 8.6*  --  8.7*   PT/INR No results for input(s): LABPROT, INR in the last 72 hours. CMP     Component Value Date/Time   NA 135 01/11/2018 0454   K 5.3 (H) 01/11/2018 0454   CL 94 (L) 01/11/2018 0454   CO2 35 (H) 01/11/2018 0454   GLUCOSE 235 (H) 01/11/2018 0454   BUN <5 (L) 01/11/2018 0454   CREATININE <0.30 (L) 01/11/2018 0454   CALCIUM 8.7 (L) 01/11/2018 0454   PROT 7.1 01/09/2018 2315   ALBUMIN 4.3 01/09/2018 2315   AST 35 01/09/2018 2315   ALT 25 01/09/2018 2315   ALKPHOS 59 01/09/2018 2315   BILITOT 0.7 01/09/2018 2315   GFRNONAA NOT CALCULATED 01/11/2018 0454   GFRAA NOT CALCULATED 01/11/2018 0454   Lipase     Component Value Date/Time   LIPASE 23 01/09/2018 2315        Studies/Results: Ct Abdomen Pelvis W Contrast  Result Date: 01/10/2018 CLINICAL DATA:  Acute abdominal pain. History of muscular dystrophy. EXAM: CT ABDOMEN AND PELVIS WITH CONTRAST TECHNIQUE: Multidetector CT imaging of the abdomen and pelvis was performed using the standard protocol following bolus administration of intravenous contrast. CONTRAST:  153mL ISOVUE-300 IOPAMIDOL (ISOVUE-300) INJECTION 61% COMPARISON:  CT abdomen pelvis 09/23/2017 FINDINGS: LOWER CHEST: No basilar pulmonary nodules or pleural effusion. No apical pericardial effusion. HEPATOBILIARY: Normal hepatic contours and density. No intra- or extrahepatic biliary dilatation. Normal gallbladder. PANCREAS: Normal parenchymal contours without ductal dilatation. No peripancreatic fluid collection. SPLEEN: Normal. ADRENALS/URINARY TRACT: --Adrenal glands: Normal. --Right kidney/ureter: No hydronephrosis, nephroureterolithiasis, perinephric stranding or solid renal mass. --Left kidney/ureter: No hydronephrosis, nephroureterolithiasis, perinephric stranding or solid renal mass. --Urinary bladder: Moderate wall thickening. STOMACH/BOWEL: --Stomach/Duodenum: No hiatal hernia or other gastric abnormality. Normal duodenal course. --Small bowel: No dilatation or inflammation. --Colon: There is gaseous dilatation of the transverse colon. No colonic inflammation. --Appendix: Not visualized. No right lower quadrant inflammation or free fluid. VASCULAR/LYMPHATIC: There is swirling of the mesenteric vessels, most notable at the SMV, just below the porta splenic confluence (series 2 images 38-40.  There is a second area of mesenteric vascular swirling in the midline low anterior abdomen. These findings are new from the studies of 09/23/2017 and 05/05/2016. No abdominal or pelvic lymphadenopathy. REPRODUCTIVE: No free fluid in the pelvis. MUSCULOSKELETAL. Diffuse severe muscular atrophy is compatible with reported muscular dystrophy. 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 OTHER: None. IMPRESSION: 1. Swirling of the mesenteric vessels surrounding the proximal superior mesenteric vein and within the inferior anterior midline abdomen. This appearance is concerning for intermittent volvulus. There is no evidence of bowel ischemia or obstruction. 2. Dilated and gas-filled transverse colon without evidence of colonic obstruction. 3. Diffuse muscular atrophy, consistent with muscular dystrophy. 4. Moderate thickening of the urinary bladder may indicate chronic obstruction, neurogenic bladder or cystitis. Electronically Signed   By: Ulyses Jarred M.D.   On: 01/10/2018 01:34    Anti-infectives: Anti-infectives (From admission, onward)   None     Assessment/Plan Abdominal pain CT findings concerning for volvulus with swirling of mesentery. - rectal tube in place per GI. - would not perform surgery this admission unless this does not resolved - Pt will need a good bowel regimen upon d/c   FEN: NPO, IVF ID: none VTE: SCD's, chemical VTE held for procedure Foley: none    LOS: 1 day   Rosario Adie, MD  [facility]

## 2018-01-12 ENCOUNTER — Encounter (HOSPITAL_COMMUNITY): Payer: Self-pay | Admitting: Gastroenterology

## 2018-01-12 NOTE — Progress Notes (Signed)
[facility]  Assessment & Plan: Sigmoid volvulus             NPO, ice chips             Rectal tube in place per GI             AXR with improvement  Rectal tube management per GI - maybe remove today  If rectal tube out, may start clear liquid diet - defer to GI consultants             Denies pain, no abdominal distension Tachycardia, hypotension             In stepdown for monitoring             Labs OK yesterday afternoon Elevated potassium level             Improved with IVF change        Earnstine Regal, MD, [facility]       Office: [phone]    Chief Complaint: Sigmoid volvulus  Subjective: Patient in bed, awake, responsive, complains of pain from rectal tube  Objective: Vital signs in last 24 hours: Temp:  [98.4 F (36.9 C)-99.1 F (37.3 C)] 98.5 F (36.9 C) (04/07 0427) Pulse Rate:  [112-130] 112 (04/07 0608) Resp:  [13-19] 13 (04/07 0608) BP: (92-114)/(50-74) 93/50 (04/07 0608) SpO2:  [92 %-100 %] 94 % (04/07 0608) Weight:  [35.2 kg (77 lb 9.6 oz)] 35.2 kg (77 lb 9.6 oz) (04/06 1614) Last BM Date: 01/08/18  Intake/Output from previous day: 04/06 0701 - 04/07 0700 In: 3439.6 [I.V.:3439.6] Out: 200 [Urine:200] Intake/Output this shift: No intake/output data recorded.  Physical Exam: HEENT - sclerae clear, mucous membranes moist Neck - soft Chest - clear bilaterally Cor - RRR Abdomen - soft, scaphoid, non-tender  Lab Results:  Recent Labs    01/11/18 0454 01/11/18 1507  WBC 14.7* 6.5  HGB 14.8 13.1  HCT 49.5 41.4  PLT 233 137*   BMET Recent Labs    01/11/18 0454 01/11/18 1507  NA 135 136  K 5.3* 3.6  CL 94* 94*  CO2 35* 35*  GLUCOSE 235* 119*  BUN <5* <5*  CREATININE <0.30* <0.30*  CALCIUM 8.7* 8.4*   PT/INR No results for input(s): LABPROT, INR in the last 72 hours. Comprehensive Metabolic Panel:    Component Value Date/Time   NA 136 01/11/2018 1507   NA 135 01/11/2018 0454   K 3.6 01/11/2018 1507   K 5.3 (H) 01/11/2018 0454   CL 94 (L) 01/11/2018 1507   CL 94 (L) 01/11/2018 0454   CO2 35 (H) 01/11/2018 1507   CO2 35 (H) 01/11/2018 0454   BUN <5 (L) 01/11/2018 1507   BUN <5 (L) 01/11/2018 0454   CREATININE <0.30 (L) 01/11/2018 1507   CREATININE <0.30 (L) 01/11/2018 0454   GLUCOSE 119 (H) 01/11/2018 1507   GLUCOSE 235 (H) 01/11/2018 0454   CALCIUM 8.4 (L) 01/11/2018 1507   CALCIUM 8.7 (L) 01/11/2018 0454   AST 35 01/09/2018 2315   AST 30 12/01/2017 1326   ALT 25 01/09/2018 2315   ALT 29 12/01/2017 1326   ALKPHOS 59 01/09/2018 2315   ALKPHOS 43 12/01/2017 1326   BILITOT 0.7 01/09/2018 2315   BILITOT 0.6 12/01/2017 1326   PROT 7.1 01/09/2018 2315   PROT 6.7 12/01/2017 1326   ALBUMIN 4.3 01/09/2018 2315   ALBUMIN 3.6  12/01/2017 1326    Studies/Results: Dg Abd Portable 2v  Result Date: 01/11/2018 CLINICAL DATA:  Follow-up sigmoid volvulus EXAM: PORTABLE ABDOMEN - 2 VIEW COMPARISON:  01/10/2018 FINDINGS: Rectal tube is now seen. The degree of colonic dilatation has improved significantly. Contrast material is noted within the proximal right colon from the recent CT examination. Chronic bony changes are noted. No free air is seen. IMPRESSION: Large rectal tube in place. Significant decompression of the colon when compared with the prior exam. Electronically Signed   By: Inez Catalina M.D.   On: 01/11/2018 17:00      Alassane Kalafut M 01/12/2018  Patient ID: Peter Hayes, male   DOB: 10/18/1995, 22 y.o.   MRN: [id number]

## 2018-01-12 NOTE — Progress Notes (Signed)
[facility] Progress Note  Subjective: Patient says his abdomen feels normal. Had reduction of sigmoid volvulus a few days ago.  Objective: Vital signs in last 24 hours: Temp:  [97.2 F (36.2 C)-99.1 F (37.3 C)] 97.2 F (36.2 C) (04/07 0800) Pulse Rate:  [107-130] 110 (04/07 1000) Resp:  [13-19] 13 (04/07 1000) BP: (93-114)/(50-80) 111/80 (04/07 1000) SpO2:  [92 %-100 %] 100 % (04/07 1000) Weight:  [35.2 kg (77 lb 9.6 oz)] 35.2 kg (77 lb 9.6 oz) (04/06 1614) Weight change: 1.634 kg (3 lb 9.6 oz)   PE:  No distress  Abdomen flat, soft, nontender  Lab Results: Results for orders placed or performed during the hospital encounter of 01/09/18 (from the past 24 hour(s))  Lactic acid, plasma     Status: None   Collection Time: 01/11/18  3:07 PM  Result Value Ref Range   Lactic Acid, Venous 0.6 0.5 - 1.9 mmol/L  Basic metabolic panel     Status: Abnormal   Collection Time: 01/11/18  3:07 PM  Result Value Ref Range   Sodium 136 135 - 145 mmol/L   Potassium 3.6 3.5 - 5.1 mmol/L   Chloride 94 (L) 101 - 111 mmol/L   CO2 35 (H) 22 - 32 mmol/L   Glucose, Bld 119 (H) 65 - 99 mg/dL   BUN <5 (L) 6 - 20 mg/dL   Creatinine, Ser <0.30 (L) 0.61 - 1.24 mg/dL   Calcium 8.4 (L) 8.9 - 10.3 mg/dL   GFR calc non Af Amer NOT CALCULATED >60 mL/min   GFR calc Af Amer NOT CALCULATED >60 mL/min   Anion gap 7 5 - 15  CBC     Status: Abnormal   Collection Time: 01/11/18  3:07 PM  Result Value Ref Range   WBC 6.5 4.0 - 10.5 K/uL   RBC 4.45 4.22 - 5.81 MIL/uL   Hemoglobin 13.1 13.0 - 17.0 g/dL   HCT 41.4 39.0 - 52.0 %   MCV 93.0 78.0 - 100.0 fL   MCH 29.4 26.0 - 34.0 pg   MCHC 31.6 30.0 - 36.0 g/dL   RDW 13.7 11.5 - 15.5 %   Platelets 137 (L) 150 - 400 K/uL  MRSA PCR Screening     Status: None   Collection Time: 01/11/18  4:19 PM  Result Value Ref Range   MRSA by PCR NEGATIVE NEGATIVE    Studies/Results: Dg Abd Portable 2v  Result Date: 01/11/2018 CLINICAL DATA:  Follow-up  sigmoid volvulus EXAM: PORTABLE ABDOMEN - 2 VIEW COMPARISON:  01/10/2018 FINDINGS: Rectal tube is now seen. The degree of colonic dilatation has improved significantly. Contrast material is noted within the proximal right colon from the recent CT examination. Chronic bony changes are noted. No free air is seen. IMPRESSION: Large rectal tube in place. Significant decompression of the colon when compared with the prior exam. Electronically Signed   By: Inez Catalina M.D.   On: 01/11/2018 17:00      Assessment: Probable sigmoid volvulus which was reduced endoscopically.  Plan:   Remove rectal tube Try clear liquids. Follow clinically    Cassell Clement 01/12/2018, 10:44 AM  Pager: [phone] If no answer or after 5 PM call [phone]

## 2018-01-13 ENCOUNTER — Inpatient Hospital Stay (HOSPITAL_COMMUNITY): Payer: Managed Care, Other (non HMO)

## 2018-01-13 DIAGNOSIS — J9601 Acute respiratory failure with hypoxia: Secondary | ICD-10-CM

## 2018-01-13 DIAGNOSIS — I959 Hypotension, unspecified: Secondary | ICD-10-CM

## 2018-01-13 DIAGNOSIS — K562 Volvulus: Principal | ICD-10-CM

## 2018-01-13 DIAGNOSIS — G7100 Muscular dystrophy, unspecified: Secondary | ICD-10-CM

## 2018-01-13 LAB — COMPREHENSIVE METABOLIC PANEL
ALT: 27 U/L (ref 17–63)
AST: 25 U/L (ref 15–41)
Albumin: 3.4 g/dL — ABNORMAL LOW (ref 3.5–5.0)
Alkaline Phosphatase: 58 U/L (ref 38–126)
Anion gap: 8 (ref 5–15)
BILIRUBIN TOTAL: 1 mg/dL (ref 0.3–1.2)
CHLORIDE: 100 mmol/L — AB (ref 101–111)
CO2: 33 mmol/L — ABNORMAL HIGH (ref 22–32)
Calcium: 8.7 mg/dL — ABNORMAL LOW (ref 8.9–10.3)
Creatinine, Ser: <0.3 mg/dL — ABNORMAL LOW (ref 0.61–1.24)
GLUCOSE: 121 mg/dL — AB (ref 65–99)
POTASSIUM: 4.2 mmol/L (ref 3.5–5.1)
Sodium: 141 mmol/L (ref 135–145)
TOTAL PROTEIN: 5.9 g/dL — AB (ref 6.5–8.1)

## 2018-01-13 LAB — URINALYSIS, ROUTINE W REFLEX MICROSCOPIC
Bilirubin Urine: NEGATIVE
Glucose, UA: NEGATIVE mg/dL
Hgb urine dipstick: NEGATIVE
Ketones, ur: NEGATIVE mg/dL
LEUKOCYTES UA: NEGATIVE
Nitrite: NEGATIVE
PROTEIN: NEGATIVE mg/dL
Specific Gravity, Urine: 1.008 (ref 1.005–1.030)
pH: 8 (ref 5.0–8.0)

## 2018-01-13 LAB — CBC
HEMATOCRIT: 45 % (ref 39.0–52.0)
Hemoglobin: 14 g/dL (ref 13.0–17.0)
MCH: 29.9 pg (ref 26.0–34.0)
MCHC: 31.1 g/dL (ref 30.0–36.0)
MCV: 95.9 fL (ref 78.0–100.0)
Platelets: 137 10*3/uL — ABNORMAL LOW (ref 150–400)
RBC: 4.69 MIL/uL (ref 4.22–5.81)
RDW: 13.9 % (ref 11.5–15.5)
WBC: 3.6 10*3/uL — AB (ref 4.0–10.5)

## 2018-01-13 LAB — D-DIMER, QUANTITATIVE (NOT AT ARMC): D DIMER QUANT: 0.27 ug{FEU}/mL (ref 0.00–0.50)

## 2018-01-13 LAB — ECHOCARDIOGRAM COMPLETE
Height: 60 in
WEIGHTICAEL: 1223.99 [oz_av]

## 2018-01-13 LAB — PREALBUMIN: Prealbumin: 9.7 mg/dL — ABNORMAL LOW (ref 18–38)

## 2018-01-13 LAB — TSH: TSH: 4.337 u[IU]/mL (ref 0.350–4.500)

## 2018-01-13 LAB — MAGNESIUM: Magnesium: 1.7 mg/dL (ref 1.7–2.4)

## 2018-01-13 MED ORDER — ACETAMINOPHEN 325 MG PO TABS: 650.0000 mg | ORAL_TABLET | Freq: Four times a day (QID) | ORAL | Status: DC | PRN

## 2018-01-13 MED ORDER — SODIUM CHLORIDE 0.9 % IV BOLUS
1000.0000 mL | Freq: Once | INTRAVENOUS | Status: AC
  Administered 2018-01-13: 1000 mL via INTRAVENOUS

## 2018-01-13 MED ORDER — ADULT MULTIVITAMIN W/MINERALS CH
1.0000 | ORAL_TABLET | Freq: Every day | ORAL | Status: DC
  Administered 2018-01-13 – 2018-01-15 (×3): 1 via ORAL
  Filled 2018-01-13 (×3): qty 1

## 2018-01-13 MED ORDER — ACETAMINOPHEN 650 MG RE SUPP: 650.0000 mg | Freq: Four times a day (QID) | RECTAL | Status: DC | PRN

## 2018-01-13 MED ORDER — BOOST / RESOURCE BREEZE PO LIQD CUSTOM
1.0000 | ORAL | Status: DC
  Administered 2018-01-15: 1 via ORAL

## 2018-01-13 MED ORDER — OXYCODONE HCL 5 MG PO TABS: 2.5000 mg | ORAL_TABLET | ORAL | Status: DC | PRN

## 2018-01-13 MED ORDER — ENSURE ENLIVE PO LIQD: 237.0000 mL | Freq: Two times a day (BID) | ORAL | Status: DC

## 2018-01-13 NOTE — Progress Notes (Addendum)
3 Days Post-Op    CC:  Sigmoid volvulus  Subjective: Comfortable in bed.  He is bed bound at home.  Father says he helps him get OOB.  NO medicines at home.  He is not distended this AM.  Comfortable in bed.  He appears significantly malnourished and deconditioned with contractures.    Objective: Vital signs in last 24 hours: Temp:  [97.9 F (36.6 C)-98.5 F (36.9 C)] 98.1 F (36.7 C) (04/08 0442) Pulse Rate:  [89-110] 89 (04/08 0600) Resp:  [12-20] 13 (04/08 0600) BP: (82-111)/(46-80) 84/50 (04/08 0600) SpO2:  [95 %-100 %] 99 % (04/08 0600) Weight:  [34.7 kg (76 lb 8 oz)] 34.7 kg (76 lb 8 oz) (04/08 0442) Last BM Date: 01/08/18 3000 IV Nothing PO recorded Urine x 3 recorded  Stool x 2 recorded Afebrile, tachycardic with low BP SBP down into the 80's last 12 hours Weight 45.4 KG on admit, then down to 33.8 - bouncing around; 34. 01/12/18 NO labs this AM Last film 4/6:  Significant decompression of the colon when compared with the prior exam.  Rectal tube in place.  Appreciate GI assistance.     Intake/Output from previous day: 04/07 0701 - 04/08 0700 In: 3000 [I.V.:3000] Out: -  Intake/Output this shift: No intake/output data recorded.  General appearance: alert, cooperative and no distress Resp: clear to auscultation bilaterally GI: soft, not distended, BS are hypoactive, BM reported but he says it was mostly gas.  Lab Results:  Recent Labs    01/11/18 0454 01/11/18 1507  WBC 14.7* 6.5  HGB 14.8 13.1  HCT 49.5 41.4  PLT 233 137*    BMET Recent Labs    01/11/18 0454 01/11/18 1507  NA 135 136  K 5.3* 3.6  CL 94* 94*  CO2 35* 35*  GLUCOSE 235* 119*  BUN <5* <5*  CREATININE <0.30* <0.30*  CALCIUM 8.7* 8.4*   PT/INR No results for input(s): LABPROT, INR in the last 72 hours.  Recent Labs  Lab 01/09/18 2315  AST 35  ALT 25  ALKPHOS 59  BILITOT 0.7  PROT 7.1  ALBUMIN 4.3     Lipase     Component Value Date/Time   LIPASE 23 01/09/2018 2315      Prior to Admission medications   Not on File    Medications: . enoxaparin (LOVENOX) injection  30 mg Subcutaneous Q24H  . metoprolol succinate  12.5 mg Oral Daily  . polyethylene glycol  17 g Oral Daily   . dextrose 5 % and 0.45 % NaCl with KCl 20 mEq/L 125 mL/hr at 01/13/18 0600  . sodium chloride     Anti-infectives (From admission, onward)   None      Assessment/Plan Muscular dystrophy Gross underdevelopment and contractures of lower extremities.  Significant scoliosis.  [place] Witness - no blood products  Sigmoid volvulus Flexible sigmoidoscopy, decompression of Sigmoid volvulus, rectal tube placement 01/10/18 - Dr. Laurence Spates  Tachycardia -  rx with BB/Medicine consult Malnutrition/deconditioning - nutrition consult  FEN: Clear liquids 4/7AM/ IVF ID: none VTE: SCD's, Lovenox Foley: none   Plan:  Continue clears, recheck films, and labs.  Ask Nutrition to see and help with diet.  BP is down and I will hold up on BB and ask Medicine to see and assist with Medical management.  He is on no medications at home per his father this AM. Appreciate GI assistance.        LOS: 3 days    Lizzette Carbonell 01/13/2018 [phone]

## 2018-01-13 NOTE — Consult Note (Signed)
[facility] Medical Consultation  Peter Hayes UXL:[id number] DOB: Aug 25, 1996 DOA: 01/09/2018 PCP: Penni Bombard, PA   Requesting physician: [facility], Dr. Kieth Brightly Date of consultation: 01/13/2018 Reason for consultation: sinus tachycardia and hypotension  Impression/Recommendations  Hypotension, likely a combination of acute illness, recent beta blocker therapy, and pain medications -  Agree with stopping beta blocker -  Check cortisol level  -  D/c IV pain medications  -  Tylenol with low dose oxycodone for breakthrough pain -  Orthostatic vital signs (lying and sitting) -  NS bolus, then continue IVF -  Monitor for developing sepsis -  UA: negative -  Denies respiratory symptoms -  Call PCPs office and his blood pressures usually range in the low 100s over 70s, pulse anywhere from the 60s to the 120s  Sinus tachycardia, chronic. Previously worked up in 2017.   -  TSH:  wnl -  D-dimer:  Negative -  ECHO -  Patient was evaluated by EP in 2017 who recommended no treatment of patient's mild sinus tachycardia - He may follow up as an outpatient with his PCP, recommend referral to MD specialty clinic   Underweight -  Nutrition consultation -  Supplements  Muscular dystrophy, stable  Sigmoid volvulus s/p endoscopic reduction -  Per GI and general surgery   I will followup again tomorrow. Please contact me if I can be of assistance in the meanwhile. Thank you for this consultation.  Chief Complaint: abdominal pain  HPI:  The patient is a 22 year old male with muscular dystrophy who presented with severe abdominal pain and he was found to have sigmoid volvulus.  General surgery and gastroenterology were consulted.  He went endoscopic reduction of his volvulus on 4/5.  His post procedure course has been complicated by an hypotension and tachycardia.  The patient states that he feels well.  He denies any abdominal pains and he has been drinking more clear liquids since his  procedure.  He is passing gas but has not had a bowel movement.  He denies lightheadedness, dizziness, fevers or chills, sore throat, sinus congestion, chest pains, palpitations.  He has been receiving some IV fluids and has had multiple voids per day.  Review of Systems:    Complete 12 point review of systems reviewed with patient and negative except as mentioned above.   Past Medical History:  Diagnosis Date  . MD (muscular dystrophy) ([place])   . Refusal of blood product    [name]   Past Surgical History:  Procedure Laterality Date  . EYE SURGERY    . FLEXIBLE SIGMOIDOSCOPY N/A 01/10/2018   Procedure: FLEXIBLE SIGMOIDOSCOPY;  Surgeon: Laurence Spates, MD;  Location: [facility];  Service: Endoscopy;  Laterality: N/A;   Social History:  reports that he has never smoked. He has never used smokeless tobacco. He reports that he does not drink alcohol or use drugs.  No Known Allergies History reviewed. No pertinent family history.  Prior to Admission medications   Not on File   Physical Exam: Blood pressure (!) 84/50, pulse 89, temperature 97.7 F (36.5 C), temperature source Oral, resp. rate 13, height 5' (1.524 m), weight 34.7 kg (76 lb 8 oz), SpO2 99 %. Vitals:   01/13/18 0442 01/13/18 0600 01/13/18 0800 01/13/18 1214  BP:  (!) 84/50    Pulse:  89    Resp:  13    Temp: 98.1 F (36.7 C)  97.8 F (36.6 C) 97.7 F (36.5 C)  TempSrc: Oral  Oral  Oral  SpO2:  99%    Weight: 34.7 kg (76 lb 8 oz)     Height:         General: Male, no acute distress, smiling  Eyes:  PERRL, anicteric, non-injected.  ENT:  Nares clear.  OP clear, non-erythematous without plaques or exudates.  MMM.  Neck:  Supple without TM or JVD.    Lymph:  No cervical, supraclavicular, or submandibular LAD.  Cardiovascular: Sinus tachycardia, regular rhythm, normal S1, S2, without m/r/g.  2+ pulses, warm extremities  Respiratory:  CTA bilaterally without increased WOB.  Abdomen:  NABS.   Soft, ND/NT.    Skin:  No rashes or focal lesions.  Musculoskeletal:  Normal bulk and tone.  No LE edema.  Psychiatric:  A & O x 4.  Appropriate affect.  Neurologic:  CN 3-12 intact.  3/5 strength all extremities.  Sensation intact.  Labs on Admission:  Basic Metabolic Panel: Recent Labs  Lab 01/09/18 2315 01/10/18 0708 01/11/18 0454 01/11/18 1507 01/13/18 0937  NA 135  --  135 136 141  K 3.8  --  5.3* 3.6 4.2  CL 99*  --  94* 94* 100*  CO2 27  --  35* 35* 33*  GLUCOSE 125*  --  235* 119* 121*  BUN 6  --  <5* <5* <5*  CREATININE <0.30* <0.30* <0.30* <0.30* <0.30*  CALCIUM 8.6*  --  8.7* 8.4* 8.7*  MG  --   --   --   --  1.7   Liver Function Tests: Recent Labs  Lab 01/09/18 2315 01/13/18 0937  AST 35 25  ALT 25 27  ALKPHOS 59 58  BILITOT 0.7 1.0  PROT 7.1 5.9*  ALBUMIN 4.3 3.4*   Recent Labs  Lab 01/09/18 2315  LIPASE 23   No results for input(s): AMMONIA in the last 168 hours. CBC: Recent Labs  Lab 01/09/18 2315 01/10/18 0708 01/11/18 0454 01/11/18 1507 01/13/18 0937  WBC 6.0 5.4 14.7* 6.5 3.6*  NEUTROABS 4.5  --   --   --   --   HGB 14.5 13.0 14.8 13.1 14.0  HCT 44.5 42.2 49.5 41.4 45.0  MCV 91.8 95.0 97.4 93.0 95.9  PLT 153 151 233 137* 137*   Cardiac Enzymes: No results for input(s): CKTOTAL, CKMB, CKMBINDEX, TROPONINI in the last 168 hours. BNP: Invalid input(s): POCBNP CBG: Recent Labs  Lab 01/11/18 0852 01/11/18 0936 01/11/18 1037  GLUCAP 146* 132* 91    Radiological Exams on Admission: Dg Abd Portable 1v  Result Date: 01/13/2018 CLINICAL DATA:  History of sigmoid volvulus EXAM: PORTABLE ABDOMEN - 1 VIEW COMPARISON:  01/11/2017 FINDINGS: Scattered large and small bowel gas is noted. The previously administered contrast progresses through the colon. No obstructive changes are seen. The rectal tube has been removed. No free air is noted. IMPRESSION: Persistent contrast within the colon which has progressed somewhat distally. The  previously seen rectal tube has been removed. Electronically Signed   By: Inez Catalina M.D.   On: 01/13/2018 09:20   Dg Abd Portable 2v  Result Date: 01/11/2018 CLINICAL DATA:  Follow-up sigmoid volvulus EXAM: PORTABLE ABDOMEN - 2 VIEW COMPARISON:  01/10/2018 FINDINGS: Rectal tube is now seen. The degree of colonic dilatation has improved significantly. Contrast material is noted within the proximal right colon from the recent CT examination. Chronic bony changes are noted. No free air is seen. IMPRESSION: Large rectal tube in place. Significant decompression of the colon when compared with the prior exam. Electronically  Signed   By: Inez Catalina M.D.   On: 01/11/2018 17:00    EKG: Independently reviewed.  Sinus tachycardia  Time spent: 75 min  [facility] Pager [phone]  If 7PM-7AM, please contact night-coverage [email or web address] Password [facility] 01/13/2018, 12:59 PM

## 2018-01-13 NOTE — Progress Notes (Signed)
Initial Nutrition Assessment  DOCUMENTATION CODES:   Underweight  INTERVENTION:  - Will order Boost Breeze once/day, this supplement provides 250 kcal and 9 grams of protein. - Will order Magic Cup dinner, this supplement provides 290 kcal and 9 grams of protein. - Diet advancement as medically feasible.  - Continue to encourage PO intakes.   NUTRITION DIAGNOSIS:   Inadequate oral intake related to acute illness as evidenced by per patient/family report.  GOAL:   Patient will meet greater than or equal to 90% of their needs  MONITOR:   PO intake, Supplement acceptance, Diet advancement, Weight trends, Labs, I & O's  REASON FOR ASSESSMENT:   Consult Assessment of nutrition requirement/status  ASSESSMENT:    22 yo M with muscular dystrophy who presented to the ED with intermittent severe abdominal pain. The pain has been occurring for around 18 hours with waves of worsening and lessening. It is principally in the lower abdomen. He denies nausea/vomiting/fever/chills. He has had similar symptoms in the past year, but they resolved on their own.  Diet advanced from NPO to CLD yesterday at 9:56 AM and from CLD to [place] today at 1:04 PM. No intakes have been documented since admission. Pt laying in bed with parents and sister at bedside. Pt able to communicate without any issue and dad also provides information. Pt usually has a very good appetite, eats 3 meals per day most days but does have some days of eating 2 meals/day. He does not consume oral nutrition supplements at home. Dad reports that pt was previously taking selenium and CoQ10 supplements each day but no other supplements. At baseline he has no chewing or swallowing difficulties. He sometimes feels as though foods get stuck in his throat but this happens very seldomly. He does not having abdominal pain or nausea with intakes at baseline. He requires feeding assistance.   Dad reports that appetite was at baseline until Thursday  (4/4) when pt began to experience abdominal pain. Pain worsened by Friday (4/5) so they came to the hospital. Noted that flex sig was done on that date. GI previously following and has now signed off. No plan for surgery at this time. Pt reports no BM since admission but that he has started passing gas.  NFPE not performed/not applicable d/t chronic muscle-wasting disease (MD). Pt has contractures and requires assistance with all ADLs, per dad's report. Per chart review, he has lost 3 lbs (4% body weight) in the past 1.5-2 months. This is not significant for time frame. Suspect that weight loss is a combination of chronic disease effects and no/very poor intakes for the past 4 days with need for current high rate IVF. Unable to state malnutrition based on ASPEN guidelines; pt also eats very well at baseline and expect to see gradual weight loss over time with muscular dystrophy.    Medications reviewed; 1 packet Miralax/day.  Labs reviewed; Cl: 100 mmol/L, BUN: <5 mg/dL, creatinine: <0.3 mg/dL, Ca: 8.7 mg/dL.  IVF: D5-1/2 NS-20 mEq KCl @ 125 mL/hr (510 kcal).    NUTRITION - FOCUSED PHYSICAL EXAM:  Did not complete/assess for muscle or fat wasting; no findings during micronutrient exam.   Diet Order:  Diet full liquid Room service appropriate? Yes; Fluid consistency: Thin  EDUCATION NEEDS:   No education needs have been identified at this time  Skin:  Skin Assessment: Reviewed RN Assessment  Last BM:  PTA/unknown  Height:   Ht Readings from Last 1 Encounters:  01/11/18 5' (1.524 m)  Weight:   Wt Readings from Last 1 Encounters:  01/13/18 76 lb 8 oz (34.7 kg)    Ideal Body Weight:  48.18 kg  BMI:  Body mass index is 14.94 kg/m.  Estimated Nutritional Needs:   Kcal:  1215-1390 (35-40 kcal/kg)  Protein:  52-62 grams (1.5-1.8 gram/skg)  Fluid:  >/= 1.5 L/day      Jarome Matin, MS, RD, LDN, [facility] Inpatient Clinical Dietitian Pager # [phone] After hours/weekend  pager # [phone]

## 2018-01-13 NOTE — Progress Notes (Signed)
Subjective: The patient was seen and examined at bedside. He has not had a BM but reports passing flatus. Heis able tolerate clear liquid diet and was given miralax in am.  Objective: Vital signs in last 24 hours: Temp:  [97.7 F (36.5 C)-98.1 F (36.7 C)] 97.7 F (36.5 C) (04/08 1214) Pulse Rate:  [89-104] 89 (04/08 0600) Resp:  [12-20] 13 (04/08 0600) BP: (82-95)/(46-62) 84/50 (04/08 0600) SpO2:  [95 %-99 %] 99 % (04/08 0600) Weight:  [34.7 kg (76 lb 8 oz)] 34.7 kg (76 lb 8 oz) (04/08 0442) Weight change: -0.5 kg (-1 lb 1.6 oz) Last BM Date: 01/08/18  UJ:WJXBJYN upright on bed, not in acute distress GENERAL:deformities from underlying muscular dystrophy ABDOMEN: soft, nontender, normoactive bowel sounds EXTREMITIES:contracted extremities  Lab Results: Results for orders placed or performed during the hospital encounter of 01/09/18 (from the past 48 hour(s))  Lactic acid, plasma     Status: None   Collection Time: 01/11/18  3:07 PM  Result Value Ref Range   Lactic Acid, Venous 0.6 0.5 - 1.9 mmol/L    Comment: Performed at [facility], [place] [address], [place], [place] [address]  Basic metabolic panel     Status: Abnormal   Collection Time: 01/11/18  3:07 PM  Result Value Ref Range   Sodium 136 135 - 145 mmol/L   Potassium 3.6 3.5 - 5.1 mmol/L    Comment: CRITICAL RESULT CALLED TO, READ BACK BY AND VERIFIED WITH: S.DILLON RN AT 1601 ON 01/11/18 BY S.VANHOORNE    Chloride 94 (L) 101 - 111 mmol/L   CO2 35 (H) 22 - 32 mmol/L   Glucose, Bld 119 (H) 65 - 99 mg/dL   BUN <5 (L) 6 - 20 mg/dL   Creatinine, Ser <0.30 (L) 0.61 - 1.24 mg/dL   Calcium 8.4 (L) 8.9 - 10.3 mg/dL   GFR calc non Af Amer NOT CALCULATED >60 mL/min   GFR calc Af Amer NOT CALCULATED >60 mL/min    Comment: (NOTE) The eGFR has been calculated using the CKD EPI equation. This calculation has not been validated in all clinical situations. eGFR's persistently <60 mL/min signify possible Chronic  Kidney Disease.    Anion gap 7 5 - 15    Comment: Performed at [facility], [place] [address], [place], [place] [address]  CBC     Status: Abnormal   Collection Time: 01/11/18  3:07 PM  Result Value Ref Range   WBC 6.5 4.0 - 10.5 K/uL   RBC 4.45 4.22 - 5.81 MIL/uL   Hemoglobin 13.1 13.0 - 17.0 g/dL   HCT 41.4 39.0 - 52.0 %   MCV 93.0 78.0 - 100.0 fL   MCH 29.4 26.0 - 34.0 pg   MCHC 31.6 30.0 - 36.0 g/dL   RDW 13.7 11.5 - 15.5 %   Platelets 137 (L) 150 - 400 K/uL    Comment: Performed at [facility], [place] [address] [place], [place] [address]  MRSA PCR Screening     Status: None   Collection Time: 01/11/18  4:19 PM  Result Value Ref Range   MRSA by PCR NEGATIVE NEGATIVE    Comment:        The GeneXpert MRSA Assay (FDA approved for NASAL specimens only), is one component of a comprehensive MRSA colonization surveillance program. It is not intended to diagnose MRSA infection nor to guide or monitor treatment for MRSA infections. Performed at [facility], [place] [address]  [place], [place] [address]   CBC     Status: Abnormal   Collection Time: 01/13/18  9:37 AM  Result Value Ref Range   WBC 3.6 (L) 4.0 - 10.5 K/uL   RBC 4.69 4.22 - 5.81 MIL/uL   Hemoglobin 14.0 13.0 - 17.0 g/dL   HCT 45.0 39.0 - 52.0 %   MCV 95.9 78.0 - 100.0 fL   MCH 29.9 26.0 - 34.0 pg   MCHC 31.1 30.0 - 36.0 g/dL   RDW 13.9 11.5 - 15.5 %   Platelets 137 (L) 150 - 400 K/uL    Comment: Performed at [facility], [place] [address], [place], [place] [address]  Comprehensive metabolic panel     Status: Abnormal   Collection Time: 01/13/18  9:37 AM  Result Value Ref Range   Sodium 141 135 - 145 mmol/L   Potassium 4.2 3.5 - 5.1 mmol/L   Chloride 100 (L) 101 - 111 mmol/L   CO2 33 (H) 22 - 32 mmol/L   Glucose, Bld 121 (H) 65 - 99 mg/dL   BUN <5 (L) 6 - 20 mg/dL   Creatinine, Ser <0.30 (L) 0.61 - 1.24 mg/dL   Calcium 8.7 (L) 8.9 -  10.3 mg/dL   Total Protein 5.9 (L) 6.5 - 8.1 g/dL   Albumin 3.4 (L) 3.5 - 5.0 g/dL   AST 25 15 - 41 U/L   ALT 27 17 - 63 U/L   Alkaline Phosphatase 58 38 - 126 U/L   Total Bilirubin 1.0 0.3 - 1.2 mg/dL   GFR calc non Af Amer NOT CALCULATED >60 mL/min   GFR calc Af Amer NOT CALCULATED >60 mL/min    Comment: (NOTE) The eGFR has been calculated using the CKD EPI equation. This calculation has not been validated in all clinical situations. eGFR's persistently <60 mL/min signify possible Chronic Kidney Disease.    Anion gap 8 5 - 15    Comment: Performed at [facility], [place] [address] [place], [place] [address]  Prealbumin     Status: Abnormal   Collection Time: 01/13/18  9:37 AM  Result Value Ref Range   Prealbumin 9.7 (L) 18 - 38 mg/dL    Comment: Performed at [place] [address], [place], [place] [address]  Magnesium     Status: None   Collection Time: 01/13/18  9:37 AM  Result Value Ref Range   Magnesium 1.7 1.7 - 2.4 mg/dL    Comment: Performed at [facility], [place] [address] [place], [place] [address]  TSH     Status: None   Collection Time: 01/13/18  9:37 AM  Result Value Ref Range   TSH 4.337 0.350 - 4.500 uIU/mL    Comment: Performed by a 3rd Generation assay with a functional sensitivity of <=0.01 uIU/mL. Performed at [facility], [place] [address], [place], [place] [address]   D-dimer, quantitative (not at [facility])     Status: None   Collection Time: 01/13/18  9:37 AM  Result Value Ref Range   D-Dimer, Quant 0.27 0.00 - 0.50 ug/mL-FEU    Comment: (NOTE) At the manufacturer cut-off of 0.50 ug/mL FEU, this assay has been documented to exclude PE with a sensitivity and negative predictive value of 97 to 99%.  At this time, this assay has not been approved by the FDA to exclude DVT/VTE. Results should be correlated with clinical presentation. Performed at [facility], [place]  [address] [place],  [place] [address]   Urinalysis, Routine w reflex microscopic     Status: None   Collection Time: 01/13/18 12:14 PM  Result Value Ref Range   Color, Urine YELLOW YELLOW   APPearance CLEAR CLEAR   Specific Gravity, Urine 1.008 1.005 - 1.030   pH 8.0 5.0 - 8.0   Glucose, UA NEGATIVE NEGATIVE mg/dL   Hgb urine dipstick NEGATIVE NEGATIVE   Bilirubin Urine NEGATIVE NEGATIVE   Ketones, ur NEGATIVE NEGATIVE mg/dL   Protein, ur NEGATIVE NEGATIVE mg/dL   Nitrite NEGATIVE NEGATIVE   Leukocytes, UA NEGATIVE NEGATIVE    Comment: Performed at [place] [address], [place], [place] [address]    Studies/Results: Dg Abd Portable 1v  Result Date: 01/13/2018 CLINICAL DATA:  History of sigmoid volvulus EXAM: PORTABLE ABDOMEN - 1 VIEW COMPARISON:  01/11/2017 FINDINGS: Scattered large and small bowel gas is noted. The previously administered contrast progresses through the colon. No obstructive changes are seen. The rectal tube has been removed. No free air is noted. IMPRESSION: Persistent contrast within the colon which has progressed somewhat distally. The previously seen rectal tube has been removed. Electronically Signed   By: Inez Catalina M.D.   On: 01/13/2018 09:20   Dg Abd Portable 2v  Result Date: 01/11/2018 CLINICAL DATA:  Follow-up sigmoid volvulus EXAM: PORTABLE ABDOMEN - 2 VIEW COMPARISON:  01/10/2018 FINDINGS: Rectal tube is now seen. The degree of colonic dilatation has improved significantly. Contrast material is noted within the proximal right colon from the recent CT examination. Chronic bony changes are noted. No free air is seen. IMPRESSION: Large rectal tube in place. Significant decompression of the colon when compared with the prior exam. Electronically Signed   By: Inez Catalina M.D.   On: 01/11/2018 17:00    Medications: I have reviewed the patient's current medications.  Assessment: 1.Sigmoid volvulus, status post decompression with  flexible sigmoidoscopy, rectal tube placement and subsequent removal yesterday. Doing well, tolerating clear liquid diet, started on MiraLAX. 2.malnutrition, albumin 3.4, prealbumin 9.7.  Plan: Recommend advancing diet to full liquid today evening and to regular diet tomorrow a.m.. If needed MiraLAX to be taken twice a day. GI will sign off, please recall if needed.   Ronnette Juniper 01/13/2018, 12:51 PM   Pager [phone] If no answer or after 5 PM call [phone]

## 2018-01-13 NOTE — Progress Notes (Signed)
  Echocardiogram 2D Echocardiogram has been performed.  Peter Hayes 01/13/2018, 4:34 PM

## 2018-01-14 DIAGNOSIS — I959 Hypotension, unspecified: Secondary | ICD-10-CM

## 2018-01-14 DIAGNOSIS — R000 Tachycardia, unspecified: Secondary | ICD-10-CM

## 2018-01-14 LAB — CORTISOL: Cortisol, Plasma: 14.9 ug/dL

## 2018-01-14 MED ORDER — PSYLLIUM 95 % PO PACK
1.0000 | PACK | Freq: Every day | ORAL | Status: DC
  Administered 2018-01-14 – 2018-01-15 (×2): 1 via ORAL
  Filled 2018-01-14 (×2): qty 1

## 2018-01-14 NOTE — Discharge Summary (Signed)
Physician Discharge Summary  Patient ID: Peter Hayes MRN: [id number] DOB/AGE: 01-17-1996 22 y.o.  Admit date: 01/09/2018 Discharge date: 01/15/2018  Admission Diagnoses:  Sigmoid volvulus Muscular dystrophy Gross underdevelopment/contractures of the lower extremities Scoliosis Malnutrition Tachycardia  Discharge Diagnoses:  Same  Active Problems:   Sinus tachycardia   Sigmoid volvulus (HCC)   Intestinal volvulus (HCC)   Hypotension   PROCEDURES:  Flexible Sigmoidoscopy with decompression, placement of rectal tube 01/10/18, Dr. Orest Dikes Course:  Pt is a 22 yo M with muscular dystrophy who presents to the ED with intermittent severe abdominal pain.  The pain has been occurring for around 18 hours with waves of worsening and lessening.  It is principally in the lower abdomen.  He denies nausea/vomiting/fever/chills.  He has not had any urinary symptoms.  He denies constipation, but his dad thinks he did not have a BM yesterday.  He has had similar symptoms in the past year, but they resolved on their own.  He feels a little better with rubbing of his abdomen.  He is not passing gas at this point, and thinks it was over 24 hours since he did that.  He is accompanied by his parents and sister.   Pt was seen in the ED by Dr. Barry Dienes and admitted.  He was seen in consult by GI, Dr. Laurence Spates, and later taken to the Endoscopy suite for above procedure.  He did well with this, and was placed in Step down unit.  He was kept on IV fluids and bowel rest.  As bowel function returned he was started on clear liquids 01/12/18 after the rectal tube was removed.  His diet was advanced and he was started on a soft diet on 01/14/18.  He had a tachycardia that was treated with BB.  This persisted in spite of BB.  He was seen by Medicine and Dr. Sheran Fava. He was given additional IV fluids.  His diet was advanced.  Nutrition saw pt and assisted family with diet choices.  His bowel function returned and he  was ready for discharge on 01/15/18.  We did send him home on Miralax which was GI's preference.  I talked to the family about hydration, and nutrition need as recommended by the Dietician.  He will follow up with GI and his family physician.   CBC Latest Ref Rng & Units 01/15/2018 01/13/2018 01/11/2018  WBC 4.0 - 10.5 K/uL 5.0 3.6(L) 6.5  Hemoglobin 13.0 - 17.0 g/dL 13.8 14.0 13.1  Hematocrit 39.0 - 52.0 % 43.4 45.0 41.4  Platelets 150 - 400 K/uL 134(L) 137(L) 137(L)   CMP Latest Ref Rng & Units 01/15/2018 01/13/2018 01/11/2018  Glucose 65 - 99 mg/dL 98 121(H) 119(H)  BUN 6 - 20 mg/dL 5(L) <5(L) <5(L)  Creatinine 0.61 - 1.24 mg/dL <0.30(L) <0.30(L) <0.30(L)  Sodium 135 - 145 mmol/L 137 141 136  Potassium 3.5 - 5.1 mmol/L 4.3 4.2 3.6  Chloride 101 - 111 mmol/L 101 100(L) 94(L)  CO2 22 - 32 mmol/L 28 33(H) 35(H)  Calcium 8.9 - 10.3 mg/dL 8.6(L) 8.7(L) 8.4(L)  Total Protein 6.5 - 8.1 g/dL - 5.9(L) -  Total Bilirubin 0.3 - 1.2 mg/dL - 1.0 -  Alkaline Phos 38 - 126 U/L - 58 -  AST 15 - 41 U/L - 25 -  ALT 17 - 63 U/L - 27 -   ABD film 01/13/18:Scattered large and small bowel gas is noted. The previously administered contrast progresses through the colon. No obstructive changes  are seen. The rectal tube has been removed. No free air is Noted.  Disposition: Discharge home   Allergies as of 01/15/2018   No Known Allergies     Medication List    TAKE these medications   acetaminophen 325 MG tablet Commonly known as:  TYLENOL Take 2 tablets (650 mg total) by mouth every 6 (six) hours as needed for mild pain, moderate pain, fever or headache (or temp > 100).   feeding supplement (ENSURE ENLIVE) Liqd You can use whatever supplement he likes.  He needs about 1300 calories per day. 1.5 liters of fluid and 50 grams of protein per day. You can buy this at the grocery or drug store.  You do not need a prescription.   multivitamin with minerals Tabs tablet You can get him a general multivitamin for  daily use at any drug store   polyethylene glycol packet Commonly known as:  MIRALAX / GLYCOLAX Follow package instruction for daily use.  You need to have one soft bowel movement per day.  If he is not doing this call your primary care doctor.  You also need to be sure he takes in 1.5 liters of fluid per day.   He needs protein supplement daily, and needs to take in about 1300 calories per day.      Follow-up Information    Penni Bombard, PA Follow up.   Specialty:  Physician Assistant Why:  Call and let them know he is home from hospital and that info about illness is in the chart.  He had a volvulus of the colon. Contact information: [place] [address] [phone]        Laurence Spates, MD Follow up.   Specialty:  Gastroenterology Why:  call for follow up in 2-3 weeks Contact information: [address] [place] [address] [phone]        Kinsinger, Arta Bruce, MD Follow up.   Specialty:  General Surgery Why:  call for follow up as needed. Contact information: [place] [address] [phone]           Signed: Earnstine Regal 01/16/2018, 10:27 AM

## 2018-01-14 NOTE — Progress Notes (Signed)
4 Days Post-Op    CC:  Sigmoid volvulus  Subjective: He looks better this Am, tolerating full liquids with BM x 2.  His HR and BP are better and he feels good.  Objective: Vital signs in last 24 hours: Temp:  [97.7 F (36.5 C)-99 F (37.2 C)] 99 F (37.2 C) (04/08 2307) Pulse Rate:  [87-111] 90 (04/09 0800) Resp:  [13-23] 16 (04/09 0800) BP: (87-113)/(45-74) 104/57 (04/09 0800) SpO2:  [90 %-100 %] 94 % (04/09 0800) Last BM Date: 01/13/18 840 PO recorded 4250 IV 250 urine recorded BM x 1 recorded Afebrile, BP still Moderately low No labs this AM  Intake/Output from previous day: 04/08 0701 - 04/09 0700 In: 5090 [P.O.:840; I.V.:3250; IV Piggyback:1000] Out: 250 [Urine:250] Intake/Output this shift: Total I/O In: 125 [I.V.:125] Out: -   General appearance: alert, cooperative, no distress and in excellent condition considering his medical state Resp: clear to auscultation bilaterally GI: soft, non-tender; bowel sounds normal; no masses,  no organomegaly  Lab Results:  Recent Labs    01/11/18 1507 01/13/18 0937  WBC 6.5 3.6*  HGB 13.1 14.0  HCT 41.4 45.0  PLT 137* 137*    BMET Recent Labs    01/11/18 1507 01/13/18 0937  NA 136 141  K 3.6 4.2  CL 94* 100*  CO2 35* 33*  GLUCOSE 119* 121*  BUN <5* <5*  CREATININE <0.30* <0.30*  CALCIUM 8.4* 8.7*   PT/INR No results for input(s): LABPROT, INR in the last 72 hours.  Recent Labs  Lab 01/09/18 2315 01/13/18 0937  AST 35 25  ALT 25 27  ALKPHOS 59 58  BILITOT 0.7 1.0  PROT 7.1 5.9*  ALBUMIN 4.3 3.4*     Lipase     Component Value Date/Time   LIPASE 23 01/09/2018 2315     Medications: . enoxaparin (LOVENOX) injection  30 mg Subcutaneous Q24H  . feeding supplement  1 Container Oral Q24H  . multivitamin with minerals  1 tablet Oral Daily  . polyethylene glycol  17 g Oral Daily   . dextrose 5 % and 0.45 % NaCl with KCl 20 mEq/L 125 mL/hr at 01/14/18 0700  . sodium chloride       Assessment/Plan Muscular dystrophy Gross underdevelopment and contractures of lower extremities. Significant scoliosis. Jehovah Witness - no blood products  Sigmoid volvulus Flexible sigmoidoscopy, decompression of Sigmoid volvulus, rectal tube placement 01/10/18 - Dr. Laurence Spates  Tachycardia -  rx with BB/Medicine consult Malnutrition/deconditioning - nutrition consult  FEN: Full liquids/ IVF ID: none VTE: SCD's, Lovenox Foley: none  Plan:  Soft diet, transfer to the floor.  I will decrease the fluids some, family is dumping his urine so they don['t get a very good count        LOS: 4 days    Peter Hayes 01/14/2018 [phone]

## 2018-01-14 NOTE — Progress Notes (Addendum)
[facility] PROGRESS NOTE  Peter Hayes OQH:[id number] DOB: Aug 12, 1996 DOA: 01/09/2018  PCP: Penni Bombard, PA  Brief History/Interval Summary: 22 year old male with a past medical history of muscular dystrophy who presented with severe abdominal pain and was found to have sigmoid volvulus.  He was seen by general surgery and gastroenterology and underwent endoscopic reduction on 4/5.  Postprocedure course was complicated by hypotension and tachycardia.  Medicine was consulted for assistance.  Reason for Visit: Hypotension   Subjective/Interval History: Patient feels well.  He denies any dizziness lightheadedness, nausea, vomiting, shortness of breath or chest pain.  Has been tolerating his liquid diet.  Has had bowel movements.  His father is at the bedside.  Objective:  Vital Signs  Vitals:   01/14/18 0400 01/14/18 0500 01/14/18 0600 01/14/18 0800  BP: (!) 91/59  (!) 87/54 (!) 104/57  Pulse: 95 89 93 90  Resp: $Remo'16 15 17 16  'AtmHh$ Temp:    97.7 F (36.5 C)  TempSrc:    Oral  SpO2: 95% 94% 97% 94%  Weight:      Height:        Intake/Output Summary (Last 24 hours) at 01/14/2018 1016 Last data filed at 01/14/2018 0943 Gross per 24 hour  Intake 5059.99 ml  Output 450 ml  Net 4609.99 ml   Filed Weights   01/11/18 0547 01/11/18 1614 01/13/18 0442  Weight: 33.2 kg (73 lb 3.1 oz) 35.2 kg (77 lb 9.6 oz) 34.7 kg (76 lb 8 oz)    General appearance: alert, cooperative and appears stated age Head: Normocephalic, without obvious abnormality, atraumatic Resp: clear to auscultation bilaterally Cardio: regular rate and rhythm, S1, S2 normal, no murmur, click, rub or gallop GI: Abdomen is mildly distended.  Soft.  Bowel sounds are present.  No masses organomegaly.  Nontender.   Lab Results:  Data Reviewed: I have personally reviewed following labs and imaging studies  CBC: Recent Labs  Lab 01/09/18 2315 01/10/18 0708 01/11/18 0454 01/11/18 1507 01/13/18 0937  WBC 6.0 5.4  14.7* 6.5 3.6*  NEUTROABS 4.5  --   --   --   --   HGB 14.5 13.0 14.8 13.1 14.0  HCT 44.5 42.2 49.5 41.4 45.0  MCV 91.8 95.0 97.4 93.0 95.9  PLT 153 151 233 137* 137*    Basic Metabolic Panel: Recent Labs  Lab 01/09/18 2315 01/10/18 0708 01/11/18 0454 01/11/18 1507 01/13/18 0937  NA 135  --  135 136 141  K 3.8  --  5.3* 3.6 4.2  CL 99*  --  94* 94* 100*  CO2 27  --  35* 35* 33*  GLUCOSE 125*  --  235* 119* 121*  BUN 6  --  <5* <5* <5*  CREATININE <0.30* <0.30* <0.30* <0.30* <0.30*  CALCIUM 8.6*  --  8.7* 8.4* 8.7*  MG  --   --   --   --  1.7    GFR: CrCl cannot be calculated (This lab value cannot be used to calculate CrCl because it is not a number: <0.30).  Liver Function Tests: Recent Labs  Lab 01/09/18 2315 01/13/18 0937  AST 35 25  ALT 25 27  ALKPHOS 59 58  BILITOT 0.7 1.0  PROT 7.1 5.9*  ALBUMIN 4.3 3.4*    Recent Labs  Lab 01/09/18 2315  LIPASE 23    CBG: Recent Labs  Lab 01/11/18 0852 01/11/18 0936 01/11/18 1037  GLUCAP 146* 132* 91    Thyroid Function Tests: Recent Labs  01/13/18 0937  TSH 4.337    Recent Results (from the past 240 hour(s))  MRSA PCR Screening     Status: None   Collection Time: 01/11/18  4:19 PM  Result Value Ref Range Status   MRSA by PCR NEGATIVE NEGATIVE Final    Comment:        The GeneXpert MRSA Assay (FDA approved for NASAL specimens only), is one component of a comprehensive MRSA colonization surveillance program. It is not intended to diagnose MRSA infection nor to guide or monitor treatment for MRSA infections. Performed at [facility], [place] [address], [place], [place] [address]       Radiology Studies: Dg Abd Portable 1v  Result Date: 01/13/2018 CLINICAL DATA:  History of sigmoid volvulus EXAM: PORTABLE ABDOMEN - 1 VIEW COMPARISON:  01/11/2017 FINDINGS: Scattered large and small bowel gas is noted. The previously administered contrast progresses through the colon. No  obstructive changes are seen. The rectal tube has been removed. No free air is noted. IMPRESSION: Persistent contrast within the colon which has progressed somewhat distally. The previously seen rectal tube has been removed. Electronically Signed   By: Inez Catalina M.D.   On: 01/13/2018 09:20     Medications:  Scheduled: . enoxaparin (LOVENOX) injection  30 mg Subcutaneous Q24H  . feeding supplement  1 Container Oral Q24H  . multivitamin with minerals  1 tablet Oral Daily  . polyethylene glycol  17 g Oral Daily  . psyllium  1 packet Oral Daily   Continuous: . dextrose 5 % and 0.45 % NaCl with KCl 20 mEq/L 100 mL/hr at 01/14/18 0943  . sodium chloride     NTI:[id number] **OR** acetaminophen, diphenhydrAMINE **OR** diphenhydrAMINE, lip balm, ondansetron **OR** ondansetron (ZOFRAN) IV, oxyCODONE, simethicone  Assessment/Plan:  Active Problems:   Sinus tachycardia   Sigmoid volvulus (HCC)   Intestinal volvulus (HCC)   Hypotension    Hypotension Thought to be due to a combination of acute illness, beta-blocker use and pain medications.  Patient is asymptomatic.  Workup so far has been unremarkable.  Blood pressures have improved some.  Continue IV fluids.  Cortisol level is pending.  Patient has borderline blood pressures at baseline with systolics in the 195-093 over 70s.  Sinus tachycardia This is chronic.  Has been worked up for the same previously.  TSH and d-dimer normal.  Echocardiogram was done and does not show any acute findings.  Normal systolic function was noted.  History of muscular dystrophy Stable.  Sigmoid volvulus status post endoscopic reduction Per general surgery and gastroenterology.  ADDENDUM: Cortisol is normal. No further work up anticipated. BP have improved.  [facility] will continue to follow the patient on a daily basis while he is hospitalized.    LOS: 4 days   [facility] Pager [phone] 01/14/2018, 10:16 AM  If 7PM-7AM,  please contact night-coverage at [email or web address], password [facility]

## 2018-01-15 LAB — BASIC METABOLIC PANEL
Anion gap: 8 (ref 5–15)
BUN: 5 mg/dL — AB (ref 6–20)
CO2: 28 mmol/L (ref 22–32)
Calcium: 8.6 mg/dL — ABNORMAL LOW (ref 8.9–10.3)
Chloride: 101 mmol/L (ref 101–111)
Creatinine, Ser: <0.3 mg/dL — ABNORMAL LOW (ref 0.61–1.24)
GLUCOSE: 98 mg/dL (ref 65–99)
POTASSIUM: 4.3 mmol/L (ref 3.5–5.1)
Sodium: 137 mmol/L (ref 135–145)

## 2018-01-15 LAB — CBC
HCT: 43.4 % (ref 39.0–52.0)
Hemoglobin: 13.8 g/dL (ref 13.0–17.0)
MCH: 29.4 pg (ref 26.0–34.0)
MCHC: 31.8 g/dL (ref 30.0–36.0)
MCV: 92.5 fL (ref 78.0–100.0)
PLATELETS: 134 10*3/uL — AB (ref 150–400)
RBC: 4.69 MIL/uL (ref 4.22–5.81)
RDW: 13.7 % (ref 11.5–15.5)
WBC: 5 10*3/uL (ref 4.0–10.5)

## 2018-01-15 MED ORDER — POLYETHYLENE GLYCOL 3350 17 G PO PACK: PACK | ORAL | 0 refills | Status: AC

## 2018-01-15 MED ORDER — ENSURE ENLIVE PO LIQD: ORAL | 12 refills | Status: DC

## 2018-01-15 MED ORDER — ENSURE ENLIVE PO LIQD: 237.0000 mL | Freq: Three times a day (TID) | ORAL | Status: DC

## 2018-01-15 MED ORDER — ACETAMINOPHEN 325 MG PO TABS: 650.0000 mg | ORAL_TABLET | Freq: Four times a day (QID) | ORAL | Status: DC | PRN

## 2018-01-15 MED ORDER — ADULT MULTIVITAMIN W/MINERALS CH: ORAL_TABLET | ORAL | Status: DC

## 2018-01-15 NOTE — Progress Notes (Signed)
Patient Demographics:    Peter Hayes, is a 22 y.o. male, DOB - January 15, 1996, FWY:[id number]  Admit date - 01/09/2018   Admitting Physician Md Edison Pace, MD  Outpatient Primary MD for the patient is Penni Bombard, PA  LOS - 5   Chief Complaint  Patient presents with  . Abdominal Pain        Subjective:    Peter Hayes today has no fevers, no emesis,  No chest pain, patient's mother and sister at bedside, no new complaints, eating and drinking well  Assessment  & Plan :    Active Problems:   Sinus tachycardia   Sigmoid volvulus (HCC)   Intestinal volvulus (HCC)   Hypotension    Brief History/Interval Summary:  22 year old male with a past medical history of muscular dystrophy who presented with severe abdominal pain and was found to have sigmoid volvulus.  He was seen by general surgery and gastroenterology and underwent endoscopic reduction on 01/10/18.   Postprocedure course was complicated by hypotension and tachycardia.  Medicine was consulted for assistance.      Plan:- 1)Sigmoid Volvulus- Flexible sigmoidoscopy, decompression of Sigmoid volvulus on4/5/19,   - Dr. Laurence Spates,  patient had BM, eating and drinking well, per surgical team okay to discharge  2)Sinus Tachycardia -okay to Rx with BB - chronic issue, with normal EF, and labs Malnutrition/deconditioning- nutrition consult, cortisol level is normal.TSH and d-dimer normal.  Echocardiogram was done and does not show any acute findings.  Normal systolic function was noted.  3)History of Muscular Dystrophy- Gross underdevelopment and contractures of lower extremities. Significant scoliosis.   4)Social/Ethics------ Jehovah Witness - no blood products  Disposition Plan  : Home as per surgical team    Lab Results  Component Value Date   PLT 134 (L) 01/15/2018    Inpatient Medications  Scheduled Meds: . enoxaparin (LOVENOX)  injection  30 mg Subcutaneous Q24H  . feeding supplement  1 Container Oral Q24H  . feeding supplement (ENSURE ENLIVE)  237 mL Oral TID BM  . multivitamin with minerals  1 tablet Oral Daily  . polyethylene glycol  17 g Oral Daily  . psyllium  1 packet Oral Daily   Continuous Infusions: . dextrose 5 % and 0.45 % NaCl with KCl 20 mEq/L 100 mL/hr at 01/15/18 0600  . sodium chloride     PRN Meds:.acetaminophen **OR** acetaminophen, diphenhydrAMINE **OR** diphenhydrAMINE, lip balm, ondansetron **OR** ondansetron (ZOFRAN) IV, oxyCODONE, simethicone    Anti-infectives (From admission, onward)   None        Objective:   Vitals:   01/14/18 1000 01/14/18 1210 01/14/18 2118 01/15/18 0518  BP: 100/62 115/70 111/65 109/75  Pulse: 98 (!) 105 99 96  Resp: (!) $RemoveB'22 19 18 16  'PrmdZZds$ Temp:  97.9 F (36.6 C) 98.3 F (36.8 C) 97.8 F (36.6 C)  TempSrc:  Oral Oral Oral  SpO2: 96% 97% 99% 98%  Weight:      Height:        Wt Readings from Last 3 Encounters:  01/13/18 34.7 kg (76 lb 8 oz)  12/05/17 35.8 kg (79 lb)  12/03/17 35.9 kg (79 lb 1.6 oz)     Intake/Output Summary (Last 24 hours) at 01/15/2018 1136 Last data filed at 01/15/2018 0600  Gross per 24 hour  Intake 1905 ml  Output 100 ml  Net 1805 ml     Physical Exam  Gen:- Awake Alert, neuromuscular deficits system with muscular dystrophy HEENT:- Crouch.AT, No sclera icterus Neck-Supple Neck,No JVD,.  Lungs-  CTAB , good air movement CV- S1, S2 normal Abd-  +ve B.Sounds, Abd Soft, No tenderness,    Extremity/Skin:- No  edema, good pulses Psych-affect is appropriate, oriented x3 Neuro-chronic neuromuscular deficits consistent with muscular dystrophy   Data Review:   Micro Results Recent Results (from the past 240 hour(s))  MRSA PCR Screening     Status: None   Collection Time: 01/11/18  4:19 PM  Result Value Ref Range Status   MRSA by PCR NEGATIVE NEGATIVE Final    Comment:        The GeneXpert MRSA Assay (FDA approved for NASAL  specimens only), is one component of a comprehensive MRSA colonization surveillance program. It is not intended to diagnose MRSA infection nor to guide or monitor treatment for MRSA infections. Performed at [facility], [place] [address], [place], [place] [address]     Radiology Reports Ct Abdomen Pelvis W Contrast  Result Date: 01/10/2018 CLINICAL DATA:  Acute abdominal pain. History of muscular dystrophy. EXAM: CT ABDOMEN AND PELVIS WITH CONTRAST TECHNIQUE: Multidetector CT imaging of the abdomen and pelvis was performed using the standard protocol following bolus administration of intravenous contrast. CONTRAST:  183mL ISOVUE-300 IOPAMIDOL (ISOVUE-300) INJECTION 61% COMPARISON:  CT abdomen pelvis 09/23/2017 FINDINGS: LOWER CHEST: No basilar pulmonary nodules or pleural effusion. No apical pericardial effusion. HEPATOBILIARY: Normal hepatic contours and density. No intra- or extrahepatic biliary dilatation. Normal gallbladder. PANCREAS: Normal parenchymal contours without ductal dilatation. No peripancreatic fluid collection. SPLEEN: Normal. ADRENALS/URINARY TRACT: --Adrenal glands: Normal. --Right kidney/ureter: No hydronephrosis, nephroureterolithiasis, perinephric stranding or solid renal mass. --Left kidney/ureter: No hydronephrosis, nephroureterolithiasis, perinephric stranding or solid renal mass. --Urinary bladder: Moderate wall thickening. STOMACH/BOWEL: --Stomach/Duodenum: No hiatal hernia or other gastric abnormality. Normal duodenal course. --Small bowel: No dilatation or inflammation. --Colon: There is gaseous dilatation of the transverse colon. No colonic inflammation. --Appendix: Not visualized. No right lower quadrant inflammation or free fluid. VASCULAR/LYMPHATIC: There is swirling of the mesenteric vessels, most notable at the SMV, just below the porta splenic confluence (series 2 images 38-40. There is a second area of mesenteric vascular swirling in the midline low  anterior abdomen. These findings are new from the studies of 09/23/2017 and 05/05/2016. No abdominal or pelvic lymphadenopathy. REPRODUCTIVE: No free fluid in the pelvis. MUSCULOSKELETAL. Diffuse severe muscular atrophy is compatible with reported muscular dystrophy. 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 OTHER: None. IMPRESSION: 1. Swirling of the mesenteric vessels surrounding the proximal superior mesenteric vein and within the inferior anterior midline abdomen. This appearance is concerning for intermittent volvulus. There is no evidence of bowel ischemia or obstruction. 2. Dilated and gas-filled transverse colon without evidence of colonic obstruction. 3. Diffuse muscular atrophy, consistent with muscular dystrophy. 4. Moderate thickening of the urinary bladder may indicate chronic obstruction, neurogenic bladder or cystitis. Electronically Signed   By: Ulyses Jarred M.D.   On: 01/10/2018 01:34   Dg Abd Portable 1v  Result Date: 01/13/2018 CLINICAL DATA:  History of sigmoid volvulus EXAM: PORTABLE ABDOMEN - 1 VIEW COMPARISON:  01/11/2017 FINDINGS: Scattered large and small bowel gas is noted. The previously administered contrast progresses through the colon. No obstructive changes are seen. The rectal tube has been removed. No free air is noted. IMPRESSION: Persistent contrast within the colon which  has progressed somewhat distally. The previously seen rectal tube has been removed. Electronically Signed   By: Inez Catalina M.D.   On: 01/13/2018 09:20   Dg Abd Portable 2v  Result Date: 01/11/2018 CLINICAL DATA:  Follow-up sigmoid volvulus EXAM: PORTABLE ABDOMEN - 2 VIEW COMPARISON:  01/10/2018 FINDINGS: Rectal tube is now seen. The degree of colonic dilatation has improved significantly. Contrast material is noted within the proximal right colon from the recent CT examination. Chronic bony changes are noted. No free air is seen. IMPRESSION: Large rectal tube in place. Significant decompression of the colon  when compared with the prior exam. Electronically Signed   By: Inez Catalina M.D.   On: 01/11/2018 17:00     CBC Recent Labs  Lab 01/09/18 2315 01/10/18 0708 01/11/18 0454 01/11/18 1507 01/13/18 0937 01/15/18 0510  WBC 6.0 5.4 14.7* 6.5 3.6* 5.0  HGB 14.5 13.0 14.8 13.1 14.0 13.8  HCT 44.5 42.2 49.5 41.4 45.0 43.4  PLT 153 151 233 137* 137* 134*  MCV 91.8 95.0 97.4 93.0 95.9 92.5  MCH 29.9 29.3 29.1 29.4 29.9 29.4  MCHC 32.6 30.8 29.9* 31.6 31.1 31.8  RDW 14.1 14.0 13.9 13.7 13.9 13.7  LYMPHSABS 0.9  --   --   --   --   --   MONOABS 0.7  --   --   --   --   --   EOSABS 0.0  --   --   --   --   --   BASOSABS 0.0  --   --   --   --   --     Chemistries  Recent Labs  Lab 01/09/18 2315 01/10/18 0708 01/11/18 0454 01/11/18 1507 01/13/18 0937 01/15/18 0510  NA 135  --  135 136 141 137  K 3.8  --  5.3* 3.6 4.2 4.3  CL 99*  --  94* 94* 100* 101  CO2 27  --  35* 35* 33* 28  GLUCOSE 125*  --  235* 119* 121* 98  BUN 6  --  <5* <5* <5* 5*  CREATININE <0.30* <0.30* <0.30* <0.30* <0.30* <0.30*  CALCIUM 8.6*  --  8.7* 8.4* 8.7* 8.6*  MG  --   --   --   --  1.7  --   AST 35  --   --   --  25  --   ALT 25  --   --   --  27  --   ALKPHOS 59  --   --   --  58  --   BILITOT 0.7  --   --   --  1.0  --    ------------------------------------------------------------------------------------------------------------------ No results for input(s): CHOL, HDL, LDLCALC, TRIG, CHOLHDL, LDLDIRECT in the last 72 hours.  No results found for: HGBA1C ------------------------------------------------------------------------------------------------------------------ Recent Labs    01/13/18 0937  TSH 4.337   ------------------------------------------------------------------------------------------------------------------ No results for input(s): VITAMINB12, FOLATE, FERRITIN, TIBC, IRON, RETICCTPCT in the last 72 hours.  Coagulation profile No results for input(s): INR, PROTIME in the last 168  hours.  Recent Labs    01/13/18 0937  DDIMER 0.27    Cardiac Enzymes No results for input(s): CKMB, TROPONINI, MYOGLOBIN in the last 168 hours.  Invalid input(s): CK ------------------------------------------------------------------------------------------------------------------ No results found for: BNP   Roxan Hockey M.D on 01/15/2018 at 11:36 AM  Between 7am to 7pm - Pager - [phone]  After 7pm go to [email or web address] - password [email or web address]  [facility] -  Office  [phone]   Voice Recognition Viviann Spare dictation system was used to create this note, attempts have been made to correct errors. Please contact the author with questions and/or clarifications.

## 2018-01-15 NOTE — Discharge Instructions (Signed)
Volvulus Volvulus is an abnormal twisting of a portion of your digestive tract. Your digestive tract consists of your swallowing tube (esophagus), followed by your stomach, small intestine, and large intestine. This twisting can block the flow of digestion (intestinal obstruction). It can also block the blood flow to the part of the digestive tract that is twisted. Lack of blood flow can cause the twisted part of the digestive tract to die. Volvulus is a medical emergency. There are various types of volvulus:  Sigmoid volvulus is a twisting of the last part of your large intestine. This is the most common type.  Midgut volvulus usually occurs in children who are born with an abnormally positioned small intestine (malrotation).  Cecal volvulus may be caused by scar tissue from previous abdominal surgery.  Gastric volvulus is a rare type of volvulus that occurs when the stomach twists around itself.  What are the causes? Volvulus may be caused by many different things. It can be something you are born with (congenital deformity), or it may be a problem that develops from another condition. What increases the risk? You may have a greater risk of volvulus if you:  Are 76 years of age or older.  Have long-standing (chronic) constipation.  Have part of your stomach located above the area where the stomach and esophagus meet (paraesophageal hernia).  Are bedridden.  Have had previous abdominal surgery.  Live in a long-term care facility.  What are the signs or symptoms? Signs and symptoms of most types of volvulus include:  Abdominal pain. ? Sigmoid volvulus may cause pain in the lower left part of the abdomen. ? Cecal volvulus may cause pain in the lower right part of the abdomen. ? Gastric and midgut volvulus may cause pain in the upper abdomen.  Bloating and swelling of the abdomen.  Decreased passing of gas or inability to pass  gas.  Nausea.  Vomiting.  Constipation.  Tenderness when pressing on the abdomen.  As the condition gets worse, the volvulus can develop a hole (perforation) and leak digestive contents into the abdomen. This can cause late symptoms of volvulus, including:  Severe infection (sepsis).  Bleeding into the abdomen.  Very low blood pressure (shock).  How is this diagnosed? Your health care provider may suspect volvulus if you have sudden signs and symptoms of intestinal obstruction. A physical exam will be done. The health care provider will listen to your abdomen for the sounds of digestion and will feel your abdomen for tenderness. Imaging studies of your abdomen may also be done. These may include:  CT scan. This is the best imaging study for diagnosing volvulus.  Plain X-rays. These may show air and fluid levels and widening above the obstruction.  Ultrasound.  How is this treated? Volvulus is almost always a medical emergency requiring immediate surgery. A surgeon may attempt to do a procedure to untwist the volvulus if possible. If the volvulus cannot be untwisted, the part of the digestive tract involved may need to be removed. This information is not intended to replace advice given to you by your health care provider. Make sure you discuss any questions you have with your health care provider. Document Released: 06/19/2001 Document Revised: 03/01/2016 Document Reviewed: 06/09/2014 Elsevier Interactive Patient Education  2018 [employer].   Regular diet, Supplements with protein.  He needs 1.5 liters of fluid per day, 1300 calories per day and 50 grams of protein per day.

## 2018-01-15 NOTE — Progress Notes (Signed)
Nutrition Brief Note  RD consulted for diet education.   Spoke with patient and pt's sister at bedside. Mother was asleep snad didn't seem to want to participate in education. Recommended patient resume normal diet after following "soft or lower fiber" diet after 10 days. Incorporating fibrous foods slowly after a low fiber diet and drinking plenty of fluids to help prevent any constipation(which patient is at higher risk of given limited mobility).   Labs and medications reviewed.   Patient expected to discharge today.  Clayton Bibles, MS, RD, [facility] Pager: [phone] After Hours Pager: [phone]

## 2018-01-15 NOTE — Progress Notes (Signed)
5 Days Post-Op    CC:  volvulus  Subjective: He is happy, his mother and sister are in the room with him today. He tolerated the diet well yesterday and had BM.  His mother feels safe taking him home.    Objective: Vital signs in last 24 hours: Temp:  [97.8 F (36.6 C)-98.3 F (36.8 C)] 97.8 F (36.6 C) (04/10 0518) Pulse Rate:  [96-105] 96 (04/10 0518) Resp:  [16-22] 16 (04/10 0518) BP: (100-115)/(62-75) 109/75 (04/10 0518) SpO2:  [96 %-99 %] 98 % (04/10 0518) Last BM Date: 01/14/18 120 PO recorded 2255 IV Urine x 2 recorded Stool x 2 recorded Afebrile, VSS Labs OK   Intake/Output from previous day: 04/09 0701 - 04/10 0700 In: 2375 [P.O.:120; I.V.:2255] Out: 300 [Urine:300] Intake/Output this shift: No intake/output data recorded.  General appearance: alert, cooperative and no distress Resp: clear to auscultation bilaterally GI: soft, non-tender; bowel sounds normal; no masses,  no organomegaly  Lab Results:  Recent Labs    01/13/18 0937 01/15/18 0510  WBC 3.6* 5.0  HGB 14.0 13.8  HCT 45.0 43.4  PLT 137* 134*    BMET Recent Labs    01/13/18 0937 01/15/18 0510  NA 141 137  K 4.2 4.3  CL 100* 101  CO2 33* 28  GLUCOSE 121* 98  BUN <5* 5*  CREATININE <0.30* <0.30*  CALCIUM 8.7* 8.6*   PT/INR No results for input(s): LABPROT, INR in the last 72 hours.  Recent Labs  Lab 01/09/18 2315 01/13/18 0937  AST 35 25  ALT 25 27  ALKPHOS 59 58  BILITOT 0.7 1.0  PROT 7.1 5.9*  ALBUMIN 4.3 3.4*     Lipase     Component Value Date/Time   LIPASE 23 01/09/2018 2315     Medications: . enoxaparin (LOVENOX) injection  30 mg Subcutaneous Q24H  . feeding supplement  1 Container Oral Q24H  . multivitamin with minerals  1 tablet Oral Daily  . polyethylene glycol  17 g Oral Daily  . psyllium  1 packet Oral Daily    Assessment/Plan Muscular dystrophy Gross underdevelopment and contractures of lower extremities. Significant scoliosis. Jehovah Witness  - no blood products  Sigmoid volvulus Flexible sigmoidoscopy, decompression of Sigmoid volvulus, rectal tube placement 01/10/18 - Dr. Laurence Spates  Tachycardia - rx with BB/Medicine consult - chronic issue, with normal EF, and labs Malnutrition/deconditioning- nutrition consult  HUD:JSHF liquids/IVF ID: none VTE: SCD's,Lovenox Foley: none  Plan:  Home today, encourage adequate PO intake at home and add fiber.      LOS: 5 days    Khang Hannum 01/15/2018 [phone]

## 2018-05-15 ENCOUNTER — Encounter (HOSPITAL_COMMUNITY): Payer: Self-pay | Admitting: Emergency Medicine

## 2018-05-15 ENCOUNTER — Emergency Department (HOSPITAL_COMMUNITY): Payer: Managed Care, Other (non HMO)

## 2018-05-15 ENCOUNTER — Inpatient Hospital Stay (HOSPITAL_COMMUNITY)
Admission: EM | Admit: 2018-05-15 | Discharge: 2018-05-20 | DRG: 091 | Disposition: A | Discharge status: Home / Self Care | Payer: Managed Care, Other (non HMO) | Attending: Family Medicine | Admitting: Family Medicine

## 2018-05-15 ENCOUNTER — Other Ambulatory Visit: Payer: Self-pay

## 2018-05-15 DIAGNOSIS — Z681 Body mass index (BMI) 19 or less, adult: Secondary | ICD-10-CM

## 2018-05-15 DIAGNOSIS — E872 Acidosis: Secondary | ICD-10-CM | POA: Diagnosis present

## 2018-05-15 DIAGNOSIS — J9601 Acute respiratory failure with hypoxia: Secondary | ICD-10-CM | POA: Diagnosis present

## 2018-05-15 DIAGNOSIS — I959 Hypotension, unspecified: Secondary | ICD-10-CM | POA: Diagnosis present

## 2018-05-15 DIAGNOSIS — R1084 Generalized abdominal pain: Secondary | ICD-10-CM | POA: Diagnosis not present

## 2018-05-15 DIAGNOSIS — R0902 Hypoxemia: Secondary | ICD-10-CM

## 2018-05-15 DIAGNOSIS — Z79899 Other long term (current) drug therapy: Secondary | ICD-10-CM

## 2018-05-15 DIAGNOSIS — R64 Cachexia: Secondary | ICD-10-CM | POA: Diagnosis present

## 2018-05-15 DIAGNOSIS — G7101 Duchenne or Becker muscular dystrophy: Secondary | ICD-10-CM | POA: Diagnosis not present

## 2018-05-15 DIAGNOSIS — K567 Ileus, unspecified: Secondary | ICD-10-CM | POA: Diagnosis present

## 2018-05-15 DIAGNOSIS — E861 Hypovolemia: Secondary | ICD-10-CM | POA: Diagnosis present

## 2018-05-15 DIAGNOSIS — I313 Pericardial effusion (noninflammatory): Secondary | ICD-10-CM | POA: Diagnosis present

## 2018-05-15 DIAGNOSIS — Z993 Dependence on wheelchair: Secondary | ICD-10-CM

## 2018-05-15 DIAGNOSIS — J9602 Acute respiratory failure with hypercapnia: Secondary | ICD-10-CM

## 2018-05-15 DIAGNOSIS — J9621 Acute and chronic respiratory failure with hypoxia: Secondary | ICD-10-CM | POA: Diagnosis present

## 2018-05-15 DIAGNOSIS — J90 Pleural effusion, not elsewhere classified: Secondary | ICD-10-CM | POA: Diagnosis present

## 2018-05-15 DIAGNOSIS — J9622 Acute and chronic respiratory failure with hypercapnia: Secondary | ICD-10-CM | POA: Diagnosis present

## 2018-05-15 DIAGNOSIS — E875 Hyperkalemia: Secondary | ICD-10-CM | POA: Diagnosis present

## 2018-05-15 DIAGNOSIS — Z885 Allergy status to narcotic agent status: Secondary | ICD-10-CM

## 2018-05-15 LAB — CBC WITH DIFFERENTIAL/PLATELET
BASOS ABS: 0 10*3/uL (ref 0.0–0.1)
Basophils Relative: 0 %
EOS ABS: 0 10*3/uL (ref 0.0–0.7)
EOS PCT: 0 %
HEMATOCRIT: 53.7 % — AB (ref 39.0–52.0)
Hemoglobin: 16.6 g/dL (ref 13.0–17.0)
Lymphocytes Relative: 23 %
Lymphs Abs: 1.1 10*3/uL (ref 0.7–4.0)
MCH: 28.8 pg (ref 26.0–34.0)
MCHC: 30.9 g/dL (ref 30.0–36.0)
MCV: 93.1 fL (ref 78.0–100.0)
MONOS PCT: 18 %
Monocytes Absolute: 0.9 10*3/uL (ref 0.1–1.0)
NEUTROS ABS: 2.8 10*3/uL (ref 1.7–7.7)
Neutrophils Relative %: 59 %
PLATELETS: 146 10*3/uL — AB (ref 150–400)
RBC: 5.77 MIL/uL (ref 4.22–5.81)
RDW: 14.6 % (ref 11.5–15.5)
WBC: 4.8 10*3/uL (ref 4.0–10.5)

## 2018-05-15 LAB — I-STAT CG4 LACTIC ACID, ED: LACTIC ACID, VENOUS: 1.14 mmol/L (ref 0.5–1.9)

## 2018-05-15 LAB — POC OCCULT BLOOD, ED: Fecal Occult Bld: POSITIVE — AB

## 2018-05-15 MED ORDER — MORPHINE SULFATE (PF) 4 MG/ML IV SOLN
4.0000 mg | Freq: Once | INTRAVENOUS | Status: DC
Start: 2018-05-15 — End: 2018-05-16

## 2018-05-15 MED ORDER — SODIUM CHLORIDE 0.9 % IV BOLUS
1000.0000 mL | Freq: Once | INTRAVENOUS | Status: AC
  Administered 2018-05-15: 1000 mL via INTRAVENOUS

## 2018-05-15 MED ORDER — ONDANSETRON HCL 4 MG/2ML IJ SOLN: 4.0000 mg | Freq: Once | INTRAMUSCULAR | Status: DC

## 2018-05-15 NOTE — ED Provider Notes (Signed)
[facility] DEPT Provider Note   CSN: [id number] Arrival date & time: 05/15/18  2222     History   Chief Complaint Chief Complaint  Patient presents with  . Abdominal Pain  . Back Pain    HPI Peter Hayes is a 22 y.o. male.  Patient with history of muscular dystrophy, wheelchair bound presenting with abdominal pain that has been ongoing since this afternoon.  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  He has not had any nausea or vomiting.  No fever.  Last bowel movement was this morning.  Denies any stable.  On arrival he was found to be hypoxic in the 70s but denies any shortness of breath, cough or fever.  No chest pain.  Denies any pulmonary history.  No pain with urination or blood in the urine.  The history is provided by the patient and a relative.  Abdominal Pain   Associated symptoms include nausea. Pertinent negatives include fever, vomiting, dysuria, headaches, arthralgias and myalgias.  Back Pain   Associated symptoms include abdominal pain. Pertinent negatives include no chest pain, no fever, no headaches, no dysuria and no weakness.    Past Medical History:  Diagnosis Date  . MD (muscular dystrophy) ([place])   . Refusal of blood product    patient is Peter Hayes witness    Patient Active Problem List   Diagnosis Date Noted  . Intestinal volvulus ([place])   . Hypotension   . Sigmoid volvulus ([place]) 01/10/2018  . HCAP (healthcare-associated pneumonia) 12/01/2017  . Hypoglycemia without diagnosis of diabetes mellitus 09/24/2017  . Ileus ([place]) 09/23/2017  . Sepsis ([place]) 05/05/2016  . Muscular dystrophy ([place]) 05/05/2016  . Moderate protein-calorie malnutrition ([place]) 05/05/2016  . Sinus tachycardia 05/05/2016  . Leukocytosis 05/05/2016  . High anion gap metabolic acidosis 02/72/5366  . Respiratory alkalosis 05/05/2016    Past Surgical History:  Procedure Laterality Date  . EYE SURGERY    .  FLEXIBLE SIGMOIDOSCOPY N/A 01/10/2018   Procedure: FLEXIBLE SIGMOIDOSCOPY;  Surgeon: Laurence Spates, MD;  Location: [facility] END[facility]COPY;  Service: Endoscopy;  Laterality: N/A;        Home Medications    Prior to Admission medications   Medication Sig Start Date End Date Taking? Authorizing Provider  acetaminophen (TYLENOL) 325 MG tablet Take 2 tablets (650 mg total) by mouth every 6 (six) hours as needed for mild pain, moderate pain, fever or headache (or temp > 100). 01/15/18   Earnstine Regal, PA-C  feeding supplement, ENSURE ENLIVE, (ENSURE ENLIVE) LIQD You can use whatever supplement he likes.  He needs about 1300 calories per day. 1.5 liters of fluid and 50 grams of protein per day. You can buy this at the grocery or drug store.  You do not need a prescription. 01/15/18   Earnstine Regal, PA-C  Multiple Vitamin (MULTIVITAMIN WITH MINERALS) TABS tablet You can get him a general multivitamin for daily use at any drug store 01/15/18   Earnstine Regal, PA-C  polyethylene glycol [facility] / Floria Raveling) packet Follow package instruction for daily use.  You need to have one soft bowel movement per day.  If he is not doing this call your primary care doctor.  You also need to be sure he takes in 1.5 liters of fluid per day.   He needs protein supplement daily, and needs to take in about 1300 calories per day. 01/15/18   Earnstine Regal, PA-C    Family History No family history on file.  Social History Social History   Tobacco Use  . Smoking status: Never Smoker  . Smokeless tobacco: Never Used  Substance Use Topics  . Alcohol use: No  . Drug use: No     Allergies   Patient has no known allergies.   Review of Systems Review of Systems  Constitutional: Positive for activity change and appetite change. Negative for fever.  HENT: Negative for congestion and rhinorrhea.   Eyes: Negative for visual disturbance.  Respiratory: Positive for shortness of breath. Negative for chest tightness.     Cardiovascular: Negative for chest pain.  Gastrointestinal: Positive for abdominal pain and nausea. Negative for vomiting.  Genitourinary: Negative for dysuria.  Musculoskeletal: Positive for back pain. Negative for arthralgias and myalgias.  Skin: Negative for rash.  Neurological: Negative for dizziness, weakness and headaches.   all other systems are negative except as noted in the HPI and PMH.     Physical Exam Updated Vital Signs BP (!) 131/91 (BP Location: Right Arm)   Pulse (!) 122   Temp 98.3 F (36.8 C) (Oral)   Resp 20   Wt 43.1 kg   BMI 18.55 kg/m   Physical Exam  Constitutional: He is oriented to person, place, and time. He appears well-developed and well-nourished. He appears ill. No distress.  Chronically ill appearing  HENT:  Head: Normocephalic and atraumatic.  Mouth/Throat: Oropharynx is clear and moist. No oropharyngeal exudate.  Eyes: Pupils are equal, round, and reactive to light. Conjunctivae and EOM are normal.  Neck: Normal range of motion. Neck supple.  No meningismus.  Cardiovascular: Normal rate, regular rhythm, normal heart sounds and intact distal pulses.  No murmur heard. Pulmonary/Chest: Breath sounds normal. No stridor. He is in respiratory distress. He has no wheezes.  Increased work of breathing. Decreased breath sounds.  Abdominal: Soft. He exhibits distension. There is tenderness. There is guarding. There is no rebound.  Distended abdomen, voluntary guarding throughout  Genitourinary:  Genitourinary Comments: No fecal impaction  Musculoskeletal: Normal range of motion. He exhibits deformity. He exhibits no edema or tenderness.  Muscle wasting of lower extremities.  Neurological: He is alert and oriented to person, place, and time. No cranial nerve deficit. He exhibits normal muscle tone. Coordination normal.  Chronically ill-appearing, muscle wasting, moving all extremities, no apparent focal deficit  Skin: Skin is warm.  Psychiatric: He  has a normal mood and affect. His behavior is normal.  Nursing note and vitals reviewed.    ED Treatments / Results  Labs (all labs ordered are listed, but only abnormal results are displayed) Labs Reviewed  CBC WITH DIFFERENTIAL/PLATELET - Abnormal; Notable for the following components:      Result Value   HCT 53.7 (*)    Platelets 146 (*)    All other components within normal limits  COMPREHENSIVE METABOLIC PANEL - Abnormal; Notable for the following components:   Potassium 5.6 (*)    Chloride 91 (*)    CO2 34 (*)    Creatinine, Ser <0.30 (*)    Calcium 8.6 (*)    All other components within normal limits  URINALYSIS, ROUTINE W REFLEX MICROSCOPIC - Abnormal; Notable for the following components:   Specific Gravity, Urine >1.046 (*)    All other components within normal limits  TROPONIN I - Abnormal; Notable for the following components:   Troponin I 0.03 (*)    All other components within normal limits  BLOOD GAS, ARTERIAL - Abnormal; Notable for the following components:   pH, Arterial 7.185 (*)  pCO2 arterial 113 (*)    pO2, Arterial 278 (*)    Bicarbonate 41.1 (*)    Acid-Base Excess 7.6 (*)    All other components within normal limits  BLOOD GAS, VENOUS - Abnormal; Notable for the following components:   pH, Ven 7.201 (*)    pCO2, Ven 106 (*)    pO2, Ven 61.1 (*)    Bicarbonate 39.8 (*)    Acid-Base Excess 7.2 (*)    All other components within normal limits  POC OCCULT BLOOD, ED - Abnormal; Notable for the following components:   Fecal Occult Bld POSITIVE (*)    All other components within normal limits  CULTURE, BLOOD (ROUTINE X 2)  URINE CULTURE  LIPASE, BLOOD  BRAIN NATRIURETIC PEPTIDE  RAPID URINE DRUG SCREEN, HOSP PERFORMED  D-DIMER, QUANTITATIVE (NOT AT [facility])  BLOOD GAS, ARTERIAL  PROCALCITONIN  I-STAT CG4 LACTIC ACID, ED    EKG EKG Interpretation  Date/Time:  Thursday May 15 2018 23:28:43 EDT Ventricular Rate:  119 PR Interval:    QRS  Duration: 80 QT Interval:  298 QTC Calculation: 420 R Axis:   125 Text Interpretation:  Sinus tachycardia Right axis deviation Low voltage, extremity leads Abnormal lateral Q waves Borderline ST elevation, anterior leads No significant change was found Confirmed by Ezequiel Essex [phone]) on 05/16/2018 12:12:25 AM   Radiology Dg Chest Portable 1 View  Result Date: 05/16/2018 CLINICAL DATA:  Acute onset of generalized abdominal pain and chest pain. EXAM: PORTABLE CHEST 1 VIEW COMPARISON:  Chest radiograph performed 12/05/2017 FINDINGS: The lungs are mildly hypoexpanded. Apparent vascular congestion is noted. No pleural effusion or pneumothorax is seen. There is no evidence of focal opacification, pleural effusion or pneumothorax. The cardiomediastinal silhouette is within normal limits. No acute osseous abnormalities are seen. IMPRESSION: Apparent vascular congestion noted. Lungs mildly hypoexpanded but grossly clear. Electronically Signed   By: Garald Balding M.D.   On: 05/16/2018 00:15   Dg Abd Portable 2 Views  Result Date: 05/16/2018 CLINICAL DATA:  Acute onset of generalized abdominal pain. EXAM: PORTABLE ABDOMEN - 2 VIEW COMPARISON:  Abdominal radiograph performed 01/13/2018 FINDINGS: There is diffuse distention of the colon with air, possibly reflecting mild ileus; a large amount of stool at the pelvis could reflect fecal impaction. No abnormal dilatation of small bowel loops is seen to suggest small bowel obstruction. No free intra-abdominal air is identified on the provided decubitus view, though evaluation is somewhat suboptimal due to the degree of penetration. The visualized osseous structures are within normal limits; the sacroiliac joints are unremarkable in appearance. The visualized lung bases are essentially clear. IMPRESSION: Diffuse distention of the colon with air, possibly reflecting mild ileus; a large amount of stool at the pelvis could reflect fecal impaction. No abnormal dilatation  of small bowel loops seen to suggest small bowel obstruction. No definite free intra-abdominal air seen. Electronically Signed   By: Garald Balding M.D.   On: 05/16/2018 00:16    Procedures Procedures (including critical care time)  Medications Ordered in ED Medications  sodium chloride 0.9 % bolus 1,000 mL (has no administration in time range)  morphine 4 MG/ML injection 4 mg (has no administration in time range)  ondansetron (ZOFRAN) injection 4 mg (has no administration in time range)     Initial Impression / Assessment and Plan / ED Course  I have reviewed the triage vital signs and the nursing notes.  Pertinent labs & imaging results that were available during my care of the patient  were reviewed by me and considered in my medical decision making (see chart for details).    Patient with history of volvulus presenting with abdominal pain.  Found to be hypoxic on arrival.  Saturations in the 70s improved on nasal cannula.  Breath sounds are decreased bilaterally.  Chest x-ray shows no pneumothorax.  Possibility of intestinal volvulus discussed with Dr. Marcello Moores of surgery.  She will see the patient in the morning and recommends medical admission.  No antibiotics at this time.  Patient with persistent tachycardia.  He is afebrile.  CT scan shows no pneumonia or pulmonary embolism.  CT does show large distended colon without evidence of obstruction or volvulus.  ABG was obtained to evaluate patient's hypoxia.  Does show CO2 retention and respiratory acidosis.  Patient placed on BiPAP.  Having more confusion during his ED stay likely due to CO2 narcosis.  Question whether his muscular dystrophy is affecting his diaphragm.  Case was discussed with Dr. Alcario Drought of the hospitalist service as well as Dr. Nelda Marseille critical care.  Family undecided whether they want patient to be intubated. Dr. Nelda Marseille requests to be called back with repeat ABG results  Patient and family seem unclear on  goals of care and progression of his muscular dystrophy likely causing diaphragm weakness and difficulty breathing.  His abdominal distention is likely contributing to his difficulty breathing as well.  He is hypoxic and hypercarbic. Patient unclear whether he want to be intubated and his family states they had no discussion with the patient's PCP or neurologist regarding goals of care or end-of-life.  After 2 hours on BiPAP, there is minimal improvement in hypercarbia and respiratory acidosis. Discussed with Dr. Jimmey Ralph of critical care at bedside. Patient will be admitted to the ICU at this time as he may require intubation if it is within his and the family's wishes.  May benefit from palliative care consultation.  CRITICAL CARE Performed by: Ezequiel Essex Total critical care time: 90 minutes Critical care time was exclusive of separately billable procedures and treating other patients. Critical care was necessary to treat or prevent imminent or life-threatening deterioration. Critical care was time spent personally by me on the following activities: development of treatment plan with patient and/or surrogate as well as nursing, discussions with consultants, evaluation of patient's response to treatment, examination of patient, obtaining history from patient or surrogate, ordering and performing treatments and interventions, ordering and review of laboratory studies, ordering and review of radiographic studies, pulse oximetry and re-evaluation of patient's condition.   Final Clinical Impressions(s) / ED Diagnoses   Final diagnoses:  Generalized abdominal pain  Acute respiratory failure with hypercapnia [facility])    ED Discharge Orders    None       Ezequiel Essex, MD 05/16/18 [phone]

## 2018-05-15 NOTE — ED Triage Notes (Signed)
Pt here with c/o abdominal pain. Pt denies N/v/d. Pt has hx of muscular dystrophy. While triaging pt he has an oxygen sat between 75-78%. Pt is able to speak in full sentences and has adequate cap refill. Pt denies feeling SOB.

## 2018-05-15 NOTE — ED Notes (Signed)
Bed: WA17 Expected date:  Expected time:  Means of arrival:  Comments: Res A

## 2018-05-16 ENCOUNTER — Inpatient Hospital Stay (HOSPITAL_COMMUNITY): Payer: Managed Care, Other (non HMO)

## 2018-05-16 ENCOUNTER — Other Ambulatory Visit: Payer: Self-pay

## 2018-05-16 ENCOUNTER — Encounter (HOSPITAL_COMMUNITY): Payer: Self-pay

## 2018-05-16 ENCOUNTER — Emergency Department (HOSPITAL_COMMUNITY): Payer: Managed Care, Other (non HMO)

## 2018-05-16 DIAGNOSIS — J9601 Acute respiratory failure with hypoxia: Secondary | ICD-10-CM | POA: Diagnosis not present

## 2018-05-16 DIAGNOSIS — R64 Cachexia: Secondary | ICD-10-CM | POA: Diagnosis present

## 2018-05-16 DIAGNOSIS — J9602 Acute respiratory failure with hypercapnia: Secondary | ICD-10-CM

## 2018-05-16 DIAGNOSIS — E872 Acidosis: Secondary | ICD-10-CM | POA: Diagnosis present

## 2018-05-16 DIAGNOSIS — I313 Pericardial effusion (noninflammatory): Secondary | ICD-10-CM | POA: Diagnosis present

## 2018-05-16 DIAGNOSIS — J9621 Acute and chronic respiratory failure with hypoxia: Secondary | ICD-10-CM | POA: Diagnosis present

## 2018-05-16 DIAGNOSIS — Z885 Allergy status to narcotic agent status: Secondary | ICD-10-CM | POA: Diagnosis not present

## 2018-05-16 DIAGNOSIS — J9622 Acute and chronic respiratory failure with hypercapnia: Secondary | ICD-10-CM | POA: Diagnosis present

## 2018-05-16 DIAGNOSIS — G7101 Duchenne or Becker muscular dystrophy: Secondary | ICD-10-CM | POA: Diagnosis present

## 2018-05-16 DIAGNOSIS — G7100 Muscular dystrophy, unspecified: Secondary | ICD-10-CM | POA: Diagnosis not present

## 2018-05-16 DIAGNOSIS — I959 Hypotension, unspecified: Secondary | ICD-10-CM | POA: Diagnosis present

## 2018-05-16 DIAGNOSIS — K567 Ileus, unspecified: Secondary | ICD-10-CM | POA: Diagnosis present

## 2018-05-16 DIAGNOSIS — R1084 Generalized abdominal pain: Secondary | ICD-10-CM | POA: Diagnosis present

## 2018-05-16 DIAGNOSIS — R7989 Other specified abnormal findings of blood chemistry: Secondary | ICD-10-CM

## 2018-05-16 DIAGNOSIS — J90 Pleural effusion, not elsewhere classified: Secondary | ICD-10-CM | POA: Diagnosis present

## 2018-05-16 DIAGNOSIS — E861 Hypovolemia: Secondary | ICD-10-CM | POA: Diagnosis present

## 2018-05-16 DIAGNOSIS — E875 Hyperkalemia: Secondary | ICD-10-CM | POA: Diagnosis present

## 2018-05-16 DIAGNOSIS — K56699 Other intestinal obstruction unspecified as to partial versus complete obstruction: Secondary | ICD-10-CM | POA: Diagnosis not present

## 2018-05-16 DIAGNOSIS — N179 Acute kidney failure, unspecified: Secondary | ICD-10-CM | POA: Diagnosis not present

## 2018-05-16 DIAGNOSIS — Z79899 Other long term (current) drug therapy: Secondary | ICD-10-CM | POA: Diagnosis not present

## 2018-05-16 DIAGNOSIS — Z993 Dependence on wheelchair: Secondary | ICD-10-CM | POA: Diagnosis not present

## 2018-05-16 DIAGNOSIS — Z681 Body mass index (BMI) 19 or less, adult: Secondary | ICD-10-CM | POA: Diagnosis not present

## 2018-05-16 HISTORY — DX: Acute respiratory failure with hypercapnia: J96.02

## 2018-05-16 HISTORY — DX: Acute respiratory failure with hypoxia: J96.01

## 2018-05-16 LAB — BLOOD GAS, ARTERIAL
Acid-Base Excess: 7.6 mmol/L — ABNORMAL HIGH (ref 0.0–2.0)
Acid-Base Excess: 8 mmol/L — ABNORMAL HIGH (ref 0.0–2.0)
Bicarbonate: 41.1 mmol/L — ABNORMAL HIGH (ref 20.0–28.0)
Bicarbonate: 37.2 mmol/L — ABNORMAL HIGH (ref 20.0–28.0)
Delivery systems: POSITIVE
Drawn by: 235321
Drawn by: 257701
Expiratory PAP: 8
FIO2: 30
INSPIRATORY PAP: 18
Mode: POSITIVE
O2 Content: 3 L/min
O2 SAT: 97.9 %
O2 Saturation: 99.1 %
PATIENT TEMPERATURE: 98.6
PCO2 ART: 113 mmHg — AB (ref 32.0–48.0)
PCO2 ART: 76.2 mmHg — AB (ref 32.0–48.0)
PH ART: 7.185 — AB (ref 7.350–7.450)
PO2 ART: 114 mmHg — AB (ref 83.0–108.0)
Patient temperature: 98.6
RATE: 10 resp/min
pH, Arterial: 7.309 — ABNORMAL LOW (ref 7.350–7.450)
pO2, Arterial: 278 mmHg — ABNORMAL HIGH (ref 83.0–108.0)

## 2018-05-16 LAB — URINALYSIS, ROUTINE W REFLEX MICROSCOPIC
Bilirubin Urine: NEGATIVE
GLUCOSE, UA: NEGATIVE mg/dL
Hgb urine dipstick: NEGATIVE
KETONES UR: NEGATIVE mg/dL
LEUKOCYTES UA: NEGATIVE
Nitrite: NEGATIVE
PH: 5 (ref 5.0–8.0)
Protein, ur: NEGATIVE mg/dL
Specific Gravity, Urine: >1.046 — ABNORMAL HIGH (ref 1.005–1.030)

## 2018-05-16 LAB — BLOOD GAS, VENOUS
Acid-Base Excess: 7.2 mmol/L — ABNORMAL HIGH (ref 0.0–2.0)
Bicarbonate: 39.8 mmol/L — ABNORMAL HIGH (ref 20.0–28.0)
DELIVERY SYSTEMS: POSITIVE
Drawn by: 53508
FIO2: 30
Mode: POSITIVE
O2 SAT: 86.2 %
PATIENT TEMPERATURE: 98.6
PO2 VEN: 61.1 mmHg — AB (ref 32.0–45.0)
pCO2, Ven: 106 mmHg (ref 44.0–60.0)
pH, Ven: 7.201 — ABNORMAL LOW (ref 7.250–7.430)

## 2018-05-16 LAB — GLUCOSE, CAPILLARY
GLUCOSE-CAPILLARY: 82 mg/dL (ref 70–99)
GLUCOSE-CAPILLARY: 64 mg/dL — AB (ref 70–99)
Glucose-Capillary: 103 mg/dL — ABNORMAL HIGH (ref 70–99)
Glucose-Capillary: 76 mg/dL (ref 70–99)

## 2018-05-16 LAB — COMPREHENSIVE METABOLIC PANEL
ALBUMIN: 3.8 g/dL (ref 3.5–5.0)
ALT: 39 U/L (ref 0–44)
AST: 40 U/L (ref 15–41)
Alkaline Phosphatase: 46 U/L (ref 38–126)
Anion gap: 13 (ref 5–15)
BILIRUBIN TOTAL: 0.9 mg/dL (ref 0.3–1.2)
BUN: 10 mg/dL (ref 6–20)
CALCIUM: 8.6 mg/dL — AB (ref 8.9–10.3)
CO2: 34 mmol/L — ABNORMAL HIGH (ref 22–32)
Chloride: 91 mmol/L — ABNORMAL LOW (ref 98–111)
Creatinine, Ser: <0.3 mg/dL — ABNORMAL LOW (ref 0.61–1.24)
GLUCOSE: 99 mg/dL (ref 70–99)
Potassium: 5.6 mmol/L — ABNORMAL HIGH (ref 3.5–5.1)
Sodium: 138 mmol/L (ref 135–145)
TOTAL PROTEIN: 6.5 g/dL (ref 6.5–8.1)

## 2018-05-16 LAB — ECHOCARDIOGRAM COMPLETE: Weight: 1520 oz

## 2018-05-16 LAB — RAPID URINE DRUG SCREEN, HOSP PERFORMED
Amphetamines: NOT DETECTED
BENZODIAZEPINES: NOT DETECTED
Barbiturates: NOT DETECTED
COCAINE: NOT DETECTED
Opiates: NOT DETECTED
Tetrahydrocannabinol: NOT DETECTED

## 2018-05-16 LAB — D-DIMER, QUANTITATIVE: D-Dimer, Quant: 0.36 ug/mL-FEU (ref 0.00–0.50)

## 2018-05-16 LAB — BRAIN NATRIURETIC PEPTIDE: B NATRIURETIC PEPTIDE 5: 41.4 pg/mL (ref 0.0–100.0)

## 2018-05-16 LAB — MRSA PCR SCREENING: MRSA BY PCR: NEGATIVE

## 2018-05-16 LAB — LIPASE, BLOOD: Lipase: 24 U/L (ref 11–51)

## 2018-05-16 LAB — TROPONIN I: Troponin I: 0.03 ng/mL (ref <0.03)

## 2018-05-16 LAB — PROCALCITONIN

## 2018-05-16 LAB — CBG MONITORING, ED: GLUCOSE-CAPILLARY: 52 mg/dL — AB (ref 70–99)

## 2018-05-16 MED ORDER — ACETAMINOPHEN 10 MG/ML IV SOLN
1000.0000 mg | Freq: Four times a day (QID) | INTRAVENOUS | Status: DC | PRN
  Filled 2018-05-16: qty 100

## 2018-05-16 MED ORDER — ALBUTEROL SULFATE (2.5 MG/3ML) 0.083% IN NEBU
2.5000 mg | INHALATION_SOLUTION | Freq: Three times a day (TID) | RESPIRATORY_TRACT | Status: DC
  Administered 2018-05-16 – 2018-05-18 (×7): 2.5 mg via RESPIRATORY_TRACT
  Filled 2018-05-16 (×9): qty 3

## 2018-05-16 MED ORDER — FAMOTIDINE IN NACL 20-0.9 MG/50ML-% IV SOLN: 20.0000 mg | Freq: Two times a day (BID) | INTRAVENOUS | Status: DC

## 2018-05-16 MED ORDER — MINERAL OIL RE ENEM
1.0000 | ENEMA | Freq: Every day | RECTAL | Status: DC | PRN
  Filled 2018-05-16: qty 1

## 2018-05-16 MED ORDER — DEXTROSE 50 % IV SOLN
25.0000 mL | Freq: Once | INTRAVENOUS | Status: AC
  Administered 2018-05-16: 25 mL via INTRAVENOUS
  Filled 2018-05-16: qty 50

## 2018-05-16 MED ORDER — ONDANSETRON HCL 4 MG/2ML IJ SOLN: 4.0000 mg | Freq: Four times a day (QID) | INTRAMUSCULAR | Status: DC | PRN

## 2018-05-16 MED ORDER — SODIUM CHLORIDE 0.9 % IV BOLUS
1000.0000 mL | Freq: Once | INTRAVENOUS | Status: AC
  Administered 2018-05-16: 1000 mL via INTRAVENOUS

## 2018-05-16 MED ORDER — DEXTROSE-NACL 5-0.45 % IV SOLN
INTRAVENOUS | Status: DC
  Administered 2018-05-16: 1000 mL via INTRAVENOUS
  Administered 2018-05-18: 07:00:00 via INTRAVENOUS

## 2018-05-16 MED ORDER — POLYETHYLENE GLYCOL 3350 17 G PO PACK
17.0000 g | PACK | Freq: Every day | ORAL | Status: DC
  Administered 2018-05-16 – 2018-05-20 (×5): 17 g via ORAL
  Filled 2018-05-16 (×5): qty 1

## 2018-05-16 MED ORDER — INSULIN ASPART 100 UNIT/ML ~~LOC~~ SOLN: 1.0000 [IU] | Freq: Four times a day (QID) | SUBCUTANEOUS | Status: DC | PRN

## 2018-05-16 MED ORDER — SODIUM CHLORIDE 0.9 % IV SOLN
250.0000 mL | INTRAVENOUS | Status: DC | PRN
  Administered 2018-05-16: 250 mL via INTRAVENOUS

## 2018-05-16 MED ORDER — DEXTROSE 50 % IV SOLN
25.0000 mL | Freq: Once | INTRAVENOUS | Status: AC
  Administered 2018-05-16: 25 mL via INTRAVENOUS

## 2018-05-16 MED ORDER — SODIUM CHLORIDE 0.9 % IV SOLN
1.0000 g | Freq: Once | INTRAVENOUS | Status: AC
  Administered 2018-05-16: 1 g via INTRAVENOUS
  Filled 2018-05-16: qty 10

## 2018-05-16 MED ORDER — IOPAMIDOL (ISOVUE-370) INJECTION 76%
INTRAVENOUS | Status: AC
  Filled 2018-05-16: qty 100

## 2018-05-16 MED ORDER — IOPAMIDOL (ISOVUE-370) INJECTION 76%
100.0000 mL | Freq: Once | INTRAVENOUS | Status: AC | PRN
  Administered 2018-05-16: 80 mL via INTRAVENOUS

## 2018-05-16 MED ORDER — DEXTROSE 50 % IV SOLN
INTRAVENOUS | Status: AC
  Administered 2018-05-16: 25 mL via INTRAVENOUS
  Filled 2018-05-16: qty 50

## 2018-05-16 MED ORDER — HEPARIN SODIUM (PORCINE) 5000 UNIT/ML IJ SOLN
5000.0000 [IU] | Freq: Three times a day (TID) | INTRAMUSCULAR | Status: DC
  Administered 2018-05-16 – 2018-05-20 (×9): 5000 [IU] via SUBCUTANEOUS
  Filled 2018-05-16 (×11): qty 1

## 2018-05-16 MED ORDER — BISACODYL 10 MG RE SUPP: 10.0000 mg | Freq: Every day | RECTAL | Status: DC | PRN

## 2018-05-16 NOTE — ED Notes (Signed)
BLOOD SUGAR 52

## 2018-05-16 NOTE — Progress Notes (Signed)
NIF -10 with fair effort, Pt unable to obtain good seal around mouth piece.  RT to monitor and assess as needed.

## 2018-05-16 NOTE — Progress Notes (Signed)
PULMONARY / CRITICAL CARE MEDICINE   Name: Peter Hayes MRN: [id number] DOB: October 14, 1995    ADMISSION DATE:  05/15/2018  REFERRING MD:  Dr. Wyvonnia Dusky  CHIEF COMPLAINT: Abdominal pain  HISTORY OF PRESENT ILLNESS:   22 yo male presented to ED with abdominal pain.  Has hx of Duchenne's muscular dystrophy.  Was hypoxic, hypercapnic in ER and placed on Bipap.  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  CT abdomen showed gaseous distention of the colon.  PAST MEDICAL HISTORY :  [employer]  STUDIES:  CT angio chest 8/09 >> mild CM, pericardial effusion, small b/l pleural effusions, ATX (reviewed images by me personally, and impression is my own after personally reviewing images independently by me personally) CT abd/pelvis 8/09 >> large amount of gas throughout colon with stool in Rt colon, no murali thickening, no obstruction Echo 8/09 >>   CULTURES: Blood 8/09 >> Urine 8/09 >>  ANTIBIOTICS: Rocephin 8/09 >> 8/09  SIGNIFICANT EVENTS: 8/09 Admit   LINES/TUBES:  DISCUSSION: He has recurrent ileus.  He likely has baseline hypercapnia with acute worsening.  Will ask GI to reassess.  Discussed with family about probable need for NIPPV set up at home once he is discharged.  ASSESSMENT / PLAN:  Acute on chronic hypoxic/hypercapnic respiratory failure. - oxygen to keep SpO2 > 90% - Bipap qhs and prn  Abdominal pain with recurrent ileus. - NPO - avoid narcotics - consult GI to reassess  Hypotension likely from hypovolemia. Pericardial effusion. - f/u Echo - continue IV fluids  Hyperkalemia. - likely from acidosis - f/u BMET  DVT prophylaxis - SQ heparin SUP - not indicated Nutrition - NPO Goals of care - full code  Updated family at bedside  SUBJECTIVE:  Not passing gas.  No BMs.  Still has abdominal discomfort.  Breathing better, and wants to come off Bipap.  Only sees PCP.   Hasn't seen neurology recently.  Didn't have GI f/u after hospital stay in April.  VITAL SIGNS: BP (!) 86/60   Pulse 99   Temp 98.3 F (36.8 C) (Oral)   Resp 11   Wt 43.1 kg   SpO2 97%   BMI 18.55 kg/m   VENTILATOR SETTINGS: FiO2 (%):  [30 %] 30 %  INTAKE / OUTPUT: I/O last 3 completed shifts: In: 2200 [IV Piggyback:2200] Out: -   PHYSICAL EXAMINATION:  General - alert Eyes - pupils reactive ENT - Bipap mask on Cardiac - regular rate/rhythm, no murmur Chest - decreased BS, no wheeze Abdomen - soft, mild tenderness diffusely, mild distention, hyperactive bowel sounds Extremities - contracted, decreased muscle bulk Skin - no rashes Lymphatics - no lymphadenopathy Neuro - follows commands, and responds appropriately   LABS:  BMET Recent Labs  Lab 05/15/18 2327  NA 138  K 5.6*  CL 91*  CO2 34*  BUN 10  CREATININE <0.30*  GLUCOSE 99    Electrolytes Recent Labs  Lab 05/15/18 2327  CALCIUM 8.6*    CBC Recent Labs  Lab 05/15/18 2327  WBC 4.8  HGB 16.6  HCT 53.7*  PLT 146*    Coag's No results for input(s): APTT, INR in the last 168 hours.  Sepsis Markers Recent Labs  Lab 05/15/18 2336  LATICACIDVEN 1.14    ABG Recent Labs  Lab 05/16/18 0240 05/16/18 0817  PHART 7.185* 7.309*  PCO2ART 113* 76.2*  PO2ART 278* 114*    Liver Enzymes  Recent Labs  Lab 05/15/18 2327  AST 40  ALT 39  ALKPHOS 46  BILITOT 0.9  ALBUMIN 3.8    Cardiac Enzymes Recent Labs  Lab 05/15/18 2332  TROPONINI 0.03*    Glucose Recent Labs  Lab 05/16/18 0807 05/16/18 0903  GLUCAP 52* 103*    Imaging Ct Angio Chest Pe W And/or Wo Contrast  Result Date: 05/16/2018 CLINICAL DATA:  Hypoxia with low oxygen saturation and abdominal pain. White blood cell count 4.8. History of muscular dystrophy. EXAM: CT ANGIOGRAPHY CHEST CT ABDOMEN AND PELVIS WITH CONTRAST TECHNIQUE: Multidetector CT imaging of the chest was performed using the standard protocol during  bolus administration of intravenous contrast. Multiplanar CT image reconstructions and MIPs were obtained to evaluate the vascular anatomy. Multidetector CT imaging of the abdomen and pelvis was performed using the standard protocol during bolus administration of intravenous contrast. CONTRAST:  55mL ISOVUE-370 IOPAMIDOL (ISOVUE-370) INJECTION 76% COMPARISON:  CT AP 01/10/2018 FINDINGS: CTA CHEST FINDINGS Cardiovascular: Satisfactory opacification of the thoracic aorta and pulmonary arteries to the segmental level. No pulmonary embolus. No aortic aneurysm or dissection. Heart size is mildly enlarged with pericardial effusion measuring up to 1.4 cm in thickness over the left ventricular apex. Conventional branch pattern of the great vessels without stenosis or dissection. Mediastinum/Nodes: No enlarged mediastinal, hilar, or axillary lymph nodes. Thyroid gland, trachea, and esophagus demonstrate no significant findings. Lungs/Pleura: Small bilateral pleural effusions with adjacent compressive atelectasis. Subpleural faint opacities in the lingula suspicious for pneumonia and/or atelectasis, series 6/63 for example. No dominant pulmonary mass. No pneumothorax. Musculoskeletal: No chest wall abnormality. No acute or significant osseous findings. Review of the MIP images confirms the above findings. CT ABDOMEN and PELVIS FINDINGS Hepatobiliary: No focal liver abnormality is seen. No gallstones, gallbladder wall thickening, or biliary dilatation. Pancreas: Unremarkable. No pancreatic ductal dilatation or surrounding inflammatory changes. Spleen: Normal in size without focal abnormality. Adrenals/Urinary Tract: Adrenal glands are unremarkable. Kidneys are normal, without renal calculi, focal lesion, or hydronephrosis. Mild circumferential mural thickening of the bladder which may reflect stigmata of cystitis. Stomach/Bowel: Fluid-filled distention of the stomach with normal small bowel rotation. No small bowel dilatation  or obstruction. A large amount of gas is seen throughout much of the colon with stool in the right colon. No mural thickening is noted. More liquid stool is seen in the recto sigmoid. Appendix is not confidently identified. Vascular/Lymphatic: Nonaneurysmal abdominal aorta. No lymphadenopathy. Reproductive: Normal size prostate. Other: No free air nor free fluid. Musculoskeletal: Diffuse severe muscle atrophy system with muscular dystrophy. Thoracolumbar levoscoliosis. Review of the MIP images confirms the above findings. IMPRESSION: CT chest: 1. Small bilateral pleural effusions with adjacent atelectasis. No active pulmonary disease. 2. No acute pulmonary embolus.  No aortic aneurysm or dissection. 3. Small pericardial effusion measuring up to 1.4 cm in thickness. CT AP: 1. No bowel obstruction. Diffuse moderate gaseous distention of the colon similar to prior may represent dysmotility. No definite mechanical bowel obstruction. 2. Diffuse muscle atrophy consistent with history of muscular dystrophy. 3. Slightly thickened appearance of the bladder wall raises the possibility of chronic cystitis. Electronically Signed   By: Ashley Royalty M.D.   On: 05/16/2018 02:08   Ct Abdomen Pelvis W Contrast  Result Date: 05/16/2018 CLINICAL DATA:  Hypoxia with low oxygen saturation and abdominal pain. White blood cell count 4.8. History of muscular dystrophy. EXAM: CT ANGIOGRAPHY CHEST CT ABDOMEN AND PELVIS WITH CONTRAST TECHNIQUE: Multidetector CT imaging of the chest was performed using the standard protocol during  bolus administration of intravenous contrast. Multiplanar CT image reconstructions and MIPs were obtained to evaluate the vascular anatomy. Multidetector CT imaging of the abdomen and pelvis was performed using the standard protocol during bolus administration of intravenous contrast. CONTRAST:  58mL ISOVUE-370 IOPAMIDOL (ISOVUE-370) INJECTION 76% COMPARISON:  CT AP 01/10/2018 FINDINGS: CTA CHEST FINDINGS  Cardiovascular: Satisfactory opacification of the thoracic aorta and pulmonary arteries to the segmental level. No pulmonary embolus. No aortic aneurysm or dissection. Heart size is mildly enlarged with pericardial effusion measuring up to 1.4 cm in thickness over the left ventricular apex. Conventional branch pattern of the great vessels without stenosis or dissection. Mediastinum/Nodes: No enlarged mediastinal, hilar, or axillary lymph nodes. Thyroid gland, trachea, and esophagus demonstrate no significant findings. Lungs/Pleura: Small bilateral pleural effusions with adjacent compressive atelectasis. Subpleural faint opacities in the lingula suspicious for pneumonia and/or atelectasis, series 6/63 for example. No dominant pulmonary mass. No pneumothorax. Musculoskeletal: No chest wall abnormality. No acute or significant osseous findings. Review of the MIP images confirms the above findings. CT ABDOMEN and PELVIS FINDINGS Hepatobiliary: No focal liver abnormality is seen. No gallstones, gallbladder wall thickening, or biliary dilatation. Pancreas: Unremarkable. No pancreatic ductal dilatation or surrounding inflammatory changes. Spleen: Normal in size without focal abnormality. Adrenals/Urinary Tract: Adrenal glands are unremarkable. Kidneys are normal, without renal calculi, focal lesion, or hydronephrosis. Mild circumferential mural thickening of the bladder which may reflect stigmata of cystitis. Stomach/Bowel: Fluid-filled distention of the stomach with normal small bowel rotation. No small bowel dilatation or obstruction. A large amount of gas is seen throughout much of the colon with stool in the right colon. No mural thickening is noted. More liquid stool is seen in the recto sigmoid. Appendix is not confidently identified. Vascular/Lymphatic: Nonaneurysmal abdominal aorta. No lymphadenopathy. Reproductive: Normal size prostate. Other: No free air nor free fluid. Musculoskeletal: Diffuse severe muscle  atrophy system with muscular dystrophy. Thoracolumbar levoscoliosis. Review of the MIP images confirms the above findings. IMPRESSION: CT chest: 1. Small bilateral pleural effusions with adjacent atelectasis. No active pulmonary disease. 2. No acute pulmonary embolus.  No aortic aneurysm or dissection. 3. Small pericardial effusion measuring up to 1.4 cm in thickness. CT AP: 1. No bowel obstruction. Diffuse moderate gaseous distention of the colon similar to prior may represent dysmotility. No definite mechanical bowel obstruction. 2. Diffuse muscle atrophy consistent with history of muscular dystrophy. 3. Slightly thickened appearance of the bladder wall raises the possibility of chronic cystitis. Electronically Signed   By: Ashley Royalty M.D.   On: 05/16/2018 02:08   Dg Chest Portable 1 View  Result Date: 05/16/2018 CLINICAL DATA:  Acute onset of generalized abdominal pain and chest pain. EXAM: PORTABLE CHEST 1 VIEW COMPARISON:  Chest radiograph performed 12/05/2017 FINDINGS: The lungs are mildly hypoexpanded. Apparent vascular congestion is noted. No pleural effusion or pneumothorax is seen. There is no evidence of focal opacification, pleural effusion or pneumothorax. The cardiomediastinal silhouette is within normal limits. No acute osseous abnormalities are seen. IMPRESSION: Apparent vascular congestion noted. Lungs mildly hypoexpanded but grossly clear. Electronically Signed   By: Garald Balding M.D.   On: 05/16/2018 00:15   Dg Abd Portable 2 Views  Result Date: 05/16/2018 CLINICAL DATA:  Acute onset of generalized abdominal pain. EXAM: PORTABLE ABDOMEN - 2 VIEW COMPARISON:  Abdominal radiograph performed 01/13/2018 FINDINGS: There is diffuse distention of the colon with air, possibly reflecting mild ileus; a large amount of stool at the pelvis could reflect fecal impaction. No abnormal dilatation of small bowel loops  is seen to suggest small bowel obstruction. No free intra-abdominal air is identified on  the provided decubitus view, though evaluation is somewhat suboptimal due to the degree of penetration. The visualized osseous structures are within normal limits; the sacroiliac joints are unremarkable in appearance. The visualized lung bases are essentially clear. IMPRESSION: Diffuse distention of the colon with air, possibly reflecting mild ileus; a large amount of stool at the pelvis could reflect fecal impaction. No abnormal dilatation of small bowel loops seen to suggest small bowel obstruction. No definite free intra-abdominal air seen. Electronically Signed   By: Garald Balding M.D.   On: 05/16/2018 00:16

## 2018-05-16 NOTE — ED Notes (Signed)
Admit Provider at bedside.

## 2018-05-16 NOTE — Consult Note (Signed)
Reason for Consult: Colonic Ileus Referring Physician: CCM  [name] HPI: This is a 22 year old male with a PMH of Muscular Dystrophy and sigmoid volvulus s/p decompression by Dr. Oletta Lamas on 01/10/2018.  He presents to the hospital with complaints of abdominal pain and SOB.  Upon admission he was noted to be hypoxic with a Pox of 75% and hypercapnic.  He was placed on BIPAP, which improved his pulmonary paramenters.  Work up for his abdominal pain complaints showed that he had a dilated colon, but no evidence of SBO.  He has a history of constipation and his father reports using Miralax once every two weeks with very good success.  His sister reports that his abdominal appeared to be more distended yesterday, but it is now at his baseline level of distension.  He feels better today and he does acknowledge having some mild flatus.  In the past, suppositories were used at home when he had a worsening of his constipation.  Overall the family reports that he has good control of his bowels with the above described regimen.  Past Medical History:  Diagnosis Date  . MD (muscular dystrophy) ([place])   . Refusal of blood product    patient is Peter Hayes witness    Past Surgical History:  Procedure Laterality Date  . EYE SURGERY    . FLEXIBLE SIGMOIDOSCOPY N/A 01/10/2018   Procedure: FLEXIBLE SIGMOIDOSCOPY;  Surgeon: Laurence Spates, MD;  Location: [facility];  Service: Endoscopy;  Laterality: N/A;    No family history on file.  Social History:  reports that he has never smoked. He has never used smokeless tobacco. He reports that he does not drink alcohol or use drugs.  Allergies: No Known Allergies  Medications:  Scheduled: . albuterol  2.5 mg Nebulization TID  . heparin  5,000 Units Subcutaneous Q8H   Continuous: . dextrose 5 % and 0.45% NaCl 1,000 mL (05/16/18 1311)    Results for orders placed or performed during the hospital encounter of 05/15/18 (from the past 24 hour(s))  Blood culture  (routine x 2)     Status: None (Preliminary result)   Collection Time: 05/15/18 11:25 PM  Result Value Ref Range   Specimen Description      BLOOD LEFT FOREARM Performed at [facility] [address], [place], [place] [address]    Special Requests      BOTTLES DRAWN AEROBIC AND ANAEROBIC Blood Culture adequate volume Performed at [facility] [address], [place], [place] [address]    Culture PENDING    Report Status PENDING   CBC with Differential/Platelet     Status: Abnormal   Collection Time: 05/15/18 11:27 PM  Result Value Ref Range   WBC 4.8 4.0 - 10.5 K/uL   RBC 5.77 4.22 - 5.81 MIL/uL   Hemoglobin 16.6 13.0 - 17.0 g/dL   HCT 53.7 (H) 39.0 - 52.0 %   MCV 93.1 78.0 - 100.0 fL   MCH 28.8 26.0 - 34.0 pg   MCHC 30.9 30.0 - 36.0 g/dL   RDW 14.6 11.5 - 15.5 %   Platelets 146 (L) 150 - 400 K/uL   Neutrophils Relative % 59 %   Neutro Abs 2.8 1.7 - 7.7 K/uL   Lymphocytes Relative 23 %   Lymphs Abs 1.1 0.7 - 4.0 K/uL   Monocytes Relative 18 %   Monocytes Absolute 0.9 0.1 - 1.0 K/uL   Eosinophils Relative 0 %   Eosinophils Absolute 0.0 0.0 -  0.7 K/uL   Basophils Relative 0 %   Basophils Absolute 0.0 0.0 - 0.1 K/uL  Comprehensive metabolic panel     Status: Abnormal   Collection Time: 05/15/18 11:27 PM  Result Value Ref Range   Sodium 138 135 - 145 mmol/L   Potassium 5.6 (H) 3.5 - 5.1 mmol/L   Chloride 91 (L) 98 - 111 mmol/L   CO2 34 (H) 22 - 32 mmol/L   Glucose, Bld 99 70 - 99 mg/dL   BUN 10 6 - 20 mg/dL   Creatinine, Ser <0.30 (L) 0.61 - 1.24 mg/dL   Calcium 8.6 (L) 8.9 - 10.3 mg/dL   Total Protein 6.5 6.5 - 8.1 g/dL   Albumin 3.8 3.5 - 5.0 g/dL   AST 40 15 - 41 U/L   ALT 39 0 - 44 U/L   Alkaline Phosphatase 46 38 - 126 U/L   Total Bilirubin 0.9 0.3 - 1.2 mg/dL   GFR calc non Af Amer NOT CALCULATED >60 mL/min   GFR calc Af Amer NOT CALCULATED >60 mL/min   Anion gap 13 5 - 15  Lipase, blood     Status: None   Collection Time: 05/15/18  11:27 PM  Result Value Ref Range   Lipase 24 11 - 51 U/L  Troponin I     Status: Abnormal   Collection Time: 05/15/18 11:32 PM  Result Value Ref Range   Troponin I 0.03 (HH) <0.03 ng/mL  I-Stat CG4 Lactic Acid, ED     Status: None   Collection Time: 05/15/18 11:36 PM  Result Value Ref Range   Lactic Acid, Venous 1.14 0.5 - 1.9 mmol/L  POC occult blood, ED Provider will collect     Status: Abnormal   Collection Time: 05/15/18 11:48 PM  Result Value Ref Range   Fecal Occult Bld POSITIVE (A) NEGATIVE  Blood gas, arterial ([facility] ONLY)     Status: Abnormal   Collection Time: 05/16/18  2:40 AM  Result Value Ref Range   O2 Content 3.0 L/min   Delivery systems NASAL CANNULA    pH, Arterial 7.185 (LL) 7.350 - 7.450   pCO2 arterial 113 (HH) 32.0 - 48.0 mmHg   pO2, Arterial 278 (H) 83.0 - 108.0 mmHg   Bicarbonate 41.1 (H) 20.0 - 28.0 mmol/L   Acid-Base Excess 7.6 (H) 0.0 - 2.0 mmol/L   O2 Saturation 99.1 %   Patient temperature 98.6    Collection site RIGHT RADIAL    Drawn by [id number]    Sample type ARTERIAL DRAW    Allens test (pass/fail) PASS PASS  Urinalysis, Routine w reflex microscopic     Status: Abnormal   Collection Time: 05/16/18  3:03 AM  Result Value Ref Range   Color, Urine YELLOW YELLOW   APPearance CLEAR CLEAR   Specific Gravity, Urine >1.046 (H) 1.005 - 1.030   pH 5.0 5.0 - 8.0   Glucose, UA NEGATIVE NEGATIVE mg/dL   Hgb urine dipstick NEGATIVE NEGATIVE   Bilirubin Urine NEGATIVE NEGATIVE   Ketones, ur NEGATIVE NEGATIVE mg/dL   Protein, ur NEGATIVE NEGATIVE mg/dL   Nitrite NEGATIVE NEGATIVE   Leukocytes, UA NEGATIVE NEGATIVE  Urine rapid drug screen (hosp performed)     Status: None   Collection Time: 05/16/18  3:03 AM  Result Value Ref Range   Opiates NONE DETECTED NONE DETECTED   Cocaine NONE DETECTED NONE DETECTED   Benzodiazepines NONE DETECTED NONE DETECTED   Amphetamines NONE DETECTED NONE DETECTED   Tetrahydrocannabinol NONE  DETECTED NONE DETECTED    Barbiturates NONE DETECTED NONE DETECTED  Blood gas, venous     Status: Abnormal   Collection Time: 05/16/18  4:35 AM  Result Value Ref Range   FIO2 30.00    Delivery systems BILEVEL POSITIVE AIRWAY PRESSURE    Mode BILEVEL POSITIVE AIRWAY PRESSURE    pH, Ven 7.201 (L) 7.250 - 7.430   pCO2, Ven 106 (HH) 44.0 - 60.0 mmHg   pO2, Ven 61.1 (H) 32.0 - 45.0 mmHg   Bicarbonate 39.8 (H) 20.0 - 28.0 mmol/L   Acid-Base Excess 7.2 (H) 0.0 - 2.0 mmol/L   O2 Saturation 86.2 %   Patient temperature 98.6    Collection site VEIN    Drawn by [phone]    Sample type VEIN   Brain natriuretic peptide     Status: None   Collection Time: 05/16/18  6:20 AM  Result Value Ref Range   B Natriuretic Peptide 41.4 0.0 - 100.0 pg/mL  CBG monitoring, ED     Status: Abnormal   Collection Time: 05/16/18  8:07 AM  Result Value Ref Range   Glucose-Capillary 52 (L) 70 - 99 mg/dL  Blood gas, arterial     Status: Abnormal   Collection Time: 05/16/18  8:17 AM  Result Value Ref Range   FIO2 30.00    Delivery systems BILEVEL POSITIVE AIRWAY PRESSURE    Mode BILEVEL POSITIVE AIRWAY PRESSURE    LHR 10 resp/min   Inspiratory PAP 18.0    Expiratory PAP 8.0    pH, Arterial 7.309 (L) 7.350 - 7.450   pCO2 arterial 76.2 (HH) 32.0 - 48.0 mmHg   pO2, Arterial 114 (H) 83.0 - 108.0 mmHg   Bicarbonate 37.2 (H) 20.0 - 28.0 mmol/L   Acid-Base Excess 8.0 (H) 0.0 - 2.0 mmol/L   O2 Saturation 97.9 %   Patient temperature 98.6    Collection site LEFT RADIAL    Drawn by [id number]    Sample type ARTERIAL DRAW   Glucose, capillary     Status: Abnormal   Collection Time: 05/16/18  9:03 AM  Result Value Ref Range   Glucose-Capillary 103 (H) 70 - 99 mg/dL   Comment 1 Notify RN    Comment 2 Document in Chart   D-dimer, quantitative (not at [facility])     Status: None   Collection Time: 05/16/18  9:21 AM  Result Value Ref Range   D-Dimer, Quant 0.36 0.00 - 0.50 ug/mL-FEU  Procalcitonin     Status: None   Collection Time: 05/16/18  9:21 AM   Result Value Ref Range   Procalcitonin <0.10 ng/mL  MRSA PCR Screening     Status: None   Collection Time: 05/16/18 10:19 AM  Result Value Ref Range   MRSA by PCR NEGATIVE NEGATIVE  Glucose, capillary     Status: None   Collection Time: 05/16/18 11:58 AM  Result Value Ref Range   Glucose-Capillary 76 70 - 99 mg/dL   Comment 1 Notify RN    Comment 2 Document in Chart      Ct Angio Chest Pe W And/or Wo Contrast  Result Date: 05/16/2018 CLINICAL DATA:  Hypoxia with low oxygen saturation and abdominal pain. White blood cell count 4.8. History of muscular dystrophy. EXAM: CT ANGIOGRAPHY CHEST CT ABDOMEN AND PELVIS WITH CONTRAST TECHNIQUE: Multidetector CT imaging of the chest was performed using the standard protocol during bolus administration of intravenous contrast. Multiplanar CT image reconstructions and MIPs were obtained to evaluate the vascular anatomy. Multidetector  CT imaging of the abdomen and pelvis was performed using the standard protocol during bolus administration of intravenous contrast. CONTRAST:  58mL ISOVUE-370 IOPAMIDOL (ISOVUE-370) INJECTION 76% COMPARISON:  CT AP 01/10/2018 FINDINGS: CTA CHEST FINDINGS Cardiovascular: Satisfactory opacification of the thoracic aorta and pulmonary arteries to the segmental level. No pulmonary embolus. No aortic aneurysm or dissection. Heart size is mildly enlarged with pericardial effusion measuring up to 1.4 cm in thickness over the left ventricular apex. Conventional branch pattern of the great vessels without stenosis or dissection. Mediastinum/Nodes: No enlarged mediastinal, hilar, or axillary lymph nodes. Thyroid gland, trachea, and esophagus demonstrate no significant findings. Lungs/Pleura: Small bilateral pleural effusions with adjacent compressive atelectasis. Subpleural faint opacities in the lingula suspicious for pneumonia and/or atelectasis, series 6/63 for example. No dominant pulmonary mass. No pneumothorax. Musculoskeletal: No chest  wall abnormality. No acute or significant osseous findings. Review of the MIP images confirms the above findings. CT ABDOMEN and PELVIS FINDINGS Hepatobiliary: No focal liver abnormality is seen. No gallstones, gallbladder wall thickening, or biliary dilatation. Pancreas: Unremarkable. No pancreatic ductal dilatation or surrounding inflammatory changes. Spleen: Normal in size without focal abnormality. Adrenals/Urinary Tract: Adrenal glands are unremarkable. Kidneys are normal, without renal calculi, focal lesion, or hydronephrosis. Mild circumferential mural thickening of the bladder which may reflect stigmata of cystitis. Stomach/Bowel: Fluid-filled distention of the stomach with normal small bowel rotation. No small bowel dilatation or obstruction. A large amount of gas is seen throughout much of the colon with stool in the right colon. No mural thickening is noted. More liquid stool is seen in the recto sigmoid. Appendix is not confidently identified. Vascular/Lymphatic: Nonaneurysmal abdominal aorta. No lymphadenopathy. Reproductive: Normal size prostate. Other: No free air nor free fluid. Musculoskeletal: Diffuse severe muscle atrophy system with muscular dystrophy. Thoracolumbar levoscoliosis. Review of the MIP images confirms the above findings. IMPRESSION: CT chest: 1. Small bilateral pleural effusions with adjacent atelectasis. No active pulmonary disease. 2. No acute pulmonary embolus.  No aortic aneurysm or dissection. 3. Small pericardial effusion measuring up to 1.4 cm in thickness. CT AP: 1. No bowel obstruction. Diffuse moderate gaseous distention of the colon similar to prior may represent dysmotility. No definite mechanical bowel obstruction. 2. Diffuse muscle atrophy consistent with history of muscular dystrophy. 3. Slightly thickened appearance of the bladder wall raises the possibility of chronic cystitis. Electronically Signed   By: Ashley Royalty M.D.   On: 05/16/2018 02:08   Ct Abdomen Pelvis  W Contrast  Result Date: 05/16/2018 CLINICAL DATA:  Hypoxia with low oxygen saturation and abdominal pain. White blood cell count 4.8. History of muscular dystrophy. EXAM: CT ANGIOGRAPHY CHEST CT ABDOMEN AND PELVIS WITH CONTRAST TECHNIQUE: Multidetector CT imaging of the chest was performed using the standard protocol during bolus administration of intravenous contrast. Multiplanar CT image reconstructions and MIPs were obtained to evaluate the vascular anatomy. Multidetector CT imaging of the abdomen and pelvis was performed using the standard protocol during bolus administration of intravenous contrast. CONTRAST:  3mL ISOVUE-370 IOPAMIDOL (ISOVUE-370) INJECTION 76% COMPARISON:  CT AP 01/10/2018 FINDINGS: CTA CHEST FINDINGS Cardiovascular: Satisfactory opacification of the thoracic aorta and pulmonary arteries to the segmental level. No pulmonary embolus. No aortic aneurysm or dissection. Heart size is mildly enlarged with pericardial effusion measuring up to 1.4 cm in thickness over the left ventricular apex. Conventional branch pattern of the great vessels without stenosis or dissection. Mediastinum/Nodes: No enlarged mediastinal, hilar, or axillary lymph nodes. Thyroid gland, trachea, and esophagus demonstrate no significant findings. Lungs/Pleura: Small bilateral pleural  effusions with adjacent compressive atelectasis. Subpleural faint opacities in the lingula suspicious for pneumonia and/or atelectasis, series 6/63 for example. No dominant pulmonary mass. No pneumothorax. Musculoskeletal: No chest wall abnormality. No acute or significant osseous findings. Review of the MIP images confirms the above findings. CT ABDOMEN and PELVIS FINDINGS Hepatobiliary: No focal liver abnormality is seen. No gallstones, gallbladder wall thickening, or biliary dilatation. Pancreas: Unremarkable. No pancreatic ductal dilatation or surrounding inflammatory changes. Spleen: Normal in size without focal abnormality.  Adrenals/Urinary Tract: Adrenal glands are unremarkable. Kidneys are normal, without renal calculi, focal lesion, or hydronephrosis. Mild circumferential mural thickening of the bladder which may reflect stigmata of cystitis. Stomach/Bowel: Fluid-filled distention of the stomach with normal small bowel rotation. No small bowel dilatation or obstruction. A large amount of gas is seen throughout much of the colon with stool in the right colon. No mural thickening is noted. More liquid stool is seen in the recto sigmoid. Appendix is not confidently identified. Vascular/Lymphatic: Nonaneurysmal abdominal aorta. No lymphadenopathy. Reproductive: Normal size prostate. Other: No free air nor free fluid. Musculoskeletal: Diffuse severe muscle atrophy system with muscular dystrophy. Thoracolumbar levoscoliosis. Review of the MIP images confirms the above findings. IMPRESSION: CT chest: 1. Small bilateral pleural effusions with adjacent atelectasis. No active pulmonary disease. 2. No acute pulmonary embolus.  No aortic aneurysm or dissection. 3. Small pericardial effusion measuring up to 1.4 cm in thickness. CT AP: 1. No bowel obstruction. Diffuse moderate gaseous distention of the colon similar to prior may represent dysmotility. No definite mechanical bowel obstruction. 2. Diffuse muscle atrophy consistent with history of muscular dystrophy. 3. Slightly thickened appearance of the bladder wall raises the possibility of chronic cystitis. Electronically Signed   By: Ashley Royalty M.D.   On: 05/16/2018 02:08   Dg Chest Portable 1 View  Result Date: 05/16/2018 CLINICAL DATA:  Acute onset of generalized abdominal pain and chest pain. EXAM: PORTABLE CHEST 1 VIEW COMPARISON:  Chest radiograph performed 12/05/2017 FINDINGS: The lungs are mildly hypoexpanded. Apparent vascular congestion is noted. No pleural effusion or pneumothorax is seen. There is no evidence of focal opacification, pleural effusion or pneumothorax. The  cardiomediastinal silhouette is within normal limits. No acute osseous abnormalities are seen. IMPRESSION: Apparent vascular congestion noted. Lungs mildly hypoexpanded but grossly clear. Electronically Signed   By: Garald Balding M.D.   On: 05/16/2018 00:15   Dg Abd Portable 2 Views  Result Date: 05/16/2018 CLINICAL DATA:  Acute onset of generalized abdominal pain. EXAM: PORTABLE ABDOMEN - 2 VIEW COMPARISON:  Abdominal radiograph performed 01/13/2018 FINDINGS: There is diffuse distention of the colon with air, possibly reflecting mild ileus; a large amount of stool at the pelvis could reflect fecal impaction. No abnormal dilatation of small bowel loops is seen to suggest small bowel obstruction. No free intra-abdominal air is identified on the provided decubitus view, though evaluation is somewhat suboptimal due to the degree of penetration. The visualized osseous structures are within normal limits; the sacroiliac joints are unremarkable in appearance. The visualized lung bases are essentially clear. IMPRESSION: Diffuse distention of the colon with air, possibly reflecting mild ileus; a large amount of stool at the pelvis could reflect fecal impaction. No abnormal dilatation of small bowel loops seen to suggest small bowel obstruction. No definite free intra-abdominal air seen. Electronically Signed   By: Garald Balding M.D.   On: 05/16/2018 00:16    ROS:  As stated above in the HPI otherwise negative.  Blood pressure (!) 86/60, pulse 99, temperature 97.7  F (36.5 C), temperature source Axillary, resp. rate 11, weight 43.1 kg, SpO2 100 %.    PE: Gen: NAD, Alert and Oriented HEENT:  [place]/AT, EOMI Neck: Supple, no LAD Lungs: CTA Bilaterally CV: RRR without M/G/R ABM: Soft, NT, moderate distension, hypoactive bowel sounds Ext: No C/C/E, muscular atrophy  Assessment/Plan: 1) Colonic ileus. 2) History of constipation. 3) Muscular dystrophy.   It is encouraging to hear that a simple regimen of  Miralax once every two weeks and intermittent suppositories help with his constipation.  Currently, his abdominal distension is at baseline.  His prior CT scans on 09/23/2017 and 05/05/2016 show a baseline level of colonic distension.  His electrolytes on admission showed a potassium of 5.6 and a lower level calcium at 8.6.    Plan: 1) Miralax QD. 2) Correct electrolytes.  Shiri Hodapp D 05/16/2018, 2:32 PM

## 2018-05-16 NOTE — Care Management Note (Signed)
Case Management Note  Patient Details  Name: Kassim Guertin MRN: [id number] Date of Birth: 1996-04-22  Subjective/Objective:      Hypoxia and severe back pain, has hx of ms/is a jeovha's witness/does not per the md notes what his prognosis is or what level of care he would like to continue with.             Action/Plan:  Iv ns, iv tyenol drip, iv pepcid.  bipap for hypoxia   Expected Discharge Date:  (unknown)               Expected Discharge Plan:  Home/Self Care  In-House Referral:     Discharge planning Services  CM Consult  Post Acute Care Choice:    Choice offered to:     DME Arranged:    DME Agency:     HH Arranged:    HH Agency:     Status of Service:  In process, will continue to follow  If discussed at Long Length of Stay Meetings, dates discussed:    Additional Comments:  Leeroy Cha, RN 05/16/2018, 9:44 AM

## 2018-05-16 NOTE — ED Notes (Signed)
Pt reports intermittent lower abd pain today.  Denies n/v/d.  Has hx of constipation.  Took miralax the other day with result.  Reports mild SOB but denies cp or dizziness.  Pt is A&Ox 4

## 2018-05-16 NOTE — Progress Notes (Signed)
  Echocardiogram 2D Echocardiogram has been performed.  Peter Hayes 05/16/2018, 4:02 PM

## 2018-05-16 NOTE — ED Notes (Signed)
Family at bedside. 

## 2018-05-16 NOTE — Progress Notes (Signed)
PT transferred on bipap from [facility] ED to [facility] ICU- uneventful. RN at bedside.

## 2018-05-16 NOTE — ED Notes (Signed)
Patient transported to CT 

## 2018-05-16 NOTE — H&P (Signed)
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 He was noted to be hypoxic to 75-78% on RA in triage (although improved to 99% on RA prior to any intervention; question if first Pox reading was accurate). Patient denies SOB, Cough, or CP. ABG performed revealing severe hypercapnea. Patient placed on BIPAP and PCCM consulted. CTA Chest revealed no PE and only small bilateral pleural effusions. CT Abdomen revealed diffuse gaseous distension of the colon but no SBO. At time of my exam patient denies SOB but says he is still having "pretty bad" abdominal pain. Attempted to discuss his muscular dystrophy with him including prognosis and his wishes for how aggressive he would like to be with medical care as the disease progresses. Discussed trach and vent versus dnr and hospice. Patient deferred to his father throughout conversation and kept saying he didn't know what he wanted, that he needed to talk to his family. He says that his PCP and Neurologist have never discussed prognosis with him before regarding his muscular dystrophy.   PAST MEDICAL HISTORY :  He  has a past medical history of MD (muscular dystrophy) ([place]) and Refusal of blood product.  PAST SURGICAL HISTORY: He  has a past surgical history that includes Eye surgery and Flexible sigmoidoscopy (N/A, 01/10/2018).  No Known Allergies  No current facility-administered medications on file prior to encounter.    Current Outpatient Medications on File Prior to Encounter  Medication Sig  . acetaminophen (TYLENOL) 325 MG tablet Take 2 tablets (650 mg total) by mouth every  6 (six) hours as needed for mild pain, moderate pain, fever or headache (or temp > 100).  . feeding supplement, ENSURE ENLIVE, (ENSURE ENLIVE) LIQD You can use whatever supplement he likes.  He needs about 1300 calories per day. 1.5 liters of fluid and 50 grams of protein per day. You can buy this at the grocery or drug store.  You do not need a prescription.  . Multiple Vitamin (MULTIVITAMIN WITH MINERALS) TABS tablet You can get him a general multivitamin for daily use at any drug store  . polyethylene glycol (MIRALAX / GLYCOLAX) packet Follow package instruction for daily use.  You need to have one soft bowel movement per day.  If he is not doing this call your primary care doctor.  You also need to be sure he takes in 1.5 liters of fluid per day.   He needs protein supplement daily, and needs to take in about 1300 calories per day.   FAMILY HISTORY:  His family history is not on file.  SOCIAL HISTORY: He  reports that he has never smoked. He has never used smokeless tobacco. He reports that he does not drink alcohol or use drugs.  REVIEW OF SYSTEMS:   Review of Systems  Constitutional: Negative.   HENT: Negative.   Eyes: Negative.   Respiratory: Negative.   Cardiovascular: Negative.   Gastrointestinal: Positive for abdominal pain and constipation. Negative for diarrhea, nausea and vomiting.  Genitourinary: Negative.   Musculoskeletal: Negative.   Skin: Negative.   Neurological: Negative.   Endo/Heme/Allergies: Negative.   Psychiatric/Behavioral: Negative.    SUBJECTIVE:  Lying in bed on BIPAP  VITAL SIGNS: BP 105/73   Pulse (!) 112   Temp 98.3 F (36.8 C) (Oral)   Resp 16   Wt 43.1 kg   SpO2 98%   BMI 18.55 kg/m   INTAKE / OUTPUT: No intake/output data recorded.  PHYSICAL EXAMINATION: General: Frail young adult male, critically ill on BIPAP Neuro: Awake/alert, answering questions, obeying commands, cachectic with diffuse muscle atrophy from his muscular  dystrophy HEENT: OP clear, MM moist  Cardiovascular: Tachycardic with a regular rhythm, no m/r/g Lungs: Diffusely decreased breath sounds bilaterally; no crackles/wheezes/rhonchi Abdomen: distended; TTP diffusely with guarding, Absent bowel sounds  Musculoskeletal: muscle atrophy; no edema  Skin: no rashes   LABS:  BMET Recent Labs  Lab 05/15/18 2327  NA 138  K 5.6*  CL 91*  CO2 34*  BUN 10  CREATININE <0.30*  GLUCOSE 99   Electrolytes Recent Labs  Lab 05/15/18 2327  CALCIUM 8.6*   CBC Recent Labs  Lab 05/15/18 2327  WBC 4.8  HGB 16.6  HCT 53.7*  PLT 146*   Coag's No results for input(s): APTT, INR in the last 168 hours.  Sepsis Markers Recent Labs  Lab 05/15/18 2336  LATICACIDVEN 1.14   ABG Recent Labs  Lab 05/16/18 0240  PHART 7.185*  PCO2ART 113*  PO2ART 278*   Liver Enzymes Recent Labs  Lab 05/15/18 2327  AST 40  ALT 39  ALKPHOS 46  BILITOT 0.9  ALBUMIN 3.8   Cardiac Enzymes Recent Labs  Lab 05/15/18 2332  TROPONINI 0.03*   Glucose No results for input(s): GLUCAP in the last 168 hours.  Imaging Ct Angio Chest Pe W And/or Wo Contrast  Result Date: 05/16/2018 CLINICAL DATA:  Hypoxia with low oxygen saturation and abdominal pain. White blood cell count 4.8. History of muscular dystrophy. EXAM: CT ANGIOGRAPHY CHEST CT ABDOMEN AND PELVIS WITH CONTRAST TECHNIQUE: Multidetector CT imaging of the chest was performed using the standard protocol during bolus administration of intravenous contrast. Multiplanar CT image reconstructions and MIPs were obtained to evaluate the vascular anatomy. Multidetector CT imaging of the abdomen and pelvis was performed using the standard protocol during bolus administration of intravenous contrast. CONTRAST:  5mL ISOVUE-370 IOPAMIDOL (ISOVUE-370) INJECTION 76% COMPARISON:  CT AP 01/10/2018 FINDINGS: CTA CHEST FINDINGS Cardiovascular: Satisfactory opacification of the thoracic aorta and pulmonary arteries to the  segmental level. No pulmonary embolus. No aortic aneurysm or dissection. Heart size is mildly enlarged with pericardial effusion measuring up to 1.4 cm in thickness over the left ventricular apex. Conventional branch pattern of the great vessels without stenosis or dissection. Mediastinum/Nodes: No enlarged mediastinal, hilar, or axillary lymph nodes. Thyroid gland, trachea, and esophagus demonstrate no significant findings. Lungs/Pleura: Small bilateral pleural effusions with adjacent compressive atelectasis. Subpleural faint opacities in the lingula suspicious for pneumonia and/or atelectasis, series 6/63 for example. No dominant pulmonary mass. No pneumothorax. Musculoskeletal: No chest wall abnormality. No acute or significant osseous findings. Review of the MIP images confirms the above findings. CT ABDOMEN and PELVIS FINDINGS Hepatobiliary: No focal liver abnormality is seen. No gallstones, gallbladder wall thickening, or biliary dilatation. Pancreas: Unremarkable. No pancreatic ductal dilatation or surrounding inflammatory changes. Spleen: Normal in size without focal abnormality. Adrenals/Urinary Tract: Adrenal glands are unremarkable. Kidneys are normal, without renal calculi, focal lesion, or hydronephrosis. Mild circumferential mural thickening of the bladder which may reflect stigmata of cystitis. Stomach/Bowel: Fluid-filled distention of the stomach with normal small bowel rotation. No small bowel dilatation or obstruction. A large amount  of gas is seen throughout much of the colon with stool in the right colon. No mural thickening is noted. More liquid stool is seen in the recto sigmoid. Appendix is not confidently identified. Vascular/Lymphatic: Nonaneurysmal abdominal aorta. No lymphadenopathy. Reproductive: Normal size prostate. Other: No free air nor free fluid. Musculoskeletal: Diffuse severe muscle atrophy system with muscular dystrophy. Thoracolumbar levoscoliosis. Review of the MIP images  confirms the above findings. IMPRESSION: CT chest: 1. Small bilateral pleural effusions with adjacent atelectasis. No active pulmonary disease. 2. No acute pulmonary embolus.  No aortic aneurysm or dissection. 3. Small pericardial effusion measuring up to 1.4 cm in thickness. CT AP: 1. No bowel obstruction. Diffuse moderate gaseous distention of the colon similar to prior may represent dysmotility. No definite mechanical bowel obstruction. 2. Diffuse muscle atrophy consistent with history of muscular dystrophy. 3. Slightly thickened appearance of the bladder wall raises the possibility of chronic cystitis. Electronically Signed   By: Tollie Eth M.D.   On: 05/16/2018 02:08   Ct Abdomen Pelvis W Contrast  Result Date: 05/16/2018 CLINICAL DATA:  Hypoxia with low oxygen saturation and abdominal pain. White blood cell count 4.8. History of muscular dystrophy. EXAM: CT ANGIOGRAPHY CHEST CT ABDOMEN AND PELVIS WITH CONTRAST TECHNIQUE: Multidetector CT imaging of the chest was performed using the standard protocol during bolus administration of intravenous contrast. Multiplanar CT image reconstructions and MIPs were obtained to evaluate the vascular anatomy. Multidetector CT imaging of the abdomen and pelvis was performed using the standard protocol during bolus administration of intravenous contrast. CONTRAST:  54mL ISOVUE-370 IOPAMIDOL (ISOVUE-370) INJECTION 76% COMPARISON:  CT AP 01/10/2018 FINDINGS: CTA CHEST FINDINGS Cardiovascular: Satisfactory opacification of the thoracic aorta and pulmonary arteries to the segmental level. No pulmonary embolus. No aortic aneurysm or dissection. Heart size is mildly enlarged with pericardial effusion measuring up to 1.4 cm in thickness over the left ventricular apex. Conventional branch pattern of the great vessels without stenosis or dissection. Mediastinum/Nodes: No enlarged mediastinal, hilar, or axillary lymph nodes. Thyroid gland, trachea, and esophagus demonstrate no  significant findings. Lungs/Pleura: Small bilateral pleural effusions with adjacent compressive atelectasis. Subpleural faint opacities in the lingula suspicious for pneumonia and/or atelectasis, series 6/63 for example. No dominant pulmonary mass. No pneumothorax. Musculoskeletal: No chest wall abnormality. No acute or significant osseous findings. Review of the MIP images confirms the above findings. CT ABDOMEN and PELVIS FINDINGS Hepatobiliary: No focal liver abnormality is seen. No gallstones, gallbladder wall thickening, or biliary dilatation. Pancreas: Unremarkable. No pancreatic ductal dilatation or surrounding inflammatory changes. Spleen: Normal in size without focal abnormality. Adrenals/Urinary Tract: Adrenal glands are unremarkable. Kidneys are normal, without renal calculi, focal lesion, or hydronephrosis. Mild circumferential mural thickening of the bladder which may reflect stigmata of cystitis. Stomach/Bowel: Fluid-filled distention of the stomach with normal small bowel rotation. No small bowel dilatation or obstruction. A large amount of gas is seen throughout much of the colon with stool in the right colon. No mural thickening is noted. More liquid stool is seen in the recto sigmoid. Appendix is not confidently identified. Vascular/Lymphatic: Nonaneurysmal abdominal aorta. No lymphadenopathy. Reproductive: Normal size prostate. Other: No free air nor free fluid. Musculoskeletal: Diffuse severe muscle atrophy system with muscular dystrophy. Thoracolumbar levoscoliosis. Review of the MIP images confirms the above findings. IMPRESSION: CT chest: 1. Small bilateral pleural effusions with adjacent atelectasis. No active pulmonary disease. 2. No acute pulmonary embolus.  No aortic aneurysm or dissection. 3. Small pericardial effusion measuring up to 1.4 cm in thickness. CT AP: 1.  No bowel obstruction. Diffuse moderate gaseous distention of the colon similar to prior may represent dysmotility. No  definite mechanical bowel obstruction. 2. Diffuse muscle atrophy consistent with history of muscular dystrophy. 3. Slightly thickened appearance of the bladder wall raises the possibility of chronic cystitis. Electronically Signed   By: Ashley Royalty M.D.   On: 05/16/2018 02:08   Dg Chest Portable 1 View  Result Date: 05/16/2018 CLINICAL DATA:  Acute onset of generalized abdominal pain and chest pain. EXAM: PORTABLE CHEST 1 VIEW COMPARISON:  Chest radiograph performed 12/05/2017 FINDINGS: The lungs are mildly hypoexpanded. Apparent vascular congestion is noted. No pleural effusion or pneumothorax is seen. There is no evidence of focal opacification, pleural effusion or pneumothorax. The cardiomediastinal silhouette is within normal limits. No acute osseous abnormalities are seen. IMPRESSION: Apparent vascular congestion noted. Lungs mildly hypoexpanded but grossly clear. Electronically Signed   By: Garald Balding M.D.   On: 05/16/2018 00:15   Dg Abd Portable 2 Views  Result Date: 05/16/2018 CLINICAL DATA:  Acute onset of generalized abdominal pain. EXAM: PORTABLE ABDOMEN - 2 VIEW COMPARISON:  Abdominal radiograph performed 01/13/2018 FINDINGS: There is diffuse distention of the colon with air, possibly reflecting mild ileus; a large amount of stool at the pelvis could reflect fecal impaction. No abnormal dilatation of small bowel loops is seen to suggest small bowel obstruction. No free intra-abdominal air is identified on the provided decubitus view, though evaluation is somewhat suboptimal due to the degree of penetration. The visualized osseous structures are within normal limits; the sacroiliac joints are unremarkable in appearance. The visualized lung bases are essentially clear. IMPRESSION: Diffuse distention of the colon with air, possibly reflecting mild ileus; a large amount of stool at the pelvis could reflect fecal impaction. No abnormal dilatation of small bowel loops seen to suggest small bowel  obstruction. No definite free intra-abdominal air seen. Electronically Signed   By: Garald Balding M.D.   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 He was noted to be hypoxic and hypercapneic requiring initiation of BIPAP. CT Abdomen revealed colonic ileus.  ASSESSMENT / PLAN:  PULMONARY 1. Acute Hypoxic and Acute-on-Chronic Hypercapneic Respiratory failure; Respiratory muscle weakness; Pleural effusions: - ABG revealed mild hypoxic and severe acute-on-chronic hypercapneic respiratory failure; started on BIPAP with IPAP 14, EPAP 6, FIO2 30%. Vt only 200-220 on these settings. Repeat ABG after 2hrs of BIPAP revealed only mildly improved hypercapnea. - Increased BIPAP settings to IPAP 18 and EPAP 8. Vt now 350-400. Will repeat ABG in 2 hours. - CTA Chest revealed no PE or Pneumonia that could explain this new worsened hypercapnea. Patient and his father deny any narcotic medication use or other sedatives. Certainly the abdominal distension from the ileus is playing some part in the hypercapnea, as is his muscular dystrophy. If hypercapnea does not improve with treatment of the ileus, may need to re-address goals of care. Attempted to discuss his muscular dystrophy and prognosis with him; he says no one has ever talked to him about this before. Asked if he would want trach and peg but he says he is not sure. Discussed  alternative is dnr and home with hospice. He wants to talk to his family more about this before making a decision.  - Consult Palliative care - Small pleural effusions  seen on CTA Chest but patient does not appear clinically volume overloaded. Will check a BNP. Order TTE.   CARDIOVASCULAR 1. Sinus tachycardia: likely due to abd pain; continue to monitor  RENAL 1. Hyperkalemia; Pre-renal azotemia: - while creatinine is normal, patient has almost no muscle mass. So a normal creatinine does not rule out possibility of AKI. His BUN has doubled from 5 to 10 compared to prior results; also now has hyperkalemia with K 5.6. Feel he is most likely volume depleted due to poor PO intake in setting of ileus and pain. Hgb also up form 13 to 16, also consistent with volume contraction. Agree with IVF's that ER has given.   GASTROINTESTINAL 1. Abdominal Pain; Colonic Ileus: - CT Abdomen shows diffuse gaseous distention of colon consistent with an ileus; no SBO seen - Manual disimpaction unsuccessful; abdomen still very tender and distended. Will place rectal tube - bisacodyl suppository and mineral oil enema PRN - remain NPO - avoid narcotics if possible as will likely exacerbate the problem; give IV Acetaminophen PRN  HEMATOLOGIC No active issues   INFECTIOUS No active issues  ENDOCRINE No active issues   NEUROLOGIC 1. Muscular Dystrophy: - unclear where he receives his outpatient care; no mention of it in our EMR or in Care everywhere - palliative care consult to help discuss goals of care   FAMILY  - Updated patient's father at the bedside - Inter-disciplinary family meet or Palliative Care meeting due by: 05/22/18  60 minutes critical care time  Vernie Murders, MD  Pulmonary and [place] Pager: [phone]  05/16/2018, 6:28 AM

## 2018-05-16 NOTE — ED Notes (Addendum)
Attempted to disimpact Pt per MDs order. Pt did not have any stool. Notified Admit MD

## 2018-05-16 NOTE — Progress Notes (Signed)
Pt and family very hesitant about pt wearing BIPAP QHS.  RT explained the importance of utilizing but Pt and family is unsure at this time.  Family to notify RT if they decide to try tonight.  RT to monitor and assess as needed.

## 2018-05-16 NOTE — ED Notes (Signed)
CRITICAL VALUE STICKER  CRITICAL VALUE: Trop 0.03  RECEIVER (on-site recipient of call): Jake T RN  DATE & TIME NOTIFIED: 05/16/18 1a  MESSENGER (representative from lab): Lelan Pons  MD NOTIFIED: [place]  TIME OF NOTIFICATION: 1a  RESPONSE: see orders

## 2018-05-16 NOTE — ED Notes (Signed)
Delay in transport due to pt receiving breathing treatment as well as a low cbg. Critical care dr. Paged, waiting on orders. ICU nurse notified of delay.

## 2018-05-17 DIAGNOSIS — K56699 Other intestinal obstruction unspecified as to partial versus complete obstruction: Secondary | ICD-10-CM

## 2018-05-17 LAB — BLOOD GAS, ARTERIAL
ACID-BASE EXCESS: 7.4 mmol/L — AB (ref 0.0–2.0)
Bicarbonate: 39.3 mmol/L — ABNORMAL HIGH (ref 20.0–28.0)
DRAWN BY: 308601
O2 Content: 2 L/min
O2 Saturation: 99.1 %
PH ART: 7.234 — AB (ref 7.350–7.450)
Patient temperature: 98.2
pCO2 arterial: 95.9 mmHg (ref 32.0–48.0)
pO2, Arterial: 157 mmHg — ABNORMAL HIGH (ref 83.0–108.0)

## 2018-05-17 LAB — BASIC METABOLIC PANEL
Anion gap: 10 (ref 5–15)
BUN: <5 mg/dL — ABNORMAL LOW (ref 6–20)
CALCIUM: 8.1 mg/dL — AB (ref 8.9–10.3)
CO2: 36 mmol/L — ABNORMAL HIGH (ref 22–32)
Chloride: 95 mmol/L — ABNORMAL LOW (ref 98–111)
Creatinine, Ser: <0.3 mg/dL — ABNORMAL LOW (ref 0.61–1.24)
Glucose, Bld: 87 mg/dL (ref 70–99)
Potassium: 5.4 mmol/L — ABNORMAL HIGH (ref 3.5–5.1)
SODIUM: 141 mmol/L (ref 135–145)

## 2018-05-17 LAB — MAGNESIUM: MAGNESIUM: 1.6 mg/dL — AB (ref 1.7–2.4)

## 2018-05-17 LAB — CBC
HEMATOCRIT: 52.9 % — AB (ref 39.0–52.0)
Hemoglobin: 16.5 g/dL (ref 13.0–17.0)
MCH: 28.4 pg (ref 26.0–34.0)
MCHC: 31.2 g/dL (ref 30.0–36.0)
MCV: 91.2 fL (ref 78.0–100.0)
PLATELETS: 127 10*3/uL — AB (ref 150–400)
RBC: 5.8 MIL/uL (ref 4.22–5.81)
RDW: 14.6 % (ref 11.5–15.5)
WBC: 5.2 10*3/uL (ref 4.0–10.5)

## 2018-05-17 LAB — URINE CULTURE: CULTURE: NO GROWTH

## 2018-05-17 LAB — GLUCOSE, CAPILLARY
GLUCOSE-CAPILLARY: 124 mg/dL — AB (ref 70–99)
Glucose-Capillary: 84 mg/dL (ref 70–99)
Glucose-Capillary: 82 mg/dL (ref 70–99)

## 2018-05-17 LAB — PHOSPHORUS: PHOSPHORUS: 3.6 mg/dL (ref 2.5–4.6)

## 2018-05-17 MED ORDER — BISACODYL 10 MG RE SUPP
10.0000 mg | Freq: Every day | RECTAL | Status: DC
  Administered 2018-05-18: 10 mg via RECTAL
  Filled 2018-05-17: qty 1

## 2018-05-17 MED ORDER — FENTANYL CITRATE (PF) 100 MCG/2ML IJ SOLN
25.0000 ug | Freq: Once | INTRAMUSCULAR | Status: AC
  Administered 2018-05-17: 25 ug via INTRAVENOUS
  Filled 2018-05-17: qty 2

## 2018-05-17 NOTE — Progress Notes (Addendum)
PULMONARY / CRITICAL CARE MEDICINE   Name: Almond Fitzgibbon MRN: [id number] DOB: Jul 04, 1996    ADMISSION DATE:  05/15/2018  REFERRING MD:  Dr. Wyvonnia Dusky  CHIEF COMPLAINT: Abdominal pain  HISTORY OF PRESENT ILLNESS:   22 yo male presented to ED with abdominal pain.  Has hx of Duchenne's muscular dystrophy.  Was hypoxic, hypercapnic in ER and placed on Bipap.  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  CT abdomen showed gaseous distention of the colon.  PAST MEDICAL HISTORY :  Jehovah's Witness  STUDIES:  CT angio chest 8/09 >> mild CM, pericardial effusion, small b/l pleural effusions, ATX (reviewed images by me personally, and impression is my own after personally reviewing images independently by me personally) CT abd/pelvis 8/09 >> large amount of gas throughout colon with stool in Rt colon, no murali thickening, no obstruction Echo 8/09 >> nml LV fn, pericardial effusion but no tamponade, RV enalrged  CULTURES: Blood 8/09 >> Urine 8/09 >>  ANTIBIOTICS: Rocephin 8/09 >> 8/09  SIGNIFICANT EVENTS: 8/09 Admit    LINES/TUBES:    SUBJECTIVE:  8/10 given 25 mcg fent for persistent abd pain >> developed dizziness, father concerned about reaction to medication Did not tolerate bipap well - used x 2 h Awake & refusing blood draw by phlebotomy  VITAL SIGNS: BP (!) 92/45   Pulse 99   Temp 97.9 F (36.6 C) (Oral)   Resp 12   Ht $R'5\' 6"'Dn$  (1.676 m)   Wt 37 kg   SpO2 99%   BMI 13.17 kg/m   VENTILATOR SETTINGS: FiO2 (%):  [30 %] 30 %  INTAKE / OUTPUT: I/O last 3 completed shifts: In: 2975.8 [I.V.:875.8; IV [id number]] Out: 300 [Urine:300]  PHYSICAL EXAMINATION:  Gen. Pleasant, poorly-nourished, in no distress, normal affect ENT - no pallor, icterus,, no post nasal drip Neck: No JVD, no thyromegaly, no carotid bruits Lungs: no use of accessory muscles, no dullness to percussion, clear  without rales or rhonchi  Cardiovascular: Rhythm regular, heart sounds  normal, no murmurs or gallops, no peripheral edema Abdomen: soft and non-tender, no hepatosplenomegaly, BS normal. Musculoskeletal: contractures + no cyanosis or clubbing Neuro:  alert, non focal,     LABS:  BMET Recent Labs  Lab 05/15/18 2327  NA 138  K 5.6*  CL 91*  CO2 34*  BUN 10  CREATININE <0.30*  GLUCOSE 99    Electrolytes Recent Labs  Lab 05/15/18 2327  CALCIUM 8.6*    CBC Recent Labs  Lab 05/15/18 2327  WBC 4.8  HGB 16.6  HCT 53.7*  PLT 146*    Coag's No results for input(s): APTT, INR in the last 168 hours.  Sepsis Markers Recent Labs  Lab 05/15/18 2336 05/16/18 0921  LATICACIDVEN 1.14  --   PROCALCITON  --  <0.10    ABG Recent Labs  Lab 05/16/18 0240 05/16/18 0817 05/17/18 0235  PHART 7.185* 7.309* 7.234*  PCO2ART 113* 76.2* 95.9*  PO2ART 278* 114* 157*    Liver Enzymes Recent Labs  Lab 05/15/18 2327  AST 40  ALT 39  ALKPHOS 46  BILITOT 0.9  ALBUMIN 3.8    Cardiac Enzymes Recent Labs  Lab 05/15/18 2332  TROPONINI 0.03*    Glucose Recent Labs  Lab 05/16/18 1158 05/16/18 1554 05/16/18 2311 05/17/18 0011 05/17/18 0551 05/17/18 0749  GLUCAP 76 82 64* 124* 84 82    Imaging No results found.  DISCUSSION:  He likely has baseline hypercapnia with acute worsening Due to  recurrent colonic ileus.  but does not tolerate NIV well  ASSESSMENT / PLAN:  Acute on chronic hypoxic/hypercapnic respiratory failure. - oxygen to keep SpO2 > 90% - Bipap qhs and prn, explained to pt & dad that this would prevent mechanical ventilation - will need NIV on discharge  Abdominal pain with recurrent colonic ileus. - sips ok - AVOID narcotics -dulcolax, miralax regimen added  Hypotension likely from hypovolemia. Pericardial effusion. - continue IV fluids  Hyperkalemia. - likely from acidosis - await BMET  DVT prophylaxis - SQ heparin SUP - not  indicated Nutrition - NPO Goals of care - full code  Updated father at bedside  will get palliative care for goals of care discussion given refusal of bipap, phlebotomy  Kara Mead MD. FCCP. [facility] Pager [phone] If no response call 319 [phone]   05/17/2018

## 2018-05-17 NOTE — Progress Notes (Signed)
Pt took off Bipap mask and was put back on 2L [place].

## 2018-05-17 NOTE — Progress Notes (Signed)
Pt requested to go on BIPAP at this time.

## 2018-05-17 NOTE — Progress Notes (Signed)
Pt given 56mcg of Fentanyl to help with stomach idscomfort. Pt c/o dizziness and feeling drunk , not wanting to wear Bipap till he feels better. PT given cool cloth on forehead for comfort. Father concerned about reaction to medication. I reassured them both that it will pass in a few minutes and we will not administer it again. His vitals are stable. Fentanyl documented in the chart as a medication intolerance.

## 2018-05-17 NOTE — Progress Notes (Signed)
Pt agreed to wear Bipap for a couple of hours then wanted a break around 7am. I informed him the longer he wears it the more effective it will be. Pt put on Bipap at previous settings.

## 2018-05-17 NOTE — Progress Notes (Signed)
Pt removed from BIPAP at 2200 due to family request.  Pt placed back on 2 LPM [place], Pt tolerating well at this time.  Pt wore BIPAP for approximately 30 mins, RT to monitor and assess as needed.

## 2018-05-17 NOTE — Progress Notes (Signed)
Pt refusing BIPAP at this time, pt states that the BIPAP makes his stomach hurt.  Pt and father made aware of importance of BIPAP but is still refusing.  RN made aware, RT to monitor and assess as needed.

## 2018-05-17 NOTE — Progress Notes (Signed)
Pt requested to go back on BIPAP at this time.

## 2018-05-17 NOTE — Progress Notes (Signed)
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  I doubt if he has sigmoid volvulus on this admission. Plan: -MiraLAX 17 g p.o. twice daily and continue Dulcolax. -Clear liquid diet -Serial x-ray KUBs -Monitor and correct electrolytes keep Mg>2 (K already elevated) -Avoid pain meds esp narcotics. -will follow along.      HPI:    Chief Complaint:   Peter Hayes is a 22 y.o. male  Seen this morning Able to tolerate clear liquids Continued on MiraLAX Had small bowel movements but had flatus No nausea/vomiting. No significant abdominal pain. Abdominal distention better per family.   Past Medical History:  Diagnosis Date  . MD (muscular dystrophy) ([place])   . Refusal of blood product    Peter Hayes is Peter Hayes witness    Current Facility-Administered Medications  Medication Dose Route Frequency Provider Last Rate Last Dose  . albuterol (PROVENTIL) (2.5 MG/3ML) 0.083% nebulizer solution 2.5 mg  2.5 mg Nebulization TID Etta Quill, DO   2.5 mg at 05/17/18 1428  . bisacodyl (DULCOLAX) suppository 10 mg  10 mg Rectal Daily Rigoberto Noel, MD   Stopped at 05/17/18 1746  . dextrose 5 %-0.45 % sodium chloride infusion   Intravenous Continuous Chesley Mires, MD 50 mL/hr at 05/17/18 0600    . heparin injection 5,000 Units  5,000 Units Subcutaneous Q8H Hammonds, Sharyn Blitz, MD   5,000 Units at 05/17/18 1455  . mineral oil enema 1 enema  1 enema Rectal Daily PRN Hammonds, Sharyn Blitz, MD      . ondansetron [facility]) injection 4 mg  4 mg Intravenous Q6H PRN Hammonds, Sharyn Blitz, MD      . polyethylene glycol (MIRALAX / GLYCOLAX) packet 17 g  17 g Oral Daily Carol Ada, MD   17 g at 05/17/18 0915    Past Surgical History:  Procedure Laterality Date  . EYE SURGERY    . FLEXIBLE SIGMOIDOSCOPY N/A 01/10/2018   Procedure: FLEXIBLE  SIGMOIDOSCOPY;  Surgeon: Laurence Spates, MD;  Location: [facility];  Service: Endoscopy;  Laterality: N/A;    No family history on file.  Social History   Tobacco Use  . Smoking status: Never Smoker  . Smokeless tobacco: Never Used  Substance Use Topics  . Alcohol use: No  . Drug use: No    Allergies  Allergen Reactions  . Fentanyl Other (See Comments)    Dizziness     Review of Systems: All systems reviewed and negative except where noted in HPI.    Physical Exam:     BP (!) 101/59   Pulse 99   Temp 98.1 F (36.7 C) (Oral)   Resp 19   Ht $R'5\' 6"'ig$  (1.676 m)   Wt 37 kg   SpO2 98%   BMI 13.17 kg/m   ABDOMEN: Inspection: No visible peristalsis, no abnormal pulsations, skin normal.  Mildly distended, faint bowel sounds. Rectal exam: Deferred   Data Reviewed: I have personally reviewed following labs and imaging studies  CBC: Recent Labs  Lab 05/15/18 2327 05/17/18 0500  WBC 4.8 5.2  NEUTROABS 2.8  --   HGB 16.6 16.5  HCT 53.7* 52.9*  MCV 93.1 91.2  PLT 146* 009*   Basic Metabolic Panel: Recent Labs  Lab 05/15/18 2327 05/17/18 1519  NA 138 141  K 5.6* 5.4*  CL 91* 95*  CO2 34* 36*  GLUCOSE  99 87  BUN 10 <5*  CREATININE <0.30* <0.30*  CALCIUM 8.6* 8.1*  MG  --  1.6*  PHOS  --  3.6   GFR: CrCl cannot be calculated (This lab value cannot be used to calculate CrCl because it is not a number: <0.30). Liver Function Tests: Recent Labs  Lab 05/15/18 2327  AST 40  ALT 39  ALKPHOS 46  BILITOT 0.9  PROT 6.5  ALBUMIN 3.8   Recent Labs  Lab 05/15/18 2327  LIPASE 24   No results for input(s): AMMONIA in the last 168 hours. Coagulation Profile: No results for input(s): INR, PROTIME in the last 168 hours. HbA1C: No results for input(s): HGBA1C in the last 72 hours. Lipid Profile: No results for input(s): CHOL, HDL, LDLCALC, TRIG, CHOLHDL, LDLDIRECT in the last 72 hours. Thyroid Function Tests: No results for input(s): TSH, T4TOTAL,  FREET4, T3FREE, THYROIDAB in the last 72 hours. Anemia Panel: No results for input(s): VITAMINB12, FOLATE, FERRITIN, TIBC, IRON, RETICCTPCT in the last 72 hours.  Recent Results (from the past 240 hour(s))  Blood culture (routine x 2)     Status: None (Preliminary result)   Collection Time: 05/15/18 11:25 PM  Result Value Ref Range Status   Specimen Description   Final    BLOOD LEFT FOREARM Performed at [facility], [address], [place], [place] [address]    Special Requests   Final    BOTTLES DRAWN AEROBIC AND ANAEROBIC Blood Culture adequate volume Performed at [place] [address] [place], [place] [address]    Culture   Final    NO GROWTH 1 DAY Performed at [facility], [place] [address] [place], [place] [address]    Report Status PENDING  Incomplete  Urine Culture     Status: None   Collection Time: 05/16/18  3:03 AM  Result Value Ref Range Status   Specimen Description   Final    URINE, CLEAN CATCH Performed at [facility], [place] [address] [place], [place] [address]    Special Requests   Final    NONE Performed at [facility], [place] [address] [place], [place] [address]    Culture   Final    NO GROWTH Performed at [facility], [place] [address] [place], [place] [address]    Report Status 05/17/2018 FINAL  Final  MRSA PCR Screening     Status: None   Collection Time: 05/16/18 10:19 AM  Result Value Ref Range Status   MRSA by PCR NEGATIVE NEGATIVE Final    Comment:        The GeneXpert MRSA Assay (FDA approved for NASAL specimens only), is one component of a comprehensive MRSA colonization surveillance program. It is not intended to diagnose MRSA infection nor to guide or monitor treatment for MRSA infections. Performed at [facility], [place] [address] [place], [place] [address]       Radiology Studies: Ct Angio Chest Pe W And/or Wo Contrast  Result  Date: 05/16/2018 CLINICAL DATA:  Hypoxia with low oxygen saturation and abdominal pain. White blood cell count 4.8. History of muscular dystrophy. EXAM: CT ANGIOGRAPHY CHEST CT ABDOMEN AND PELVIS WITH CONTRAST TECHNIQUE: Multidetector CT imaging of the chest was performed using the standard protocol during bolus administration of intravenous contrast. Multiplanar CT image reconstructions and MIPs were obtained to evaluate the vascular anatomy. Multidetector CT imaging of the abdomen and pelvis was performed using the standard  protocol during bolus administration of intravenous contrast. CONTRAST:  60mL ISOVUE-370 IOPAMIDOL (ISOVUE-370) INJECTION 76% COMPARISON:  CT AP 01/10/2018 FINDINGS: CTA CHEST FINDINGS Cardiovascular: Satisfactory opacification of the thoracic aorta and pulmonary arteries to the segmental level. No pulmonary embolus. No aortic aneurysm or dissection. Heart size is mildly enlarged with pericardial effusion measuring up to 1.4 cm in thickness over the left ventricular apex. Conventional branch pattern of the great vessels without stenosis or dissection. Mediastinum/Nodes: No enlarged mediastinal, hilar, or axillary lymph nodes. Thyroid gland, trachea, and esophagus demonstrate no significant findings. Lungs/Pleura: Small bilateral pleural effusions with adjacent compressive atelectasis. Subpleural faint opacities in the lingula suspicious for pneumonia and/or atelectasis, series 6/63 for example. No dominant pulmonary mass. No pneumothorax. Musculoskeletal: No chest wall abnormality. No acute or significant osseous findings. Review of the MIP images confirms the above findings. CT ABDOMEN and PELVIS FINDINGS Hepatobiliary: No focal liver abnormality is seen. No gallstones, gallbladder wall thickening, or biliary dilatation. Pancreas: Unremarkable. No pancreatic ductal dilatation or surrounding inflammatory changes. Spleen: Normal in size without focal abnormality. Adrenals/Urinary Tract: Adrenal  glands are unremarkable. Kidneys are normal, without renal calculi, focal lesion, or hydronephrosis. Mild circumferential mural thickening of the bladder which may reflect stigmata of cystitis. Stomach/Bowel: Fluid-filled distention of the stomach with normal small bowel rotation. No small bowel dilatation or obstruction. A large amount of gas is seen throughout much of the colon with stool in the right colon. No mural thickening is noted. More liquid stool is seen in the recto sigmoid. Appendix is not confidently identified. Vascular/Lymphatic: Nonaneurysmal abdominal aorta. No lymphadenopathy. Reproductive: Normal size prostate. Other: No free air nor free fluid. Musculoskeletal: Diffuse severe muscle atrophy system with muscular dystrophy. Thoracolumbar levoscoliosis. Review of the MIP images confirms the above findings. IMPRESSION: CT chest: 1. Small bilateral pleural effusions with adjacent atelectasis. No active pulmonary disease. 2. No acute pulmonary embolus.  No aortic aneurysm or dissection. 3. Small pericardial effusion measuring up to 1.4 cm in thickness. CT AP: 1. No bowel obstruction. Diffuse moderate gaseous distention of the colon similar to prior may represent dysmotility. No definite mechanical bowel obstruction. 2. Diffuse muscle atrophy consistent with history of muscular dystrophy. 3. Slightly thickened appearance of the bladder wall raises the possibility of chronic cystitis. Electronically Signed   By: Ashley Royalty M.D.   On: 05/16/2018 02:08   Ct Abdomen Pelvis W Contrast  Result Date: 05/16/2018 CLINICAL DATA:  Hypoxia with low oxygen saturation and abdominal pain. White blood cell count 4.8. History of muscular dystrophy. EXAM: CT ANGIOGRAPHY CHEST CT ABDOMEN AND PELVIS WITH CONTRAST TECHNIQUE: Multidetector CT imaging of the chest was performed using the standard protocol during bolus administration of intravenous contrast. Multiplanar CT image reconstructions and MIPs were obtained to  evaluate the vascular anatomy. Multidetector CT imaging of the abdomen and pelvis was performed using the standard protocol during bolus administration of intravenous contrast. CONTRAST:  23mL ISOVUE-370 IOPAMIDOL (ISOVUE-370) INJECTION 76% COMPARISON:  CT AP 01/10/2018 FINDINGS: CTA CHEST FINDINGS Cardiovascular: Satisfactory opacification of the thoracic aorta and pulmonary arteries to the segmental level. No pulmonary embolus. No aortic aneurysm or dissection. Heart size is mildly enlarged with pericardial effusion measuring up to 1.4 cm in thickness over the left ventricular apex. Conventional branch pattern of the great vessels without stenosis or dissection. Mediastinum/Nodes: No enlarged mediastinal, hilar, or axillary lymph nodes. Thyroid gland, trachea, and esophagus demonstrate no significant findings. Lungs/Pleura: Small bilateral pleural effusions with adjacent compressive atelectasis. Subpleural faint opacities in the lingula suspicious  for pneumonia and/or atelectasis, series 6/63 for example. No dominant pulmonary mass. No pneumothorax. Musculoskeletal: No chest wall abnormality. No acute or significant osseous findings. Review of the MIP images confirms the above findings. CT ABDOMEN and PELVIS FINDINGS Hepatobiliary: No focal liver abnormality is seen. No gallstones, gallbladder wall thickening, or biliary dilatation. Pancreas: Unremarkable. No pancreatic ductal dilatation or surrounding inflammatory changes. Spleen: Normal in size without focal abnormality. Adrenals/Urinary Tract: Adrenal glands are unremarkable. Kidneys are normal, without renal calculi, focal lesion, or hydronephrosis. Mild circumferential mural thickening of the bladder which may reflect stigmata of cystitis. Stomach/Bowel: Fluid-filled distention of the stomach with normal small bowel rotation. No small bowel dilatation or obstruction. A large amount of gas is seen throughout much of the colon with stool in the right colon. No  mural thickening is noted. More liquid stool is seen in the recto sigmoid. Appendix is not confidently identified. Vascular/Lymphatic: Nonaneurysmal abdominal aorta. No lymphadenopathy. Reproductive: Normal size prostate. Other: No free air nor free fluid. Musculoskeletal: Diffuse severe muscle atrophy system with muscular dystrophy. Thoracolumbar levoscoliosis. Review of the MIP images confirms the above findings. IMPRESSION: CT chest: 1. Small bilateral pleural effusions with adjacent atelectasis. No active pulmonary disease. 2. No acute pulmonary embolus.  No aortic aneurysm or dissection. 3. Small pericardial effusion measuring up to 1.4 cm in thickness. CT AP: 1. No bowel obstruction. Diffuse moderate gaseous distention of the colon similar to prior may represent dysmotility. No definite mechanical bowel obstruction. 2. Diffuse muscle atrophy consistent with history of muscular dystrophy. 3. Slightly thickened appearance of the bladder wall raises the possibility of chronic cystitis. Electronically Signed   By: Ashley Royalty M.D.   On: 05/16/2018 02:08   Dg Chest Portable 1 View  Result Date: 05/16/2018 CLINICAL DATA:  Acute onset of generalized abdominal pain and chest pain. EXAM: PORTABLE CHEST 1 VIEW COMPARISON:  Chest radiograph performed 12/05/2017 FINDINGS: The lungs are mildly hypoexpanded. Apparent vascular congestion is noted. No pleural effusion or pneumothorax is seen. There is no evidence of focal opacification, pleural effusion or pneumothorax. The cardiomediastinal silhouette is within normal limits. No acute osseous abnormalities are seen. IMPRESSION: Apparent vascular congestion noted. Lungs mildly hypoexpanded but grossly clear. Electronically Signed   By: Garald Balding M.D.   On: 05/16/2018 00:15   Dg Abd Portable 2 Views  Result Date: 05/16/2018 CLINICAL DATA:  Acute onset of generalized abdominal pain. EXAM: PORTABLE ABDOMEN - 2 VIEW COMPARISON:  Abdominal radiograph performed  01/13/2018 FINDINGS: There is diffuse distention of the colon with air, possibly reflecting mild ileus; a large amount of stool at the pelvis could reflect fecal impaction. No abnormal dilatation of small bowel loops is seen to suggest small bowel obstruction. No free intra-abdominal air is identified on the provided decubitus view, though evaluation is somewhat suboptimal due to the degree of penetration. The visualized osseous structures are within normal limits; the sacroiliac joints are unremarkable in appearance. The visualized lung bases are essentially clear. IMPRESSION: Diffuse distention of the colon with air, possibly reflecting mild ileus; a large amount of stool at the pelvis could reflect fecal impaction. No abnormal dilatation of small bowel loops seen to suggest small bowel obstruction. No definite free intra-abdominal air seen. Electronically Signed   By: Garald Balding M.D.   On: 05/16/2018 00:16      Birtie Fellman,MD 05/17/2018, 7:21 PM   CC No ref. provider found

## 2018-05-17 NOTE — Progress Notes (Signed)
Pt refused to do his breathing tx and NIF. No distress noted at this time. Family at bedside.

## 2018-05-17 NOTE — Progress Notes (Signed)
Pt only wore BIPAP for approximately 30 mins.

## 2018-05-17 NOTE — Progress Notes (Signed)
Pt unable to perform NIF effectively to get a measurement.  RT to monitor and assess as needed.

## 2018-05-18 ENCOUNTER — Inpatient Hospital Stay (HOSPITAL_COMMUNITY): Payer: Managed Care, Other (non HMO)

## 2018-05-18 DIAGNOSIS — N179 Acute kidney failure, unspecified: Secondary | ICD-10-CM

## 2018-05-18 DIAGNOSIS — E875 Hyperkalemia: Secondary | ICD-10-CM

## 2018-05-18 DIAGNOSIS — J9622 Acute and chronic respiratory failure with hypercapnia: Secondary | ICD-10-CM

## 2018-05-18 DIAGNOSIS — J9621 Acute and chronic respiratory failure with hypoxia: Secondary | ICD-10-CM

## 2018-05-18 LAB — GLUCOSE, CAPILLARY
GLUCOSE-CAPILLARY: 141 mg/dL — AB (ref 70–99)
Glucose-Capillary: 86 mg/dL (ref 70–99)
Glucose-Capillary: 124 mg/dL — ABNORMAL HIGH (ref 70–99)

## 2018-05-18 MED ORDER — LACTATED RINGERS IV SOLN
INTRAVENOUS | Status: DC
  Administered 2018-05-18 – 2018-05-19 (×2): via INTRAVENOUS

## 2018-05-18 MED ORDER — SODIUM CHLORIDE 0.9 % IV BOLUS
1000.0000 mL | Freq: Once | INTRAVENOUS | Status: AC
  Administered 2018-05-18: 1000 mL via INTRAVENOUS

## 2018-05-18 MED ORDER — ALBUTEROL SULFATE (2.5 MG/3ML) 0.083% IN NEBU
2.5000 mg | INHALATION_SOLUTION | Freq: Two times a day (BID) | RESPIRATORY_TRACT | Status: DC
  Administered 2018-05-19 (×2): 2.5 mg via RESPIRATORY_TRACT
  Filled 2018-05-18 (×3): qty 3

## 2018-05-18 MED ORDER — MAGNESIUM OXIDE 400 (241.3 MG) MG PO TABS
800.0000 mg | ORAL_TABLET | Freq: Once | ORAL | Status: AC
  Administered 2018-05-18: 800 mg via ORAL
  Filled 2018-05-18: qty 2

## 2018-05-18 NOTE — Progress Notes (Signed)
PULMONARY / CRITICAL CARE MEDICINE   Name: Peter Hayes MRN: [id number] DOB: Nov 13, 1995    ADMISSION DATE:  05/15/2018  REFERRING MD:  Dr. Wyvonnia Dusky  CHIEF COMPLAINT: Abdominal pain  HISTORY OF PRESENT ILLNESS:   22 yo male presented to ED with abdominal pain.  Has hx of Duchenne's muscular dystrophy.  Was hypoxic, hypercapnic in ER and placed on Bipap.  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  CT abdomen showed gaseous distention of the colon.  PAST MEDICAL HISTORY :  [employer] Witness  STUDIES:  CT angio chest 8/09 >> mild CM, pericardial effusion, small b/l pleural effusions, ATX (reviewed images by me personally, and impression is my own after personally reviewing images independently by me personally) CT abd/pelvis 8/09 >> large amount of gas throughout colon with stool in Rt colon, no murali thickening, no obstruction Echo 8/09 >> nml LV fn, pericardial effusion but no tamponade, RV enalrged  CULTURES: Blood 8/09 >> no growth to date 05/18/2018>> Urine 8/09 >> negative  ANTIBIOTICS: Rocephin 8/09 >> 8/09  SIGNIFICANT EVENTS: 8/09 Admit    LINES/TUBES:    SUBJECTIVE:  05/18/2018 no acute distress.  Most likely exacerbated with normal hypercarbic state.  Been changed to CPAP.  Suspect he would be okay if not given narcotics.  VITAL SIGNS: BP (!) 101/43   Pulse 96   Temp 97.8 F (36.6 C) (Oral)   Resp 17   Ht $R'5\' 6"'YF$  (1.676 m)   Wt 36.4 kg   SpO2 98%   BMI 12.95 kg/m   VENTILATOR SETTINGS: FiO2 (%):  [30 %] 30 %  INTAKE / OUTPUT: I/O last 3 completed shifts: In: 2035 [I.V.:2035] Out: 100 [Urine:100]  PHYSICAL EXAMINATION:  General: Frail 22 year old Duchenne's muscular dystrophy HEENT: Oropharynx is unremarkable Neuro: Able to speak CV: Heart sounds are regular PULM: Decreased air movement. GI: Distention, faint bowel sounds Extremities: Contracted Skin: no rashes  or lesions     LABS:  BMET Recent Labs  Lab 05/15/18 2327 05/17/18 1519  NA 138 141  K 5.6* 5.4*  CL 91* 95*  CO2 34* 36*  BUN 10 <5*  CREATININE <0.30* <0.30*  GLUCOSE 99 87    Electrolytes Recent Labs  Lab 05/15/18 2327 05/17/18 1519  CALCIUM 8.6* 8.1*  MG  --  1.6*  PHOS  --  3.6    CBC Recent Labs  Lab 05/15/18 2327 05/17/18 0500  WBC 4.8 5.2  HGB 16.6 16.5  HCT 53.7* 52.9*  PLT 146* 127*    Coag's No results for input(s): APTT, INR in the last 168 hours.  Sepsis Markers Recent Labs  Lab 05/15/18 2336 05/16/18 0921  LATICACIDVEN 1.14  --   PROCALCITON  --  <0.10    ABG Recent Labs  Lab 05/16/18 0240 05/16/18 0817 05/17/18 0235  PHART 7.185* 7.309* 7.234*  PCO2ART 113* 76.2* 95.9*  PO2ART 278* 114* 157*    Liver Enzymes Recent Labs  Lab 05/15/18 2327  AST 40  ALT 39  ALKPHOS 46  BILITOT 0.9  ALBUMIN 3.8    Cardiac Enzymes Recent Labs  Lab 05/15/18 2332  TROPONINI 0.03*    Glucose Recent Labs  Lab 05/16/18 1554 05/16/18 2311 05/17/18 0011 05/17/18 0551 05/17/18 0749 05/18/18 0635  GLUCAP 82 64* 124* 84 82 86    Imaging Dg Abd 2 Views  Result Date: 05/18/2018 CLINICAL DATA:  Ileus, MS EXAM: ABDOMEN - 2 VIEW  COMPARISON:  CT abdomen pelvis dated 05/16/2018 FINDINGS: Mild gastric distention. No evidence of bowel obstruction. No significant colonic distention to suggest in a colonic ileus. No evidence of free air on the lateral decubitus view. IMPRESSION: No evidence of small bowel obstruction or free air. Mild gastric distention. Electronically Signed   By: Julian Hy M.D.   On: 05/18/2018 10:12     DISCUSSION:  He likely has baseline hypercapnia with acute worsening Due to  recurrent colonic ileus.  but does not tolerate NIV well.  Avoid all narcotics which will exacerbate his normal hypercarbic state.  ASSESSMENT / PLAN:  Acute on chronic hypoxic/hypercapnic respiratory failure. Does not tolerate  BiPAP Hospital service as ordered CPAP Most likely he needs either palliation or the next logical step would be a tracheostomy.     Abdominal pain with recurrent colonic ileus. Slowly advance diet as tolerated Intermittent radiological updates   Hypotension likely from hypovolemia. Pericardial effusion. IV fluids as needed Avoid sedatives and narcotics  Hyperkalemia. - likely from acidosis - await BMET  DVT prophylaxis - SQ heparin SUP - not indicated Nutrition - NPO Goals of care - full code  05/18/2018 father updated bedside Palliative care consult has been initiated Patient is refusing BiPAP CPAP has been ordered by the hospitalist service. Pulmonary critical care is having very little input currently therefore will be available as needed  Richardson Landry Shakala Marlatt ACNP Maryanna Shape PCCM Pager [phone] till 1 pm If no answer page [phone] 05/18/2018, 12:00 PM

## 2018-05-18 NOTE — Plan of Care (Signed)
Educated patient and family present at bedside of the importance of wearing Bipap. Pt. Still refusing at this time. Will try again after pt. Eats breakfast.

## 2018-05-18 NOTE — Progress Notes (Signed)
Pt not on BIPAP at this time. Pt not able to do NIF effectively.

## 2018-05-18 NOTE — Progress Notes (Signed)
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 CT neg for sigmoid volvulus. Today's X-Ray KUB- resolution. Good BMs -Advance diet -Continue MiraLAX -Will sign off for now. - FU with Dr Benson Norway as outpt.      HPI:    Chief Complaint:   Peter Hayes is a 22 y.o. male  Much better X-ray KUB was resolution of pseudoobstruction. Bowel movements - good. Tolerating by mouth well. Family pleased with the progress.  Past Medical History:  Diagnosis Date  . MD (muscular dystrophy) ([place])   . Refusal of blood product    patient is Peter Hayes witness    Current Facility-Administered Medications  Medication Dose Route Frequency Provider Last Rate Last Dose  . albuterol (PROVENTIL) (2.5 MG/3ML) 0.083% nebulizer solution 2.5 mg  2.5 mg Nebulization TID Etta Quill, DO   2.5 mg at 05/18/18 0806  . bisacodyl (DULCOLAX) suppository 10 mg  10 mg Rectal Daily Rigoberto Noel, MD   10 mg at 05/18/18 1057  . heparin injection 5,000 Units  5,000 Units Subcutaneous Q8H Hammonds, Sharyn Blitz, MD   5,000 Units at 05/18/18 0630  . lactated ringers infusion   Intravenous Continuous Patrecia Pour, Christean Grief, MD      . mineral oil enema 1 enema  1 enema Rectal Daily PRN Hammonds, Sharyn Blitz, MD      . ondansetron [facility]) injection 4 mg  4 mg Intravenous Q6H PRN Hammonds, Sharyn Blitz, MD      . polyethylene glycol (MIRALAX / GLYCOLAX) packet 17 g  17 g Oral Daily Carol Ada, MD   17 g at 05/18/18 1057  . sodium chloride 0.9 % bolus 1,000 mL  1,000 mL Intravenous Once Patrecia Pour, Christean Grief, MD 984 mL/hr at 05/18/18 1208 1,000 mL at 05/18/18 1208    Past Surgical History:  Procedure Laterality Date  . EYE SURGERY    . FLEXIBLE SIGMOIDOSCOPY N/A 01/10/2018   Procedure: FLEXIBLE SIGMOIDOSCOPY;  Surgeon: Laurence Spates, MD;  Location: [facility];   Service: Endoscopy;  Laterality: N/A;    No family history on file.  Social History   Tobacco Use  . Smoking status: Never Smoker  . Smokeless tobacco: Never Used  Substance Use Topics  . Alcohol use: No  . Drug use: No    Allergies  Allergen Reactions  . Fentanyl Other (See Comments)    Dizziness     Review of Systems: All systems reviewed and negative except where noted in HPI.    Physical Exam:     BP (!) 101/43   Pulse 96   Temp 97.9 F (36.6 C) (Oral)   Resp 17   Ht $R'5\' 6"'Wx$  (1.676 m)   Wt 36.4 kg   SpO2 98%   BMI 12.95 kg/m  $Rem'@WEIGHTLAST3'dHau$ @  ABDOMEN: Inspection: No visible peristalsis, no abnormal pulsations, skin normal.  Palpation/percussion: Soft, nontender, nondistended, no rigidity, no abnormal dullness to percussion, no hepatosplenomegaly and no palpable abdominal masses.  Auscultation: Normal bowel sounds, no abdominal bruits.   Data Reviewed: I have personally reviewed following labs and imaging studies  CBC: Recent Labs  Lab 05/15/18 2327 05/17/18 0500  WBC 4.8 5.2  NEUTROABS 2.8  --   HGB 16.6 16.5  HCT 53.7* 52.9*  MCV 93.1 91.2  PLT 146* 347*   Basic Metabolic Panel: Recent Labs  Lab 05/15/18 2327 05/17/18 1519  NA  138 141  K 5.6* 5.4*  CL 91* 95*  CO2 34* 36*  GLUCOSE 99 87  BUN 10 <5*  CREATININE <0.30* <0.30*  CALCIUM 8.6* 8.1*  MG  --  1.6*  PHOS  --  3.6   GFR: CrCl cannot be calculated (This lab value cannot be used to calculate CrCl because it is not a number: <0.30). Liver Function Tests: Recent Labs  Lab 05/15/18 2327  AST 40  ALT 39  ALKPHOS 46  BILITOT 0.9  PROT 6.5  ALBUMIN 3.8   Recent Labs  Lab 05/15/18 2327  LIPASE 24   No results for input(s): AMMONIA in the last 168 hours. Coagulation Profile: No results for input(s): INR, PROTIME in the last 168 hours. HbA1C: No results for input(s): HGBA1C in the last 72 hours. Lipid Profile: No results for input(s): CHOL, HDL, LDLCALC, TRIG, CHOLHDL,  LDLDIRECT in the last 72 hours. Thyroid Function Tests: No results for input(s): TSH, T4TOTAL, FREET4, T3FREE, THYROIDAB in the last 72 hours. Anemia Panel: No results for input(s): VITAMINB12, FOLATE, FERRITIN, TIBC, IRON, RETICCTPCT in the last 72 hours.  Recent Results (from the past 240 hour(s))  Blood culture (routine x 2)     Status: None (Preliminary result)   Collection Time: 05/15/18 11:25 PM  Result Value Ref Range Status   Specimen Description   Final    BLOOD LEFT FOREARM Performed at [facility], [address] [place], [place] [address]    Special Requests   Final    BOTTLES DRAWN AEROBIC AND ANAEROBIC Blood Culture adequate volume Performed at [facility], [address] [place], [place] [address]    Culture   Final    NO GROWTH 1 DAY Performed at [facility], [address] [place] [address], [place], [place] [address]    Report Status PENDING  Incomplete  Urine Culture     Status: None   Collection Time: 05/16/18  3:03 AM  Result Value Ref Range Status   Specimen Description   Final    URINE, CLEAN CATCH Performed at [facility], [address], [place], [place] [address]    Special Requests   Final    NONE Performed at [facility], [address], [place], [place] [address]    Culture   Final    NO GROWTH Performed at [facility], [address], [place], [place] [address]    Report Status 05/17/2018 FINAL  Final  MRSA PCR Screening     Status: None   Collection Time: 05/16/18 10:19 AM  Result Value Ref Range Status   MRSA by PCR NEGATIVE NEGATIVE Final    Comment:        The GeneXpert MRSA Assay (FDA approved for NASAL specimens only), is one component of a comprehensive MRSA colonization surveillance program. It is not intended to diagnose MRSA infection nor to guide or monitor treatment for MRSA infections. Performed at [facility], [address]  [place], [place] [address]       Radiology Studies: Ct Angio Chest Pe W And/or Wo Contrast  Result Date: 05/16/2018 CLINICAL DATA:  Hypoxia with low oxygen saturation and abdominal pain. White blood cell count 4.8. History of muscular dystrophy. EXAM: CT ANGIOGRAPHY CHEST CT ABDOMEN AND PELVIS WITH CONTRAST TECHNIQUE: Multidetector CT imaging of the chest was performed using the standard protocol during bolus administration of intravenous contrast. Multiplanar CT image reconstructions and MIPs were obtained to evaluate  the vascular anatomy. Multidetector CT imaging of the abdomen and pelvis was performed using the standard protocol during bolus administration of intravenous contrast. CONTRAST:  57mL ISOVUE-370 IOPAMIDOL (ISOVUE-370) INJECTION 76% COMPARISON:  CT AP 01/10/2018 FINDINGS: CTA CHEST FINDINGS Cardiovascular: Satisfactory opacification of the thoracic aorta and pulmonary arteries to the segmental level. No pulmonary embolus. No aortic aneurysm or dissection. Heart size is mildly enlarged with pericardial effusion measuring up to 1.4 cm in thickness over the left ventricular apex. Conventional branch pattern of the great vessels without stenosis or dissection. Mediastinum/Nodes: No enlarged mediastinal, hilar, or axillary lymph nodes. Thyroid gland, trachea, and esophagus demonstrate no significant findings. Lungs/Pleura: Small bilateral pleural effusions with adjacent compressive atelectasis. Subpleural faint opacities in the lingula suspicious for pneumonia and/or atelectasis, series 6/63 for example. No dominant pulmonary mass. No pneumothorax. Musculoskeletal: No chest wall abnormality. No acute or significant osseous findings. Review of the MIP images confirms the above findings. CT ABDOMEN and PELVIS FINDINGS Hepatobiliary: No focal liver abnormality is seen. No gallstones, gallbladder wall thickening, or biliary dilatation. Pancreas: Unremarkable. No pancreatic ductal dilatation or surrounding  inflammatory changes. Spleen: Normal in size without focal abnormality. Adrenals/Urinary Tract: Adrenal glands are unremarkable. Kidneys are normal, without renal calculi, focal lesion, or hydronephrosis. Mild circumferential mural thickening of the bladder which may reflect stigmata of cystitis. Stomach/Bowel: Fluid-filled distention of the stomach with normal small bowel rotation. No small bowel dilatation or obstruction. A large amount of gas is seen throughout much of the colon with stool in the right colon. No mural thickening is noted. More liquid stool is seen in the recto sigmoid. Appendix is not confidently identified. Vascular/Lymphatic: Nonaneurysmal abdominal aorta. No lymphadenopathy. Reproductive: Normal size prostate. Other: No free air nor free fluid. Musculoskeletal: Diffuse severe muscle atrophy system with muscular dystrophy. Thoracolumbar levoscoliosis. Review of the MIP images confirms the above findings. IMPRESSION: CT chest: 1. Small bilateral pleural effusions with adjacent atelectasis. No active pulmonary disease. 2. No acute pulmonary embolus.  No aortic aneurysm or dissection. 3. Small pericardial effusion measuring up to 1.4 cm in thickness. CT AP: 1. No bowel obstruction. Diffuse moderate gaseous distention of the colon similar to prior may represent dysmotility. No definite mechanical bowel obstruction. 2. Diffuse muscle atrophy consistent with history of muscular dystrophy. 3. Slightly thickened appearance of the bladder wall raises the possibility of chronic cystitis. Electronically Signed   By: Ashley Royalty M.D.   On: 05/16/2018 02:08   Ct Abdomen Pelvis W Contrast  Result Date: 05/16/2018 CLINICAL DATA:  Hypoxia with low oxygen saturation and abdominal pain. White blood cell count 4.8. History of muscular dystrophy. EXAM: CT ANGIOGRAPHY CHEST CT ABDOMEN AND PELVIS WITH CONTRAST TECHNIQUE: Multidetector CT imaging of the chest was performed using the standard protocol during bolus  administration of intravenous contrast. Multiplanar CT image reconstructions and MIPs were obtained to evaluate the vascular anatomy. Multidetector CT imaging of the abdomen and pelvis was performed using the standard protocol during bolus administration of intravenous contrast. CONTRAST:  27mL ISOVUE-370 IOPAMIDOL (ISOVUE-370) INJECTION 76% COMPARISON:  CT AP 01/10/2018 FINDINGS: CTA CHEST FINDINGS Cardiovascular: Satisfactory opacification of the thoracic aorta and pulmonary arteries to the segmental level. No pulmonary embolus. No aortic aneurysm or dissection. Heart size is mildly enlarged with pericardial effusion measuring up to 1.4 cm in thickness over the left ventricular apex. Conventional branch pattern of the great vessels without stenosis or dissection. Mediastinum/Nodes: No enlarged mediastinal, hilar, or axillary lymph nodes. Thyroid gland, trachea, and esophagus demonstrate no significant findings.  Lungs/Pleura: Small bilateral pleural effusions with adjacent compressive atelectasis. Subpleural faint opacities in the lingula suspicious for pneumonia and/or atelectasis, series 6/63 for example. No dominant pulmonary mass. No pneumothorax. Musculoskeletal: No chest wall abnormality. No acute or significant osseous findings. Review of the MIP images confirms the above findings. CT ABDOMEN and PELVIS FINDINGS Hepatobiliary: No focal liver abnormality is seen. No gallstones, gallbladder wall thickening, or biliary dilatation. Pancreas: Unremarkable. No pancreatic ductal dilatation or surrounding inflammatory changes. Spleen: Normal in size without focal abnormality. Adrenals/Urinary Tract: Adrenal glands are unremarkable. Kidneys are normal, without renal calculi, focal lesion, or hydronephrosis. Mild circumferential mural thickening of the bladder which may reflect stigmata of cystitis. Stomach/Bowel: Fluid-filled distention of the stomach with normal small bowel rotation. No small bowel dilatation or  obstruction. A large amount of gas is seen throughout much of the colon with stool in the right colon. No mural thickening is noted. More liquid stool is seen in the recto sigmoid. Appendix is not confidently identified. Vascular/Lymphatic: Nonaneurysmal abdominal aorta. No lymphadenopathy. Reproductive: Normal size prostate. Other: No free air nor free fluid. Musculoskeletal: Diffuse severe muscle atrophy system with muscular dystrophy. Thoracolumbar levoscoliosis. Review of the MIP images confirms the above findings. IMPRESSION: CT chest: 1. Small bilateral pleural effusions with adjacent atelectasis. No active pulmonary disease. 2. No acute pulmonary embolus.  No aortic aneurysm or dissection. 3. Small pericardial effusion measuring up to 1.4 cm in thickness. CT AP: 1. No bowel obstruction. Diffuse moderate gaseous distention of the colon similar to prior may represent dysmotility. No definite mechanical bowel obstruction. 2. Diffuse muscle atrophy consistent with history of muscular dystrophy. 3. Slightly thickened appearance of the bladder wall raises the possibility of chronic cystitis. Electronically Signed   By: Ashley Royalty M.D.   On: 05/16/2018 02:08   Dg Chest Portable 1 View  Result Date: 05/16/2018 CLINICAL DATA:  Acute onset of generalized abdominal pain and chest pain. EXAM: PORTABLE CHEST 1 VIEW COMPARISON:  Chest radiograph performed 12/05/2017 FINDINGS: The lungs are mildly hypoexpanded. Apparent vascular congestion is noted. No pleural effusion or pneumothorax is seen. There is no evidence of focal opacification, pleural effusion or pneumothorax. The cardiomediastinal silhouette is within normal limits. No acute osseous abnormalities are seen. IMPRESSION: Apparent vascular congestion noted. Lungs mildly hypoexpanded but grossly clear. Electronically Signed   By: Garald Balding M.D.   On: 05/16/2018 00:15   Dg Abd 2 Views  Result Date: 05/18/2018 CLINICAL DATA:  Ileus, MS EXAM: ABDOMEN - 2  VIEW COMPARISON:  CT abdomen pelvis dated 05/16/2018 FINDINGS: Mild gastric distention. No evidence of bowel obstruction. No significant colonic distention to suggest in a colonic ileus. No evidence of free air on the lateral decubitus view. IMPRESSION: No evidence of small bowel obstruction or free air. Mild gastric distention. Electronically Signed   By: Julian Hy M.D.   On: 05/18/2018 10:12   Dg Abd Portable 2 Views  Result Date: 05/16/2018 CLINICAL DATA:  Acute onset of generalized abdominal pain. EXAM: PORTABLE ABDOMEN - 2 VIEW COMPARISON:  Abdominal radiograph performed 01/13/2018 FINDINGS: There is diffuse distention of the colon with air, possibly reflecting mild ileus; a large amount of stool at the pelvis could reflect fecal impaction. No abnormal dilatation of small bowel loops is seen to suggest small bowel obstruction. No free intra-abdominal air is identified on the provided decubitus view, though evaluation is somewhat suboptimal due to the degree of penetration. The visualized osseous structures are within normal limits; the sacroiliac joints are unremarkable in  appearance. The visualized lung bases are essentially clear. IMPRESSION: Diffuse distention of the colon with air, possibly reflecting mild ileus; a large amount of stool at the pelvis could reflect fecal impaction. No abnormal dilatation of small bowel loops seen to suggest small bowel obstruction. No definite free intra-abdominal air seen. Electronically Signed   By: Garald Balding M.D.   On: 05/16/2018 00:16      Candance Bohlman,MD 05/18/2018, 12:50 PM   CC No ref. provider found

## 2018-05-18 NOTE — Progress Notes (Signed)
PROGRESS NOTE [facility]   Carlo Guevarra   XQJ:[id number] DOB: 1996-08-23  DOA: 05/15/2018 PCP: Penni Bombard, PA   Brief Narrative:  Peter Hayes 22 year old male with medical history of muscular dystrophy, sigmoid volvulus (01/2018) with recurrent ileus and constipation presented to the emergency department complaining of abdominal pain associated with nausea and vomiting.  Upon ED evaluation he was noted to be hypoxic 75 to 78% on room air with no symptoms of shortness of breath, cough or chest pain.  ABG was performed and revealed severe hypercapnia.  Patient was placed on BiPAP.  CT of the chest revealed no PE, only small bilateral pleural effusion.  PCCM was consulted and patient was admitted to stepdown unit.  CT of the abdomen shows diffuse gaseous distention with no SBO.  Subjective: Patient seen and examined, family at bedside abdominal pain has improved.  Patient is tolerating diet well.  He is passing gas and had small bowel movement this morning.  Patient did not tolerate BiPAP overnight.  He is on 2 L nasal cannula and saturations are 100%.  Assessment & Plan: Acute hypoxic and hypercapnic respiratory failure Felt to be related to respiratory muscle weakness from muscular dystrophy, as well as abdominal distention from ileus.  Patient was placed on BiPAP, however did not tolerate this.  Patient now saturating well in 2 L nasal cannula.  He is clinically improving.  Will give trial of CPAP at night.  Wean O2 as able.  Become lethargic and tachypneic repeat ABG.  Palliative care has been consulted for goal of care.  Prerenal azotemia with mild hyperkalemia Will continue gentle hydration with LR Avoid nephrotoxic agent and hypotension Continue to monitor renal function.  Abdominal pain  Felt to be related to ileus versus Ogilvie syndrome GI recommendations appreciated, does not feel that he has sigmoid volvulus at this time. Recommended MiraLAX, Dulcolax and avoid narcotic  medication.  Will advance diet as tolerated Abdominal x-ray shows mild gas distention with no SBO.  Hypotension Blood pressure improving, echo with small pericardial effusion but no tamponade. Likely due to hypovolemia.  Will give bolus and continue gentle hydration.   Muscular dystrophy Palliative care consulted to help discuss goals of care  DVT prophylaxis: Heparin sq Code Status: Full Code  Family Communication: Family at bedside  Disposition Plan: Home in 1-2 days   Consultants:   GI  PCCM   Procedures:   None   Antimicrobials:  None   Objective: Vitals:   05/18/18 0600 05/18/18 0700 05/18/18 0800 05/18/18 0900  BP: (!) 99/44 (!) 115/54 98/60 (!) 101/43  Pulse:      Resp: 17     Temp:   97.8 F (36.6 C)   TempSrc:   Oral   SpO2: 98%     Weight: 36.4 kg     Height:        Intake/Output Summary (Last 24 hours) at 05/18/2018 1128 Last data filed at 05/18/2018 0900 Gross per 24 hour  Intake 1195.93 ml  Output 125 ml  Net 1070.93 ml   Filed Weights   05/15/18 2249 05/16/18 1000 05/18/18 0600  Weight: 43.1 kg 37 kg 36.4 kg    Examination:  General exam: Appears calm and comfortable  HEENT: OP clear  Respiratory system: CTA no wheezing  Cardiovascular system: S1 & S2 heard, RRR. No JVD, murmurs, rubs or gallops Gastrointestinal system: Abdomen is nondistended, soft and nontender. Central nervous system: Alert and oriented. No focal neurological deficits. Extremities: No pedal edema.  Skin: No rashes, lesions or ulcers Psychiatry: Mood & affect appropriate.    Data Reviewed: I have personally reviewed following labs and imaging studies  CBC: Recent Labs  Lab 05/15/18 2327 05/17/18 0500  WBC 4.8 5.2  NEUTROABS 2.8  --   HGB 16.6 16.5  HCT 53.7* 52.9*  MCV 93.1 91.2  PLT 146* 778*   Basic Metabolic Panel: Recent Labs  Lab 05/15/18 2327 05/17/18 1519  NA 138 141  K 5.6* 5.4*  CL 91* 95*  CO2 34* 36*  GLUCOSE 99 87  BUN 10 <5*    CREATININE <0.30* <0.30*  CALCIUM 8.6* 8.1*  MG  --  1.6*  PHOS  --  3.6   GFR: CrCl cannot be calculated (This lab value cannot be used to calculate CrCl because it is not a number: <0.30). Liver Function Tests: Recent Labs  Lab 05/15/18 2327  AST 40  ALT 39  ALKPHOS 46  BILITOT 0.9  PROT 6.5  ALBUMIN 3.8   Recent Labs  Lab 05/15/18 2327  LIPASE 24   No results for input(s): AMMONIA in the last 168 hours. Coagulation Profile: No results for input(s): INR, PROTIME in the last 168 hours. Cardiac Enzymes: Recent Labs  Lab 05/15/18 2332  TROPONINI 0.03*   BNP (last 3 results) No results for input(s): PROBNP in the last 8760 hours. HbA1C: No results for input(s): HGBA1C in the last 72 hours. CBG: Recent Labs  Lab 05/16/18 2311 05/17/18 0011 05/17/18 0551 05/17/18 0749 05/18/18 0635  GLUCAP 64* 124* 84 82 86   Lipid Profile: No results for input(s): CHOL, HDL, LDLCALC, TRIG, CHOLHDL, LDLDIRECT in the last 72 hours. Thyroid Function Tests: No results for input(s): TSH, T4TOTAL, FREET4, T3FREE, THYROIDAB in the last 72 hours. Anemia Panel: No results for input(s): VITAMINB12, FOLATE, FERRITIN, TIBC, IRON, RETICCTPCT in the last 72 hours. Sepsis Labs: Recent Labs  Lab 05/15/18 2336 05/16/18 0921  PROCALCITON  --  <0.10  LATICACIDVEN 1.14  --     Recent Results (from the past 240 hour(s))  Blood culture (routine x 2)     Status: None (Preliminary result)   Collection Time: 05/15/18 11:25 PM  Result Value Ref Range Status   Specimen Description   Final    BLOOD LEFT FOREARM Performed at [facility], [place] [address] [place], [place] [address]    Special Requests   Final    BOTTLES DRAWN AEROBIC AND ANAEROBIC Blood Culture adequate volume Performed at [place] [address] [place], [place] [address]    Culture   Final    NO GROWTH 1 DAY Performed at [facility], [place] [address] [place], [place] [address]     Report Status PENDING  Incomplete  Urine Culture     Status: None   Collection Time: 05/16/18  3:03 AM  Result Value Ref Range Status   Specimen Description   Final    URINE, CLEAN CATCH Performed at [facility], [place] [address], [place], [place] [address]    Special Requests   Final    NONE Performed at [facility], [place] [address] [place], [place] [address]    Culture   Final    NO GROWTH Performed at [facility], [place] [address], [place], [place] [address]    Report Status 05/17/2018 FINAL  Final  MRSA PCR Screening     Status: None   Collection Time: 05/16/18 10:19 AM  Result  Value Ref Range Status   MRSA by PCR NEGATIVE NEGATIVE Final    Comment:        The GeneXpert MRSA Assay (FDA approved for NASAL specimens only), is one component of a comprehensive MRSA colonization surveillance program. It is not intended to diagnose MRSA infection nor to guide or monitor treatment for MRSA infections. Performed at [facility], [place] [address], [place], [place] [address]      Radiology Studies: Dg Abd 2 Views  Result Date: 05/18/2018 CLINICAL DATA:  Ileus, MS EXAM: ABDOMEN - 2 VIEW COMPARISON:  CT abdomen pelvis dated 05/16/2018 FINDINGS: Mild gastric distention. No evidence of bowel obstruction. No significant colonic distention to suggest in a colonic ileus. No evidence of free air on the lateral decubitus view. IMPRESSION: No evidence of small bowel obstruction or free air. Mild gastric distention. Electronically Signed   By: Julian Hy M.D.   On: 05/18/2018 10:12    Scheduled Meds: . albuterol  2.5 mg Nebulization TID  . bisacodyl  10 mg Rectal Daily  . heparin  5,000 Units Subcutaneous Q8H  . polyethylene glycol  17 g Oral Daily   Continuous Infusions: . dextrose 5 % and 0.45% NaCl 50 mL/hr at 05/18/18 0630     LOS: 2 days    Time spent: Total of 35 minutes spent with pt, greater than 50% of  which was spent in discussion of  treatment, counseling and coordination of care   Chipper Oman, MD Pager: Text Page via [email or web address]   If 7PM-7AM, please contact night-coverage [email or web address] 05/18/2018, 11:28 AM   Note - This record has been created using [employer]. Chart creation errors have been sought, but may not always have been located. Such creation errors do not reflect on the standard of medical care.

## 2018-05-19 ENCOUNTER — Inpatient Hospital Stay (HOSPITAL_COMMUNITY): Payer: Managed Care, Other (non HMO)

## 2018-05-19 LAB — BASIC METABOLIC PANEL
Anion gap: 8 (ref 5–15)
CALCIUM: 8.3 mg/dL — AB (ref 8.9–10.3)
CO2: 41 mmol/L — AB (ref 22–32)
Chloride: 91 mmol/L — ABNORMAL LOW (ref 98–111)
GLUCOSE: 89 mg/dL (ref 70–99)
Potassium: 4.1 mmol/L (ref 3.5–5.1)
Sodium: 140 mmol/L (ref 135–145)

## 2018-05-19 LAB — CBC WITH DIFFERENTIAL/PLATELET
BASOS ABS: 0 10*3/uL (ref 0.0–0.1)
Basophils Relative: 0 %
EOS PCT: 1 %
Eosinophils Absolute: 0 10*3/uL (ref 0.0–0.7)
HCT: 46.9 % (ref 39.0–52.0)
Hemoglobin: 13.9 g/dL (ref 13.0–17.0)
Lymphocytes Relative: 28 %
Lymphs Abs: 1.2 10*3/uL (ref 0.7–4.0)
MCH: 28.3 pg (ref 26.0–34.0)
MCHC: 29.6 g/dL — AB (ref 30.0–36.0)
MCV: 95.3 fL (ref 78.0–100.0)
MONO ABS: 0.9 10*3/uL (ref 0.1–1.0)
MONOS PCT: 22 %
Neutro Abs: 2.1 10*3/uL (ref 1.7–7.7)
Neutrophils Relative %: 49 %
PLATELETS: 111 10*3/uL — AB (ref 150–400)
RBC: 4.92 MIL/uL (ref 4.22–5.81)
RDW: 14.6 % (ref 11.5–15.5)
WBC: 4.2 10*3/uL (ref 4.0–10.5)

## 2018-05-19 LAB — GLUCOSE, CAPILLARY
GLUCOSE-CAPILLARY: 73 mg/dL (ref 70–99)
GLUCOSE-CAPILLARY: 80 mg/dL (ref 70–99)
GLUCOSE-CAPILLARY: 96 mg/dL (ref 70–99)
Glucose-Capillary: 110 mg/dL — ABNORMAL HIGH (ref 70–99)

## 2018-05-19 MED ORDER — SODIUM CHLORIDE 0.9 % IV BOLUS
1000.0000 mL | Freq: Once | INTRAVENOUS | Status: AC
  Administered 2018-05-19: 1000 mL via INTRAVENOUS

## 2018-05-19 NOTE — Progress Notes (Signed)
Pt transferred to floor with family and ICU staff present. Pt denies pain at this time with no s/s of distress noted. Pts assessment is unchanged from earlier documentation. Will continue to monitor

## 2018-05-19 NOTE — Progress Notes (Signed)
Called report to [place].

## 2018-05-19 NOTE — Care Management Note (Addendum)
Case Management Note  Patient Details  Name: Donelle Baba MRN: [id number] Date of Birth: 08-05-1996  Subjective/Objective:      resolutuion of bowel obstruction, hx of ms/pcc pending/ gi and pccm have signed off/o2 [place] at 2l/min/iv lr at 100cc/hr/            Patient to go home with c-pap due to ms, and respo failure/   Action/Plan:  paliative care consult pending/ will follow for cm needs. [facility] notified of need and confirmation rec'd.  Expected Discharge Date:  (unknown)               Expected Discharge Plan:  Home/Self Care  In-House Referral:     Discharge planning Services  CM Consult  Post Acute Care Choice:    Choice offered to:     DME Arranged:    DME Agency:     HH Arranged:    HH Agency:     Status of Service:  In process, will continue to follow  If discussed at Long Length of Stay Meetings, dates discussed:    Additional Comments:  Leeroy Cha, RN 05/19/2018, 8:56 AM

## 2018-05-19 NOTE — Progress Notes (Signed)
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  Upon ED evaluation he was noted to be hypoxic 75 to 78% on room air with no symptoms of shortness of breath, cough or chest pain.  ABG was performed and revealed severe hypercapnia.  Patient was placed on BiPAP.  CT of the chest revealed no PE, only small bilateral pleural effusion.  PCCM was consulted and patient was admitted to stepdown unit.  CT of the abdomen shows diffuse gaseous distention with no SBO.  Subjective: Patient seen and examined, he is having breakfast. Was of oxygen this AM, however sats were 88-89 on RA. Lace back 0.5 L and saturation increased to 97%. Having BM and passing gas, denies abdominal pain.   Assessment & Plan: Acute hypoxic and hypercapnic respiratory failure Felt to be related to respiratory muscle weakness from muscular dystrophy, as well as abdominal distention from ileus.  Patient was placed on BiPAP, however did not tolerate this. He has improved clinically improving.  Tolerated CPAP well.  Continue to wean oxygen as able. CPAP for home has been ordered. If this cant be done, may need low O2 for sleeping. Palliative care has been consulted for goal of care.  Prerenal azotemia with mild hyperkalemia Resolved, d/c IVF  Avoid nephrotoxic agent and hypotension Continue to monitor renal function.  Abdominal pain - Resolved  Felt to be related to ileus versus Ogilvie syndrome GI recommendations appreciated, does not feel that he has sigmoid volvulus at this time. Recommended MiraLAX, Dulcolax and avoid narcotic medication.  Will advance diet as tolerated Abdominal x-ray shows mild  gas distention with no SBO.  Hypotension Blood pressure improving, echo with small pericardial effusion but no tamponade. Likely due to hypovolemia.  Will give bolus and continue gentle hydration.   Muscular dystrophy Palliative care consulted to help discuss goals of care  DVT prophylaxis: Heparin sq Code Status: Full Code  Family Communication: Family at bedside  Disposition Plan: Home in 1-2 days   Consultants:   GI  PCCM   Procedures:   None   Antimicrobials:  None   Objective: Vitals:   05/19/18 0800 05/19/18 0900 05/19/18 1030 05/19/18 1409  BP: (!) 101/55 (!) 99/58 99/66 109/68  Pulse:   (!) 105 (!) 112  Resp: $Remo'14 19 17 20  'bftMX$ Temp:    98.9 F (37.2 C)  TempSrc:    Oral  SpO2: 100% 97% 90% (!) 86%  Weight:      Height:        Intake/Output Summary (Last 24 hours) at 05/19/2018 1425 Last data filed at 05/19/2018 0954 Gross per 24 hour  Intake 2840.6 ml  Output 300 ml  Net 2540.6 ml   Filed Weights   05/15/18 2249 05/16/18 1000 05/18/18 0600  Weight: 43.1 kg 37 kg 36.4 kg    Examination:  General: NAD  Cardiovascular: RRR, S1/S2 +, no rubs, no gallops Respiratory: CTA bilaterally, no wheezing, no rhonchi Abdominal: Soft, NTND  Extremities: Contracted, No LE edema   Data Reviewed: I have personally reviewed following labs and imaging studies  CBC: Recent Labs  Lab 05/15/18 2327 05/17/18 0500 05/19/18 0355  WBC 4.8 5.2 4.2  NEUTROABS 2.8  --  2.1  HGB  16.6 16.5 13.9  HCT 53.7* 52.9* 46.9  MCV 93.1 91.2 95.3  PLT 146* 127* 604*   Basic Metabolic Panel: Recent Labs  Lab 05/15/18 2327 05/17/18 1519 05/19/18 0355  NA 138 141 140  K 5.6* 5.4* 4.1  CL 91* 95* 91*  CO2 34* 36* 41*  GLUCOSE 99 87 89  BUN 10 <5* <5*  CREATININE <0.30* <0.30* <0.30*  CALCIUM 8.6* 8.1* 8.3*  MG  --  1.6*  --   PHOS  --  3.6  --    GFR: CrCl cannot be calculated (This lab value cannot be used to calculate CrCl because it is not a number: <0.30). Liver  Function Tests: Recent Labs  Lab 05/15/18 2327  AST 40  ALT 39  ALKPHOS 46  BILITOT 0.9  PROT 6.5  ALBUMIN 3.8   Recent Labs  Lab 05/15/18 2327  LIPASE 24   No results for input(s): AMMONIA in the last 168 hours. Coagulation Profile: No results for input(s): INR, PROTIME in the last 168 hours. Cardiac Enzymes: Recent Labs  Lab 05/15/18 2332  TROPONINI 0.03*   BNP (last 3 results) No results for input(s): PROBNP in the last 8760 hours. HbA1C: No results for input(s): HGBA1C in the last 72 hours. CBG: Recent Labs  Lab 05/18/18 1159 05/18/18 1801 05/18/18 2322 05/19/18 0528 05/19/18 1208  GLUCAP 124* 141* 110* 73 96   Lipid Profile: No results for input(s): CHOL, HDL, LDLCALC, TRIG, CHOLHDL, LDLDIRECT in the last 72 hours. Thyroid Function Tests: No results for input(s): TSH, T4TOTAL, FREET4, T3FREE, THYROIDAB in the last 72 hours. Anemia Panel: No results for input(s): VITAMINB12, FOLATE, FERRITIN, TIBC, IRON, RETICCTPCT in the last 72 hours. Sepsis Labs: Recent Labs  Lab 05/15/18 2336 05/16/18 0921  PROCALCITON  --  <0.10  LATICACIDVEN 1.14  --     Recent Results (from the past 240 hour(s))  Blood culture (routine x 2)     Status: None (Preliminary result)   Collection Time: 05/15/18 11:25 PM  Result Value Ref Range Status   Specimen Description   Final    BLOOD LEFT FOREARM Performed at [facility], [place] [address], [place], [place] [address]    Special Requests   Final    BOTTLES DRAWN AEROBIC AND ANAEROBIC Blood Culture adequate volume Performed at [place] [address], [place], [place] [address]    Culture   Final    NO GROWTH 3 DAYS Performed at [facility], [place] [address], [place], [place] [address]    Report Status PENDING  Incomplete  Urine Culture     Status: None   Collection Time: 05/16/18  3:03 AM  Result Value Ref Range Status   Specimen Description   Final    URINE, CLEAN CATCH Performed  at [facility], [place] [address], [place], [place] [address]    Special Requests   Final    NONE Performed at [facility], [place] [address], [place], [place] [address]    Culture   Final    NO GROWTH Performed at [facility], [place] [address], [place], [place] [address]    Report Status 05/17/2018 FINAL  Final  MRSA PCR Screening     Status: None   Collection Time: 05/16/18 10:19 AM  Result Value Ref Range Status   MRSA by PCR NEGATIVE NEGATIVE Final    Comment:        The GeneXpert MRSA Assay (FDA  approved for NASAL specimens only), is one component of a comprehensive MRSA colonization surveillance program. It is not intended to diagnose MRSA infection nor to guide or monitor treatment for MRSA infections. Performed at [facility], [place] [address], [place],  [address]      Radiology Studies: Dg Chest Port 1 View  Result Date: 05/19/2018 CLINICAL DATA:  Congestion, shortness of breath and hypoxia. EXAM: PORTABLE CHEST 1 VIEW COMPARISON:  May 15, 2018 FINDINGS: The heart size and mediastinal contours are stable. There is mild pulmonary edema. There is probable minimal right pleural effusion. No focal pneumonia is noted. The visualized skeletal structures are stable. IMPRESSION: Mild pulmonary edema with probable minimal right pleural effusion. No focal pneumonia identified. Electronically Signed   By: Abelardo Diesel M.D.   On: 05/19/2018 09:22   Dg Abd 2 Views  Result Date: 05/18/2018 CLINICAL DATA:  Ileus, MS EXAM: ABDOMEN - 2 VIEW COMPARISON:  CT abdomen pelvis dated 05/16/2018 FINDINGS: Mild gastric distention. No evidence of bowel obstruction. No significant colonic distention to suggest in a colonic ileus. No evidence of free air on the lateral decubitus view. IMPRESSION: No evidence of small bowel obstruction or free air. Mild gastric distention. Electronically Signed   By: Julian Hy M.D.   On:  05/18/2018 10:12    Scheduled Meds: . albuterol  2.5 mg Nebulization BID  . heparin  5,000 Units Subcutaneous Q8H  . polyethylene glycol  17 g Oral Daily   Continuous Infusions: . lactated ringers 50 mL/hr at 05/19/18 1000     LOS: 3 days    Time spent: Total of 25 minutes spent with pt, greater than 50% of which was spent in discussion of  treatment, counseling and coordination of care   Chipper Oman, MD Pager: Text Page via [email or web address]   If 7PM-7AM, please contact night-coverage [email or web address] 05/19/2018, 2:25 PM   Note - This record has been created using [employer]. Chart creation errors have been sought, but may not always have been located. Such creation errors do not reflect on the standard of medical care.

## 2018-05-20 LAB — BASIC METABOLIC PANEL
Anion gap: 12 (ref 5–15)
BUN: 6 mg/dL (ref 6–20)
CHLORIDE: 93 mmol/L — AB (ref 98–111)
CO2: 38 mmol/L — ABNORMAL HIGH (ref 22–32)
Calcium: 8.5 mg/dL — ABNORMAL LOW (ref 8.9–10.3)
Creatinine, Ser: <0.3 mg/dL — ABNORMAL LOW (ref 0.61–1.24)
Glucose, Bld: 81 mg/dL (ref 70–99)
POTASSIUM: 6.1 mmol/L — AB (ref 3.5–5.1)
Sodium: 143 mmol/L (ref 135–145)

## 2018-05-20 LAB — GLUCOSE, CAPILLARY
Glucose-Capillary: 81 mg/dL (ref 70–99)
Glucose-Capillary: 128 mg/dL — ABNORMAL HIGH (ref 70–99)

## 2018-05-20 LAB — POTASSIUM: POTASSIUM: 4.5 mmol/L (ref 3.5–5.1)

## 2018-05-20 NOTE — Progress Notes (Addendum)
[id number]/KVTXLE Davis,BSN,RN3,CCM/[phone]/Attempted to get c-pap for home use/per representative for [facility]-must have outpt sleep study/does not meet criteria at this time for home c/pap/ 1215/dcd to home with home o2/[facility] sent to dme at [facility]/ 1l/min [place] at night only. Request for o2 perimeters called to fllor for rn in charge of patient to get and document.

## 2018-05-20 NOTE — Progress Notes (Signed)
SATURATION QUALIFICATIONS: (This note is used to comply with regulatory documentation for home oxygen)  Patient Saturations on Room Air at Rest = 86%  Patient Saturations on Room Air while Ambulating = Patient wheelchair bound due to muscular dystrophy.  Patient Saturations on 2 Liters of oxygen while at rest = 93%  Please briefly explain why patient needs home oxygen:  Patient unable to maintain oxygen saturations on room air.

## 2018-05-20 NOTE — Discharge Summary (Signed)
Physician Discharge Summary  Peter Hayes  HUT:[id number]  DOB: 12/31/1995  DOA: 05/15/2018 PCP: Penni Bombard, PA  Admit date: 05/15/2018 Discharge date: 05/20/2018  Admitted From: Home  Disposition: Home   Recommendations for Outpatient Follow-up:  1. Follow up with PCP in 1 week  2. Recommended neurology evaluation for muscular dystrophy to discuss prognosis 3. Follow up with GI  4. Please obtain BMP/CBC in one week to monitor Hgb and renal function  5. 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  Upon ED evaluation he was noted to be hypoxic 75 to 78% on room air with no symptoms of shortness of breath, cough or chest pain.  ABG was performed and revealed severe hypercapnia.  Patient was placed on BiPAP.  CT of the chest revealed no PE, only small bilateral pleural effusion.  PCCM was consulted and patient was admitted to stepdown unit.  CT of the abdomen shows diffuse gaseous distention with no SBO. Patient was treated with BiPAP and did not tolerated well, subsequently transitioned to CPAP, which tolerated well. Abdominal pain resolved with no interventions. GI was consulted and recommended bowel regimen and encourage hydration. Patient clinically improved and was deemed stable for discharge and follow up with PCP.   Subjective: Patient seen and examined, family at bedside, patient was sleeping but woke up for my interview. Patient denies chest pain, SOB, abdominal pain, N/V and constipation. Having BM's.  No acute events overnight. Remains afebrile.   Discharge Diagnoses/Hospital Course:  Acute hypoxic and hypercapnic respiratory failure - Improved  Felt to be related to respiratory muscle weakness from muscular dystrophy, as well as abdominal distention from ileus.  Patient was placed on BiPAP, however did not tolerated. Placed on CPAP. He has improved clinically. Tolerated CPAP well. CPAP for home was ordered, however this was not approved without sleep studies. Will discharge home on O2 for sleeping while await for sleep studies. Referral to pulmonology was made.   Prerenal azotemia with mild hyperkalemia - Resolved  Resolved with hydration.  Avoid nephrotoxic agent and hypotension. Monitor BMP in 1 week  Abdominal pain - Resolved  Felt to be related to ileus versus Ogilvie syndrome GI was consulted does not feel that he has sigmoid volvulus at this time. Recommended MiraLAX, and avoid narcotic medication.   Abdominal x-ray shows mild gas distention with no SBO. Follow up with GI in 2-4 weeks   Hypotension - Resolved  Blood pressure improving, echo with small pericardial effusion but no tamponade. Felt to be related to hypovolemia.   Muscular dystrophy Discussed goals of care with family they would like to see and neurology for further information on his current condition. Agree and advised to follow up as outpatient.   On the day of the discharge the patient's vitals were stable, and no other acute medical condition were reported by patient. the patient was felt safe to be discharge to home   Discharge Instructions  You were cared for by a hospitalist during your hospital stay. If you have any questions about your discharge medications or the  care you received while you were in the hospital after you are discharged, you can call the unit and asked to speak with the hospitalist on call if the hospitalist that took care of you is not available. Once you are discharged, your primary  care physician will handle any further medical issues. Please note that NO REFILLS for any discharge medications will be authorized once you are discharged, as it is imperative that you return to your primary care physician (or establish a relationship with a primary care physician if you do not have one) for your aftercare needs so that they can reassess your need for medications and monitor your lab values.  Discharge Instructions    Ambulatory referral to Pulmonology   Complete by:  As directed    Sleep studies, need NIV   Call MD for:  difficulty breathing, headache or visual disturbances   Complete by:  As directed    Call MD for:  extreme fatigue   Complete by:  As directed    Call MD for:  hives   Complete by:  As directed    Call MD for:  persistant dizziness or light-headedness   Complete by:  As directed    Call MD for:  persistant nausea and vomiting   Complete by:  As directed    Call MD for:  redness, tenderness, or signs of infection (pain, swelling, redness, odor or green/yellow discharge around incision site)   Complete by:  As directed    Call MD for:  severe uncontrolled pain   Complete by:  As directed    Call MD for:  temperature >100.4   Complete by:  As directed    Diet - low sodium heart healthy   Complete by:  As directed    Increase activity slowly   Complete by:  As directed      Allergies as of 05/20/2018      Reactions   Fentanyl Other (See Comments)   Dizziness      Medication List    TAKE these medications   acetaminophen 325 MG tablet Commonly known as:  TYLENOL Take 2 tablets (650 mg total) by mouth every 6 (six) hours as needed for mild pain, moderate pain, fever or headache (or temp > 100).   feeding supplement (ENSURE ENLIVE) Liqd You can use whatever supplement he likes.  He needs about 1300 calories per day. 1.5 liters of fluid and 50 grams of protein per day. You can buy this at the grocery or drug store.  You do not need a  prescription.   multivitamin with minerals Tabs tablet You can get him a general multivitamin for daily use at any drug store   polyethylene glycol packet Commonly known as:  MIRALAX / GLYCOLAX Follow package instruction for daily use.  You need to have one soft bowel movement per day.  If he is not doing this call your primary care doctor.  You also need to be sure he takes in 1.5 liters of fluid per day.   He needs protein supplement daily, and needs to take in about 1300 calories per day.            Durable Medical Equipment  (From admission, onward)         Start     Ordered   05/20/18 1133  For home use only DME oxygen  Once    Question Answer Comment  Mode or (Route) Nasal cannula   Liters per Minute 1  Frequency Only at night (stationary unit needed)   Oxygen delivery system Gas      05/20/18 1133   05/19/18 1245  For home use only DME continuous positive airway pressure (CPAP)  Once    Comments:  Patient with muscular dystrophy getting hypoxia and hypercapnia while sleeping  Question Answer Comment  Patient has OSA or probable OSA No   Is the patient currently using CPAP in the home No   Settings Autotitration   CPAP supplies needed Mask, headgear, cushions, filters, heated tubing and water chamber      05/19/18 1246         Follow-up Information    Penni Bombard, PA. Schedule an appointment as soon as possible for a visit in 1 week(s).   Specialty:  Physician Assistant Why:  Hospital follow up  Contact information: [place] [address] [phone]          Allergies  Allergen Reactions  . Fentanyl Other (See Comments)    Dizziness    Consultations:  GI  PCCM    Procedures/Studies: Ct Angio Chest Pe W And/or Wo Contrast  Result Date: 05/16/2018 CLINICAL DATA:  Hypoxia with low oxygen saturation and abdominal pain. White blood cell count 4.8. History of muscular dystrophy. EXAM: CT ANGIOGRAPHY CHEST CT ABDOMEN AND  PELVIS WITH CONTRAST TECHNIQUE: Multidetector CT imaging of the chest was performed using the standard protocol during bolus administration of intravenous contrast. Multiplanar CT image reconstructions and MIPs were obtained to evaluate the vascular anatomy. Multidetector CT imaging of the abdomen and pelvis was performed using the standard protocol during bolus administration of intravenous contrast. CONTRAST:  20mL ISOVUE-370 IOPAMIDOL (ISOVUE-370) INJECTION 76% COMPARISON:  CT AP 01/10/2018 FINDINGS: CTA CHEST FINDINGS Cardiovascular: Satisfactory opacification of the thoracic aorta and pulmonary arteries to the segmental level. No pulmonary embolus. No aortic aneurysm or dissection. Heart size is mildly enlarged with pericardial effusion measuring up to 1.4 cm in thickness over the left ventricular apex. Conventional branch pattern of the great vessels without stenosis or dissection. Mediastinum/Nodes: No enlarged mediastinal, hilar, or axillary lymph nodes. Thyroid gland, trachea, and esophagus demonstrate no significant findings. Lungs/Pleura: Small bilateral pleural effusions with adjacent compressive atelectasis. Subpleural faint opacities in the lingula suspicious for pneumonia and/or atelectasis, series 6/63 for example. No dominant pulmonary mass. No pneumothorax. Musculoskeletal: No chest wall abnormality. No acute or significant osseous findings. Review of the MIP images confirms the above findings. CT ABDOMEN and PELVIS FINDINGS Hepatobiliary: No focal liver abnormality is seen. No gallstones, gallbladder wall thickening, or biliary dilatation. Pancreas: Unremarkable. No pancreatic ductal dilatation or surrounding inflammatory changes. Spleen: Normal in size without focal abnormality. Adrenals/Urinary Tract: Adrenal glands are unremarkable. Kidneys are normal, without renal calculi, focal lesion, or hydronephrosis. Mild circumferential mural thickening of the bladder which may reflect stigmata of  cystitis. Stomach/Bowel: Fluid-filled distention of the stomach with normal small bowel rotation. No small bowel dilatation or obstruction. A large amount of gas is seen throughout much of the colon with stool in the right colon. No mural thickening is noted. More liquid stool is seen in the recto sigmoid. Appendix is not confidently identified. Vascular/Lymphatic: Nonaneurysmal abdominal aorta. No lymphadenopathy. Reproductive: Normal size prostate. Other: No free air nor free fluid. Musculoskeletal: Diffuse severe muscle atrophy system with muscular dystrophy. Thoracolumbar levoscoliosis. Review of the MIP images confirms the above findings. IMPRESSION: CT chest: 1. Small bilateral pleural effusions with adjacent atelectasis. No active pulmonary disease. 2. No acute pulmonary embolus.  No aortic aneurysm or dissection. 3. Small pericardial effusion measuring up to 1.4 cm in thickness. CT AP: 1. No bowel obstruction. Diffuse moderate gaseous distention of the colon similar to prior may represent dysmotility. No definite mechanical bowel obstruction. 2. Diffuse muscle atrophy consistent with history of muscular dystrophy. 3. Slightly thickened appearance of the bladder wall raises the possibility of chronic cystitis. Electronically Signed   By: Ashley Royalty M.D.   On: 05/16/2018 02:08   Ct Abdomen Pelvis W Contrast  Result Date: 05/16/2018 CLINICAL DATA:  Hypoxia with low oxygen saturation and abdominal pain. White blood cell count 4.8. History of muscular dystrophy. EXAM: CT ANGIOGRAPHY CHEST CT ABDOMEN AND PELVIS WITH CONTRAST TECHNIQUE: Multidetector CT imaging of the chest was performed using the standard protocol during bolus administration of intravenous contrast. Multiplanar CT image reconstructions and MIPs were obtained to evaluate the vascular anatomy. Multidetector CT imaging of the abdomen and pelvis was performed using the standard protocol during bolus administration of intravenous contrast.  CONTRAST:  25mL ISOVUE-370 IOPAMIDOL (ISOVUE-370) INJECTION 76% COMPARISON:  CT AP 01/10/2018 FINDINGS: CTA CHEST FINDINGS Cardiovascular: Satisfactory opacification of the thoracic aorta and pulmonary arteries to the segmental level. No pulmonary embolus. No aortic aneurysm or dissection. Heart size is mildly enlarged with pericardial effusion measuring up to 1.4 cm in thickness over the left ventricular apex. Conventional branch pattern of the great vessels without stenosis or dissection. Mediastinum/Nodes: No enlarged mediastinal, hilar, or axillary lymph nodes. Thyroid gland, trachea, and esophagus demonstrate no significant findings. Lungs/Pleura: Small bilateral pleural effusions with adjacent compressive atelectasis. Subpleural faint opacities in the lingula suspicious for pneumonia and/or atelectasis, series 6/63 for example. No dominant pulmonary mass. No pneumothorax. Musculoskeletal: No chest wall abnormality. No acute or significant osseous findings. Review of the MIP images confirms the above findings. CT ABDOMEN and PELVIS FINDINGS Hepatobiliary: No focal liver abnormality is seen. No gallstones, gallbladder wall thickening, or biliary dilatation. Pancreas: Unremarkable. No pancreatic ductal dilatation or surrounding inflammatory changes. Spleen: Normal in size without focal abnormality. Adrenals/Urinary Tract: Adrenal glands are unremarkable. Kidneys are normal, without renal calculi, focal lesion, or hydronephrosis. Mild circumferential mural thickening of the bladder which may reflect stigmata of cystitis. Stomach/Bowel: Fluid-filled distention of the stomach with normal small bowel rotation. No small bowel dilatation or obstruction. A large amount of gas is seen throughout much of the colon with stool in the right colon. No mural thickening is noted. More liquid stool is seen in the recto sigmoid. Appendix is not confidently identified. Vascular/Lymphatic: Nonaneurysmal abdominal aorta. No  lymphadenopathy. Reproductive: Normal size prostate. Other: No free air nor free fluid. Musculoskeletal: Diffuse severe muscle atrophy system with muscular dystrophy. Thoracolumbar levoscoliosis. Review of the MIP images confirms the above findings. IMPRESSION: CT chest: 1. Small bilateral pleural effusions with adjacent atelectasis. No active pulmonary disease. 2. No acute pulmonary embolus.  No aortic aneurysm or dissection. 3. Small pericardial effusion measuring up to 1.4 cm in thickness. CT AP: 1. No bowel obstruction. Diffuse moderate gaseous distention of the colon similar to prior may represent dysmotility. No definite mechanical bowel obstruction. 2. Diffuse muscle atrophy consistent with history of muscular dystrophy. 3. Slightly thickened appearance of the bladder wall raises the possibility of chronic cystitis. Electronically Signed   By: Ashley Royalty M.D.   On: 05/16/2018 02:08   Dg Chest Port 1 View  Result Date: 05/19/2018 CLINICAL DATA:  Congestion, shortness of breath and hypoxia. EXAM: PORTABLE CHEST 1 VIEW COMPARISON:  May 15, 2018 FINDINGS: The heart  size and mediastinal contours are stable. There is mild pulmonary edema. There is probable minimal right pleural effusion. No focal pneumonia is noted. The visualized skeletal structures are stable. IMPRESSION: Mild pulmonary edema with probable minimal right pleural effusion. No focal pneumonia identified. Electronically Signed   By: Abelardo Diesel M.D.   On: 05/19/2018 09:22   Dg Chest Portable 1 View  Result Date: 05/16/2018 CLINICAL DATA:  Acute onset of generalized abdominal pain and chest pain. EXAM: PORTABLE CHEST 1 VIEW COMPARISON:  Chest radiograph performed 12/05/2017 FINDINGS: The lungs are mildly hypoexpanded. Apparent vascular congestion is noted. No pleural effusion or pneumothorax is seen. There is no evidence of focal opacification, pleural effusion or pneumothorax. The cardiomediastinal silhouette is within normal limits. No  acute osseous abnormalities are seen. IMPRESSION: Apparent vascular congestion noted. Lungs mildly hypoexpanded but grossly clear. Electronically Signed   By: Garald Balding M.D.   On: 05/16/2018 00:15   Dg Abd 2 Views  Result Date: 05/18/2018 CLINICAL DATA:  Ileus, MS EXAM: ABDOMEN - 2 VIEW COMPARISON:  CT abdomen pelvis dated 05/16/2018 FINDINGS: Mild gastric distention. No evidence of bowel obstruction. No significant colonic distention to suggest in a colonic ileus. No evidence of free air on the lateral decubitus view. IMPRESSION: No evidence of small bowel obstruction or free air. Mild gastric distention. Electronically Signed   By: Julian Hy M.D.   On: 05/18/2018 10:12   Dg Abd Portable 2 Views  Result Date: 05/16/2018 CLINICAL DATA:  Acute onset of generalized abdominal pain. EXAM: PORTABLE ABDOMEN - 2 VIEW COMPARISON:  Abdominal radiograph performed 01/13/2018 FINDINGS: There is diffuse distention of the colon with air, possibly reflecting mild ileus; a large amount of stool at the pelvis could reflect fecal impaction. No abnormal dilatation of small bowel loops is seen to suggest small bowel obstruction. No free intra-abdominal air is identified on the provided decubitus view, though evaluation is somewhat suboptimal due to the degree of penetration. The visualized osseous structures are within normal limits; the sacroiliac joints are unremarkable in appearance. The visualized lung bases are essentially clear. IMPRESSION: Diffuse distention of the colon with air, possibly reflecting mild ileus; a large amount of stool at the pelvis could reflect fecal impaction. No abnormal dilatation of small bowel loops seen to suggest small bowel obstruction. No definite free intra-abdominal air seen. Electronically Signed   By: Garald Balding M.D.   On: 05/16/2018 00:16     Discharge Exam: Vitals:   05/20/18 0542 05/20/18 1000  BP: (!) 106/56   Pulse: 97   Resp: 18   Temp: 97.7 F (36.5 C)    SpO2: 99% 97%   Vitals:   05/19/18 1935 05/19/18 2100 05/20/18 0542 05/20/18 1000  BP:  101/63 (!) 106/56   Pulse:  (!) 111 97   Resp:  18 18   Temp:  (!) 97.3 F (36.3 C) 97.7 F (36.5 C)   TempSrc:  Oral Oral   SpO2: 98% 90% 99% 97%  Weight:      Height:        General: NAD  Cardiovascular: RRR, S1/S2 + Respiratory: CTA bilaterally Abdominal: Soft, NT, ND Extremities: Contracted, poor muscle tone.    The results of significant diagnostics from this hospitalization (including imaging, microbiology, ancillary and laboratory) are listed below for reference.     Microbiology: Recent Results (from the past 240 hour(s))  Blood culture (routine x 2)     Status: None (Preliminary result)   Collection Time: 05/15/18 11:25 PM  Result Value Ref Range Status   Specimen Description   Final    BLOOD LEFT FOREARM Performed at [facility], [place] [address], [place], [place] [address]    Special Requests   Final    BOTTLES DRAWN AEROBIC AND ANAEROBIC Blood Culture adequate volume Performed at [place] [address] [place], [place] [address]    Culture   Final    NO GROWTH 4 DAYS Performed at [facility], [place] [address], [place], [place] [address]    Report Status PENDING  Incomplete  Urine Culture     Status: None   Collection Time: 05/16/18  3:03 AM  Result Value Ref Range Status   Specimen Description   Final    URINE, CLEAN CATCH Performed at [facility], [place] [address], [place], [place] [address]    Special Requests   Final    NONE Performed at [facility], [place] [address] [place], [place] [address]    Culture   Final    NO GROWTH Performed at [facility], [place] [address], [place], [place] [address]    Report Status 05/17/2018 FINAL  Final  MRSA PCR Screening     Status: None   Collection Time: 05/16/18 10:19 AM  Result Value Ref Range Status   MRSA by PCR NEGATIVE NEGATIVE Final     Comment:        The GeneXpert MRSA Assay (FDA approved for NASAL specimens only), is one component of a comprehensive MRSA colonization surveillance program. It is not intended to diagnose MRSA infection nor to guide or monitor treatment for MRSA infections. Performed at [facility], [place] [address], [place], [place] [address]      Labs: BNP (last 3 results) Recent Labs    05/16/18 0620  BNP 09.6   Basic Metabolic Panel: Recent Labs  Lab 05/15/18 2327 05/17/18 1519 05/19/18 0355 05/20/18 0548 05/20/18 0821  NA 138 141 140 143  --   K 5.6* 5.4* 4.1 6.1* 4.5  CL 91* 95* 91* 93*  --   CO2 34* 36* 41* 38*  --   GLUCOSE 99 87 89 81  --   BUN 10 <5* <5* 6  --   CREATININE <0.30* <0.30* <0.30* <0.30*  --   CALCIUM 8.6* 8.1* 8.3* 8.5*  --   MG  --  1.6*  --   --   --   PHOS  --  3.6  --   --   --    Liver Function Tests: Recent Labs  Lab 05/15/18 2327  AST 40  ALT 39  ALKPHOS 46  BILITOT 0.9  PROT 6.5  ALBUMIN 3.8   Recent Labs  Lab 05/15/18 2327  LIPASE 24   No results for input(s): AMMONIA in the last 168 hours. CBC: Recent Labs  Lab 05/15/18 2327 05/17/18 0500 05/19/18 0355  WBC 4.8 5.2 4.2  NEUTROABS 2.8  --  2.1  HGB 16.6 16.5 13.9  HCT 53.7* 52.9* 46.9  MCV 93.1 91.2 95.3  PLT 146* 127* 111*   Cardiac Enzymes: Recent Labs  Lab 05/15/18 2332  TROPONINI 0.03*   BNP: Invalid input(s): POCBNP CBG: Recent Labs  Lab 05/19/18 0528 05/19/18 1208 05/19/18 2345 05/20/18 0538 05/20/18 1201  GLUCAP 73 96 80 81 128*   D-Dimer No results for input(s): DDIMER in the last 72 hours. Hgb A1c No results for input(s): HGBA1C in the last 72 hours.  Lipid Profile No results for input(s): CHOL, HDL, LDLCALC, TRIG, CHOLHDL, LDLDIRECT in the last 72 hours. Thyroid function studies No results for input(s): TSH, T4TOTAL, T3FREE, THYROIDAB in the last 72 hours.  Invalid input(s): FREET3 Anemia work up No results for input(s):  VITAMINB12, FOLATE, FERRITIN, TIBC, IRON, RETICCTPCT in the last 72 hours. Urinalysis    Component Value Date/Time   COLORURINE YELLOW 05/16/2018 0303   APPEARANCEUR CLEAR 05/16/2018 0303   LABSPEC >1.046 (H) 05/16/2018 0303   PHURINE 5.0 05/16/2018 0303   GLUCOSEU NEGATIVE 05/16/2018 0303   HGBUR NEGATIVE 05/16/2018 0303   BILIRUBINUR NEGATIVE 05/16/2018 0303   KETONESUR NEGATIVE 05/16/2018 0303   PROTEINUR NEGATIVE 05/16/2018 0303   NITRITE NEGATIVE 05/16/2018 0303   LEUKOCYTESUR NEGATIVE 05/16/2018 0303   Sepsis Labs Invalid input(s): PROCALCITONIN,  WBC,  LACTICIDVEN Microbiology Recent Results (from the past 240 hour(s))  Blood culture (routine x 2)     Status: None (Preliminary result)   Collection Time: 05/15/18 11:25 PM  Result Value Ref Range Status   Specimen Description   Final    BLOOD LEFT FOREARM Performed at [facility], [place] [address], [place], [place] [address]    Special Requests   Final    BOTTLES DRAWN AEROBIC AND ANAEROBIC Blood Culture adequate volume Performed at [place] [address] [place], [place] [address]    Culture   Final    NO GROWTH 4 DAYS Performed at [facility], [place] [address], [place], [place] [address]    Report Status PENDING  Incomplete  Urine Culture     Status: None   Collection Time: 05/16/18  3:03 AM  Result Value Ref Range Status   Specimen Description   Final    URINE, CLEAN CATCH Performed at [facility], [place] [address], [place], [place] [address]    Special Requests   Final    NONE Performed at [facility], [place] [address] [place], [place] [address]    Culture   Final    NO GROWTH Performed at [facility], [place] [address], [place], [place] [address]    Report Status 05/17/2018 FINAL  Final  MRSA PCR Screening     Status: None   Collection Time: 05/16/18 10:19 AM  Result Value Ref Range Status   MRSA by PCR NEGATIVE NEGATIVE Final     Comment:        The GeneXpert MRSA Assay (FDA approved for NASAL specimens only), is one component of a comprehensive MRSA colonization surveillance program. It is not intended to diagnose MRSA infection nor to guide or monitor treatment for MRSA infections. Performed at [facility], [place] [address] [place], [place] [address]      Time coordinating discharge: 32 minutes  SIGNED:  Chipper Oman, MD  [facility] 05/20/2018, 12:28 PM  Pager please text page via  [email or web address]  Note - This record has been created using [employer]. Chart creation errors have been sought, but may not always have been located. Such creation errors do not reflect on the standard of medical care.

## 2018-05-20 NOTE — Progress Notes (Signed)
Patient given discharge instructions, and verbalized an understanding of all discharge instructions.  Patient agrees with discharge plan, and is being discharged in stable medical condition.  Patient given transportation via wheelchair. 

## 2018-05-21 LAB — CULTURE, BLOOD (ROUTINE X 2)
CULTURE: NO GROWTH
Special Requests: ADEQUATE

## 2018-06-03 ENCOUNTER — Encounter (HOSPITAL_COMMUNITY): Payer: Self-pay | Admitting: Emergency Medicine

## 2018-06-03 ENCOUNTER — Emergency Department (HOSPITAL_COMMUNITY): Payer: Managed Care, Other (non HMO)

## 2018-06-03 ENCOUNTER — Other Ambulatory Visit: Payer: Self-pay

## 2018-06-03 ENCOUNTER — Emergency Department (HOSPITAL_COMMUNITY)
Admission: EM | Admit: 2018-06-03 | Discharge: 2018-06-03 | Disposition: A | Discharge status: Home / Self Care | Payer: Managed Care, Other (non HMO) | Attending: Emergency Medicine | Admitting: Emergency Medicine

## 2018-06-03 DIAGNOSIS — R140 Abdominal distension (gaseous): Secondary | ICD-10-CM | POA: Insufficient documentation

## 2018-06-03 DIAGNOSIS — Z79899 Other long term (current) drug therapy: Secondary | ICD-10-CM | POA: Diagnosis not present

## 2018-06-03 DIAGNOSIS — G7101 Duchenne or Becker muscular dystrophy: Secondary | ICD-10-CM | POA: Insufficient documentation

## 2018-06-03 LAB — COMPREHENSIVE METABOLIC PANEL WITH GFR
ALT: 47 U/L — ABNORMAL HIGH (ref 0–44)
AST: 41 U/L (ref 15–41)
Albumin: 4.5 g/dL (ref 3.5–5.0)
Alkaline Phosphatase: 50 U/L (ref 38–126)
Anion gap: 16 — ABNORMAL HIGH (ref 5–15)
BUN: 8 mg/dL (ref 6–20)
CO2: 39 mmol/L — ABNORMAL HIGH (ref 22–32)
Calcium: 9.5 mg/dL (ref 8.9–10.3)
Chloride: 86 mmol/L — ABNORMAL LOW (ref 98–111)
Creatinine, Ser: <0.3 mg/dL — ABNORMAL LOW (ref 0.61–1.24)
Glucose, Bld: 94 mg/dL (ref 70–99)
Potassium: 4.2 mmol/L (ref 3.5–5.1)
Sodium: 141 mmol/L (ref 135–145)
Total Bilirubin: 1 mg/dL (ref 0.3–1.2)
Total Protein: 7.3 g/dL (ref 6.5–8.1)

## 2018-06-03 LAB — URINALYSIS, ROUTINE W REFLEX MICROSCOPIC
Bilirubin Urine: NEGATIVE
Glucose, UA: NEGATIVE mg/dL
KETONES UR: 80 mg/dL — AB
Leukocytes, UA: NEGATIVE
Nitrite: NEGATIVE
PROTEIN: 30 mg/dL — AB
RBC / HPF: >50 RBC/hpf — ABNORMAL HIGH (ref 0–5)
Specific Gravity, Urine: 1.015 (ref 1.005–1.030)
pH: 7 (ref 5.0–8.0)

## 2018-06-03 LAB — CBC WITH DIFFERENTIAL/PLATELET
Basophils Absolute: 0 10*3/uL (ref 0.0–0.1)
Basophils Relative: 0 %
EOS ABS: 0 10*3/uL (ref 0.0–0.7)
Eosinophils Relative: 0 %
HCT: 49.5 % (ref 39.0–52.0)
HEMOGLOBIN: 15.2 g/dL (ref 13.0–17.0)
LYMPHS ABS: 0.7 10*3/uL (ref 0.7–4.0)
Lymphocytes Relative: 12 %
MCH: 28.1 pg (ref 26.0–34.0)
MCHC: 30.7 g/dL (ref 30.0–36.0)
MCV: 91.5 fL (ref 78.0–100.0)
Monocytes Absolute: 0.4 10*3/uL (ref 0.1–1.0)
Monocytes Relative: 7 %
NEUTROS PCT: 81 %
Neutro Abs: 5 10*3/uL (ref 1.7–7.7)
Platelets: 113 10*3/uL — ABNORMAL LOW (ref 150–400)
RBC: 5.41 MIL/uL (ref 4.22–5.81)
RDW: 15.2 % (ref 11.5–15.5)
WBC: 6.1 10*3/uL (ref 4.0–10.5)

## 2018-06-03 LAB — LIPASE, BLOOD: Lipase: 25 U/L (ref 11–51)

## 2018-06-03 MED ORDER — SIMETHICONE 80 MG PO CHEW: 80.0000 mg | CHEWABLE_TABLET | Freq: Four times a day (QID) | ORAL | 0 refills | Status: DC | PRN

## 2018-06-03 NOTE — ED Provider Notes (Signed)
[facility] DEPT Provider Note   CSN: [id number] Arrival date & time: 06/03/18  6433     History   Chief Complaint Chief Complaint  Patient presents with  . Abdominal Pain    HPI Peter Hayes is a 22 y.o. male.  HPI Patient has a history of Duchenne's muscular dystrophy.  He wears 1 L of oxygen continuously.  He presents with "air in his stomach".  He was feeling constipated yesterday and drink warm prune juice with his MiraLAX.  Had 3 bowel movements yesterday.  No blood noted in the stool.  Overnight began having sensation of abdominal distention though he denies any pain.  No nausea or vomiting.  Currently denying any shortness of breath or chest pain. Past Medical History:  Diagnosis Date  . MD (muscular dystrophy) ([place])   . Refusal of blood product    patient is Peter Hayes witness    Patient Active Problem List   Diagnosis Date Noted  . Acute respiratory failure with hypoxia and hypercapnia ([place]) 05/16/2018  . 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 Hypotension   . 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 HCAP (healthcare-associated pneumonia) 12/01/2017  . Hypoglycemia without diagnosis of diabetes mellitus 09/24/2017  . Ileus ([place]) 09/23/2017  . Sepsis ([place]) 05/05/2016  . Muscular dystrophy ([place]) 05/05/2016  . Moderate protein-calorie malnutrition ([place]) 05/05/2016  . Sinus tachycardia 05/05/2016  . Leukocytosis 05/05/2016  . High anion gap metabolic acidosis 29/51/8841  . Respiratory alkalosis 05/05/2016    Past Surgical History:  Procedure Laterality Date  . EYE SURGERY    . FLEXIBLE SIGMOIDOSCOPY N/A 01/10/2018   Procedure: FLEXIBLE SIGMOIDOSCOPY;  Surgeon: Laurence Spates, MD;  Location: [facility] END[facility]COPY;  Service: Endoscopy;  Laterality: N/A;        Home Medications    Prior to Admission medications   Medication Sig Start Date End Date Taking? Authorizing Provider  acetaminophen (TYLENOL) 325 MG tablet Take 2 tablets (650 mg total) by mouth  every 6 (six) hours as needed for mild pain, moderate pain, fever or headache (or temp > 100). 01/15/18  Yes Earnstine Regal, PA-C  feeding supplement, ENSURE ENLIVE, (ENSURE ENLIVE) LIQD You can use whatever supplement he likes.  He needs about 1300 calories per day. 1.5 liters of fluid and 50 grams of protein per day. You can buy this at the grocery or drug store.  You do not need a prescription. Patient taking differently: Take 237 mLs by mouth 2 (two) times daily with a meal. You can use whatever supplement he likes.  He needs about 1300 calories per day. 1.5 liters of fluid and 50 grams of protein per day. You can buy this at the grocery or drug store.  You do not need a prescription. 01/15/18  Yes Earnstine Regal, PA-C  Multiple Vitamin (MULTIVITAMIN WITH MINERALS) TABS tablet You can get him a general multivitamin for daily use at any drug store Patient taking differently: Take 1 tablet by mouth daily.  01/15/18  Yes Earnstine Regal, PA-C  polyethylene glycol [facility] / Floria Raveling) packet Follow package instruction for daily use.  You need to have one soft bowel movement per day.  If he is not doing this call your primary care doctor.  You also need to be sure he takes in 1.5 liters of fluid per day.   He needs protein supplement daily, and needs to take in about 1300 calories per day. Patient taking differently: Take 17 g by mouth daily. Follow package instruction for daily use.  You need to have one soft bowel movement per day.  If he is not doing this call your primary care doctor.  You also need to be sure he takes in 1.5 liters of fluid per day.   He needs protein supplement daily, and needs to take in about 1300 calories per day. 01/15/18  Yes Earnstine Regal, PA-C  Sennosides (PRUNE SENNA CONCENTRATE PO) Take 8 oz by mouth daily as needed (constipation).   Yes [provider]  simethicone (GAS-X) 80 MG chewable tablet Chew 1 tablet (80 mg total) by mouth every 6 (six) hours as  needed for flatulence (distention). 06/03/18   Julianne Rice, MD    Family History History reviewed. No pertinent family history.  Social History Social History   Tobacco Use  . Smoking status: Never Smoker  . Smokeless tobacco: Never Used  Substance Use Topics  . Alcohol use: No  . Drug use: No     Allergies   Fentanyl   Review of Systems Review of Systems  Constitutional: Negative for chills and fever.  HENT: Negative for facial swelling.   Eyes: Negative for visual disturbance.  Respiratory: Negative for cough, shortness of breath and wheezing.   Cardiovascular: Negative for chest pain.  Gastrointestinal: Positive for abdominal distention. Negative for abdominal pain, blood in stool, constipation, diarrhea, nausea and vomiting.  Genitourinary: Negative for dysuria, flank pain and frequency.  Musculoskeletal: Negative for back pain, myalgias and neck pain.  Skin: Negative for rash and wound.  Neurological: Negative for dizziness, light-headedness, numbness and headaches.  All other systems reviewed and are negative.    Physical Exam Updated Vital Signs BP 123/65 (BP Location: Left Arm)   Pulse (!) 130   Temp 98.6 F (37 C) (Oral)   Resp 16   Ht $R'5\' 6"'Qa$  (1.676 m)   Wt 36.4 kg   SpO2 98%   BMI 12.95 kg/m   Physical Exam  Constitutional: He is oriented to person, place, and time. He appears well-developed and well-nourished.  Chronically ill-appearing.   HENT:  Head: Normocephalic and atraumatic.  Mouth/Throat: Oropharynx is clear and moist. No oropharyngeal exudate.  Eyes: Pupils are equal, round, and reactive to light. EOM are normal.  Neck: Normal range of motion. Neck supple.  Cardiovascular: Regular rhythm.  Tachycardia.  Pulmonary/Chest: Effort normal and breath sounds normal.  Abdominal: Soft. Bowel sounds are normal. There is no tenderness. There is no rebound and no guarding.  No tenderness to palpation.  Musculoskeletal: He exhibits no edema or  tenderness.  Extremities are contracted and atrophic  Neurological: He is alert and oriented to person, place, and time.  Skin: Skin is warm and dry. Capillary refill takes less than 2 seconds. No rash noted. No erythema.  Psychiatric: He has a normal mood and affect. His behavior is normal.  Nursing note and vitals reviewed.    ED Treatments / Results  Labs (all labs ordered are listed, but only abnormal results are displayed) Labs Reviewed  CBC WITH DIFFERENTIAL/PLATELET - Abnormal; Notable for the following components:      Result Value   Platelets 113 (*)    All other components within normal limits  COMPREHENSIVE METABOLIC PANEL - Abnormal; Notable for the following components:   Chloride 86 (*)    CO2 39 (*)    Creatinine, Ser <0.30 (*)    ALT 47 (*)    Anion gap 16 (*)    All other components within normal limits  URINALYSIS, ROUTINE W REFLEX MICROSCOPIC -  Abnormal; Notable for the following components:   APPearance HAZY (*)    Hgb urine dipstick LARGE (*)    Ketones, ur 80 (*)    Protein, ur 30 (*)    RBC / HPF >50 (*)    Bacteria, UA FEW (*)    All other components within normal limits  LIPASE, BLOOD    EKG None  Radiology Dg Abd Acute W/chest  Result Date: 06/03/2018 CLINICAL DATA:  Abdominal distention, constipation over the last few days, mid abdominal pain and shortness of breath, history of muscular dystrophy EXAM: DG ABDOMEN ACUTE W/ 1V CHEST COMPARISON:  Chest x-ray of 05/19/2018 and CT abdomen pelvis 05/16/2018 FINDINGS: No active infiltrate or effusion is seen. Mediastinal and hilar contours are unremarkable. The heart is within limits in size. Supine erect views the abdomen show a lumbar scoliosis convex to the left. Large and small bowel gas distention is noted without definite obstruction. On left lateral cubitus of the abdomen, no free intraperitoneal air is seen. No opaque calculi are noted. The right hip appears to have a somewhat shallow acetabulum.  IMPRESSION: 1. No active lung disease. 2. Gaseous distention of large and small bowel without evidence of obstruction. No evidence of free air. Electronically Signed   By: Ivar Drape M.D.   On: 06/03/2018 09:28    Procedures Procedures (including critical care time)  Medications Ordered in ED Medications - No data to display   Initial Impression / Assessment and Plan / ED Course  I have reviewed the triage vital signs and the nursing notes.  Pertinent labs & imaging results that were available during my care of the patient were reviewed by me and considered in my medical decision making (see chart for details).     No evidence of obstruction.  Labs appear to be at baseline.  Will start on simethicone.  Will need follow-up with his neurologist.  Strict return precautions given.  Final Clinical Impressions(s) / ED Diagnoses   Final diagnoses:  Gaseous abdominal distention    ED Discharge Orders         Ordered    simethicone (GAS-X) 80 MG chewable tablet  Every 6 hours PRN     06/03/18 1132           Julianne Rice, MD 06/03/18 1453

## 2018-06-03 NOTE — ED Notes (Signed)
Pt c/o of abdominal pain, and numbness in both hands and feet

## 2018-06-03 NOTE — ED Notes (Signed)
Pt's father came to this nurse asking to tell him (the father) information first.

## 2018-06-03 NOTE — ED Triage Notes (Signed)
Pt reports having abdominal pain and feeling short of breath when exhaling. Pt on oxygen continuously at 1L and is able to converse without difficulty.

## 2018-08-08 ENCOUNTER — Other Ambulatory Visit (HOSPITAL_COMMUNITY): Payer: Managed Care, Other (non HMO)

## 2018-08-08 ENCOUNTER — Inpatient Hospital Stay
Admission: RE | Admit: 2018-08-08 | Discharge: 2018-09-02 | Disposition: A | Discharge status: Home Health | Payer: Managed Care, Other (non HMO) | Source: Ambulatory Visit | Attending: Internal Medicine | Admitting: Internal Medicine

## 2018-08-08 DIAGNOSIS — K5900 Constipation, unspecified: Secondary | ICD-10-CM

## 2018-08-08 DIAGNOSIS — J9690 Respiratory failure, unspecified, unspecified whether with hypoxia or hypercapnia: Secondary | ICD-10-CM

## 2018-08-08 DIAGNOSIS — Z9911 Dependence on respirator [ventilator] status: Secondary | ICD-10-CM

## 2018-08-08 DIAGNOSIS — J189 Pneumonia, unspecified organism: Secondary | ICD-10-CM

## 2018-08-08 DIAGNOSIS — K567 Ileus, unspecified: Secondary | ICD-10-CM

## 2018-08-08 LAB — COMPREHENSIVE METABOLIC PANEL
ALT: 21 U/L (ref 0–44)
AST: 18 U/L (ref 15–41)
Albumin: 2.6 g/dL — ABNORMAL LOW (ref 3.5–5.0)
Alkaline Phosphatase: 89 U/L (ref 38–126)
Anion gap: 5 (ref 5–15)
BILIRUBIN TOTAL: 0.4 mg/dL (ref 0.3–1.2)
BUN: 10 mg/dL (ref 6–20)
CALCIUM: 9 mg/dL (ref 8.9–10.3)
CHLORIDE: 96 mmol/L — AB (ref 98–111)
CO2: 33 mmol/L — ABNORMAL HIGH (ref 22–32)
Creatinine, Ser: <0.3 mg/dL — ABNORMAL LOW (ref 0.61–1.24)
Glucose, Bld: 99 mg/dL (ref 70–99)
Potassium: 4.3 mmol/L (ref 3.5–5.1)
Sodium: 134 mmol/L — ABNORMAL LOW (ref 135–145)
TOTAL PROTEIN: 6.4 g/dL — AB (ref 6.5–8.1)

## 2018-08-08 LAB — CBC
HEMATOCRIT: 33.1 % — AB (ref 39.0–52.0)
Hemoglobin: 9.8 g/dL — ABNORMAL LOW (ref 13.0–17.0)
MCH: 27.9 pg (ref 26.0–34.0)
MCHC: 29.6 g/dL — AB (ref 30.0–36.0)
MCV: 94.3 fL (ref 80.0–100.0)
PLATELETS: 238 10*3/uL (ref 150–400)
RBC: 3.51 MIL/uL — ABNORMAL LOW (ref 4.22–5.81)
RDW: 14.2 % (ref 11.5–15.5)
WBC: 5.2 10*3/uL (ref 4.0–10.5)
nRBC: 0 % (ref 0.0–0.2)

## 2018-08-08 LAB — PHOSPHORUS: Phosphorus: 4.4 mg/dL (ref 2.5–4.6)

## 2018-08-08 LAB — PROTIME-INR
INR: 0.99
PROTHROMBIN TIME: 13 s (ref 11.4–15.2)

## 2018-08-08 LAB — MAGNESIUM: MAGNESIUM: 1.6 mg/dL — AB (ref 1.7–2.4)

## 2018-08-08 MED ORDER — LIDOCAINE 4 % EX PTCH
1.00 | MEDICATED_PATCH | CUTANEOUS | Status: DC
Start: 2018-08-08 — End: 2018-08-08

## 2018-08-08 MED ORDER — GENERIC EXTERNAL MEDICATION
Status: DC
Start: 2018-08-08 — End: 2018-08-08

## 2018-08-08 MED ORDER — NALOXONE HCL 0.4 MG/ML IJ SOLN
0.40 | INTRAMUSCULAR | Status: DC
Start: ? — End: 2018-08-08

## 2018-08-08 MED ORDER — HEPARIN SODIUM (PORCINE) 5000 UNIT/ML IJ SOLN
5000.00 | INTRAMUSCULAR | Status: DC
Start: 2018-08-08 — End: 2018-08-08

## 2018-08-08 MED ORDER — DEXTROSE 10 % IV SOLN
125.00 | INTRAVENOUS | Status: DC
Start: ? — End: 2018-08-08

## 2018-08-08 MED ORDER — GENERIC EXTERNAL MEDICATION
5.00 | Status: DC
Start: ? — End: 2018-08-08

## 2018-08-08 MED ORDER — IPRATROPIUM-ALBUTEROL 0.5-2.5 (3) MG/3ML IN SOLN
3.00 | RESPIRATORY_TRACT | Status: DC
Start: ? — End: 2018-08-08

## 2018-08-08 MED ORDER — HYDROMORPHONE HCL 1 MG/ML IJ SOLN
0.10 | INTRAMUSCULAR | Status: DC
Start: ? — End: 2018-08-08

## 2018-08-08 MED ORDER — PANTOPRAZOLE SODIUM 40 MG IV SOLR
40.00 | INTRAVENOUS | Status: DC
Start: 2018-08-08 — End: 2018-08-08

## 2018-08-08 MED ORDER — GENERIC EXTERNAL MEDICATION
Status: DC
Start: ? — End: 2018-08-08

## 2018-08-08 MED ORDER — FAMOTIDINE 20 MG/2ML IV SOLN
20.00 | INTRAVENOUS | Status: DC
Start: 2018-08-08 — End: 2018-08-08

## 2018-08-08 MED ORDER — COLLAGENASE 250 UNIT/GM EX OINT
TOPICAL_OINTMENT | CUTANEOUS | Status: DC
Start: 2018-08-08 — End: 2018-08-08

## 2018-08-08 MED ORDER — ONDANSETRON HCL 4 MG/2ML IJ SOLN
4.00 | INTRAMUSCULAR | Status: DC
Start: ? — End: 2018-08-08

## 2018-08-08 MED ORDER — ZINC OXIDE 40 % EX PSTE
PASTE | CUTANEOUS | Status: DC
Start: ? — End: 2018-08-08

## 2018-08-09 LAB — COMPREHENSIVE METABOLIC PANEL
ALK PHOS: 77 U/L (ref 38–126)
ALT: 19 U/L (ref 0–44)
AST: 15 U/L (ref 15–41)
Albumin: 2.4 g/dL — ABNORMAL LOW (ref 3.5–5.0)
Anion gap: 4 — ABNORMAL LOW (ref 5–15)
BUN: 10 mg/dL (ref 6–20)
CALCIUM: 8.5 mg/dL — AB (ref 8.9–10.3)
CO2: 34 mmol/L — AB (ref 22–32)
Chloride: 96 mmol/L — ABNORMAL LOW (ref 98–111)
Creatinine, Ser: <0.3 mg/dL — ABNORMAL LOW (ref 0.61–1.24)
Glucose, Bld: 120 mg/dL — ABNORMAL HIGH (ref 70–99)
Potassium: 4.2 mmol/L (ref 3.5–5.1)
Sodium: 134 mmol/L — ABNORMAL LOW (ref 135–145)
Total Bilirubin: 0.4 mg/dL (ref 0.3–1.2)
Total Protein: 5.9 g/dL — ABNORMAL LOW (ref 6.5–8.1)

## 2018-08-09 LAB — MAGNESIUM: MAGNESIUM: 1.6 mg/dL — AB (ref 1.7–2.4)

## 2018-08-09 LAB — PHOSPHORUS: PHOSPHORUS: 4.3 mg/dL (ref 2.5–4.6)

## 2018-08-09 LAB — TRIGLYCERIDES: Triglycerides: 24 mg/dL (ref <150)

## 2018-08-10 LAB — MAGNESIUM: MAGNESIUM: 1.6 mg/dL — AB (ref 1.7–2.4)

## 2018-08-12 LAB — BASIC METABOLIC PANEL
Anion gap: 4 — ABNORMAL LOW (ref 5–15)
BUN: 7 mg/dL (ref 6–20)
CHLORIDE: 92 mmol/L — AB (ref 98–111)
CO2: 40 mmol/L — AB (ref 22–32)
Calcium: 9 mg/dL (ref 8.9–10.3)
Glucose, Bld: 115 mg/dL — ABNORMAL HIGH (ref 70–99)
Potassium: 4.3 mmol/L (ref 3.5–5.1)
SODIUM: 136 mmol/L (ref 135–145)

## 2018-08-12 LAB — COMPREHENSIVE METABOLIC PANEL
ALT: 16 U/L (ref 0–44)
AST: 14 U/L — AB (ref 15–41)
Albumin: 2.5 g/dL — ABNORMAL LOW (ref 3.5–5.0)
Alkaline Phosphatase: 72 U/L (ref 38–126)
Anion gap: 3 — ABNORMAL LOW (ref 5–15)
BILIRUBIN TOTAL: 0.2 mg/dL — AB (ref 0.3–1.2)
BUN: 7 mg/dL (ref 6–20)
CO2: 42 mmol/L — ABNORMAL HIGH (ref 22–32)
Calcium: 9 mg/dL (ref 8.9–10.3)
Chloride: 89 mmol/L — ABNORMAL LOW (ref 98–111)
Glucose, Bld: 95 mg/dL (ref 70–99)
POTASSIUM: 4.4 mmol/L (ref 3.5–5.1)
Sodium: 134 mmol/L — ABNORMAL LOW (ref 135–145)
Total Protein: 6.2 g/dL — ABNORMAL LOW (ref 6.5–8.1)

## 2018-08-12 LAB — TRIGLYCERIDES: Triglycerides: 28 mg/dL (ref <150)

## 2018-08-12 LAB — MAGNESIUM: Magnesium: 1.7 mg/dL (ref 1.7–2.4)

## 2018-08-12 LAB — PHOSPHORUS: PHOSPHORUS: 4.4 mg/dL (ref 2.5–4.6)

## 2018-08-15 LAB — COMPREHENSIVE METABOLIC PANEL
ALBUMIN: 2.7 g/dL — AB (ref 3.5–5.0)
ALT: 70 U/L — ABNORMAL HIGH (ref 0–44)
AST: 41 U/L (ref 15–41)
Alkaline Phosphatase: 73 U/L (ref 38–126)
Anion gap: 9 (ref 5–15)
BILIRUBIN TOTAL: 0.2 mg/dL — AB (ref 0.3–1.2)
BUN: 9 mg/dL (ref 6–20)
CO2: 36 mmol/L — AB (ref 22–32)
Calcium: 9 mg/dL (ref 8.9–10.3)
Chloride: 88 mmol/L — ABNORMAL LOW (ref 98–111)
Creatinine, Ser: <0.3 mg/dL — ABNORMAL LOW (ref 0.61–1.24)
GLUCOSE: 117 mg/dL — AB (ref 70–99)
Potassium: 3.9 mmol/L (ref 3.5–5.1)
SODIUM: 133 mmol/L — AB (ref 135–145)
TOTAL PROTEIN: 7 g/dL (ref 6.5–8.1)

## 2018-08-15 LAB — PHOSPHORUS: Phosphorus: 5.1 mg/dL — ABNORMAL HIGH (ref 2.5–4.6)

## 2018-08-15 LAB — MAGNESIUM: Magnesium: 1.7 mg/dL (ref 1.7–2.4)

## 2018-08-15 LAB — TRIGLYCERIDES: TRIGLYCERIDES: 30 mg/dL (ref <150)

## 2018-08-18 LAB — MAGNESIUM: MAGNESIUM: 1.7 mg/dL (ref 1.7–2.4)

## 2018-08-18 LAB — COMPREHENSIVE METABOLIC PANEL
ALBUMIN: 2.6 g/dL — AB (ref 3.5–5.0)
ALT: 133 U/L — ABNORMAL HIGH (ref 0–44)
ANION GAP: 6 (ref 5–15)
AST: 56 U/L — ABNORMAL HIGH (ref 15–41)
Alkaline Phosphatase: 83 U/L (ref 38–126)
BUN: 15 mg/dL (ref 6–20)
CHLORIDE: 93 mmol/L — AB (ref 98–111)
CO2: 37 mmol/L — AB (ref 22–32)
Calcium: 9 mg/dL (ref 8.9–10.3)
Creatinine, Ser: <0.3 mg/dL — ABNORMAL LOW (ref 0.61–1.24)
GLUCOSE: 122 mg/dL — AB (ref 70–99)
POTASSIUM: 4 mmol/L (ref 3.5–5.1)
SODIUM: 136 mmol/L (ref 135–145)
Total Bilirubin: 0.2 mg/dL — ABNORMAL LOW (ref 0.3–1.2)
Total Protein: 6.6 g/dL (ref 6.5–8.1)

## 2018-08-18 LAB — PHOSPHORUS: Phosphorus: 4.4 mg/dL (ref 2.5–4.6)

## 2018-08-18 LAB — TRIGLYCERIDES: TRIGLYCERIDES: 38 mg/dL (ref <150)

## 2018-08-20 ENCOUNTER — Other Ambulatory Visit (HOSPITAL_COMMUNITY): Payer: Managed Care, Other (non HMO)

## 2018-08-21 LAB — COMPREHENSIVE METABOLIC PANEL
ALBUMIN: 2.8 g/dL — AB (ref 3.5–5.0)
ALT: 85 U/L — ABNORMAL HIGH (ref 0–44)
ANION GAP: 9 (ref 5–15)
AST: 39 U/L (ref 15–41)
Alkaline Phosphatase: 86 U/L (ref 38–126)
BILIRUBIN TOTAL: 0.2 mg/dL — AB (ref 0.3–1.2)
BUN: 17 mg/dL (ref 6–20)
CO2: 34 mmol/L — ABNORMAL HIGH (ref 22–32)
Calcium: 9 mg/dL (ref 8.9–10.3)
Chloride: 91 mmol/L — ABNORMAL LOW (ref 98–111)
Creatinine, Ser: <0.3 mg/dL — ABNORMAL LOW (ref 0.61–1.24)
GLUCOSE: 124 mg/dL — AB (ref 70–99)
POTASSIUM: 4.1 mmol/L (ref 3.5–5.1)
SODIUM: 134 mmol/L — AB (ref 135–145)
TOTAL PROTEIN: 6.9 g/dL (ref 6.5–8.1)

## 2018-08-21 LAB — PHOSPHORUS: Phosphorus: 4.4 mg/dL (ref 2.5–4.6)

## 2018-08-21 LAB — MAGNESIUM: Magnesium: 1.6 mg/dL — ABNORMAL LOW (ref 1.7–2.4)

## 2018-08-21 LAB — TRIGLYCERIDES: TRIGLYCERIDES: 91 mg/dL (ref <150)

## 2018-08-24 LAB — COMPREHENSIVE METABOLIC PANEL
ALT: 74 U/L — AB (ref 0–44)
AST: 35 U/L (ref 15–41)
Albumin: 2.9 g/dL — ABNORMAL LOW (ref 3.5–5.0)
Alkaline Phosphatase: 81 U/L (ref 38–126)
Anion gap: 9 (ref 5–15)
BUN: 17 mg/dL (ref 6–20)
CHLORIDE: 87 mmol/L — AB (ref 98–111)
CO2: 37 mmol/L — ABNORMAL HIGH (ref 22–32)
Calcium: 9.1 mg/dL (ref 8.9–10.3)
GLUCOSE: 108 mg/dL — AB (ref 70–99)
Potassium: 3.9 mmol/L (ref 3.5–5.1)
SODIUM: 133 mmol/L — AB (ref 135–145)
Total Bilirubin: 0.3 mg/dL (ref 0.3–1.2)
Total Protein: 7.1 g/dL (ref 6.5–8.1)

## 2018-08-24 LAB — PHOSPHORUS: Phosphorus: 5.2 mg/dL — ABNORMAL HIGH (ref 2.5–4.6)

## 2018-08-24 LAB — TRIGLYCERIDES: Triglycerides: 20 mg/dL (ref <150)

## 2018-08-24 LAB — MAGNESIUM: Magnesium: 1.7 mg/dL (ref 1.7–2.4)

## 2018-08-25 ENCOUNTER — Other Ambulatory Visit (HOSPITAL_COMMUNITY): Payer: Managed Care, Other (non HMO)

## 2018-08-27 ENCOUNTER — Other Ambulatory Visit (HOSPITAL_COMMUNITY): Payer: Managed Care, Other (non HMO)

## 2018-09-02 LAB — BLOOD GAS, ARTERIAL
Acid-Base Excess: 12.4 mmol/L — ABNORMAL HIGH (ref 0.0–2.0)
BICARBONATE: 37.6 mmol/L — AB (ref 20.0–28.0)
EXPIRATORY PAP: 5
FIO2: 30
INSPIRATORY PAP: 14
MODE: POSITIVE
O2 Saturation: 99.5 %
PH ART: 7.419 (ref 7.350–7.450)
Patient temperature: 97.4
pCO2 arterial: 58.7 mmHg — ABNORMAL HIGH (ref 32.0–48.0)
pO2, Arterial: 150 mmHg — ABNORMAL HIGH (ref 83.0–108.0)

## 2018-12-11 ENCOUNTER — Encounter: Payer: Self-pay | Admitting: Gastroenterology

## 2019-01-21 ENCOUNTER — Ambulatory Visit: Payer: Medicaid Other | Admitting: Gastroenterology

## 2019-01-26 ENCOUNTER — Other Ambulatory Visit: Payer: Self-pay

## 2019-01-26 ENCOUNTER — Telehealth (INDEPENDENT_AMBULATORY_CARE_PROVIDER_SITE_OTHER): Payer: Managed Care, Other (non HMO) | Admitting: Gastroenterology

## 2019-01-26 ENCOUNTER — Encounter: Payer: Self-pay | Admitting: Gastroenterology

## 2019-01-26 DIAGNOSIS — D509 Iron deficiency anemia, unspecified: Secondary | ICD-10-CM

## 2019-01-26 DIAGNOSIS — G7101 Duchenne or Becker muscular dystrophy: Secondary | ICD-10-CM | POA: Diagnosis not present

## 2019-01-26 NOTE — Progress Notes (Signed)
Chief Complaint:   Referring Provider:  Penni Bombard, PA      ASSESSMENT AND PLAN;   #1.  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  Family declined surgery.  Treated conservatively with home TPN.  Currently doing well.  #2.  Iron deficiency anemia with heme negative stools.  Responded well to iron.  Note that patient is Jehovah's Hayes and would not accept any blood or blood products.  #3. Duchenne'smuscular dystrophy and H/O respiratory failure.  Plan: -Continue home TPN for now.  Being managed by [facility]. -Continue p.o. intake for now.  He is also on MiraLAX 17 g p.o. once a day. -Continue iron supplements as before. -He had blood drawn yesterday by [facility].  I have talked to the nurse Jeneen Rinks and given my fax number.  He would fax the latest labs to our office. -Continue supportive therapy.   HPI:    Peter Hayes is a 23 y.o. male   Very complicated patient Was being followed by Dr. 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  Apparently had recurrence of symptoms and this time was found to have sigmoid volvulus requiring emergent sigmoid resection at [facility] complicated by anastomotic leak requiring end-colostomy.  Postoperative course was complicated by stage IV sacral decubitus and small bowel obstruction.  Patient's family was offered another surgery which they declined.  He was managed conservatively with IV fluids, TPN.  Per patient's father, he did well  Continues to be on TPN at home.  Has been able to tolerate p.o. like before.  Has been taking MiraLAX.  Found to be anemic with heme negative stools.  Hemoglobin of 8.  Started on liquid iron supplements.  Apparently, most recent hemoglobin was about normal per  patient's father.  He has chronic abdominal pain at the incision site.  No nausea, vomiting, increased abdominal distention, fever or chills.  He is not a candidate for any endoscopic intervention.   Past Medical History:  Diagnosis Date  . MD (muscular dystrophy) ([place])   . Refusal of blood product    patient is Peter Hayes    Past Surgical History:  Procedure Laterality Date  . EYE SURGERY    . FLEXIBLE SIGMOIDOSCOPY N/A 01/10/2018   Procedure: FLEXIBLE SIGMOIDOSCOPY;  Surgeon: Laurence Spates, MD;  Location: [facility] ENDOSCOPY;  Service: Endoscopy;  Laterality: N/A;    No family history on file.  Social History   Tobacco Use  . Smoking status: Never Smoker  . Smokeless tobacco: Never Used  Substance Use Topics  . Alcohol use: No  . Drug use: No    Current Outpatient Medications  Medication Sig Dispense Refill  . acetaminophen (TYLENOL) 325 MG tablet Take 2 tablets (650 mg total) by mouth every 6 (six) hours as needed for mild pain, moderate pain, fever or headache (or temp > 100).    . feeding supplement, ENSURE ENLIVE, (ENSURE ENLIVE) LIQD You can use whatever supplement he likes.  He needs about 1300 calories per day. 1.5 liters of fluid and 50 grams of protein per day. You can buy this at the grocery or drug store.  You do not need a prescription. (Patient taking differently: Take 237 mLs by mouth 2 (two) times daily with a meal. You can use whatever supplement he likes.  He needs about 1300 calories per day. 1.5 liters of fluid and 50 grams of protein per day. You can buy this at the grocery or drug store.  You do not need a prescription.) 237 mL 12  . Multiple Vitamin (MULTIVITAMIN WITH MINERALS) TABS tablet You can get him a general multivitamin for daily use at any drug store (Patient taking differently: Take 1 tablet by mouth daily. )    . polyethylene glycol (MIRALAX / GLYCOLAX) packet Follow package instruction for daily use.  You need to have one soft bowel movement  per day.  If he is not doing this call your primary care doctor.  You also need to be sure he takes in 1.5 liters of fluid per day.   He needs protein supplement daily, and needs to take in about 1300 calories per day. (Patient taking differently: Take 17 g by mouth daily. Follow package instruction for daily use.  You need to have one soft bowel movement per day.  If he is not doing this call your primary care doctor.  You also need to be sure he takes in 1.5 liters of fluid per day.   He needs protein supplement daily, and needs to take in about 1300 calories per day.) 14 each 0  . Sennosides (PRUNE SENNA CONCENTRATE PO) Take 8 oz by mouth daily as needed (constipation).    . simethicone (GAS-X) 80 MG chewable tablet Chew 1 tablet (80 mg total) by mouth every 6 (six) hours as needed for flatulence (distention). 30 tablet 0   No current facility-administered medications for this visit.     Allergies  Allergen Reactions  . Fentanyl Other (See Comments)    Dizziness    Review of Systems:  Not obtainable    Physical Exam:    Ht $R'5\' 6"'JI$  (1.676 m)   BMI 12.95 kg/m  There were no vitals filed for this visit. Not examined since it was a tele-visit  Data Reviewed: I have personally reviewed following labs and imaging studies  CBC: CBC Latest Ref Rng & Units 08/08/2018 06/03/2018 05/19/2018  WBC 4.0 - 10.5 K/uL 5.2 6.1 4.2  Hemoglobin 13.0 - 17.0 g/dL 9.8(L) 15.2 13.9  Hematocrit 39.0 - 52.0 % 33.1(L) 49.5 46.9  Platelets 150 - 400 K/uL 238 113(L) 111(L)    CMP: CMP Latest Ref Rng & Units 08/24/2018 08/21/2018 08/17/2018  Glucose 70 - 99 mg/dL 108(H) 124(H) 122(H)  BUN 6 - 20 mg/dL $Remove'17 17 15  'yVpdRjV$ Creatinine 0.61 - 1.24 mg/dL <0.30(L) <0.30(L) <0.30(L)  Sodium 135 - 145 mmol/L 133(L) 134(L) 136  Potassium 3.5 - 5.1 mmol/L 3.9 4.1 4.0  Chloride 98 - 111 mmol/L 87(L) 91(L) 93(L)  CO2 22 - 32 mmol/L 37(H) 34(H) 37(H)  Calcium 8.9 - 10.3 mg/dL 9.1 9.0 9.0  Total Protein 6.5 - 8.1 g/dL 7.1 6.9  6.6  Total Bilirubin 0.3 - 1.2 mg/dL 0.3 0.2(L) 0.2(L)  Alkaline Phos 38 - 126 U/L 81 86 83  AST 15 - 41 U/L 35 39 56(H)  ALT 0 - 44 U/L 74(H) 85(H) 133(H)    This service was provided via telemedicine.  The patient was located at home.  The provider was located in office.  The patient's dad did consent to this telephone visit and is aware of possible charges through their insurance for this visit.  All the above information was through patient's dad.  Time spent on call and coordination of care: 45 min   Carmell Austria, MD 01/26/2019, 3:29 PM  Cc: Penni Bombard, [place]

## 2019-02-18 NOTE — Progress Notes (Signed)
Addendum  -Labs from 02/08/2019 showed normal CMP except for AST 44, ALT 93. Hemoglobin has improved to 11.9, MCV 76 Rest of the labs were normal.

## 2019-02-22 ENCOUNTER — Encounter: Payer: Self-pay | Admitting: Gastroenterology

## 2019-03-03 ENCOUNTER — Telehealth: Payer: Self-pay | Admitting: Gastroenterology

## 2019-03-03 NOTE — Telephone Encounter (Signed)
Labs from 02/23/2019  Showed significant improvement in hemoglobin.  Hemoglobin now is 12.5, MCV 76.  LFTs mildly elevated at 43/65 with alk phos 135.  Getting better.  Normal BUN/creatinine.

## 2019-09-15 ENCOUNTER — Inpatient Hospital Stay (HOSPITAL_BASED_OUTPATIENT_CLINIC_OR_DEPARTMENT_OTHER)
Admission: EM | Admit: 2019-09-15 | Discharge: 2019-09-21 | DRG: 871 | Disposition: A | Discharge status: Home Health | Payer: Managed Care, Other (non HMO) | Attending: Internal Medicine | Admitting: Internal Medicine

## 2019-09-15 ENCOUNTER — Other Ambulatory Visit: Payer: Self-pay

## 2019-09-15 ENCOUNTER — Encounter (HOSPITAL_BASED_OUTPATIENT_CLINIC_OR_DEPARTMENT_OTHER): Payer: Self-pay | Admitting: *Deleted

## 2019-09-15 ENCOUNTER — Emergency Department (HOSPITAL_BASED_OUTPATIENT_CLINIC_OR_DEPARTMENT_OTHER): Payer: Managed Care, Other (non HMO)

## 2019-09-15 DIAGNOSIS — Z681 Body mass index (BMI) 19 or less, adult: Secondary | ICD-10-CM

## 2019-09-15 DIAGNOSIS — R110 Nausea: Secondary | ICD-10-CM | POA: Diagnosis present

## 2019-09-15 DIAGNOSIS — E873 Alkalosis: Secondary | ICD-10-CM | POA: Diagnosis present

## 2019-09-15 DIAGNOSIS — Z9981 Dependence on supplemental oxygen: Secondary | ICD-10-CM

## 2019-09-15 DIAGNOSIS — J9622 Acute and chronic respiratory failure with hypercapnia: Secondary | ICD-10-CM | POA: Diagnosis present

## 2019-09-15 DIAGNOSIS — R6520 Severe sepsis without septic shock: Secondary | ICD-10-CM | POA: Diagnosis present

## 2019-09-15 DIAGNOSIS — D696 Thrombocytopenia, unspecified: Secondary | ICD-10-CM | POA: Diagnosis present

## 2019-09-15 DIAGNOSIS — I959 Hypotension, unspecified: Secondary | ICD-10-CM | POA: Diagnosis not present

## 2019-09-15 DIAGNOSIS — R636 Underweight: Secondary | ICD-10-CM | POA: Diagnosis present

## 2019-09-15 DIAGNOSIS — L8990 Pressure ulcer of unspecified site, unspecified stage: Secondary | ICD-10-CM | POA: Insufficient documentation

## 2019-09-15 DIAGNOSIS — J9621 Acute and chronic respiratory failure with hypoxia: Secondary | ICD-10-CM | POA: Diagnosis present

## 2019-09-15 DIAGNOSIS — Z79899 Other long term (current) drug therapy: Secondary | ICD-10-CM

## 2019-09-15 DIAGNOSIS — Z20828 Contact with and (suspected) exposure to other viral communicable diseases: Secondary | ICD-10-CM | POA: Diagnosis present

## 2019-09-15 DIAGNOSIS — R000 Tachycardia, unspecified: Secondary | ICD-10-CM | POA: Diagnosis not present

## 2019-09-15 DIAGNOSIS — J9601 Acute respiratory failure with hypoxia: Secondary | ICD-10-CM | POA: Diagnosis present

## 2019-09-15 DIAGNOSIS — J189 Pneumonia, unspecified organism: Secondary | ICD-10-CM | POA: Diagnosis present

## 2019-09-15 DIAGNOSIS — A419 Sepsis, unspecified organism: Principal | ICD-10-CM | POA: Diagnosis present

## 2019-09-15 DIAGNOSIS — D638 Anemia in other chronic diseases classified elsewhere: Secondary | ICD-10-CM | POA: Diagnosis present

## 2019-09-15 DIAGNOSIS — L8992 Pressure ulcer of unspecified site, stage 2: Secondary | ICD-10-CM | POA: Diagnosis present

## 2019-09-15 DIAGNOSIS — J9602 Acute respiratory failure with hypercapnia: Secondary | ICD-10-CM | POA: Diagnosis not present

## 2019-09-15 DIAGNOSIS — I950 Idiopathic hypotension: Secondary | ICD-10-CM | POA: Diagnosis not present

## 2019-09-15 DIAGNOSIS — Z885 Allergy status to narcotic agent status: Secondary | ICD-10-CM | POA: Diagnosis not present

## 2019-09-15 DIAGNOSIS — G7101 Duchenne or Becker muscular dystrophy: Secondary | ICD-10-CM | POA: Diagnosis present

## 2019-09-15 DIAGNOSIS — E876 Hypokalemia: Secondary | ICD-10-CM | POA: Diagnosis present

## 2019-09-15 DIAGNOSIS — D509 Iron deficiency anemia, unspecified: Secondary | ICD-10-CM | POA: Diagnosis not present

## 2019-09-15 LAB — BASIC METABOLIC PANEL
Anion gap: 12 (ref 5–15)
BUN: 14 mg/dL (ref 6–20)
CO2: 31 mmol/L (ref 22–32)
Calcium: 9.4 mg/dL (ref 8.9–10.3)
Chloride: 95 mmol/L — ABNORMAL LOW (ref 98–111)
Creatinine, Ser: <0.3 mg/dL — ABNORMAL LOW (ref 0.61–1.24)
Glucose, Bld: 193 mg/dL — ABNORMAL HIGH (ref 70–99)
Potassium: 4.9 mmol/L (ref 3.5–5.1)
Sodium: 138 mmol/L (ref 135–145)

## 2019-09-15 LAB — CBC WITH DIFFERENTIAL/PLATELET
Abs Immature Granulocytes: 0.05 10*3/uL (ref 0.00–0.07)
Basophils Absolute: 0.1 10*3/uL (ref 0.0–0.1)
Basophils Relative: 0 %
Eosinophils Absolute: 0 10*3/uL (ref 0.0–0.5)
Eosinophils Relative: 0 %
HCT: 49.1 % (ref 39.0–52.0)
Hemoglobin: 15.4 g/dL (ref 13.0–17.0)
Immature Granulocytes: 0 %
Lymphocytes Relative: 11 %
Lymphs Abs: 2 10*3/uL (ref 0.7–4.0)
MCH: 27.8 pg (ref 26.0–34.0)
MCHC: 31.4 g/dL (ref 30.0–36.0)
MCV: 88.8 fL (ref 80.0–100.0)
Monocytes Absolute: 2 10*3/uL — ABNORMAL HIGH (ref 0.1–1.0)
Monocytes Relative: 11 %
Neutro Abs: 14.2 10*3/uL — ABNORMAL HIGH (ref 1.7–7.7)
Neutrophils Relative %: 78 %
Platelets: 283 10*3/uL (ref 150–400)
RBC: 5.53 MIL/uL (ref 4.22–5.81)
RDW: 14.3 % (ref 11.5–15.5)
WBC: 18.4 10*3/uL — ABNORMAL HIGH (ref 4.0–10.5)
nRBC: 0 % (ref 0.0–0.2)

## 2019-09-15 LAB — URINALYSIS, ROUTINE W REFLEX MICROSCOPIC
Bilirubin Urine: NEGATIVE
Glucose, UA: 100 mg/dL — AB
Hgb urine dipstick: NEGATIVE
Ketones, ur: >80 mg/dL — AB
Leukocytes,Ua: NEGATIVE
Nitrite: NEGATIVE
Protein, ur: NEGATIVE mg/dL
Specific Gravity, Urine: 1.02 (ref 1.005–1.030)
pH: 7.5 (ref 5.0–8.0)

## 2019-09-15 LAB — SARS CORONAVIRUS 2 AG (30 MIN TAT): SARS Coronavirus 2 Ag: NEGATIVE

## 2019-09-15 LAB — D-DIMER, QUANTITATIVE: D-Dimer, Quant: 0.34 ug/mL-FEU (ref 0.00–0.50)

## 2019-09-15 MED ORDER — METOPROLOL TARTRATE 25 MG PO TABS
25.0000 mg | ORAL_TABLET | Freq: Three times a day (TID) | ORAL | Status: DC
  Administered 2019-09-15 – 2019-09-18 (×6): 25 mg via ORAL
  Filled 2019-09-15 (×10): qty 1

## 2019-09-15 MED ORDER — MAGNESIUM OXIDE 400 MG PO TABS
400.0000 mg | ORAL_TABLET | Freq: Three times a day (TID) | ORAL | Status: DC
  Administered 2019-09-15: 23:00:00 400 mg via ORAL
  Filled 2019-09-15 (×2): qty 1

## 2019-09-15 MED ORDER — ACETAMINOPHEN 650 MG RE SUPP
650.0000 mg | Freq: Once | RECTAL | Status: AC
  Administered 2019-09-15: 650 mg via RECTAL
  Filled 2019-09-15: qty 1

## 2019-09-15 NOTE — ED Notes (Signed)
Removed patient from BiPap to humidified [place] @ 6L. Patient maintaining oxygen saturations of 100% with RR's of 20's. Patient tolerated well.

## 2019-09-15 NOTE — ED Provider Notes (Signed)
[facility] EMERGENCY DEPARTMENT Provider Note   CSN: [id number] Arrival date & time: 09/15/19  1532     History   Chief Complaint Chief Complaint  Patient presents with  . Cough    HPI Peter Hayes is a 23 y.o. male.     Patient is a 23 year old male with a history of muscular dystrophy who presents with cough and shortness of breath.  He has had a cough for the last 3 days and became short of breath today.  He is on chronic oxygen at home at 1 L/min but he feels more short of breath than he normally does.  He is not able to cough up anything but he does feel congested in his chest.  No known fevers.  No vomiting.  No associated chest pain.  No leg swelling.     Past Medical History:  Diagnosis Date  . MD (muscular dystrophy) ([place])   . Refusal of blood product    patient is Peter Hayes witness    Patient Active Problem List   Diagnosis Date Noted  . Acute respiratory failure with hypoxia and hypercapnia ([place]) 05/16/2018  . 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 Hypotension   . 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 HCAP (healthcare-associated pneumonia) 12/01/2017  . Hypoglycemia without diagnosis of diabetes mellitus 09/24/2017  . Ileus ([place]) 09/23/2017  . Sepsis ([place]) 05/05/2016  . Muscular dystrophy ([place]) 05/05/2016  . Moderate protein-calorie malnutrition ([place]) 05/05/2016  . Sinus tachycardia 05/05/2016  . Leukocytosis 05/05/2016  . High anion gap metabolic acidosis 28/31/5176  . Respiratory alkalosis 05/05/2016    Past Surgical History:  Procedure Laterality Date  . EYE SURGERY    . FLEXIBLE SIGMOIDOSCOPY N/A 01/10/2018   Procedure: FLEXIBLE SIGMOIDOSCOPY;  Surgeon: Laurence Spates, MD;  Location: [facility] END[facility]COPY;  Service: Endoscopy;  Laterality: N/A;        Home Medications    Prior to Admission medications   Medication Sig Start Date End Date Taking? Authorizing Provider  acetaminophen (TYLENOL) 325 MG tablet Take 2 tablets (650 mg total) by mouth every 6  (six) hours as needed for mild pain, moderate pain, fever or headache (or temp > 100). 01/15/18  Yes Earnstine Regal, PA-C  feeding supplement, ENSURE ENLIVE, (ENSURE ENLIVE) LIQD You can use whatever supplement he likes.  He needs about 1300 calories per day. 1.5 liters of fluid and 50 grams of protein per day. You can buy this at the grocery or drug store.  You do not need a prescription. Patient taking differently: Take 237 mLs by mouth 2 (two) times daily with a meal. You can use whatever supplement he likes.  He needs about 1300 calories per day. 1.5 liters of fluid and 50 grams of protein per day. You can buy this at the grocery or drug store.  You do not need a prescription. 01/15/18  Yes Earnstine Regal, PA-C  ferrous sulfate 220 (44 Fe) MG/5ML solution Take 220 mg by mouth daily.   Yes [provider]  magnesium oxide (MAG-OX) 400 MG tablet Take 400 mg by mouth 3 (three) times daily.   Yes [provider]  metoprolol tartrate (LOPRESSOR) 25 MG tablet Take 25 mg by mouth 3 (three) times daily.   Yes [provider]  Multiple Vitamin (MULTIVITAMIN WITH MINERALS) TABS tablet You can get him a general multivitamin for daily use at any drug store Patient taking differently: Take 1 tablet by mouth daily.  01/15/18   Earnstine Regal, PA-C  polyethylene glycol [facility] /  GLYCOLAX) packet Follow package instruction for daily use.  You need to have one soft bowel movement per day.  If he is not doing this call your primary care doctor.  You also need to be sure he takes in 1.5 liters of fluid per day.   He needs protein supplement daily, and needs to take in about 1300 calories per day. Patient taking differently: Take 17 g by mouth daily. Follow package instruction for daily use.  You need to have one soft bowel movement per day.  If he is not doing this call your primary care doctor.  You also need to be sure he takes in 1.5 liters of fluid per day.   He needs protein  supplement daily, and needs to take in about 1300 calories per day. 01/15/18   Earnstine Regal, PA-C  Sennosides (PRUNE SENNA CONCENTRATE PO) Take 8 oz by mouth daily as needed (constipation).    [provider]  simethicone (GAS-X) 80 MG chewable tablet Chew 1 tablet (80 mg total) by mouth every 6 (six) hours as needed for flatulence (distention). 06/03/18   Julianne Rice, MD    Family History No family history on file.  Social History Social History   Tobacco Use  . Smoking status: Never Smoker  . Smokeless tobacco: Never Used  Substance Use Topics  . Alcohol use: No  . Drug use: No     Allergies   Fentanyl   Review of Systems Review of Systems  Constitutional: Positive for fatigue. Negative for chills, diaphoresis and fever.  HENT: Negative for congestion, rhinorrhea and sneezing.   Eyes: Negative.   Respiratory: Positive for cough and shortness of breath. Negative for chest tightness.   Cardiovascular: Negative for chest pain and leg swelling.  Gastrointestinal: Negative for abdominal pain, blood in stool, diarrhea, nausea and vomiting.  Genitourinary: Negative for difficulty urinating, flank pain, frequency and hematuria.  Musculoskeletal: Negative for arthralgias and back pain.  Skin: Negative for rash.  Neurological: Negative for dizziness, speech difficulty, weakness, numbness and headaches.     Physical Exam Updated Vital Signs BP 97/65   Pulse (!) 137   Temp (!) 100.4 F (38 C) (Rectal)   Resp (!) 26   Wt 48.8 kg   SpO2 100%   BMI 17.36 kg/m   Physical Exam Constitutional:      Appearance: He is well-developed.  HENT:     Head: Normocephalic and atraumatic.  Eyes:     Pupils: Pupils are equal, round, and reactive to light.  Neck:     Musculoskeletal: Normal range of motion and neck supple.  Cardiovascular:     Rate and Rhythm: Regular rhythm. Tachycardia present.     Heart sounds: Normal heart sounds.  Pulmonary:     Effort:  Tachypnea and accessory muscle usage present. No respiratory distress.     Breath sounds: Rhonchi present. No wheezing or rales.  Chest:     Chest wall: No tenderness.  Abdominal:     General: Bowel sounds are normal.     Palpations: Abdomen is soft.     Tenderness: There is no abdominal tenderness. There is no guarding or rebound.  Musculoskeletal: Normal range of motion.  Lymphadenopathy:     Cervical: No cervical adenopathy.  Skin:    General: Skin is warm and dry.     Findings: No rash.  Neurological:     Mental Status: He is alert and oriented to person, place, and time.      ED  Treatments / Results  Labs (all labs ordered are listed, but only abnormal results are displayed) Labs Reviewed  CBC WITH DIFFERENTIAL/PLATELET - Abnormal; Notable for the following components:      Result Value   WBC 18.4 (*)    Neutro Abs 14.2 (*)    Monocytes Absolute 2.0 (*)    All other components within normal limits  BASIC METABOLIC PANEL - Abnormal; Notable for the following components:   Chloride 95 (*)    Glucose, Bld 193 (*)    Creatinine, Ser <0.30 (*)    All other components within normal limits  POCT I-STAT EG7 - Abnormal; Notable for the following components:   pH, Ven 7.239 (*)    pCO2, Ven 89.6 (*)    pO2, Ven 27.0 (*)    Bicarbonate 38.5 (*)    TCO2 41 (*)    Acid-Base Excess 7.0 (*)    Potassium 7.5 (*)    Calcium, Ion 1.03 (*)    All other components within normal limits  SARS CORONAVIRUS 2 AG (30 MIN TAT)  SARS CORONAVIRUS 2 (TAT 6-24 HRS)  RESPIRATORY PANEL BY RT PCR (FLU A&B, COVID)  D-DIMER, QUANTITATIVE (NOT AT [facility])  BLOOD GAS, ARTERIAL    EKG EKG Interpretation  Date/Time:  Tuesday September 15 2019 15:45:02 EST Ventricular Rate:  119 PR Interval:    QRS Duration: 95 QT Interval:  324 QTC Calculation: 456 R Axis:   100 Text Interpretation: Sinus tachycardia Multiple premature complexes, vent & supraven Probable left atrial enlargement Borderline  right axis deviation Baseline wander in lead(s) III aVF V2 V4 since last tracing no significant change Confirmed by Malvin Johns [phone]) on 09/15/2019 4:45:15 PM   Radiology Dg Chest Portable 1 View  Result Date: 09/15/2019 CLINICAL DATA:  Cough and shortness of breath for several days, initial encounter EXAM: PORTABLE CHEST 1 VIEW COMPARISON:  08/08/2018 FINDINGS: Cardiac shadows within normal limits. The lungs are hypoinflated without focal confluent infiltrate or effusion. Scattered bowel gas is noted in the upper abdomen. No bony abnormality is seen. IMPRESSION: Poor inspiratory effort without acute abnormality. Electronically Signed   By: Inez Catalina M.D.   On: 09/15/2019 16:04    Procedures Procedures (including critical care time)  Medications Ordered in ED Medications  magnesium oxide (MAG-OX) tablet 400 mg (has no administration in time range)  metoprolol tartrate (LOPRESSOR) tablet 25 mg (has no administration in time range)  acetaminophen (TYLENOL) suppository 650 mg (650 mg Rectal Given 09/15/19 1958)     Initial Impression / Assessment and Plan / ED Course  I have reviewed the triage vital signs and the nursing notes.  Pertinent labs & imaging results that were available during my care of the patient were reviewed by me and considered in my medical decision making (see chart for details).        Patient is a 23 year old male with a history of muscular dystrophy who presents with shortness of breath, increased work of breathing and hypoxia.  His chest x-ray is clear without evidence of pneumonia or edema.  He initially was afebrile but did develop a slight fever while in the ED.  After being put on oxygen, he did improve his oxygenation but slowly became less responsive and was suspected to be retaining CO2.  ABG was attempted but was unsuccessful.  VBG showed an acidosis with hypercapnia.  He was put on BiPAP and he improved after this.  He became much more alert and  interactive.  He ultimately became  back to his baseline level.  His rapid Covid was negative.  I initially spoke with Dr. Alcario Drought about admitting the patient to [facility] although he was concerned about him being on BiPAP with febrile illness and only a rapid Covid back.  I spoke with Dr. Lamont Snowball with pulmonology who advised that we should try to avoid intubation if possible and recommended possibly trying the patient off BiPAP.  Patient was maintained on BiPAP for another hour or so and then transition to a nasal cannula.  It was recommended by pulmonary that we maintain his oxygen saturations on the lower end between 89-90 to prevent CO2 retention.  I did speak with Dr. Alcario Drought again and he is excepted the patient for admission to [facility] long.  We have ordered a 2-hour Covid test but this is pending.  His labs show mild elevation in his WBC count.  Otherwise unremarkable.  CRITICAL CARE Performed by: Malvin Johns Total critical care time: 60 minutes Critical care time was exclusive of separately billable procedures and treating other patients. Critical care was necessary to treat or prevent imminent or life-threatening deterioration. Critical care was time spent personally by me on the following activities: development of treatment plan with patient and/or surrogate as well as nursing, discussions with consultants, evaluation of patient's response to treatment, examination of patient, obtaining history from patient or surrogate, ordering and performing treatments and interventions, ordering and review of laboratory studies, ordering and review of radiographic studies, pulse oximetry and re-evaluation of patient's condition.  Peter Hayes was evaluated in Emergency Department on 09/15/2019 for the symptoms described in the history of present illness. He was evaluated in the context of the global COVID-19 pandemic, which necessitated consideration that the patient might be at risk for infection with the  SARS-CoV-2 virus that causes COVID-19. Institutional protocols and algorithms that pertain to the evaluation of patients at risk for COVID-19 are in a state of rapid change based on information released by regulatory bodies including the CDC and federal and state organizations. These policies and algorithms were followed during the patient's care in the ED.    Final Clinical Impressions(s) / ED Diagnoses   Final diagnoses:  Acute respiratory failure with hypoxia and hypercapnia [facility])    ED Discharge Orders    None       Malvin Johns, MD 09/15/19 2255

## 2019-09-15 NOTE — ED Notes (Signed)
Unsuccessful phlebotomy blood draw, left hand

## 2019-09-15 NOTE — Plan of Care (Addendum)
24 yo M with h/o MD.  On 1L O2 at baseline.  Presents to ED with 3 day h/o worsening SOB, cough.  Found to have acute hypoxic and hypercapnic failure with VBG showing pH 7.23, PCO2 89.6.  Patient initially stabilized on BIPAP, however now febrile to 100.4.  Unable to take patient who is a PUI for COVID on BIPAP.  I suggested verifying code status to EDP.  EDP spoke with PCCM who suggested trying him off of BIPAP and seeing how he does.  EDP taking patient off of BIPAP, requesting bed request.  Will put in for SDU, I think he is hit risk to require intubation tonight.  5am UPDATE:  Apparently tolerating being off of BIPAP well.  COVID has come back negative as has flu on his RVP.  Will therefore also order procalcitonin and empiric ABx for presumed CAP.  CAP pathway, NPO, will put in as full code empirically until code status can be confirmed.  Again difficult to do all this management remotely as patient is still at [facility] due to lack of bed availability (this related to the current statewide / nationwide critical bed shortage due to [place] pandemic).

## 2019-09-15 NOTE — ED Triage Notes (Signed)
Pt c/o cough  x 2 days and SOb x 1 day

## 2019-09-16 ENCOUNTER — Encounter (HOSPITAL_COMMUNITY): Payer: Self-pay | Admitting: Internal Medicine

## 2019-09-16 DIAGNOSIS — A419 Sepsis, unspecified organism: Secondary | ICD-10-CM | POA: Diagnosis present

## 2019-09-16 DIAGNOSIS — J9601 Acute respiratory failure with hypoxia: Secondary | ICD-10-CM

## 2019-09-16 DIAGNOSIS — R6520 Severe sepsis without septic shock: Secondary | ICD-10-CM

## 2019-09-16 HISTORY — DX: Sepsis, unspecified organism: A41.9

## 2019-09-16 HISTORY — DX: Severe sepsis without septic shock: R65.20

## 2019-09-16 LAB — CBC WITH DIFFERENTIAL/PLATELET
Abs Immature Granulocytes: 0.02 10*3/uL (ref 0.00–0.07)
Basophils Absolute: 0 10*3/uL (ref 0.0–0.1)
Basophils Relative: 0 %
Eosinophils Absolute: 0 10*3/uL (ref 0.0–0.5)
Eosinophils Relative: 0 %
HCT: 38.5 % — ABNORMAL LOW (ref 39.0–52.0)
Hemoglobin: 11.6 g/dL — ABNORMAL LOW (ref 13.0–17.0)
Immature Granulocytes: 0 %
Lymphocytes Relative: 16 %
Lymphs Abs: 1.1 10*3/uL (ref 0.7–4.0)
MCH: 27.9 pg (ref 26.0–34.0)
MCHC: 30.1 g/dL (ref 30.0–36.0)
MCV: 92.5 fL (ref 80.0–100.0)
Monocytes Absolute: 0.9 10*3/uL (ref 0.1–1.0)
Monocytes Relative: 13 %
Neutro Abs: 4.6 10*3/uL (ref 1.7–7.7)
Neutrophils Relative %: 71 %
Platelets: 113 10*3/uL — ABNORMAL LOW (ref 150–400)
RBC: 4.16 MIL/uL — ABNORMAL LOW (ref 4.22–5.81)
RDW: 14.3 % (ref 11.5–15.5)
WBC: 6.6 10*3/uL (ref 4.0–10.5)
nRBC: 0 % (ref 0.0–0.2)

## 2019-09-16 LAB — LACTIC ACID, PLASMA
Lactic Acid, Venous: 0.5 mmol/L (ref 0.5–1.9)
Lactic Acid, Venous: 0.4 mmol/L — ABNORMAL LOW (ref 0.5–1.9)

## 2019-09-16 LAB — RESPIRATORY PANEL BY PCR

## 2019-09-16 LAB — RESPIRATORY PANEL BY RT PCR (FLU A&B, COVID)
Influenza A by PCR: NEGATIVE
Influenza B by PCR: NEGATIVE
SARS Coronavirus 2 by RT PCR: NEGATIVE

## 2019-09-16 LAB — BASIC METABOLIC PANEL
Anion gap: 6 (ref 5–15)
Anion gap: 10 (ref 5–15)
BUN: 9 mg/dL (ref 6–20)
BUN: 7 mg/dL (ref 6–20)
CO2: 25 mmol/L (ref 22–32)
CO2: 29 mmol/L (ref 22–32)
Calcium: 7.5 mg/dL — ABNORMAL LOW (ref 8.9–10.3)
Calcium: 8.2 mg/dL — ABNORMAL LOW (ref 8.9–10.3)
Chloride: 109 mmol/L (ref 98–111)
Chloride: 104 mmol/L (ref 98–111)
Creatinine, Ser: <0.3 mg/dL — ABNORMAL LOW (ref 0.61–1.24)
Creatinine, Ser: <0.3 mg/dL — ABNORMAL LOW (ref 0.61–1.24)
Glucose, Bld: 62 mg/dL — ABNORMAL LOW (ref 70–99)
Glucose, Bld: 69 mg/dL — ABNORMAL LOW (ref 70–99)
Potassium: 2.9 mmol/L — ABNORMAL LOW (ref 3.5–5.1)
Potassium: 3.3 mmol/L — ABNORMAL LOW (ref 3.5–5.1)
Sodium: 140 mmol/L (ref 135–145)
Sodium: 143 mmol/L (ref 135–145)

## 2019-09-16 LAB — MRSA PCR SCREENING: MRSA by PCR: NEGATIVE

## 2019-09-16 LAB — CBC
HCT: 31.9 % — ABNORMAL LOW (ref 39.0–52.0)
Hemoglobin: 9.7 g/dL — ABNORMAL LOW (ref 13.0–17.0)
MCH: 28.1 pg (ref 26.0–34.0)
MCHC: 30.4 g/dL (ref 30.0–36.0)
MCV: 92.5 fL (ref 80.0–100.0)
Platelets: 97 10*3/uL — ABNORMAL LOW (ref 150–400)
RBC: 3.45 MIL/uL — ABNORMAL LOW (ref 4.22–5.81)
RDW: 14.3 % (ref 11.5–15.5)
WBC: 7 10*3/uL (ref 4.0–10.5)
nRBC: 0 % (ref 0.0–0.2)

## 2019-09-16 LAB — STREP PNEUMONIAE URINARY ANTIGEN: Strep Pneumo Urinary Antigen: NEGATIVE

## 2019-09-16 LAB — SARS CORONAVIRUS 2 (TAT 6-24 HRS): SARS Coronavirus 2: NEGATIVE

## 2019-09-16 LAB — PROCALCITONIN: Procalcitonin: 0.18 ng/mL

## 2019-09-16 MED ORDER — ORAL CARE MOUTH RINSE
15.0000 mL | Freq: Two times a day (BID) | OROMUCOSAL | Status: DC
  Administered 2019-09-16 – 2019-09-20 (×6): 15 mL via OROMUCOSAL

## 2019-09-16 MED ORDER — SODIUM CHLORIDE 0.9 % IV SOLN
500.0000 mg | INTRAVENOUS | Status: DC
  Administered 2019-09-16 – 2019-09-17 (×2): 500 mg via INTRAVENOUS
  Filled 2019-09-16 (×2): qty 500

## 2019-09-16 MED ORDER — ACETAMINOPHEN 325 MG PO TABS
650.0000 mg | ORAL_TABLET | Freq: Four times a day (QID) | ORAL | Status: DC | PRN
  Administered 2019-09-18 – 2019-09-19 (×2): 650 mg via ORAL
  Filled 2019-09-16 (×2): qty 2

## 2019-09-16 MED ORDER — MAGNESIUM OXIDE 400 (241.3 MG) MG PO TABS
400.0000 mg | ORAL_TABLET | Freq: Three times a day (TID) | ORAL | Status: DC
  Administered 2019-09-16 – 2019-09-21 (×15): 400 mg via ORAL
  Filled 2019-09-16 (×15): qty 1

## 2019-09-16 MED ORDER — ENOXAPARIN SODIUM 40 MG/0.4ML ~~LOC~~ SOLN
40.0000 mg | SUBCUTANEOUS | Status: DC
  Filled 2019-09-16: qty 0.4

## 2019-09-16 MED ORDER — ORAL CARE MOUTH RINSE
15.0000 mL | Freq: Two times a day (BID) | OROMUCOSAL | Status: DC
  Administered 2019-09-16: 15 mL via OROMUCOSAL

## 2019-09-16 MED ORDER — ENOXAPARIN SODIUM 40 MG/0.4ML ~~LOC~~ SOLN: 40.0000 mg | SUBCUTANEOUS | Status: DC

## 2019-09-16 MED ORDER — ONDANSETRON HCL 4 MG PO TABS: 4.0000 mg | ORAL_TABLET | Freq: Four times a day (QID) | ORAL | Status: DC | PRN

## 2019-09-16 MED ORDER — POTASSIUM CHLORIDE 20 MEQ PO PACK
40.0000 meq | PACK | Freq: Once | ORAL | Status: AC
  Administered 2019-09-16: 15:00:00 40 meq via ORAL
  Filled 2019-09-16: qty 2

## 2019-09-16 MED ORDER — ENOXAPARIN SODIUM 40 MG/0.4ML ~~LOC~~ SOLN
SUBCUTANEOUS | Status: AC
  Administered 2019-09-16: 40 mg via SUBCUTANEOUS
  Filled 2019-09-16: qty 0.4

## 2019-09-16 MED ORDER — ADULT MULTIVITAMIN W/MINERALS CH
1.0000 | ORAL_TABLET | Freq: Every day | ORAL | Status: DC
  Administered 2019-09-17 – 2019-09-21 (×5): 1 via ORAL
  Filled 2019-09-16 (×5): qty 1

## 2019-09-16 MED ORDER — POLYETHYLENE GLYCOL 3350 17 G PO PACK
17.0000 g | PACK | Freq: Every day | ORAL | Status: DC
  Administered 2019-09-17 – 2019-09-18 (×2): 17 g via ORAL
  Filled 2019-09-16 (×5): qty 1

## 2019-09-16 MED ORDER — SODIUM CHLORIDE 0.9 % IV SOLN
INTRAVENOUS | Status: DC
  Administered 2019-09-16 – 2019-09-17 (×5): via INTRAVENOUS

## 2019-09-16 MED ORDER — ACETAMINOPHEN 650 MG RE SUPP: 650.0000 mg | Freq: Four times a day (QID) | RECTAL | Status: DC | PRN

## 2019-09-16 MED ORDER — CHLORHEXIDINE GLUCONATE 0.12 % MT SOLN
15.0000 mL | Freq: Two times a day (BID) | OROMUCOSAL | Status: DC
  Administered 2019-09-16 – 2019-09-20 (×9): 15 mL via OROMUCOSAL
  Filled 2019-09-16 (×7): qty 15

## 2019-09-16 MED ORDER — SODIUM CHLORIDE 0.9 % IV SOLN
2.0000 g | INTRAVENOUS | Status: AC
  Administered 2019-09-16 – 2019-09-20 (×5): 2 g via INTRAVENOUS
  Filled 2019-09-16: qty 2
  Filled 2019-09-16 (×2): qty 20
  Filled 2019-09-16 (×2): qty 2

## 2019-09-16 MED ORDER — ENSURE ENLIVE PO LIQD
237.0000 mL | Freq: Two times a day (BID) | ORAL | Status: DC
  Administered 2019-09-16 – 2019-09-18 (×4): 237 mL via ORAL

## 2019-09-16 MED ORDER — SODIUM CHLORIDE 0.9 % IV SOLN
INTRAVENOUS | Status: DC
  Administered 2019-09-16: 05:00:00 via INTRAVENOUS

## 2019-09-16 MED ORDER — SODIUM CHLORIDE 0.9 % IV SOLN
INTRAVENOUS | Status: DC | PRN
  Administered 2019-09-16: 03:00:00 via INTRAVENOUS

## 2019-09-16 MED ORDER — CHLORHEXIDINE GLUCONATE CLOTH 2 % EX PADS
6.0000 | MEDICATED_PAD | Freq: Every day | CUTANEOUS | Status: DC
  Administered 2019-09-16 – 2019-09-20 (×4): 6 via TOPICAL

## 2019-09-16 MED ORDER — ONDANSETRON HCL 4 MG/2ML IJ SOLN
4.0000 mg | Freq: Four times a day (QID) | INTRAMUSCULAR | Status: DC | PRN
  Administered 2019-09-17: 4 mg via INTRAVENOUS
  Filled 2019-09-16: qty 2

## 2019-09-16 NOTE — ED Notes (Signed)
Report called to Lauren RN.

## 2019-09-16 NOTE — ED Notes (Signed)
Pt resting comfortably on stretcher. Appears to be in NAD. Father at bedside.

## 2019-09-16 NOTE — Progress Notes (Signed)
Approved by admin and charge RN to let these two visitors switch out.

## 2019-09-16 NOTE — ED Notes (Signed)
Pt resting quietly.  Stable on bipap.  Dad at bedside

## 2019-09-16 NOTE — H&P (Signed)
History and Physical    Peter Hayes WJX:[id number] DOB: 09-Sep-1996 DOA: 09/15/2019  PCP: Penni Bombard, PA Patient coming from: home  I have personally briefly reviewed patient's old medical records in [place]  Chief Complaint: SOB  HPI: Peter Hayes is a 23 y.o. male with medical history significant of muscular dystrophy, hx of recurrent sigmoid volvulus, IDA, presents with shortness of breath.  Patient is accompanied by his father who assists but patient able to provide hx.  Per the patient he began to feel congested, bringing up mucus over the past couple of days but yesterday he felt he was becoming short of breath.  He utilizes 1 L of oxygen at home and non-invasive ventilation when he sleeps but he was feeling short of breath throughout the day yesterday.  He personally denies any fevers or chills.  He has not had any nausea/vomiting, dysuria or skin rashes.    Patient was in [facility] ER and did spike a fever to 100.4 while there.  He was on BIPAP before transitioning to nasal cannula.   Review of Systems: As per HPI otherwise 10 point review of systems negative.    Past Medical History:  Diagnosis Date  . MD (muscular dystrophy) ([place])   . Refusal of blood product    patient is Peter Hayes witness    Past Surgical History:  Procedure Laterality Date  . EYE SURGERY    . FLEXIBLE SIGMOIDOSCOPY N/A 01/10/2018   Procedure: FLEXIBLE SIGMOIDOSCOPY;  Surgeon: Laurence Spates, MD;  Location: [facility];  Service: Endoscopy;  Laterality: N/A;     reports that he has never smoked. He has never used smokeless tobacco. He reports that he does not drink alcohol or use drugs.  Allergies  Allergen Reactions  . Fentanyl Other (See Comments)    Dizziness    History reviewed. No pertinent family history.   Prior to Admission medications   Medication Sig Start Date End Date Taking? Authorizing Provider  acetaminophen (TYLENOL) 325 MG tablet Take 2 tablets (650 mg total) by  mouth every 6 (six) hours as needed for mild pain, moderate pain, fever or headache (or temp > 100). 01/15/18  Yes Earnstine Regal, PA-C  feeding supplement, ENSURE ENLIVE, (ENSURE ENLIVE) LIQD You can use whatever supplement he likes.  He needs about 1300 calories per day. 1.5 liters of fluid and 50 grams of protein per day. You can buy this at the grocery or drug store.  You do not need a prescription. Patient taking differently: Take 237 mLs by mouth 2 (two) times daily with a meal. You can use whatever supplement he likes.  He needs about 1300 calories per day. 1.5 liters of fluid and 50 grams of protein per day. You can buy this at the grocery or drug store.  You do not need a prescription. 01/15/18  Yes Earnstine Regal, PA-C  ferrous sulfate 220 (44 Fe) MG/5ML solution Take 220 mg by mouth daily.   Yes [provider]  magnesium oxide (MAG-OX) 400 MG tablet Take 400 mg by mouth 3 (three) times daily.   Yes [provider]  metoprolol tartrate (LOPRESSOR) 25 MG tablet Take 25 mg by mouth 3 (three) times daily.   Yes [provider]  polyethylene glycol (MIRALAX / GLYCOLAX) packet Follow package instruction for daily use.  You need to have one soft bowel movement per day.  If he is not doing this call your primary care doctor.  You also need to be sure he  takes in 1.5 liters of fluid per day.   He needs protein supplement daily, and needs to take in about 1300 calories per day. Patient taking differently: Take 17 g by mouth daily. Follow package instruction for daily use.  You need to have one soft bowel movement per day.  If he is not doing this call your primary care doctor.  You also need to be sure he takes in 1.5 liters of fluid per day.   He needs protein supplement daily, and needs to take in about 1300 calories per day. 01/15/18  Yes Earnstine Regal, PA-C  Sennosides (PRUNE SENNA CONCENTRATE PO) Take 8 oz by mouth daily as needed (constipation).   Yes [provider]  simethicone (GAS-X) 80 MG chewable tablet Chew 1 tablet (80 mg total) by mouth every 6 (six) hours as needed for flatulence (distention). 06/03/18  Yes Julianne Rice, MD  Multiple Vitamin (MULTIVITAMIN WITH MINERALS) TABS tablet You can get him a general multivitamin for daily use at any drug store Patient taking differently: Take 1 tablet by mouth daily.  01/15/18   Earnstine Regal, PA-C    Physical Exam: Vitals:   09/16/19 0942 09/16/19 1103 09/16/19 1111 09/16/19 1222  BP:  132/74    Pulse:  (!) 138 (!) 134   Resp:  (!) 27    Temp: 98.6 F (37 C)   97.7 F (36.5 C)  TempSrc: Oral   Oral  SpO2:  100%    Weight:    42.3 kg  Height:    '5\' 2"'$  (1.575 m)     Vitals:   09/16/19 0942 09/16/19 1103 09/16/19 1111 09/16/19 1222  BP:  132/74    Pulse:  (!) 138 (!) 134   Resp:  (!) 27    Temp: 98.6 F (37 C)   97.7 F (36.5 C)  TempSrc: Oral   Oral  SpO2:  100%    Weight:    42.3 kg  Height:    '5\' 2"'$  (1.575 m)   Constitutional: NAD, calm, comfortable, resting with non-invasive PPV Eyes: PERRL, lids and conjunctivae normal ENMT: Mucous membranes are moist. Posterior pharynx clear of any exudate or lesions.Normal dentition.  Neck: normal, supple, no masses, no thyromegaly Respiratory: clear to auscultation bilaterally, no wheezing, no crackles. Normal respiratory effort. No accessory muscle use.  Cardiovascular:  Tachycardic rate and rhythm, no murmurs / rubs / gallops. No extremity edema. 2+ pedal pulses. No carotid bruits.  Abdomen: no tenderness, no masses palpated. No hepatosplenomegaly. Bowel sounds positive.  Musculoskeletal: no clubbing / cyanosis. Ankles inverted Skin: no rashes, lesions, ulcers. No induration Neurologic: CN 2-12 grossly intact. Strength and sensation grossly non-focal Psychiatric: Normal judgment and insight. Alert and oriented x 3. Normal mood.     Labs on Admission: I have personally reviewed following labs and imaging studies   CBC: Recent Labs  Lab 09/15/19 1615 09/15/19 1903 09/16/19 0527  WBC 18.4*  --  7.0  NEUTROABS 14.2*  --   --   HGB 15.4 14.3 9.7*  HCT 49.1 42.0 31.9*  MCV 88.8  --  92.5  PLT 283  --  97*   Basic Metabolic Panel: Recent Labs  Lab 09/15/19 1706 09/15/19 1903 09/16/19 0527  NA 138 138 140  K 4.9 7.5* 2.9*  CL 95*  --  109  CO2 31  --  25  GLUCOSE 193*  --  62*  BUN 14  --  9  CREATININE <0.30*  --  <0.30*  CALCIUM 9.4  --  7.5*   GFR: CrCl cannot be calculated (This lab value cannot be used to calculate CrCl because it is not a number: <0.30). Liver Function Tests: No results for input(s): AST, ALT, ALKPHOS, BILITOT, PROT, ALBUMIN in the last 168 hours. No results for input(s): LIPASE, AMYLASE in the last 168 hours. No results for input(s): AMMONIA in the last 168 hours. Coagulation Profile: No results for input(s): INR, PROTIME in the last 168 hours. Cardiac Enzymes: No results for input(s): CKTOTAL, CKMB, CKMBINDEX, TROPONINI in the last 168 hours. BNP (last 3 results) No results for input(s): PROBNP in the last 8760 hours. HbA1C: No results for input(s): HGBA1C in the last 72 hours. CBG: No results for input(s): GLUCAP in the last 168 hours. Lipid Profile: No results for input(s): CHOL, HDL, LDLCALC, TRIG, CHOLHDL, LDLDIRECT in the last 72 hours. Thyroid Function Tests: No results for input(s): TSH, T4TOTAL, FREET4, T3FREE, THYROIDAB in the last 72 hours. Anemia Panel: No results for input(s): VITAMINB12, FOLATE, FERRITIN, TIBC, IRON, RETICCTPCT in the last 72 hours. Urine analysis:    Component Value Date/Time   COLORURINE YELLOW 09/15/2019 2257   APPEARANCEUR CLOUDY (A) 09/15/2019 2257   LABSPEC 1.020 09/15/2019 2257   PHURINE 7.5 09/15/2019 2257   GLUCOSEU 100 (A) 09/15/2019 2257   HGBUR NEGATIVE 09/15/2019 2257   BILIRUBINUR NEGATIVE 09/15/2019 2257   KETONESUR >80 (A) 09/15/2019 2257   PROTEINUR NEGATIVE 09/15/2019 2257   NITRITE NEGATIVE  09/15/2019 2257   LEUKOCYTESUR NEGATIVE 09/15/2019 2257    Radiological Exams on Admission: Dg Chest Portable 1 View  Result Date: 09/15/2019 CLINICAL DATA:  Cough and shortness of breath for several days, initial encounter EXAM: PORTABLE CHEST 1 VIEW COMPARISON:  08/08/2018 FINDINGS: Cardiac shadows within normal limits. The lungs are hypoinflated without focal confluent infiltrate or effusion. Scattered bowel gas is noted in the upper abdomen. No bony abnormality is seen. IMPRESSION: Poor inspiratory effort without acute abnormality. Electronically Signed   By: Inez Catalina M.D.   On: 09/15/2019 16:04    EKG: Independently reviewed.   Assessment/Plan Peter Hayes is a 23 y.o. male with medical history significant of muscular dystrophy, hx of recurrent sigmoid volvulus, IDA, presents with shortness of breath  # Acute Hypoxic and Hypercapneic Respiratory Failure # Community Acquired Pneumonia - unclear source but with leukocytosis, fever, sob, and true oxygen requirement patient was initiated on Rx for CAP once COVID resulted negative.  I think this is reasonable, though procalcitonin is low (will follow-up tomorrow).  Will continue Rx with Ceftriaxone and Azithromycin with appropriate respiratory support.  D-Dimer is within range. - blood cx not obtained at fever spike yesterday, will add on a respiratory viral panel, strep and legionella Ag  # [place] Muscular Dystrophy - no acute issues  # IDA - Hx of Iron Deficiency and patient is a JW, so would not accept blood products.  Somewhat confusing CBC from this AM at [facility], Hgb is 9.7 but at 7 pm the night before was recorded as 14.3.  He is not having any reported bleeding and no abdominal pain.  He is also thrombocytopenic on the this CBC. - will repeat labs and monitor   DVT prophylaxis: Lovenox Code Status: Full Family Communication: Father at bedside Disposition Plan: pending Admission status: SDU   Truddie Hidden MD [facility] Pager [phone]  If 7PM-7AM, please contact night-coverage [email or web address] Password [facility]  09/16/2019, 12:36 PM

## 2019-09-16 NOTE — ED Notes (Signed)
560mL bag normal saline hung by previous RN- completed at this time.

## 2019-09-16 NOTE — ED Notes (Signed)
EDP made aware of BP.

## 2019-09-16 NOTE — ED Notes (Signed)
Inadequate amount of blood obtained for HIV panel

## 2019-09-16 NOTE — ED Notes (Signed)
Patient awake. Bipap removed. Placed on 6lpm [place] for transport. SpO2 100%. Patient states "I feel better."

## 2019-09-17 DIAGNOSIS — L8990 Pressure ulcer of unspecified site, unspecified stage: Secondary | ICD-10-CM | POA: Insufficient documentation

## 2019-09-17 DIAGNOSIS — D509 Iron deficiency anemia, unspecified: Secondary | ICD-10-CM

## 2019-09-17 DIAGNOSIS — J9602 Acute respiratory failure with hypercapnia: Secondary | ICD-10-CM

## 2019-09-17 DIAGNOSIS — G7101 Duchenne or Becker muscular dystrophy: Secondary | ICD-10-CM

## 2019-09-17 DIAGNOSIS — J189 Pneumonia, unspecified organism: Secondary | ICD-10-CM

## 2019-09-17 LAB — POCT I-STAT EG7
Acid-Base Excess: 7 mmol/L — ABNORMAL HIGH (ref 0.0–2.0)
Bicarbonate: 38.5 mmol/L — ABNORMAL HIGH (ref 20.0–28.0)
Calcium, Ion: 1.03 mmol/L — ABNORMAL LOW (ref 1.15–1.40)
HCT: 42 % (ref 39.0–52.0)
Hemoglobin: 14.3 g/dL (ref 13.0–17.0)
O2 Saturation: 37 %
Patient temperature: 98
Potassium: 7.5 mmol/L (ref 3.5–5.1)
Sodium: 138 mmol/L (ref 135–145)
TCO2: 41 mmol/L — ABNORMAL HIGH (ref 22–32)
pCO2, Ven: 89.6 mmHg (ref 44.0–60.0)
pH, Ven: 7.239 — ABNORMAL LOW (ref 7.250–7.430)
pO2, Ven: 27 mmHg — CL (ref 32.0–45.0)

## 2019-09-17 LAB — BASIC METABOLIC PANEL
Anion gap: 10 (ref 5–15)
BUN: <5 mg/dL — ABNORMAL LOW (ref 6–20)
CO2: 28 mmol/L (ref 22–32)
Calcium: 8.3 mg/dL — ABNORMAL LOW (ref 8.9–10.3)
Chloride: 99 mmol/L (ref 98–111)
Creatinine, Ser: <0.3 mg/dL — ABNORMAL LOW (ref 0.61–1.24)
Glucose, Bld: 122 mg/dL — ABNORMAL HIGH (ref 70–99)
Potassium: 3.9 mmol/L (ref 3.5–5.1)
Sodium: 137 mmol/L (ref 135–145)

## 2019-09-17 LAB — CBC
HCT: 35.9 % — ABNORMAL LOW (ref 39.0–52.0)
Hemoglobin: 10.8 g/dL — ABNORMAL LOW (ref 13.0–17.0)
MCH: 27.7 pg (ref 26.0–34.0)
MCHC: 30.1 g/dL (ref 30.0–36.0)
MCV: 92.1 fL (ref 80.0–100.0)
Platelets: 133 10*3/uL — ABNORMAL LOW (ref 150–400)
RBC: 3.9 MIL/uL — ABNORMAL LOW (ref 4.22–5.81)
RDW: 14.2 % (ref 11.5–15.5)
WBC: 8.9 10*3/uL (ref 4.0–10.5)
nRBC: 0 % (ref 0.0–0.2)

## 2019-09-17 LAB — LACTIC ACID, PLASMA: Lactic Acid, Venous: 0.6 mmol/L (ref 0.5–1.9)

## 2019-09-17 MED ORDER — ENOXAPARIN SODIUM 30 MG/0.3ML ~~LOC~~ SOLN
30.0000 mg | SUBCUTANEOUS | Status: DC
  Administered 2019-09-17 – 2019-09-20 (×4): 30 mg via SUBCUTANEOUS
  Filled 2019-09-17 (×5): qty 0.3

## 2019-09-17 MED ORDER — SODIUM CHLORIDE 0.9 % IV BOLUS
250.0000 mL | Freq: Once | INTRAVENOUS | Status: AC
  Administered 2019-09-17: 250 mL via INTRAVENOUS

## 2019-09-17 MED ORDER — PRO-STAT SUGAR FREE PO LIQD
30.0000 mL | Freq: Every day | ORAL | Status: DC
  Administered 2019-09-17: 30 mL via ORAL
  Filled 2019-09-17 (×2): qty 30

## 2019-09-17 MED ORDER — JUVEN PO PACK
1.0000 | PACK | Freq: Two times a day (BID) | ORAL | Status: DC
  Administered 2019-09-17 – 2019-09-21 (×8): 1 via ORAL
  Filled 2019-09-17 (×8): qty 1

## 2019-09-17 MED ORDER — SODIUM CHLORIDE 0.9 % IV BOLUS
500.0000 mL | Freq: Once | INTRAVENOUS | Status: AC
  Administered 2019-09-17: 500 mL via INTRAVENOUS

## 2019-09-17 MED ORDER — AZITHROMYCIN 250 MG PO TABS
500.0000 mg | ORAL_TABLET | Freq: Every day | ORAL | Status: AC
  Administered 2019-09-18 – 2019-09-20 (×3): 500 mg via ORAL
  Filled 2019-09-17 (×3): qty 2

## 2019-09-17 NOTE — Progress Notes (Signed)
PT Cancellation Note  Patient Details Name: Peter Hayes MRN: [id number] DOB: 08/12/1996   Cancelled Treatment:     PT order received but eval deferred - pt with low BP.  Will follow.   Peter Hayes 09/17/2019, 10:44 AM

## 2019-09-17 NOTE — Progress Notes (Signed)
OT Cancellation Note  Patient Details Name: Peter Hayes MRN: [id number] DOB: 11-09-1995   Cancelled Treatment:    Reason Eval/Treat Not Completed: Medical issues which prohibited therapy.  Pt with low BP  Shaquisha Wynn 09/17/2019, 9:24 AM  Karsten Ro, OTR/L Acute Rehabilitation Services 09/17/2019

## 2019-09-17 NOTE — Progress Notes (Addendum)
PROGRESS NOTE    Peter Hayes    Code Status: Full Code  XMI:[id number] DOB: May 28, 1996 DOA: 09/15/2019  PCP: Penni Bombard, [facility] Summary  This is a 23 year old male with past medical history of Duchenne muscular dystrophy, recurrent sigmoid volvulus, chronic hypoxic respiratory failure on 1 L oxygen daily and noninvasive ventilation nightly iron deficiency anemia who presented with shortness of breath.  Patient began to feel congested and bringing up mucus over the past several days but on 12/8 began to be more short of breath.  He was in [facility] ER and spiked a fever to 100.4 and was on BiPAP before transitioning to nasal cannula and transferred to [facility] 12/9.  Covid negative.  Started on treatment for CAP.  Viral panel negative.  A & P   Active Problems:   Acute respiratory failure with hypoxia and hypercapnia (HCC)   Severe sepsis (HCC)   Pressure injury of skin    Acute on chronic hypoxic and hypercapnic respiratory failure secondary to community-acquired pneumonia afebrile, tachycardic, tachypneic with intermittent hypotension.  BP improved to 116/61.  No leukocytosis.  Strep pneumo urine antigen and viral panel negative.  Has received 24 hours NS infusion at 125 cc/hr. day 2 ceftriaxone/azithromycin.  Chest x-ray with poor inspiratory effort and without acute abnormality on 12/8.  Typically on 1 L nasal cannula at baseline and noninvasive ventilation nightly at home -Continue CAP therapy -Repeat chest x-ray in a.m. now that the patient is hydrated to check for consolidation -Discontinue IV fluids -Continue BiPAP -Sputum culture  Severe Sepsis secondary to above, present on admission, resolved  Duchenne muscular dystrophy Stable  Iron deficiency anemia patient is a Jehov[employer]'s Witness.  Hemoglobin 11.6-> 10.8 -Continue to monitor  Tachycardia takes metoprolol outpatient -Continue with hold parameters and monitor for hypotension  Underweight -RD on board.   Appreciate dietary recommendations    DVT prophylaxis: Lovenox Family Communication: Patient's father has been updated at bedside Disposition Plan: Pending clinical stability  Consultants  None  Procedures  None  Antibiotics   Anti-infectives (From admission, onward)    Start     Dose/Rate Route Frequency Ordered Stop   09/18/19 1000  azithromycin (ZITHROMAX) tablet 500 mg     500 mg Oral Daily 09/17/19 1122 09/21/19 0959   09/16/19 0500  cefTRIAXone (ROCEPHIN) 2 g in sodium chloride 0.9 % 100 mL IVPB     2 g 200 mL/hr over 30 Minutes Intravenous Every 24 hours 09/16/19 0458 09/21/19 0459   09/16/19 0500  azithromycin (ZITHROMAX) 500 mg in sodium chloride 0.9 % 250 mL IVPB  Status:  Discontinued     500 mg 250 mL/hr over 60 Minutes Intravenous Every 24 hours 09/16/19 0458 09/17/19 1122            Subjective   Patient evaluated in the stepdown unit.  Father at bedside provided most of history.  Agree to above findings.  States that he is feeling somewhat better but not back to normal.  He denied any significant cough.  Father states he does not typically get pneumonia.  Objective   Vitals:   09/17/19 1118 09/17/19 1200 09/17/19 1300 09/17/19 1400  BP:  (!) 90/38 113/64 116/61  Pulse: (!) 103 98 (!) 124 (!) 131  Resp: 12 (!) 27 (!) 30 (!) 27  Temp:  98.6 F (37 C)    TempSrc:  Axillary    SpO2: 96% 96% 96% 100%  Weight:      Height:  Intake/Output Summary (Last 24 hours) at 09/17/2019 1555 Last data filed at 09/17/2019 1411 Gross per 24 hour  Intake 3369.44 ml  Output 1325 ml  Net 2044.44 ml   Filed Weights   09/15/19 1543 09/16/19 1222  Weight: 48.8 kg 42.3 kg    Examination:  Physical Exam Vitals and nursing note reviewed. Exam conducted with a chaperone present.  Constitutional:      Comments: Appears chronically ill Underweight  Eyes:     Extraocular Movements: Extraocular movements intact.  Cardiovascular:     Rate and Rhythm:  Regular rhythm. Tachycardia present.  Pulmonary:     Comments: Difficult to auscultate given mask and patient's inability to sit up right.  No wheeze auscultated BiPAP in place Abdominal:     General: Abdomen is flat.     Palpations: Abdomen is soft.  Musculoskeletal:     Comments: Chronic bilateral upper and lower extremity deformities in setting of Duchenne muscular dystrophy  Neurological:     Mental Status: He is alert. Mental status is at baseline.  Psychiatric:        Mood and Affect: Mood normal.        Behavior: Behavior normal.     Data Reviewed: I have personally reviewed following labs and imaging studies  CBC: Recent Labs  Lab 09/15/19 1615 09/15/19 1903 09/16/19 0527 09/16/19 1347 09/17/19 0239  WBC 18.4*  --  7.0 6.6 8.9  NEUTROABS 14.2*  --   --  4.6  --   HGB 15.4 14.3 9.7* 11.6* 10.8*  HCT 49.1 42.0 31.9* 38.5* 35.9*  MCV 88.8  --  92.5 92.5 92.1  PLT 283  --  97* 113* 222*   Basic Metabolic Panel: Recent Labs  Lab 09/15/19 1706 09/15/19 1903 09/16/19 0527 09/16/19 1347 09/17/19 0239  NA 138 138 140 143 137  K 4.9 7.5* 2.9* 3.3* 3.9  CL 95*  --  109 104 99  CO2 31  --  '25 29 28  ' GLUCOSE 193*  --  62* 69* 122*  BUN 14  --  9 7 <5*  CREATININE <0.30*  --  <0.30* <0.30* <0.30*  CALCIUM 9.4  --  7.5* 8.2* 8.3*   GFR: CrCl cannot be calculated (This lab value cannot be used to calculate CrCl because it is not a number: <0.30). Liver Function Tests: No results for input(s): AST, ALT, ALKPHOS, BILITOT, PROT, ALBUMIN in the last 168 hours. No results for input(s): LIPASE, AMYLASE in the last 168 hours. No results for input(s): AMMONIA in the last 168 hours. Coagulation Profile: No results for input(s): INR, PROTIME in the last 168 hours. Cardiac Enzymes: No results for input(s): CKTOTAL, CKMB, CKMBINDEX, TROPONINI in the last 168 hours. BNP (last 3 results) No results for input(s): PROBNP in the last 8760 hours. HbA1C: No results for input(s):  HGBA1C in the last 72 hours. CBG: No results for input(s): GLUCAP in the last 168 hours. Lipid Profile: No results for input(s): CHOL, HDL, LDLCALC, TRIG, CHOLHDL, LDLDIRECT in the last 72 hours. Thyroid Function Tests: No results for input(s): TSH, T4TOTAL, FREET4, T3FREE, THYROIDAB in the last 72 hours. Anemia Panel: No results for input(s): VITAMINB12, FOLATE, FERRITIN, TIBC, IRON, RETICCTPCT in the last 72 hours. Sepsis Labs: Recent Labs  Lab 09/16/19 0115 09/16/19 0345 09/16/19 0527 09/17/19 0524  PROCALCITON  --   --  0.18  --   LATICACIDVEN 0.5 0.4*  --  0.6    Recent Results (from the past 240 hour(s))  SARS  Coronavirus 2 Ag (30 min TAT) - Nasal Swab (BD Veritor Kit)     Status: None   Collection Time: 09/15/19  4:15 PM   Specimen: Nasal Swab (BD Veritor Kit)  Result Value Ref Range Status   SARS Coronavirus 2 Ag NEGATIVE NEGATIVE Final    Comment: (NOTE) SARS-CoV-2 antigen NOT DETECTED.  Negative results are presumptive.  Negative results do not preclude SARS-CoV-2 infection and should not be used as the sole basis for treatment or other patient management decisions, including infection  control decisions, particularly in the presence of clinical signs and  symptoms consistent with COVID-19, or in those who have been in contact with the virus.  Negative results must be combined with clinical observations, patient history, and epidemiological information. The expected result is Negative. Fact Sheet for Patients: PodPark.tn Fact Sheet for Healthcare Providers: GiftContent.is This test is not yet approved or cleared by the [place] FDA and  has been authorized for detection and/or diagnosis of SARS-CoV-2 by FDA under an Emergency Use Authorization (EUA).  This EUA will remain in effect (meaning this test can be used) for the duration of  the COVID-19 de claration under Section 564(b)(1) of the Act,  21 U.S.C. section 360bbb-3(b)(1), unless the authorization is terminated or revoked sooner. Performed at [facility], [address] [place], [place] [address]   SARS CORONAVIRUS 2 (TAT 6-24 HRS) Nasopharyngeal Nasopharyngeal Swab     Status: None   Collection Time: 09/15/19  7:35 PM   Specimen: Nasopharyngeal Swab  Result Value Ref Range Status   SARS Coronavirus 2 NEGATIVE NEGATIVE Final    Comment: (NOTE) SARS-CoV-2 target nucleic acids are NOT DETECTED. The SARS-CoV-2 RNA is generally detectable in upper and lower respiratory specimens during the acute phase of infection. Negative results do not preclude SARS-CoV-2 infection, do not rule out co-infections with other pathogens, and should not be used as the sole basis for treatment or other patient management decisions. Negative results must be combined with clinical observations, patient history, and epidemiological information. The expected result is Negative. Fact Sheet for Patients: SugarRoll.be Fact Sheet for Healthcare Providers: https://www.woods-mathews.com/ This test is not yet approved or cleared by the [place] FDA and  has been authorized for detection and/or diagnosis of SARS-CoV-2 by FDA under an Emergency Use Authorization (EUA). This EUA will remain  in effect (meaning this test can be used) for the duration of the COVID-19 declaration under Section 56 4(b)(1) of the Act, 21 U.S.C. section 360bbb-3(b)(1), unless the authorization is terminated or revoked sooner. Performed at [facility], [place] [address] [place], [place] [address]   Respiratory Panel by RT PCR (Flu A&B, Covid) - Nasopharyngeal Swab     Status: None   Collection Time: 09/15/19 10:13 PM   Specimen: Nasopharyngeal Swab  Result Value Ref Range Status   SARS Coronavirus 2 by RT PCR NEGATIVE NEGATIVE Final    Comment: (NOTE) SARS-CoV-2 target nucleic acids are NOT DETECTED. The  SARS-CoV-2 RNA is generally detectable in upper respiratoy specimens during the acute phase of infection. The lowest concentration of SARS-CoV-2 viral copies this assay can detect is 131 copies/mL. A negative result does not preclude SARS-Cov-2 infection and should not be used as the sole basis for treatment or other patient management decisions. A negative result may occur with  improper specimen collection/handling, submission of specimen other than nasopharyngeal swab, presence of viral mutation(s) within the areas targeted by this assay, and inadequate number of viral copies (<  131 copies/mL). A negative result must be combined with clinical observations, patient history, and epidemiological information. The expected result is Negative. Fact Sheet for Patients:  PinkCheek.be Fact Sheet for Healthcare Providers:  GravelBags.it This test is not yet ap proved or cleared by the [place] FDA and  has been authorized for detection and/or diagnosis of SARS-CoV-2 by FDA under an Emergency Use Authorization (EUA). This EUA will remain  in effect (meaning this test can be used) for the duration of the COVID-19 declaration under Section 564(b)(1) of the Act, 21 U.S.C. section 360bbb-3(b)(1), unless the authorization is terminated or revoked sooner.    Influenza A by PCR NEGATIVE NEGATIVE Final   Influenza B by PCR NEGATIVE NEGATIVE Final    Comment: (NOTE) The Xpert Xpress SARS-CoV-2/FLU/RSV assay is intended as an aid in  the diagnosis of influenza from Nasopharyngeal swab specimens and  should not be used as a sole basis for treatment. Nasal washings and  aspirates are unacceptable for Xpert Xpress SARS-CoV-2/FLU/RSV  testing. Fact Sheet for Patients: PinkCheek.be Fact Sheet for Healthcare Providers: GravelBags.it This test is not yet approved or cleared by the [place] FDA and  has been authorized for detection and/or diagnosis of SARS-CoV-2 by  FDA under an Emergency Use Authorization (EUA). This EUA will remain  in effect (meaning this test can be used) for the duration of the  Covid-19 declaration under Section 564(b)(1) of the Act, 21  U.S.C. section 360bbb-3(b)(1), unless the authorization is  terminated or revoked. Performed at [facility], [place] [address] [place], [place] [address]   MRSA PCR Screening     Status: None   Collection Time: 09/16/19 11:03 AM   Specimen: Nasal Mucosa; Nasopharyngeal  Result Value Ref Range Status   MRSA by PCR NEGATIVE NEGATIVE Final    Comment:        The GeneXpert MRSA Assay (FDA approved for NASAL specimens only), is one component of a comprehensive MRSA colonization surveillance program. It is not intended to diagnose MRSA infection nor to guide or monitor treatment for MRSA infections. Performed at [facility], [place] [address] [place], [place] [address]   Respiratory Panel by PCR     Status: None   Collection Time: 09/16/19 12:43 PM   Specimen: Nasopharyngeal Swab; Respiratory  Result Value Ref Range Status   Adenovirus NOT DETECTED NOT DETECTED Final   Coronavirus 229E NOT DETECTED NOT DETECTED Final    Comment: (NOTE) The Coronavirus on the Respiratory Panel, DOES NOT test for the novel  Coronavirus (2019 nCoV)    Coronavirus HKU1 NOT DETECTED NOT DETECTED Final   Coronavirus NL63 NOT DETECTED NOT DETECTED Final   Coronavirus OC43 NOT DETECTED NOT DETECTED Final   Metapneumovirus NOT DETECTED NOT DETECTED Final   Rhinovirus / Enterovirus NOT DETECTED NOT DETECTED Final   Influenza A NOT DETECTED NOT DETECTED Final   Influenza B NOT DETECTED NOT DETECTED Final   Parainfluenza Virus 1 NOT DETECTED NOT DETECTED Final   Parainfluenza Virus 2 NOT DETECTED NOT DETECTED Final   Parainfluenza Virus 3 NOT DETECTED NOT DETECTED Final   Parainfluenza Virus 4 NOT  DETECTED NOT DETECTED Final   Respiratory Syncytial Virus NOT DETECTED NOT DETECTED Final   Bordetella pertussis NOT DETECTED NOT DETECTED Final   Chlamydophila pneumoniae NOT DETECTED NOT DETECTED Final   Mycoplasma pneumoniae NOT DETECTED NOT DETECTED Final    Comment: Performed at [facility], [place]. [address] [place],  [address]  Radiology Studies: DG Chest Portable 1 View  Result Date: 09/15/2019 CLINICAL DATA:  Cough and shortness of breath for several days, initial encounter EXAM: PORTABLE CHEST 1 VIEW COMPARISON:  08/08/2018 FINDINGS: Cardiac shadows within normal limits. The lungs are hypoinflated without focal confluent infiltrate or effusion. Scattered bowel gas is noted in the upper abdomen. No bony abnormality is seen. IMPRESSION: Poor inspiratory effort without acute abnormality. Electronically Signed   By: Inez Catalina M.D.   On: 09/15/2019 16:04        Scheduled Meds:  [START ON 09/18/2019] azithromycin  500 mg Oral Daily   chlorhexidine  15 mL Mouth Rinse BID   Chlorhexidine Gluconate Cloth  6 each Topical Daily   enoxaparin (LOVENOX) injection  30 mg Subcutaneous Q24H   feeding supplement (ENSURE ENLIVE)  237 mL Oral BID WC   feeding supplement (PRO-STAT SUGAR FREE 64)  30 mL Oral Daily   magnesium oxide  400 mg Oral TID   mouth rinse  15 mL Mouth Rinse q12n4p   metoprolol tartrate  25 mg Oral TID   multivitamin with minerals  1 tablet Oral Daily   nutrition supplement (JUVEN)  1 packet Oral BID BM   polyethylene glycol  17 g Oral Daily   Continuous Infusions:  sodium chloride Stopped (09/16/19 0500)   sodium chloride 50 mL/hr at 09/16/19 0500   sodium chloride 125 mL/hr at 09/17/19 1411   cefTRIAXone (ROCEPHIN)  IV Stopped (09/17/19 0448)     LOS: 2 days    Time spent: 25 minutes with over 50% of the time coordinating the patient's care    Harold Hedge, DO [facility] Pager [phone]  If 7PM-7AM, please contact  night-coverage [email or web address] Password [email or web address] 09/17/2019, 3:55 PM

## 2019-09-17 NOTE — Progress Notes (Signed)
Initial Nutrition Assessment  DOCUMENTATION CODES:   Underweight  INTERVENTION:  - continue Ensure Enlive BID, each supplement provides 350 kcal and 20 grams of protein. - will order 30 mL Prostat once/day, each supplement provides 100 kcal and 15 grams of protein. - will order Magic Cup BID with meals, each supplement provides 290 kcal and 9 grams of protein. - will order Juven BID, each packet provides 95 calories, 2.5 grams of protein (collagen), and 9.8 grams of carbohydrate (3 grams sugar); also contains 7 grams of L-arginine and L-glutamine, 300 mg vitamin C, 15 mg vitamin E, 1.2 mcg vitamin B-12, 9.5 mg zinc, 200 mg calcium, and 1.5 g  Calcium Beta-hydroxy-Beta-methylbutyrate to support wound healing.    NUTRITION DIAGNOSIS:   Increased nutrient needs related to acute illness as evidenced by estimated needs.  GOAL:   Patient will meet greater than or equal to 90% of their needs  MONITOR:   PO intake, Supplement acceptance, Labs, Weight trends, Skin  REASON FOR ASSESSMENT:   Consult Malnutrition Eval  ASSESSMENT:   23 y.o. male with medical history significant of muscular dystrophy, hx of recurrent sigmoid volvulus, and iron deficiency anemia. He presented to the ED due to SOB. He reported feeling congested, bringing up mucus for several days and then experiencing worsening SOB. At home, he uses 1L O2 and non-invasive ventilation when sleeping. He denied N/V. In the [facility] ED he had a fever of 100.4 degrees F and required BiPAP.  No intakes documented since admission. Ensure ordered BID and patient accepted the 1 bottle offered to him so far. Flow sheet documentation indicates that patient is a/o x4, but he was sleeping and on BiPAP at the time of RD visit. Dad was at bedside and provided all information.   This RD saw patient in 01/2018 and reviewed note from that interaction. 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 He has no chewing difficulty but does have some difficulty with swallowing "hard" things (suspect that chewing may also be inadequate with these items).   He was previously taking/receiving selenium and CoQ10 supplements, but has not received these since before his abdominal surgeries. Dad has noticed functional decline since surgeries. After discussing, do not feel that these supplements need to be re-started during hospitalization but would recommend looking to re-start them outpatient. Patient needs medications to be liquid or crushed and administered in applesauce.   Per chart review, current weight is 93 lb and PTA the most recently documented weight was on 11/27/18 at [facility] when he weighed 100 lb. This indicates 7 lb weight loss (7% body weight) in the past ~10 months; not significant for time frame.  NFPE not appropriate for this patient due to chronic muscle-wasting disease (muscular dystrophy). He has contractures and requires assistance with all ADLs, per dad's report.   Labs reviewed; BUN: <5 mg/dl, creatinine: <0.3 mg/dl, Ca: 8.3 mg/dl. Medications reviewed; 400 mg mag-ox TID, daily multivitamin with minerals, 1 packet miralax/day, 40 mEq Klor-Con x1 dose 12/9. IVF; NS @ 125 ml/hr.      NUTRITION - FOCUSED PHYSICAL EXAM:  did not complete as patient has hx of muscular dystropy.  Diet Order:   Diet Order            Diet regular Room service appropriate? Yes; Fluid consistency: Thin  Diet  effective now              EDUCATION NEEDS:   No education needs have been identified at this time  Skin:  Skin Assessment: Skin Integrity Issues: Skin Integrity Issues:: Stage II Stage II: upper nose  Last BM:  PTA/unknown  Height:   Ht Readings from Last 1 Encounters:  09/16/19 $RemoveB'5\' 2"'[id number]$  (1.575 m)    Weight:   Wt Readings from Last 1 Encounters:   09/16/19 42.3 kg    Ideal Body Weight:  53.6 kg  BMI:  Body mass index is 17.06 kg/m.  Estimated Nutritional Needs:   Kcal:  1300-1500 kcal  Protein:  65-80 grams  Fluid:  >/= 1.5 L/day      Jarome Matin, MS, RD, LDN, [facility] Inpatient Clinical Dietitian Pager # [phone] After hours/weekend pager # [phone]

## 2019-09-17 NOTE — Progress Notes (Signed)
PHARMACIST - PHYSICIAN COMMUNICATION  CONCERNING: Antibiotic IV to Oral Route Change Policy  RECOMMENDATION: This patient is receiving azithromycin by the intravenous route.  Based on criteria approved by the Pharmacy and Therapeutics Committee, the antibiotic(s) is/are being converted to the equivalent oral dose form(s).   DESCRIPTION: These criteria include:  Patient being treated for a respiratory tract infection, urinary tract infection, cellulitis or clostridium difficile associated diarrhea if on metronidazole  The patient is not neutropenic and does not exhibit a GI malabsorption state  The patient is eating (either orally or via tube) and/or has been taking other orally administered medications for a least 24 hours  The patient is improving clinically and has a Tmax < 100.5  If you have questions about this conversion, please contact the Pharmacy Department  []  ( [phone] )  [place] []  ( [phone] )  [facility] []  ( [phone] )  [place] []  ( [phone] )  [facility] [x]  ( [phone] )  [facility]  

## 2019-09-18 ENCOUNTER — Inpatient Hospital Stay (HOSPITAL_COMMUNITY): Payer: Managed Care, Other (non HMO)

## 2019-09-18 LAB — BASIC METABOLIC PANEL
Anion gap: 11 (ref 5–15)
BUN: <5 mg/dL — ABNORMAL LOW (ref 6–20)
CO2: 30 mmol/L (ref 22–32)
Calcium: 8.3 mg/dL — ABNORMAL LOW (ref 8.9–10.3)
Chloride: 99 mmol/L (ref 98–111)
Creatinine, Ser: <0.3 mg/dL — ABNORMAL LOW (ref 0.61–1.24)
Glucose, Bld: 94 mg/dL (ref 70–99)
Potassium: 3.2 mmol/L — ABNORMAL LOW (ref 3.5–5.1)
Sodium: 140 mmol/L (ref 135–145)

## 2019-09-18 LAB — MAGNESIUM: Magnesium: 1.8 mg/dL (ref 1.7–2.4)

## 2019-09-18 LAB — CBC
HCT: 38.1 % — ABNORMAL LOW (ref 39.0–52.0)
Hemoglobin: 11.5 g/dL — ABNORMAL LOW (ref 13.0–17.0)
MCH: 28.2 pg (ref 26.0–34.0)
MCHC: 30.2 g/dL (ref 30.0–36.0)
MCV: 93.4 fL (ref 80.0–100.0)
Platelets: 102 10*3/uL — ABNORMAL LOW (ref 150–400)
RBC: 4.08 MIL/uL — ABNORMAL LOW (ref 4.22–5.81)
RDW: 14.1 % (ref 11.5–15.5)
WBC: 6 10*3/uL (ref 4.0–10.5)
nRBC: 0 % (ref 0.0–0.2)

## 2019-09-18 LAB — LEGIONELLA PNEUMOPHILA SEROGP 1 UR AG: L. pneumophila Serogp 1 Ur Ag: NEGATIVE

## 2019-09-18 MED ORDER — SODIUM CHLORIDE 0.9 % IV BOLUS
1000.0000 mL | Freq: Once | INTRAVENOUS | Status: AC
  Administered 2019-09-18: 1000 mL via INTRAVENOUS

## 2019-09-18 MED ORDER — ALBUTEROL SULFATE (2.5 MG/3ML) 0.083% IN NEBU
2.5000 mg | INHALATION_SOLUTION | Freq: Once | RESPIRATORY_TRACT | Status: AC
  Administered 2019-09-18: 2.5 mg via RESPIRATORY_TRACT
  Filled 2019-09-18: qty 3

## 2019-09-18 MED ORDER — MAGNESIUM SULFATE 2 GM/50ML IV SOLN
2.0000 g | Freq: Once | INTRAVENOUS | Status: AC
  Administered 2019-09-18: 2 g via INTRAVENOUS
  Filled 2019-09-18: qty 50

## 2019-09-18 MED ORDER — POTASSIUM CHLORIDE 10 MEQ/100ML IV SOLN
10.0000 meq | INTRAVENOUS | Status: AC
  Administered 2019-09-18 (×2): 10 meq via INTRAVENOUS
  Filled 2019-09-18 (×2): qty 100

## 2019-09-18 MED ORDER — IPRATROPIUM-ALBUTEROL 0.5-2.5 (3) MG/3ML IN SOLN
3.0000 mL | RESPIRATORY_TRACT | Status: DC | PRN
  Administered 2019-09-19 – 2019-09-20 (×2): 3 mL via RESPIRATORY_TRACT
  Filled 2019-09-18 (×2): qty 3

## 2019-09-18 NOTE — Progress Notes (Addendum)
PROGRESS NOTE    Peter Hayes    Code Status: Full Code  PHX:[id number] DOB: 26-Dec-1995 DOA: 09/15/2019  PCP: Penni Bombard, [facility] Summary  This is a 23 year old male with past medical history of Duchenne muscular dystrophy, recurrent sigmoid volvulus, chronic hypoxic respiratory failure on 1 L oxygen daily and noninvasive ventilation nightly iron deficiency anemia who presented with shortness of breath.  Patient began to feel congested and bringing up mucus over the past several days but on 12/8 began to be more short of breath.  He was in [facility] ER and spiked a fever to 100.4 and was on BiPAP before transitioning to nasal cannula and transferred to [facility] 12/9.  Covid negative.  Started on treatment for CAP.  Viral panel negative.  A & P   Active Problems:   Acute respiratory failure with hypoxia and hypercapnia (HCC)   Severe sepsis (HCC)   Pressure injury of skin   Acute on chronic hypoxic and hypercapnic respiratory failure secondary to suspected community-acquired pneumonia afebrile today, tachycardic, tachypneic with intermittent hypotension.  BP improved. No leukocytosis.  Strep pneumo urine antigen and viral panel negative. day 3 ceftriaxone/azithromycin.  Chest x-ray with poor inspiratory effort and without acute abnormality on 12/8.  B chest x-ray today unremarkable.  Typically on 1 L nasal cannula at baseline and noninvasive ventilation nightly at home, tolerating BiPAP nightly and 4 L nasal cannula during the day currently.  Wheeze today and ordered albuterol treatment -Continue CAP therapy -Duoneb every 4 hours as needed -Discontinue IV fluids -Continue BiPAP -Sputum culture  Nausea without vomiting -Zofran as needed  Hypokalemia repleated  Duchenne muscular dystrophy Stable  Iron deficiency anemia patient is a Jehov[employer]'s Witness.  Hemoglobin 11.6-> 10.8->11.5 -Continue to monitor  Tachycardia takes metoprolol outpatient -Continue with hold  parameters and monitor for hypotension  Underweight -RD on board.  Appreciate dietary recommendations    DVT prophylaxis: Lovenox Family Communication: Patient's father has been updated at bedside Disposition Plan: Pending clinical stability  Consultants  None  Procedures  None  Antibiotics   Anti-infectives (From admission, onward)   Start     Dose/Rate Route Frequency Ordered Stop   09/18/19 1000  azithromycin (ZITHROMAX) tablet 500 mg     500 mg Oral Daily 09/17/19 1122 09/21/19 0959   09/16/19 0500  cefTRIAXone (ROCEPHIN) 2 g in sodium chloride 0.9 % 100 mL IVPB     2 g 200 mL/hr over 30 Minutes Intravenous Every 24 hours 09/16/19 0458 09/21/19 0459   09/16/19 0500  azithromycin (ZITHROMAX) 500 mg in sodium chloride 0.9 % 250 mL IVPB  Status:  Discontinued     500 mg 250 mL/hr over 60 Minutes Intravenous Every 24 hours 09/16/19 0458 09/17/19 1122           Subjective   Patient seen at bedside.  Denied any complaints and stated he started to feel better today.  Dad at bedside states that patient had some nausea which improved with Zofran yesterday.  No other complaints  Objective   Vitals:   09/18/19 0953 09/18/19 1000 09/18/19 1135 09/18/19 1140  BP:  (!) 87/45 (!) 84/46   Pulse:  92 98 91  Resp:  '11 16 11  ' Temp:    97.6 F (36.4 C)  TempSrc:    Axillary  SpO2: 97% 98% 98% 100%  Weight:      Height:        Intake/Output Summary (Last 24 hours) at 09/18/2019 1346  Last data filed at 09/18/2019 1100 Gross per 24 hour  Intake 1633.76 ml  Output 400 ml  Net 1233.76 ml   Filed Weights   09/15/19 1543 09/16/19 1222  Weight: 48.8 kg 42.3 kg    Examination:  Physical Exam Vitals and nursing note reviewed.  Constitutional:      Comments: Appears chronically ill  HENT:     Head: Normocephalic and atraumatic.     Mouth/Throat:     Mouth: Mucous membranes are moist.  Eyes:     Extraocular Movements: Extraocular movements intact.  Cardiovascular:       Rate and Rhythm: Regular rhythm. Tachycardia present.  Pulmonary:     Effort: No respiratory distress.     Comments: Diffuse end expiratory wheeze Abdominal:     General: Abdomen is flat.     Palpations: Abdomen is soft.  Musculoskeletal:        General: No swelling or tenderness.     Cervical back: Normal range of motion. No rigidity.  Neurological:     General: No focal deficit present.     Mental Status: He is alert. Mental status is at baseline.  Psychiatric:        Mood and Affect: Mood normal.        Behavior: Behavior normal.     Data Reviewed: I have personally reviewed following labs and imaging studies  CBC: Recent Labs  Lab 09/15/19 1615 09/15/19 1903 09/16/19 0527 09/16/19 1347 09/17/19 0239 09/18/19 0241  WBC 18.4*  --  7.0 6.6 8.9 6.0  NEUTROABS 14.2*  --   --  4.6  --   --   HGB 15.4 14.3 9.7* 11.6* 10.8* 11.5*  HCT 49.1 42.0 31.9* 38.5* 35.9* 38.1*  MCV 88.8  --  92.5 92.5 92.1 93.4  PLT 283  --  97* 113* 133* 892*   Basic Metabolic Panel: Recent Labs  Lab 09/15/19 1706 09/15/19 1903 09/16/19 0527 09/16/19 1347 09/17/19 0239 09/18/19 0241  NA 138 138 140 143 137 140  K 4.9 7.5* 2.9* 3.3* 3.9 3.2*  CL 95*  --  109 104 99 99  CO2 31  --  '25 29 28 30  ' GLUCOSE 193*  --  62* 69* 122* 94  BUN 14  --  9 7 <5* <5*  CREATININE <0.30*  --  <0.30* <0.30* <0.30* <0.30*  CALCIUM 9.4  --  7.5* 8.2* 8.3* 8.3*  MG  --   --   --   --   --  1.8   GFR: CrCl cannot be calculated (This lab value cannot be used to calculate CrCl because it is not a number: <0.30). Liver Function Tests: No results for input(s): AST, ALT, ALKPHOS, BILITOT, PROT, ALBUMIN in the last 168 hours. No results for input(s): LIPASE, AMYLASE in the last 168 hours. No results for input(s): AMMONIA in the last 168 hours. Coagulation Profile: No results for input(s): INR, PROTIME in the last 168 hours. Cardiac Enzymes: No results for input(s): CKTOTAL, CKMB, CKMBINDEX, TROPONINI in  the last 168 hours. BNP (last 3 results) No results for input(s): PROBNP in the last 8760 hours. HbA1C: No results for input(s): HGBA1C in the last 72 hours. CBG: No results for input(s): GLUCAP in the last 168 hours. Lipid Profile: No results for input(s): CHOL, HDL, LDLCALC, TRIG, CHOLHDL, LDLDIRECT in the last 72 hours. Thyroid Function Tests: No results for input(s): TSH, T4TOTAL, FREET4, T3FREE, THYROIDAB in the last 72 hours. Anemia Panel: No results for input(s): VITAMINB12,  FOLATE, FERRITIN, TIBC, IRON, RETICCTPCT in the last 72 hours. Sepsis Labs: Recent Labs  Lab 09/16/19 0115 09/16/19 0345 09/16/19 0527 09/17/19 0524  PROCALCITON  --   --  0.18  --   LATICACIDVEN 0.5 0.4*  --  0.6    Recent Results (from the past 240 hour(s))  SARS Coronavirus 2 Ag (30 min TAT) - Nasal Swab (BD Veritor Kit)     Status: None   Collection Time: 09/15/19  4:15 PM   Specimen: Nasal Swab (BD Veritor Kit)  Result Value Ref Range Status   SARS Coronavirus 2 Ag NEGATIVE NEGATIVE Final    Comment: (NOTE) SARS-CoV-2 antigen NOT DETECTED.  Negative results are presumptive.  Negative results do not preclude SARS-CoV-2 infection and should not be used as the sole basis for treatment or other patient management decisions, including infection  control decisions, particularly in the presence of clinical signs and  symptoms consistent with COVID-19, or in those who have been in contact with the virus.  Negative results must be combined with clinical observations, patient history, and epidemiological information. The expected result is Negative. Fact Sheet for Patients: PodPark.tn Fact Sheet for Healthcare Providers: GiftContent.is This test is not yet approved or cleared by the [place] FDA and  has been authorized for detection and/or diagnosis of SARS-CoV-2 by FDA under an Emergency Use Authorization (EUA).  This EUA will remain  in effect (meaning this test can be used) for the duration of  the COVID-19 de claration under Section 564(b)(1) of the Act, 21 U.S.C. section 360bbb-3(b)(1), unless the authorization is terminated or revoked sooner. Performed at [facility], [place] [place], [place] [address]   SARS CORONAVIRUS 2 (TAT 6-24 HRS) Nasopharyngeal Nasopharyngeal Swab     Status: None   Collection Time: 09/15/19  7:35 PM   Specimen: Nasopharyngeal Swab  Result Value Ref Range Status   SARS Coronavirus 2 NEGATIVE NEGATIVE Final    Comment: (NOTE) SARS-CoV-2 target nucleic acids are NOT DETECTED. The SARS-CoV-2 RNA is generally detectable in upper and lower respiratory specimens during the acute phase of infection. Negative results do not preclude SARS-CoV-2 infection, do not rule out co-infections with other pathogens, and should not be used as the sole basis for treatment or other patient management decisions. Negative results must be combined with clinical observations, patient history, and epidemiological information. The expected result is Negative. Fact Sheet for Patients: SugarRoll.be Fact Sheet for Healthcare Providers: https://www.woods-mathews.com/ This test is not yet approved or cleared by the [place] FDA and  has been authorized for detection and/or diagnosis of SARS-CoV-2 by FDA under an Emergency Use Authorization (EUA). This EUA will remain  in effect (meaning this test can be used) for the duration of the COVID-19 declaration under Section 56 4(b)(1) of the Act, 21 U.S.C. section 360bbb-3(b)(1), unless the authorization is terminated or revoked sooner. Performed at [facility], [place] [address] [place], [place] [address]   Respiratory Panel by RT PCR (Flu A&B, Covid) - Nasopharyngeal Swab     Status: None   Collection Time: 09/15/19 10:13 PM   Specimen: Nasopharyngeal Swab  Result Value Ref Range Status   SARS  Coronavirus 2 by RT PCR NEGATIVE NEGATIVE Final    Comment: (NOTE) SARS-CoV-2 target nucleic acids are NOT DETECTED. The SARS-CoV-2 RNA is generally detectable in upper respiratoy specimens during the acute phase of infection. The lowest concentration of SARS-CoV-2 viral copies this assay can detect is 131 copies/mL. A negative  result does not preclude SARS-Cov-2 infection and should not be used as the sole basis for treatment or other patient management decisions. A negative result may occur with  improper specimen collection/handling, submission of specimen other than nasopharyngeal swab, presence of viral mutation(s) within the areas targeted by this assay, and inadequate number of viral copies (<131 copies/mL). A negative result must be combined with clinical observations, patient history, and epidemiological information. The expected result is Negative. Fact Sheet for Patients:  PinkCheek.be Fact Sheet for Healthcare Providers:  GravelBags.it This test is not yet ap proved or cleared by the [place] FDA and  has been authorized for detection and/or diagnosis of SARS-CoV-2 by FDA under an Emergency Use Authorization (EUA). This EUA will remain  in effect (meaning this test can be used) for the duration of the COVID-19 declaration under Section 564(b)(1) of the Act, 21 U.S.C. section 360bbb-3(b)(1), unless the authorization is terminated or revoked sooner.    Influenza A by PCR NEGATIVE NEGATIVE Final   Influenza B by PCR NEGATIVE NEGATIVE Final    Comment: (NOTE) The Xpert Xpress SARS-CoV-2/FLU/RSV assay is intended as an aid in  the diagnosis of influenza from Nasopharyngeal swab specimens and  should not be used as a sole basis for treatment. Nasal washings and  aspirates are unacceptable for Xpert Xpress SARS-CoV-2/FLU/RSV  testing. Fact Sheet for Patients: PinkCheek.be Fact Sheet  for Healthcare Providers: GravelBags.it This test is not yet approved or cleared by the [place] FDA and  has been authorized for detection and/or diagnosis of SARS-CoV-2 by  FDA under an Emergency Use Authorization (EUA). This EUA will remain  in effect (meaning this test can be used) for the duration of the  Covid-19 declaration under Section 564(b)(1) of the Act, 21  U.S.C. section 360bbb-3(b)(1), unless the authorization is  terminated or revoked. Performed at [facility], [place] [address], [place], [place] [address]   MRSA PCR Screening     Status: None   Collection Time: 09/16/19 11:03 AM   Specimen: Nasal Mucosa; Nasopharyngeal  Result Value Ref Range Status   MRSA by PCR NEGATIVE NEGATIVE Final    Comment:        The GeneXpert MRSA Assay (FDA approved for NASAL specimens only), is one component of a comprehensive MRSA colonization surveillance program. It is not intended to diagnose MRSA infection nor to guide or monitor treatment for MRSA infections. Performed at [facility], [place] [address], [place], [place] [address]   Respiratory Panel by PCR     Status: None   Collection Time: 09/16/19 12:43 PM   Specimen: Nasopharyngeal Swab; Respiratory  Result Value Ref Range Status   Adenovirus NOT DETECTED NOT DETECTED Final   Coronavirus 229E NOT DETECTED NOT DETECTED Final    Comment: (NOTE) The Coronavirus on the Respiratory Panel, DOES NOT test for the novel  Coronavirus (2019 nCoV)    Coronavirus HKU1 NOT DETECTED NOT DETECTED Final   Coronavirus NL63 NOT DETECTED NOT DETECTED Final   Coronavirus OC43 NOT DETECTED NOT DETECTED Final   Metapneumovirus NOT DETECTED NOT DETECTED Final   Rhinovirus / Enterovirus NOT DETECTED NOT DETECTED Final   Influenza A NOT DETECTED NOT DETECTED Final   Influenza B NOT DETECTED NOT DETECTED Final   Parainfluenza Virus 1 NOT DETECTED NOT DETECTED Final   Parainfluenza Virus 2  NOT DETECTED NOT DETECTED Final   Parainfluenza Virus 3 NOT DETECTED NOT DETECTED Final   Parainfluenza Virus 4 NOT DETECTED NOT DETECTED  Final   Respiratory Syncytial Virus NOT DETECTED NOT DETECTED Final   Bordetella pertussis NOT DETECTED NOT DETECTED Final   Chlamydophila pneumoniae NOT DETECTED NOT DETECTED Final   Mycoplasma pneumoniae NOT DETECTED NOT DETECTED Final    Comment: Performed at [facility], [place] [address], [place], [place] [address]  Culture, blood (routine x 2)     Status: None (Preliminary result)   Collection Time: 09/17/19  5:04 PM   Specimen: BLOOD  Result Value Ref Range Status   Specimen Description   Final    BLOOD LEFT ANTECUBITAL Performed at [place] [address], [place], [place] [address]    Special Requests   Final    BOTTLES DRAWN AEROBIC AND ANAEROBIC Blood Culture adequate volume   Culture   Final    NO GROWTH < 24 HOURS Performed at [facility], [place] [address] [place], [place] [address]    Report Status PENDING  Incomplete  Culture, blood (routine x 2)     Status: None (Preliminary result)   Collection Time: 09/17/19  5:05 PM   Specimen: BLOOD  Result Value Ref Range Status   Specimen Description   Final    BLOOD RIGHT ANTECUBITAL Performed at [place] [address], [place], [place] [address]    Special Requests   Final    BOTTLES DRAWN AEROBIC AND ANAEROBIC Blood Culture adequate volume   Culture   Final    NO GROWTH < 24 HOURS Performed at [facility], [place] [address] [place],  [address]    Report Status PENDING  Incomplete         Radiology Studies: DG CHEST PORT 1 VIEW  Result Date: 09/18/2019 CLINICAL DATA:  Pneumonia. EXAM: PORTABLE CHEST 1 VIEW COMPARISON:  September 15, 2019 FINDINGS: The heart size and mediastinal contours are within normal limits. Both lungs are clear. The visualized skeletal structures are unremarkable. IMPRESSION: No active  disease. Electronically Signed   By: Dorise Bullion III M.D   On: 09/18/2019 09:12        Scheduled Meds: . azithromycin  500 mg Oral Daily  . chlorhexidine  15 mL Mouth Rinse BID  . Chlorhexidine Gluconate Cloth  6 each Topical Daily  . enoxaparin (LOVENOX) injection  30 mg Subcutaneous Q24H  . feeding supplement (ENSURE ENLIVE)  237 mL Oral BID WC  . feeding supplement (PRO-STAT SUGAR FREE 64)  30 mL Oral Daily  . magnesium oxide  400 mg Oral TID  . mouth rinse  15 mL Mouth Rinse q12n4p  . metoprolol tartrate  25 mg Oral TID  . multivitamin with minerals  1 tablet Oral Daily  . nutrition supplement (JUVEN)  1 packet Oral BID BM  . polyethylene glycol  17 g Oral Daily   Continuous Infusions: . sodium chloride Stopped (09/16/19 0500)  . cefTRIAXone (ROCEPHIN)  IV Stopped (09/18/19 0620)     LOS: 3 days    Time spent: 27 minutes with over 50% of the time coordinating the patient's care    Harold Hedge, DO [facility] Pager [phone]  If 7PM-7AM, please contact night-coverage [email or web address] Password [email or web address] 09/18/2019, 1:46 PM

## 2019-09-18 NOTE — Progress Notes (Signed)
PT Cancellation Note  Patient Details Name: Peter Hayes MRN: [id number] DOB: Dec 31, 1995   Cancelled Treatment:     PT order received but PT eval deferred.  OT advised by pt's father that pt is total assist at home and just lifted bed<>chair.  No PT needs at this time.  Pt service will sign off.   Lewi Drost 09/18/2019, 11:38 AM

## 2019-09-18 NOTE — Progress Notes (Signed)
Have been in room on two separate occasions to place pt. On bipap for night and father stated that patient is not ready.

## 2019-09-18 NOTE — Progress Notes (Signed)
OT Cancellation Note  Patient Details Name: Peter Hayes MRN: [id number] DOB: March 11, 1996   Cancelled Treatment:    Reason Eval/Treat Not Completed: OT screened, no needs identified, will sign off .  Spoke to pt's father. He lifts pt into electric wc and pt needs total A at baseline. Demetrice Amstutz 09/18/2019, 9:53 AM  Karsten Ro, OTR/L Acute Rehabilitation Services 09/18/2019

## 2019-09-19 DIAGNOSIS — R000 Tachycardia, unspecified: Secondary | ICD-10-CM

## 2019-09-19 DIAGNOSIS — I950 Idiopathic hypotension: Secondary | ICD-10-CM

## 2019-09-19 LAB — CBC
HCT: 37.7 % — ABNORMAL LOW (ref 39.0–52.0)
Hemoglobin: 11.5 g/dL — ABNORMAL LOW (ref 13.0–17.0)
MCH: 28 pg (ref 26.0–34.0)
MCHC: 30.5 g/dL (ref 30.0–36.0)
MCV: 92 fL (ref 80.0–100.0)
Platelets: 124 10*3/uL — ABNORMAL LOW (ref 150–400)
RBC: 4.1 MIL/uL — ABNORMAL LOW (ref 4.22–5.81)
RDW: 14 % (ref 11.5–15.5)
WBC: 5.5 10*3/uL (ref 4.0–10.5)
nRBC: 0 % (ref 0.0–0.2)

## 2019-09-19 LAB — IRON AND TIBC
Iron: 57 ug/dL (ref 45–182)
Saturation Ratios: 27 % (ref 17.9–39.5)
TIBC: 214 ug/dL — ABNORMAL LOW (ref 250–450)
UIBC: 157 ug/dL

## 2019-09-19 LAB — BASIC METABOLIC PANEL
Anion gap: 11 (ref 5–15)
BUN: <5 mg/dL — ABNORMAL LOW (ref 6–20)
CO2: 34 mmol/L — ABNORMAL HIGH (ref 22–32)
Calcium: 8.6 mg/dL — ABNORMAL LOW (ref 8.9–10.3)
Chloride: 97 mmol/L — ABNORMAL LOW (ref 98–111)
Creatinine, Ser: <0.3 mg/dL — ABNORMAL LOW (ref 0.61–1.24)
Glucose, Bld: 93 mg/dL (ref 70–99)
Potassium: 3.5 mmol/L (ref 3.5–5.1)
Sodium: 142 mmol/L (ref 135–145)

## 2019-09-19 LAB — FERRITIN: Ferritin: 90 ng/mL (ref 24–336)

## 2019-09-19 MED ORDER — METOPROLOL TARTRATE 5 MG/5ML IV SOLN
2.5000 mg | Freq: Once | INTRAVENOUS | Status: AC
  Administered 2019-09-19: 15:00:00 2.5 mg via INTRAVENOUS
  Filled 2019-09-19: qty 5

## 2019-09-19 MED ORDER — METOPROLOL TARTRATE 12.5 MG HALF TABLET
12.5000 mg | ORAL_TABLET | Freq: Two times a day (BID) | ORAL | Status: DC
  Administered 2019-09-19 – 2019-09-20 (×2): 12.5 mg via ORAL
  Filled 2019-09-19 (×2): qty 1

## 2019-09-19 MED ORDER — METOPROLOL TARTRATE 12.5 MG HALF TABLET: 12.5000 mg | ORAL_TABLET | Freq: Two times a day (BID) | ORAL | Status: DC

## 2019-09-19 NOTE — Progress Notes (Signed)
PROGRESS NOTE    Peter Hayes    Code Status: Full Code  LPF:[id number] DOB: January 10, 1996 DOA: 09/15/2019  PCP: Penni Bombard, [facility] Summary  This is a 23 year old male with past medical history of Duchenne muscular dystrophy, recurrent sigmoid volvulus, chronic hypoxic respiratory failure on 1 L oxygen daily and noninvasive ventilation nightly iron deficiency anemia who presented with shortness of breath.  Patient began to feel congested and bringing up mucus over the past several days but on 12/8 began to be more short of breath.  He was in [facility] ER and spiked a fever to 100.4 and was on BiPAP before transitioning to nasal cannula and transferred to [facility] 12/9.  Covid negative.  Started on treatment for CAP.  Viral panel negative.  A & P   Active Problems:   Acute respiratory failure with hypoxia and hypercapnia (HCC)   Severe sepsis (HCC)   Pressure injury of skin   Acute on chronic hypoxic and hypercapnic respiratory failure secondary to suspected community-acquired pneumonia symptomatically improved and stable vitals with persistently low BPs.  Strep pneumo urine antigen and viral panel negative. day 4/5 ceftriaxone/azithromycin.  Chest x-ray with poor inspiratory effort and without acute abnormality on 12/8.  Repeat chest x-ray 12/11 unremarkable.  Typically on 1 L nasal cannula at baseline and noninvasive ventilation nightly at home, tolerating BiPAP nightly and 4 L nasal cannula during the day currently.  Wheeze improved. Cultures with no growth to date.  -Continue CAP therapy -Duoneb every 4 hours as needed  Normocytic anemia stable -Iron studies  Nausea without vomiting -Zofran as needed  Hypokalemia resolved  Duchenne muscular dystrophy Stable  Iron deficiency anemia patient is a Jehov[employer]'s Witness.  Hemoglobin 11.6-> 10.8->11.5 -Continue to monitor  Tachycardia takes metoprolol outpatient -hold beta blocker for now due to persistent  hypotension  Hypotension Persistent, may be patient's baseline.  -Hold beta blocker -Consider 500 cc fluid bolus.  Underweight -RD on board.  Appreciate dietary recommendations    DVT prophylaxis: Lovenox Family Communication: Patient's sister has been updated at bedside Disposition Plan: Completion of antibiotics and Case manager discussion with father as he is the patient's caregiver at home and requires more assistance at home for safe discharge.   Consultants  None  Procedures  None  Antibiotics   Anti-infectives (From admission, onward)   Start     Dose/Rate Route Frequency Ordered Stop   09/18/19 1000  azithromycin (ZITHROMAX) tablet 500 mg     500 mg Oral Daily 09/17/19 1122 09/21/19 0959   09/16/19 0500  cefTRIAXone (ROCEPHIN) 2 g in sodium chloride 0.9 % 100 mL IVPB     2 g 200 mL/hr over 30 Minutes Intravenous Every 24 hours 09/16/19 0458 09/21/19 0459   09/16/19 0500  azithromycin (ZITHROMAX) 500 mg in sodium chloride 0.9 % 250 mL IVPB  Status:  Discontinued     500 mg 250 mL/hr over 60 Minutes Intravenous Every 24 hours 09/16/19 0458 09/17/19 1122           Subjective   Patient and sister at bedside. Patient states he is feeling better and without any shortness of breath or wheeze. States he is without nausea since the zofran. He has improved wheeze with the albuterol yesterday. No complaints today.   Objective   Vitals:   09/19/19 0400 09/19/19 0744 09/19/19 0754 09/19/19 0800  BP: (!) 89/46   (!) 76/33  Pulse:  85 88 81  Resp: (!) 25 15 17  19  Temp:    97.9 F (36.6 C)  TempSrc:      SpO2: 100% 97% 100% 99%  Weight:      Height:        Intake/Output Summary (Last 24 hours) at 09/19/2019 1142 Last data filed at 09/19/2019 0600 Gross per 24 hour  Intake 336.98 ml  Output 425 ml  Net -88.02 ml   Filed Weights   09/15/19 1543 09/16/19 1222  Weight: 48.8 kg 42.3 kg    Examination:  Physical Exam Vitals and nursing note reviewed. Exam  conducted with a chaperone present.  Constitutional:      General: He is not in acute distress.    Appearance: He is not toxic-appearing.     Comments: Chronically ill   HENT:     Head: Normocephalic and atraumatic.     Nose: Nose normal.     Mouth/Throat:     Mouth: Mucous membranes are dry.  Eyes:     Extraocular Movements: Extraocular movements intact.  Cardiovascular:     Rate and Rhythm: Regular rhythm. Tachycardia present.  Pulmonary:     Effort: Pulmonary effort is normal. No respiratory distress.     Breath sounds: No wheezing.  Abdominal:     General: Abdomen is flat.     Palpations: Abdomen is soft.  Musculoskeletal:        General: No swelling or tenderness.     Cervical back: Normal range of motion. No rigidity.  Neurological:     General: No focal deficit present.     Mental Status: He is alert. Mental status is at baseline.  Psychiatric:        Mood and Affect: Mood normal.        Behavior: Behavior normal.     Data Reviewed: I have personally reviewed following labs and imaging studies  CBC: Recent Labs  Lab 09/15/19 1615 09/16/19 0527 09/16/19 1347 09/17/19 0239 09/18/19 0241 09/19/19 0211  WBC 18.4* 7.0 6.6 8.9 6.0 5.5  NEUTROABS 14.2*  --  4.6  --   --   --   HGB 15.4 9.7* 11.6* 10.8* 11.5* 11.5*  HCT 49.1 31.9* 38.5* 35.9* 38.1* 37.7*  MCV 88.8 92.5 92.5 92.1 93.4 92.0  PLT 283 97* 113* 133* 102* 962*   Basic Metabolic Panel: Recent Labs  Lab 09/16/19 0527 09/16/19 1347 09/17/19 0239 09/18/19 0241 09/19/19 0211  NA 140 143 137 140 142  K 2.9* 3.3* 3.9 3.2* 3.5  CL 109 104 99 99 97*  CO2 _0 34*  GLUCOSE 62* 69* 122* 94 93  BUN 9 7 <5* <5* <5*  CREATININE <0.30* <0.30* <0.30* <0.30* <0.30*  CALCIUM 7.5* 8.2* 8.3* 8.3* 8.6*  MG  --   --   --  1.8  --    GFR: CrCl cannot be calculated (This lab value cannot be used to calculate CrCl because it is not a number: <0.30). Liver Function Tests: No results for input(s): AST,  ALT, ALKPHOS, BILITOT, PROT, ALBUMIN in the last 168 hours. No results for input(s): LIPASE, AMYLASE in the last 168 hours. No results for input(s): AMMONIA in the last 168 hours. Coagulation Profile: No results for input(s): INR, PROTIME in the last 168 hours. Cardiac Enzymes: No results for input(s): CKTOTAL, CKMB, CKMBINDEX, TROPONINI in the last 168 hours. BNP (last 3 results) No results for input(s): PROBNP in the last 8760 hours. HbA1C: No results for input(s): HGBA1C in the last 72 hours. CBG: No results  for input(s): GLUCAP in the last 168 hours. Lipid Profile: No results for input(s): CHOL, HDL, LDLCALC, TRIG, CHOLHDL, LDLDIRECT in the last 72 hours. Thyroid Function Tests: No results for input(s): TSH, T4TOTAL, FREET4, T3FREE, THYROIDAB in the last 72 hours. Anemia Panel: No results for input(s): VITAMINB12, FOLATE, FERRITIN, TIBC, IRON, RETICCTPCT in the last 72 hours. Sepsis Labs: Recent Labs  Lab 09/16/19 0115 09/16/19 0345 09/16/19 0527 09/17/19 0524  PROCALCITON  --   --  0.18  --   LATICACIDVEN 0.5 0.4*  --  0.6    Recent Results (from the past 240 hour(s))  SARS Coronavirus 2 Ag (30 min TAT) - Nasal Swab (BD Veritor Kit)     Status: None   Collection Time: 09/15/19  4:15 PM   Specimen: Nasal Swab (BD Veritor Kit)  Result Value Ref Range Status   SARS Coronavirus 2 Ag NEGATIVE NEGATIVE Final    Comment: (NOTE) SARS-CoV-2 antigen NOT DETECTED.  Negative results are presumptive.  Negative results do not preclude SARS-CoV-2 infection and should not be used as the sole basis for treatment or other patient management decisions, including infection  control decisions, particularly in the presence of clinical signs and  symptoms consistent with COVID-19, or in those who have been in contact with the virus.  Negative results must be combined with clinical observations, patient history, and epidemiological information. The expected result is Negative. Fact Sheet  for Patients: PodPark.tn Fact Sheet for Healthcare Providers: GiftContent.is This test is not yet approved or cleared by the [place] FDA and  has been authorized for detection and/or diagnosis of SARS-CoV-2 by FDA under an Emergency Use Authorization (EUA).  This EUA will remain in effect (meaning this test can be used) for the duration of  the COVID-19 de claration under Section 564(b)(1) of the Act, 21 U.S.C. section 360bbb-3(b)(1), unless the authorization is terminated or revoked sooner. Performed at [facility], [address] [place], [place] [address]   SARS CORONAVIRUS 2 (TAT 6-24 HRS) Nasopharyngeal Nasopharyngeal Swab     Status: None   Collection Time: 09/15/19  7:35 PM   Specimen: Nasopharyngeal Swab  Result Value Ref Range Status   SARS Coronavirus 2 NEGATIVE NEGATIVE Final    Comment: (NOTE) SARS-CoV-2 target nucleic acids are NOT DETECTED. The SARS-CoV-2 RNA is generally detectable in upper and lower respiratory specimens during the acute phase of infection. Negative results do not preclude SARS-CoV-2 infection, do not rule out co-infections with other pathogens, and should not be used as the sole basis for treatment or other patient management decisions. Negative results must be combined with clinical observations, patient history, and epidemiological information. The expected result is Negative. Fact Sheet for Patients: SugarRoll.be Fact Sheet for Healthcare Providers: https://www.woods-mathews.com/ This test is not yet approved or cleared by the [place] FDA and  has been authorized for detection and/or diagnosis of SARS-CoV-2 by FDA under an Emergency Use Authorization (EUA). This EUA will remain  in effect (meaning this test can be used) for the duration of the COVID-19 declaration under Section 56 4(b)(1) of the Act, 21 U.S.C. section  360bbb-3(b)(1), unless the authorization is terminated or revoked sooner. Performed at [facility], [place] [address], [place], [place] [address]   Respiratory Panel by RT PCR (Flu A&B, Covid) - Nasopharyngeal Swab     Status: None   Collection Time: 09/15/19 10:13 PM   Specimen: Nasopharyngeal Swab  Result Value Ref Range Status   SARS Coronavirus 2 by  RT PCR NEGATIVE NEGATIVE Final    Comment: (NOTE) SARS-CoV-2 target nucleic acids are NOT DETECTED. The SARS-CoV-2 RNA is generally detectable in upper respiratoy specimens during the acute phase of infection. The lowest concentration of SARS-CoV-2 viral copies this assay can detect is 131 copies/mL. A negative result does not preclude SARS-Cov-2 infection and should not be used as the sole basis for treatment or other patient management decisions. A negative result may occur with  improper specimen collection/handling, submission of specimen other than nasopharyngeal swab, presence of viral mutation(s) within the areas targeted by this assay, and inadequate number of viral copies (<131 copies/mL). A negative result must be combined with clinical observations, patient history, and epidemiological information. The expected result is Negative. Fact Sheet for Patients:  PinkCheek.be Fact Sheet for Healthcare Providers:  GravelBags.it This test is not yet ap proved or cleared by the [place] FDA and  has been authorized for detection and/or diagnosis of SARS-CoV-2 by FDA under an Emergency Use Authorization (EUA). This EUA will remain  in effect (meaning this test can be used) for the duration of the COVID-19 declaration under Section 564(b)(1) of the Act, 21 U.S.C. section 360bbb-3(b)(1), unless the authorization is terminated or revoked sooner.    Influenza A by PCR NEGATIVE NEGATIVE Final   Influenza B by PCR NEGATIVE NEGATIVE Final    Comment: (NOTE) The Xpert  Xpress SARS-CoV-2/FLU/RSV assay is intended as an aid in  the diagnosis of influenza from Nasopharyngeal swab specimens and  should not be used as a sole basis for treatment. Nasal washings and  aspirates are unacceptable for Xpert Xpress SARS-CoV-2/FLU/RSV  testing. Fact Sheet for Patients: PinkCheek.be Fact Sheet for Healthcare Providers: GravelBags.it This test is not yet approved or cleared by the [place] FDA and  has been authorized for detection and/or diagnosis of SARS-CoV-2 by  FDA under an Emergency Use Authorization (EUA). This EUA will remain  in effect (meaning this test can be used) for the duration of the  Covid-19 declaration under Section 564(b)(1) of the Act, 21  U.S.C. section 360bbb-3(b)(1), unless the authorization is  terminated or revoked. Performed at [facility], [place] [address], [place], [place] [address]   MRSA PCR Screening     Status: None   Collection Time: 09/16/19 11:03 AM   Specimen: Nasal Mucosa; Nasopharyngeal  Result Value Ref Range Status   MRSA by PCR NEGATIVE NEGATIVE Final    Comment:        The GeneXpert MRSA Assay (FDA approved for NASAL specimens only), is one component of a comprehensive MRSA colonization surveillance program. It is not intended to diagnose MRSA infection nor to guide or monitor treatment for MRSA infections. Performed at [facility], [place] [address] [place], [place] [address]   Respiratory Panel by PCR     Status: None   Collection Time: 09/16/19 12:43 PM   Specimen: Nasopharyngeal Swab; Respiratory  Result Value Ref Range Status   Adenovirus NOT DETECTED NOT DETECTED Final   Coronavirus 229E NOT DETECTED NOT DETECTED Final    Comment: (NOTE) The Coronavirus on the Respiratory Panel, DOES NOT test for the novel  Coronavirus (2019 nCoV)    Coronavirus HKU1 NOT DETECTED NOT DETECTED Final   Coronavirus NL63 NOT DETECTED NOT  DETECTED Final   Coronavirus OC43 NOT DETECTED NOT DETECTED Final   Metapneumovirus NOT DETECTED NOT DETECTED Final   Rhinovirus / Enterovirus NOT DETECTED NOT DETECTED Final   Influenza A NOT DETECTED NOT DETECTED  Final   Influenza B NOT DETECTED NOT DETECTED Final   Parainfluenza Virus 1 NOT DETECTED NOT DETECTED Final   Parainfluenza Virus 2 NOT DETECTED NOT DETECTED Final   Parainfluenza Virus 3 NOT DETECTED NOT DETECTED Final   Parainfluenza Virus 4 NOT DETECTED NOT DETECTED Final   Respiratory Syncytial Virus NOT DETECTED NOT DETECTED Final   Bordetella pertussis NOT DETECTED NOT DETECTED Final   Chlamydophila pneumoniae NOT DETECTED NOT DETECTED Final   Mycoplasma pneumoniae NOT DETECTED NOT DETECTED Final    Comment: Performed at [facility], [place] [address] [place], [place] [address]  Culture, blood (routine x 2)     Status: None (Preliminary result)   Collection Time: 09/17/19  5:04 PM   Specimen: BLOOD  Result Value Ref Range Status   Specimen Description   Final    BLOOD LEFT ANTECUBITAL Performed at [place] [address] [place], [place] [address]    Special Requests   Final    BOTTLES DRAWN AEROBIC AND ANAEROBIC Blood Culture adequate volume   Culture   Final    NO GROWTH 2 DAYS Performed at [facility], [place] [address] [place], [place] [address]    Report Status PENDING  Incomplete  Culture, blood (routine x 2)     Status: None (Preliminary result)   Collection Time: 09/17/19  5:05 PM   Specimen: BLOOD  Result Value Ref Range Status   Specimen Description   Final    BLOOD RIGHT ANTECUBITAL Performed at [place] [address], [place], [place] [address]    Special Requests   Final    BOTTLES DRAWN AEROBIC AND ANAEROBIC Blood Culture adequate volume   Culture   Final    NO GROWTH 2 DAYS Performed at [facility], [place] [address] [place], [place] [address]    Report Status PENDING   Incomplete         Radiology Studies: DG CHEST PORT 1 VIEW  Result Date: 09/18/2019 CLINICAL DATA:  Pneumonia. EXAM: PORTABLE CHEST 1 VIEW COMPARISON:  September 15, 2019 FINDINGS: The heart size and mediastinal contours are within normal limits. Both lungs are clear. The visualized skeletal structures are unremarkable. IMPRESSION: No active disease. Electronically Signed   By: Dorise Bullion III M.D   On: 09/18/2019 09:12        Scheduled Meds: . azithromycin  500 mg Oral Daily  . chlorhexidine  15 mL Mouth Rinse BID  . Chlorhexidine Gluconate Cloth  6 each Topical Daily  . enoxaparin (LOVENOX) injection  30 mg Subcutaneous Q24H  . feeding supplement (ENSURE ENLIVE)  237 mL Oral BID WC  . feeding supplement (PRO-STAT SUGAR FREE 64)  30 mL Oral Daily  . magnesium oxide  400 mg Oral TID  . mouth rinse  15 mL Mouth Rinse q12n4p  . multivitamin with minerals  1 tablet Oral Daily  . nutrition supplement (JUVEN)  1 packet Oral BID BM  . polyethylene glycol  17 g Oral Daily   Continuous Infusions: . sodium chloride Stopped (09/16/19 0500)  . cefTRIAXone (ROCEPHIN)  IV Stopped (09/19/19 0441)     LOS: 4 days    Time spent: 20 minutes with over 50% of the time coordinating the patient's care    Harold Hedge, DO [facility] Pager [phone]  If 7PM-7AM, please contact night-coverage [email or web address] Password [email or web address] 09/19/2019, 11:42 AM

## 2019-09-20 DIAGNOSIS — I959 Hypotension, unspecified: Secondary | ICD-10-CM

## 2019-09-20 LAB — CBC
HCT: 37.8 % — ABNORMAL LOW (ref 39.0–52.0)
Hemoglobin: 11.5 g/dL — ABNORMAL LOW (ref 13.0–17.0)
MCH: 28.3 pg (ref 26.0–34.0)
MCHC: 30.4 g/dL (ref 30.0–36.0)
MCV: 93.1 fL (ref 80.0–100.0)
Platelets: 129 10*3/uL — ABNORMAL LOW (ref 150–400)
RBC: 4.06 MIL/uL — ABNORMAL LOW (ref 4.22–5.81)
RDW: 14.1 % (ref 11.5–15.5)
WBC: 3.7 10*3/uL — ABNORMAL LOW (ref 4.0–10.5)
nRBC: 0 % (ref 0.0–0.2)

## 2019-09-20 LAB — BASIC METABOLIC PANEL
Anion gap: 11 (ref 5–15)
BUN: <5 mg/dL — ABNORMAL LOW (ref 6–20)
CO2: 38 mmol/L — ABNORMAL HIGH (ref 22–32)
Calcium: 8.7 mg/dL — ABNORMAL LOW (ref 8.9–10.3)
Chloride: 92 mmol/L — ABNORMAL LOW (ref 98–111)
Creatinine, Ser: <0.3 mg/dL — ABNORMAL LOW (ref 0.61–1.24)
Glucose, Bld: 69 mg/dL — ABNORMAL LOW (ref 70–99)
Potassium: 3.3 mmol/L — ABNORMAL LOW (ref 3.5–5.1)
Sodium: 141 mmol/L (ref 135–145)

## 2019-09-20 LAB — GLUCOSE, CAPILLARY
Glucose-Capillary: 64 mg/dL — ABNORMAL LOW (ref 70–99)
Glucose-Capillary: 92 mg/dL (ref 70–99)

## 2019-09-20 MED ORDER — METOPROLOL TARTRATE 5 MG/5ML IV SOLN: 2.5000 mg | Freq: Once | INTRAVENOUS | Status: DC

## 2019-09-20 MED ORDER — METOPROLOL TARTRATE 5 MG/5ML IV SOLN: 5.0000 mg | Freq: Once | INTRAVENOUS | Status: DC

## 2019-09-20 MED ORDER — SODIUM CHLORIDE 0.9 % IV SOLN
INTRAVENOUS | Status: AC
  Administered 2019-09-20: 13:00:00 via INTRAVENOUS

## 2019-09-20 MED ORDER — METOPROLOL TARTRATE 5 MG/5ML IV SOLN: 2.5000 mg | Freq: Once | INTRAVENOUS | Status: DC | PRN

## 2019-09-20 MED ORDER — POTASSIUM CHLORIDE 10 MEQ/100ML IV SOLN
10.0000 meq | INTRAVENOUS | Status: AC
  Administered 2019-09-20 (×2): 10 meq via INTRAVENOUS
  Filled 2019-09-20 (×2): qty 100

## 2019-09-20 MED ORDER — METOPROLOL TARTRATE 12.5 MG HALF TABLET
12.5000 mg | ORAL_TABLET | Freq: Three times a day (TID) | ORAL | Status: DC
  Administered 2019-09-20 – 2019-09-21 (×2): 12.5 mg via ORAL
  Filled 2019-09-20 (×2): qty 1

## 2019-09-20 MED ORDER — POLYETHYLENE GLYCOL 3350 17 G PO PACK
17.0000 g | PACK | Freq: Every day | ORAL | Status: DC | PRN
  Administered 2019-09-20: 17 g via ORAL
  Filled 2019-09-20: qty 1

## 2019-09-20 NOTE — Progress Notes (Signed)
PROGRESS NOTE    Peter Hayes    Code Status: Full Code  HBZ:[id number] DOB: [birth year] DOA: 09/15/2019  PCP: Penni Bombard, [facility] Summary  This is a 23 year old male with past medical history of Duchenne muscular dystrophy, recurrent sigmoid volvulus, chronic hypoxic respiratory failure on 1 L oxygen daily and noninvasive ventilation nightly iron deficiency anemia who presented with shortness of breath.  Patient began to feel congested and bringing up mucus over the past several days but on 12/8 began to be more short of breath.  He was in [facility] ER and spiked a fever to 100.4 and was on BiPAP before transitioning to nasal cannula and transferred to [facility] 12/9.  Covid negative.  Started on treatment for CAP.  Viral panel negative.  A & P   Active Problems:   Acute respiratory failure with hypoxia and hypercapnia (HCC)   Severe sepsis (HCC)   Pressure injury of skin   Acute on chronic hypoxic and hypercapnic respiratory failure secondary to suspected community-acquired pneumonia in setting of Duchenne muscular dystrophy improved.  Strep pneumo urine antigen and viral panel negative. day 5/5 ceftriaxone/azithromycin.  Chest x-ray with poor inspiratory effort and without acute abnormality on 12/8.  Repeat chest x-ray 12/11 unremarkable.  Typically on 1 L nasal cannula at baseline and noninvasive ventilation nightly at home, tolerating BiPAP nightly and 4 L nasal cannula during the day currently.  Wheeze improved. Cultures with no growth to date.  -Continue CAP therapy -Duoneb Hayes 4 hours as needed   Anemia of chronic disease anemia stable. patient is a Restaurant manager, fast food.    Nausea without vomiting -Zofran as needed  Metabolic alkalosis possibly from GI losses Typically takes half a dose of MiraLAX at home however has been getting full dose with multiple BMs. -Change MiraLAX to as needed -will give 500 cc bolus IV fluids for hydration  Hypokalemia, likely from GI  losses as above replete  Duchenne muscular dystrophy Stable -Patient should follow-up with his primary care and neurologist once discharged as he may be having progression of his underlying disease  Iron deficiency anemia  -On iron supplement outpatient  Tachycardia takes metoprolol 25 mg 3 times daily outpatient however has had episodes of hypotension.  Metoprolol was changed to 12.5 mg twice daily with improved rates and pressure  -Continue lower dose beta-blocker at discharge  Hypotension Improved with regimen as above. -IV fluids as above  Underweight -RD on board.  Appreciate dietary recommendations    DVT prophylaxis: Lovenox Family Communication: Patient's father has been updated at bedside Disposition Plan: Transfer to med telemetry.  Pending completion of antibiotics (today is the last day) and Case manager discussion with father as he is the patient's caregiver at home and requires more assistance at home for safe discharge.  Likely able to DC tomorrow  Consultants  None  Procedures  None  Antibiotics   Anti-infectives (From admission, onward)   Start     Dose/Rate Route Frequency Ordered Stop   09/18/19 1000  azithromycin (ZITHROMAX) tablet 500 mg     500 mg Oral Daily 09/17/19 1122 09/21/19 1159   09/16/19 0500  cefTRIAXone (ROCEPHIN) 2 g in sodium chloride 0.9 % 100 mL IVPB     2 g 200 mL/hr over 30 Minutes Intravenous Hayes 24 hours 09/16/19 0458 09/20/19 0530   09/16/19 0500  azithromycin (ZITHROMAX) 500 mg in sodium chloride 0.9 % 250 mL IVPB  Status:  Discontinued     500 mg  250 mL/hr over 60 Minutes Intravenous Hayes 24 hours 09/16/19 0458 09/17/19 1122           Subjective   Patient and father at bedside.  Patient states he feels improved but not at his baseline as of yet.  Father states he seems much better today.  He has been having increased BMs but father reports he has been getting more MiraLAX here than he typically takes at home.  Denies  any complaints  Objective   Vitals:   09/20/19 0900 09/20/19 1000 09/20/19 1100 09/20/19 1200  BP:  136/62    Pulse: 86 (!) 105 96   Resp: 12 (!) 25 11   Temp:    98.7 F (37.1 C)  TempSrc:      SpO2: 100% 100% 100%   Weight:      Height:        Intake/Output Summary (Last 24 hours) at 09/20/2019 1316 Last data filed at 09/20/2019 0600 Gross per 24 hour  Intake --  Output 325 ml  Net -325 ml   Filed Weights   09/15/19 1543 09/16/19 1222  Weight: 48.8 kg 42.3 kg    Examination:  Physical Exam Vitals and nursing note reviewed. Exam conducted with a chaperone present.  Constitutional:      Comments: Chronic ill appearance No acute distress  HENT:     Head: Normocephalic and atraumatic.     Nose: Nose normal.     Mouth/Throat:     Mouth: Mucous membranes are dry.  Eyes:     Extraocular Movements: Extraocular movements intact.  Cardiovascular:     Rate and Rhythm: Normal rate and regular rhythm.  Pulmonary:     Effort: Pulmonary effort is normal.     Breath sounds: Normal breath sounds.  Abdominal:     General: Abdomen is flat.     Palpations: Abdomen is soft.  Musculoskeletal:     Comments: At baseline debility  Neurological:     Mental Status: He is alert. Mental status is at baseline.  Psychiatric:        Mood and Affect: Mood normal.        Behavior: Behavior normal.     Data Reviewed: I have personally reviewed following labs and imaging studies  CBC: Recent Labs  Lab 09/15/19 1615 09/15/19 1903 09/16/19 1347 09/17/19 0239 09/18/19 0241 09/19/19 0211 09/20/19 0722  WBC 18.4*   < > 6.6 8.9 6.0 5.5 3.7*  NEUTROABS 14.2*  --  4.6  --   --   --   --   HGB 15.4  --  11.6* 10.8* 11.5* 11.5* 11.5*  HCT 49.1  --  38.5* 35.9* 38.1* 37.7* 37.8*  MCV 88.8   < > 92.5 92.1 93.4 92.0 93.1  PLT 283   < > 113* 133* 102* 124* 129*   < > = values in this interval not displayed.   Basic Metabolic Panel: Recent Labs  Lab 09/16/19 1347 09/17/19 0239  09/18/19 0241 09/19/19 0211 09/20/19 0722  NA 143 137 140 142 141  K 3.3* 3.9 3.2* 3.5 3.3*  CL 104 99 99 97* 92*  CO2 '29 28 30 ' 34* 38*  GLUCOSE 69* 122* 94 93 69*  BUN 7 <5* <5* <5* <5*  CREATININE <0.30* <0.30* <0.30* <0.30* <0.30*  CALCIUM 8.2* 8.3* 8.3* 8.6* 8.7*  MG  --   --  1.8  --   --    GFR: CrCl cannot be calculated (This lab value cannot be used  to calculate CrCl because it is not a number: <0.30). Liver Function Tests: No results for input(s): AST, ALT, ALKPHOS, BILITOT, PROT, ALBUMIN in the last 168 hours. No results for input(s): LIPASE, AMYLASE in the last 168 hours. No results for input(s): AMMONIA in the last 168 hours. Coagulation Profile: No results for input(s): INR, PROTIME in the last 168 hours. Cardiac Enzymes: No results for input(s): CKTOTAL, CKMB, CKMBINDEX, TROPONINI in the last 168 hours. BNP (last 3 results) No results for input(s): PROBNP in the last 8760 hours. HbA1C: No results for input(s): HGBA1C in the last 72 hours. CBG: Recent Labs  Lab 09/20/19 0950 09/20/19 1025  GLUCAP 64* 92   Lipid Profile: No results for input(s): CHOL, HDL, LDLCALC, TRIG, CHOLHDL, LDLDIRECT in the last 72 hours. Thyroid Function Tests: No results for input(s): TSH, T4TOTAL, FREET4, T3FREE, THYROIDAB in the last 72 hours. Anemia Panel: Recent Labs    09/19/19 1156  FERRITIN 90  TIBC 214*  IRON 57   Sepsis Labs: Recent Labs  Lab 09/16/19 0115 09/16/19 0345 09/16/19 0527 09/17/19 0524  PROCALCITON  --   --  0.18  --   LATICACIDVEN 0.5 0.4*  --  0.6    Recent Results (from the past 240 hour(s))  SARS Coronavirus 2 Ag (30 min TAT) - Nasal Swab (BD Veritor Kit)     Status: None   Collection Time: 09/15/19  4:15 PM   Specimen: Nasal Swab (BD Veritor Kit)  Result Value Ref Range Status   SARS Coronavirus 2 Ag NEGATIVE NEGATIVE Final    Comment: (NOTE) SARS-CoV-2 antigen NOT DETECTED.  Negative results are presumptive.  Negative results do not  preclude SARS-CoV-2 infection and should not be used as the sole basis for treatment or other patient management decisions, including infection  control decisions, particularly in the presence of clinical signs and  symptoms consistent with COVID-19, or in those who have been in contact with the virus.  Negative results must be combined with clinical observations, patient history, and epidemiological information. The expected result is Negative. Fact Sheet for Patients: PodPark.tn Fact Sheet for Healthcare Providers: GiftContent.is This test is not yet approved or cleared by the [place] FDA and  has been authorized for detection and/or diagnosis of SARS-CoV-2 by FDA under an Emergency Use Authorization (EUA).  This EUA will remain in effect (meaning this test can be used) for the duration of  the COVID-19 de claration under Section 564(b)(1) of the Act, 21 U.S.C. section 360bbb-3(b)(1), unless the authorization is terminated or revoked sooner. Performed at [facility], [address] [place], [place] [address]   SARS CORONAVIRUS 2 (TAT 6-24 HRS) Nasopharyngeal Nasopharyngeal Swab     Status: None   Collection Time: 09/15/19  7:35 PM   Specimen: Nasopharyngeal Swab  Result Value Ref Range Status   SARS Coronavirus 2 NEGATIVE NEGATIVE Final    Comment: (NOTE) SARS-CoV-2 target nucleic acids are NOT DETECTED. The SARS-CoV-2 RNA is generally detectable in upper and lower respiratory specimens during the acute phase of infection. Negative results do not preclude SARS-CoV-2 infection, do not rule out co-infections with other pathogens, and should not be used as the sole basis for treatment or other patient management decisions. Negative results must be combined with clinical observations, patient history, and epidemiological information. The expected result is Negative. Fact Sheet for  Patients: SugarRoll.be Fact Sheet for Healthcare Providers: https://www.woods-mathews.com/ This test is not yet approved or cleared by the [place] FDA and  has been authorized for detection and/or diagnosis of SARS-CoV-2 by FDA under an Emergency Use Authorization (EUA). This EUA will remain  in effect (meaning this test can be used) for the duration of the COVID-19 declaration under Section 56 4(b)(1) of the Act, 21 U.S.C. section 360bbb-3(b)(1), unless the authorization is terminated or revoked sooner. Performed at [facility], [place] [address], [place], [place] [address]   Respiratory Panel by RT PCR (Flu A&B, Covid) - Nasopharyngeal Swab     Status: None   Collection Time: 09/15/19 10:13 PM   Specimen: Nasopharyngeal Swab  Result Value Ref Range Status   SARS Coronavirus 2 by RT PCR NEGATIVE NEGATIVE Final    Comment: (NOTE) SARS-CoV-2 target nucleic acids are NOT DETECTED. The SARS-CoV-2 RNA is generally detectable in upper respiratoy specimens during the acute phase of infection. The lowest concentration of SARS-CoV-2 viral copies this assay can detect is 131 copies/mL. A negative result does not preclude SARS-Cov-2 infection and should not be used as the sole basis for treatment or other patient management decisions. A negative result may occur with  improper specimen collection/handling, submission of specimen other than nasopharyngeal swab, presence of viral mutation(s) within the areas targeted by this assay, and inadequate number of viral copies (<131 copies/mL). A negative result must be combined with clinical observations, patient history, and epidemiological information. The expected result is Negative. Fact Sheet for Patients:  PinkCheek.be Fact Sheet for Healthcare Providers:  GravelBags.it This test is not yet ap proved or cleared by the [place] FDA  and  has been authorized for detection and/or diagnosis of SARS-CoV-2 by FDA under an Emergency Use Authorization (EUA). This EUA will remain  in effect (meaning this test can be used) for the duration of the COVID-19 declaration under Section 564(b)(1) of the Act, 21 U.S.C. section 360bbb-3(b)(1), unless the authorization is terminated or revoked sooner.    Influenza A by PCR NEGATIVE NEGATIVE Final   Influenza B by PCR NEGATIVE NEGATIVE Final    Comment: (NOTE) The Xpert Xpress SARS-CoV-2/FLU/RSV assay is intended as an aid in  the diagnosis of influenza from Nasopharyngeal swab specimens and  should not be used as a sole basis for treatment. Nasal washings and  aspirates are unacceptable for Xpert Xpress SARS-CoV-2/FLU/RSV  testing. Fact Sheet for Patients: PinkCheek.be Fact Sheet for Healthcare Providers: GravelBags.it This test is not yet approved or cleared by the [place] FDA and  has been authorized for detection and/or diagnosis of SARS-CoV-2 by  FDA under an Emergency Use Authorization (EUA). This EUA will remain  in effect (meaning this test can be used) for the duration of the  Covid-19 declaration under Section 564(b)(1) of the Act, 21  U.S.C. section 360bbb-3(b)(1), unless the authorization is  terminated or revoked. Performed at [facility], [place] [address] [place], [place] [address]   MRSA PCR Screening     Status: None   Collection Time: 09/16/19 11:03 AM   Specimen: Nasal Mucosa; Nasopharyngeal  Result Value Ref Range Status   MRSA by PCR NEGATIVE NEGATIVE Final    Comment:        The GeneXpert MRSA Assay (FDA approved for NASAL specimens only), is one component of a comprehensive MRSA colonization surveillance program. It is not intended to diagnose MRSA infection nor to guide or monitor treatment for MRSA infections. Performed at [facility], [place] [address],  [place], [place] [address]   Respiratory Panel by PCR     Status:  None   Collection Time: 09/16/19 12:43 PM   Specimen: Nasopharyngeal Swab; Respiratory  Result Value Ref Range Status   Adenovirus NOT DETECTED NOT DETECTED Final   Coronavirus 229E NOT DETECTED NOT DETECTED Final    Comment: (NOTE) The Coronavirus on the Respiratory Panel, DOES NOT test for the novel  Coronavirus (2019 nCoV)    Coronavirus HKU1 NOT DETECTED NOT DETECTED Final   Coronavirus NL63 NOT DETECTED NOT DETECTED Final   Coronavirus OC43 NOT DETECTED NOT DETECTED Final   Metapneumovirus NOT DETECTED NOT DETECTED Final   Rhinovirus / Enterovirus NOT DETECTED NOT DETECTED Final   Influenza A NOT DETECTED NOT DETECTED Final   Influenza B NOT DETECTED NOT DETECTED Final   Parainfluenza Virus 1 NOT DETECTED NOT DETECTED Final   Parainfluenza Virus 2 NOT DETECTED NOT DETECTED Final   Parainfluenza Virus 3 NOT DETECTED NOT DETECTED Final   Parainfluenza Virus 4 NOT DETECTED NOT DETECTED Final   Respiratory Syncytial Virus NOT DETECTED NOT DETECTED Final   Bordetella pertussis NOT DETECTED NOT DETECTED Final   Chlamydophila pneumoniae NOT DETECTED NOT DETECTED Final   Mycoplasma pneumoniae NOT DETECTED NOT DETECTED Final    Comment: Performed at [facility], [place], [place], [place] [address]  Culture, blood (routine x 2)     Status: None (Preliminary result)   Collection Time: 09/17/19  5:04 PM   Specimen: BLOOD  Result Value Ref Range Status   Specimen Description   Final    BLOOD LEFT ANTECUBITAL Performed at [place] [address] [place], [place] [address]    Special Requests   Final    BOTTLES DRAWN AEROBIC AND ANAEROBIC Blood Culture adequate volume   Culture   Final    NO GROWTH 3 DAYS Performed at [facility], [place] [address] [place], [place] [address]    Report Status PENDING  Incomplete  Culture, blood (routine x 2)     Status: None (Preliminary result)    Collection Time: 09/17/19  5:05 PM   Specimen: BLOOD  Result Value Ref Range Status   Specimen Description   Final    BLOOD RIGHT ANTECUBITAL Performed at [place] [address] [place], [place] [address]    Special Requests   Final    BOTTLES DRAWN AEROBIC AND ANAEROBIC Blood Culture adequate volume   Culture   Final    NO GROWTH 3 DAYS Performed at [facility], [place] [address], [place],  [address]    Report Status PENDING  Incomplete         Radiology Studies: No results found.      Scheduled Meds: . azithromycin  500 mg Oral Daily  . chlorhexidine  15 mL Mouth Rinse BID  . Chlorhexidine Gluconate Cloth  6 each Topical Daily  . enoxaparin (LOVENOX) injection  30 mg Subcutaneous Q24H  . feeding supplement (ENSURE ENLIVE)  237 mL Oral BID WC  . feeding supplement (PRO-STAT SUGAR FREE 64)  30 mL Oral Daily  . magnesium oxide  400 mg Oral TID  . mouth rinse  15 mL Mouth Rinse q12n4p  . metoprolol tartrate  12.5 mg Oral BID  . multivitamin with minerals  1 tablet Oral Daily  . nutrition supplement (JUVEN)  1 packet Oral BID BM   Continuous Infusions: . sodium chloride Stopped (09/16/19 0500)  . sodium chloride       LOS: 5 days    Time spent: 20  minutes with over 50% of the time coordinating the patient's care    Harold Hedge, DO [facility] Pager [phone]  If 7PM-7AM, please contact night-coverage [email or web address] Password [email or web address] 09/20/2019, 1:16 PM

## 2019-09-20 NOTE — Progress Notes (Signed)
Glucose on AM BMP noted to be 69, recheck at bedside was 64. 4oz orange juice given, CBG 20 minutes later was 92.

## 2019-09-20 NOTE — TOC Progression Note (Signed)
Transition of Care [facility]) - Progression Note    Patient Details  Name: Peter Hayes MRN: [id number] Date of Birth: 1995/10/29  Transition of Care [facility]) CM/SW Contact  Joaquin Courts, RN Phone Number: 09/20/2019, 2:43 PM  Clinical Narrative:    CM received consult for Home needs for additional care. CM spoke with patient's father who is the primary care giver. Father states he works full time and is getting a Education officer, community through [employer] for 8 hours everyday. Additionally,  Patient has previously qualified for [employer] services through [facility], unfortunately those services have stopped.  The main concern from patient's father is the need for patient to be lifted from bed to wheelchair and on the days he has to work that is not getting done. 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 One of his son's aides is male and can physically lift the patient, on the other hand the other two male aides are not strong enough to lift patient out of bed safely. Father reports they have all necessary DME at home. CM discussed the potential to use a hoyer lift for transfer but father is concerned that patient spending extended periods of time on the lift pad will cause bed sores. CM discussed the importance of repositioning to decrease prolonged exposure to pressure points as well as ensuring the lift pad does not have creases causing pressure. Father reports he is also working on getting FMLA so he can leave job for an hour at a time so he can come home and transfer his son to the wheelchair. CM discussed the potential to restart pcs services, which the father wanted but felt that he was promised his son would be transferred to wheelchair but that did not happen. Father reports the one time a transfer was attempted, [name] almost fell on the floor and after that services were stopped.  CM encouraged father to call [facility] and discuss the possibility of having the male aide  provide services multiple times a week as that would resolve his primary concern of needed his son to transfer to wheelchair.  CM did speak with [employer] rep, Tommi Rumps, who states he will reach out to father and work with him to see if aide services can be adjusted to help with farher's concerns.      Expected Discharge Plan: [place] Barriers to Discharge: Continued Medical Work up  Expected Discharge Plan and Services Expected Discharge Plan: [place]                                               Social Determinants of Health (SDOH) Interventions    Readmission Risk Interventions No flowsheet data found.

## 2019-09-20 NOTE — Progress Notes (Signed)
Pt placed on dream station set for bipap, ipap 16, epap 4 3LPM bled in.  Switched from Vision for transfer to floor.

## 2019-09-21 LAB — BASIC METABOLIC PANEL
Anion gap: 10 (ref 5–15)
BUN: 5 mg/dL — ABNORMAL LOW (ref 6–20)
CO2: 38 mmol/L — ABNORMAL HIGH (ref 22–32)
Calcium: 9.3 mg/dL (ref 8.9–10.3)
Chloride: 93 mmol/L — ABNORMAL LOW (ref 98–111)
Creatinine, Ser: <0.3 mg/dL — ABNORMAL LOW (ref 0.61–1.24)
Glucose, Bld: 101 mg/dL — ABNORMAL HIGH (ref 70–99)
Potassium: 4.4 mmol/L (ref 3.5–5.1)
Sodium: 141 mmol/L (ref 135–145)

## 2019-09-21 LAB — CBC
HCT: 41 % (ref 39.0–52.0)
Hemoglobin: 12.2 g/dL — ABNORMAL LOW (ref 13.0–17.0)
MCH: 27.6 pg (ref 26.0–34.0)
MCHC: 29.8 g/dL — ABNORMAL LOW (ref 30.0–36.0)
MCV: 92.8 fL (ref 80.0–100.0)
Platelets: 176 10*3/uL (ref 150–400)
RBC: 4.42 MIL/uL (ref 4.22–5.81)
RDW: 14.1 % (ref 11.5–15.5)
WBC: 5.5 10*3/uL (ref 4.0–10.5)
nRBC: 0 % (ref 0.0–0.2)

## 2019-09-21 LAB — MAGNESIUM: Magnesium: 2 mg/dL (ref 1.7–2.4)

## 2019-09-21 MED ORDER — IPRATROPIUM-ALBUTEROL 0.5-2.5 (3) MG/3ML IN SOLN: 3.0000 mL | Freq: Four times a day (QID) | RESPIRATORY_TRACT | 0 refills | Status: AC | PRN

## 2019-09-21 MED ORDER — METOPROLOL TARTRATE 25 MG PO TABS: 12.5000 mg | ORAL_TABLET | Freq: Three times a day (TID) | ORAL | 0 refills | Status: DC

## 2019-09-21 MED ORDER — GUAIFENESIN ER 600 MG PO TB12: 600.0000 mg | ORAL_TABLET | Freq: Two times a day (BID) | ORAL | 0 refills | Status: AC | PRN

## 2019-09-21 NOTE — Progress Notes (Signed)
Called to room by father stating that he, the father, does not like the dream station machine.  He doesn't feel like it is working for his son the way he wants it to work.  Extensive education on machine and settings were done prior to placement on dream station with nursing present.  Patient placed back on Vision as requested.  Dream station working appropriately with audible flow heard from machine.

## 2019-09-21 NOTE — Progress Notes (Signed)
Patient for d/c later today.  Father request that nursing to continue to hold on assessment until patient awake for the day.  Asking that we continue his home routine.  VSS, MD aware

## 2019-09-21 NOTE — Discharge Summary (Addendum)
Physician Discharge Summary  Peter Hayes HWK:[id number] DOB: Feb 18, 1996 DOA: 09/15/2019  PCP: Penni Bombard, PA  Admit date: 09/15/2019 Discharge date: 09/21/2019   Code Status: Full Code  Admitted From: Home Discharged to: [place]: No Equipment/Devices:no Discharge Condition: Stable  Recommendations for Outpatient Follow-up   1. Follow up with PCP in 1 week 2. Please follow up BMP/CBC  3. Patient needs follow-up with his muscular dystrophy specialist.  Concerned that he may be having progression of his disease and may have recurrent aspiration events in the future  Hospital Summary  This is a 23 year old male with past medical history of Duchenne muscular dystrophy, recurrent sigmoid volvulus, chronic hypoxic respiratory failure on 1 L oxygen daily and noninvasive ventilation nightly, iron deficiency anemia who presented with shortness of breath.  Patient began to feel congested and bringing up mucus over the past several days but on 12/8 began to be more short of breath.  He was in [facility] ER and spiked a fever to 100.4 and was on BiPAP before transitioning to nasal cannula and transferred to [facility] 12/9 stepdown unit.  Covid negative.  Viral panel negative.  Started on treatment for CAP.  Patient completed 5 days CAP therapy with resolution of symptoms and without any signs of infection.  While hospitalized his blood pressure was on the lower side continue to have tachycardia.  He was on metoprolol 25 mg 3 times daily at home.  This was decreased to 12.5 mg 3 times daily and patient had improvement in pressure and rates.  Discharged on this regimen.  A & P   Active Problems:   Acute respiratory failure with hypoxia and hypercapnia (HCC)   Severe sepsis (HCC)   Pressure injury of skin   Acute on chronic hypoxic and hypercapnic respiratory failure secondary to suspected community-acquired pneumonia in setting of Duchenne muscular dystrophy  improved.  Strep pneumo  urine antigen and viral panel negative.  Completed 5 days ceftriaxone/azithromycin.  Chest x-ray with poor inspiratory effort and without acute abnormality on 12/8.  Repeat chest x-ray 12/11 unremarkable.  Typically on 1 L nasal cannula at baseline and noninvasive ventilation nightly at home, tolerating BiPAP nightly and 4 L nasal cannula during the day currently.  Wheeze resolved. Cultures with no growth to date.  -Advised to continue using 4 L nasal cannula at home and titrate down to baseline 1 L as necessary. -Follow-up with PCP within the week -Concerned he may be having progression of his Duchenne muscular dystrophy and needs follow-up with specialist outpatient -Mucinex at discharge for sputum -Instructions on aspiration precautions -DuoNeb q6h prn  Anemia of chronic disease anemia stable. patient is a Restaurant manager, fast food.    Nausea without vomiting Resolved  Metabolic alkalosis possibly from GI losses Stable.  Follow-up outpatient  Hypokalemia, likely from GI losses as above  resolved  Duchenne muscular dystrophy Stable -Patient should follow-up with his primary care and specialist once discharged as he may be having progression of his underlying disease  Iron deficiency anemia  -On iron supplement outpatient  Tachycardia takes metoprolol 25 mg 3 times daily outpatient however has had episodes of hypotension.  Metoprolol was changed to 12.5 mg twice daily with improved rates and pressure  -Continue lower dose beta-blocker at discharge  Hypotension has borderline low blood pressures at baseline Improved with regimen as above.  Underweight -Continue outpatient supplements     Consultants  . None  Procedures  . None  Antibiotics   Anti-infectives (From admission, onward)  Start     Dose/Rate Route Frequency Ordered Stop   09/18/19 1000  azithromycin (ZITHROMAX) tablet 500 mg     500 mg Oral Daily 09/17/19 1122 09/20/19 1325   09/16/19 0500  cefTRIAXone  (ROCEPHIN) 2 g in sodium chloride 0.9 % 100 mL IVPB     2 g 200 mL/hr over 30 Minutes Intravenous Every 24 hours 09/16/19 0458 09/20/19 0530   09/16/19 0500  azithromycin (ZITHROMAX) 500 mg in sodium chloride 0.9 % 250 mL IVPB  Status:  Discontinued     500 mg 250 mL/hr over 60 Minutes Intravenous Every 24 hours 09/16/19 0458 09/17/19 1122        Subjective  Patient seen and examined at bedside no acute distress resting comfortably.  States he feels much better today.  Denies any nausea, chest pain, shortness of breath.  Admits to some mucus production.  Otherwise denies any complaints.  Full ROS negative   Objective   Discharge Exam: Vitals:   09/21/19 1200 09/21/19 1400  BP: (!) 102/44 (!) 168/90  Pulse: (!) 101 (!) 133  Resp: 10 (!) 40  Temp:    SpO2: 100% 97%   Vitals:   09/21/19 0800 09/21/19 1000 09/21/19 1200 09/21/19 1400  BP: (!) 92/45 (!) 99/34 (!) 102/44 (!) 168/90  Pulse: 91 93 (!) 101 (!) 133  Resp: (!) 23 (!) 23 10 (!) 40  Temp:      TempSrc:      SpO2: 99% 100% 100% 97%  Weight:      Height:        Physical Exam Vitals and nursing note reviewed. Exam conducted with a chaperone present.  Constitutional:      Comments: Appears chronically ill  HENT:     Head: Normocephalic and atraumatic.     Nose: Nose normal.     Mouth/Throat:     Mouth: Mucous membranes are moist.  Eyes:     Extraocular Movements: Extraocular movements intact.  Cardiovascular:     Rate and Rhythm: Normal rate and regular rhythm.  Pulmonary:     Effort: Pulmonary effort is normal.     Breath sounds: Normal breath sounds.     Comments: With BiPAP on this a.m. Abdominal:     General: Abdomen is flat.     Palpations: Abdomen is soft.  Musculoskeletal:        General: No swelling or tenderness.     Cervical back: Normal range of motion. No rigidity.  Neurological:     Mental Status: He is alert. Mental status is at baseline.  Psychiatric:        Mood and Affect: Mood  normal.        Behavior: Behavior normal.       The results of significant diagnostics from this hospitalization (including imaging, microbiology, ancillary and laboratory) are listed below for reference.     Microbiology: Recent Results (from the past 240 hour(s))  SARS Coronavirus 2 Ag (30 min TAT) - Nasal Swab (BD Veritor Kit)     Status: None   Collection Time: 09/15/19  4:15 PM   Specimen: Nasal Swab (BD Veritor Kit)  Result Value Ref Range Status   SARS Coronavirus 2 Ag NEGATIVE NEGATIVE Final    Comment: (NOTE) SARS-CoV-2 antigen NOT DETECTED.  Negative results are presumptive.  Negative results do not preclude SARS-CoV-2 infection and should not be used as the sole basis for treatment or other patient management decisions, including infection  control decisions, particularly in the  presence of clinical signs and  symptoms consistent with COVID-19, or in those who have been in contact with the virus.  Negative results must be combined with clinical observations, patient history, and epidemiological information. The expected result is Negative. Fact Sheet for Patients: PodPark.tn Fact Sheet for Healthcare Providers: GiftContent.is This test is not yet approved or cleared by the [place] FDA and  has been authorized for detection and/or diagnosis of SARS-CoV-2 by FDA under an Emergency Use Authorization (EUA).  This EUA will remain in effect (meaning this test can be used) for the duration of  the COVID-19 de claration under Section 564(b)(1) of the Act, 21 U.S.C. section 360bbb-3(b)(1), unless the authorization is terminated or revoked sooner. Performed at [facility], [address] [place], [place] [address]   SARS CORONAVIRUS 2 (TAT 6-24 HRS) Nasopharyngeal Nasopharyngeal Swab     Status: None   Collection Time: 09/15/19  7:35 PM   Specimen: Nasopharyngeal Swab  Result Value Ref Range  Status   SARS Coronavirus 2 NEGATIVE NEGATIVE Final    Comment: (NOTE) SARS-CoV-2 target nucleic acids are NOT DETECTED. The SARS-CoV-2 RNA is generally detectable in upper and lower respiratory specimens during the acute phase of infection. Negative results do not preclude SARS-CoV-2 infection, do not rule out co-infections with other pathogens, and should not be used as the sole basis for treatment or other patient management decisions. Negative results must be combined with clinical observations, patient history, and epidemiological information. The expected result is Negative. Fact Sheet for Patients: SugarRoll.be Fact Sheet for Healthcare Providers: https://www.woods-mathews.com/ This test is not yet approved or cleared by the [place] FDA and  has been authorized for detection and/or diagnosis of SARS-CoV-2 by FDA under an Emergency Use Authorization (EUA). This EUA will remain  in effect (meaning this test can be used) for the duration of the COVID-19 declaration under Section 56 4(b)(1) of the Act, 21 U.S.C. section 360bbb-3(b)(1), unless the authorization is terminated or revoked sooner. Performed at [facility], [place] [employer] [address] [place], [place] [address]   Respiratory Panel by RT PCR (Flu A&B, Covid) - Nasopharyngeal Swab     Status: None   Collection Time: 09/15/19 10:13 PM   Specimen: Nasopharyngeal Swab  Result Value Ref Range Status   SARS Coronavirus 2 by RT PCR NEGATIVE NEGATIVE Final    Comment: (NOTE) SARS-CoV-2 target nucleic acids are NOT DETECTED. The SARS-CoV-2 RNA is generally detectable in upper respiratoy specimens during the acute phase of infection. The lowest concentration of SARS-CoV-2 viral copies this assay can detect is 131 copies/mL. A negative result does not preclude SARS-Cov-2 infection and should not be used as the sole basis for treatment or other patient management decisions. A negative  result may occur with  improper specimen collection/handling, submission of specimen other than nasopharyngeal swab, presence of viral mutation(s) within the areas targeted by this assay, and inadequate number of viral copies (<131 copies/mL). A negative result must be combined with clinical observations, patient history, and epidemiological information. The expected result is Negative. Fact Sheet for Patients:  PinkCheek.be Fact Sheet for Healthcare Providers:  GravelBags.it This test is not yet ap proved or cleared by the [place] FDA and  has been authorized for detection and/or diagnosis of SARS-CoV-2 by FDA under an Emergency Use Authorization (EUA). This EUA will remain  in effect (meaning this test can be used) for the duration of the COVID-19 declaration under Section 564(b)(1) of the Act, 21 U.S.C.  section 360bbb-3(b)(1), unless the authorization is terminated or revoked sooner.    Influenza A by PCR NEGATIVE NEGATIVE Final   Influenza B by PCR NEGATIVE NEGATIVE Final    Comment: (NOTE) The Xpert Xpress SARS-CoV-2/FLU/RSV assay is intended as an aid in  the diagnosis of influenza from Nasopharyngeal swab specimens and  should not be used as a sole basis for treatment. Nasal washings and  aspirates are unacceptable for Xpert Xpress SARS-CoV-2/FLU/RSV  testing. Fact Sheet for Patients: PinkCheek.be Fact Sheet for Healthcare Providers: GravelBags.it This test is not yet approved or cleared by the [place] FDA and  has been authorized for detection and/or diagnosis of SARS-CoV-2 by  FDA under an Emergency Use Authorization (EUA). This EUA will remain  in effect (meaning this test can be used) for the duration of the  Covid-19 declaration under Section 564(b)(1) of the Act, 21  U.S.C. section 360bbb-3(b)(1), unless the authorization is  terminated or  revoked. Performed at [facility], [place] [address], [place], [place] [address]   MRSA PCR Screening     Status: None   Collection Time: 09/16/19 11:03 AM   Specimen: Nasal Mucosa; Nasopharyngeal  Result Value Ref Range Status   MRSA by PCR NEGATIVE NEGATIVE Final    Comment:        The GeneXpert MRSA Assay (FDA approved for NASAL specimens only), is one component of a comprehensive MRSA colonization surveillance program. It is not intended to diagnose MRSA infection nor to guide or monitor treatment for MRSA infections. Performed at [facility], [place] [address], [place], [place] [address]   Respiratory Panel by PCR     Status: None   Collection Time: 09/16/19 12:43 PM   Specimen: Nasopharyngeal Swab; Respiratory  Result Value Ref Range Status   Adenovirus NOT DETECTED NOT DETECTED Final   Coronavirus 229E NOT DETECTED NOT DETECTED Final    Comment: (NOTE) The Coronavirus on the Respiratory Panel, DOES NOT test for the novel  Coronavirus (2019 nCoV)    Coronavirus HKU1 NOT DETECTED NOT DETECTED Final   Coronavirus NL63 NOT DETECTED NOT DETECTED Final   Coronavirus OC43 NOT DETECTED NOT DETECTED Final   Metapneumovirus NOT DETECTED NOT DETECTED Final   Rhinovirus / Enterovirus NOT DETECTED NOT DETECTED Final   Influenza A NOT DETECTED NOT DETECTED Final   Influenza B NOT DETECTED NOT DETECTED Final   Parainfluenza Virus 1 NOT DETECTED NOT DETECTED Final   Parainfluenza Virus 2 NOT DETECTED NOT DETECTED Final   Parainfluenza Virus 3 NOT DETECTED NOT DETECTED Final   Parainfluenza Virus 4 NOT DETECTED NOT DETECTED Final   Respiratory Syncytial Virus NOT DETECTED NOT DETECTED Final   Bordetella pertussis NOT DETECTED NOT DETECTED Final   Chlamydophila pneumoniae NOT DETECTED NOT DETECTED Final   Mycoplasma pneumoniae NOT DETECTED NOT DETECTED Final    Comment: Performed at [facility], [place], [place], [place] [address]  Culture, blood  (routine x 2)     Status: None (Preliminary result)   Collection Time: 09/17/19  5:04 PM   Specimen: BLOOD  Result Value Ref Range Status   Specimen Description   Final    BLOOD LEFT ANTECUBITAL Performed at [place] [address] [place], [place] [address]    Special Requests   Final    BOTTLES DRAWN AEROBIC AND ANAEROBIC Blood Culture adequate volume   Culture   Final    NO GROWTH 4 DAYS Performed at [facility], [address], [place], [place] [address]    Report Status PENDING  Incomplete  Culture, blood (routine x 2)     Status: None (Preliminary result)   Collection Time: 09/17/19  5:05 PM   Specimen: BLOOD  Result Value Ref Range Status   Specimen Description   Final    BLOOD RIGHT ANTECUBITAL Performed at [place] [address] [place], [place] [address]    Special Requests   Final    BOTTLES DRAWN AEROBIC AND ANAEROBIC Blood Culture adequate volume   Culture   Final    NO GROWTH 4 DAYS Performed at [facility], [place] [address], [place], [place] [address]    Report Status PENDING  Incomplete     Labs: BNP (last 3 results) No results for input(s): BNP in the last 8760 hours. Basic Metabolic Panel: Recent Labs  Lab 09/17/19 0239 09/18/19 0241 09/19/19 0211 09/20/19 0722 09/21/19 0241  NA 137 140 142 141 141  K 3.9 3.2* 3.5 3.3* 4.4  CL 99 99 97* 92* 93*  CO2 28 30 34* 38* 38*  GLUCOSE 122* 94 93 69* 101*  BUN <5* <5* <5* <5* 5*  CREATININE <0.30* <0.30* <0.30* <0.30* <0.30*  CALCIUM 8.3* 8.3* 8.6* 8.7* 9.3  MG  --  1.8  --   --  2.0   Liver Function Tests: No results for input(s): AST, ALT, ALKPHOS, BILITOT, PROT, ALBUMIN in the last 168 hours. No results for input(s): LIPASE, AMYLASE in the last 168 hours. No results for input(s): AMMONIA in the last 168 hours. CBC: Recent Labs  Lab 09/15/19 1615 09/15/19 1903 09/16/19 1347 09/17/19 0239 09/18/19 0241 09/19/19 0211 09/20/19 0722  09/21/19 0241  WBC 18.4*   < > 6.6 8.9 6.0 5.5 3.7* 5.5  NEUTROABS 14.2*  --  4.6  --   --   --   --   --   HGB 15.4  --  11.6* 10.8* 11.5* 11.5* 11.5* 12.2*  HCT 49.1  --  38.5* 35.9* 38.1* 37.7* 37.8* 41.0  MCV 88.8   < > 92.5 92.1 93.4 92.0 93.1 92.8  PLT 283   < > 113* 133* 102* 124* 129* 176   < > = values in this interval not displayed.   Cardiac Enzymes: No results for input(s): CKTOTAL, CKMB, CKMBINDEX, TROPONINI in the last 168 hours. BNP: Invalid input(s): POCBNP CBG: Recent Labs  Lab 09/20/19 0950 09/20/19 1025  GLUCAP 64* 92   D-Dimer No results for input(s): DDIMER in the last 72 hours. Hgb A1c No results for input(s): HGBA1C in the last 72 hours. Lipid Profile No results for input(s): CHOL, HDL, LDLCALC, TRIG, CHOLHDL, LDLDIRECT in the last 72 hours. Thyroid function studies No results for input(s): TSH, T4TOTAL, T3FREE, THYROIDAB in the last 72 hours.  Invalid input(s): FREET3 Anemia work up Recent Labs    09/19/19 1156  FERRITIN 90  TIBC 214*  IRON 57   Urinalysis    Component Value Date/Time   COLORURINE YELLOW 09/15/2019 2257   APPEARANCEUR CLOUDY (A) 09/15/2019 2257   LABSPEC 1.020 09/15/2019 2257   PHURINE 7.5 09/15/2019 2257   GLUCOSEU 100 (A) 09/15/2019 2257   HGBUR NEGATIVE 09/15/2019 2257   BILIRUBINUR NEGATIVE 09/15/2019 2257   KETONESUR >80 (A) 09/15/2019 2257   PROTEINUR NEGATIVE 09/15/2019 2257   NITRITE NEGATIVE 09/15/2019 2257   LEUKOCYTESUR NEGATIVE 09/15/2019 2257   Sepsis Labs Invalid input(s): PROCALCITONIN,  WBC,  LACTICIDVEN Microbiology Recent Results (from  the past 240 hour(s))  SARS Coronavirus 2 Ag (30 min TAT) - Nasal Swab (BD Veritor Kit)     Status: None   Collection Time: 09/15/19  4:15 PM   Specimen: Nasal Swab (BD Veritor Kit)  Result Value Ref Range Status   SARS Coronavirus 2 Ag NEGATIVE NEGATIVE Final    Comment: (NOTE) SARS-CoV-2 antigen NOT DETECTED.  Negative results are presumptive.  Negative results  do not preclude SARS-CoV-2 infection and should not be used as the sole basis for treatment or other patient management decisions, including infection  control decisions, particularly in the presence of clinical signs and  symptoms consistent with COVID-19, or in those who have been in contact with the virus.  Negative results must be combined with clinical observations, patient history, and epidemiological information. The expected result is Negative. Fact Sheet for Patients: PodPark.tn Fact Sheet for Healthcare Providers: GiftContent.is This test is not yet approved or cleared by the [place] FDA and  has been authorized for detection and/or diagnosis of SARS-CoV-2 by FDA under an Emergency Use Authorization (EUA).  This EUA will remain in effect (meaning this test can be used) for the duration of  the COVID-19 de claration under Section 564(b)(1) of the Act, 21 U.S.C. section 360bbb-3(b)(1), unless the authorization is terminated or revoked sooner. Performed at [facility], [address] [place], [place] [address]   SARS CORONAVIRUS 2 (TAT 6-24 HRS) Nasopharyngeal Nasopharyngeal Swab     Status: None   Collection Time: 09/15/19  7:35 PM   Specimen: Nasopharyngeal Swab  Result Value Ref Range Status   SARS Coronavirus 2 NEGATIVE NEGATIVE Final    Comment: (NOTE) SARS-CoV-2 target nucleic acids are NOT DETECTED. The SARS-CoV-2 RNA is generally detectable in upper and lower respiratory specimens during the acute phase of infection. Negative results do not preclude SARS-CoV-2 infection, do not rule out co-infections with other pathogens, and should not be used as the sole basis for treatment or other patient management decisions. Negative results must be combined with clinical observations, patient history, and epidemiological information. The expected result is Negative. Fact Sheet for  Patients: SugarRoll.be Fact Sheet for Healthcare Providers: https://www.woods-mathews.com/ This test is not yet approved or cleared by the [place] FDA and  has been authorized for detection and/or diagnosis of SARS-CoV-2 by FDA under an Emergency Use Authorization (EUA). This EUA will remain  in effect (meaning this test can be used) for the duration of the COVID-19 declaration under Section 56 4(b)(1) of the Act, 21 U.S.C. section 360bbb-3(b)(1), unless the authorization is terminated or revoked sooner. Performed at [facility], [place] [address], [place], [place] [address]   Respiratory Panel by RT PCR (Flu A&B, Covid) - Nasopharyngeal Swab     Status: None   Collection Time: 09/15/19 10:13 PM   Specimen: Nasopharyngeal Swab  Result Value Ref Range Status   SARS Coronavirus 2 by RT PCR NEGATIVE NEGATIVE Final    Comment: (NOTE) SARS-CoV-2 target nucleic acids are NOT DETECTED. The SARS-CoV-2 RNA is generally detectable in upper respiratoy specimens during the acute phase of infection. The lowest concentration of SARS-CoV-2 viral copies this assay can detect is 131 copies/mL. A negative result does not preclude SARS-Cov-2 infection and should not be used as the sole basis for treatment or other patient management decisions. A negative result may occur with  improper specimen collection/handling, submission of specimen other than nasopharyngeal swab, presence of viral mutation(s) within the areas targeted by this assay,  and inadequate number of viral copies (<131 copies/mL). A negative result must be combined with clinical observations, patient history, and epidemiological information. The expected result is Negative. Fact Sheet for Patients:  PinkCheek.be Fact Sheet for Healthcare Providers:  GravelBags.it This test is not yet ap proved or cleared by the [place] FDA  and  has been authorized for detection and/or diagnosis of SARS-CoV-2 by FDA under an Emergency Use Authorization (EUA). This EUA will remain  in effect (meaning this test can be used) for the duration of the COVID-19 declaration under Section 564(b)(1) of the Act, 21 U.S.C. section 360bbb-3(b)(1), unless the authorization is terminated or revoked sooner.    Influenza A by PCR NEGATIVE NEGATIVE Final   Influenza B by PCR NEGATIVE NEGATIVE Final    Comment: (NOTE) The Xpert Xpress SARS-CoV-2/FLU/RSV assay is intended as an aid in  the diagnosis of influenza from Nasopharyngeal swab specimens and  should not be used as a sole basis for treatment. Nasal washings and  aspirates are unacceptable for Xpert Xpress SARS-CoV-2/FLU/RSV  testing. Fact Sheet for Patients: PinkCheek.be Fact Sheet for Healthcare Providers: GravelBags.it This test is not yet approved or cleared by the [place] FDA and  has been authorized for detection and/or diagnosis of SARS-CoV-2 by  FDA under an Emergency Use Authorization (EUA). This EUA will remain  in effect (meaning this test can be used) for the duration of the  Covid-19 declaration under Section 564(b)(1) of the Act, 21  U.S.C. section 360bbb-3(b)(1), unless the authorization is  terminated or revoked. Performed at [facility], [place] [address], [place], [place] [address]   MRSA PCR Screening     Status: None   Collection Time: 09/16/19 11:03 AM   Specimen: Nasal Mucosa; Nasopharyngeal  Result Value Ref Range Status   MRSA by PCR NEGATIVE NEGATIVE Final    Comment:        The GeneXpert MRSA Assay (FDA approved for NASAL specimens only), is one component of a comprehensive MRSA colonization surveillance program. It is not intended to diagnose MRSA infection nor to guide or monitor treatment for MRSA infections. Performed at [facility], [place] [address]  [place], [place] [address]   Respiratory Panel by PCR     Status: None   Collection Time: 09/16/19 12:43 PM   Specimen: Nasopharyngeal Swab; Respiratory  Result Value Ref Range Status   Adenovirus NOT DETECTED NOT DETECTED Final   Coronavirus 229E NOT DETECTED NOT DETECTED Final    Comment: (NOTE) The Coronavirus on the Respiratory Panel, DOES NOT test for the novel  Coronavirus (2019 nCoV)    Coronavirus HKU1 NOT DETECTED NOT DETECTED Final   Coronavirus NL63 NOT DETECTED NOT DETECTED Final   Coronavirus OC43 NOT DETECTED NOT DETECTED Final   Metapneumovirus NOT DETECTED NOT DETECTED Final   Rhinovirus / Enterovirus NOT DETECTED NOT DETECTED Final   Influenza A NOT DETECTED NOT DETECTED Final   Influenza B NOT DETECTED NOT DETECTED Final   Parainfluenza Virus 1 NOT DETECTED NOT DETECTED Final   Parainfluenza Virus 2 NOT DETECTED NOT DETECTED Final   Parainfluenza Virus 3 NOT DETECTED NOT DETECTED Final   Parainfluenza Virus 4 NOT DETECTED NOT DETECTED Final   Respiratory Syncytial Virus NOT DETECTED NOT DETECTED Final   Bordetella pertussis NOT DETECTED NOT DETECTED Final   Chlamydophila pneumoniae NOT DETECTED NOT DETECTED Final   Mycoplasma pneumoniae NOT DETECTED NOT DETECTED Final    Comment: Performed at [facility], [place].  [address], [place], [place] [address]  Culture, blood (routine x 2)     Status: None (Preliminary result)   Collection Time: 09/17/19  5:04 PM   Specimen: BLOOD  Result Value Ref Range Status   Specimen Description   Final    BLOOD LEFT ANTECUBITAL Performed at [facility] [address] [place], [place] [address]    Special Requests   Final    BOTTLES DRAWN AEROBIC AND ANAEROBIC Blood Culture adequate volume   Culture   Final    NO GROWTH 4 DAYS Performed at [facility], [place] [address], [place], [place] [address]    Report Status PENDING  Incomplete  Culture, blood (routine x 2)     Status: None (Preliminary result)    Collection Time: 09/17/19  5:05 PM   Specimen: BLOOD  Result Value Ref Range Status   Specimen Description   Final    BLOOD RIGHT ANTECUBITAL Performed at [facility] [address], [place], [place] [address]    Special Requests   Final    BOTTLES DRAWN AEROBIC AND ANAEROBIC Blood Culture adequate volume   Culture   Final    NO GROWTH 4 DAYS Performed at [facility], [place] [address] [place], [place] [address]    Report Status PENDING  Incomplete    Discharge Instructions     Discharge Instructions    Discharge instructions   Complete by: As directed    You were seen and examined in the hospital for community acquired pneumonia and cared for by a hospitalist.   Upon Discharge:  -You may need to use higher doses of oxygen for the time being at home.  You were on 4 L nasal cannula in the hospital -Your metoprolol dose was decreased from 25 mg 3 times daily to 12.5 mg 3 times daily -Take Mucinex twice daily as needed for cough or mucus/phlegm -Get lab work at the end of this week and follow-up with your primary care physician within 1 week -Follow-up with your physician who manages your muscular dystrophy -Sit upright throughout the day as best as possible Make an appointment with your primary care physician within 7 days Get lab work prior to your follow up appointment with your PCP Bring all home medications to your appointment to review Request that your primary physician go over all hospital tests and procedures/radiological results at the follow up.   Please get all hospital records sent to your physician by signing a hospital release before you go home.     Read the complete instructions along with all the possible side effects for all the medicines you take and that have been prescribed to you. Take any new medicines after you have completely understood and accept all the possible adverse reactions/side effects.   If you have any questions  about your discharge medications or the care you received while you were in the hospital, you can call the unit and asked to speak with the hospitalist on call. Once you are discharged, your primary care physician will handle any further medical issues. Please note that NO REFILLS for any discharge medications will be authorized, as it is imperative that you return to your primary care physician (or establish a relationship with a primary care physician if you do not have one) for your aftercare needs so that they can reassess your need for medications and monitor your lab values.   Do not drive, operate heavy machinery, perform activities  at heights, swimming or participation in water activities or provide baby sitting services if your were admitted for loss of consciousness/seizures or if you are on sedating medications including, but not limited to benzodiazepines, sleep medications, narcotic pain medications, etc., until you have been cleared to do so by a medical doctor.   Do not take more than prescribed medications.   Wear a seat belt while driving.  If you have smoked or chewed Tobacco in the last 2 years please stop smoking; also stop any regular Alcohol and/or any Recreational drug use including marijuana.  If you experience worsening of your admission symptoms or develop shortness of breath, chest pain, suicidal or homicidal thoughts or experience a life threatening emergency, you must seek medical attention immediately by calling 911 or calling your PCP immediately.   Increase activity slowly   Complete by: As directed      Allergies as of 09/21/2019      Reactions   Fentanyl Other (See Comments)   Dizziness      Medication List    TAKE these medications   acetaminophen 325 MG tablet Commonly known as: TYLENOL Take 2 tablets (650 mg total) by mouth every 6 (six) hours as needed for mild pain, moderate pain, fever or headache (or temp > 100).   feeding supplement (ENSURE  ENLIVE) Liqd You can use whatever supplement he likes.  He needs about 1300 calories per day. 1.5 liters of fluid and 50 grams of protein per day. You can buy this at the grocery or drug store.  You do not need a prescription. What changed:   how much to take  how to take this  when to take this   ferrous sulfate 220 (44 Fe) MG/5ML solution Take 220 mg by mouth daily.   guaiFENesin 600 MG 12 hr tablet Commonly known as: Mucinex Take 1 tablet (600 mg total) by mouth 2 (two) times daily as needed for cough or to loosen phlegm.   ipratropium-albuterol 0.5-2.5 (3) MG/3ML Soln Commonly known as: DUONEB Take 3 mLs by nebulization every 6 (six) hours as needed.   magnesium oxide 400 MG tablet Commonly known as: MAG-OX Take 400 mg by mouth 3 (three) times daily. Notes to patient: Next dose 12/14 evening   metoprolol tartrate 25 MG tablet Commonly known as: LOPRESSOR Take 0.5 tablets (12.5 mg total) by mouth 3 (three) times daily. What changed: how much to take   multivitamin with minerals Tabs tablet You can get him a general multivitamin for daily use at any drug store What changed:   how much to take  how to take this  when to take this  additional instructions Notes to patient: Next dose 12/15 AM   polyethylene glycol 17 g packet Commonly known as: MIRALAX / GLYCOLAX Follow package instruction for daily use.  You need to have one soft bowel movement per day.  If he is not doing this call your primary care doctor.  You also need to be sure he takes in 1.5 liters of fluid per day.   He needs protein supplement daily, and needs to take in about 1300 calories per day. What changed:   how much to take  how to take this  when to take this   PRUNE SENNA CONCENTRATE PO Take 8 oz by mouth daily as needed (constipation).   simethicone 80 MG chewable tablet Commonly known as: Gas-X Chew 1 tablet (80 mg total) by mouth every 6 (six) hours as needed for flatulence  (  distention).       Allergies  Allergen Reactions  . Fentanyl Other (See Comments)    Dizziness    Time coordinating discharge: Over 30 minutes   SIGNED:   Harold Hedge, D.O. [facility] Pager: [phone]  09/21/2019, 2:54 PM

## 2019-09-22 LAB — CULTURE, BLOOD (ROUTINE X 2)
Culture: NO GROWTH
Culture: NO GROWTH
Special Requests: ADEQUATE
Special Requests: ADEQUATE

## 2019-11-01 ENCOUNTER — Encounter (HOSPITAL_BASED_OUTPATIENT_CLINIC_OR_DEPARTMENT_OTHER): Payer: Self-pay | Admitting: Emergency Medicine

## 2019-11-01 ENCOUNTER — Emergency Department (HOSPITAL_BASED_OUTPATIENT_CLINIC_OR_DEPARTMENT_OTHER): Payer: Managed Care, Other (non HMO)

## 2019-11-01 ENCOUNTER — Emergency Department (HOSPITAL_BASED_OUTPATIENT_CLINIC_OR_DEPARTMENT_OTHER)
Admission: EM | Admit: 2019-11-01 | Discharge: 2019-11-01 | Disposition: A | Discharge status: Home / Self Care | Payer: Managed Care, Other (non HMO) | Attending: Emergency Medicine | Admitting: Emergency Medicine

## 2019-11-01 ENCOUNTER — Other Ambulatory Visit: Payer: Self-pay

## 2019-11-01 DIAGNOSIS — Z20822 Contact with and (suspected) exposure to covid-19: Secondary | ICD-10-CM | POA: Diagnosis not present

## 2019-11-01 DIAGNOSIS — Z8669 Personal history of other diseases of the nervous system and sense organs: Secondary | ICD-10-CM | POA: Diagnosis not present

## 2019-11-01 DIAGNOSIS — Z79899 Other long term (current) drug therapy: Secondary | ICD-10-CM | POA: Diagnosis not present

## 2019-11-01 DIAGNOSIS — R0600 Dyspnea, unspecified: Secondary | ICD-10-CM

## 2019-11-01 DIAGNOSIS — Z9981 Dependence on supplemental oxygen: Secondary | ICD-10-CM | POA: Insufficient documentation

## 2019-11-01 DIAGNOSIS — R0602 Shortness of breath: Secondary | ICD-10-CM | POA: Diagnosis present

## 2019-11-01 LAB — CBC WITH DIFFERENTIAL/PLATELET
Abs Immature Granulocytes: 0 10*3/uL (ref 0.00–0.07)
Basophils Absolute: 0 10*3/uL (ref 0.0–0.1)
Basophils Relative: 0 %
Eosinophils Absolute: 0.1 10*3/uL (ref 0.0–0.5)
Eosinophils Relative: 1 %
HCT: 44.4 % (ref 39.0–52.0)
Hemoglobin: 13.6 g/dL (ref 13.0–17.0)
Immature Granulocytes: 0 %
Lymphocytes Relative: 30 %
Lymphs Abs: 2.1 10*3/uL (ref 0.7–4.0)
MCH: 28.2 pg (ref 26.0–34.0)
MCHC: 30.6 g/dL (ref 30.0–36.0)
MCV: 92.1 fL (ref 80.0–100.0)
Monocytes Absolute: 0.9 10*3/uL (ref 0.1–1.0)
Monocytes Relative: 13 %
Neutro Abs: 3.9 10*3/uL (ref 1.7–7.7)
Neutrophils Relative %: 56 %
Platelets: 202 10*3/uL (ref 150–400)
RBC: 4.82 MIL/uL (ref 4.22–5.81)
RDW: 13.5 % (ref 11.5–15.5)
WBC: 7 10*3/uL (ref 4.0–10.5)
nRBC: 0 % (ref 0.0–0.2)

## 2019-11-01 LAB — BASIC METABOLIC PANEL
Anion gap: 8 (ref 5–15)
BUN: 13 mg/dL (ref 6–20)
CO2: 33 mmol/L — ABNORMAL HIGH (ref 22–32)
Calcium: 9.3 mg/dL (ref 8.9–10.3)
Chloride: 99 mmol/L (ref 98–111)
Creatinine, Ser: <0.3 mg/dL — ABNORMAL LOW (ref 0.61–1.24)
Glucose, Bld: 92 mg/dL (ref 70–99)
Potassium: 3.9 mmol/L (ref 3.5–5.1)
Sodium: 140 mmol/L (ref 135–145)

## 2019-11-01 LAB — SARS CORONAVIRUS 2 BY RT PCR (HOSPITAL ORDER, PERFORMED IN ~~LOC~~ HOSPITAL LAB): SARS Coronavirus 2: NEGATIVE

## 2019-11-01 NOTE — ED Notes (Signed)
ED Provider at bedside at pt/family request.

## 2019-11-01 NOTE — Discharge Instructions (Addendum)
Use Mucinex over-the-counter as per package instructions as needed for congestion.  Follow-up with your primary doctor/pulmonologist, and return to the ER if symptoms worsen or change.

## 2019-11-01 NOTE — ED Triage Notes (Signed)
Pts father states that earlier tonight pt began coughing and feels that he has mucous in his throat. Due to his other health conditions they were concerned about the phlem as it is hard for pt to cough this out. Pt sats 100% on 1L with his personal bipap, in NAD. EDP at bedside on pts arrival to room.

## 2019-11-01 NOTE — ED Notes (Signed)
Patient complaining of secretions in throat not being able to cough. Patient orally suctioned with small amount of white secretions. Patient tolerated well. Patient in no distress. Oxygen saturations 100% RR 16. Patient currently on home Bipap with 2L bled in.

## 2019-11-01 NOTE — ED Notes (Signed)
Patient brought in by father. Patient has MD assisted by wheelchair. Patient being assisted by home Bipap with current settings of : TV 477/ PIP 15/ RR 17/ with bled in 2L 100% oxygen with patient oxygen saturations of 100%. Patient in no visible distress. Patient resting comfortably.

## 2019-11-01 NOTE — ED Notes (Signed)
ED Provider at bedside. 

## 2019-11-01 NOTE — ED Provider Notes (Addendum)
[facility] EMERGENCY DEPARTMENT Provider Note   CSN: [id number] Arrival date & time: 11/01/19  0104     History No chief complaint on file.   Peter Hayes is a 24 y.o. male.  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  He presents today for evaluation of shortness of breath.  According to his father, he began coughing up phlegm this evening, then told him he was having difficulty breathing.  There have been no fevers or chills.  Patient denies he is having any discomfort.  Dad denies any contacts with known Covid individuals.  Patient on home oxygen at 1 L during the day, then uses BiPAP primarily at night.  He arrives here on BiPAP saturating 100% and in no obvious distress.   The history is provided by the patient.       Past Medical History:  Diagnosis Date  . MD (muscular dystrophy) ([place])   . Refusal of blood product    patient is Peter Hayes witness    Patient Active Problem List   Diagnosis Date Noted  . Pressure injury of skin 09/17/2019  . Severe sepsis ([place]) 09/16/2019  . Acute respiratory failure with hypoxia and hypercapnia ([place]) 05/16/2018  . 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 Hypotension   . 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 HCAP (healthcare-associated pneumonia) 12/01/2017  . Hypoglycemia without diagnosis of diabetes mellitus 09/24/2017  . Ileus ([place]) 09/23/2017  . Sepsis ([place]) 05/05/2016  . Muscular dystrophy ([place]) 05/05/2016  . Moderate protein-calorie malnutrition ([place]) 05/05/2016  . Sinus tachycardia 05/05/2016  . Leukocytosis 05/05/2016  . High anion gap metabolic acidosis 06/30/3006  . Respiratory alkalosis 05/05/2016    Past Surgical History:  Procedure Laterality Date  . EYE SURGERY    . FLEXIBLE SIGMOIDOSCOPY N/A 01/10/2018   Procedure: FLEXIBLE SIGMOIDOSCOPY;  Surgeon: Laurence Spates, MD;  Location: [facility] ENDOSCOPY;  Service: Endoscopy;  Laterality: N/A;       No family  history on file.  Social History   Tobacco Use  . Smoking status: Never Smoker  . Smokeless tobacco: Never Used  Substance Use Topics  . Alcohol use: No  . Drug use: No    Home Medications Prior to Admission medications   Medication Sig Start Date End Date Taking? Authorizing Provider  acetaminophen (TYLENOL) 325 MG tablet Take 2 tablets (650 mg total) by mouth every 6 (six) hours as needed for mild pain, moderate pain, fever or headache (or temp > 100). 01/15/18   Earnstine Regal, PA-C  feeding supplement, ENSURE ENLIVE, (ENSURE ENLIVE) LIQD You can use whatever supplement he likes.  He needs about 1300 calories per day. 1.5 liters of fluid and 50 grams of protein per day. You can buy this at the grocery or drug store.  You do not need a prescription. Patient taking differently: Take 237 mLs by mouth 2 (two) times daily with a meal. You can use whatever supplement he likes.  He needs about 1300 calories per day. 1.5 liters of fluid and 50 grams of protein per day. You can buy this at the grocery or drug store.  You do not need a prescription. 01/15/18   Earnstine Regal, PA-C  ferrous sulfate 220 (44 Fe) MG/5ML solution Take 220 mg by mouth daily.    [provider]  ipratropium-albuterol (DUONEB) 0.5-2.5 (3) MG/3ML SOLN Take 3 mLs by nebulization every 6 (six) hours as needed. 09/21/19   Harold Hedge, MD  magnesium oxide (MAG-OX)  400 MG tablet Take 400 mg by mouth 3 (three) times daily.    [provider]  metoprolol tartrate (LOPRESSOR) 25 MG tablet Take 0.5 tablets (12.5 mg total) by mouth 3 (three) times daily. 09/21/19 10/21/19  Harold Hedge, MD  Multiple Vitamin (MULTIVITAMIN WITH MINERALS) TABS tablet You can get him a general multivitamin for daily use at any drug store Patient taking differently: Take 1 tablet by mouth daily.  01/15/18   Earnstine Regal, PA-C  polyethylene glycol [facility] / Floria Raveling) packet Follow package instruction for daily use.  You need  to have one soft bowel movement per day.  If he is not doing this call your primary care doctor.  You also need to be sure he takes in 1.5 liters of fluid per day.   He needs protein supplement daily, and needs to take in about 1300 calories per day. Patient taking differently: Take 17 g by mouth daily. Follow package instruction for daily use.  You need to have one soft bowel movement per day.  If he is not doing this call your primary care doctor.  You also need to be sure he takes in 1.5 liters of fluid per day.   He needs protein supplement daily, and needs to take in about 1300 calories per day. 01/15/18   Earnstine Regal, PA-C  Sennosides (PRUNE SENNA CONCENTRATE PO) Take 8 oz by mouth daily as needed (constipation).    [provider]  simethicone (GAS-X) 80 MG chewable tablet Chew 1 tablet (80 mg total) by mouth every 6 (six) hours as needed for flatulence (distention). 06/03/18   Julianne Rice, MD    Allergies    Fentanyl  Review of Systems   Review of Systems  All other systems reviewed and are negative.   Physical Exam Updated Vital Signs Wt 37.1 kg   BMI 14.96 kg/m   Physical Exam Vitals and nursing note reviewed.  Constitutional:      General: He is not in acute distress.    Appearance: He is well-developed.     Comments: Patient is a chronically ill-appearing 24 year old male.  He is wearing BiPAP, but in no obvious distress.  HENT:     Head: Normocephalic and atraumatic.  Cardiovascular:     Rate and Rhythm: Normal rate and regular rhythm.     Heart sounds: No murmur. No friction rub.  Pulmonary:     Comments: Breath sounds are coarse bilaterally while on BiPAP.  There are no definitive rales or wheezes.  He appears comfortable and in no significant respiratory distress. Abdominal:     General: Bowel sounds are normal. There is no distension.     Palpations: Abdomen is soft.     Tenderness: There is no abdominal tenderness.  Musculoskeletal:      Cervical back: Normal range of motion and neck supple.     Comments: Extremities with muscle wasting.  Skin:    General: Skin is warm and dry.  Neurological:     Mental Status: He is alert and oriented to person, place, and time.     ED Results / Procedures / Treatments   Labs (all labs ordered are listed, but only abnormal results are displayed) Labs Reviewed  SARS CORONAVIRUS 2 BY RT PCR [facility] ORDER, [place] LAB)  BASIC METABOLIC PANEL  CBC WITH DIFFERENTIAL/PLATELET    EKG EKG Interpretation  Date/Time:  Sunday November 01 2019 01:51:12 EST Ventricular Rate:  71 PR Interval:    QRS  Duration: 90 QT Interval:  374 QTC Calculation: 407 R Axis:   87 Text Interpretation: Sinus rhythm Short PR interval ST elev, probable normal early repol pattern Confirmed by Veryl Speak [phone]) on 11/01/2019 3:10:51 AM   Radiology No results found.  Procedures Procedures (including critical care time)  Medications Ordered in ED Medications - No data to display  ED Course  I have reviewed the triage vital signs and the nursing notes.  Pertinent labs & imaging results that were available during my care of the patient were reviewed by me and considered in my medical decision making (see chart for details).    MDM Rules/Calculators/A&P  Patient is a 24 year old male with history of advanced muscular dystrophy.  He is bed and wheelchair ridden.  He has brought by his dad for evaluation of difficulty breathing.  This evening at approximately 10 PM he began to feel as though he had mucus in his throat which he was having difficulty clearing.  He finally was able to cough up some phlegm and now feels better.  Patient's work-up is basically unremarkable.  He has no fever, no white count, and no evidence for pneumonia on his chest x-ray.  His Covid test is negative.  He has been observed for 2 hours and appears comfortable on his BiPAP machine which appears to be  functioning without any complication.  His lungs sound essentially clear and he appears very comfortable.  At this point, disposition was discussed with the patient and father.  I see no indication for admission at this time, and due to the global pandemic, I feel as though this patient would be best suited returning home.  The father is in agreement with this.  The father inquired about the possibility of having a suction canister at home.  I explained to him that this was not something I could do given the time of day and that this should be initiated by his primary doctor.  I will also advise Mucinex as a decongestant.  I have reviewed the patient's record from his prior hospitalization.  During that admission, he was febrile, tachycardic, hypotensive and acutely ill.  He is showing none of these signs this evening.  Final Clinical Impression(s) / ED Diagnoses Final diagnoses:  None    Rx / DC Orders ED Discharge Orders    None       Veryl Speak, MD 11/01/19 2440    Veryl Speak, MD 11/01/19 1027    Veryl Speak, MD 11/01/19 [phone]

## 2019-11-15 ENCOUNTER — Emergency Department (HOSPITAL_BASED_OUTPATIENT_CLINIC_OR_DEPARTMENT_OTHER)
Admission: EM | Admit: 2019-11-15 | Discharge: 2019-11-15 | Disposition: A | Discharge status: Home / Self Care | Payer: Managed Care, Other (non HMO) | Attending: Emergency Medicine | Admitting: Emergency Medicine

## 2019-11-15 ENCOUNTER — Encounter (HOSPITAL_BASED_OUTPATIENT_CLINIC_OR_DEPARTMENT_OTHER): Payer: Self-pay | Admitting: Emergency Medicine

## 2019-11-15 ENCOUNTER — Emergency Department (HOSPITAL_BASED_OUTPATIENT_CLINIC_OR_DEPARTMENT_OTHER): Payer: Managed Care, Other (non HMO)

## 2019-11-15 ENCOUNTER — Other Ambulatory Visit: Payer: Self-pay

## 2019-11-15 DIAGNOSIS — Z79899 Other long term (current) drug therapy: Secondary | ICD-10-CM | POA: Diagnosis not present

## 2019-11-15 DIAGNOSIS — R1084 Generalized abdominal pain: Secondary | ICD-10-CM | POA: Diagnosis not present

## 2019-11-15 DIAGNOSIS — G7101 Duchenne or Becker muscular dystrophy: Secondary | ICD-10-CM | POA: Insufficient documentation

## 2019-11-15 DIAGNOSIS — Z885 Allergy status to narcotic agent status: Secondary | ICD-10-CM | POA: Diagnosis not present

## 2019-11-15 DIAGNOSIS — R109 Unspecified abdominal pain: Secondary | ICD-10-CM | POA: Diagnosis present

## 2019-11-15 LAB — URINALYSIS, ROUTINE W REFLEX MICROSCOPIC
Glucose, UA: NEGATIVE mg/dL
Hgb urine dipstick: NEGATIVE
Ketones, ur: >80 mg/dL — AB
Leukocytes,Ua: NEGATIVE
Nitrite: NEGATIVE
Protein, ur: NEGATIVE mg/dL
Specific Gravity, Urine: 1.015 (ref 1.005–1.030)
pH: 6.5 (ref 5.0–8.0)

## 2019-11-15 LAB — COMPREHENSIVE METABOLIC PANEL
ALT: 17 U/L (ref 0–44)
AST: 26 U/L (ref 15–41)
Albumin: 4.4 g/dL (ref 3.5–5.0)
Alkaline Phosphatase: 72 U/L (ref 38–126)
Anion gap: 14 (ref 5–15)
BUN: 10 mg/dL (ref 6–20)
CO2: 24 mmol/L (ref 22–32)
Calcium: 9.2 mg/dL (ref 8.9–10.3)
Chloride: 98 mmol/L (ref 98–111)
Creatinine, Ser: <0.3 mg/dL — ABNORMAL LOW (ref 0.61–1.24)
Glucose, Bld: 71 mg/dL (ref 70–99)
Potassium: 3.7 mmol/L (ref 3.5–5.1)
Sodium: 136 mmol/L (ref 135–145)
Total Bilirubin: 1.2 mg/dL (ref 0.3–1.2)
Total Protein: 7.7 g/dL (ref 6.5–8.1)

## 2019-11-15 LAB — LIPASE, BLOOD: Lipase: 19 U/L (ref 11–51)

## 2019-11-15 LAB — CBC WITH DIFFERENTIAL/PLATELET
Abs Immature Granulocytes: 0.01 10*3/uL (ref 0.00–0.07)
Basophils Absolute: 0 10*3/uL (ref 0.0–0.1)
Basophils Relative: 0 %
Eosinophils Absolute: 0 10*3/uL (ref 0.0–0.5)
Eosinophils Relative: 1 %
HCT: 43.4 % (ref 39.0–52.0)
Hemoglobin: 14.1 g/dL (ref 13.0–17.0)
Immature Granulocytes: 0 %
Lymphocytes Relative: 19 %
Lymphs Abs: 1.1 10*3/uL (ref 0.7–4.0)
MCH: 28.8 pg (ref 26.0–34.0)
MCHC: 32.5 g/dL (ref 30.0–36.0)
MCV: 88.8 fL (ref 80.0–100.0)
Monocytes Absolute: 0.7 10*3/uL (ref 0.1–1.0)
Monocytes Relative: 12 %
Neutro Abs: 3.9 10*3/uL (ref 1.7–7.7)
Neutrophils Relative %: 68 %
Platelets: 158 10*3/uL (ref 150–400)
RBC: 4.89 MIL/uL (ref 4.22–5.81)
RDW: 13.8 % (ref 11.5–15.5)
WBC: 5.7 10*3/uL (ref 4.0–10.5)
nRBC: 0 % (ref 0.0–0.2)

## 2019-11-15 MED ORDER — METOPROLOL TARTRATE 50 MG PO TABS
25.0000 mg | ORAL_TABLET | Freq: Once | ORAL | Status: AC
  Administered 2019-11-15: 25 mg via ORAL
  Filled 2019-11-15: qty 1

## 2019-11-15 MED ORDER — SODIUM CHLORIDE 0.9 % IV BOLUS
500.0000 mL | Freq: Once | INTRAVENOUS | Status: AC
  Administered 2019-11-15: 500 mL via INTRAVENOUS

## 2019-11-15 MED ORDER — IOHEXOL 300 MG/ML  SOLN
100.0000 mL | Freq: Once | INTRAMUSCULAR | Status: AC | PRN
  Administered 2019-11-15: 75 mL via INTRAVENOUS

## 2019-11-15 NOTE — ED Triage Notes (Signed)
LLQ pain that started during the night. Denies V/D.

## 2019-11-15 NOTE — ED Provider Notes (Signed)
[facility] EMERGENCY DEPARTMENT Provider Note   CSN: [id number] Arrival date & time: 11/15/19  1158     History Chief Complaint  Patient presents with   Abdominal Pain    Peter Hayes is a 24 y.o. male.  Pt presents to the ED today with abdominal pain.  Pt has an unfortunate hx of severe Duchenne's MD.  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  This was complicated by an anastomotic leak requiring colostomy which was followed by a SBO.  Pt has had a recent admission for resp failure, but Covid was negative.  Pt wears bipap at night and when he is moving around.  No sob now.  Pt's father said he woke up this morning with the pain and did not have much output from his colostomy.  After he had more output, his pain lessened.  Pt said his pain is not too bad right now.  No n/v.  No f/c.  Pt is wheelchair bound.          Past Medical History:  Diagnosis Date   MD (muscular dystrophy) ([place])    Refusal of blood product    patient is Peter Hayes witness    Patient Active Problem List   Diagnosis Date Noted   Pressure injury of skin 09/17/2019   Severe sepsis ([place]) 09/16/2019   Acute respiratory failure with hypoxia and hypercapnia ([place]) 05/16/2018   Intestinal volvulus ([facility])    Hypotension    Sigmoid volvulus ([place]) 01/10/2018   HCAP (healthcare-associated pneumonia) 12/01/2017   Hypoglycemia without diagnosis of diabetes mellitus 09/24/2017   Ileus ([place]) 09/23/2017   Sepsis ([place]) 05/05/2016   Muscular dystrophy ([place]) 05/05/2016   Moderate protein-calorie malnutrition (HCC) 05/05/2016   Sinus tachycardia 05/05/2016   Leukocytosis 05/05/2016   High anion gap metabolic acidosis 69/62/9528   Respiratory alkalosis 05/05/2016    Past Surgical History:  Procedure Laterality Date   COLOSTOMY     EYE SURGERY     FLEXIBLE SIGMOIDOSCOPY N/A 01/10/2018   Procedure: FLEXIBLE SIGMOIDOSCOPY;  Surgeon: Laurence Spates, MD;   Location: [facility];  Service: Endoscopy;  Laterality: N/A;   SMALL INTESTINE SURGERY         No family history on file.  Social History   Tobacco Use   Smoking status: Never Smoker   Smokeless tobacco: Never Used  Substance Use Topics   Alcohol use: No   Drug use: No    Home Medications Prior to Admission medications   Medication Sig Start Date End Date Taking? Authorizing Provider  acetaminophen (TYLENOL) 325 MG tablet Take 2 tablets (650 mg total) by mouth every 6 (six) hours as needed for mild pain, moderate pain, fever or headache (or temp > 100). 01/15/18   Earnstine Regal, PA-C  feeding supplement, ENSURE ENLIVE, (ENSURE ENLIVE) LIQD You can use whatever supplement he likes.  He needs about 1300 calories per day. 1.5 liters of fluid and 50 grams of protein per day. You can buy this at the grocery or drug store.  You do not need a prescription. Patient taking differently: Take 237 mLs by mouth 2 (two) times daily with a meal. You can use whatever supplement he likes.  He needs about 1300 calories per day. 1.5 liters of fluid and 50 grams of protein per day. You can buy this at the grocery or drug store.  You do not need a prescription. 01/15/18   Earnstine Regal, PA-C  ferrous sulfate 220 (44 Fe) MG/5ML  solution Take 220 mg by mouth daily.    [provider]  ipratropium-albuterol (DUONEB) 0.5-2.5 (3) MG/3ML SOLN Take 3 mLs by nebulization every 6 (six) hours as needed. 09/21/19   Harold Hedge, MD  magnesium oxide (MAG-OX) 400 MG tablet Take 400 mg by mouth 3 (three) times daily.    [provider]  metoprolol tartrate (LOPRESSOR) 25 MG tablet Take 0.5 tablets (12.5 mg total) by mouth 3 (three) times daily. 09/21/19 10/21/19  Harold Hedge, MD  Multiple Vitamin (MULTIVITAMIN WITH MINERALS) TABS tablet You can get him a general multivitamin for daily use at any drug store Patient taking differently: Take 1 tablet by mouth daily.  01/15/18   Earnstine Regal, PA-C  polyethylene glycol [facility] / Floria Raveling) packet Follow package instruction for daily use.  You need to have one soft bowel movement per day.  If he is not doing this call your primary care doctor.  You also need to be sure he takes in 1.5 liters of fluid per day.   He needs protein supplement daily, and needs to take in about 1300 calories per day. Patient taking differently: Take 17 g by mouth daily. Follow package instruction for daily use.  You need to have one soft bowel movement per day.  If he is not doing this call your primary care doctor.  You also need to be sure he takes in 1.5 liters of fluid per day.   He needs protein supplement daily, and needs to take in about 1300 calories per day. 01/15/18   Earnstine Regal, PA-C  Sennosides (PRUNE SENNA CONCENTRATE PO) Take 8 oz by mouth daily as needed (constipation).    [provider]  simethicone (GAS-X) 80 MG chewable tablet Chew 1 tablet (80 mg total) by mouth every 6 (six) hours as needed for flatulence (distention). 06/03/18   Julianne Rice, MD    Allergies    Fentanyl  Review of Systems   Review of Systems  Gastrointestinal: Positive for abdominal pain.  All other systems reviewed and are negative.   Physical Exam Updated Vital Signs BP 122/71 (BP Location: Right Arm)    Pulse 92    Temp 98.9 F (37.2 C) (Oral)    Resp 18    SpO2 100% Comment: AVAP  Physical Exam Vitals reviewed.  Constitutional:      Appearance: He is underweight.  HENT:     Mouth/Throat:     Mouth: Mucous membranes are moist.  Eyes:     Extraocular Movements: Extraocular movements intact.  Cardiovascular:     Rate and Rhythm: Normal rate and regular rhythm.  Pulmonary:     Effort: Pulmonary effort is normal.     Breath sounds: Normal breath sounds.     Comments: Pt on his home bipap and saturating 100% Abdominal:     General: Abdomen is flat. Bowel sounds are normal.     Palpations: Abdomen is soft.     Comments:  Colostomy noted  Musculoskeletal:     Comments: Severe muscle wasting to extremities  Skin:    General: Skin is warm.     Capillary Refill: Capillary refill takes less than 2 seconds.  Neurological:     Mental Status: He is alert.  Psychiatric:        Mood and Affect: Mood normal.     ED Results / Procedures / Treatments   Labs (all labs ordered are listed, but only abnormal results are displayed) Labs Reviewed  COMPREHENSIVE METABOLIC PANEL -  Abnormal; Notable for the following components:      Result Value   Creatinine, Ser <0.30 (*)    All other components within normal limits  URINALYSIS, ROUTINE W REFLEX MICROSCOPIC - Abnormal; Notable for the following components:   Bilirubin Urine SMALL (*)    Ketones, ur >80 (*)    All other components within normal limits  CBC WITH DIFFERENTIAL/PLATELET  LIPASE, BLOOD    EKG None  Radiology CT Abdomen Pelvis W Contrast  Result Date: 11/15/2019 CLINICAL DATA:  Diffuse abdominal pain, left lower quadrant pain EXAM: CT ABDOMEN AND PELVIS WITH CONTRAST TECHNIQUE: Multidetector CT imaging of the abdomen and pelvis was performed using the standard protocol following bolus administration of intravenous contrast. CONTRAST:  2mL OMNIPAQUE IOHEXOL 300 MG/ML  SOLN COMPARISON:  None. FINDINGS: Lower chest: No acute abnormality. Hepatobiliary: No focal hepatic abnormality. Gallbladder unremarkable. Pancreas: No focal abnormality or ductal dilatation. Spleen: No focal abnormality.  Normal size. Adrenals/Urinary Tract: No adrenal abnormality. No focal renal abnormality. No stones or hydronephrosis. Urinary bladder wall is mildly thickened. Stomach/Bowel: There is gaseous distention of stomach, large and small bowel. 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 There is a swirling appearance of the mesenteric vessels raising possibility of a component of volvulus. Vascular/Lymphatic: No evidence of aneurysm or adenopathy.  Reproductive: No visible focal abnormality. Other: No free fluid or free air. Musculoskeletal: Leftward scoliosis.  No acute bony abnormality. IMPRESSION: Swallowed appearance of the central mesenteric vessels with mild gaseous distention of stomach, large and small bowel. While this could reflect ileus, giving the swirled appearance of the vessels, cannot exclude volvulus. Left lower quadrant ostomy is decompressed. Mild diffuse bladder wall thickening could reflect cystitis. Recommend clinical correlation. Electronically Signed   By: Rolm Baptise M.D.   On: 11/15/2019 15:06    Procedures Procedures (including critical care time)  Medications Ordered in ED Medications  metoprolol tartrate (LOPRESSOR) tablet 25 mg (has no administration in time range)  iohexol (OMNIPAQUE) 300 MG/ML solution 100 mL (75 mLs Intravenous Contrast Given 11/15/19 1433)  sodium chloride 0.9 % bolus 500 mL (500 mLs Intravenous New Bag/Given 11/15/19 1539)    ED Course  I have reviewed the triage vital signs and the nursing notes.  Pertinent labs & imaging results that were available during my care of the patient were reviewed by me and considered in my medical decision making (see chart for details).    MDM Rules/Calculators/A&P                     Pt feels better.  The CT shows ileus vs volvulus.  The pt is having good output from the colostomy site and his pain is better. Pt is taken off bipap to try a po challenge. Pt tolerating po fluids. Pt's UA shows some ketones, so he's given some IVFs.  Pt would rather go home and come back if he worsens.    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

## 2019-11-20 ENCOUNTER — Other Ambulatory Visit: Payer: Self-pay

## 2019-11-20 ENCOUNTER — Encounter (HOSPITAL_BASED_OUTPATIENT_CLINIC_OR_DEPARTMENT_OTHER): Payer: Self-pay | Admitting: *Deleted

## 2019-11-20 ENCOUNTER — Emergency Department (HOSPITAL_BASED_OUTPATIENT_CLINIC_OR_DEPARTMENT_OTHER): Payer: Managed Care, Other (non HMO)

## 2019-11-20 ENCOUNTER — Emergency Department (HOSPITAL_BASED_OUTPATIENT_CLINIC_OR_DEPARTMENT_OTHER)
Admission: EM | Admit: 2019-11-20 | Discharge: 2019-11-20 | Disposition: A | Discharge status: Home / Self Care | Payer: Managed Care, Other (non HMO) | Attending: Emergency Medicine | Admitting: Emergency Medicine

## 2019-11-20 DIAGNOSIS — Z885 Allergy status to narcotic agent status: Secondary | ICD-10-CM | POA: Insufficient documentation

## 2019-11-20 DIAGNOSIS — Z79899 Other long term (current) drug therapy: Secondary | ICD-10-CM | POA: Insufficient documentation

## 2019-11-20 DIAGNOSIS — R0981 Nasal congestion: Secondary | ICD-10-CM | POA: Diagnosis present

## 2019-11-20 DIAGNOSIS — K117 Disturbances of salivary secretion: Secondary | ICD-10-CM | POA: Diagnosis not present

## 2019-11-20 DIAGNOSIS — R6889 Other general symptoms and signs: Secondary | ICD-10-CM

## 2019-11-20 NOTE — Discharge Instructions (Signed)
Suction at home as needed.  Follow-up with your pulmonologist.

## 2019-11-20 NOTE — ED Triage Notes (Signed)
Home health nurse states , she just needs and order for suction machine and neb machine, no return call from [facility]. fAX ORDER TO [facility]

## 2019-11-20 NOTE — ED Provider Notes (Signed)
[facility] EMERGENCY DEPARTMENT Provider Note   CSN: [id number] Arrival date & time: 11/20/19  1756     History Chief Complaint  Patient presents with  . Nasal Congestion    Peter Hayes is a 24 y.o. male.  24 year old male with history of muscular dystrophy presents with complaint of excess mucus production and congestion.  Patient was seen in the emergency room for same earlier this week, required suctioning and followed up with his pulmonologist.  Patient was given albuterol solution however no nebulizer, family has been in touch with the doctor's office throughout the week however has not been able to get the orders they need for home health to continue to suction patient and provide neb treatments.  Father is at bedside, states similar to previous presentation, mucus is clear, patient is afebrile and otherwise at baseline.  No new complaints or concerns today.        Past Medical History:  Diagnosis Date  . MD (muscular dystrophy) ([place])   . Refusal of blood product    patient is Peter Hayes witness    Patient Active Problem List   Diagnosis Date Noted  . Pressure injury of skin 09/17/2019  . Severe sepsis ([place]) 09/16/2019  . Acute respiratory failure with hypoxia and hypercapnia ([place]) 05/16/2018  . Intestinal volvulus ([place])   . Hypotension   . Sigmoid volvulus ([place]) 01/10/2018  . HCAP (healthcare-associated pneumonia) 12/01/2017  . Hypoglycemia without diagnosis of diabetes mellitus 09/24/2017  . Ileus ([place]) 09/23/2017  . Sepsis ([place]) 05/05/2016  . Muscular dystrophy ([place]) 05/05/2016  . Moderate protein-calorie malnutrition ([place]) 05/05/2016  . Sinus tachycardia 05/05/2016  . Leukocytosis 05/05/2016  . High anion gap metabolic acidosis 35/32/9924  . Respiratory alkalosis 05/05/2016    Past Surgical History:  Procedure Laterality Date  . COLOSTOMY    . EYE SURGERY    . FLEXIBLE SIGMOIDOSCOPY N/A 01/10/2018   Procedure: FLEXIBLE SIGMOIDOSCOPY;  Surgeon:  Laurence Spates, MD;  Location: [facility];  Service: Endoscopy;  Laterality: N/A;  . SMALL INTESTINE SURGERY         No family history on file.  Social History   Tobacco Use  . Smoking status: Never Smoker  . Smokeless tobacco: Never Used  Substance Use Topics  . Alcohol use: No  . Drug use: No    Home Medications Prior to Admission medications   Medication Sig Start Date End Date Taking? Authorizing Provider  acetaminophen (TYLENOL) 325 MG tablet Take 2 tablets (650 mg total) by mouth every 6 (six) hours as needed for mild pain, moderate pain, fever or headache (or temp > 100). 01/15/18   Earnstine Regal, PA-C  feeding supplement, ENSURE ENLIVE, (ENSURE ENLIVE) LIQD You can use whatever supplement he likes.  He needs about 1300 calories per day. 1.5 liters of fluid and 50 grams of protein per day. You can buy this at the grocery or drug store.  You do not need a prescription. Patient taking differently: Take 237 mLs by mouth 2 (two) times daily with a meal. You can use whatever supplement he likes.  He needs about 1300 calories per day. 1.5 liters of fluid and 50 grams of protein per day. You can buy this at the grocery or drug store.  You do not need a prescription. 01/15/18   Earnstine Regal, PA-C  ferrous sulfate 220 (44 Fe) MG/5ML solution Take 220 mg by mouth daily.    [provider]  ipratropium-albuterol (DUONEB) 0.5-2.5 (3) MG/3ML SOLN Take 3 mLs by  nebulization every 6 (six) hours as needed. 09/21/19   Harold Hedge, MD  magnesium oxide (MAG-OX) 400 MG tablet Take 400 mg by mouth 3 (three) times daily.    [provider]  metoprolol tartrate (LOPRESSOR) 25 MG tablet Take 0.5 tablets (12.5 mg total) by mouth 3 (three) times daily. 09/21/19 10/21/19  Harold Hedge, MD  Multiple Vitamin (MULTIVITAMIN WITH MINERALS) TABS tablet You can get him a general multivitamin for daily use at any drug store Patient taking differently: Take 1 tablet by mouth daily.   01/15/18   Earnstine Regal, PA-C  polyethylene glycol [facility] / [facility] Raveling) packet Follow package instruction for daily use.  You need to have one soft bowel movement per day.  If he is not doing this call your primary care doctor.  You also need to be sure he takes in 1.5 liters of fluid per day.   He needs protein supplement daily, and needs to take in about 1300 calories per day. Patient taking differently: Take 17 g by mouth daily. Follow package instruction for daily use.  You need to have one soft bowel movement per day.  If he is not doing this call your primary care doctor.  You also need to be sure he takes in 1.5 liters of fluid per day.   He needs protein supplement daily, and needs to take in about 1300 calories per day. 01/15/18   Earnstine Regal, PA-C  Sennosides (PRUNE SENNA CONCENTRATE PO) Take 8 oz by mouth daily as needed (constipation).    [provider]  simethicone (GAS-X) 80 MG chewable tablet Chew 1 tablet (80 mg total) by mouth every 6 (six) hours as needed for flatulence (distention). 06/03/18   Julianne Rice, MD    Allergies    Fentanyl  Review of Systems   Review of Systems  Constitutional: Negative for fever.  HENT: Positive for congestion.   Respiratory: Positive for cough. Negative for shortness of breath.   Cardiovascular: Negative for chest pain.  Gastrointestinal: Negative for abdominal pain and vomiting.  Skin: Negative for rash and wound.  All other systems reviewed and are negative.   Physical Exam Updated Vital Signs BP 116/69   Pulse 95   Wt 35.8 kg   SpO2 97%   BMI 14.45 kg/m   Physical Exam Vitals and nursing note reviewed.  Constitutional:      General: He is not in acute distress.    Appearance: He is well-developed. He is not diaphoretic.  HENT:     Head: Normocephalic and atraumatic.  Cardiovascular:     Rate and Rhythm: Normal rate and regular rhythm.     Heart sounds: Normal heart sounds.  Pulmonary:     Effort:  Pulmonary effort is normal.     Breath sounds: Examination of the right-upper field reveals rhonchi. Rhonchi present.  Chest:     Chest wall: No tenderness.  Abdominal:     Palpations: Abdomen is soft.  Skin:    General: Skin is warm and dry.     Findings: No erythema or rash.  Neurological:     Mental Status: He is alert and oriented to person, place, and time.  Psychiatric:        Behavior: Behavior normal.     ED Results / Procedures / Treatments   Labs (all labs ordered are listed, but only abnormal results are displayed) Labs Reviewed - No data to display  EKG None  Radiology DG Chest [facility] 1 View  Result  Date: 11/20/2019 CLINICAL DATA:  Congestion and cough EXAM: PORTABLE CHEST 1 VIEW COMPARISON:  November 01, 2019 FINDINGS: The heart size and mediastinal contours are within normal limits. Both lungs are clear. The visualized skeletal structures are unremarkable. IMPRESSION: No active disease. Electronically Signed   By: Abelardo Diesel M.D.   On: 11/20/2019 19:15    Procedures Procedures (including critical care time)  Medications Ordered in ED Medications - No data to display  ED Course  I have reviewed the triage vital signs and the nursing notes.  Pertinent labs & imaging results that were available during my care of the patient were reviewed by me and considered in my medical decision making (see chart for details).  Clinical Course as of Nov 19 1937  Fri Nov 20, 2019  1851 Prescriptions given for nebulizer as well as portable NTS suction machine, suction tubing, suction catheter tray with chimney valve.   [LM]  3251 24 year old male with history of muscular dystrophy presents with excess mucus production requiring suction.  Similar presentation previously, has requested suction supplies from pulmonology however has not heard back from the office.  On arrival, patient was greeted by respiratory therapy who suctioned patient with resolution of discomfort.  On exam  patient had coarse lung sounds in the right upper lobe, this was followed with a chest x-ray which was unremarkable.  Family is comfortable with plan for discharge home, prescription note above sent to DME provider for home care nurse to pick up today to continue care.   [LM]    Clinical Course User Index [LM] Roque Lias   MDM Rules/Calculators/A&P                      Final Clinical Impression(s) / ED Diagnoses Final diagnoses:  Excessive oral secretions    Rx / DC Orders ED Discharge Orders         Ordered    For home use only DME Nebulizer machine     11/20/19 1814           Roque Lias 11/20/19 Doretha Sou, MD 11/20/19 2314

## 2019-11-20 NOTE — ED Notes (Signed)
Waiting for call back from [facility] for equipment

## 2019-11-20 NOTE — ED Notes (Signed)
Order for supplies faxed to [facility] per Home health nurse

## 2019-11-20 NOTE — ED Notes (Signed)
Pt presented to the ED with cough unable to bring up sputum with patients BIPAP machine on and father at bedside. NTS suction patient with small amount of thin white secretions. No distress noted. 10 F catheter used. Family wanting order for suction machine at home.

## 2020-02-06 ENCOUNTER — Encounter (HOSPITAL_BASED_OUTPATIENT_CLINIC_OR_DEPARTMENT_OTHER): Payer: Self-pay | Admitting: *Deleted

## 2020-02-06 ENCOUNTER — Emergency Department (HOSPITAL_BASED_OUTPATIENT_CLINIC_OR_DEPARTMENT_OTHER): Payer: Managed Care, Other (non HMO)

## 2020-02-06 ENCOUNTER — Inpatient Hospital Stay (HOSPITAL_BASED_OUTPATIENT_CLINIC_OR_DEPARTMENT_OTHER)
Admission: EM | Admit: 2020-02-06 | Discharge: 2020-02-08 | DRG: 199 | Disposition: A | Discharge status: Home / Self Care | Payer: Managed Care, Other (non HMO) | Attending: Internal Medicine | Admitting: Internal Medicine

## 2020-02-06 ENCOUNTER — Other Ambulatory Visit: Payer: Self-pay

## 2020-02-06 DIAGNOSIS — G7109 Other specified muscular dystrophies: Secondary | ICD-10-CM | POA: Diagnosis present

## 2020-02-06 DIAGNOSIS — E43 Unspecified severe protein-calorie malnutrition: Secondary | ICD-10-CM | POA: Diagnosis present

## 2020-02-06 DIAGNOSIS — Z681 Body mass index (BMI) 19 or less, adult: Secondary | ICD-10-CM

## 2020-02-06 DIAGNOSIS — E440 Moderate protein-calorie malnutrition: Secondary | ICD-10-CM | POA: Diagnosis present

## 2020-02-06 DIAGNOSIS — Z9911 Dependence on respirator [ventilator] status: Secondary | ICD-10-CM

## 2020-02-06 DIAGNOSIS — R1312 Dysphagia, oropharyngeal phase: Secondary | ICD-10-CM | POA: Diagnosis present

## 2020-02-06 DIAGNOSIS — J40 Bronchitis, not specified as acute or chronic: Secondary | ICD-10-CM | POA: Diagnosis present

## 2020-02-06 DIAGNOSIS — Z20822 Contact with and (suspected) exposure to covid-19: Secondary | ICD-10-CM | POA: Diagnosis present

## 2020-02-06 DIAGNOSIS — Z79899 Other long term (current) drug therapy: Secondary | ICD-10-CM

## 2020-02-06 DIAGNOSIS — Z888 Allergy status to other drugs, medicaments and biological substances status: Secondary | ICD-10-CM | POA: Diagnosis not present

## 2020-02-06 DIAGNOSIS — Z8669 Personal history of other diseases of the nervous system and sense organs: Secondary | ICD-10-CM

## 2020-02-06 DIAGNOSIS — G7100 Muscular dystrophy, unspecified: Secondary | ICD-10-CM | POA: Diagnosis present

## 2020-02-06 DIAGNOSIS — Z7401 Bed confinement status: Secondary | ICD-10-CM

## 2020-02-06 DIAGNOSIS — J9811 Atelectasis: Secondary | ICD-10-CM | POA: Diagnosis present

## 2020-02-06 DIAGNOSIS — E162 Hypoglycemia, unspecified: Secondary | ICD-10-CM | POA: Diagnosis present

## 2020-02-06 DIAGNOSIS — Z933 Colostomy status: Secondary | ICD-10-CM

## 2020-02-06 DIAGNOSIS — J982 Interstitial emphysema: Principal | ICD-10-CM | POA: Diagnosis present

## 2020-02-06 HISTORY — DX: Hypotension, unspecified: I95.9

## 2020-02-06 LAB — CBC
HCT: 42.5 % (ref 39.0–52.0)
Hemoglobin: 13.7 g/dL (ref 13.0–17.0)
MCH: 28.4 pg (ref 26.0–34.0)
MCHC: 32.2 g/dL (ref 30.0–36.0)
MCV: 88.2 fL (ref 80.0–100.0)
Platelets: 215 10*3/uL (ref 150–400)
RBC: 4.82 MIL/uL (ref 4.22–5.81)
RDW: 13.5 % (ref 11.5–15.5)
WBC: 9.4 10*3/uL (ref 4.0–10.5)
nRBC: 0 % (ref 0.0–0.2)

## 2020-02-06 LAB — CBC WITH DIFFERENTIAL/PLATELET
Basophils Absolute: 0 10*3/uL (ref 0.0–0.1)
Basophils Relative: 0 %
Eosinophils Absolute: 0.1 10*3/uL (ref 0.0–0.5)
Eosinophils Relative: 1 %
HCT: 42.2 % (ref 39.0–52.0)
Hemoglobin: 14.3 g/dL (ref 13.0–17.0)
Lymphocytes Relative: 15 %
Lymphs Abs: 1 10*3/uL (ref 0.7–4.0)
MCH: 28.8 pg (ref 26.0–34.0)
MCHC: 33.9 g/dL (ref 30.0–36.0)
MCV: 85.1 fL (ref 80.0–100.0)
Monocytes Absolute: 0.7 10*3/uL (ref 0.1–1.0)
Monocytes Relative: 11 %
Neutro Abs: 4.9 10*3/uL (ref 1.7–7.7)
Neutrophils Relative %: 73 %
Platelets: 100 10*3/uL — ABNORMAL LOW (ref 150–400)
RBC: 4.96 MIL/uL (ref 4.22–5.81)
RDW: 13.5 % (ref 11.5–15.5)
WBC: 6.7 10*3/uL (ref 4.0–10.5)
nRBC: 0 % (ref 0.0–0.2)
nRBC: 0 /100 WBC

## 2020-02-06 LAB — RESPIRATORY PANEL BY RT PCR (FLU A&B, COVID)
Influenza A by PCR: NEGATIVE
Influenza B by PCR: NEGATIVE
SARS Coronavirus 2 by RT PCR: NEGATIVE

## 2020-02-06 LAB — BASIC METABOLIC PANEL
Anion gap: 18 — ABNORMAL HIGH (ref 5–15)
BUN: 14 mg/dL (ref 6–20)
CO2: 20 mmol/L — ABNORMAL LOW (ref 22–32)
Calcium: 9.2 mg/dL (ref 8.9–10.3)
Chloride: 99 mmol/L (ref 98–111)
Creatinine, Ser: <0.3 mg/dL — ABNORMAL LOW (ref 0.61–1.24)
Glucose, Bld: 54 mg/dL — ABNORMAL LOW (ref 70–99)
Potassium: 4 mmol/L (ref 3.5–5.1)
Sodium: 137 mmol/L (ref 135–145)

## 2020-02-06 LAB — CREATININE, SERUM: Creatinine, Ser: <0.3 mg/dL — ABNORMAL LOW (ref 0.61–1.24)

## 2020-02-06 LAB — CBG MONITORING, ED: Glucose-Capillary: 69 mg/dL — ABNORMAL LOW (ref 70–99)

## 2020-02-06 MED ORDER — HEPARIN SODIUM (PORCINE) 5000 UNIT/ML IJ SOLN
5000.0000 [IU] | Freq: Three times a day (TID) | INTRAMUSCULAR | Status: DC
  Administered 2020-02-07 – 2020-02-08 (×4): 5000 [IU] via SUBCUTANEOUS
  Filled 2020-02-06 (×5): qty 1

## 2020-02-06 MED ORDER — SODIUM CHLORIDE 0.9 % IV SOLN
500.0000 mg | INTRAVENOUS | Status: DC
  Administered 2020-02-06: 500 mg via INTRAVENOUS
  Filled 2020-02-06: qty 500

## 2020-02-06 MED ORDER — THIAMINE HCL 100 MG/ML IJ SOLN
Freq: Once | INTRAVENOUS | Status: DC
  Filled 2020-02-06: qty 1000

## 2020-02-06 MED ORDER — GLUCOSE 40 % PO GEL
ORAL | Status: AC
  Filled 2020-02-06: qty 1

## 2020-02-06 MED ORDER — SODIUM CHLORIDE 0.9 % IV SOLN: 1.0000 g | Freq: Once | INTRAVENOUS | Status: DC

## 2020-02-06 MED ORDER — SODIUM CHLORIDE 0.9 % IV SOLN
INTRAVENOUS | Status: DC | PRN
  Administered 2020-02-06: 500 mL via INTRAVENOUS

## 2020-02-06 MED ORDER — DEXTROSE 50 % IV SOLN
25.0000 mL | Freq: Once | INTRAVENOUS | Status: AC
  Administered 2020-02-06: 25 mL via INTRAVENOUS
  Filled 2020-02-06: qty 50

## 2020-02-06 MED ORDER — SODIUM CHLORIDE 0.9 % IV SOLN
2.0000 g | INTRAVENOUS | Status: DC
  Administered 2020-02-06: 2 g via INTRAVENOUS
  Filled 2020-02-06: qty 20

## 2020-02-06 NOTE — Progress Notes (Signed)
Patient arrived to room on his CPAP. He has his own trilogy and special mask that he wears. I transitioned his O2 over ours, and plugged his machine into a red outlet. Seems to be doing well on it, so I do not see a need to change to my machine. RT will continue to monitor.

## 2020-02-06 NOTE — ED Notes (Signed)
Pt given ice chips with approval from Dr. Gloriann Loan

## 2020-02-06 NOTE — ED Provider Notes (Signed)
[facility] EMERGENCY DEPARTMENT Provider Note   CSN: [id number] Arrival date & time: 02/06/20  1635     History Chief Complaint  Patient presents with  . Cough    Peter Hayes is a 24 y.o. male.  Patient is a 24 year old male with past medical history of advanced muscular dystrophy with progressive decline.  He is brought to the ED by his father for evaluation of chest pain and congestion that has worsened over the past few months.  Patient has been seen on other occasions with similar complaints.  He was eventually given a home suction canister.  Dad has also been giving him over-the-counter decongestants with little relief.  He was also given amoxicillin at 1 point, however this has not helped.  Dad denies fevers and denies exposure to known Covid positive individuals.  The history is provided by the patient and a parent.  Cough Cough characteristics:  Productive Severity:  Moderate Onset quality:  Gradual Timing:  Constant Progression:  Worsening Relieved by:  Nothing Worsened by:  Nothing Ineffective treatments:  None tried      Past Medical History:  Diagnosis Date  . MD (muscular dystrophy) ([place])   . Refusal of blood product    [name]    Patient Active Problem List   Diagnosis Date Noted  . Pressure injury of skin 09/17/2019  . Severe sepsis ([place]) 09/16/2019  . Acute respiratory failure with hypoxia and hypercapnia ([place]) 05/16/2018  . 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 Hypotension   . 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 HCAP (healthcare-associated pneumonia) 12/01/2017  . Hypoglycemia without diagnosis of diabetes mellitus 09/24/2017  . Ileus ([place]) 09/23/2017  . Sepsis ([place]) 05/05/2016  . Muscular dystrophy ([place]) 05/05/2016  . Moderate protein-calorie malnutrition ([place]) 05/05/2016  . Sinus tachycardia 05/05/2016  . Leukocytosis 05/05/2016  . High anion gap metabolic acidosis 23/34/3568  . Respiratory alkalosis 05/05/2016    Past  Surgical History:  Procedure Laterality Date  . COLOSTOMY    . EYE SURGERY    . FLEXIBLE SIGMOIDOSCOPY N/A 01/10/2018   Procedure: FLEXIBLE SIGMOIDOSCOPY;  Surgeon: Laurence Spates, MD;  Location: [facility];  Service: Endoscopy;  Laterality: N/A;  . SMALL INTESTINE SURGERY         No family history on file.  Social History   Tobacco Use  . Smoking status: Never Smoker  . Smokeless tobacco: Never Used  Substance Use Topics  . Alcohol use: No  . Drug use: No    Home Medications Prior to Admission medications   Medication Sig Start Date End Date Taking? Authorizing Provider  acetaminophen (TYLENOL) 325 MG tablet Take 2 tablets (650 mg total) by mouth every 6 (six) hours as needed for mild pain, moderate pain, fever or headache (or temp > 100). 01/15/18   Earnstine Regal, PA-C  feeding supplement, ENSURE ENLIVE, (ENSURE ENLIVE) LIQD You can use whatever supplement he likes.  He needs about 1300 calories per day. 1.5 liters of fluid and 50 grams of protein per day. You can buy this at the grocery or drug store.  You do not need a prescription. Patient taking differently: Take 237 mLs by mouth 2 (two) times daily with a meal. You can use whatever supplement he likes.  He needs about 1300 calories per day. 1.5 liters of fluid and 50 grams of protein per day. You can buy this at the grocery or drug store.  You do not need a prescription. 01/15/18   Earnstine Regal, PA-C  ferrous sulfate 220 (44 Fe) MG/5ML solution Take 220 mg by mouth daily.    [provider]  ipratropium-albuterol (DUONEB) 0.5-2.5 (3) MG/3ML SOLN Take 3 mLs by nebulization every 6 (six) hours as needed. 09/21/19   Harold Hedge, MD  magnesium oxide (MAG-OX) 400 MG tablet Take 400 mg by mouth 3 (three) times daily.    [provider]  metoprolol tartrate (LOPRESSOR) 25 MG tablet Take 0.5 tablets (12.5 mg total) by mouth 3 (three) times daily. 09/21/19 10/21/19  Harold Hedge, MD  Multiple Vitamin  (MULTIVITAMIN WITH MINERALS) TABS tablet You can get him a general multivitamin for daily use at any drug store Patient taking differently: Take 1 tablet by mouth daily.  01/15/18   Earnstine Regal, PA-C  polyethylene glycol [facility] / Floria Raveling) packet Follow package instruction for daily use.  You need to have one soft bowel movement per day.  If he is not doing this call your primary care doctor.  You also need to be sure he takes in 1.5 liters of fluid per day.   He needs protein supplement daily, and needs to take in about 1300 calories per day. Patient taking differently: Take 17 g by mouth daily. Follow package instruction for daily use.  You need to have one soft bowel movement per day.  If he is not doing this call your primary care doctor.  You also need to be sure he takes in 1.5 liters of fluid per day.   He needs protein supplement daily, and needs to take in about 1300 calories per day. 01/15/18   Earnstine Regal, PA-C  Sennosides (PRUNE SENNA CONCENTRATE PO) Take 8 oz by mouth daily as needed (constipation).    [provider]  simethicone (GAS-X) 80 MG chewable tablet Chew 1 tablet (80 mg total) by mouth every 6 (six) hours as needed for flatulence (distention). 06/03/18   Julianne Rice, MD    Allergies    Fentanyl  Review of Systems   Review of Systems  Respiratory: Positive for cough.   All other systems reviewed and are negative.   Physical Exam Updated Vital Signs BP 101/66   Pulse 83   SpO2 98%   Physical Exam Vitals and nursing note reviewed.  Constitutional:      General: He is not in acute distress.    Comments: Patient is a chronically ill-appearing, emaciated 24 year old male in no acute distress.  He is currently on BiPAP and is resting comfortably.  HENT:     Head: Normocephalic and atraumatic.  Cardiovascular:     Rate and Rhythm: Normal rate and regular rhythm.     Heart sounds: Normal heart sounds. No murmur.  Pulmonary:     Effort:  Pulmonary effort is normal. No respiratory distress.     Breath sounds: Rhonchi present.     Comments: Patient is on his home BiPAP and seems to be tolerating this well.  He is in no respiratory distress and appears comfortable. Skin:    General: Skin is warm and dry.  Neurological:     Mental Status: He is alert.     ED Results / Procedures / Treatments   Labs (all labs ordered are listed, but only abnormal results are displayed) Labs Reviewed - No data to display  EKG EKG Interpretation  Date/Time:  Saturday Feb 06 2020 16:53:02 EDT Ventricular Rate:  79 PR Interval:    QRS Duration: 91 QT Interval:  385 QTC Calculation: 442 R Axis:   80  Text Interpretation: Sinus rhythm Short PR interval Borderline low voltage, extremity leads Baseline wander in lead(s) I II aVR aVL aVF V1 V2 V3 V4 V5 V6 Confirmed by Veryl Speak [id number]) on 02/06/2020 5:03:33 PM   Radiology No results found.  Procedures Procedures (including critical care time)  Medications Ordered in ED Medications - No data to display  ED Course  I have reviewed the triage vital signs and the nursing notes.  Pertinent labs & imaging results that were available during my care of the patient were reviewed by me and considered in my medical decision making (see chart for details).    MDM Rules/Calculators/A&P  Patient is a 24 year old male with history of muscular dystrophy.  He is in the advanced stages of this and requires oxygen and intermittent BiPAP at home.  He is brought today by his dad for evaluation of chest discomfort and cough.  Chest x-ray today reveals mediastinal air of uncertain etiology, however felt to be related to barotrauma from cough and BiPAP.  This finding was discussed with Dr. Kipp Brood from cardiothoracic surgery.  He has recommended admission for overnight observation.  Care discussed with the hospitalist who agrees to admit.  Patient will be given IV Rocephin and transferred to [facility].  Final Clinical Impression(s) / ED Diagnoses Final diagnoses:  None    Rx / DC Orders ED Discharge Orders    None       Veryl Speak, MD 02/06/20 2356

## 2020-02-06 NOTE — Progress Notes (Signed)
Patient still resting comfortably on his BiPAP. Vital signs and measurements stable. RT will continue to monitor.

## 2020-02-06 NOTE — ED Triage Notes (Addendum)
Pt's father reports pt with productive cough since end of January. Pt has been seen several times for same. He c/o pain in his chest when he coughs. He is wearing home bi-pap and uses specialty wheelchair due to hx of MD. Dr. Stark Jock at bedside during triage

## 2020-02-07 ENCOUNTER — Encounter (HOSPITAL_COMMUNITY): Payer: Self-pay | Admitting: Family Medicine

## 2020-02-07 ENCOUNTER — Observation Stay (HOSPITAL_COMMUNITY): Payer: Managed Care, Other (non HMO)

## 2020-02-07 DIAGNOSIS — J982 Interstitial emphysema: Principal | ICD-10-CM

## 2020-02-07 LAB — CBC
HCT: 37.3 % — ABNORMAL LOW (ref 39.0–52.0)
Hemoglobin: 12.2 g/dL — ABNORMAL LOW (ref 13.0–17.0)
MCH: 28.7 pg (ref 26.0–34.0)
MCHC: 32.7 g/dL (ref 30.0–36.0)
MCV: 87.8 fL (ref 80.0–100.0)
Platelets: 166 10*3/uL (ref 150–400)
RBC: 4.25 MIL/uL (ref 4.22–5.81)
RDW: 13.3 % (ref 11.5–15.5)
WBC: 5.9 10*3/uL (ref 4.0–10.5)
nRBC: 0 % (ref 0.0–0.2)

## 2020-02-07 LAB — STREP PNEUMONIAE URINARY ANTIGEN: Strep Pneumo Urinary Antigen: NEGATIVE

## 2020-02-07 LAB — EXPECTORATED SPUTUM ASSESSMENT W GRAM STAIN, RFLX TO RESP C

## 2020-02-07 LAB — PROCALCITONIN: Procalcitonin: <0.1 ng/mL

## 2020-02-07 LAB — CBG MONITORING, ED: Glucose-Capillary: 55 mg/dL — ABNORMAL LOW (ref 70–99)

## 2020-02-07 LAB — GLUCOSE, CAPILLARY
Glucose-Capillary: 118 mg/dL — ABNORMAL HIGH (ref 70–99)
Glucose-Capillary: 58 mg/dL — ABNORMAL LOW (ref 70–99)
Glucose-Capillary: 109 mg/dL — ABNORMAL HIGH (ref 70–99)

## 2020-02-07 LAB — HIV ANTIBODY (ROUTINE TESTING W REFLEX): HIV Screen 4th Generation wRfx: NONREACTIVE

## 2020-02-07 MED ORDER — DEXTROSE 50 % IV SOLN
25.0000 mL | Freq: Once | INTRAVENOUS | Status: AC
  Administered 2020-02-07: 25 mL via INTRAVENOUS
  Filled 2020-02-07: qty 50

## 2020-02-07 MED ORDER — IPRATROPIUM-ALBUTEROL 0.5-2.5 (3) MG/3ML IN SOLN: 3.0000 mL | Freq: Four times a day (QID) | RESPIRATORY_TRACT | Status: DC | PRN

## 2020-02-07 MED ORDER — METOPROLOL TARTRATE 12.5 MG HALF TABLET
12.5000 mg | ORAL_TABLET | Freq: Three times a day (TID) | ORAL | Status: DC
  Administered 2020-02-07 – 2020-02-08 (×3): 12.5 mg via ORAL
  Filled 2020-02-07 (×4): qty 1

## 2020-02-07 MED ORDER — DEXTROSE 50 % IV SOLN
INTRAVENOUS | Status: AC
  Filled 2020-02-07: qty 50

## 2020-02-07 MED ORDER — BENZONATATE 100 MG PO CAPS
100.0000 mg | ORAL_CAPSULE | Freq: Three times a day (TID) | ORAL | Status: DC
  Administered 2020-02-07 – 2020-02-08 (×3): 100 mg via ORAL
  Filled 2020-02-07 (×4): qty 1

## 2020-02-07 MED ORDER — POLYETHYLENE GLYCOL 3350 17 G PO PACK: 17.0000 g | PACK | Freq: Every day | ORAL | Status: DC

## 2020-02-07 MED ORDER — SODIUM CHLORIDE 0.9 % IV SOLN
1.0000 g | INTRAVENOUS | Status: DC
  Administered 2020-02-07 – 2020-02-08 (×2): 1 g via INTRAVENOUS
  Filled 2020-02-07 (×2): qty 1

## 2020-02-07 NOTE — Progress Notes (Signed)
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  He was found to have pneumomediastinum but no other acute findings.  Hemodynamically stable.  Admit to the hospital for monitoring.  Plan: Remains fairly stable today.  Repeat x-ray was ordered and reviewed that shows a stable pneumomediastinum.  Patient probably had airway rupture due to cough and also being on positive pressure ventilation most of the time. Continue monitoring. We will treat patient with antibiotics, presumed bronchitis.  Will start patient on scheduled doses of antitussive to help suppress the cough. Repeat chest x-ray tomorrow morning. Speech therapy consultation given chronic cough and advanced debility to rule out aspiration. Allow regular diet today.  Seen with patient's father at bedside.  All questions were answered.  Anticipate home tomorrow if remains a stable.

## 2020-02-07 NOTE — Progress Notes (Signed)
SLP Cancellation Note  Patient Details Name: Peter Hayes MRN: [id number] DOB: [birth year]   Cancelled treatment:        Attempted to see for BSE, however dad politely declined and feels pt is too sleepy to participate requesting ST return later this date. Dad stated pt coughs frequently but not noted specifically with po's. Pt is on a diet currently. ST will likely be unable to return today and will plan to see pt tomorrow for swallow evaluation.    Houston Siren 02/07/2020, 1:18 PM  Orbie Pyo Colvin Caroli.Ed Risk analyst [phone] Office [phone]

## 2020-02-07 NOTE — H&P (Addendum)
[facility] History and Physical  Kien Mirsky BXI:[id number] DOB: Aug 10, 1996 DOA: 02/06/2020  Referring EDP: Stark Jock PCP: Penni Bombard, PA   Chief Complaint: Cough, chest pain, SOB  HPI: Jayke Caul is a 24 y.o. male with PMH of advanced muscular dystrophy with progressive decline presented to [facility] for chest pain and congestion and transferred for observation of pneumomediastinum.  Patient transferred from [facility]. He was brought to the ED by his father for evaluation of chest pain and congestion. Patient reports worsening congestion over past several months and chest pain in the center of his chest that began yesterday. Chest pain worse with coughing. Reports that he uses BiPap only at night. Dad has also been giving him over-the-counter decongestants with little relief and he was also given amoxicillin which did not help. Denies fevers or sick contacts. Denies headache, dizziness, fever, chills, abdominal pain, nausea, vomiting, diarrhea, constipation, dysuria, hematuria, hematochezia, melena, speech difficulty, confusion or any other complaints.  In the ED: Vitals stable on home BiPap. CBC, BMP WNL. Legionella and Strep Pneumo in progress. CXR: 1. Moderate volume of pneumomediastinum, visualized in the supraclavicular regions, right hilum, and adjacent to the left heart. This may be related to barotrauma from cough. 2. No focal pneumonia.  EDP spoke with CTS who recommended obs admission. Patient was given Azithro and Rocephin.  Review of Systems:  All other systems negative unless noted above in HPI.   Past Medical History:  Diagnosis Date  . Acute respiratory failure with hypoxia and hypercapnia ([place]) 05/16/2018  . HCAP (healthcare-associated pneumonia) 12/01/2017  . High anion gap metabolic acidosis 05/05/210  . Hypotension   . Ileus ([place]) 09/23/2017  . Leukocytosis 05/05/2016  . MD (muscular dystrophy) ([place])   . Refusal of blood product    [name]  .  Respiratory alkalosis 05/05/2016  . Sepsis ([place]) 05/05/2016  . Severe sepsis ([place]) 09/16/2019  . Sinus tachycardia 05/05/2016   Past Surgical History:  Procedure Laterality Date  . COLOSTOMY    . EYE SURGERY    . FLEXIBLE SIGMOIDOSCOPY N/A 01/10/2018   Procedure: FLEXIBLE SIGMOIDOSCOPY;  Surgeon: Laurence Spates, MD;  Location: [facility];  Service: Endoscopy;  Laterality: N/A;  . SMALL INTESTINE SURGERY     Social History:  reports that he has never smoked. He has never used smokeless tobacco. He reports that he does not drink alcohol or use drugs.  Allergies  Allergen Reactions  . Fentanyl Other (See Comments)    Dizziness    No family history on file. No pertinent family history per patient.   Prior to Admission medications   Medication Sig Start Date End Date Taking? Authorizing Provider  acetaminophen (TYLENOL) 325 MG tablet Take 2 tablets (650 mg total) by mouth every 6 (six) hours as needed for mild pain, moderate pain, fever or headache (or temp > 100). 01/15/18   Earnstine Regal, PA-C  feeding supplement, ENSURE ENLIVE, (ENSURE ENLIVE) LIQD You can use whatever supplement he likes.  He needs about 1300 calories per day. 1.5 liters of fluid and 50 grams of protein per day. You can buy this at the grocery or drug store.  You do not need a prescription. Patient taking differently: Take 237 mLs by mouth 2 (two) times daily with a meal. You can use whatever supplement he likes.  He needs about 1300 calories per day. 1.5 liters of fluid and 50 grams of protein per day. You can buy this at the grocery or drug store.  You do  not need a prescription. 01/15/18   Earnstine Regal, PA-C  ferrous sulfate 220 (44 Fe) MG/5ML solution Take 220 mg by mouth daily.    [provider]  ipratropium-albuterol (DUONEB) 0.5-2.5 (3) MG/3ML SOLN Take 3 mLs by nebulization every 6 (six) hours as needed. 09/21/19   Harold Hedge, MD  magnesium oxide (MAG-OX) 400 MG tablet Take 400 mg by mouth 3  (three) times daily.    [provider]  metoprolol tartrate (LOPRESSOR) 25 MG tablet Take 0.5 tablets (12.5 mg total) by mouth 3 (three) times daily. 09/21/19 10/21/19  Harold Hedge, MD  Multiple Vitamin (MULTIVITAMIN WITH MINERALS) TABS tablet You can get him a general multivitamin for daily use at any drug store Patient taking differently: Take 1 tablet by mouth daily.  01/15/18   Earnstine Regal, PA-C  polyethylene glycol [facility] / Floria Raveling) packet Follow package instruction for daily use.  You need to have one soft bowel movement per day.  If he is not doing this call your primary care doctor.  You also need to be sure he takes in 1.5 liters of fluid per day.   He needs protein supplement daily, and needs to take in about 1300 calories per day. Patient taking differently: Take 17 g by mouth daily. Follow package instruction for daily use.  You need to have one soft bowel movement per day.  If he is not doing this call your primary care doctor.  You also need to be sure he takes in 1.5 liters of fluid per day.   He needs protein supplement daily, and needs to take in about 1300 calories per day. 01/15/18   Earnstine Regal, PA-C  Sennosides (PRUNE SENNA CONCENTRATE PO) Take 8 oz by mouth daily as needed (constipation).    [provider]  simethicone (GAS-X) 80 MG chewable tablet Chew 1 tablet (80 mg total) by mouth every 6 (six) hours as needed for flatulence (distention). 06/03/18   Julianne Rice, MD   Physical Exam: Vitals:   02/07/20 0030 02/07/20 0045 02/07/20 0217 02/07/20 0242  BP: (!) 90/58 (!) 97/57 93/63   Pulse: 82 83  83  Resp:  19    Temp:  99 F (37.2 C) (!) 97.4 F (36.3 C)   TempSrc:  Tympanic Axillary   SpO2: 99% 100%  97%  Weight:        Wt Readings from Last 3 Encounters:  02/06/20 34 kg  11/20/19 35.8 kg  11/01/19 37.1 kg    . General:  Appears calm and comfortable. AAOx4. Home BiPAP in place. . Eyes: EOMI, normal lids, irises &  conjunctiva . ENT: grossly normal hearing, lips & tongue . Neck: normal ROM . Cardiovascular: RRR, no m/r/g. No LE edema. Marland Kitchen Respiratory: Mild rhonchi present bilaterally. No respiratory distress.  . Abdomen: soft, ntnd . Skin: no rash or induration seen on limited exam . Musculoskeletal: grossly normal tone BUE/BLE . Psychiatric: grossly normal mood and affect, speech fluent and appropriate . Neurologic: grossly non-focal.          Labs on Admission:  Basic Metabolic Panel: Recent Labs  Lab 02/06/20 1727 02/06/20 2024  NA 137  --   K 4.0  --   CL 99  --   CO2 20*  --   GLUCOSE 54*  --   BUN 14  --   CREATININE <0.30* <0.30*  CALCIUM 9.2  --    Liver Function Tests: No results for input(s): AST, ALT, ALKPHOS, BILITOT, PROT, ALBUMIN  in the last 168 hours. No results for input(s): LIPASE, AMYLASE in the last 168 hours. No results for input(s): AMMONIA in the last 168 hours. CBC: Recent Labs  Lab 02/06/20 1727 02/06/20 2024  WBC 6.7 9.4  NEUTROABS 4.9  --   HGB 14.3 13.7  HCT 42.2 42.5  MCV 85.1 88.2  PLT 100* 215   Cardiac Enzymes: No results for input(s): CKTOTAL, CKMB, CKMBINDEX, TROPONINI in the last 168 hours.  BNP (last 3 results) No results for input(s): BNP in the last 8760 hours.  ProBNP (last 3 results) No results for input(s): PROBNP in the last 8760 hours.  CBG: Recent Labs  Lab 02/06/20 2306 02/07/20 0044 02/07/20 0149  GLUCAP 69* 55* 118*    Radiological Exams on Admission: DG Chest Port 1 View  Result Date: 02/06/2020 CLINICAL DATA:  Cough and congestion. Productive cough since January. Chest pain. Muscular dystrophy. EXAM: PORTABLE CHEST 1 VIEW COMPARISON:  Radiograph 11/20/2019 FINDINGS: Pneumomediastinum visualized in the supraclavicular regions as well as about the left heart border and right hilum. Heart is normal in size. Pulmonary vasculature is normal. No focal consolidation. No obvious pneumothorax, however apices are partially  obscured by soft tissue air. Gracile appearance of the bones. No acute osseous abnormalities are seen. IMPRESSION: 1. Moderate volume of pneumomediastinum, visualized in the supraclavicular regions, right hilum, and adjacent to the left heart. This may be related to barotrauma from cough. 2. No focal pneumonia. These results were called by telephone at the time of interpretation on 02/06/2020 at 6:15 pm to Dr Veryl Speak , who verbally acknowledged these results. Electronically Signed   By: Keith Rake M.D.   On: 02/06/2020 18:16    EKG: Independently reviewed. HR 80. Sinus rhythm. QTc 471. No STEMI.  Assessment/Plan Principal Problem:   Pneumomediastinum (HCC) Active Problems:   Muscular dystrophy (HCC)   Moderate protein-calorie malnutrition (HCC)   Hypoglycemia without diagnosis of diabetes mellitus  24 y.o. male with PMH of advanced muscular dystrophy with progressive decline presented to [facility] for chest pain and congestion and transferred for observation of pneumomediastinum.  Pneumomediastinum - likely related to barotrauma from cough and BiPAP - EDP d/w CTS and recommended obs admission; re-consult PRN - cont home BiPAP qhs - Respiratory therapy consulted - cont inhaler PRN - will not cont abx at this time, low suspicion for infection - f/u legionella, strep pneumo - Procal and repeat CBC ordered - NPO for now during obs but advance when able if patient does not need procedure  - Tele   Muscular Dystrophy Malnutrition Hypoglycemia - consult nutrition PRN if patient requires longer stay - PT/OT not consulted; at baseline  - monitor for hypoglycemia, dextrose PRN  Code Status: Full DVT Prophylaxis: Heparin in case patient needs procedure Family Communication: Father at bedside Disposition Plan: Admit under observation. Patient requiring inpatient observation and possibly further specialty consult. Anticipate discharge home in 1-2 days.  Time spent: 50 minutes  Chauncey Mann, MD [facility] Pager [phone]

## 2020-02-07 NOTE — Progress Notes (Signed)
MD paged again for clarification of PPV with Pneumomediastinum.  No response yet.

## 2020-02-07 NOTE — Plan of Care (Signed)
POC initiated and progressing. 

## 2020-02-07 NOTE — Plan of Care (Signed)
Patient with history of advanced muscular dystrophy with progressive decline on supplemental O2 and intermittent BiPAP who was brought to the ED due to chest pain. ED physician that saw his this evening had seen him a few months ago as well and reported that he appeared in similar condition with similar complaints however given complaint of increasing congestion he ordered a CXR which revealed pneumomediastinum felt to be related to persistent coughing and BiPAP use. CTS called by outside ED who recommended admission. Patient to be transferred to [facility] for continued management of pneumomediastinum.

## 2020-02-07 NOTE — Progress Notes (Signed)
Home PPV in use.  HR 92, Saturation 98%.  Patient is alert and watching television.

## 2020-02-07 NOTE — Progress Notes (Signed)
Patient arrived to the floor via care link. Alert and orient with his home BIPAP. Cardiac monitoring initiated CCMD notified. We'll continue to monitor.

## 2020-02-07 NOTE — Plan of Care (Signed)

## 2020-02-07 NOTE — Progress Notes (Signed)
Spoke with MD.  Patient remains comfortable.  VS are stable.  Will continue with PPV during hours of sleep.

## 2020-02-07 NOTE — Progress Notes (Addendum)
Patient transported and received his home Trilogy device.  Dad is at the bedside.  AVAPS mode in use with 2 liters of oxygen.  Peak pressures between 20 and 25, VT 300-500 ml. [facility] physician paged to verify use of PPV with Pneumomediastinum.  No response from MD yet.  RN informed.  Patient appears comfortable on his home device.  HR 79, Saturation 99% and RR 17.  BP 93/63.

## 2020-02-08 ENCOUNTER — Inpatient Hospital Stay (HOSPITAL_COMMUNITY): Payer: Managed Care, Other (non HMO)

## 2020-02-08 DIAGNOSIS — J982 Interstitial emphysema: Secondary | ICD-10-CM | POA: Diagnosis not present

## 2020-02-08 LAB — GLUCOSE, CAPILLARY
Glucose-Capillary: 59 mg/dL — ABNORMAL LOW (ref 70–99)
Glucose-Capillary: 162 mg/dL — ABNORMAL HIGH (ref 70–99)
Glucose-Capillary: 70 mg/dL (ref 70–99)

## 2020-02-08 LAB — CORTISOL: Cortisol, Plasma: 5.1 ug/dL

## 2020-02-08 MED ORDER — AMOXICILLIN 500 MG PO TABS
500.0000 mg | ORAL_TABLET | Freq: Two times a day (BID) | ORAL | 0 refills | Status: AC
Start: 2020-02-08 — End: 2020-02-13

## 2020-02-08 MED ORDER — SODIUM CHLORIDE 0.9 % IV BOLUS
500.0000 mL | Freq: Once | INTRAVENOUS | Status: AC
  Administered 2020-02-08: 500 mL via INTRAVENOUS

## 2020-02-08 MED ORDER — BENZONATATE 100 MG PO CAPS: 100.0000 mg | ORAL_CAPSULE | Freq: Three times a day (TID) | ORAL | 0 refills | Status: DC | PRN

## 2020-02-08 MED ORDER — DEXTROSE 50 % IV SOLN
INTRAVENOUS | Status: AC
  Filled 2020-02-08: qty 50

## 2020-02-08 NOTE — Discharge Summary (Signed)
Physician Discharge Summary  Peter Hayes ULA:[id number] DOB: 06-Apr-1996 DOA: 02/06/2020  PCP: Penni Bombard, PA  Admit date: 02/06/2020 Discharge date: 02/08/2020  Admitted From: Home Disposition: Home  Recommendations for Outpatient Follow-up:  1. Follow up with PCP as a scheduled.  Keep up with your PICC line appointment for tomorrow at [facility].  Discharge Condition: Fair CODE STATUS: Full code Diet recommendation: Regular diet more frequent eating, more carbohydrate food  Discharge summary: 24 year old gentleman with advanced muscular dystrophy and progressive decline, almost ventilator dependent at home using Trilogy noninvasive positive pressure ventilation most of the time, bedbound who was suffering from cough congestion for the last few months, complained of acute chest pain radiating to shoulders and was brought to the emergency room.  He was noted to have a small pneumomediastinum and was admitted to the hospital for monitoring.  Pneumomediastinum: Probably barotrauma from cough and aggravated by ongoing use of positive pressure ventilator.  CT surgery recommended conservative management, was monitored in the hospital with consecutive x-rays for 3 days and has remained stable.  With no increased oxygen demand.  With no expanding pneumomediastinum. His cough was nonproductive, was started on Tessalon Perles and had symptom improvement.  Treated with antibiotics for suspected bronchitis, will continue 5 more days of amoxicillin. Subsequent x-rays remain stable with a stable amount of air. Underwent modified barium swallow testing, had mild oropharyngeal dysphagia, suggested regular diet.  Progressive muscular dystrophy with debility, severe protein calorie malnutrition: Has plan to start on home TPN by his primary care provider and is scheduled for PICC line placement tomorrow at [facility] that he will keep of the appointment.  His blood sugars  were remaining low because he would sleep most of the time and is skipping meals.  Advised family to feed him high carbohydrate diet to avoid hypoglycemic events and hopefully TPN will be helpful.    Discharge Diagnoses:  Principal Problem:   Pneumomediastinum ([place]) Active Problems:   Muscular dystrophy ([place])   Moderate protein-calorie malnutrition ([place])   Hypoglycemia without diagnosis of diabetes mellitus    Discharge Instructions  Discharge Instructions    Diet general   Complete by: As directed    Discharge instructions   Complete by: As directed    Eat more frequent meals to avoid low blood sugar   Increase activity slowly   Complete by: As directed      Allergies as of 02/08/2020      Reactions   Fentanyl Other (See Comments)   Dizziness      Medication List    TAKE these medications   acetaminophen 325 MG tablet Commonly known as: TYLENOL Take 2 tablets (650 mg total) by mouth every 6 (six) hours as needed for mild pain, moderate pain, fever or headache (or temp > 100).   amoxicillin 500 MG tablet Commonly known as: AMOXIL Take 1 tablet (500 mg total) by mouth 2 (two) times daily for 5 days.   benzonatate 100 MG capsule Commonly known as: TESSALON Take 1 capsule (100 mg total) by mouth 3 (three) times daily as needed for cough.   feeding supplement (ENSURE ENLIVE) Liqd You can use whatever supplement he likes.  He needs about 1300 calories per day. 1.5 liters of fluid and 50 grams of protein per day. You can buy this at the grocery or drug store.  You do not need a prescription.   ferrous sulfate 220 (44 Fe) MG/5ML solution Take 220 mg by mouth  3 (three) times daily with meals.   ipratropium-albuterol 0.5-2.5 (3) MG/3ML Soln Commonly known as: DUONEB Take 3 mLs by nebulization every 6 (six) hours as needed. What changed: reasons to take this   magnesium oxide 400 MG tablet Commonly known as: MAG-OX Take 400 mg by mouth 3 (three) times daily.    metoprolol tartrate 25 MG tablet Commonly known as: LOPRESSOR Take 0.5 tablets (12.5 mg total) by mouth 3 (three) times daily. What changed: how much to take   multivitamin with minerals Tabs tablet You can get him a general multivitamin for daily use at any drug store What changed:   how much to take  how to take this  when to take this  additional instructions   polyethylene glycol 17 g packet Commonly known as: MIRALAX / GLYCOLAX Follow package instruction for daily use.  You need to have one soft bowel movement per day.  If he is not doing this call your primary care doctor.  You also need to be sure he takes in 1.5 liters of fluid per day.   He needs protein supplement daily, and needs to take in about 1300 calories per day. What changed:   how much to take  how to take this  when to take this   PRUNE SENNA CONCENTRATE PO Take 8 oz by mouth daily as needed (constipation).   simethicone 80 MG chewable tablet Commonly known as: Gas-X Chew 1 tablet (80 mg total) by mouth every 6 (six) hours as needed for flatulence (distention).       Allergies  Allergen Reactions  . Fentanyl Other (See Comments)    Dizziness    Consultations:  CT surgery, phone consultation by ED provider   Procedures/Studies: DG CHEST PORT 1 VIEW  Result Date: 02/08/2020 CLINICAL DATA:  Pneumomediastinum EXAM: PORTABLE CHEST 1 VIEW COMPARISON:  Yesterday FINDINGS: Mid to upper bilateral pneumomediastinum tracking into the neck. Stable eft apical crescentic lucency. Hazy right perihilar opacity. Gracile and osteopenic bones. IMPRESSION: 1. Unchanged pneumomediastinum. 2. Trace left pneumothorax versus extrapleural gas that is unchanged from yesterday. 3. Hazy right perihilar opacity, likely atelectasis. Electronically Signed   By: Monte Fantasia M.D.   On: 02/08/2020 06:49   DG CHEST PORT 1 VIEW  Result Date: 02/07/2020 CLINICAL DATA:  Chest pain, congestion, pneumomediastinum, advanced  muscular dystrophy with progressive decline EXAM: PORTABLE CHEST 1 VIEW COMPARISON:  Portable exam 0829 hours compared to 02/06/2020 FINDINGS: Normal heart size and pulmonary vascularity. Pneumomediastinum again identified extending into the cervical region bilaterally. Decreased pneumomediastinum adjacent to the LEFT heart border. Lungs clear. No pulmonary infiltrate, pleural effusion or pneumothorax. Bones demineralized. IMPRESSION: Persistent pneumomediastinum. Electronically Signed   By: Lavonia Dana M.D.   On: 02/07/2020 08:57   DG Chest Port 1 View  Result Date: 02/06/2020 CLINICAL DATA:  Cough and congestion. Productive cough since January. Chest pain. Muscular dystrophy. EXAM: PORTABLE CHEST 1 VIEW COMPARISON:  Radiograph 11/20/2019 FINDINGS: Pneumomediastinum visualized in the supraclavicular regions as well as about the left heart border and right hilum. Heart is normal in size. Pulmonary vasculature is normal. No focal consolidation. No obvious pneumothorax, however apices are partially obscured by soft tissue air. Gracile appearance of the bones. No acute osseous abnormalities are seen. IMPRESSION: 1. Moderate volume of pneumomediastinum, visualized in the supraclavicular regions, right hilum, and adjacent to the left heart. This may be related to barotrauma from cough. 2. No focal pneumonia. These results were called by telephone at the time of interpretation on 02/06/2020  at 6:15 pm to Dr Veryl Speak , who verbally acknowledged these results. Electronically Signed   By: Keith Rake M.D.   On: 02/06/2020 18:16   DG Swallowing Func-Speech Pathology  Result Date: 02/08/2020 Objective Swallowing Evaluation: Type of Study: MBS-Modified Barium Swallow Study  Patient Details Name: Peter Hayes MRN: [id number] Date of Birth: [birth year] Today's Date: 02/08/2020 Time: SLP Start Time (ACUTE ONLY): 1240 -SLP Stop Time (ACUTE ONLY): 1301 SLP Time Calculation (min) (ACUTE ONLY): 21 min Past Medical History: Past  Medical History: Diagnosis Date . Acute respiratory failure with hypoxia and hypercapnia ([place]) 05/16/2018 . HCAP (healthcare-associated pneumonia) 12/01/2017 . High anion gap metabolic acidosis 3/38/2505 . Hypotension  . Ileus ([place]) 09/23/2017 . Leukocytosis 05/05/2016 . MD (muscular dystrophy) ([place])  . Refusal of blood product   [name] . Respiratory alkalosis 05/05/2016 . Sepsis ([place]) 05/05/2016 . Severe sepsis ([place]) 09/16/2019 . Sinus tachycardia 05/05/2016 Past Surgical History: Past Surgical History: Procedure Laterality Date . COLOSTOMY   . EYE SURGERY   . FLEXIBLE SIGMOIDOSCOPY N/A 01/10/2018  Procedure: FLEXIBLE SIGMOIDOSCOPY;  Surgeon: Laurence Spates, MD;  Location: [facility] ENDOSCOPY;  Service: Endoscopy;  Laterality: N/A; . SMALL INTESTINE SURGERY   HPI: Sajjad Honea is a 24 y.o. male with PMH of advanced muscular dystrophy with progressive decline presented to [facility] for chest pain and congestion and transferred for observation of pneumomediastinum.  CXR reported; "Hazy right perihilar opacity, likely atelectasis."  Pt had two FEES in 2019 with the most recent one on 07/30/18 reporting transient laryngeal penetration of thin liquid.   Subjective: pt cooperative with encouragement from father Assessment / Plan / Recommendation CHL IP CLINICAL IMPRESSIONS 02/08/2020 Clinical Impression Pt presents with a mild oropharyngeal dysphagia, although with study limited by amount of intake. Pt took a few very small boluses with encouragement from SLP and father. He has lingual pumping that impacts cohesion of liquids more than solids. Pharyngeally he has reduced base of tongue retraction, hyolaryngeal movement, and pharyngeal squeeze that results in pharyngeal residue. Pt achieves good laryngeal closure that he can maintain throughout multiple, spontaneous swallows as he works to reduce the residue. Minimal residue remains in the valleculae after he has finished each bolus. Results were reviewed with pt and father  including no aspiration observed on testing. Discussed the potential for apsiration after a meal when returning to BiPAP if pharyngeal residue is still present, but his father says that he waits for a little while after a meal before placing him back on BiPAP. Recommend continuing regular solids and thin liquids. Will f/u briefly for education and additional clinical observation given limited MBS. SLP Visit Diagnosis Dysphagia, oropharyngeal phase (R13.12) Attention and concentration deficit following -- Frontal lobe and executive function deficit following -- Impact on safety and function Mild aspiration risk   CHL IP TREATMENT RECOMMENDATION 02/08/2020 Treatment Recommendations Therapy as outlined in treatment plan below   Prognosis 02/08/2020 Prognosis for Safe Diet Advancement Good Barriers to Reach Goals -- Barriers/Prognosis Comment -- CHL IP DIET RECOMMENDATION 02/08/2020 SLP Diet Recommendations Regular solids;Thin liquid Liquid Administration via Cup;Straw Medication Administration Crushed with puree Compensations Slow rate;Small sips/bites Postural Changes Seated upright at 90 degrees;Remain semi-upright after after feeds/meals (Comment)   CHL IP OTHER RECOMMENDATIONS 02/08/2020 Recommended Consults -- Oral Care Recommendations Oral care QID Other Recommendations --   CHL IP FOLLOW UP RECOMMENDATIONS 02/08/2020 Follow up Recommendations None   CHL IP FREQUENCY AND DURATION 02/08/2020 Speech Therapy Frequency (ACUTE ONLY) min 1 x/week Treatment Duration 1 week  CHL IP ORAL PHASE 02/08/2020 Oral Phase Impaired Oral - Pudding Teaspoon -- Oral - Pudding Cup -- Oral - Honey Teaspoon -- Oral - Honey Cup -- Oral - Nectar Teaspoon -- Oral - Nectar Cup -- Oral - Nectar Straw -- Oral - Thin Teaspoon -- Oral - Thin Cup -- Oral - Thin Straw Decreased bolus cohesion Oral - Puree Lingual pumping;Delayed oral transit Oral - Mech Soft Lingual pumping;Delayed oral transit Oral - Regular -- Oral - Multi-Consistency -- Oral - Pill --  Oral Phase - Comment --  CHL IP PHARYNGEAL PHASE 02/08/2020 Pharyngeal Phase Impaired Pharyngeal- Pudding Teaspoon -- Pharyngeal -- Pharyngeal- Pudding Cup -- Pharyngeal -- Pharyngeal- Honey Teaspoon -- Pharyngeal -- Pharyngeal- Honey Cup -- Pharyngeal -- Pharyngeal- Nectar Teaspoon -- Pharyngeal -- Pharyngeal- Nectar Cup -- Pharyngeal -- Pharyngeal- Nectar Straw -- Pharyngeal -- Pharyngeal- Thin Teaspoon -- Pharyngeal -- Pharyngeal- Thin Cup -- Pharyngeal -- Pharyngeal- Thin Straw Reduced pharyngeal peristalsis;Reduced anterior laryngeal mobility;Reduced laryngeal elevation;Reduced tongue base retraction;Pharyngeal residue - valleculae Pharyngeal -- Pharyngeal- Puree Reduced pharyngeal peristalsis;Reduced anterior laryngeal mobility;Reduced laryngeal elevation;Reduced tongue base retraction;Pharyngeal residue - valleculae Pharyngeal -- Pharyngeal- Mechanical Soft Reduced pharyngeal peristalsis;Reduced anterior laryngeal mobility;Reduced laryngeal elevation;Reduced tongue base retraction;Pharyngeal residue - valleculae Pharyngeal -- Pharyngeal- Regular -- Pharyngeal -- Pharyngeal- Multi-consistency -- Pharyngeal -- Pharyngeal- Pill -- Pharyngeal -- Pharyngeal Comment --  CHL IP CERVICAL ESOPHAGEAL PHASE 02/08/2020 Cervical Esophageal Phase WFL Pudding Teaspoon -- Pudding Cup -- Honey Teaspoon -- Honey Cup -- Nectar Teaspoon -- Nectar Cup -- Nectar Straw -- Thin Teaspoon -- Thin Cup -- Thin Straw -- Puree -- Mechanical Soft -- Regular -- Multi-consistency -- Pill -- Cervical Esophageal Comment -- Osie Bond., M.A. [facility] Pager [phone] Office [phone] 02/08/2020, 1:54 PM                 Subjective: Patient seen and examined.  No overnight events.  Blood pressures recorded low, however he has atrophied limbs and blood pressures are not accurate. Does not sleep at night and usually sleeps in the daytime. Wearing noninvasive positive pressure ventilator most of the time and remains  comfortable. Becomes anxious when taking it off. Father at the bedside.   Discharge Exam: Vitals:   02/08/20 1045 02/08/20 1308  BP: (!) 90/52 130/79  Pulse: 66 (!) 111  Resp: 20 (!) 25  Temp: (!) 96.6 F (35.9 C) (!) 97.4 F (36.3 C)  SpO2: 99% 98%   Vitals:   02/08/20 0453 02/08/20 0800 02/08/20 1045 02/08/20 1308  BP: 106/66 (!) 90/54 (!) 90/52 130/79  Pulse: 82 68 66 (!) 111  Resp:  19 20 (!) 25  Temp:  (!) 96.9 F (36.1 C) (!) 96.6 F (35.9 C) (!) 97.4 F (36.3 C)  TempSrc:  Axillary Axillary Axillary  SpO2: 99% 98% 99% 98%  Weight:        General: Pt is alert, awake on stimulation, not in any distress.  He is talking to the mask.  Chronically sick looking.  Cachectic. Cardiovascular: RRR, S1/S2 +, no rubs, no gallops, Respiratory: CTA bilaterally, no wheezing, no rhonchi, no palpable subcutaneous emphysema. Abdominal: Soft, NT, ND, bowel sounds +, colostomy present. Extremities: Atrophied extremities with contractures, quadriparesis.    The results of significant diagnostics from this hospitalization (including imaging, microbiology, ancillary and laboratory) are listed below for reference.     Microbiology: Recent Results (from the past 240 hour(s))  Respiratory Panel by RT PCR (Flu A&B, Covid) - Nasopharyngeal Swab     Status:  None   Collection Time: 02/06/20  8:24 PM   Specimen: Nasopharyngeal Swab  Result Value Ref Range Status   SARS Coronavirus 2 by RT PCR NEGATIVE NEGATIVE Final    Comment: (NOTE) SARS-CoV-2 target nucleic acids are NOT DETECTED. The SARS-CoV-2 RNA is generally detectable in upper respiratoy specimens during the acute phase of infection. The lowest concentration of SARS-CoV-2 viral copies this assay can detect is 131 copies/mL. A negative result does not preclude SARS-Cov-2 infection and should not be used as the sole basis for treatment or other patient management decisions. A negative result may occur with  improper specimen  collection/handling, submission of specimen other than nasopharyngeal swab, presence of viral mutation(s) within the areas targeted by this assay, and inadequate number of viral copies (<131 copies/mL). A negative result must be combined with clinical observations, patient history, and epidemiological information. The expected result is Negative. Fact Sheet for Patients:  PinkCheek.be Fact Sheet for Healthcare Providers:  GravelBags.it This test is not yet ap proved or cleared by the [place] FDA and  has been authorized for detection and/or diagnosis of SARS-CoV-2 by FDA under an Emergency Use Authorization (EUA). This EUA will remain  in effect (meaning this test can be used) for the duration of the COVID-19 declaration under Section 564(b)(1) of the Act, 21 U.S.C. section 360bbb-3(b)(1), unless the authorization is terminated or revoked sooner.    Influenza A by PCR NEGATIVE NEGATIVE Final   Influenza B by PCR NEGATIVE NEGATIVE Final    Comment: (NOTE) The Xpert Xpress SARS-CoV-2/FLU/RSV assay is intended as an aid in  the diagnosis of influenza from Nasopharyngeal swab specimens and  should not be used as a sole basis for treatment. Nasal washings and  aspirates are unacceptable for Xpert Xpress SARS-CoV-2/FLU/RSV  testing. Fact Sheet for Patients: PinkCheek.be Fact Sheet for Healthcare Providers: GravelBags.it This test is not yet approved or cleared by the [place] FDA and  has been authorized for detection and/or diagnosis of SARS-CoV-2 by  FDA under an Emergency Use Authorization (EUA). This EUA will remain  in effect (meaning this test can be used) for the duration of the  Covid-19 declaration under Section 564(b)(1) of the Act, 21  U.S.C. section 360bbb-3(b)(1), unless the authorization is  terminated or revoked. Performed at [facility],  [address] [place], [place] [address]   Sputum culture     Status: None   Collection Time: 02/06/20  8:24 PM   Specimen: Sputum  Result Value Ref Range Status   Specimen Description SPUTUM  Final   Special Requests   Final    NONE Performed at [facility], [address] [place], [place] [address]    Sputum evaluation   Final    Sputum specimen not acceptable for testing.  Please recollect.   RESULT CALLED TO, READ BACK BY AND VERIFIED WITH: RN Sabino Snipes [place] [facility]    Report Status 02/07/2020 FINAL  Final     Labs: BNP (last 3 results) No results for input(s): BNP in the last 8760 hours. Basic Metabolic Panel: Recent Labs  Lab 02/06/20 1727 02/06/20 2024  NA 137  --   K 4.0  --   CL 99  --   CO2 20*  --   GLUCOSE 54*  --   BUN 14  --   CREATININE <0.30* <0.30*  CALCIUM 9.2  --    Liver Function Tests: No results for input(s): AST, ALT, ALKPHOS, BILITOT, PROT,  ALBUMIN in the last 168 hours. No results for input(s): LIPASE, AMYLASE in the last 168 hours. No results for input(s): AMMONIA in the last 168 hours. CBC: Recent Labs  Lab 02/06/20 1727 02/06/20 2024 02/07/20 0344  WBC 6.7 9.4 5.9  NEUTROABS 4.9  --   --   HGB 14.3 13.7 12.2*  HCT 42.2 42.5 37.3*  MCV 85.1 88.2 87.8  PLT 100* 215 166   Cardiac Enzymes: No results for input(s): CKTOTAL, CKMB, CKMBINDEX, TROPONINI in the last 168 hours. BNP: Invalid input(s): POCBNP CBG: Recent Labs  Lab 02/07/20 0650 02/07/20 2104 02/08/20 0629 02/08/20 0758 02/08/20 1044  GLUCAP 58* 109* 59* 162* 70   D-Dimer No results for input(s): DDIMER in the last 72 hours. Hgb A1c No results for input(s): HGBA1C in the last 72 hours. Lipid Profile No results for input(s): CHOL, HDL, LDLCALC, TRIG, CHOLHDL, LDLDIRECT in the last 72 hours. Thyroid function studies No results for input(s): TSH, T4TOTAL, T3FREE, THYROIDAB in the last 72 hours.  Invalid input(s): FREET3 Anemia work up No  results for input(s): VITAMINB12, FOLATE, FERRITIN, TIBC, IRON, RETICCTPCT in the last 72 hours. Urinalysis    Component Value Date/Time   COLORURINE YELLOW 11/15/2019 1326   APPEARANCEUR CLEAR 11/15/2019 1326   LABSPEC 1.015 11/15/2019 1326   PHURINE 6.5 11/15/2019 1326   GLUCOSEU NEGATIVE 11/15/2019 1326   HGBUR NEGATIVE 11/15/2019 1326   BILIRUBINUR SMALL (A) 11/15/2019 1326   KETONESUR >80 (A) 11/15/2019 1326   PROTEINUR NEGATIVE 11/15/2019 1326   NITRITE NEGATIVE 11/15/2019 1326   LEUKOCYTESUR NEGATIVE 11/15/2019 1326   Sepsis Labs Invalid input(s): PROCALCITONIN,  WBC,  LACTICIDVEN Microbiology Recent Results (from the past 240 hour(s))  Respiratory Panel by RT PCR (Flu A&B, Covid) - Nasopharyngeal Swab     Status: None   Collection Time: 02/06/20  8:24 PM   Specimen: Nasopharyngeal Swab  Result Value Ref Range Status   SARS Coronavirus 2 by RT PCR NEGATIVE NEGATIVE Final    Comment: (NOTE) SARS-CoV-2 target nucleic acids are NOT DETECTED. The SARS-CoV-2 RNA is generally detectable in upper respiratoy specimens during the acute phase of infection. The lowest concentration of SARS-CoV-2 viral copies this assay can detect is 131 copies/mL. A negative result does not preclude SARS-Cov-2 infection and should not be used as the sole basis for treatment or other patient management decisions. A negative result may occur with  improper specimen collection/handling, submission of specimen other than nasopharyngeal swab, presence of viral mutation(s) within the areas targeted by this assay, and inadequate number of viral copies (<131 copies/mL). A negative result must be combined with clinical observations, patient history, and epidemiological information. The expected result is Negative. Fact Sheet for Patients:  PinkCheek.be Fact Sheet for Healthcare Providers:  GravelBags.it This test is not yet ap proved or cleared by  the [place] FDA and  has been authorized for detection and/or diagnosis of SARS-CoV-2 by FDA under an Emergency Use Authorization (EUA). This EUA will remain  in effect (meaning this test can be used) for the duration of the COVID-19 declaration under Section 564(b)(1) of the Act, 21 U.S.C. section 360bbb-3(b)(1), unless the authorization is terminated or revoked sooner.    Influenza A by PCR NEGATIVE NEGATIVE Final   Influenza B by PCR NEGATIVE NEGATIVE Final    Comment: (NOTE) The Xpert Xpress SARS-CoV-2/FLU/RSV assay is intended as an aid in  the diagnosis of influenza from Nasopharyngeal swab specimens and  should not be used as a sole basis for treatment.  Nasal washings and  aspirates are unacceptable for Xpert Xpress SARS-CoV-2/FLU/RSV  testing. Fact Sheet for Patients: PinkCheek.be Fact Sheet for Healthcare Providers: GravelBags.it This test is not yet approved or cleared by the [place] FDA and  has been authorized for detection and/or diagnosis of SARS-CoV-2 by  FDA under an Emergency Use Authorization (EUA). This EUA will remain  in effect (meaning this test can be used) for the duration of the  Covid-19 declaration under Section 564(b)(1) of the Act, 21  U.S.C. section 360bbb-3(b)(1), unless the authorization is  terminated or revoked. Performed at [facility], [address] [place], [place] [address]   Sputum culture     Status: None   Collection Time: 02/06/20  8:24 PM   Specimen: Sputum  Result Value Ref Range Status   Specimen Description SPUTUM  Final   Special Requests   Final    NONE Performed at [facility], [address] [place], [place] [address]    Sputum evaluation   Final    Sputum specimen not acceptable for testing.  Please recollect.   RESULT CALLED TO, READ BACK BY AND VERIFIED WITH: RN Sabino Snipes [id number] [place]    Report Status 02/07/2020 FINAL   Final     Time coordinating discharge:  35 minutes  SIGNED:   Barb Merino, MD  [facility] 02/08/2020, 2:33 PM

## 2020-02-08 NOTE — Evaluation (Signed)
Clinical/Bedside Swallow Evaluation Patient Details  Name: Peter Hayes MRN: [id number] Date of Birth: Nov 24, 1995  Today's Date: 02/08/2020 Time: SLP Start Time (ACUTE ONLY): 3299 SLP Stop Time (ACUTE ONLY): 0847 SLP Time Calculation (min) (ACUTE ONLY): 36 min  Past Medical History:  Past Medical History:  Diagnosis Date  . Acute respiratory failure with hypoxia and hypercapnia ([place]) 05/16/2018  . HCAP (healthcare-associated pneumonia) 12/01/2017  . High anion gap metabolic acidosis 2/42/6834  . Hypotension   . Ileus ([place]) 09/23/2017  . Leukocytosis 05/05/2016  . MD (muscular dystrophy) ([place])   . Refusal of blood product    patient is Peter Hayes witness  . Respiratory alkalosis 05/05/2016  . Sepsis ([place]) 05/05/2016  . Severe sepsis ([place]) 09/16/2019  . Sinus tachycardia 05/05/2016   Past Surgical History:  Past Surgical History:  Procedure Laterality Date  . COLOSTOMY    . EYE SURGERY    . FLEXIBLE SIGMOIDOSCOPY N/A 01/10/2018   Procedure: FLEXIBLE SIGMOIDOSCOPY;  Surgeon: Laurence Spates, MD;  Location: [facility];  Service: Endoscopy;  Laterality: N/A;  . SMALL INTESTINE SURGERY     HPI:  Peter Hayes is a 24 y.o. male with PMH of advanced muscular dystrophy with progressive decline presented to [facility] for chest pain and congestion and transferred for observation of pneumomediastinum.  CXR reported; "Hazy right perihilar opacity, likely atelectasis."  Pt participated in two FEES in 2019 with the most recent one on 07/30/18 reporting transient laryngeal penetration of thin liquid.     Assessment / Plan / Recommendation Clinical Impression  Pt was seen for a bedside swallow evaluation and he presents with suspected oropharyngeal dysphagia.  Pt has a hx of mild dysphagia.  The pt reported that he consumes two small meals per day on average and the pt's father reported that he would be receiving TPN for additional nutrition soon.  Pt was on BiPAP upon SLP arrival and he transitioned to the nasal  cannula (2L) during this evaluation without a change in oxygen saturation.  Low vocal volume was also observed throughout this evaluation. Oral mechanism exam was unremarkable. Pt was seen with trials of ice chips, thin liquid, puree, and regular solids.  He exhibited multiple swallows per bolus with all trials (possibly indicative of pharyngeal residue) and an immediate throat clear was observed following thin liquid and puree trials.  Mastication of regular solids was mildly prolonged but it was effective and no oral residue was observed.  No overt s/sx of aspiration were observed with ice chips or regular solids.  Recommend an instrumental swallow study to further evaluate swallow function and to help determine the safest/least restrictive diet.  Will plan for MBS later today.  Would recommend continuation of current diet with medications administered crushed in puree until MBS is completed.    SLP Visit Diagnosis: Dysphagia, unspecified (R13.10)    Aspiration Risk  Mild aspiration risk    Diet Recommendation Regular;Thin liquid   Liquid Administration via: Straw;Cup Medication Administration: Crushed with puree Supervision: Staff to assist with self feeding;Full supervision/cueing for compensatory strategies Compensations: Slow rate;Small sips/bites Postural Changes: Seated upright at 90 degrees    Other  Recommendations Oral Care Recommendations: Oral care BID   Follow up Recommendations Other (comment)(TBD)      Frequency and Duration min 2x/week  2 weeks       Prognosis Prognosis for Safe Diet Advancement: Good      Swallow Study   General HPI: Peter Hayes is a 24 y.o. male with PMH of advanced muscular  dystrophy with progressive decline presented to [facility] for chest pain and congestion and transferred for observation of pneumomediastinum.  CXR reported; "Hazy right perihilar opacity, likely atelectasis."  Pt had two FEES in 2019 with the most recent one on 07/30/18 reporting  transient laryngeal penetration of thin liquid.   Type of Study: Bedside Swallow Evaluation Previous Swallow Assessment: See HPI Diet Prior to this Study: Regular;Thin liquids Temperature Spikes Noted: No Respiratory Status: Nasal cannula;Other (comment)(BiPAP) History of Recent Intubation: No Behavior/Cognition: Alert;Cooperative Oral Cavity Assessment: Within Functional Limits Oral Care Completed by SLP: No Oral Cavity - Dentition: Adequate natural dentition Self-Feeding Abilities: Needs assist Patient Positioning: Upright in bed Baseline Vocal Quality: Low vocal intensity Volitional Cough: Weak Volitional Swallow: Able to elicit    Oral/Motor/Sensory Function Overall Oral Motor/Sensory Function: Within functional limits   Ice Chips Ice chips: Within functional limits Presentation: Spoon   Thin Liquid Thin Liquid: Impaired Presentation: Straw;Spoon Pharyngeal  Phase Impairments: Multiple swallows;Suspected delayed Swallow;Throat Clearing - Immediate    Nectar Thick Nectar Thick Liquid: Not tested   Honey Thick Honey Thick Liquid: Not tested   Puree Puree: Impaired Presentation: Spoon Pharyngeal Phase Impairments: Multiple swallows;Throat Clearing - Immediate   Solid     Solid: Impaired Presentation: Spoon Oral Phase Impairments: Impaired mastication Oral Phase Functional Implications: Impaired mastication Pharyngeal Phase Impairments: Multiple swallows     Peter Hayes M.S., CCC-SLP Acute Rehabilitation Services Office: [phone]  Peter Hayes Peter Hayes 02/08/2020,9:19 AM

## 2020-02-08 NOTE — Progress Notes (Signed)
Pt back to rm 27 from MSB. Pt insisted to sleep a little more. VS checked. Pt is on bi-pap.   Lavenia Atlas, RN

## 2020-02-08 NOTE — Progress Notes (Signed)
D/c tele and iv. Went over AVS with pt and pt's father-guardian. The father verbalized understanding and all questions were answered. Pt going home on 2L as his basline.   Lavenia Atlas, RN

## 2020-02-08 NOTE — Progress Notes (Signed)
Pt's BP dropped to the 70s after taking his metoprolol at 2200. MD notified and 500 ml bolus ordered. Pt BP has progressively improved now it is 106/66. Pt resting in bed we will continue to monitor.

## 2020-02-08 NOTE — Progress Notes (Signed)
Temp was 96.43F. BG=70. Focus assessment performed. MD aware   Lavenia Atlas, RN

## 2020-02-08 NOTE — Progress Notes (Signed)
Modified Barium Swallow Progress Note  Patient Details  Name: Peter Hayes MRN: [id number] Date of Birth: [birth year]  Today's Date: 02/08/2020  Modified Barium Swallow completed.  Full report located under Chart Review in the Imaging Section.  Brief recommendations include the following:  Clinical Impression  Pt presents with a mild oropharyngeal dysphagia, although with study limited by amount of intake. Pt took a few very small boluses with encouragement from SLP and father. He has lingual pumping that impacts cohesion of liquids more than solids. Pharyngeally he has reduced base of tongue retraction, hyolaryngeal movement, and pharyngeal squeeze that results in pharyngeal residue. Pt achieves good laryngeal closure that he can maintain throughout multiple, spontaneous swallows as he works to reduce the residue. Minimal residue remains in the valleculae after he has finished each bolus. Results were reviewed with pt and father including no aspiration observed on testing. Discussed the potential for apsiration after a meal when returning to BiPAP if pharyngeal residue is still present, but his father says that he waits for a little while after a meal before placing him back on BiPAP. Recommend continuing regular solids and thin liquids. Will f/u briefly for education and additional clinical observation given limited MBS.   Swallow Evaluation Recommendations       SLP Diet Recommendations: Regular solids;Thin liquid   Liquid Administration via: Cup;Straw   Medication Administration: Crushed with puree   Supervision: Staff to assist with self feeding   Compensations: Slow rate;Small sips/bites   Postural Changes: Seated upright at 90 degrees;Remain semi-upright after after feeds/meals (Comment)   Oral Care Recommendations: Oral care QID         Osie Bond., M.A. [place] Pager [phone] Office [phone]  02/08/2020,1:50 PM

## 2020-02-09 LAB — LEGIONELLA PNEUMOPHILA SEROGP 1 UR AG: L. pneumophila Serogp 1 Ur Ag: NEGATIVE

## 2020-02-09 LAB — GLUCOSE, CAPILLARY: Glucose-Capillary: 62 mg/dL — ABNORMAL LOW (ref 70–99)

## 2020-06-14 ENCOUNTER — Inpatient Hospital Stay (HOSPITAL_BASED_OUTPATIENT_CLINIC_OR_DEPARTMENT_OTHER)
Admission: EM | Admit: 2020-06-14 | Discharge: 2020-06-21 | DRG: 871 | Disposition: A | Discharge status: Home / Self Care | Payer: Managed Care, Other (non HMO) | Attending: Family Medicine | Admitting: Family Medicine

## 2020-06-14 ENCOUNTER — Encounter (HOSPITAL_BASED_OUTPATIENT_CLINIC_OR_DEPARTMENT_OTHER): Payer: Self-pay | Admitting: *Deleted

## 2020-06-14 ENCOUNTER — Emergency Department (HOSPITAL_BASED_OUTPATIENT_CLINIC_OR_DEPARTMENT_OTHER): Payer: Managed Care, Other (non HMO)

## 2020-06-14 ENCOUNTER — Other Ambulatory Visit: Payer: Self-pay

## 2020-06-14 DIAGNOSIS — D509 Iron deficiency anemia, unspecified: Secondary | ICD-10-CM | POA: Diagnosis present

## 2020-06-14 DIAGNOSIS — J9621 Acute and chronic respiratory failure with hypoxia: Secondary | ICD-10-CM | POA: Diagnosis present

## 2020-06-14 DIAGNOSIS — K760 Fatty (change of) liver, not elsewhere classified: Secondary | ICD-10-CM | POA: Diagnosis present

## 2020-06-14 DIAGNOSIS — Z681 Body mass index (BMI) 19 or less, adult: Secondary | ICD-10-CM

## 2020-06-14 DIAGNOSIS — J90 Pleural effusion, not elsewhere classified: Secondary | ICD-10-CM | POA: Diagnosis present

## 2020-06-14 DIAGNOSIS — Z885 Allergy status to narcotic agent status: Secondary | ICD-10-CM

## 2020-06-14 DIAGNOSIS — R188 Other ascites: Secondary | ICD-10-CM | POA: Diagnosis present

## 2020-06-14 DIAGNOSIS — J9811 Atelectasis: Secondary | ICD-10-CM | POA: Diagnosis present

## 2020-06-14 DIAGNOSIS — E8889 Other specified metabolic disorders: Secondary | ICD-10-CM | POA: Diagnosis present

## 2020-06-14 DIAGNOSIS — Z933 Colostomy status: Secondary | ICD-10-CM

## 2020-06-14 DIAGNOSIS — A419 Sepsis, unspecified organism: Principal | ICD-10-CM | POA: Diagnosis present

## 2020-06-14 DIAGNOSIS — E872 Acidosis: Secondary | ICD-10-CM | POA: Diagnosis not present

## 2020-06-14 DIAGNOSIS — R6521 Severe sepsis with septic shock: Secondary | ICD-10-CM | POA: Diagnosis present

## 2020-06-14 DIAGNOSIS — Z20822 Contact with and (suspected) exposure to covid-19: Secondary | ICD-10-CM | POA: Diagnosis present

## 2020-06-14 DIAGNOSIS — G7100 Muscular dystrophy, unspecified: Secondary | ICD-10-CM | POA: Diagnosis not present

## 2020-06-14 DIAGNOSIS — D696 Thrombocytopenia, unspecified: Secondary | ICD-10-CM | POA: Diagnosis present

## 2020-06-14 DIAGNOSIS — E860 Dehydration: Secondary | ICD-10-CM | POA: Diagnosis present

## 2020-06-14 DIAGNOSIS — R509 Fever, unspecified: Secondary | ICD-10-CM | POA: Diagnosis present

## 2020-06-14 DIAGNOSIS — G7101 Duchenne or Becker muscular dystrophy: Secondary | ICD-10-CM | POA: Diagnosis present

## 2020-06-14 DIAGNOSIS — E874 Mixed disorder of acid-base balance: Secondary | ICD-10-CM | POA: Diagnosis present

## 2020-06-14 DIAGNOSIS — E162 Hypoglycemia, unspecified: Secondary | ICD-10-CM | POA: Diagnosis present

## 2020-06-14 DIAGNOSIS — J189 Pneumonia, unspecified organism: Secondary | ICD-10-CM

## 2020-06-14 DIAGNOSIS — E876 Hypokalemia: Secondary | ICD-10-CM | POA: Diagnosis present

## 2020-06-14 DIAGNOSIS — R0602 Shortness of breath: Secondary | ICD-10-CM | POA: Diagnosis not present

## 2020-06-14 DIAGNOSIS — E43 Unspecified severe protein-calorie malnutrition: Secondary | ICD-10-CM | POA: Diagnosis present

## 2020-06-14 DIAGNOSIS — R571 Hypovolemic shock: Secondary | ICD-10-CM | POA: Diagnosis present

## 2020-06-14 DIAGNOSIS — Z9911 Dependence on respirator [ventilator] status: Secondary | ICD-10-CM

## 2020-06-14 DIAGNOSIS — J9611 Chronic respiratory failure with hypoxia: Secondary | ICD-10-CM | POA: Diagnosis not present

## 2020-06-14 DIAGNOSIS — J9600 Acute respiratory failure, unspecified whether with hypoxia or hypercapnia: Secondary | ICD-10-CM

## 2020-06-14 LAB — COMPREHENSIVE METABOLIC PANEL
ALT: 165 U/L — ABNORMAL HIGH (ref 0–44)
AST: 183 U/L — ABNORMAL HIGH (ref 15–41)
Albumin: 4.4 g/dL (ref 3.5–5.0)
Alkaline Phosphatase: 174 U/L — ABNORMAL HIGH (ref 38–126)
Anion gap: 19 — ABNORMAL HIGH (ref 5–15)
BUN: 27 mg/dL — ABNORMAL HIGH (ref 6–20)
CO2: 18 mmol/L — ABNORMAL LOW (ref 22–32)
Calcium: 9.4 mg/dL (ref 8.9–10.3)
Chloride: 99 mmol/L (ref 98–111)
Creatinine, Ser: <0.3 mg/dL — ABNORMAL LOW (ref 0.61–1.24)
Glucose, Bld: 39 mg/dL — CL (ref 70–99)
Potassium: 3.4 mmol/L — ABNORMAL LOW (ref 3.5–5.1)
Sodium: 136 mmol/L (ref 135–145)
Total Bilirubin: 2.6 mg/dL — ABNORMAL HIGH (ref 0.3–1.2)
Total Protein: 8.3 g/dL — ABNORMAL HIGH (ref 6.5–8.1)

## 2020-06-14 LAB — CBC WITH DIFFERENTIAL/PLATELET
Abs Immature Granulocytes: 0.8 10*3/uL — ABNORMAL HIGH (ref 0.00–0.07)
Basophils Absolute: 0.1 10*3/uL (ref 0.0–0.1)
Basophils Relative: 0 %
Eosinophils Absolute: 0 10*3/uL (ref 0.0–0.5)
Eosinophils Relative: 0 %
HCT: 41.1 % (ref 39.0–52.0)
Hemoglobin: 13.1 g/dL (ref 13.0–17.0)
Immature Granulocytes: 3 %
Lymphocytes Relative: 4 %
Lymphs Abs: 1.3 10*3/uL (ref 0.7–4.0)
MCH: 26.4 pg (ref 26.0–34.0)
MCHC: 31.9 g/dL (ref 30.0–36.0)
MCV: 82.9 fL (ref 80.0–100.0)
Monocytes Absolute: 3.7 10*3/uL — ABNORMAL HIGH (ref 0.1–1.0)
Monocytes Relative: 12 %
Neutro Abs: 25.2 10*3/uL — ABNORMAL HIGH (ref 1.7–7.7)
Neutrophils Relative %: 81 %
Platelets: 237 10*3/uL (ref 150–400)
RBC: 4.96 MIL/uL (ref 4.22–5.81)
RDW: 15.2 % (ref 11.5–15.5)
WBC: 31 10*3/uL — ABNORMAL HIGH (ref 4.0–10.5)
nRBC: 0 % (ref 0.0–0.2)

## 2020-06-14 LAB — PROTIME-INR
INR: 1.3 — ABNORMAL HIGH (ref 0.8–1.2)
Prothrombin Time: 15.6 seconds — ABNORMAL HIGH (ref 11.4–15.2)

## 2020-06-14 LAB — URINALYSIS, ROUTINE W REFLEX MICROSCOPIC
Bilirubin Urine: NEGATIVE
Glucose, UA: NEGATIVE mg/dL
Hgb urine dipstick: NEGATIVE
Ketones, ur: 40 mg/dL — AB
Leukocytes,Ua: NEGATIVE
Nitrite: NEGATIVE
Protein, ur: NEGATIVE mg/dL
Specific Gravity, Urine: >1.03 — ABNORMAL HIGH (ref 1.005–1.030)
pH: 5.5 (ref 5.0–8.0)

## 2020-06-14 LAB — HEPATITIS PANEL, ACUTE
HCV Ab: NONREACTIVE
Hep A IgM: NONREACTIVE
Hep B C IgM: NONREACTIVE
Hepatitis B Surface Ag: NONREACTIVE

## 2020-06-14 LAB — LIPASE, BLOOD: Lipase: 17 U/L (ref 11–51)

## 2020-06-14 LAB — CBG MONITORING, ED
Glucose-Capillary: 116 mg/dL — ABNORMAL HIGH (ref 70–99)
Glucose-Capillary: 67 mg/dL — ABNORMAL LOW (ref 70–99)

## 2020-06-14 LAB — TROPONIN I (HIGH SENSITIVITY): Troponin I (High Sensitivity): 8 ng/L (ref <18)

## 2020-06-14 LAB — SARS CORONAVIRUS 2 BY RT PCR (HOSPITAL ORDER, PERFORMED IN ~~LOC~~ HOSPITAL LAB): SARS Coronavirus 2: NEGATIVE

## 2020-06-14 LAB — APTT: aPTT: 35 seconds (ref 24–36)

## 2020-06-14 LAB — LACTIC ACID, PLASMA: Lactic Acid, Venous: 1.1 mmol/L (ref 0.5–1.9)

## 2020-06-14 MED ORDER — DEXTROSE-NACL 5-0.9 % IV SOLN: INTRAVENOUS | Status: DC

## 2020-06-14 MED ORDER — ACETAMINOPHEN 650 MG RE SUPP: 650.0000 mg | Freq: Four times a day (QID) | RECTAL | Status: DC | PRN

## 2020-06-14 MED ORDER — ONDANSETRON HCL 4 MG PO TABS: 4.0000 mg | ORAL_TABLET | Freq: Four times a day (QID) | ORAL | Status: DC | PRN

## 2020-06-14 MED ORDER — SODIUM CHLORIDE 0.9 % IV BOLUS
500.0000 mL | Freq: Once | INTRAVENOUS | Status: AC
  Administered 2020-06-14: 500 mL via INTRAVENOUS

## 2020-06-14 MED ORDER — METRONIDAZOLE IN NACL 5-0.79 MG/ML-% IV SOLN
500.0000 mg | Freq: Once | INTRAVENOUS | Status: AC
  Administered 2020-06-14: 500 mg via INTRAVENOUS
  Filled 2020-06-14: qty 100

## 2020-06-14 MED ORDER — MAGNESIUM OXIDE 400 (241.3 MG) MG PO TABS
400.0000 mg | ORAL_TABLET | Freq: Three times a day (TID) | ORAL | Status: DC
  Administered 2020-06-14 – 2020-06-21 (×18): 400 mg via ORAL
  Filled 2020-06-14 (×21): qty 1

## 2020-06-14 MED ORDER — VANCOMYCIN HCL 500 MG/100ML IV SOLN
500.0000 mg | INTRAVENOUS | Status: DC
  Filled 2020-06-14 (×2): qty 100

## 2020-06-14 MED ORDER — PIPERACILLIN-TAZOBACTAM 3.375 G IVPB 30 MIN
3.3750 g | Freq: Once | INTRAVENOUS | Status: DC
  Filled 2020-06-14: qty 50

## 2020-06-14 MED ORDER — VANCOMYCIN HCL IN DEXTROSE 1-5 GM/200ML-% IV SOLN: 1000.0000 mg | Freq: Once | INTRAVENOUS | Status: DC

## 2020-06-14 MED ORDER — ENOXAPARIN SODIUM 30 MG/0.3ML ~~LOC~~ SOLN
30.0000 mg | SUBCUTANEOUS | Status: DC
  Administered 2020-06-14 – 2020-06-20 (×7): 30 mg via SUBCUTANEOUS
  Filled 2020-06-14 (×7): qty 0.3

## 2020-06-14 MED ORDER — IPRATROPIUM-ALBUTEROL 0.5-2.5 (3) MG/3ML IN SOLN
3.0000 mL | Freq: Four times a day (QID) | RESPIRATORY_TRACT | Status: DC | PRN
  Administered 2020-06-18 – 2020-06-20 (×4): 3 mL via RESPIRATORY_TRACT
  Filled 2020-06-14 (×5): qty 3

## 2020-06-14 MED ORDER — VANCOMYCIN HCL 500 MG IV SOLR
500.0000 mg | Freq: Once | INTRAVENOUS | Status: AC
  Administered 2020-06-14: 500 mg via INTRAVENOUS
  Filled 2020-06-14: qty 500

## 2020-06-14 MED ORDER — DEXTROSE-NACL 5-0.9 % IV SOLN: INTRAVENOUS | Status: AC

## 2020-06-14 MED ORDER — SODIUM CHLORIDE 0.9 % IV SOLN
2.0000 g | Freq: Once | INTRAVENOUS | Status: AC
  Administered 2020-06-14: 2 g via INTRAVENOUS
  Filled 2020-06-14: qty 2

## 2020-06-14 MED ORDER — METOPROLOL TARTRATE 25 MG PO TABS
25.0000 mg | ORAL_TABLET | Freq: Three times a day (TID) | ORAL | Status: DC
  Administered 2020-06-14 – 2020-06-15 (×4): 25 mg via ORAL
  Filled 2020-06-14 (×6): qty 1

## 2020-06-14 MED ORDER — SODIUM CHLORIDE 0.9 % IV SOLN: 2.0000 g | Freq: Two times a day (BID) | INTRAVENOUS | Status: DC

## 2020-06-14 MED ORDER — POTASSIUM CHLORIDE 10 MEQ/100ML IV SOLN
10.0000 meq | INTRAVENOUS | Status: AC
  Filled 2020-06-14: qty 100

## 2020-06-14 MED ORDER — POLYETHYLENE GLYCOL 3350 17 G PO PACK
17.0000 g | PACK | Freq: Every day | ORAL | Status: DC
  Administered 2020-06-16 – 2020-06-20 (×4): 17 g via ORAL
  Filled 2020-06-14 (×5): qty 1

## 2020-06-14 MED ORDER — SODIUM CHLORIDE 0.9% FLUSH
3.0000 mL | Freq: Two times a day (BID) | INTRAVENOUS | Status: DC
  Administered 2020-06-14 – 2020-06-21 (×12): 3 mL via INTRAVENOUS

## 2020-06-14 MED ORDER — VANCOMYCIN HCL 500 MG IV SOLR
500.0000 mg | INTRAVENOUS | Status: DC
  Filled 2020-06-14: qty 500

## 2020-06-14 MED ORDER — IBUPROFEN 400 MG PO TABS: 600.0000 mg | ORAL_TABLET | Freq: Once | ORAL | Status: DC

## 2020-06-14 MED ORDER — DEXTROSE 50 % IV SOLN
1.0000 | Freq: Once | INTRAVENOUS | Status: AC
  Administered 2020-06-14: 25 mL via INTRAVENOUS
  Filled 2020-06-14: qty 50

## 2020-06-14 MED ORDER — ACETAMINOPHEN 325 MG PO TABS
650.0000 mg | ORAL_TABLET | Freq: Four times a day (QID) | ORAL | Status: DC | PRN
  Filled 2020-06-14: qty 2

## 2020-06-14 MED ORDER — DEXTROSE 50 % IV SOLN: 25.0000 mL | Freq: Once | INTRAVENOUS | Status: AC

## 2020-06-14 MED ORDER — ONDANSETRON HCL 4 MG/2ML IJ SOLN: 4.0000 mg | Freq: Four times a day (QID) | INTRAMUSCULAR | Status: DC | PRN

## 2020-06-14 MED ORDER — SODIUM CHLORIDE 0.9 % IV SOLN
2.0000 g | INTRAVENOUS | Status: DC
  Administered 2020-06-14 – 2020-06-15 (×2): 2 g via INTRAVENOUS
  Filled 2020-06-14: qty 2
  Filled 2020-06-14: qty 20

## 2020-06-14 MED ORDER — ORAL CARE MOUTH RINSE
15.0000 mL | Freq: Two times a day (BID) | OROMUCOSAL | Status: DC
  Administered 2020-06-15 – 2020-06-20 (×7): 15 mL via OROMUCOSAL

## 2020-06-14 MED ORDER — MORPHINE SULFATE (PF) 2 MG/ML IV SOLN
1.0000 mg | Freq: Once | INTRAVENOUS | Status: AC
  Administered 2020-06-14: 1 mg via INTRAVENOUS
  Filled 2020-06-14: qty 1

## 2020-06-14 MED ORDER — VANCOMYCIN HCL 500 MG IV SOLR
INTRAVENOUS | Status: AC
  Filled 2020-06-14: qty 500

## 2020-06-14 MED ORDER — METRONIDAZOLE IN NACL 5-0.79 MG/ML-% IV SOLN
500.0000 mg | Freq: Three times a day (TID) | INTRAVENOUS | Status: DC
  Administered 2020-06-15: 500 mg via INTRAVENOUS
  Filled 2020-06-14 (×2): qty 100

## 2020-06-14 MED ORDER — CHLORHEXIDINE GLUCONATE CLOTH 2 % EX PADS
6.0000 | MEDICATED_PAD | Freq: Every day | CUTANEOUS | Status: DC
  Administered 2020-06-15 – 2020-06-20 (×5): 6 via TOPICAL

## 2020-06-14 MED ORDER — CHLORHEXIDINE GLUCONATE 0.12 % MT SOLN
15.0000 mL | Freq: Two times a day (BID) | OROMUCOSAL | Status: DC
  Administered 2020-06-15 – 2020-06-21 (×12): 15 mL via OROMUCOSAL
  Filled 2020-06-14 (×12): qty 15

## 2020-06-14 MED ORDER — DEXTROSE 50 % IV SOLN
INTRAVENOUS | Status: AC
  Administered 2020-06-14: 25 mL via INTRAVENOUS
  Filled 2020-06-14: qty 50

## 2020-06-14 NOTE — ED Notes (Signed)
Consult for hospitalist requested from [facility]

## 2020-06-14 NOTE — ED Notes (Signed)
Dr Melina Copa approved beginning IV abt after BCx1 from PICC

## 2020-06-14 NOTE — ED Provider Notes (Signed)
[facility] EMERGENCY DEPARTMENT Provider Note   CSN: [id number] Arrival date & time: 06/14/20  1134     History Chief Complaint  Patient presents with  . Emesis  . Fever    Peter Hayes is a 24 y.o. male.  HPI Patient with significant medical history of muscular dystrophy, pneumomediastinum, on a trilogy noninvasive positive pressure ventilation set at 2 L, presents to the emergency department with chief complaint of nausea and general body aches.  Patient states he woke up this morning and felt like he has pain all over his body, he also has felt nauseous but denies actually vomiting.  He also admits to subjective fevers and chills.  He denies nasal congestion, throat pain, cough, shortness of breath, increased respiratory demand, or diarrhea.  He is not currently Covid vaccinated, denies any recent sick contacts.  Patient denies any alleviating or aggravating factors.  Patient denies headache, fever, chills, shortness of breath, chest pain, dysuria, pedal edema.    Past Medical History:  Diagnosis Date  . Acute respiratory failure with hypoxia and hypercapnia ([place]) 05/16/2018  . HCAP (healthcare-associated pneumonia) 12/01/2017  . High anion gap metabolic acidosis 04/15/6282  . Hypotension   . Ileus ([place]) 09/23/2017  . Leukocytosis 05/05/2016  . MD (muscular dystrophy) ([place])   . Refusal of blood product    [name]  . Respiratory alkalosis 05/05/2016  . Sepsis ([place]) 05/05/2016  . Severe sepsis ([place]) 09/16/2019  . Sinus tachycardia 05/05/2016    Patient Active Problem List   Diagnosis Date Noted  . Sepsis ([place]) 06/14/2020  . Pneumomediastinum ([place]) 02/06/2020  . Pressure injury of skin 09/17/2019  . 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 Hypoglycemia without diagnosis of diabetes mellitus 09/24/2017  . Muscular dystrophy ([place]) 05/05/2016  . Moderate protein-calorie malnutrition ([place]) 05/05/2016    Past Surgical History:    Procedure Laterality Date  . COLOSTOMY    . EYE SURGERY    . FLEXIBLE SIGMOIDOSCOPY N/A 01/10/2018   Procedure: FLEXIBLE SIGMOIDOSCOPY;  Surgeon: Laurence Spates, MD;  Location: [facility]COPY;  Service: Endoscopy;  Laterality: N/A;  . SMALL INTESTINE SURGERY         No family history on file.  Social History   Tobacco Use  . Smoking status: Never Smoker  . Smokeless tobacco: Never Used  Vaping Use  . Vaping Use: Never used  Substance Use Topics  . Alcohol use: No  . Drug use: No    Home Medications Prior to Admission medications   Medication Sig Start Date End Date Taking? Authorizing Provider  acetaminophen (TYLENOL) 325 MG tablet Take 2 tablets (650 mg total) by mouth every 6 (six) hours as needed for mild pain, moderate pain, fever or headache (or temp > 100). 01/15/18   Earnstine Regal, PA-C  benzonatate (TESSALON) 100 MG capsule Take 1 capsule (100 mg total) by mouth 3 (three) times daily as needed for cough. 02/08/20   Barb Merino, MD  feeding supplement, ENSURE ENLIVE, (ENSURE ENLIVE) LIQD You can use whatever supplement he likes.  He needs about 1300 calories per day. 1.5 liters of fluid and 50 grams of protein per day. You can buy this at the grocery or drug store.  You do not need a prescription. Patient not taking: Reported on 02/07/2020 01/15/18   Earnstine Regal, PA-C  ferrous sulfate 220 (44 Fe) MG/5ML solution Take 220 mg by mouth 3 (three) times daily with meals.     [provider]  ipratropium-albuterol (DUONEB) 0.5-2.5 (3) MG/3ML SOLN Take 3 mLs by nebulization every 6 (six) hours as needed. Patient taking differently: Take 3 mLs by nebulization every 6 (six) hours as needed (breathing / congestion).  09/21/19   Harold Hedge, MD  magnesium oxide (MAG-OX) 400 MG tablet Take 400 mg by mouth 3 (three) times daily.    [provider]  metoprolol tartrate (LOPRESSOR) 25 MG tablet Take 0.5 tablets (12.5 mg total) by mouth 3 (three) times  daily. Patient taking differently: Take 25 mg by mouth 3 (three) times daily.  09/21/19 02/07/20  Harold Hedge, MD  Multiple Vitamin (MULTIVITAMIN WITH MINERALS) TABS tablet You can get him a general multivitamin for daily use at any drug store Patient taking differently: Take 1 tablet by mouth daily.  01/15/18   Earnstine Regal, PA-C  polyethylene glycol [facility] / Floria Raveling) packet Follow package instruction for daily use.  You need to have one soft bowel movement per day.  If he is not doing this call your primary care doctor.  You also need to be sure he takes in 1.5 liters of fluid per day.   He needs protein supplement daily, and needs to take in about 1300 calories per day. Patient taking differently: Take 17 g by mouth daily. Follow package instruction for daily use.  You need to have one soft bowel movement per day.  If he is not doing this call your primary care doctor.  You also need to be sure he takes in 1.5 liters of fluid per day.   He needs protein supplement daily, and needs to take in about 1300 calories per day. 01/15/18   Earnstine Regal, PA-C  Sennosides (PRUNE SENNA CONCENTRATE PO) Take 8 oz by mouth daily as needed (constipation).    [provider]  simethicone (GAS-X) 80 MG chewable tablet Chew 1 tablet (80 mg total) by mouth every 6 (six) hours as needed for flatulence (distention). 06/03/18   Julianne Rice, MD    Allergies    Fentanyl  Review of Systems   Review of Systems  Constitutional: Negative for chills and fever.  HENT: Negative for congestion, tinnitus and trouble swallowing.   Eyes: Negative for visual disturbance.  Respiratory: Negative for cough and shortness of breath.   Cardiovascular: Negative for chest pain.  Gastrointestinal: Positive for nausea. Negative for abdominal pain, diarrhea and vomiting.  Genitourinary: Negative for enuresis, flank pain and frequency.  Musculoskeletal: Positive for myalgias. Negative for back pain.  Skin:  Negative for rash.  Neurological: Negative for dizziness and headaches.  Hematological: Does not bruise/bleed easily.    Physical Exam Updated Vital Signs BP (!) 115/56   Pulse (!) 123   Temp 98.7 F (37.1 C) (Tympanic)   Resp (!) 27   Ht _0  (1.575 m)   Wt 34 kg   SpO2 100%   BMI 13.71 kg/m   Physical Exam Vitals and nursing note reviewed.  Constitutional:      General: He is not in acute distress.    Appearance: He is not ill-appearing.  HENT:     Head: Normocephalic and atraumatic.     Nose: No congestion.     Mouth/Throat:     Mouth: Mucous membranes are moist.     Pharynx: Oropharynx is clear.  Eyes:     General: No scleral icterus. Cardiovascular:     Rate and Rhythm: Normal rate and regular rhythm.     Pulses: Normal pulses.  Heart sounds: No murmur heard.  No friction rub. No gallop.   Pulmonary:     Effort: No respiratory distress.     Breath sounds: No wheezing, rhonchi or rales.     Comments: Harsh breath sounds heard bilaterally, no rales, rhonchi's, stridor or wheezing heard. Abdominal:     General: There is no distension.     Tenderness: There is no abdominal tenderness. There is no right CVA tenderness, left CVA tenderness or guarding.  Musculoskeletal:        General: No swelling or tenderness.  Skin:    General: Skin is warm and dry.     Findings: No rash.  Neurological:     General: No focal deficit present.     Mental Status: He is alert and oriented to person, place, and time.  Psychiatric:        Mood and Affect: Mood normal.     ED Results / Procedures / Treatments   Labs (all labs ordered are listed, but only abnormal results are displayed) Labs Reviewed  COMPREHENSIVE METABOLIC PANEL - Abnormal; Notable for the following components:      Result Value   Potassium 3.4 (*)    CO2 18 (*)    Glucose, Bld 39 (*)    BUN 27 (*)    Creatinine, Ser <0.30 (*)    Total Protein 8.3 (*)    AST 183 (*)    ALT 165 (*)    Alkaline  Phosphatase 174 (*)    Total Bilirubin 2.6 (*)    Anion gap 19 (*)    All other components within normal limits  CBC WITH DIFFERENTIAL/PLATELET - Abnormal; Notable for the following components:   WBC 31.0 (*)    Neutro Abs 25.2 (*)    Monocytes Absolute 3.7 (*)    Abs Immature Granulocytes 0.80 (*)    All other components within normal limits  URINALYSIS, ROUTINE W REFLEX MICROSCOPIC - Abnormal; Notable for the following components:   Specific Gravity, Urine >1.030 (*)    Ketones, ur 40 (*)    All other components within normal limits  PROTIME-INR - Abnormal; Notable for the following components:   Prothrombin Time 15.6 (*)    INR 1.3 (*)    All other components within normal limits  CBG MONITORING, ED - Abnormal; Notable for the following components:   Glucose-Capillary 67 (*)    All other components within normal limits  CBG MONITORING, ED - Abnormal; Notable for the following components:   Glucose-Capillary 116 (*)    All other components within normal limits  SARS CORONAVIRUS 2 BY RT PCR (HOSPITAL ORDER, [facility])  CULTURE, BLOOD (ROUTINE X 2)  CULTURE, BLOOD (ROUTINE X 2)  URINE CULTURE  LIPASE, BLOOD  LACTIC ACID, PLASMA  APTT  PATHOLOGIST SMEAR REVIEW  HEPATITIS PANEL, ACUTE  TROPONIN I (HIGH SENSITIVITY)  TROPONIN I (HIGH SENSITIVITY)    EKG EKG Interpretation  Date/Time:  Tuesday June 14 2020 16:08:58 EDT Ventricular Rate:  120 PR Interval:    QRS Duration: 104 QT Interval:  303 QTC Calculation: 429 R Axis:   91 Text Interpretation: Sinus tachycardia Borderline right axis deviation Borderline T abnormalities, inferior leads ST elevation, consider anterior injury New ST changes otherwise No significant change since last tracing Confirmed by Fredia Sorrow [phone]) on 06/14/2020 4:13:31 PM   Radiology US Abdomen Limited  Result Date: 06/14/2020 CLINICAL DATA:  Elevated liver enzymes EXAM: ULTRASOUND ABDOMEN LIMITED RIGHT  UPPER QUADRANT COMPARISON:  None. FINDINGS: Gallbladder: Gallbladder assessment is somewhat limited due to positioning limits given underlying muscular dystrophy. No gallstones appreciable. No gallbladder wall thickening or pericholecystic fluid. No sonographic Murphy sign noted by sonographer. Common bile duct: Diameter: 3 mm. No intrahepatic or extrahepatic biliary duct dilatation. Liver: No focal lesion identified. Liver echogenicity overall is increased with a somewhat coarsened echotexture. Portal vein is patent on color Doppler imaging with normal direction of blood flow towards the liver. Other: Right pleural effusion evident. IMPRESSION: 1. The liver echogenicity is increased and somewhat coarsened. Suspect underlying parenchymal liver disease with likely superimposed hepatic steatosis. No focal liver lesions are evident; it must be cautioned that the sensitivity of ultrasound for detection of focal liver lesions is somewhat limited in this circumstance. 2. No gallbladder pathology evident with limitations in gallbladder visualization given difficulties with patient positioning. If there remains concern for potential gallbladder pathology, it may be reasonable to consider nuclear medicine hepatobiliary imaging study to assess for cystic duct patency. 3.  Right pleural effusion. Electronically Signed   By: Lowella Grip III M.D.   On: 06/14/2020 15:13   DG Chest Port 1 View  Result Date: 06/14/2020 CLINICAL DATA:  Cough EXAM: PORTABLE CHEST 1 VIEW COMPARISON:  02/08/2020 FINDINGS: Patchy density in the right lung. Left basilar atelectasis. No pleural effusion. No pneumothorax. Stable cardiomediastinal contours. Left PICC line tip overlies the superior right atrium. IMPRESSION: Patchy right lung atelectasis/consolidation. Left basilar atelectasis. Electronically Signed   By: Macy Mis M.D.   On: 06/14/2020 13:30    Procedures .Critical Care Performed by: Marcello Fennel, PA-C Authorized  by: Marcello Fennel, PA-C   Critical care provider statement:    Critical care time (minutes):  45   Critical care time was exclusive of:  Separately billable procedures and treating other patients   Critical care was necessary to treat or prevent imminent or life-threatening deterioration of the following conditions:  Sepsis   Critical care was time spent personally by me on the following activities:  Discussions with consultants, evaluation of patient's response to treatment, examination of patient, ordering and performing treatments and interventions, ordering and review of laboratory studies, ordering and review of radiographic studies, pulse oximetry, re-evaluation of patient's condition, obtaining history from patient or surrogate, review of old charts and discussions with primary provider   I assumed direction of critical care for this patient from another provider in my specialty: yes     (including critical care time)  Medications Ordered in ED Medications  vancomycin (VANCOCIN) 500 MG powder (has no administration in time range)  ceFEPIme (MAXIPIME) 2 g in sodium chloride 0.9 % 100 mL IVPB (has no administration in time range)  vancomycin (VANCOCIN) 500 mg in sodium chloride 0.9 % 100 mL IVPB (has no administration in time range)  dextrose 5 %-0.9 % sodium chloride infusion ( Intravenous New Bag/Given 06/14/20 1625)  dextrose 50 % solution 25 mL (25 mLs Intravenous Given 06/14/20 1412)  dextrose 50 % solution 50 mL (25 mLs Intravenous Given 06/14/20 1436)  morphine 2 MG/ML injection 1 mg (1 mg Intravenous Given 06/14/20 1427)  ceFEPIme (MAXIPIME) 2 g in sodium chloride 0.9 % 100 mL IVPB (0 g Intravenous Stopped 06/14/20 1642)  metroNIDAZOLE (FLAGYL) IVPB 500 mg (0 mg Intravenous Stopped 06/14/20 1749)  sodium chloride 0.9 % bolus 500 mL (0 mLs Intravenous Stopped 06/14/20 1624)  vancomycin (VANCOCIN) 500 mg in sodium chloride 0.9 % 100 mL IVPB (0 mg Intravenous Stopped 06/14/20 1725)  ED  Course  I have reviewed the triage vital signs and the nursing notes.  Pertinent labs & imaging results that were available during my care of the patient were reviewed by me and considered in my medical decision making (see chart for details).  Clinical Course as of Jun 14 1757  Tue Jun 14, 3373  [id number] 24 year old male multiple medical problems including muscular dystrophy on event here with vomiting and fever.  Work-up showing elevated white count and elevated LFTs.  Undergoing bedside ultrasound now.  Blood sugar came back low at 39 given D50.  Started antibiotics.  Will need admission.   [MB]    Clinical Course User Index [MB] Hayden Rasmussen, MD   MDM Rules/Calculators/A&P                          I have personally reviewed all imaging, labs and have interpreted them.  Patient presents with general malaise and nausea.  On exam patient is on noninvasive positive pressure ventilation set at 2 L, sitting in a wheelchair.  He did not appear to be in acute distress, vital signs show tachycardia and tachypnea.  No signs of respiratory failure, no nasal flaring or chest retractions noted.  Lung sounds are harsh sounding but no rales, rhonchi's, wheezing or stridor heard.  Abdomen was nontender to palpation.  Will order chest x-ray, screening labs and a Covid test for further evaluation.   CBC shows leukocytosis, without anemia.  CMP shows glucose of 39,bun 27, AST of 183, ALT of 165, alk phos of 174, total bili 2.6.  Gave patient two amps of D50 and placed him on drip of D5 mixed with saline.  UA negative for nitrates or leukocytes, lactic was 1.1, prothrombin 15.6, INR 1.3.  Chest x-ray shows left and right partial atelectasis.  Limited abdominal ultrasound shows parenchymal liver disease, no gallstone obstruction.  Due to leukocytosis, tachycardia and tachypnea code sepsis was called with an unknown infectious source.  Patient was placed on broad-spectrum antibiotics.  Patient was not hypotensive  and did not start him on fluids.  Contacted hospitalist for further recommendation and evaluation .  Spoke with Dr. Benny Lennert who agrees patient should be admitted for further evaluation.  Patient's TPN pump broke yesterday and we are unable to provide him with TPN here at the emergency department will do in ED to ED transfer where he will get his TPN.  Spoke with Dr. Alvino Chapel at [facility] emergency department who has agreed to accept the patient for a ED to ED transfer.  I have low suspicion for cardiac abnormality as patient denies chest pain, shortness of breath, initial troponin is 8, EKG shows sinus tach without signs of ischemia, no signs of hypoperfusion or fluid overload noted on exam.  Low suspicion for pneumonia as chest x-ray does not show any acute abnormalities.  Low suspicion for UTI or pyelonephritis as patient denies dysuria, urinary frequency, urgency UA negative for nitrates and leukocytes.  Low suspicion for intra-abdominal abnormality requiring surgical intervention as patient denies abdominal pain, nausea, vomiting, no acute abdomen noted on exam.  I suspect the hypoglycemia is a result of his TPN pump not working but it is possible it could been a result of sepsis.  He was provided with glucose and placed on a D5 mix with IV fluids to maintain his glucose levels.  Patient is resting comfortably does not appear to be in acute distress, vital signs remained stable and  is ready for transport.     Final Clinical Impression(s) / ED Diagnoses Final diagnoses:  Sepsis, due to unspecified organism, unspecified whether acute organ dysfunction present [facility])  Hypoglycemia    Rx / DC Orders ED Discharge Orders    None       Marcello Fennel, PA-C 06/14/20 1759    Hayden Rasmussen, MD 06/14/20 1905

## 2020-06-14 NOTE — ED Triage Notes (Signed)
Vomiting and fever. Pale. Symptoms since yesterday. He is on home oxygen. Hx of muscular dystrophy and is on a ventilator.

## 2020-06-14 NOTE — ED Notes (Signed)
Assumed care of patient at this time, nad noted, sr up x2, bed locked and low, call bell w/I reach.  Will continue to monitor.  Patient is on his home ventilator at this time, father at the bedside.

## 2020-06-14 NOTE — Sepsis Progress Note (Signed)
Notified bedside nurse of need to administer antibiotics.  

## 2020-06-14 NOTE — H&P (Signed)
History and Physical    Peter Hayes IWL:[id number] DOB: 1996-08-30 DOA: 06/14/2020  PCP: Penni Bombard, PA  Patient coming from: [place] ED  I have personally briefly reviewed patient's old medical records in [place]  Chief Complaint: Nausea, body aches  HPI: Peter Hayes is a 24 y.o. male with medical history significant for advanced muscular dystrophy with debility, severe protein calorie malnutrition on chronic TPN, chronic respiratory failure on noninvasive positive pressure ventilation who presented to the [place] ED for evaluation of nausea and body aches.  History is supplemented by patient's father at bedside.  Patient was in his usual state of health until early this morning when he began to feel generally unwell.  Later he developed significant nausea without emesis.  He has been having body aches, chills, diaphoresis but denies any subjective fever.  He has been having abdominal pain.  His father reports decreased output from his ostomy.  He has a PICC line in place in his left upper extremity where he receives TPN.  Father states that the TPN pump recently broke.  He has a chronic cough productive of clear sputum.  Peter Hayes,Peter Hayes ED Course:  Initial vitals showed BP 111/51, pulse 115, RR 30, temp 98.7 Fahrenheit, SPO2 98% on noninvasive ventilator.  Labs show WBC 31.0, hemoglobin 13.1, platelets 237,000, sodium 136, potassium 3.4, bicarb 18, BUN 27, creatinine <0.3, serum glucose 39, AST 183, ALT 165, alk phos 174, total bilirubin 2.6, lipase 17, lactic acid 1.1.  Urinalysis was negative for UTI.  Acute hepatitis panel was ordered and pending.  Single blood culture was collected and pending.  Urine culture was obtained and pending.  High-sensitivity troponin I 8.  SARS-CoV-2 PCR is negative.  Portable chest x-ray showed patchy right lung atelectasis, left basilar atelectasis, and left PICC line in place.  RUQ ultrasound showed  increased liver echogenicity suspicious for underlying parenchymal liver disease with likely superimposed hepatic steatosis.  No obvious gallbladder pathology evident although study limited due to patient positioning.  Right pleural effusion noted.  Patient was given 500 cc normal saline and started on maintenance fluids.  He received 1 amp D50 and 1 amp D 25.  He was started on empiric antibiotics with IV vancomycin, cefepime, and Flagyl.  The hospitalist service was consulted to admit for further evaluation and management.  Patient was transferred ED to the ED due to broken TPN pump which was not available at [facility].  Review of Systems:  All systems reviewed and are negative except as documented in history of present illness above.   Past Medical History:  Diagnosis Date  . Acute respiratory failure with hypoxia and hypercapnia ([place]) 05/16/2018  . HCAP (healthcare-associated pneumonia) 12/01/2017  . High anion gap metabolic acidosis 1/74/0814  . Hypotension   . Ileus ([place]) 09/23/2017  . Leukocytosis 05/05/2016  . MD (muscular dystrophy) ([place])   . Refusal of blood product    [name]  . Respiratory alkalosis 05/05/2016  . Sepsis ([place]) 05/05/2016  . Severe sepsis ([place]) 09/16/2019  . Sinus tachycardia 05/05/2016    Past Surgical History:  Procedure Laterality Date  . COLOSTOMY    . EYE SURGERY    . FLEXIBLE SIGMOIDOSCOPY N/A 01/10/2018   Procedure: FLEXIBLE SIGMOIDOSCOPY;  Surgeon: Laurence Spates, MD;  Location: [facility] ENDOSCOPY;  Service: Endoscopy;  Laterality: N/A;  . SMALL INTESTINE SURGERY      Social History:  reports that he has  never smoked. He has never used smokeless tobacco. He reports that he does not drink alcohol and does not use drugs.  Allergies  Allergen Reactions  . Fentanyl Other (See Comments)    Dizziness    No family history on file.   Prior to Admission medications   Medication Sig Start Date End Date Taking? Authorizing Provider   ferrous sulfate 220 (44 Fe) MG/5ML solution Take 220 mg by mouth 3 (three) times daily with meals.    Yes [provider]  ipratropium-albuterol (DUONEB) 0.5-2.5 (3) MG/3ML SOLN Take 3 mLs by nebulization every 6 (six) hours as needed. Patient taking differently: Take 3 mLs by nebulization every 6 (six) hours as needed (breathing / congestion).  09/21/19  Yes Harold Hedge, MD  magnesium oxide (MAG-OX) 400 MG tablet Take 400 mg by mouth 3 (three) times daily.   Yes [provider]  metoprolol tartrate (LOPRESSOR) 25 MG tablet Take 0.5 tablets (12.5 mg total) by mouth 3 (three) times daily. Patient taking differently: Take 25 mg by mouth 3 (three) times daily.  09/21/19 06/14/20 Yes Harold Hedge, MD  polyethylene glycol [facility] / Floria Raveling) packet Follow package instruction for daily use.  You need to have one soft bowel movement per day.  If he is not doing this call your primary care doctor.  You also need to be sure he takes in 1.5 liters of fluid per day.   He needs protein supplement daily, and needs to take in about 1300 calories per day. Patient taking differently: Take 17 g by mouth daily. Follow package instruction for daily use.  You need to have one soft bowel movement per day.  If he is not doing this call your primary care doctor.  You also need to be sure he takes in 1.5 liters of fluid per day.   He needs protein supplement daily, and needs to take in about 1300 calories per day. 01/15/18  Yes Earnstine Regal, PA-C  simethicone (GAS-X) 80 MG chewable tablet Chew 1 tablet (80 mg total) by mouth every 6 (six) hours as needed for flatulence (distention). 06/03/18  Yes Julianne Rice, MD  sterile water SOLN with amino acids 10 % SOLN 1.3 g/kg, dextrose 70 % SOLN 20 % Inject into the vein continuous.   Yes [provider]  acetaminophen (TYLENOL) 325 MG tablet Take 2 tablets (650 mg total) by mouth every 6 (six) hours as needed for mild pain, moderate pain, fever  or headache (or temp > 100). Patient not taking: Reported on 06/14/2020 01/15/18   Earnstine Regal, PA-C  benzonatate (TESSALON) 100 MG capsule Take 1 capsule (100 mg total) by mouth 3 (three) times daily as needed for cough. Patient not taking: Reported on 06/14/2020 02/08/20   Barb Merino, MD  feeding supplement, ENSURE ENLIVE, (ENSURE ENLIVE) LIQD You can use whatever supplement he likes.  He needs about 1300 calories per day. 1.5 liters of fluid and 50 grams of protein per day. You can buy this at the grocery or drug store.  You do not need a prescription. Patient not taking: Reported on 06/14/2020 01/15/18   Earnstine Regal, PA-C  Multiple Vitamin (MULTIVITAMIN WITH MINERALS) TABS tablet You can get him a general multivitamin for daily use at any drug store Patient not taking: Reported on 06/14/2020 01/15/18   Earnstine Regal, PA-C    Physical Exam: Vitals:   06/14/20 1700 06/14/20 1730 06/14/20 1916 06/14/20 1930  BP: (!) 106/59 (!) 115/56 127/74 121/68  Pulse: Marland Kitchen)  121 (!) 123 (!) 132 (!) 133  Resp: (!) 26 (!) 27 (!) 27 (!) 27  Temp:      TempSrc:      SpO2: 99% 100% 100% 100%  Weight:      Height:       Constitutional: Chronically ill-appearing young man resting in bed with head slightly elevated currently on noninvasive ventilation.  NAD, calm, comfortable Eyes: PERRL, lids and conjunctivae normal ENMT: Mucous membranes are dry. Posterior pharynx clear of any exudate or lesions. Neck: normal, supple, no masses. Respiratory: clear to auscultation anteriorly.  Normal respiratory effort. No accessory muscle use.  Cardiovascular: Tachycardic, no murmurs / rubs / gallops. No extremity edema. 2+ pedal pulses.  LUE PICC line in place. Abdomen: RUQ tenderness present, no masses palpated. No hepatosplenomegaly. Bowel sounds diminished.  Ostomy in place with scant output.  Well-healed abdominal surgical scars present. Musculoskeletal: no clubbing / cyanosis.  Contractures of all extremities.   Skin: no rashes, lesions, ulcers. No induration Neurologic: CN 2-12 grossly intact. Sensation intact, not moving extremities. Psychiatric: Normal judgment and insight. Alert and oriented x 3. Normal mood.   Labs on Admission: I have personally reviewed following labs and imaging studies  CBC: Recent Labs  Lab 06/14/20 1328  WBC 31.0*  NEUTROABS 25.2*  HGB 13.1  HCT 41.1  MCV 82.9  PLT 854   Basic Metabolic Panel: Recent Labs  Lab 06/14/20 1328  NA 136  K 3.4*  CL 99  CO2 18*  GLUCOSE 39*  BUN 27*  CREATININE <0.30*  CALCIUM 9.4   GFR: CrCl cannot be calculated (This lab value cannot be used to calculate CrCl because it is not a number: <0.30). Liver Function Tests: Recent Labs  Lab 06/14/20 1328  AST 183*  ALT 165*  ALKPHOS 174*  BILITOT 2.6*  PROT 8.3*  ALBUMIN 4.4   Recent Labs  Lab 06/14/20 1328  LIPASE 17   No results for input(s): AMMONIA in the last 168 hours. Coagulation Profile: Recent Labs  Lab 06/14/20 1604  INR 1.3*   Cardiac Enzymes: No results for input(s): CKTOTAL, CKMB, CKMBINDEX, TROPONINI in the last 168 hours. BNP (last 3 results) No results for input(s): PROBNP in the last 8760 hours. HbA1C: No results for input(s): HGBA1C in the last 72 hours. CBG: Recent Labs  Lab 06/14/20 1431 06/14/20 1501  GLUCAP 67* 116*   Lipid Profile: No results for input(s): CHOL, HDL, LDLCALC, TRIG, CHOLHDL, LDLDIRECT in the last 72 hours. Thyroid Function Tests: No results for input(s): TSH, T4TOTAL, FREET4, T3FREE, THYROIDAB in the last 72 hours. Anemia Panel: No results for input(s): VITAMINB12, FOLATE, FERRITIN, TIBC, IRON, RETICCTPCT in the last 72 hours. Urine analysis:    Component Value Date/Time   COLORURINE YELLOW 06/14/2020 [place] 06/14/2020 1604   LABSPEC >1.030 (H) 06/14/2020 1604   PHURINE 5.5 06/14/2020 [place] 06/14/2020 1604   HGBUR NEGATIVE 06/14/2020 1604   BILIRUBINUR NEGATIVE  06/14/2020 1604   KETONESUR 40 (A) 06/14/2020 1604   PROTEINUR NEGATIVE 06/14/2020 1604   NITRITE NEGATIVE 06/14/2020 1604   LEUKOCYTESUR NEGATIVE 06/14/2020 1604    Radiological Exams on Admission: US Abdomen Limited  Result Date: 06/14/2020 CLINICAL DATA:  Elevated liver enzymes EXAM: ULTRASOUND ABDOMEN LIMITED RIGHT UPPER QUADRANT COMPARISON:  None. FINDINGS: Gallbladder: Gallbladder assessment is somewhat limited due to positioning limits given underlying muscular dystrophy. No gallstones appreciable. No gallbladder wall thickening or pericholecystic fluid. No sonographic Murphy sign noted by sonographer. Common  bile duct: Diameter: 3 mm. No intrahepatic or extrahepatic biliary duct dilatation. Liver: No focal lesion identified. Liver echogenicity overall is increased with a somewhat coarsened echotexture. Portal vein is patent on color Doppler imaging with normal direction of blood flow towards the liver. Other: Right pleural effusion evident. IMPRESSION: 1. The liver echogenicity is increased and somewhat coarsened. Suspect underlying parenchymal liver disease with likely superimposed hepatic steatosis. No focal liver lesions are evident; it must be cautioned that the sensitivity of ultrasound for detection of focal liver lesions is somewhat limited in this circumstance. 2. No gallbladder pathology evident with limitations in gallbladder visualization given difficulties with patient positioning. If there remains concern for potential gallbladder pathology, it may be reasonable to consider nuclear medicine hepatobiliary imaging study to assess for cystic duct patency. 3.  Right pleural effusion. Electronically Signed   By: Lowella Grip III M.D.   On: 06/14/2020 15:13   DG Chest Port 1 View  Result Date: 06/14/2020 CLINICAL DATA:  Cough EXAM: PORTABLE CHEST 1 VIEW COMPARISON:  02/08/2020 FINDINGS: Patchy density in the right lung. Left basilar atelectasis. No pleural effusion. No pneumothorax.  Stable cardiomediastinal contours. Left PICC line tip overlies the superior right atrium. IMPRESSION: Patchy right lung atelectasis/consolidation. Left basilar atelectasis. Electronically Signed   By: Macy Mis M.D.   On: 06/14/2020 13:30    EKG: Independently reviewed. Sinus tachycardia, rate 120, nonspecific ST changes seen in more pronounced when compared to prior.  Rate is faster when compared to prior.  Assessment/Plan Principal Problem:   Sepsis ([place]) Active Problems:   Muscular dystrophy ([place])   Hypoglycemia without diagnosis of diabetes mellitus  Peter Hayes is a 24 y.o. male with medical history significant for advanced muscular dystrophy with debility, severe protein calorie malnutrition on chronic TPN, chronic respiratory failure on noninvasive positive pressure ventilation who is admitted with sepsis of unknown etiology.  Sepsis of unknown source: Patient presented with tachycardia with pulse 137, tachypnea with RR up to 31, and leukocytosis with WBC of 31.  He is at high risk for infection with PICC line in place left upper extremity as well as receiving chronic TPN.  SARS-CoV-2 PCR is negative.  Urinalysis is negative for UTI.  Possible intra-abdominal source with LFT abnormalities.  Single blood culture was obtained and pending.  He has been started on broad-spectrum IV antibiotics. -Continue broad-spectrum antibiotics with IV vancomycin, ceftriaxone, Flagyl and narrow as able -Follow blood and urine culture -Will not restart TPN pending blood culture at this time -Continue IV fluid resuscitation overnight  Abnormal LFTs: AST 183, ALT 165, alk phos 174, and T bili 2.6 on admission.  Patient also has RUQ pain and tenderness on exam.  RUQ ultrasound was indeterminate. -Continue antibiotics as above -Obtain HIDA scan -Follow acute hepatitis panel and trend liver function  Hypoglycemia: In setting of suspected sepsis and inability to receive TPN over the last 1-2  days. -Continue IV fluids with D5-0.9 NS overnight -Monitor CBGs every 6 hours  Hypokalemia: Mild, will replete and recheck labs in a.m.  Advanced muscular dystrophy with severe protein calorie malnutrition on chronic TPN with ostomy in place: TPN pump recently broken.  Holding TPN for now as above, will need dietary consult when restarting TPN.  Continue ostomy care.  Chronic respiratory failure on noninvasive positive pressure ventilation: Chronic and stable.  Continue home noninvasive positive pressure ventilation.  Chronic tachycardia: Takes metoprolol 25 mg 3 times daily at home.  Will continue.   DVT prophylaxis: Lovenox Code Status:  Full code, confirmed with patient and father Family Communication: Discussed with patient's father at bedside Disposition Plan: From home and likely discharge to home pending further sepsis work-up and management and clinical improvement Consults called: None Admission status:  Status is: Inpatient  Remains inpatient appropriate because:IV treatments appropriate due to intensity of illness or inability to take PO and Inpatient level of care appropriate due to severity of illness   Dispo: The patient is from: Home              Anticipated d/c is to: Home              Anticipated d/c date is: 3 days              Patient currently is not medically stable to d/c.  Zada Finders MD [facility]  If 7PM-7AM, please contact night-coverage [email or web address]  06/14/2020, 9:22 PM

## 2020-06-14 NOTE — Progress Notes (Signed)
Pharmacy Antibiotic Note  Peter Hayes is a 24 y.o. male admitted on 06/14/2020 with sepsis.  Pharmacy has been consulted for vancomycin and cefepime dosing.  Plan: Vancomycin 500 mg IV every 24 hours.  Goal - AUC ~ 525 mg*hr/mL  Cefepime 2 Gm IV every 12 hours  Height: $Remove'5\' 2"'ARUZchr$  (157.5 cm) Weight: 34 kg (74 lb 15.3 oz) IBW/kg (Calculated) : 54.6  Temp (24hrs), Avg:98.7 F (37.1 C), Min:98.7 F (37.1 C), Max:98.7 F (37.1 C)  Recent Labs  Lab 06/14/20 1328 06/14/20 1508  WBC 31.0*  --   CREATININE <0.30*  --   LATICACIDVEN  --  1.1    CrCl cannot be calculated (This lab value cannot be used to calculate CrCl because it is not a number: <0.30).    Allergies  Allergen Reactions  . Fentanyl Other (See Comments)    Dizziness    Antimicrobials this admission: Metronidazole 500 mg x 1 Cefepime 2 gm IV Vancomycin 500 mg IV   Microbiology results: pending  Thank you for allowing pharmacy to be a part of this patient's care.  Peter Hayes 06/14/2020 4:14 PM

## 2020-06-14 NOTE — ED Provider Notes (Signed)
Medical screening examination/treatment/procedure(s) were conducted as a shared visit with non-physician practitioner(s) and myself.  I personally evaluated the patient during the encounter.  EKG Interpretation  Date/Time:  Tuesday June 14 2020 16:08:58 EDT Ventricular Rate:  120 PR Interval:    QRS Duration: 104 QT Interval:  303 QTC Calculation: 429 R Axis:   91 Text Interpretation: Sinus tachycardia Borderline right axis deviation Borderline T abnormalities, inferior leads ST elevation, consider anterior injury New ST changes otherwise No significant change since last tracing Confirmed by Fredia Sorrow [phone]) on 06/14/2020 4:13:31 PM   Results for orders placed or performed during the hospital encounter of 06/14/20  SARS Coronavirus 2 by RT PCR (hospital order, performed in [place] hospital lab) Nasopharyngeal Nasopharyngeal Swab   Specimen: Nasopharyngeal Swab  Result Value Ref Range   SARS Coronavirus 2 NEGATIVE NEGATIVE  Comprehensive metabolic panel  Result Value Ref Range   Sodium 136 135 - 145 mmol/L   Potassium 3.4 (L) 3.5 - 5.1 mmol/L   Chloride 99 98 - 111 mmol/L   CO2 18 (L) 22 - 32 mmol/L   Glucose, Bld 39 (LL) 70 - 99 mg/dL   BUN 27 (H) 6 - 20 mg/dL   Creatinine, Ser <0.30 (L) 0.61 - 1.24 mg/dL   Calcium 9.4 8.9 - 10.3 mg/dL   Total Protein 8.3 (H) 6.5 - 8.1 g/dL   Albumin 4.4 3.5 - 5.0 g/dL   AST 183 (H) 15 - 41 U/L   ALT 165 (H) 0 - 44 U/L   Alkaline Phosphatase 174 (H) 38 - 126 U/L   Total Bilirubin 2.6 (H) 0.3 - 1.2 mg/dL   GFR calc non Af Amer NOT CALCULATED >60 mL/min   GFR calc Af Amer NOT CALCULATED >60 mL/min   Anion gap 19 (H) 5 - 15  CBC with Differential  Result Value Ref Range   WBC 31.0 (H) 4.0 - 10.5 K/uL   RBC 4.96 4.22 - 5.81 MIL/uL   Hemoglobin 13.1 13.0 - 17.0 g/dL   HCT 41.1 39 - 52 %   MCV 82.9 80.0 - 100.0 fL   MCH 26.4 26.0 - 34.0 pg   MCHC 31.9 30.0 - 36.0 g/dL   RDW 15.2 11.5 - 15.5 %   Platelets 237 150 - 400 K/uL    nRBC 0.0 0.0 - 0.2 %   Neutrophils Relative % 81 %   Neutro Abs 25.2 (H) 1.7 - 7.7 K/uL   Lymphocytes Relative 4 %   Lymphs Abs 1.3 0.7 - 4.0 K/uL   Monocytes Relative 12 %   Monocytes Absolute 3.7 (H) 0 - 1 K/uL   Eosinophils Relative 0 %   Eosinophils Absolute 0.0 0 - 0 K/uL   Basophils Relative 0 %   Basophils Absolute 0.1 0 - 0 K/uL   Immature Granulocytes 3 %   Abs Immature Granulocytes 0.80 (H) 0.00 - 0.07 K/uL   Ovalocytes PRESENT    Giant PLTs PRESENT   Lipase, blood  Result Value Ref Range   Lipase 17 11 - 51 U/L  Lactic acid, plasma  Result Value Ref Range   Lactic Acid, Venous 1.1 0.5 - 1.9 mmol/L  Protime-INR  Result Value Ref Range   Prothrombin Time 15.6 (H) 11.4 - 15.2 seconds   INR 1.3 (H) 0.8 - 1.2  APTT  Result Value Ref Range   aPTT 35 24 - 36 seconds  CBG monitoring, ED  Result Value Ref Range   Glucose-Capillary 67 (L) 70 - 99  mg/dL  CBG monitoring, ED  Result Value Ref Range   Glucose-Capillary 116 (H) 70 - 99 mg/dL  Troponin I (High Sensitivity)  Result Value Ref Range   Troponin I (High Sensitivity) 8 <18 ng/L   US Abdomen Limited  Result Date: 06/14/2020 CLINICAL DATA:  Elevated liver enzymes EXAM: ULTRASOUND ABDOMEN LIMITED RIGHT UPPER QUADRANT COMPARISON:  None. FINDINGS: Gallbladder: Gallbladder assessment is somewhat limited due to positioning limits given underlying muscular dystrophy. No gallstones appreciable. No gallbladder wall thickening or pericholecystic fluid. No sonographic Murphy sign noted by sonographer. Common bile duct: Diameter: 3 mm. No intrahepatic or extrahepatic biliary duct dilatation. Liver: No focal lesion identified. Liver echogenicity overall is increased with a somewhat coarsened echotexture. Portal vein is patent on color Doppler imaging with normal direction of blood flow towards the liver. Other: Right pleural effusion evident. IMPRESSION: 1. The liver echogenicity is increased and somewhat coarsened. Suspect underlying  parenchymal liver disease with likely superimposed hepatic steatosis. No focal liver lesions are evident; it must be cautioned that the sensitivity of ultrasound for detection of focal liver lesions is somewhat limited in this circumstance. 2. No gallbladder pathology evident with limitations in gallbladder visualization given difficulties with patient positioning. If there remains concern for potential gallbladder pathology, it may be reasonable to consider nuclear medicine hepatobiliary imaging study to assess for cystic duct patency. 3.  Right pleural effusion. Electronically Signed   By: Lowella Grip III M.D.   On: 06/14/2020 15:13   DG Chest Port 1 View  Result Date: 06/14/2020 CLINICAL DATA:  Cough EXAM: PORTABLE CHEST 1 VIEW COMPARISON:  02/08/2020 FINDINGS: Patchy density in the right lung. Left basilar atelectasis. No pleural effusion. No pneumothorax. Stable cardiomediastinal contours. Left PICC line tip overlies the superior right atrium. IMPRESSION: Patchy right lung atelectasis/consolidation. Left basilar atelectasis. Electronically Signed   By: Macy Mis M.D.   On: 06/14/2020 13:30   Patient with multiple medical issues.  Main thing being cerebral palsy.  Patient on 24 7 CPAP BiPAP type oxygen.  According to father was fine yesterday.  Today stated he was nauseated body aches.  Patient not vaccinated for COVID-19.  Patient tachycardic on arrival not febrile.  Not hypotensive but respiratory rate was up as well.  Oxygen sats are in the upper 90s.  On his normal amount of CPAP BiPAP.  Sepsis protocol was initiated he was not a candidate for the 30 cc/kg fluid challenge.  Started on broad-spectrum antibiotics.  Chest x-ray without any acute findings.  Covid testing was negative.  Surprisingly lactic acid was not elevated.  Blood cultures have been done.  Patient was hypoglycemic on arrival.  Given an amp of D50.  We will start him on D5 normal saline.  Patient also given a bit of a  fluid challenge.  EKG consistent with sinus tachycardia.  Patient's blood sugars will need to be watched carefully.  They may drop again.  Patient normally on TPN but his pump broke yesterday.  So he did not receive TPN this is probably why the blood sugars are low.  Although sepsis can do that as well.  Patient will be admitted to the hospitalist service at [facility].  But due to the fact that he needs a TPN pump that we do not have here.  And there is not 1 to be brought in from home patient will be in ED to ED transfer.  Awaiting admission at [facility].  Critical care time CRITICAL CARE Performed by: Nicki Reaper  Masaji Billups Total critical care time: 45 minutes Critical care time was exclusive of separately billable procedures and treating other patients. Critical care was necessary to treat or prevent imminent or life-threatening deterioration. Critical care was time spent personally by me on the following activities: development of treatment plan with patient and/or surrogate as well as nursing, discussions with consultants, evaluation of patient's response to treatment, examination of patient, obtaining history from patient or surrogate, ordering and performing treatments and interventions, ordering and review of laboratory studies, ordering and review of radiographic studies, pulse oximetry and re-evaluation of patient's condition.    Fredia Sorrow, MD 06/14/20 [phone]

## 2020-06-15 ENCOUNTER — Inpatient Hospital Stay (HOSPITAL_COMMUNITY): Payer: Managed Care, Other (non HMO)

## 2020-06-15 ENCOUNTER — Encounter (HOSPITAL_COMMUNITY): Payer: Self-pay | Admitting: Internal Medicine

## 2020-06-15 DIAGNOSIS — E872 Acidosis: Secondary | ICD-10-CM

## 2020-06-15 DIAGNOSIS — E43 Unspecified severe protein-calorie malnutrition: Secondary | ICD-10-CM

## 2020-06-15 DIAGNOSIS — E162 Hypoglycemia, unspecified: Secondary | ICD-10-CM

## 2020-06-15 DIAGNOSIS — R6521 Severe sepsis with septic shock: Secondary | ICD-10-CM

## 2020-06-15 DIAGNOSIS — J9611 Chronic respiratory failure with hypoxia: Secondary | ICD-10-CM

## 2020-06-15 DIAGNOSIS — G7100 Muscular dystrophy, unspecified: Secondary | ICD-10-CM

## 2020-06-15 LAB — GLUCOSE, CAPILLARY
Glucose-Capillary: 84 mg/dL (ref 70–99)
Glucose-Capillary: 101 mg/dL — ABNORMAL HIGH (ref 70–99)
Glucose-Capillary: 81 mg/dL (ref 70–99)

## 2020-06-15 LAB — COMPREHENSIVE METABOLIC PANEL
ALT: 178 U/L — ABNORMAL HIGH (ref 0–44)
AST: 115 U/L — ABNORMAL HIGH (ref 15–41)
Albumin: 3.7 g/dL (ref 3.5–5.0)
Alkaline Phosphatase: 130 U/L — ABNORMAL HIGH (ref 38–126)
Anion gap: 15 (ref 5–15)
BUN: 19 mg/dL (ref 6–20)
CO2: 19 mmol/L — ABNORMAL LOW (ref 22–32)
Calcium: 8.5 mg/dL — ABNORMAL LOW (ref 8.9–10.3)
Chloride: 111 mmol/L (ref 98–111)
Creatinine, Ser: <0.3 mg/dL — ABNORMAL LOW (ref 0.61–1.24)
Glucose, Bld: 127 mg/dL — ABNORMAL HIGH (ref 70–99)
Potassium: 2.2 mmol/L — CL (ref 3.5–5.1)
Sodium: 145 mmol/L (ref 135–145)
Total Bilirubin: 1.5 mg/dL — ABNORMAL HIGH (ref 0.3–1.2)
Total Protein: 6.6 g/dL (ref 6.5–8.1)

## 2020-06-15 LAB — PROCALCITONIN: Procalcitonin: 4.07 ng/mL

## 2020-06-15 LAB — BASIC METABOLIC PANEL
Anion gap: 12 (ref 5–15)
BUN: 9 mg/dL (ref 6–20)
CO2: 19 mmol/L — ABNORMAL LOW (ref 22–32)
Calcium: 8.3 mg/dL — ABNORMAL LOW (ref 8.9–10.3)
Chloride: 111 mmol/L (ref 98–111)
Creatinine, Ser: <0.3 mg/dL — ABNORMAL LOW (ref 0.61–1.24)
Glucose, Bld: 95 mg/dL (ref 70–99)
Potassium: 3.3 mmol/L — ABNORMAL LOW (ref 3.5–5.1)
Sodium: 142 mmol/L (ref 135–145)

## 2020-06-15 LAB — CBC
HCT: 34.8 % — ABNORMAL LOW (ref 39.0–52.0)
Hemoglobin: 10.8 g/dL — ABNORMAL LOW (ref 13.0–17.0)
MCH: 26.7 pg (ref 26.0–34.0)
MCHC: 31 g/dL (ref 30.0–36.0)
MCV: 86.1 fL (ref 80.0–100.0)
Platelets: 180 10*3/uL (ref 150–400)
RBC: 4.04 MIL/uL — ABNORMAL LOW (ref 4.22–5.81)
RDW: 15.2 % (ref 11.5–15.5)
WBC: 19.6 10*3/uL — ABNORMAL HIGH (ref 4.0–10.5)
nRBC: 0 % (ref 0.0–0.2)

## 2020-06-15 LAB — MRSA PCR SCREENING: MRSA by PCR: NEGATIVE

## 2020-06-15 LAB — PATHOLOGIST SMEAR REVIEW

## 2020-06-15 LAB — PHOSPHORUS
Phosphorus: 2.6 mg/dL (ref 2.5–4.6)
Phosphorus: 1.6 mg/dL — ABNORMAL LOW (ref 2.5–4.6)

## 2020-06-15 LAB — URINE CULTURE

## 2020-06-15 LAB — MAGNESIUM
Magnesium: 1.5 mg/dL — ABNORMAL LOW (ref 1.7–2.4)
Magnesium: 1.8 mg/dL (ref 1.7–2.4)

## 2020-06-15 MED ORDER — SODIUM CHLORIDE 0.9 % IV BOLUS
500.0000 mL | Freq: Once | INTRAVENOUS | Status: AC
  Administered 2020-06-15: 500 mL via INTRAVENOUS

## 2020-06-15 MED ORDER — NOREPINEPHRINE 4 MG/250ML-% IV SOLN
0.0000 ug/min | INTRAVENOUS | Status: DC
  Administered 2020-06-15: 2 ug/min via INTRAVENOUS
  Administered 2020-06-15: 7 ug/min via INTRAVENOUS
  Administered 2020-06-16: 4 ug/min via INTRAVENOUS
  Filled 2020-06-15 (×3): qty 250

## 2020-06-15 MED ORDER — POTASSIUM CHLORIDE 10 MEQ/100ML IV SOLN
10.0000 meq | INTRAVENOUS | Status: AC
  Administered 2020-06-15 (×4): 10 meq via INTRAVENOUS
  Filled 2020-06-15 (×3): qty 100

## 2020-06-15 MED ORDER — SODIUM CHLORIDE 0.9 % IV SOLN
100.0000 mg | INTRAVENOUS | Status: DC
  Administered 2020-06-16: 100 mg via INTRAVENOUS
  Filled 2020-06-15: qty 100

## 2020-06-15 MED ORDER — VANCOMYCIN HCL 500 MG IV SOLR
500.0000 mg | Freq: Two times a day (BID) | INTRAVENOUS | Status: DC
  Filled 2020-06-15: qty 500

## 2020-06-15 MED ORDER — SODIUM CHLORIDE 0.9 % IV SOLN
200.0000 mg | Freq: Once | INTRAVENOUS | Status: AC
  Administered 2020-06-15: 200 mg via INTRAVENOUS
  Filled 2020-06-15: qty 200

## 2020-06-15 MED ORDER — ALBUMIN HUMAN 5 % IV SOLN
12.5000 g | Freq: Once | INTRAVENOUS | Status: AC
  Administered 2020-06-15: 12.5 g via INTRAVENOUS
  Filled 2020-06-15: qty 250

## 2020-06-15 MED ORDER — VANCOMYCIN HCL 500 MG/100ML IV SOLN
500.0000 mg | Freq: Two times a day (BID) | INTRAVENOUS | Status: DC
  Administered 2020-06-15 – 2020-06-16 (×2): 500 mg via INTRAVENOUS
  Filled 2020-06-15 (×2): qty 100

## 2020-06-15 MED ORDER — IOHEXOL 300 MG/ML  SOLN
100.0000 mL | Freq: Once | INTRAMUSCULAR | Status: AC | PRN
  Administered 2020-06-15: 100 mL via INTRAVENOUS

## 2020-06-15 MED ORDER — LACTATED RINGERS IV BOLUS
1000.0000 mL | Freq: Once | INTRAVENOUS | Status: AC
  Administered 2020-06-15: 1000 mL via INTRAVENOUS

## 2020-06-15 MED ORDER — VANCOMYCIN HCL 500 MG IV SOLR: 500.0000 mg | Freq: Two times a day (BID) | INTRAVENOUS | Status: DC

## 2020-06-15 MED ORDER — METRONIDAZOLE IN NACL 5-0.79 MG/ML-% IV SOLN
500.0000 mg | Freq: Three times a day (TID) | INTRAVENOUS | Status: DC
  Administered 2020-06-15 – 2020-06-16 (×3): 500 mg via INTRAVENOUS
  Filled 2020-06-15 (×3): qty 100

## 2020-06-15 MED ORDER — MAGNESIUM SULFATE 2 GM/50ML IV SOLN
2.0000 g | Freq: Once | INTRAVENOUS | Status: AC
  Administered 2020-06-15: 2 g via INTRAVENOUS
  Filled 2020-06-15: qty 50

## 2020-06-15 MED ORDER — POTASSIUM CHLORIDE 20 MEQ PO PACK
20.0000 meq | PACK | Freq: Three times a day (TID) | ORAL | Status: AC
  Administered 2020-06-15 (×3): 20 meq via ORAL
  Filled 2020-06-15 (×3): qty 1

## 2020-06-15 MED ORDER — POTASSIUM PHOSPHATES 15 MMOLE/5ML IV SOLN
15.0000 mmol | Freq: Once | INTRAVENOUS | Status: AC
  Administered 2020-06-15: 15 mmol via INTRAVENOUS
  Filled 2020-06-15: qty 5

## 2020-06-15 NOTE — Plan of Care (Signed)

## 2020-06-15 NOTE — Consult Note (Signed)
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  History of present illness   See H+P dated 06/14/20.  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 Less nauseated. Mild epigastric tenderness. Bps soft on NE. Received 500 cc bolus x 2. 1L LR ordered this AM by me.   Objective   Blood pressure (!) 113/59, pulse 95, temperature 98.3 F (36.8 C), temperature source Axillary, resp. rate (!) 41, height 5\' 2"  (1.575 m), weight 34 kg, SpO2 98 %.    Vent Mode: Other (Comment) Set Rate:  [5 bmp] 5 bmp Vt Set:  [400 mL] 400 mL Delta P (Amplitude):  [18] 18   Intake/Output Summary (Last 24 hours) at 06/15/2020 1026 Last data filed at 06/15/2020 0400 Gross per 24 hour  Intake 2107.97 ml  Output 200 ml  Net 1907.97 ml   Filed Weights   06/14/20 1158  Weight: 34 kg    Examination: General: chronically ill appearing, in NAD HENT: EOMI, no icterus Lungs: CTAB, NWOB on BiPAP Cardiovascular: RRR, no murmur Abdomen: flat, mild TTP in epigastric region Extremities: Contracted, no edema Neuro: Speaks clearly but softly, weak in all extremities   Resolved Hospital  Problem list   n/a  Assessment & Plan:  Septic Shock from presumed biliary source: RUQ 08/14/20 shows gallbladder (no thickening, no murphy sign) ok but limited to to patient positioning. LFTs elevated, ascending cholangitis possible. No ductal dilation seen. Has chronic PICC for TPN, line infection possible. CXR with bilateral atelectasis not surprising given NM weakness, UA clean. Tachypneic, WBC 31k on arrival. WBC trending down. --CT abd/pelvis --CTX/flagyl/vanc/anidulafungin --Cultures pending --MAP goal > 60, NE ordered  Chronic hypoxemic respiratory failure due to NM weakness: Possibly worsened in setting of sepsis, metabolic acidosis. Continue home NIPPV, ok to trial off but says has been using it more around the clock the last few weeks.   Anion gap metabolic acidosis: LA WNL. Suspect starvation ketosis. --D5/NS gtt --Resume TPN once able  Severe protein calorie malnutrition: Due to NM weakness, TPN dependent. Report that TPN pump not working the night prior to arrival. --Hold TPN for now, resume when able - infection under better control  Hypokalemia: Refeeding? Maybe TPN pump is longer standing issue --Replacement with IV (can not take PO) --PM BMP --D5/NS infusion  Best practice:  Diet: NPO, Resume TPN when/if blood cultures clear Pain/Anxiety/Delirium protocol (if indicated): avoid sedating meds, delirium precautions VAP protocol (if indicated): n/a DVT prophylaxis: lovenox SQ GI prophylaxis: n/a Glucose control: n/a Mobility: Bed rest Code Status: full Family Communication: with father at bedside Disposition: ICU, PCCM primary  Labs   CBC: Recent Labs  Lab 06/14/20 1328 06/15/20 0536  WBC 31.0* 19.6*  NEUTROABS 25.2*  --   HGB 13.1 10.8*  HCT 41.1 34.8*  MCV 82.9 86.1  PLT 237 384    Basic Metabolic Panel: Recent Labs  Lab 06/14/20 1328 06/15/20 0536  NA 136 145  K 3.4* 2.2*  CL 99 111  CO2 18* 19*  GLUCOSE 39* 127*  BUN 27* 19  CREATININE <0.30*  <0.30*  CALCIUM 9.4 8.5*  MG  --  1.5*  PHOS  --  2.6   GFR: CrCl cannot be calculated (This lab value cannot be used to calculate CrCl because it is not a number: <0.30). Recent Labs  Lab 06/14/20 1328 06/14/20 1508 06/15/20 0536  PROCALCITON  --   --  4.07  WBC 31.0*  --  19.6*  LATICACIDVEN  --  1.1  --     Liver Function Tests: Recent Labs  Lab 06/14/20 1328 06/15/20 0536  AST 183* 115*  ALT 165* 178*  ALKPHOS 174* 130*  BILITOT 2.6* 1.5*  PROT 8.3* 6.6  ALBUMIN 4.4 3.7   Recent Labs  Lab 06/14/20 1328  LIPASE 17   No results for input(s): AMMONIA in the last 168 hours.  ABG    Component Value Date/Time   PHART 7.419 09/02/2018 0540   PCO2ART 58.7 (H) 09/02/2018 0540   PO2ART 150 (H) 09/02/2018 0540   HCO3 38.5 (H) 09/15/2019 1903   TCO2 41 (H) 09/15/2019 1903   ACIDBASEDEF 16.0 (H) 05/05/2016 0316   O2SAT 37.0 09/15/2019 1903     Coagulation Profile: Recent Labs  Lab 06/14/20 1604  INR 1.3*    Cardiac Enzymes: No results for input(s): CKTOTAL, CKMB, CKMBINDEX, TROPONINI in the last 168 hours.  HbA1C: No results found for: HGBA1C  CBG: Recent Labs  Lab 06/14/20 1431 06/14/20 1501 06/15/20 0308  GLUCAP 67* 116* 84    Review of Systems:   Unobtainable due to patient factors  Past Medical History  He,  has a past medical history of Acute respiratory failure with hypoxia and hypercapnia ([place]) (05/16/2018), HCAP (healthcare-associated pneumonia) (12/01/2017), High anion gap metabolic acidosis (5/36/4680), Hypotension, Ileus ([place]) (09/23/2017), Leukocytosis (05/05/2016), MD (muscular dystrophy) ([place]), Refusal of blood product, Respiratory alkalosis (05/05/2016), Sepsis ([place]) (05/05/2016), Severe sepsis ([place]) (09/16/2019), and Sinus tachycardia (05/05/2016).   Surgical History    Past Surgical History:  Procedure Laterality Date  . COLOSTOMY    . EYE SURGERY    . FLEXIBLE SIGMOIDOSCOPY N/A 01/10/2018   Procedure: FLEXIBLE SIGMOIDOSCOPY;  Surgeon:  Laurence Spates, MD;  Location: [facility];  Service: Endoscopy;  Laterality: N/A;  . SMALL INTESTINE SURGERY       Social History   reports that he has never smoked. He has never used smokeless tobacco. He reports that he does not drink alcohol and does not use drugs.   Family History   His family history is not on file.   Allergies Allergies  Allergen Reactions  . Fentanyl Other (See Comments)    Dizziness     Home Medications  Prior to Admission medications   Medication Sig Start Date End Date Taking? Authorizing Provider  ferrous sulfate 220 (44 Fe) MG/5ML solution Take 220 mg by mouth 3 (three) times daily with meals.    Yes [provider]  ipratropium-albuterol (DUONEB) 0.5-2.5 (3) MG/3ML SOLN Take 3 mLs by nebulization every 6 (six) hours as needed. Patient taking differently: Take 3 mLs by nebulization every 6 (six) hours as needed (breathing / congestion).  09/21/19  Yes Harold Hedge,  MD  magnesium oxide (MAG-OX) 400 MG tablet Take 400 mg by mouth 3 (three) times daily.   Yes [provider]  metoprolol tartrate (LOPRESSOR) 25 MG tablet Take 0.5 tablets (12.5 mg total) by mouth 3 (three) times daily. Patient taking differently: Take 25 mg by mouth 3 (three) times daily.  09/21/19 06/14/20 Yes Harold Hedge, MD  polyethylene glycol [facility] / Floria Raveling) packet Follow package instruction for daily use.  You need to have one soft bowel movement per day.  If he is not doing this call your primary care doctor.  You also need to be sure he takes in 1.5 liters of fluid per day.   He needs protein supplement daily, and needs to take in about 1300 calories per day. Patient taking differently: Take 17 g by mouth daily. Follow package instruction for daily use.  You need to have one soft bowel movement per day.  If he is not doing this call your primary care doctor.  You also need to be sure he takes in 1.5 liters of fluid per day.   He needs protein supplement daily,  and needs to take in about 1300 calories per day. 01/15/18  Yes Earnstine Regal, PA-C  simethicone (GAS-X) 80 MG chewable tablet Chew 1 tablet (80 mg total) by mouth every 6 (six) hours as needed for flatulence (distention). 06/03/18  Yes Julianne Rice, MD  sterile water SOLN with amino acids 10 % SOLN 1.3 g/kg, dextrose 70 % SOLN 20 % Inject 1,000 mLs into the vein continuous. TPN 1000 ml w/ lipids  Directions: Infuse TPN 1060ml IV through PICC line via CADD SOLIS infusion pump daily over 18 hours with 1 hr ramp up and 1 hr ramp down. Add MVI 10 ml as directed to each bag prior to infusion. MIX WELL VTBI: 1000 ML Bag Volume  1050 ml     Dosing Weight for the patient 72 lbs   Yes [provider]  acetaminophen (TYLENOL) 325 MG tablet Take 2 tablets (650 mg total) by mouth every 6 (six) hours as needed for mild pain, moderate pain, fever or headache (or temp > 100). Patient not taking: Reported on 06/14/2020 01/15/18   Earnstine Regal, PA-C  benzonatate (TESSALON) 100 MG capsule Take 1 capsule (100 mg total) by mouth 3 (three) times daily as needed for cough. Patient not taking: Reported on 06/14/2020 02/08/20   Barb Merino, MD  feeding supplement, ENSURE ENLIVE, (ENSURE ENLIVE) LIQD You can use whatever supplement he likes.  He needs about 1300 calories per day. 1.5 liters of fluid and 50 grams of protein per day. You can buy this at the grocery or drug store.  You do not need a prescription. Patient not taking: Reported on 06/14/2020 01/15/18   Earnstine Regal, PA-C  Multiple Vitamin (MULTIVITAMIN WITH MINERALS) TABS tablet You can get him a general multivitamin for daily use at any drug store Patient not taking: Reported on 06/14/2020 01/15/18   Earnstine Regal, PA-C     Critical care time:     CRITICAL CARE Performed by: Lanier Clam   Total critical care time: 44 minutes  Critical care time was exclusive of separately billable procedures and treating other  patients.  Critical care was necessary to treat or prevent imminent or life-threatening deterioration.  Critical care was time spent personally by me on the following activities: development of treatment plan with patient and/or surrogate as well as nursing, discussions with consultants, evaluation of patient's response to treatment, examination  of patient, obtaining history from patient or surrogate, ordering and performing treatments and interventions, ordering and review of laboratory studies, ordering and review of radiographic studies, pulse oximetry and re-evaluation of patient's condition.

## 2020-06-15 NOTE — Progress Notes (Signed)
Per Nelva Bush, okay for family to swap out throughout the day; dad is primary caregiver

## 2020-06-15 NOTE — Progress Notes (Signed)
Pharmacy Antibiotic Note  Peter Hayes is a 24 y.o. male with a h/o MD who is TPN dependent on NIPPV at night and recently during the day admitted on 06/14/2020 with sepsis of unclear source.  Pharmacy has been consulted for vancomycin dosing.  Plan: Increase vancomycin dosing to 500 mg iv q 12 hours  Ceftriaxone 2 g iv q 24h per MD  Flagyl 500 mg iv q 8 h per MD  Eraxis 200 mg x 1 then 100 mg iv q 24h per MD  F/U renal function, culture results, clinical course  Height: $Remove'5\' 2"'IBdhrLJ$  (157.5 cm) Weight: 34 kg (74 lb 15.3 oz) IBW/kg (Calculated) : 54.6  Temp (24hrs), Avg:98.8 F (37.1 C), Min:98.3 F (36.8 C), Max:99.4 F (37.4 C)  Recent Labs  Lab 06/14/20 1328 06/14/20 1508 06/15/20 0536  WBC 31.0*  --  19.6*  CREATININE <0.30*  --  <0.30*  LATICACIDVEN  --  1.1  --     CrCl cannot be calculated (This lab value cannot be used to calculate CrCl because it is not a number: <0.30).    Allergies  Allergen Reactions  . Fentanyl Other (See Comments)    Dizziness    Antimicrobials this admission: 9/7 cefepime x 1 9/7 CTX >>  9/7 Flagyl >> 9/7 vancomycin >>   Dose adjustments this admission:   Microbiology results: 9/7 BCx: ngtd 9/7 UCx: sent  9/7 COVID: neg 9/7 MRSA PCR: negative  Thank you for allowing pharmacy to be a part of this patient's care.  Ulice Dash D 06/15/2020 12:18 PM

## 2020-06-15 NOTE — Progress Notes (Signed)
Initial Nutrition Assessment  RD working remotely.  DOCUMENTATION CODES:   Underweight  INTERVENTION:  - will monitor for plan concerning nutrition support. - question if surgically placed G-tube would be feasible--dependent on anatomy and positioning of colostomy.   Monitor magnesium, potassium, and phosphorus daily for at least 3 days, MD to replete as needed, as pt is at risk for refeeding syndrome given current hypokalemia and mild hypomagnesemia; report in the ED that TPN pump broke an unknown amount of time PTA.   NUTRITION DIAGNOSIS:   Increased nutrient needs related to acute illness as evidenced by estimated needs.  GOAL:   Patient will meet greater than or equal to 90% of their needs  MONITOR:   PO intake, Supplement acceptance, Labs, Weight trends, I & O's, Other (Comment) (plans concerning nutrition support)  REASON FOR ASSESSMENT:   Malnutrition Screening Tool  ASSESSMENT:   24 y.o. male with medical history of advanced muscular dystrophy with debility, severe protein calorie malnutrition on chronic TPN, and chronic respiratory failure on non-invasive positive pressure ventilation. He presented to [facility] ED for evaluation of nausea, abdominal pain, body aches, chills, and diaphoresis. In the ED, his father reported to staff that patient has had decreased ostomy output. He has LUE PICC for TPN, but father reported TPN pump recently broke. Father also reported that patient has a chronic cough productive of clear sputum.  Patient is well known to this RD from previous hospitalizations. Most recently was in December 2020. 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 He had difficulty chewing and swallowing hard/very crunchy items.   Patient has contractures and needed assistance with all ADLs as of  assessment in December.   It appears from [place] that PICC was placed by IR at [facility] on 02/09/20. Suspect TPN started around this same date, although specific date for TPN start unable to be located at this time.  Weight yesterday was 75 lb and weight has been fairly stable over the past 30 months; some slight fluctuations.   Per notes: - sepsis - restart of TPN is dependent on blood cultures - plan for HIDA scan and acute hepatitis panel - chronic respiratory failure on non-invasive positive pressure ventilation (BiPAP) currently and on non-invasive positive pressure ventilation at home    Labs reviewed; CBG: 84 mg/dl, K: 2.2 mmol/l, creatinine: 0.3 mg/dl, Ca: 8.5 mg/dl, Mg: 1.5 mg/dl, Alk Phos elevated, LFTs elevated.  Medications reviewed; 12.5 g albumin x1 dose 9/8, 400 mg mag-ox TID, 2 g IV Mg sulfate x1 run 9/8, 17 g miralax/day, 20 mEq Klor-Con TID, 10 mEq IV KCl x4 runs 9/8. IVF; D5-NS @ 100 ml/hr (408 kcal).     NUTRITION - FOCUSED PHYSICAL EXAM:  Not appropriate for patient with chronic muscle-wasting disease (muscular dystrophy)  Diet Order:   Diet Order            Diet regular Room service appropriate? Yes; Fluid consistency: Thin  Diet effective now                 EDUCATION NEEDS:   No education needs have been identified at this time  Skin:  Skin Assessment: Reviewed RN Assessment  Last BM:  PTA/unknown  Height:   Ht Readings from Last 1 Encounters:  06/14/20 '5\' 2"'  (1.575 m)    Weight:   Wt  Readings from Last 1 Encounters:  06/14/20 34 kg    Estimated Nutritional Needs:  Kcal:  1530-1800 kcal Protein:  65-80 grams Fluid:  >/= 1.7 L/day     Jarome Matin, MS, RD, LDN, CNSC Inpatient Clinical Dietitian RD pager # available in [facility]  After hours/weekend pager # available in [facility]

## 2020-06-15 NOTE — Progress Notes (Signed)
Palliative care brief note  Consult received.  In the interim, Peter Hayes has transferred from hospitalist to [facility] service.  Discussed with Dr. Silas Flood and will hold on formal consult at this time.  Please call or reconsult if there are palliative needs with which we can be of assistance.  Micheline Rough, MD [place] Palliative Medicine Team [phone]  NO CHARGE NOTE

## 2020-06-15 NOTE — TOC Initial Note (Signed)
Transition of Care [facility]) - Initial/Assessment Note    Patient Details  Name: Peter Hayes MRN: [id number] Date of Birth: 1996-04-27  [facility] of [facility]) CM/SW Contact:    Golda Acre, RN Phone Number: 06/15/2020, 8:56 AM  Clinical Narrative:                 Admitted for sepsis, transitioned from vent to bipap. Iv abxc, iv levophed, k+=2.2 repleated this am, d5ns at100cc/hr, iv magso42gms x1,wbc-19.6/ Following for progression and toc needs  Expected Discharge Plan: Home/Self Care Barriers to Discharge: Continued Medical Work up   Patient Goals and CMS Choice Patient states their goals for this hospitalization and ongoing recovery are:: to return to home CMS Medicare.gov Compare Post Acute Care list provided to:: Patient Choice offered to / list presented to : Patient  Expected Discharge Plan and Services Expected Discharge Plan: Home/Self Care   Discharge Planning Services: CM Consult   Living arrangements for the past 2 months: Single Family Home                                      Prior Living Arrangements/Services Living arrangements for the past 2 months: Single Family Home Lives with:: Self Patient language and need for interpreter reviewed:: Yes Do you feel safe going back to the place where you live?: Yes      Need for Family Participation in Patient Care: Yes (Comment) Care giver support system in place?: Yes (comment)   Criminal Activity/Legal Involvement Pertinent to Current Situation/Hospitalization: No - Comment as needed  Activities of Daily Living Home Assistive Devices/Equipment: BIPAP, CPAP, Other (Comment) (tpn pump) ADL Screening (condition at time of admission) Patient's cognitive ability adequate to safely complete daily activities?: Yes Is the patient deaf or have difficulty hearing?: No Does the patient have difficulty seeing, even when wearing glasses/contacts?: No Does the patient have difficulty concentrating, remembering, or  making decisions?: No Patient able to express need for assistance with ADLs?: Yes Does the patient have difficulty dressing or bathing?: Yes Independently performs ADLs?: No Communication: Independent Dressing (OT): Needs assistance Is this a change from baseline?: Pre-admission baseline Grooming: Needs assistance Is this a change from baseline?: Pre-admission baseline Feeding: Needs assistance Is this a change from baseline?: Pre-admission baseline Bathing: Needs assistance Is this a change from baseline?: Pre-admission baseline Toileting: Needs assistance Is this a change from baseline?: Pre-admission baseline In/Out Bed: Needs assistance Is this a change from baseline?: Pre-admission baseline Walks in Home: Dependent Is this a change from baseline?: Pre-admission baseline Does the patient have difficulty walking or climbing stairs?: Yes Weakness of Legs: Both Weakness of Arms/Hands: Both  Permission Sought/Granted                  Emotional Assessment Appearance:: Appears stated age Attitude/Demeanor/Rapport: Engaged Affect (typically observed): Calm Orientation: : Oriented to Place, Oriented to Self, Oriented to  Time, Oriented to Situation Alcohol / Substance Use: Not Applicable Psych Involvement: No (comment)  Admission diagnosis:  Hypoglycemia [E16.2] Sepsis (HCC) [A41.9] Sepsis, due to unspecified organism, unspecified whether acute organ dysfunction present [facility]) [A41.9] Patient Active Problem List   Diagnosis Date Noted   Sepsis (HCC) 06/14/2020   Pneumomediastinum (HCC) 02/06/2020   Pressure injury of skin 09/17/2019   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, [address] [place] [address] Phone: [phone] Fax: [place], [address] [place] [address] Phone: [phone] Fax: [phone]  [id number] - [place], [place] - [address] AT [address] [place] [address] Phone: [phone] Fax: [phone]     Social Determinants of Health (SDOH) Interventions    Readmission Risk Interventions No flowsheet data found.

## 2020-06-15 NOTE — Progress Notes (Signed)
Pt became hypotensive but remained asymptomatic.  No significant response to 1L bolus or albumin.  Started Levophed with mild to moderate approval, continue to titrate for MAP > 60.

## 2020-06-16 LAB — BASIC METABOLIC PANEL
Anion gap: 6 (ref 5–15)
BUN: <5 mg/dL — ABNORMAL LOW (ref 6–20)
CO2: 22 mmol/L (ref 22–32)
Calcium: 8.4 mg/dL — ABNORMAL LOW (ref 8.9–10.3)
Chloride: 111 mmol/L (ref 98–111)
Creatinine, Ser: <0.3 mg/dL — ABNORMAL LOW (ref 0.61–1.24)
Glucose, Bld: 91 mg/dL (ref 70–99)
Potassium: 3.9 mmol/L (ref 3.5–5.1)
Sodium: 139 mmol/L (ref 135–145)

## 2020-06-16 LAB — PHOSPHORUS: Phosphorus: 1.9 mg/dL — ABNORMAL LOW (ref 2.5–4.6)

## 2020-06-16 LAB — CBC
HCT: 35.3 % — ABNORMAL LOW (ref 39.0–52.0)
Hemoglobin: 11.1 g/dL — ABNORMAL LOW (ref 13.0–17.0)
MCH: 26.9 pg (ref 26.0–34.0)
MCHC: 31.4 g/dL (ref 30.0–36.0)
MCV: 85.5 fL (ref 80.0–100.0)
Platelets: 147 10*3/uL — ABNORMAL LOW (ref 150–400)
RBC: 4.13 MIL/uL — ABNORMAL LOW (ref 4.22–5.81)
RDW: 15.8 % — ABNORMAL HIGH (ref 11.5–15.5)
WBC: 7.3 10*3/uL (ref 4.0–10.5)
nRBC: 0 % (ref 0.0–0.2)

## 2020-06-16 LAB — HEPATIC FUNCTION PANEL
ALT: 115 U/L — ABNORMAL HIGH (ref 0–44)
AST: 47 U/L — ABNORMAL HIGH (ref 15–41)
Albumin: 3.3 g/dL — ABNORMAL LOW (ref 3.5–5.0)
Alkaline Phosphatase: 115 U/L (ref 38–126)
Bilirubin, Direct: 0.2 mg/dL (ref 0.0–0.2)
Indirect Bilirubin: 1.2 mg/dL — ABNORMAL HIGH (ref 0.3–0.9)
Total Bilirubin: 1.4 mg/dL — ABNORMAL HIGH (ref 0.3–1.2)
Total Protein: 6.1 g/dL — ABNORMAL LOW (ref 6.5–8.1)

## 2020-06-16 LAB — GLUCOSE, CAPILLARY
Glucose-Capillary: 78 mg/dL (ref 70–99)
Glucose-Capillary: 83 mg/dL (ref 70–99)
Glucose-Capillary: 85 mg/dL (ref 70–99)
Glucose-Capillary: 95 mg/dL (ref 70–99)
Glucose-Capillary: 96 mg/dL (ref 70–99)

## 2020-06-16 LAB — MAGNESIUM: Magnesium: 1.8 mg/dL (ref 1.7–2.4)

## 2020-06-16 MED ORDER — POTASSIUM PHOSPHATES 15 MMOLE/5ML IV SOLN
15.0000 mmol | Freq: Once | INTRAVENOUS | Status: AC
  Administered 2020-06-16: 15 mmol via INTRAVENOUS
  Filled 2020-06-16: qty 5

## 2020-06-16 MED ORDER — SODIUM CHLORIDE 0.9 % IV SOLN
2.0000 g | INTRAVENOUS | Status: DC
  Filled 2020-06-16: qty 20

## 2020-06-16 MED ORDER — DEXTROSE IN LACTATED RINGERS 5 % IV SOLN: INTRAVENOUS | Status: DC

## 2020-06-16 MED ORDER — GLYCOPYRROLATE 0.2 MG/ML IJ SOLN
0.1000 mg | Freq: Once | INTRAMUSCULAR | Status: AC
  Administered 2020-06-16: 0.1 mg via INTRAVENOUS
  Filled 2020-06-16: qty 1

## 2020-06-16 MED FILL — Ondansetron HCl Inj 4 MG/2ML (2 MG/ML): INTRAMUSCULAR | Qty: 2 | Status: AC

## 2020-06-16 MED FILL — Ondansetron HCl Inj 4 MG/2ML (2 MG/ML): INTRAMUSCULAR | Qty: 2 | Status: CN

## 2020-06-16 NOTE — Progress Notes (Signed)
NUTRITION NOTE  Consult received for new TPN. Able to talk with Pharmacist who states plan to re-start TPN tomorrow (9/10). Patient receives TPN from [place].   Patient sleeping at this time. Dad is at bedside. He reports that patient does eat some at home, but eats very little despite encouragement. Patient often feels short of breath and like he has phlegm buildup with BiPAP off so this impacts PO intakes.   Dad reports that TPN pump was noted to be broken on Tuesday (9/7). He states that TPN runs 1700-1100 (18 hours/day).  Full assessment note done by this RD yesterday. Will continue to follow per protocol.    Estimated Nutritional Needs:  Kcal:  1530-1800 kcal Protein:  65-80 grams Fluid:  >/= 1.7 L/day     Jarome Matin, MS, RD, LDN, CNSC Inpatient Clinical Dietitian RD pager # available in [facility]  After hours/weekend pager # available in [facility]

## 2020-06-16 NOTE — Progress Notes (Signed)
Removed patient's bipap mask and attempted to place nasal trumpet but was unsuccessful.  Within seconds, pt became very anxious stating he could not breathe and asked that bipap be placed back on.  Pt stated he did not want me to retry placing the trumpet at this time.  RN aware.

## 2020-06-16 NOTE — Progress Notes (Signed)
[place] Progress Note Patient Name: Peter Hayes DOB: April 02, 1996 MRN: [id number]   Date of Service  06/16/2020  HPI/Events of Note  Copious secretions with difficulty clearing them.  eICU Interventions  Nasal trumpet ordered to facilitate naso-tracheal suctioning,  One dose of Robinul ordered to reduce secretions.        Kerry Kass Efraim Vanallen 06/16/2020, 8:27 PM

## 2020-06-16 NOTE — Progress Notes (Addendum)
NAME:  Peter Hayes, MRN:  [id number], DOB:  [birth year], LOS: 2 ADMISSION DATE:  06/14/2020, CONSULTATION DATE:  06/16/20 REFERRING MD:  Hospitalist CHIEF COMPLAINT:  Nausea, difficulty breathing   Brief History   24 year old with MD TPN dependent and on NIPPV at night and recently during the day who presented from home with onset of nausea and increased work of breathing at home admitted with severe sepsis of unclear source whom we are consulted for hypotension on pressors and developing septic shock.  History of present illness   See H+P dated 06/14/20.  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  Feeling better.  More awake, no abdominal discomfort.  No more nausea.  CT abdomen without significant acute findings. Consults:  PCCM  Procedures:  n/a  Significant Diagnostic Tests:  RUQ [place] clear CT abdomen pelvis 9/8: Fatty liver infiltration of liver, small volume ascites of unsure etiology, moderate right greater than left pleural effusion with basilar atelectasis  Micro Data:  Blood culture 9/8>>> MRSA screen negative UC 9/8 multiple orgs   Antimicrobials:  Cefepime 9/7 Vanc CTX flagyl 9/7 >> stopped 9/9 anidulafungin 9/8 >>  Interim history/subjective:  Feels a little better  Objective   Blood pressure 128/67, pulse 99, temperature (Abnormal) 97.5 F (36.4 C), temperature source Axillary, resp. rate (Abnormal) 26, height $RemoveBe'5\' 2"'[id number]$  (1.575 m), weight 34 kg, SpO2 97 %.    Vent Mode: BIPAP Set Rate:  [5 bmp] 5 bmp Vt Set:  [400 mL] 400 mL Delta P (Amplitude):  [18] 18   Intake/Output Summary (Last 24 hours) at 06/16/2020 0925 Last data filed at 06/16/2020 0800 Gross per 24 hour  Intake 3267.73 ml  Output 500 ml  Net 2767.73 ml   Filed Weights   06/14/20 1158  Weight: 34 kg    Examination: General: He is currently lying in bed and  in no acute distress.  The BiPAP mask is in place HEENT: Normocephalic atraumatic BiPAP mask in place mucous membranes still dry Pulmonary: Diminished bilaterally no accessory use currently Cardiac regular rate and rhythm Abdomen soft nontender Extremities are warm and dry Neuro baseline GU clear urine  Resolved Hospital Problem list   Anion gap metabolic acidosis  Assessment & Plan:  Septic Shock from abdominal source, although think there is a significant component of volume depletion 2/2 refeeding syndrome  -He is on long term TNA, so Risk of fungemia certainly a consideration to date nothing has grown in his blood.  Urine culture had multiple species present, suggesting possible contamination although I suppose urinary tract infection a consideration.  CT abdomen obtained without any intra-abdominal or intrapelvic acute findings.  Did have moderate right and small left pleural effusion Plan Continue IV hydration Continue to follow cultures, continue antifungal coverage for now until blood cultures resulted and rule out fungal infection today is day #2 Eraxis Continue ceftriaxone, currently day #2 Continue to wean norepinephrine for systolic blood pressure greater than 100 We will get echocardiogram, has right effusion in some degree of ascites.  Chronic hypoxemic respiratory failure due to NM weakness: Possibly worsened in setting of sepsis, metabolic acidosis. Plan Continue NIPPV Continue pulse oximetry  Right greater than left pleural effusion Plan We will proceed with diagnostic thoracentesis  Abnormal LFTs: Improving Plan Continue to trend  Mild thrombocytopenia. Platelets dropping over the course of hospitalization Plan We will continue to monitor  Severe protein calorie malnutrition: Due to NM weakness, TPN dependent. Report that TPN pump not working the night prior to arrival. Plan Resume TPN on 9/10 if cultures neg   Hypokalemia, hypophosphatemia: Refeeding?  Maybe TPN pump is longer standing issue Plan Replace and recheck  Best practice:  Diet: NPO, Resume TPN when/if blood cultures clear Pain/Anxiety/Delirium protocol (if indicated): avoid sedating meds, delirium precautions VAP protocol (if indicated): n/a DVT prophylaxis: lovenox SQ GI prophylaxis: n/a Glucose control: n/a Mobility: Bed rest Code Status: full Family Communication: with father at bedside Disposition: ICU, PCCM primary    Critical care time: [place]   ;Erick Colace ACNP-BC [place] Pager # [phone] OR # [phone] if no answer

## 2020-06-17 ENCOUNTER — Inpatient Hospital Stay (HOSPITAL_COMMUNITY): Payer: Managed Care, Other (non HMO)

## 2020-06-17 DIAGNOSIS — R0602 Shortness of breath: Secondary | ICD-10-CM

## 2020-06-17 DIAGNOSIS — J90 Pleural effusion, not elsewhere classified: Secondary | ICD-10-CM

## 2020-06-17 LAB — COMPREHENSIVE METABOLIC PANEL
ALT: 80 U/L — ABNORMAL HIGH (ref 0–44)
AST: 32 U/L (ref 15–41)
Albumin: 2.9 g/dL — ABNORMAL LOW (ref 3.5–5.0)
Alkaline Phosphatase: 90 U/L (ref 38–126)
Anion gap: 12 (ref 5–15)
BUN: <5 mg/dL — ABNORMAL LOW (ref 6–20)
CO2: 25 mmol/L (ref 22–32)
Calcium: 8.4 mg/dL — ABNORMAL LOW (ref 8.9–10.3)
Chloride: 101 mmol/L (ref 98–111)
Creatinine, Ser: <0.3 mg/dL — ABNORMAL LOW (ref 0.61–1.24)
Glucose, Bld: 190 mg/dL — ABNORMAL HIGH (ref 70–99)
Potassium: 3.4 mmol/L — ABNORMAL LOW (ref 3.5–5.1)
Sodium: 138 mmol/L (ref 135–145)
Total Bilirubin: 1.6 mg/dL — ABNORMAL HIGH (ref 0.3–1.2)
Total Protein: 5.1 g/dL — ABNORMAL LOW (ref 6.5–8.1)

## 2020-06-17 LAB — BODY FLUID CELL COUNT WITH DIFFERENTIAL
Lymphs, Fluid: 98 %
Monocyte-Macrophage-Serous Fluid: 1 % — ABNORMAL LOW (ref 50–90)
Neutrophil Count, Fluid: 1 % (ref 0–25)
Total Nucleated Cell Count, Fluid: 1064 cu mm — ABNORMAL HIGH (ref 0–1000)

## 2020-06-17 LAB — PHOSPHORUS: Phosphorus: 1.6 mg/dL — ABNORMAL LOW (ref 2.5–4.6)

## 2020-06-17 LAB — CBC
HCT: 30.9 % — ABNORMAL LOW (ref 39.0–52.0)
Hemoglobin: 9.9 g/dL — ABNORMAL LOW (ref 13.0–17.0)
MCH: 27 pg (ref 26.0–34.0)
MCHC: 32 g/dL (ref 30.0–36.0)
MCV: 84.2 fL (ref 80.0–100.0)
Platelets: 102 10*3/uL — ABNORMAL LOW (ref 150–400)
RBC: 3.67 MIL/uL — ABNORMAL LOW (ref 4.22–5.81)
RDW: 15.4 % (ref 11.5–15.5)
WBC: 3.6 10*3/uL — ABNORMAL LOW (ref 4.0–10.5)

## 2020-06-17 LAB — GLUCOSE, CAPILLARY
Glucose-Capillary: 76 mg/dL (ref 70–99)
Glucose-Capillary: 67 mg/dL — ABNORMAL LOW (ref 70–99)
Glucose-Capillary: 79 mg/dL (ref 70–99)
Glucose-Capillary: 99 mg/dL (ref 70–99)

## 2020-06-17 LAB — PROTEIN, PLEURAL OR PERITONEAL FLUID: Total protein, fluid: 3.9 g/dL

## 2020-06-17 LAB — ECHOCARDIOGRAM COMPLETE
Area-P 1/2: 3.99 cm2
Calc EF: 43 %
Height: 62 in
S' Lateral: 2.8 cm
Single Plane A2C EF: 44.8 %
Single Plane A4C EF: 42.2 %
Weight: 1199.3 oz

## 2020-06-17 LAB — PREALBUMIN: Prealbumin: 7.6 mg/dL — ABNORMAL LOW (ref 18–38)

## 2020-06-17 LAB — MAGNESIUM: Magnesium: 1.4 mg/dL — ABNORMAL LOW (ref 1.7–2.4)

## 2020-06-17 LAB — TRIGLYCERIDES: Triglycerides: 45 mg/dL (ref <150)

## 2020-06-17 LAB — LACTATE DEHYDROGENASE, PLEURAL OR PERITONEAL FLUID: LD, Fluid: 62 U/L — ABNORMAL HIGH (ref 3–23)

## 2020-06-17 MED ORDER — SODIUM CHLORIDE 0.9 % IV SOLN
INTRAVENOUS | Status: DC | PRN
  Administered 2020-06-17: 500 mL via INTRAVENOUS
  Administered 2020-06-19: 250 mL via INTRAVENOUS

## 2020-06-17 MED ORDER — POTASSIUM CHLORIDE 10 MEQ/100ML IV SOLN
INTRAVENOUS | Status: AC
  Filled 2020-06-17: qty 100

## 2020-06-17 MED ORDER — MAGNESIUM SULFATE 2 GM/50ML IV SOLN
2.0000 g | Freq: Once | INTRAVENOUS | Status: AC
  Administered 2020-06-17: 2 g via INTRAVENOUS
  Filled 2020-06-17: qty 50

## 2020-06-17 MED ORDER — POTASSIUM CHLORIDE 10 MEQ/100ML IV SOLN
10.0000 meq | INTRAVENOUS | Status: AC
  Administered 2020-06-17 (×4): 10 meq via INTRAVENOUS
  Filled 2020-06-17 (×3): qty 100

## 2020-06-17 MED ORDER — TRAVASOL 10 % IV SOLN
INTRAVENOUS | Status: DC
  Filled 2020-06-17: qty 338.4

## 2020-06-17 MED ORDER — SODIUM CHLORIDE 0.9 % IV SOLN
1.0000 g | INTRAVENOUS | Status: AC
  Administered 2020-06-17 – 2020-06-19 (×3): 1 g via INTRAVENOUS
  Filled 2020-06-17: qty 1
  Filled 2020-06-17: qty 10
  Filled 2020-06-17: qty 1

## 2020-06-17 MED ORDER — POTASSIUM PHOSPHATES 15 MMOLE/5ML IV SOLN
15.0000 mmol | Freq: Once | INTRAVENOUS | Status: AC
  Administered 2020-06-17: 15 mmol via INTRAVENOUS
  Filled 2020-06-17: qty 5

## 2020-06-17 MED ORDER — INSULIN ASPART 100 UNIT/ML ~~LOC~~ SOLN
0.0000 [IU] | Freq: Four times a day (QID) | SUBCUTANEOUS | Status: DC
  Administered 2020-06-18 – 2020-06-19 (×3): 1 [IU] via SUBCUTANEOUS

## 2020-06-17 MED ORDER — TRAVASOL 10 % IV SOLN: INTRAVENOUS | Status: DC

## 2020-06-17 MED ORDER — ACETAMINOPHEN 10 MG/ML IV SOLN
1000.0000 mg | Freq: Four times a day (QID) | INTRAVENOUS | Status: AC | PRN
  Administered 2020-06-17 (×2): 1000 mg via INTRAVENOUS
  Filled 2020-06-17 (×2): qty 100

## 2020-06-17 NOTE — Procedures (Signed)
Thoracentesis  Procedure Note  Peter Hayes  [id number]  Feb 04, 1996  Date:06/17/20  Time:3:05 PM   Provider Performing:Tamyka Bezio R Ariyannah Pauling   Procedure: Thoracentesis with imaging guidance (95284)  Indication(s) Pleural Effusion  Consent Risks of the procedure as well as the alternatives and risks of each were explained to the patient and/or caregiver.  Consent for the procedure was obtained and is signed in the bedside chart  Anesthesia Topical only with 1% lidocaine    Time Out Verified patient identification, verified procedure, site/side was marked, verified correct patient position, special equipment/implants available, medications/allergies/relevant history reviewed, required imaging and test results available.   Sterile Technique Maximal sterile technique including full sterile barrier drape, hand hygiene, sterile gown, sterile gloves, mask, hair covering, sterile ultrasound probe cover (if used).  Procedure Description Ultrasound was used to identify appropriate pleural anatomy for placement and overlying skin marked.  Area of drainage cleaned and draped in sterile fashion. Lidocaine was used to anesthetize the skin and subcutaneous tissue.  1200 cc's of serous appearing fluid was drained from the right pleural space. Catheter then removed and bandaid applied to site.   Complications/Tolerance None; patient tolerated the procedure well.    EBL none   Specimen(s) Pleural fluid sent for cyto, micro, chemistries

## 2020-06-17 NOTE — Progress Notes (Signed)
Pharmacy Brief Note -  Pharmacy consulted to dose ceftriaxone for a total of 5 day course for empiric UTI treatment.   Assessment: -9/7 UCx: multiple species -CTX 2 g IV q24h given 9/7 & 9/8  Plan: -Order ceftriaxone 1 g IV q24h x3 days to complete 5 total days of ABX  Pharmacy to sign off.   Lenis Noon, PharmD 06/17/20 11:53 AM

## 2020-06-17 NOTE — Progress Notes (Signed)
Nutrition Follow-up  DOCUMENTATION CODES:   Underweight  INTERVENTION:  - TPN initiation and management by Pharmacist.  Monitor magnesium, potassium, and phosphorus daily for at least 3 days, MD to replete as needed, as pt is at risk for refeeding syndrome given hypophosphatemia, hypomagnesemia, and mild hypokalemia requiring multiple runs of repletion.   NUTRITION DIAGNOSIS:   Increased nutrient needs related to acute illness as evidenced by estimated needs. -ongoing  GOAL:   Patient will meet greater than or equal to 90% of their needs -to be met with TPN regimen  MONITOR:   PO intake, Supplement acceptance, Labs, Weight trends, Other (Comment) (TPN regimen)  REASON FOR ASSESSMENT:   Consult New TPN/TNA  ASSESSMENT:   24 y.o. male with medical history of advanced muscular dystrophy with debility, severe protein calorie malnutrition on chronic TPN, and chronic respiratory failure on non-invasive positive pressure ventilation. He presented to [facility] ED for evaluation of nausea, abdominal pain, body aches, chills, and diaphoresis. In the ED, his father reported to staff that patient has had decreased ostomy output. He has LUE PICC for TPN, but father reported TPN pump recently broke. Father also reported that patient has a chronic cough productive of clear sputum.  Patient discussed in rounds this AM and with Pharmacist prior to rounds. No intakes documented. Double lumen PICC in place from PTA; placed at [facility] in 02/2020.   Full assessment note written by this RD on 9/8 outlining estimated nutrition needs and brief note written yesterday (9/9) after discussion with dad at bedside and with Pharmacist.   Plan is to start TPN via PICC today. He has not been weighed since admission on 9/7.    Labs reviewed; K: 3.4 mmol/l, BUN: <5 mg/dl, creatinine: <0.3 mg/dl, Ca: 8.4 mg/dl, Phos: 1.6 mg/dl, Mg: 1.4 mg/dl, ALT elevated but trending down. Medications reviewed; 400  mg mag-ox TID, 2 g IV Mg sulfate x1 run 9/10, 17 g miralax/day, 10 mEq IV KCl x4 runs 9/10, 15 mmol IV KPhos x1 run 9/9 and x1 run 9/10.    NUTRITION - FOCUSED PHYSICAL EXAM:  not appropriate for patient with chronic muscle-wasting disease (advanced muscular dystrophy)  Diet Order:   Diet Order            Diet regular Room service appropriate? Yes; Fluid consistency: Thin  Diet effective now                 EDUCATION NEEDS:   No education needs have been identified at this time  Skin:  Skin Assessment: Reviewed RN Assessment  Last BM:  PTA/unknown  Height:   Ht Readings from Last 1 Encounters:  06/14/20 '5\' 2"'  (1.575 m)    Weight:   Wt Readings from Last 1 Encounters:  06/14/20 34 kg     Estimated Nutritional Needs:  Kcal:  1530-1800 kcal Protein:  65-80 grams Fluid:  >/= 1.7 L/day     Jarome Matin, MS, RD, LDN, CNSC Inpatient Clinical Dietitian RD pager # available in [facility]  After hours/weekend pager # available in [facility]

## 2020-06-17 NOTE — Progress Notes (Signed)
  Echocardiogram 2D Echocardiogram has been performed.  Peter Hayes 06/17/2020, 2:00 PM

## 2020-06-17 NOTE — Progress Notes (Signed)
PHARMACY - TOTAL PARENTERAL NUTRITION CONSULT NOTE   Indication: Malnutrition, pt chronically on TPN   Patient Measurements: Height: 5\' 2"  (157.5 cm) Weight: 34 kg (74 lb 15.3 oz) IBW/kg (Calculated) : 54.6 TPN AdjBW (KG): 34 Body mass index is 13.71 kg/m.  Assessment: Pt is a 38 yoM with PMH significant for advanced muscular dystrophy, severe protein calorie malnutrition on chronic TPN, chronic respiratory failure. Pt had TPN pump failure PTA. Pharmacy consulted to resume TPN today (was held 9/7 - 9/9).  Glucose / Insulin: No hx DM. BG 76 - 127 Electrolytes: K (3.4), Phos (1.6), Mg (1.4) all low. Other lytes WNL, including CorrCa (9.3). Renal: SCr <0.3, BUN <5. LFTs / TGs: LFTs improved (AST WNL, ALT 80); TG WNL (45) Prealbumin / albumin: 7.6/2.9 - both low Intake / Output; MIVF: UOP 525 mL. I/O Net: + 996 mL. No MIVF. GI Imaging: -9/8 CT Abd: small volume ascites Surgeries / Procedures:   Central access: Double lumen PICC (06/14/20) TPN start date: 06/17/20  Nutritional Goals (per RD recommendation on 9/9): kCal: 1530-1800, Protein: 65-80, Fluid: >/= 1.7 L/day  Goal TPN rate is 70 mL/hr (provides 79 g of protein and 1626 kcals per day)  Current Nutrition:  -Regular diet (no meal intake charted), TPN  Plan:  Now:  Magnesium 2 g IV once  Potassium Phosphate 15 mmol IV once  KCl 10 mEq IV x4 runs per MD  At 1800:  Start TPN at 30 mL/hr at 1800  Electrolytes in TPN: Standard  24mEq/L of Na, 79mEq/L of K, 105mEq/L of Ca, 61mEq/L of Mg, and 61mmol/L of Phos.   Cl:Ac ratio 1:1  Add standard MVI and trace elements to TPN  Initiate Sensitive q6h SSI and adjust as needed   No MIVF ordered. Management per MD.  Monitor TPN labs on Mon/Thurs, check electrolytes with AM labs tomorrow  Lenis Noon, PharmD 06/17/2020,10:35 AM

## 2020-06-17 NOTE — Progress Notes (Signed)
[place] Progress Note Patient Name: Jaylene Schrom DOB: [birth year] MRN: [id number]   Date of Service  06/17/2020  HPI/Events of Note  Patient c/o back pain, he normally takes oral Tylenol but he is unable at this time due to BIPAP and swallowing difficulties.  eICU Interventions   iv Tylenol ordered prn pain x 4 doses.        Kerry Kass Archie Shea 06/17/2020, 1:31 AM

## 2020-06-17 NOTE — Progress Notes (Addendum)
NAME:  Peter Hayes, MRN:  [id number], DOB:  12/14/1995, LOS: 3 ADMISSION DATE:  06/14/2020, CONSULTATION DATE:  06/17/20 REFERRING MD:  Hospitalist CHIEF COMPLAINT:  Nausea, difficulty breathing   Brief History   24 year old with MD TPN dependent and on NIPPV at night and recently during the day who presented from home with onset of nausea and increased work of breathing at home admitted with severe sepsis of unclear source whom we are consulted for hypotension on pressors and developing septic shock.  History of present illness   See H+P dated 06/14/20.  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  Feeling better.  More awake, no abdominal discomfort.  No more nausea.  CT abdomen without significant acute findings. 9/10 off antifungals. Better. Off pressors. Working dx UTI sepsis w/ severe dehydration.  Consults:  PCCM  Procedures:  n/a  Significant Diagnostic Tests:  RUQ [place] clear CT abdomen pelvis 9/8: Fatty liver infiltration of liver, small volume ascites of unsure etiology, moderate right greater than left pleural effusion with basilar atelectasis  Micro Data:  Blood culture 9/8>>> MRSA screen negative UC 9/8 multiple orgs   Antimicrobials:  Cefepime 9/7 Vanc CTX flagyl 9/7 >> stopped 9/9 anidulafungin 9/8 >>  Interim history/subjective:  More awake no distress.   Objective   Blood pressure 117/62, pulse 79, temperature 98.2 F (36.8 C), temperature source Axillary, resp. rate 20, height $RemoveBe'5\' 2"'[id number]$  (1.575 m), weight 34 kg, SpO2 99 %.    Vent Mode: BIPAP Set Rate:  [5 bmp] 5 bmp Vt Set:  [400 mL] 400 mL Delta P (Amplitude):  [8-18] 8   Intake/Output Summary (Last 24 hours) at 06/17/2020 6378 Last data filed at 06/17/2020 0400 Gross per 24 hour  Intake 1364.23 ml  Output 400 ml  Net 964.23 ml   Filed Weights   06/14/20 1158    Weight: 34 kg    Examination:  General this is a 24 year old male. Resting in bed. Still on BIPAP. Feels better  HENT NCAT BIPAP mask in place. No JVD. MM are dry  Neuro awake. Generalized weakness.  Pulm decreased t/o. No accessory use  Card rrr  abd not tender  Ext warm and dry  gu cl yellow   Resolved Hospital Problem list   Anion gap metabolic acidosis  Assessment & Plan:  Septic Shock from abdominal source, although think there is a significant component of volume depletion 2/2 refeeding syndrome  -He is on long term TNA, so Risk of fungemia certainly a consideration to date nothing has grown in his blood.  Urine culture had multiple species present, suggesting possible contamination although I suppose urinary tract infection a consideration.  CT abdomen obtained without any intra-abdominal or intrapelvic acute findings.  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 Better.  Had required NIPPV for better part of 2 months at home. Will prob eventually need trach/vent  Plan Cont NIPPV Pulse ox NTS PRN  Right greater than left pleural effusion Plan Diagnostic thora today   Abnormal LFTs: Improving Plan trend  Mild thrombocytopenia. Platelets dropping over the course of hospitalization Plan Cont to trend  May need  to send HIT panel if gets worse  Severe protein calorie malnutrition: Due to NM weakness, TPN dependent. Report that TPN pump not working the night prior to arrival. Plan Resume nocturnal TPN   Hypokalemia, hypophosphatemia, hypomagnesemia : Refeeding? Maybe TPN pump is longer standing issue Plan Replace and recheck   Best practice:  Diet: NPO, Resume TPN when/if blood cultures  clear Pain/Anxiety/Delirium protocol (if indicated): avoid sedating meds, delirium precautions VAP protocol (if indicated): n/a DVT prophylaxis: lovenox SQ GI prophylaxis: n/a Glucose control: n/a Mobility: Bed rest Code Status: full Family Communication: with father at bedside Disposition: to SDU-->triad in am    Critical care time:  None   ;Erick Colace ACNP-BC [place] Pager # [phone] OR # [phone] if no answer

## 2020-06-18 ENCOUNTER — Inpatient Hospital Stay (HOSPITAL_COMMUNITY): Payer: Managed Care, Other (non HMO)

## 2020-06-18 LAB — COMPREHENSIVE METABOLIC PANEL
ALT: 68 U/L — ABNORMAL HIGH (ref 0–44)
AST: 28 U/L (ref 15–41)
Albumin: 3 g/dL — ABNORMAL LOW (ref 3.5–5.0)
Alkaline Phosphatase: 100 U/L (ref 38–126)
Anion gap: 11 (ref 5–15)
BUN: <5 mg/dL — ABNORMAL LOW (ref 6–20)
CO2: 28 mmol/L (ref 22–32)
Calcium: 8.3 mg/dL — ABNORMAL LOW (ref 8.9–10.3)
Chloride: 97 mmol/L — ABNORMAL LOW (ref 98–111)
Creatinine, Ser: <0.3 mg/dL — ABNORMAL LOW (ref 0.61–1.24)
Glucose, Bld: 104 mg/dL — ABNORMAL HIGH (ref 70–99)
Potassium: 3.5 mmol/L (ref 3.5–5.1)
Sodium: 136 mmol/L (ref 135–145)
Total Bilirubin: 1.5 mg/dL — ABNORMAL HIGH (ref 0.3–1.2)
Total Protein: 5.5 g/dL — ABNORMAL LOW (ref 6.5–8.1)

## 2020-06-18 LAB — CBC
HCT: 33.1 % — ABNORMAL LOW (ref 39.0–52.0)
Hemoglobin: 10.7 g/dL — ABNORMAL LOW (ref 13.0–17.0)
MCH: 26.7 pg (ref 26.0–34.0)
MCHC: 32.3 g/dL (ref 30.0–36.0)
MCV: 82.5 fL (ref 80.0–100.0)
Platelets: 113 10*3/uL — ABNORMAL LOW (ref 150–400)
RBC: 4.01 MIL/uL — ABNORMAL LOW (ref 4.22–5.81)
RDW: 15.1 % (ref 11.5–15.5)
WBC: 3.8 10*3/uL — ABNORMAL LOW (ref 4.0–10.5)
nRBC: 0 % (ref 0.0–0.2)

## 2020-06-18 LAB — GLUCOSE, CAPILLARY
Glucose-Capillary: 108 mg/dL — ABNORMAL HIGH (ref 70–99)
Glucose-Capillary: 107 mg/dL — ABNORMAL HIGH (ref 70–99)
Glucose-Capillary: 136 mg/dL — ABNORMAL HIGH (ref 70–99)
Glucose-Capillary: 129 mg/dL — ABNORMAL HIGH (ref 70–99)

## 2020-06-18 LAB — PHOSPHORUS: Phosphorus: 2.1 mg/dL — ABNORMAL LOW (ref 2.5–4.6)

## 2020-06-18 LAB — MAGNESIUM: Magnesium: 1.7 mg/dL (ref 1.7–2.4)

## 2020-06-18 MED ORDER — SODIUM CHLORIDE 0.9% FLUSH: 10.0000 mL | INTRAVENOUS | Status: DC | PRN

## 2020-06-18 MED ORDER — TRAVASOL 10 % IV SOLN
INTRAVENOUS | Status: AC
  Filled 2020-06-18: qty 451.2

## 2020-06-18 MED ORDER — MAGNESIUM SULFATE 2 GM/50ML IV SOLN
2.0000 g | Freq: Once | INTRAVENOUS | Status: AC
  Administered 2020-06-18: 2 g via INTRAVENOUS
  Filled 2020-06-18: qty 50

## 2020-06-18 MED ORDER — ACETYLCYSTEINE 20 % IN SOLN
4.0000 mL | Freq: Two times a day (BID) | RESPIRATORY_TRACT | Status: DC
  Administered 2020-06-18 – 2020-06-20 (×5): 4 mL via RESPIRATORY_TRACT
  Filled 2020-06-18 (×5): qty 4

## 2020-06-18 MED ORDER — POTASSIUM PHOSPHATES 15 MMOLE/5ML IV SOLN
10.0000 mmol | Freq: Once | INTRAVENOUS | Status: AC
  Administered 2020-06-18: 10 mmol via INTRAVENOUS
  Filled 2020-06-18: qty 3.33

## 2020-06-18 MED ORDER — GUAIFENESIN ER 600 MG PO TB12
600.0000 mg | ORAL_TABLET | Freq: Two times a day (BID) | ORAL | Status: DC
  Administered 2020-06-18: 600 mg via ORAL
  Filled 2020-06-18 (×2): qty 1

## 2020-06-18 MED ORDER — SODIUM CHLORIDE 0.9% FLUSH
10.0000 mL | Freq: Two times a day (BID) | INTRAVENOUS | Status: DC
  Administered 2020-06-18 – 2020-06-20 (×6): 10 mL

## 2020-06-18 MED ORDER — POTASSIUM CHLORIDE 10 MEQ/50ML IV SOLN
10.0000 meq | INTRAVENOUS | Status: DC
  Filled 2020-06-18: qty 50

## 2020-06-18 MED ORDER — POTASSIUM CHLORIDE 10 MEQ/50ML IV SOLN
10.0000 meq | INTRAVENOUS | Status: AC
  Administered 2020-06-18 (×3): 10 meq via INTRAVENOUS
  Filled 2020-06-18 (×2): qty 50

## 2020-06-18 NOTE — Progress Notes (Signed)
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 Laurence Spates 01/10/2018 Chronic respiratory failure on 1 L oxygen Iron deficiency anemia Chronic underlying sinus tachycardia  Admitted 06/14/2020 nausea without emesis subjective fever-has a chronic ostomy and a PICC line in place where he received TPN-reported TPN pump recently broke  Work-up = severe sepsis WBC 31 AST/ALT 183/165 bilirubin 2.6 lactic acid 1.1 CXR = right lung atelectasis RUQ ultrasound?  Underlying liver disease + steatosis without gallbladder pathology Rx empiric vancomycin cefepime Flagyl  9/8 developed hypotension?  Starvation ketosis-found to have multiorganism UTI Rx for 5 days Seen by critical care because of hypotension which was refractory initially found to have bilateral pleural effusions status post thoracentesis 9/10   Assessment & Plan:   Principal Problem:   Sepsis ([place]) Active Problems:   Muscular dystrophy ([place])   Hypoglycemia without diagnosis of diabetes mellitus   1. Severe sepsis on admission?  UTI?  Pneumonia a. Urine/blood cultures 9/7 -, ultrasound on admission 9/7 - for cholecystitis b. Had thoracentesis for right-sided effusion 9/10-those cultures also pending c. Continue ceftriaxone at this time narrow as appropriate d. We will repeat 1 view chest x-ray 9/12 given last x-ray on 9/11 suggestive of right lobe infiltrate although this may be remnant of fluid 2. Hypotension?  Starvation ketosis with deranged LFTs 3. Risk of refeeding syndrome a. Long discussion with father-patient has not been eating for several months properly and his appetite has diminished b. Patient's TPN pump malfunctioned on  06/13/2020 and he was now getting feeds c. A component of his lab abnormalities with deranged electrolytes is starvation ketosis-replace micronutrients phosphorus and magnesium d. LFTs are improving significantly since admission although bilirubin is slight Lee elevated probably because of the above 4. Right sided pleural effusion status post thoracentesis 9/10 a. Etiology unclear, total nucleated cells >1060, 98 lymphocytes argues against infection b. Patient has had this before as a source of dyspnea from cryptogenic causes c. Repeat x-ray 9/11 shows no reaccumulation of fluid-for completion sake repeat x-ray 9/12 and monitor 5. Advanced Duchenne's muscular dystrophy usually on ventilator trilogy/1 L of oxygen at baseline a. Patient does not have neurologist used to follow in the past with [facility] relates that he was not satisfied with the care there b. We will reach out to [facility] to help coordinate care c. Patient may require goals of care in the next several days to discuss overall planning and outcomes 6. Chronic debility and muscular dystrophy requiring TPN for nutrition supplementation a. Patient cannot keep down enough calories p.o. and has not been able to do so for several months according to father b. He had TPN machine malfunction at home for several days-appreciate reinitiation by dietitian currently advancing as per them c. If his muscular dystrophy is advancing, he may require consideration after goals of care for either PEG tube or other planning d. This is to be discussed ongoing with family 7. Prior iron deficiency anemia 8. Chronic underlying sinus tachycardia a. -Monitor  DVT prophylaxis: Lovenox Code Status: Full code Family Communication: Discussed with his father at the bedside Disposition:   Status is: Inpatient  Remains inpatient appropriate because:Persistent severe electrolyte disturbances, Ongoing active pain requiring inpatient pain management and IV  treatments appropriate due  to intensity of illness or inability to take PO   Dispo: The patient is from: Home              Anticipated d/c is to: Home              Anticipated d/c date is: 3 days              Patient currently is not medically stable to d/c.  Consultants:   None currently  Procedures: Multiple  Antimicrobials: Cefepime   Subjective: Patient is able to verbalize but then falls asleep-father gives most of the history-patient usually sleeps throughout the day but awakens in the evening He is able to tell me patient that he is doing fair he has a little bit of discomfort in his abdomen Father relates that he has been bringing up more sputum since the thoracentesis on 9/10 He has been on the trilogy vent but was able to come off earlier today and use less oxygen according to nursing staff   Objective: Vitals:   06/18/20 0400 06/18/20 0414 06/18/20 0500 06/18/20 0600  BP: 114/61 114/61 115/71 118/68  Pulse: 61 85 80 60  Resp: (!) 25 19 (!) 26 20  Temp: 98 F (36.7 C)     TempSrc: Axillary     SpO2: 100% 100% 99% 100%  Weight:      Height:        Intake/Output Summary (Last 24 hours) at 06/18/2020 0718 Last data filed at 06/18/2020 0600 Gross per 24 hour  Intake 1294.34 ml  Output 1050 ml  Net 244.34 ml   Filed Weights   06/14/20 1158  Weight: 34 kg    Examination:  General exam: Chronically debilitated cachectic male no icterus no pallor Respiratory system: Bilaterally decreased air entry in anterolateral fields did not examine posteriorly Cardiovascular system: Sinus tachycardia on exam S1-S2 no murmur Gastrointestinal system: Soft nontender no rebound. Central nervous system: Cannot raise limbs of bed power is severely limited grade 0/5 sensory is intact Extremities: No lower extremity edema he has protective dressings on lower limbs I did not examine his sacrum he has some contractures Skin: As above Psychiatry: Sleepy cannot assess  Data  Reviewed: I have personally reviewed following labs and imaging studies BUNs/creatinine 5/0.3 Phosphorus 2.1 Magnesium 1.7 AST/ALT down from 183/165 on admission currently 28/68, alk phos 100 WBC 3.8 hemoglobin 10.7 MCV 82 platelet 113   Radiology Studies: DG Chest Port 1 View  Result Date: 06/18/2020 CLINICAL DATA:  Per order- Acute respiratory failure EXAM: PORTABLE CHEST 1 VIEW COMPARISON:  06/14/2020 FINDINGS: PICC line unchanged. Normal cardiac silhouette. Lungs are hyperinflated. There is again demonstrated RIGHT upper lobe streaky opacity representing atelectasis or infiltrate. No worsening. Lung bases are clear. No pneumothorax. IMPRESSION: 1. Stable support apparatus. 2. Hyperinflated lungs. 3. Stable RIGHT upper lobe atelectasis or infiltrate. Electronically Signed   By: Suzy Bouchard M.D.   On: 06/18/2020 04:28   ECHOCARDIOGRAM COMPLETE  Result Date: 06/17/2020    ECHOCARDIOGRAM REPORT   Patient Name:   KIANO TERRIEN Date of Exam: 06/17/2020 Medical Rec #:  [id number]  Height:       62.0 in Accession #:    [id number] Weight:       75.0 lb Date of Birth:  [birth year]  BSA:          1.260 m Patient Age:    24 years   BP:           117/62 mmHg Patient Gender:  M          HR:           63 bpm. Exam Location:  Inpatient Procedure: 2D Echo, Cardiac Doppler and Color Doppler Indications:    R06.02 SOB  History:        Patient has prior history of Echocardiogram examinations, most                 recent 05/16/2018. Abnormal ECG; Risk Factors:Hypertension.                 Pericardial effusion.  Sonographer:    Roseanna Rainbow RDCS Referring Phys: [id number] PETER E BABCOCK  Sonographer Comments: Technically difficult study due to poor echo windows and suboptimal subcostal window. Patient has very difficult windows and limited mobility. Patient is on bipap. IMPRESSIONS  1. Left ventricular ejection fraction, by estimation, is 60 to 65%. The left ventricle has normal function. The left ventricle has no regional wall  motion abnormalities. Left ventricular diastolic parameters were normal.  2. Right ventricular systolic function is normal. The right ventricular size is normal. There is normal pulmonary artery systolic pressure.  3. The mitral valve is normal in structure. No evidence of mitral valve regurgitation. No evidence of mitral stenosis.  4. The aortic valve is normal in structure. Aortic valve regurgitation is not visualized. No aortic stenosis is present. FINDINGS  Left Ventricle: Left ventricular ejection fraction, by estimation, is 60 to 65%. The left ventricle has normal function. The left ventricle has no regional wall motion abnormalities. The left ventricular internal cavity size was normal in size. There is  no left ventricular hypertrophy. Left ventricular diastolic parameters were normal. Right Ventricle: The right ventricular size is normal. No increase in right ventricular wall thickness. Right ventricular systolic function is normal. There is normal pulmonary artery systolic pressure. The tricuspid regurgitant velocity is 2.05 m/s, and  with an assumed right atrial pressure of 3 mmHg, the estimated right ventricular systolic pressure is 00.7 mmHg. Left Atrium: Left atrial size was normal in size. Right Atrium: Right atrial size was normal in size. Pericardium: There is no evidence of pericardial effusion. Mitral Valve: The mitral valve is normal in structure. No evidence of mitral valve regurgitation. No evidence of mitral valve stenosis. Tricuspid Valve: The tricuspid valve is normal in structure. Tricuspid valve regurgitation is trivial. Aortic Valve: The aortic valve is normal in structure. Aortic valve regurgitation is not visualized. No aortic stenosis is present. Pulmonic Valve: The pulmonic valve was normal in structure. Pulmonic valve regurgitation is not visualized. Aorta: The aortic root and ascending aorta are structurally normal, with no evidence of dilitation. IAS/Shunts: The atrial septum is  grossly normal.  LEFT VENTRICLE PLAX 2D LVIDd:         4.40 cm     Diastology LVIDs:         2.80 cm     LV e' medial:    10.60 cm/s LV PW:         1.20 cm     LV E/e' medial:  8.4 LV IVS:        0.80 cm     LV e' lateral:   12.00 cm/s LVOT diam:     1.70 cm     LV E/e' lateral: 7.4 LV SV:         44 LV SV Index:   35 LVOT Area:     2.27 cm  LV Volumes (MOD) LV vol d, MOD A2C: 97.2 ml  LV vol d, MOD A4C: 58.6 ml LV vol s, MOD A2C: 53.7 ml LV vol s, MOD A4C: 33.9 ml LV SV MOD A2C:     43.5 ml LV SV MOD A4C:     58.6 ml LV SV MOD BP:      32.4 ml RIGHT VENTRICLE RV S prime:     9.36 cm/s TAPSE (M-mode): 1.3 cm LEFT ATRIUM             Index       RIGHT ATRIUM          Index LA diam:        3.10 cm 2.46 cm/m  RA Area:     8.10 cm LA Vol (A2C):   22.2 ml 17.62 ml/m RA Volume:   19.50 ml 15.48 ml/m LA Vol (A4C):   22.5 ml 17.86 ml/m LA Biplane Vol: 24.2 ml 19.21 ml/m  AORTIC VALVE LVOT Vmax:   91.40 cm/s LVOT Vmean:  65.100 cm/s LVOT VTI:    0.192 m  AORTA Ao Root diam: 2.70 cm Ao Asc diam:  2.50 cm MITRAL VALVE               TRICUSPID VALVE MV Area (PHT): 3.99 cm    TR Peak grad:   16.8 mmHg MV Decel Time: 190 msec    TR Vmax:        205.00 cm/s MV E velocity: 88.70 cm/s MV A velocity: 61.70 cm/s  SHUNTS MV E/A ratio:  1.44        Systemic VTI:  0.19 m                            Systemic Diam: 1.70 cm Mertie Moores MD Electronically signed by Mertie Moores MD Signature Date/Time: 06/17/2020/2:19:31 PM    Final      Scheduled Meds: . chlorhexidine  15 mL Mouth Rinse BID  . Chlorhexidine Gluconate Cloth  6 each Topical Daily  . enoxaparin (LOVENOX) injection  30 mg Subcutaneous Q24H  . insulin aspart  0-9 Units Subcutaneous Q6H  . magnesium oxide  400 mg Oral TID  . mouth rinse  15 mL Mouth Rinse q12n4p  . polyethylene glycol  17 g Oral Daily  . sodium chloride flush  3 mL Intravenous Q12H   Continuous Infusions: . sodium chloride 500 mL (06/17/20 0818)  . cefTRIAXone (ROCEPHIN)  IV Stopped (06/17/20  1350)  . TPN ADULT (ION) 30 mL/hr at 06/17/20 1741     LOS: 4 days    Time spent: 52 minutes  Nita Sells, MD [facility] To contact the attending provider between 7A-7P or the covering provider during after hours 7P-7A, please log into the web site [email or web address] and access using universal [place] password for that web site. If you do not have the password, please call the hospital operator.  06/18/2020, 7:18 AM

## 2020-06-18 NOTE — Progress Notes (Signed)
PHARMACY - TOTAL PARENTERAL NUTRITION CONSULT NOTE   Indication: Malnutrition, pt chronically on TPN   Patient Measurements: Height: 5\' 2"  (157.5 cm) Weight: 34 kg (74 lb 15.3 oz) IBW/kg (Calculated) : 54.6 TPN AdjBW (KG): 34 Body mass index is 13.71 kg/m.  Assessment: Pt is a 71 yoM with PMH significant for advanced muscular dystrophy, severe protein calorie malnutrition on chronic TPN, chronic respiratory failure. Pt had TPN pump failure PTA. Pharmacy consulted to resume TPN on 9/10 (was held 9/7 - 9/9).  Glucose / Insulin: No hx DM. BG 99-108 since TPN initiated. Goal BG 100-150. No SSI required. Electrolytes: K (3.5), Mg (1.7) on lower end of normal. Phos (2.1) low but improving. Other lytes WNL, including CorrCa (9.1). Cl slightly low. Renal: SCr <0.3, BUN <5. LFTs / TGs: LFTs improved (AST WNL, ALT 68 and trending down); TG WNL (45) Prealbumin / albumin: 7.6/3 - both low Intake / Output; MIVF: UOP 1050 mL (improved). I/O Net: + 244 mL. No MIVF. GI Imaging: -9/8 CT Abd: small volume ascites Surgeries / Procedures:   Central access: Double lumen PICC (06/14/20) TPN start date: 06/17/20  Nutritional Goals (per RD recommendation on 9/10): kCal: 1530-1800, Protein: 65-80, Fluid: >/= 1.7 L/day  Goal TPN rate is 70 mL/hr (provides 79 g of protein and 1626 kcals per day)  Current Nutrition:  -Regular diet (no meal intake charted), TPN  Plan:  Now:  Magnesium 2 g IV once  Potassium Phosphate 10 mmol IV once  KCl 10 mEq IV x3 runs  At 1800:  Increase TPN slightly to 40 mL/hr at 1800  Electrolytes in TPN: Standard; no changes  54mEq/L of Na, 63mEq/L of K, 28mEq/L of Ca, 43mEq/L of Mg, and 75mmol/L of Phos.   Cl:Ac ratio 2:1  Add standard MVI and trace elements to TPN  Continue Sensitive q6h SSI and adjust as needed   No MIVF ordered. Management per MD.  Monitor TPN labs on Mon/Thurs, check electrolytes with AM labs tomorrow  Lenis Noon, PharmD 06/18/2020,7:56  AM

## 2020-06-19 ENCOUNTER — Inpatient Hospital Stay (HOSPITAL_COMMUNITY): Payer: Managed Care, Other (non HMO)

## 2020-06-19 LAB — BASIC METABOLIC PANEL
Anion gap: 9 (ref 5–15)
BUN: 6 mg/dL (ref 6–20)
CO2: 26 mmol/L (ref 22–32)
Calcium: 8.6 mg/dL — ABNORMAL LOW (ref 8.9–10.3)
Chloride: 101 mmol/L (ref 98–111)
Creatinine, Ser: <0.3 mg/dL — ABNORMAL LOW (ref 0.61–1.24)
Glucose, Bld: 112 mg/dL — ABNORMAL HIGH (ref 70–99)
Potassium: 3.8 mmol/L (ref 3.5–5.1)
Sodium: 136 mmol/L (ref 135–145)

## 2020-06-19 LAB — GLUCOSE, BODY FLUID OTHER: Glucose, Body Fluid Other: 80 mg/dL

## 2020-06-19 LAB — GLUCOSE, CAPILLARY
Glucose-Capillary: 111 mg/dL — ABNORMAL HIGH (ref 70–99)
Glucose-Capillary: 113 mg/dL — ABNORMAL HIGH (ref 70–99)
Glucose-Capillary: 115 mg/dL — ABNORMAL HIGH (ref 70–99)
Glucose-Capillary: 140 mg/dL — ABNORMAL HIGH (ref 70–99)

## 2020-06-19 LAB — CBC WITH DIFFERENTIAL/PLATELET
Abs Immature Granulocytes: 0.01 10*3/uL (ref 0.00–0.07)
Basophils Absolute: 0 10*3/uL (ref 0.0–0.1)
Basophils Relative: 1 %
Eosinophils Absolute: 0.1 10*3/uL (ref 0.0–0.5)
Eosinophils Relative: 3 %
HCT: 33.2 % — ABNORMAL LOW (ref 39.0–52.0)
Hemoglobin: 11 g/dL — ABNORMAL LOW (ref 13.0–17.0)
Immature Granulocytes: 0 %
Lymphocytes Relative: 46 %
Lymphs Abs: 2.1 10*3/uL (ref 0.7–4.0)
MCH: 27 pg (ref 26.0–34.0)
MCHC: 33.1 g/dL (ref 30.0–36.0)
MCV: 81.6 fL (ref 80.0–100.0)
Monocytes Absolute: 0.5 10*3/uL (ref 0.1–1.0)
Monocytes Relative: 10 %
Neutro Abs: 1.8 10*3/uL (ref 1.7–7.7)
Neutrophils Relative %: 40 %
Platelets: 133 10*3/uL — ABNORMAL LOW (ref 150–400)
RBC: 4.07 MIL/uL — ABNORMAL LOW (ref 4.22–5.81)
RDW: 15.3 % (ref 11.5–15.5)
WBC: 4.6 10*3/uL (ref 4.0–10.5)
nRBC: 0 % (ref 0.0–0.2)

## 2020-06-19 LAB — PHOSPHORUS: Phosphorus: 2.9 mg/dL (ref 2.5–4.6)

## 2020-06-19 LAB — CULTURE, BLOOD (ROUTINE X 2)
Culture: NO GROWTH
Special Requests: ADEQUATE

## 2020-06-19 LAB — ALBUMIN, FLUID (OTHER): Albumin, Body Fluid Other: 2.3 g/dL

## 2020-06-19 LAB — MAGNESIUM: Magnesium: 1.9 mg/dL (ref 1.7–2.4)

## 2020-06-19 MED ORDER — TRAVASOL 10 % IV SOLN: INTRAVENOUS | Status: DC

## 2020-06-19 MED ORDER — TRAVASOL 10 % IV SOLN
INTRAVENOUS | Status: AC
  Filled 2020-06-19: qty 789.6

## 2020-06-19 MED ORDER — GLYCOPYRROLATE 0.2 MG/ML IJ SOLN
0.1000 mg | Freq: Once | INTRAMUSCULAR | Status: AC
  Administered 2020-06-19: 0.1 mg via INTRAVENOUS
  Filled 2020-06-19: qty 1

## 2020-06-19 MED ORDER — GUAIFENESIN 100 MG/5ML PO SOLN
15.0000 mL | Freq: Four times a day (QID) | ORAL | Status: DC
  Administered 2020-06-19 – 2020-06-21 (×9): 300 mg via ORAL
  Filled 2020-06-19 (×5): qty 20
  Filled 2020-06-19 (×5): qty 10

## 2020-06-19 NOTE — Progress Notes (Signed)
PHARMACY - TOTAL PARENTERAL NUTRITION CONSULT NOTE   Indication: Malnutrition, pt chronically on TPN   Patient Measurements: Height: 5\' 2"  (157.5 cm) Weight: 34 kg (74 lb 15.3 oz) IBW/kg (Calculated) : 54.6 TPN AdjBW (KG): 34 Body mass index is 13.71 kg/m.  Assessment: Pt is a 30 yoM with PMH significant for advanced muscular dystrophy, severe protein calorie malnutrition on chronic TPN, chronic respiratory failure. Pt had TPN pump failure PTA. Pharmacy consulted to resume TPN on 9/10 (was held 9/7 - 9/9).  Glucose / Insulin: No hx DM. BG 107-136 is WNL. Goal BG 100-150. 2 units SSI/24 hours. Electrolytes: All electrolytes WNL, including CorrCa (9.4) Renal: SCr <0.3, BUN 6. LFTs / TGs: LFTs improved (AST WNL, ALT 68 and trending down); TG WNL (45) Prealbumin / albumin: 7.6/3 - both low Intake / Output; MIVF: UOP 1625 mL (increasing). I/O Net: -18.5 mL. No MIVF. GI Imaging: -9/8 CT Abd: small volume ascites Surgeries / Procedures:   Central access: Double lumen PICC (06/14/20) TPN start date: 06/17/20  Nutritional Goals (per RD recommendation on 9/10): kCal: 1530-1800, Protein: 65-80, Fluid: >/= 1.7 L/day  Goal TPN rate is 70 mL/hr (provides 79 g of protein and 1586 kcals per day)  Current Nutrition:  -Regular diet (some PO intake 9/11), TPN  Plan:   At 1800:  Increase TPN to goal rate of 70 mL/hr  Electrolytes in TPN: Decrease concentration of K  26mEq/L of Na, 63mEq/L of K, 71mEq/L of Ca, 26mEq/L of Mg, and 78mmol/L of Phos.   Cl:Ac ratio 2:1  Add standard MVI and trace elements to TPN  Continue Sensitive q6h SSI and adjust as needed   No MIVF ordered. Management per MD.  Monitor TPN labs on Mon/Thurs, check electrolytes with AM labs tomorrow  Lenis Noon, PharmD 06/19/2020,7:19 AM

## 2020-06-19 NOTE — Progress Notes (Signed)
PROGRESS NOTE    Peter Hayes  IPJ:[id number] DOB: 05-Oct-1996 DOA: 06/14/2020 PCP: Penni Bombard, PA  Brief Narrative:  24 year old Jehovah's Witness Advanced Duchenne muscular dystrophy general surgery-vent dependent on trilogy NIPPV for the past several months for most of the time Prior ileus in 2018 and then recurrent sigmoid volvulus status post endoscopic decompression last by Dr. Laurence Spates 01/10/2018--eventual open sigmoid colectomy with pouch 04/09/4192 complicated by leak requiring colostomy resulting in VDRF and prolonged hospital stay and has been on chronic TPN since At GI visit [facility] 01/21/2020 family did not wish to proceed with this but instead wish to continue with TPN Chronic respiratory failure on 1 L oxygen Iron deficiency anemia Chronic underlying sinus tachycardia  Admitted 06/14/2020 nausea without emesis subjective fever-has a chronic ostomy and a PICC line in place where he received TPN-reported TPN pump recently broke  Work-up = severe sepsis WBC 31 AST/ALT 183/165 bilirubin 2.6 lactic acid 1.1 CXR = right lung atelectasis RUQ ultrasound?  Underlying liver disease + steatosis without gallbladder pathology Rx empiric vancomycin cefepime Flagyl  9/8 developed hypotension?  Starvation ketosis-found to have multiorganism UTI Rx for 5 days Seen by critical care because of hypotension which was refractory initially found to have bilateral pleural effusions status post thoracentesis 9/10   Assessment & Plan:   Principal Problem:   Sepsis ([place]) Active Problems:   Muscular dystrophy ([place])   Hypoglycemia without diagnosis of diabetes mellitus   1. Severe sepsis on admission?  UTI?  Pneumonia a. Urine/blood cultures 9/7 -, ultrasound on admission 9/7 - for cholecystitis b. Had thoracentesis for right-sided effusion 9/10-no growth to date c. Continue ceftriaxone and complete after 10 days therapy 9/16-likely can narrow in a.m. if no fever d. CXR 9/12 repeat?   Pneumonia left side?  Scarring right side 2. Hypotension?  Starvation ketosis with deranged LFTs 3. Risk of refeeding syndrome a. Long discussion with father-patient has not been eating for several months properly and his appetite has diminished b. Patient's TPN pump malfunctioned on 06/13/2020 and he was now getting feeds c. Starvation ketosis improved-lab work Phos mag improved d. LFTs to be repeated in a.m. e. Discussed with family PEG tube placement and they will think about it 4. Right sided pleural effusion status post thoracentesis 9/10 5. Uses trilogy ventilator for most of the day a. Etiology unclear, total nucleated cells >1060, 98 lymphocytes argues against infection b. Patient has had this before as a source of dyspnea from cryptogenic causes c. Repeat x-ray 9/11-,  x-ray 9/12 shows no recurrence 6. Advanced Duchenne's muscular dystrophy usually on ventilator trilogy/1 L of oxygen at baseline a. Patient does not have neurologist used to follow in the past with [facility] relates that he was not satisfied with the care there b. We will reach out to [facility] to help coordinate care c. I have asked nursing to kindly sit him up completely and wean him to a nonrebreather or other device to see if we can help him tolerate more p.o. intake d. Patient may require goals of care in the next several days to discuss overall planning and outcomes 7. Chronic debility and muscular dystrophy requiring TPN for nutrition supplementation a. Patient cannot keep down enough calories p.o. and has not been able to do so for several months according to father b. Had TPN malfunction-back on feeding schedule 8. Prior iron deficiency anemia 9. Chronic underlying sinus tachycardia a. -Monitor  DVT prophylaxis: Lovenox Code Status: Full code Family Communication: Discussed with  his father at the bedside Disposition:   Status is: Inpatient  Remains inpatient appropriate because:Persistent severe  electrolyte disturbances, Ongoing active pain requiring inpatient pain management and IV treatments appropriate due to intensity of illness or inability to take PO   Dispo: The patient is from: Home              Anticipated d/c is to: Home              Anticipated d/c date is: 3 days              Patient currently is not medically stable to d/c.  Consultants:   None currently  Procedures: Multiple  Antimicrobials: Cefepime   Subjective: Verbalizes well Much more awake No pain but chronic and severe cough with some sputum He has no fever   Objective: Vitals:   06/19/20 1000 06/19/20 1100 06/19/20 1200 06/19/20 1227  BP: (!) 87/46 103/67 92/60   Pulse: 69 71 69   Resp: 11 17 (!) 27   Temp:    (!) 97.3 F (36.3 C)  TempSrc:    Axillary  SpO2: 100% 94% 100%   Weight:      Height:        Intake/Output Summary (Last 24 hours) at 06/19/2020 1529 Last data filed at 06/19/2020 1500 Gross per 24 hour  Intake 1060.54 ml  Output 1450 ml  Net -389.46 ml   Filed Weights   06/14/20 1158  Weight: 34 kg    Examination:  Debilitated no distress EOMI NCAT cannot appreciate mucosa as is on vent Neck soft supple S1-S2 slight tachycardia Patient has ostomy in the lower quadrant of abdomen with midline scar He has no reflexes in lower extremities and is quite cachectic he has no breakdown  Data Reviewed: I have personally reviewed following labs and imaging studies BUNs/creatinine 6/0.3 Calcium 8.6 Phosphorus 2.9 Magnesium 1.9 White count  3.8-->4.6 Platelet 113-->133   Radiology Studies: DG CHEST PORT 1 VIEW  Result Date: 06/19/2020 CLINICAL DATA:  Recent pneumonia EXAM: PORTABLE CHEST 1 VIEW COMPARISON:  June 18, 2020 and Feb 08, 2020; September 15, 2019 FINDINGS: There is persistent increased opacity in the right upper lobe region which may represent scarring given the persistence since May 2021. A degree of superimposed atelectasis and/or pneumonia in this area  cannot be excluded. There is ill-defined airspace opacity in the left base, unchanged from 1 day prior not present on May 2021 study. Suspect developing pneumonia in this area. Central catheter tip is in the right atrium slightly beyond the cavoatrial junction. No pneumothorax. Heart size and pulmonary vascularity are normal. No adenopathy. No bone lesions. IMPRESSION: 1.  Apparent developing pneumonia left base. 2. Stable opacity right upper lobe which may represent a degree of scarring. Superimposed atelectasis and/or infiltrate cannot be excluded. 3.  Central catheter as described without pneumothorax. 4.  Stable cardiac silhouette. Electronically Signed   By: Bretta Bang III M.D.   On: 06/19/2020 08:39   DG Chest Port 1 View  Result Date: 06/18/2020 CLINICAL DATA:  Per order- Acute respiratory failure EXAM: PORTABLE CHEST 1 VIEW COMPARISON:  06/14/2020 FINDINGS: PICC line unchanged. Normal cardiac silhouette. Lungs are hyperinflated. There is again demonstrated RIGHT upper lobe streaky opacity representing atelectasis or infiltrate. No worsening. Lung bases are clear. No pneumothorax. IMPRESSION: 1. Stable support apparatus. 2. Hyperinflated lungs. 3. Stable RIGHT upper lobe atelectasis or infiltrate. Electronically Signed   By: Genevive Bi M.D.   On: 06/18/2020 04:28  Scheduled Meds: . acetylcysteine  4 mL Nebulization BID  . chlorhexidine  15 mL Mouth Rinse BID  . Chlorhexidine Gluconate Cloth  6 each Topical Daily  . enoxaparin (LOVENOX) injection  30 mg Subcutaneous Q24H  . guaiFENesin  15 mL Oral Q6H  . insulin aspart  0-9 Units Subcutaneous Q6H  . magnesium oxide  400 mg Oral TID  . mouth rinse  15 mL Mouth Rinse q12n4p  . polyethylene glycol  17 g Oral Daily  . sodium chloride flush  10-40 mL Intracatheter Q12H  . sodium chloride flush  3 mL Intravenous Q12H   Continuous Infusions: . sodium chloride Stopped (06/17/20 0946)  . TPN ADULT (ION) 40 mL/hr at 06/19/20 1500    . TPN ADULT (ION)       LOS: 5 days    Time spent: 35 minutes  Nita Sells, MD [facility] To contact the attending provider between 7A-7P or the covering provider during after hours 7P-7A, please log into the web site [email or web address] and access using universal [place] password for that web site. If you do not have the password, please call the hospital operator.  06/19/2020, 3:29 PM

## 2020-06-20 LAB — CBC WITH DIFFERENTIAL/PLATELET
Abs Immature Granulocytes: 0.01 10*3/uL (ref 0.00–0.07)
Basophils Absolute: 0 10*3/uL (ref 0.0–0.1)
Basophils Relative: 0 %
Eosinophils Absolute: 0.2 10*3/uL (ref 0.0–0.5)
Eosinophils Relative: 4 %
HCT: 33.3 % — ABNORMAL LOW (ref 39.0–52.0)
Hemoglobin: 10.8 g/dL — ABNORMAL LOW (ref 13.0–17.0)
Immature Granulocytes: 0 %
Lymphocytes Relative: 38 %
Lymphs Abs: 1.9 10*3/uL (ref 0.7–4.0)
MCH: 26.5 pg (ref 26.0–34.0)
MCHC: 32.4 g/dL (ref 30.0–36.0)
MCV: 81.8 fL (ref 80.0–100.0)
Monocytes Absolute: 0.7 10*3/uL (ref 0.1–1.0)
Monocytes Relative: 14 %
Neutro Abs: 2.1 10*3/uL (ref 1.7–7.7)
Neutrophils Relative %: 44 %
Platelets: 123 10*3/uL — ABNORMAL LOW (ref 150–400)
RBC: 4.07 MIL/uL — ABNORMAL LOW (ref 4.22–5.81)
RDW: 15.2 % (ref 11.5–15.5)
WBC: 4.9 10*3/uL (ref 4.0–10.5)
nRBC: 0 % (ref 0.0–0.2)

## 2020-06-20 LAB — COMPREHENSIVE METABOLIC PANEL
ALT: 83 U/L — ABNORMAL HIGH (ref 0–44)
AST: 79 U/L — ABNORMAL HIGH (ref 15–41)
Albumin: 3.3 g/dL — ABNORMAL LOW (ref 3.5–5.0)
Alkaline Phosphatase: 92 U/L (ref 38–126)
Anion gap: 8 (ref 5–15)
BUN: 12 mg/dL (ref 6–20)
CO2: 26 mmol/L (ref 22–32)
Calcium: 8.6 mg/dL — ABNORMAL LOW (ref 8.9–10.3)
Chloride: 101 mmol/L (ref 98–111)
Creatinine, Ser: <0.3 mg/dL — ABNORMAL LOW (ref 0.61–1.24)
Glucose, Bld: 101 mg/dL — ABNORMAL HIGH (ref 70–99)
Potassium: 3.9 mmol/L (ref 3.5–5.1)
Sodium: 135 mmol/L (ref 135–145)
Total Bilirubin: 0.8 mg/dL (ref 0.3–1.2)
Total Protein: 5.9 g/dL — ABNORMAL LOW (ref 6.5–8.1)

## 2020-06-20 LAB — GLUCOSE, CAPILLARY
Glucose-Capillary: 115 mg/dL — ABNORMAL HIGH (ref 70–99)
Glucose-Capillary: 105 mg/dL — ABNORMAL HIGH (ref 70–99)
Glucose-Capillary: 110 mg/dL — ABNORMAL HIGH (ref 70–99)

## 2020-06-20 LAB — TRIGLYCERIDES: Triglycerides: 69 mg/dL (ref <150)

## 2020-06-20 LAB — PHOSPHORUS: Phosphorus: 3.2 mg/dL (ref 2.5–4.6)

## 2020-06-20 LAB — CYTOLOGY - NON PAP

## 2020-06-20 LAB — MAGNESIUM: Magnesium: 1.6 mg/dL — ABNORMAL LOW (ref 1.7–2.4)

## 2020-06-20 LAB — PREALBUMIN: Prealbumin: 13 mg/dL — ABNORMAL LOW (ref 18–38)

## 2020-06-20 MED ORDER — MAGNESIUM SULFATE 4 GM/100ML IV SOLN
4.0000 g | Freq: Once | INTRAVENOUS | Status: AC
  Administered 2020-06-20: 4 g via INTRAVENOUS
  Filled 2020-06-20: qty 100

## 2020-06-20 MED ORDER — LEVOFLOXACIN 500 MG PO TABS
500.0000 mg | ORAL_TABLET | Freq: Every day | ORAL | Status: DC
  Administered 2020-06-20 – 2020-06-21 (×2): 500 mg via ORAL
  Filled 2020-06-20 (×2): qty 1

## 2020-06-20 MED ORDER — TRAVASOL 10 % IV SOLN
INTRAVENOUS | Status: DC
  Filled 2020-06-20: qty 789.6

## 2020-06-20 MED ORDER — SCOPOLAMINE 1 MG/3DAYS TD PT72
1.0000 | MEDICATED_PATCH | TRANSDERMAL | Status: DC
  Administered 2020-06-20: 1.5 mg via TRANSDERMAL
  Filled 2020-06-20: qty 1

## 2020-06-20 NOTE — Progress Notes (Signed)
PROGRESS NOTE    Peter Hayes  IRS:[id number] DOB: 12-17-1995 DOA: 06/14/2020 PCP: Penni Bombard, PA  Brief Narrative:  24 year old  Advanced Duchenne muscular dystrophy general surgery-vent dependent on trilogy NIPPV for the past several months for most of the time--- prior to this was only using this at night's Prior ileus in 2018 + sigmoid volvulus + endoscopic decompression [facility]--eventual open sigmoid colectomy with pouch 0/0/9381 complicated by leak requiring colostomy resulting in VDRF and prolonged hospital stay and has been on chronic TPN since At GI visit [facility] 01/21/2020 family did not wish to proceed with this but instead wish to continue with TPN--he was also seen in the past by Dr. Justus Memory at [facility] general surgery for consideration of feeding tube 01/21/2020   Patient was admitted [phone] with pneumomediastinum which was small and he    was discharged home Chronic respiratory failure on 1 L oxygen Iron deficiency anemia Chronic underlying sinus tachycardia  Admitted 06/14/2020 nausea without emesis subjective fever-has a chronic ostomy and a PICC line in place where he received TPN-reported TPN pump recently broke  Work-up = severe sepsis WBC 31 AST/ALT 183/165 bilirubin 2.6 lactic acid 1.1 CXR = right lung atelectasis RUQ ultrasound?  Underlying liver disease + steatosis without gallbladder pathology Rx empiric vancomycin cefepime Flagyl no source was found--- he did on 9/10 have increasing bilateral pleural effusions right greater than left and was possibly secondary to fluid resuscitation for his sepsis and this was tapped which also did not seem to be infected   since the tap And since he has been on antibiotics he is greatly improved   Assessment & Plan:   Principal Problem:   Sepsis ([place]) Active Problems:   Muscular dystrophy ([place])   Hypoglycemia without diagnosis of diabetes mellitus   1. Severe sepsis on admission?  UTI?   Pneumonia a. Urine/blood cultures 9/7 -, ultrasound on admission 9/7 - for cholecystitis b. Had thoracentesis for right-sided effusion 9/10-no growth to date c. Patient transitioning from ceftriaxone monotherapy to Levaquin 9/12 will need to complete 2 weeks of antibiotics ending on 06/27/20 d. CXR 9/12 to my overview shows may be atelectasis versus pneumonia which would be covered by Levaquin anyway 2. Hypotension?  Starvation ketosis with deranged LFTs 3. Risk of refeeding syndrome a. Patient's TPN pump malfunctioned on 06/13/2020--it is working now and he is able to get his feeds b. Starvation ketosis improved-lab work Phos mag improved c. LFTs are slightly up 4. Right sided pleural effusion status post thoracentesis 9/10 5. Uses trilogy ventilator for most of the day a. Etiology unclear, total nucleated cells >1060, 98 lymphocytes argues against infection b. Patient has had this before as a source of dyspnea from cryptogenic causes c. Repeat x-ray 9/11-,  x-ray 9/12 shows no recurrence 6. Advanced Duchenne's muscular dystrophy usually on ventilator trilogy/1 L of oxygen at baseline a. Patient does not have neurologist used to follow in the past with [facility] relates that he was not satisfied with the care there b. Patient will need coordination with outpatient physicians at [facility] and [facility] regarding multiple questions as below c. We added scopolamine patch to help dry up his secretions and he was able to use nasal cannula today and sit up in the chair d. We will discharge him on this 7. Chronic debility and muscular dystrophy requiring TPN for nutrition supplementation a. Patient cannot keep down enough calories p.o. and has not been able to do so for several  months according to father b. Had TPN malfunction-back on feeding schedule 8. Prior iron deficiency anemia 9. Chronic underlying sinus tachycardia a. -Monitor  DVT prophylaxis: Lovenox Code Status:  Full code Family Communication: Discussed with his father at the bedside and had a long discussion with the patient today Specifically I discussed the risk of TPN long-term the risk of picklines and possible solution could be feeding tube?  Jejunostomy/PEG tube Patient discussed with me today that he was interested in a tracheotomy I do think that he will need to stabilize over the next several days to weeks and wean off the trilogy vent to be able to then more meaningfully have these discussions at a tertiary care center He is well-known to [facility] and I have asked that the father coordinate through [facility] and PCP these discussions going forward I think that he may be stable as early as 9/14 for discharge home with family I have spoken with pharmacist and TOC to arrange his feeds as he typically gets his feeds at night and was transitioned to continuous feeds because he was ill previously Disposition:   Status is: Inpatient  Remains inpatient appropriate because:Persistent severe electrolyte disturbances, Ongoing active pain requiring inpatient pain management and IV treatments appropriate due to intensity of illness or inability to take PO   Dispo: The patient is from: Home              Anticipated d/c is to: Home              Anticipated d/c date is: 1 day              Patient currently is not medically stable to d/c.  Consultants:   None currently  Procedures: Multiple  Antimicrobials: Cefepime   Subjective:  Awake coherent no distress no new issues Was able to come off the ventilator for about an hour and stay on just 2 L of oxygen His father is at the bedside and we had some detailed discussions about planning for the next several days   Objective: Vitals:   06/20/20 0825 06/20/20 1200 06/20/20 1600 06/20/20 1604  BP:  (!) 97/44 (!) 144/85   Pulse:  78 97   Resp:  (!) 21 (!) 25   Temp:  (!) 96.3 F (35.7 C)    TempSrc:  Axillary    SpO2: 99% 98%  100% 100%  Weight:      Height:        Intake/Output Summary (Last 24 hours) at 06/20/2020 1640 Last data filed at 06/20/2020 1235 Gross per 24 hour  Intake 1104.35 ml  Output 600 ml  Net 504.35 ml   Filed Weights   06/14/20 1158  Weight: 34 kg    Examination:  Debilitated no distress EOMI NCAT cannot appreciate mucosa as is on vent Neck soft supple S1-S2 tachycardic in the 100s Patient has ostomy in the lower quadrant of abdomen with midline scar He has no reflexes in lower extremities cachectic  Data Reviewed: I have personally reviewed following labs and imaging studies BUNs/creatinine 6/0.3-->12/less than 0.3 Calcium 8.6 Phosphorus 3.2 Magnesium 1.6 and being replaced in TPN AST/ALT slightly up from 28-->79 ALT up from 68->83 White count  3.8-->4.6 Platelet 113-->133 Prealbumin is up from 7-13 White count is 4.9 Platelets of 123   Radiology Studies: DG CHEST PORT 1 VIEW  Result Date: 06/19/2020 CLINICAL DATA:  Recent pneumonia EXAM: PORTABLE CHEST 1 VIEW COMPARISON:  June 18, 2020 and Feb 08, 2020;  September 15, 2019 FINDINGS: There is persistent increased opacity in the right upper lobe region which may represent scarring given the persistence since May 2021. A degree of superimposed atelectasis and/or pneumonia in this area cannot be excluded. There is ill-defined airspace opacity in the left base, unchanged from 1 day prior not present on May 2021 study. Suspect developing pneumonia in this area. Central catheter tip is in the right atrium slightly beyond the cavoatrial junction. No pneumothorax. Heart size and pulmonary vascularity are normal. No adenopathy. No bone lesions. IMPRESSION: 1.  Apparent developing pneumonia left base. 2. Stable opacity right upper lobe which may represent a degree of scarring. Superimposed atelectasis and/or infiltrate cannot be excluded. 3.  Central catheter as described without pneumothorax. 4.  Stable cardiac silhouette.  Electronically Signed   By: Lowella Grip III M.D.   On: 06/19/2020 08:39     Scheduled Meds: . acetylcysteine  4 mL Nebulization BID  . chlorhexidine  15 mL Mouth Rinse BID  . Chlorhexidine Gluconate Cloth  6 each Topical Daily  . enoxaparin (LOVENOX) injection  30 mg Subcutaneous Q24H  . guaiFENesin  15 mL Oral Q6H  . insulin aspart  0-9 Units Subcutaneous Q6H  . levofloxacin  500 mg Oral Daily  . magnesium oxide  400 mg Oral TID  . mouth rinse  15 mL Mouth Rinse q12n4p  . polyethylene glycol  17 g Oral Daily  . scopolamine  1 patch Transdermal Q72H  . sodium chloride flush  10-40 mL Intracatheter Q12H  . sodium chloride flush  3 mL Intravenous Q12H   Continuous Infusions: . sodium chloride 250 mL (06/19/20 2136)  . TPN ADULT (ION) 70 mL/hr at 06/20/20 0600  . TPN ADULT (ION)       LOS: 6 days    Time spent: 25 minutes  Nita Sells, MD [facility] To contact the attending provider between 7A-7P or the covering provider during after hours 7P-7A, please log into the web site [email or web address] and access using universal [place] password for that web site. If you do not have the password, please call the hospital operator.  06/20/2020, 4:40 PM

## 2020-06-20 NOTE — TOC Progression Note (Signed)
Transition of Care [facility]) - Progression Note    Patient Details  Name: Peter Hayes MRN: [id number] Date of Birth: 07-19-1996  Transition of Care [facility]) CM/SW Contact  Purcell Mouton, RN Phone Number: 06/20/2020, 4:29 PM  Clinical Narrative:    Pt is active with [facility] and will need orders to "Resume home TPN per [facility] protocol"    Expected Discharge Plan: Home/Self Care Barriers to Discharge: Continued Medical Work up  Expected Discharge Plan and Services Expected Discharge Plan: Home/Self Care   Discharge Planning Services: CM Consult   Living arrangements for the past 2 months: Single Family Home                                       Social Determinants of Health (SDOH) Interventions    Readmission Risk Interventions No flowsheet data found.

## 2020-06-20 NOTE — Progress Notes (Signed)
PHARMACY - TOTAL PARENTERAL NUTRITION CONSULT NOTE   Indication: Malnutrition, pt chronically on TPN   Patient Measurements: Height: 5\' 2"  (157.5 cm) Weight: 34 kg (74 lb 15.3 oz) IBW/kg (Calculated) : 54.6 TPN AdjBW (KG): 34 Body mass index is 13.71 kg/m.  Assessment: Pt is a 18 yoM with PMH significant for advanced muscular dystrophy, severe protein calorie malnutrition on chronic TPN, chronic respiratory failure. Pt had TPN pump failure PTA. Pharmacy consulted to resume TPN on 9/10 (was held 9/7 - 9/9).  Glucose / Insulin: No hx DM. BG 111-140 is WNL. Goal BG 100-150. 1 unit SSI/24 hours. Electrolytes: WNL including CorrCa (9.16), except Mag 1.6.  Na down to 135. Renal: SCr <0.3, BUN 12 LFTs / TGs: LFTs mildly elevated (AST up to 79, ALT up to 83); TG WNL (69) Prealbumin / albumin: 13/3.3 - both low, increasing Intake / Output; MIVF: Adequate UOP 1275 mL (1.6 ml/kg/h). I/O Net: +299 mL. No MIVF. GI Imaging: -9/8 CT Abd: small volume ascites Surgeries / Procedures:  Medications:  MagOx 400 TID  Central access: Double lumen PICC (06/14/20) TPN start date: 06/17/20  Nutritional Goals (per RD recommendation on 9/10): kCal: 1530-1800, Protein: 65-80, Fluid: >/= 1.7 L/day Goal TPN rate is 70 mL/hr (provides 79 g of protein and 1586 kcals per day)  Current Nutrition:  -Regular diet (no PO intake recorded), TPN  Plan:  Now:  Magnesium 4g IV bolus x1 At 1800:  Continue TPN at goal rate of 70 mL/hr  Electrolytes in TPN:   88mEq/L of Na, 44mEq/L of K, 82mEq/L of Ca, increase to 7.5 mEq/L of Mg, and 21mmol/L of Phos.   Cl:Ac ratio 2:1  Add standard MVI and trace elements to TPN  Continue Sensitive q6h SSI and adjust as needed   No MIVF ordered. Management per MD.  Monitor TPN labs on Mon/Thurs, check electrolytes with AM labs tomorrow  Gretta Arab PharmD, BCPS Clinical Pharmacist [facility] main pharmacy [phone] 06/20/2020 9:10 AM

## 2020-06-21 LAB — CBC WITH DIFFERENTIAL/PLATELET
Abs Immature Granulocytes: 0.01 10*3/uL (ref 0.00–0.07)
Basophils Absolute: 0 10*3/uL (ref 0.0–0.1)
Basophils Relative: 0 %
Eosinophils Absolute: 0.1 10*3/uL (ref 0.0–0.5)
Eosinophils Relative: 2 %
HCT: 31.2 % — ABNORMAL LOW (ref 39.0–52.0)
Hemoglobin: 10.3 g/dL — ABNORMAL LOW (ref 13.0–17.0)
Immature Granulocytes: 0 %
Lymphocytes Relative: 32 %
Lymphs Abs: 1.7 10*3/uL (ref 0.7–4.0)
MCH: 27.1 pg (ref 26.0–34.0)
MCHC: 33 g/dL (ref 30.0–36.0)
MCV: 82.1 fL (ref 80.0–100.0)
Monocytes Absolute: 0.7 10*3/uL (ref 0.1–1.0)
Monocytes Relative: 12 %
Neutro Abs: 2.9 10*3/uL (ref 1.7–7.7)
Neutrophils Relative %: 54 %
Platelets: 136 10*3/uL — ABNORMAL LOW (ref 150–400)
RBC: 3.8 MIL/uL — ABNORMAL LOW (ref 4.22–5.81)
RDW: 15.3 % (ref 11.5–15.5)
WBC: 5.5 10*3/uL (ref 4.0–10.5)
nRBC: 0 % (ref 0.0–0.2)

## 2020-06-21 LAB — COMPREHENSIVE METABOLIC PANEL
ALT: 91 U/L — ABNORMAL HIGH (ref 0–44)
AST: 78 U/L — ABNORMAL HIGH (ref 15–41)
Albumin: 3.5 g/dL (ref 3.5–5.0)
Alkaline Phosphatase: 92 U/L (ref 38–126)
Anion gap: 9 (ref 5–15)
BUN: 13 mg/dL (ref 6–20)
CO2: 25 mmol/L (ref 22–32)
Calcium: 8.5 mg/dL — ABNORMAL LOW (ref 8.9–10.3)
Chloride: 101 mmol/L (ref 98–111)
Creatinine, Ser: <0.3 mg/dL — ABNORMAL LOW (ref 0.61–1.24)
Glucose, Bld: 95 mg/dL (ref 70–99)
Potassium: 4 mmol/L (ref 3.5–5.1)
Sodium: 135 mmol/L (ref 135–145)
Total Bilirubin: 0.6 mg/dL (ref 0.3–1.2)
Total Protein: 6.3 g/dL — ABNORMAL LOW (ref 6.5–8.1)

## 2020-06-21 LAB — GLUCOSE, CAPILLARY
Glucose-Capillary: 103 mg/dL — ABNORMAL HIGH (ref 70–99)
Glucose-Capillary: 108 mg/dL — ABNORMAL HIGH (ref 70–99)
Glucose-Capillary: 99 mg/dL (ref 70–99)

## 2020-06-21 LAB — BODY FLUID CULTURE: Culture: NO GROWTH

## 2020-06-21 LAB — PHOSPHORUS: Phosphorus: 3.2 mg/dL (ref 2.5–4.6)

## 2020-06-21 LAB — MAGNESIUM: Magnesium: 2 mg/dL (ref 1.7–2.4)

## 2020-06-21 MED ORDER — ENSURE ENLIVE PO LIQD: ORAL | 12 refills | Status: DC

## 2020-06-21 MED ORDER — SCOPOLAMINE 1 MG/3DAYS TD PT72: 1.0000 | MEDICATED_PATCH | TRANSDERMAL | 12 refills | Status: DC

## 2020-06-21 MED ORDER — LEVOFLOXACIN 500 MG PO TABS
500.0000 mg | ORAL_TABLET | Freq: Every day | ORAL | 0 refills | Status: DC
Start: 2020-06-21 — End: 2020-08-20

## 2020-06-21 MED ORDER — TRAVASOL 10 % IV SOLN
INTRAVENOUS | Status: DC
  Filled 2020-06-21: qty 789.6

## 2020-06-21 MED ORDER — GUAIFENESIN 100 MG/5ML PO SOLN: 15.0000 mL | Freq: Four times a day (QID) | ORAL | 0 refills | Status: DC

## 2020-06-21 MED ORDER — ONDANSETRON HCL 4 MG PO TABS: 4.0000 mg | ORAL_TABLET | Freq: Four times a day (QID) | ORAL | 0 refills | Status: AC | PRN

## 2020-06-21 NOTE — Progress Notes (Signed)
Discharge instructions discussed with Father, Lona Millard. All belongings returned to patient.

## 2020-06-21 NOTE — Progress Notes (Addendum)
PHARMACY - TOTAL PARENTERAL NUTRITION CONSULT NOTE   Indication: Malnutrition, pt chronically on TPN   Patient Measurements: Height: 5\' 2"  (157.5 cm) Weight: 34 kg (74 lb 15.3 oz) IBW/kg (Calculated) : 54.6 TPN AdjBW (KG): 34 Body mass index is 13.71 kg/m.  Assessment: Pt is a 72 yoM with PMH significant for advanced muscular dystrophy, severe protein calorie malnutrition on chronic TPN, chronic respiratory failure. Pt had TPN pump failure PTA. Pharmacy consulted to resume TPN on 9/10 (was held 9/7 - 9/9).  Glucose / Insulin: No hx DM. BG 99-115 is WNL. Goal BG 100-150.  Electrolytes: WNL including CorrCa (9.16).  Na low/normal at 135.  Renal: SCr <0.3, BUN 13 LFTs / TGs: LFTs mildly elevated (AST 78, ALT 91); TG WNL (69) Prealbumin / albumin: 13/3.5 - both low, increasing Intake / Output; MIVF: Adequate UOP (volume not charted x2 occurrences).  Net I/O unreliable.  No MIVF. GI Imaging: -9/8 CT Abd: small volume ascites Surgeries / Procedures:  Medications:  MagOx 400 TID  Central access: Double lumen PICC (06/14/20) TPN start date: 06/17/20  Nutritional Goals (per RD recommendation on 9/10): kCal: 1530-1800, Protein: 65-80, Fluid: >/= 1.7 L/day Goal TPN rate is 70 mL/hr (provides 79 g of protein and 1586 kcals per day)  Current Nutrition:  -Regular diet (no PO intake recorded), TPN  Plan:  At 1800:  Continue TPN at goal rate of 70 mL/hr  Electrolytes in TPN:   60mEq/L of Na, 78mEq/L of K, 89mEq/L of Ca, 7.5 mEq/L of Mg, and 58mmol/L of Phos.   Cl:Ac ratio 2:1  Add standard MVI and trace elements to TPN  Continue Sensitive q6h SSI and adjust as needed   No MIVF ordered. Management per MD.  Monitor TPN labs on Mon/Thurs, check electrolytes with AM labs tomorrow  Gretta Arab PharmD, BCPS Clinical Pharmacist [facility] main pharmacy [phone] 06/21/2020 8:06 AM    Addendum:  Planning for discharge today with home TPN to be delivered tonight.  Gretta Arab PharmD,  BCPS Clinical Pharmacist [facility] pharmacy [phone] 06/21/2020 12:26 PM

## 2020-06-21 NOTE — Discharge Summary (Signed)
Physician Discharge Summary  Peter Hayes RXV:[id number] DOB: 12/04/1995 DOA: 06/14/2020  PCP: Penni Bombard, PA  Admit date: 06/14/2020 Discharge date: 06/21/2020  Time spent: 40 minutes  Recommendations for Outpatient Follow-up:  1. Scopolamine new medication for secretions etc. etc. 2. Needs CBC and Chem-12 1 week 3. Need coordination of care with [facility] and [facility] once much more stable with regards to questions of possibly getting a jejunostomy which is a feeding jejunostomy and or a tracheotomy-he is not stable enough at this time but is stabilized to the point where if he can down titrate to regular oxygen over the next several weeks this could be a possibility 4. Will need neurology input as an outpatient-has not seen 1 in several years and he may have progressive muscular dystrophy-do not know of options that we would consider if this is progressive and may need to have a discussion with palliative care if there are none after he sees someone 5. Will need also repeat chest x-ray in 2 weeks to do note clearing of atelectasis etc.-it was felt unlikely he had pneumonia however his antibiotics were prolonged to some degree to ensure that he had clearance of bacterial flora and he is high risk for aspiration given being on the trilogy vent at all times  Discharge Diagnoses:  Principal Problem:   Sepsis ([place]) Active Problems:   Muscular dystrophy ([place])   Hypoglycemia without diagnosis of diabetes mellitus   Discharge Condition: Fair  Diet recommendation: Regular with TPN supplementation  Filed Weights   06/14/20 1158  Weight: 34 kg    History of present illness:  24 year old  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 Justus Memory at [facility] general surgery for consideration of feeding tube 01/21/2020                         Patient was admitted [phone] with pneumomediastinum which was small and he was discharged home At baseline prior to several months-chronic respiratory failure on 1 L oxygen only Iron deficiency anemia Chronic underlying sinus tachycardia  Admitted 06/14/2020 nausea without emesis subjective fever-has a chronic ostomy and a PICC line in place where he received TPN-reported TPN pump recently broke  Work-up = severe sepsis WBC 31 AST/ALT 183/165 bilirubin 2.6 lactic acid 1.1 CXR = right lung atelectasis RUQ ultrasound?  Underlying liver disease + steatosis without gallbladder pathology Rx empiric vancomycin cefepime Flagyl no source was found--- he did on 9/10 have increasing bilateral pleural effusions right greater than left and was possibly secondary to fluid resuscitation for his sepsis and this was tapped which also did not seem to be infected   since the tap And since he has been on antibiotics he is greatly improved  Hospital Course:  1. Severe sepsis on admission?  UTI?  Pneumonia a. Urine/blood cultures 9/7 -, ultrasound on admission 9/7 - for cholecystitis b. Had thoracentesis for right-sided effusion 9/10-no growth to date c. Patient  transitioning from ceftriaxone monotherapy to Levaquin 9/12 which we initially thought would be needed for 14 days however after limited to only 10 days because his fluid culture is negative on thoracentesis as above d. CXR 9/12 to my overview shows may be atelectasis versus pneumonia which would be covered by Levaquin anyway e. He will need an  chest x-ray in about 1 week to 2 weeks 2. Hypotension?  Starvation ketosis with deranged LFTs 3. Risk of refeeding syndrome a. Patient's TPN pump malfunctioned on 06/13/2020--it is working now and he is able to get his feeds b. Starvation ketosis improved-lab work Phos mag improved and will get replacement and TPN per AHI input c. LFTs are slightly up and will need careful monitoring and consideration for different types of feeds 4. Right sided pleural effusion status post thoracentesis 9/10 5. Uses trilogy ventilator for most of the day a. Etiology unclear, total nucleated cells >1060, 98 lymphocytes argues against infection b. Patient has had this before as a source of dyspnea from cryptogenic causes c. Repeat x-ray 9/11-,  x-ray 9/12 shows no recurrence d. Will need repeat chest x-ray as above 6. Advanced Duchenne's muscular dystrophy usually on ventilator trilogy/1 L of oxygen at baseline a. Patient does not have neurologist used to follow in the past with [facility] relates that he was not satisfied with the care there b. Patient will need coordination with outpatient physicians at [facility] and [facility] regarding multiple questions as below c. Scopolamine patch helped him to a great degree clear up his secretions I have cautioned about side effects on this and patient has been given a prescription for the same 7. Chronic debility and muscular dystrophy requiring TPN for nutrition supplementation a. Patient cannot keep down enough calories p.o. and has not been able to do so for several months according to father b. Had TPN malfunction-back on feeding schedule 8. Prior iron deficiency anemia 9. Chronic underlying sinus tachycardia a. -Monitor  DVT prophylaxis: Lovenox Code Status: Full code Family Communication: Discussed with his father at the bedside and had a long discussion with the patient over the past several days Patient and family are interested in  potentially hearing again about jejunostomy and patient himself tells me he would want to consider tracheotomy They are aware that he needs to be much more stable and back on home oxygen prior to discussions of the same They are aware that TPN long-term is not a solution for additional nutrition and will follow up with [facility] medical   Procedures:  Multiple (i.e. Studies not automatically included, echos, thoracentesis, etc; not x-rays)  Consultations:  None  Discharge Exam: Vitals:   06/21/20 0800 06/21/20 0930  BP: (!) 110/52   Pulse: 72   Resp: (!) 29   Temp: (!) 97.5 F (36.4 C)   SpO2: 100% 100%    General: Awake coherent pleasant no distress EOMI NCAT sitting up in chair was off of the trilogy vent for about 15 to 20 minutes today Cardiovascular: Mild tachycardia Respiratory: Clear no added sound no rales no rhonchi no wheeze Abdomen soft he does have an ostomy in place postoperative changes noted He is unable to raise his legs or arms with grade 0 power He has no reported decubitus although I did not evaluate his sacrum He is quite cachectic  Discharge Instructions   Discharge Instructions    Diet - low sodium heart healthy   Complete by: As directed    Discharge instructions   Complete  by: As directed    Follow the medications recommendations carefully you will need several more days of levofloxacin which is an antibiotic which we will treat the infection that you had on admission-we were unable to find a source for this as we have discussed Please note that there are changes to your medications and that you will need to discuss once you are improved on 2 to 3 L of oxygen I discussion with your regular physicians and revisiting [facility] general surgery Dr. Justus Memory who saw you previously for possibility of a tube into your stomach that will help you get feeds outside of the TPN As discussed with you TPN is a reasonable short-term bridge to getting longer-term  nutrition parenterally-it is not necessarily a good thing long-term You discussed with me a tracheotomy and although I think that may be an option, remember that this is something that will need to be taken care of so I would prefer that when you are a lot more stable and you feel better this discussion can be had with your primary physician to coordinate these things in the outpatient setting You will need labs in about 1 week or 2 weeks at your primary care office at your primary care office and we will arrange for the TPN and home health to be resumed   Face-to-face encounter (required for Medicare/Medicaid patients)   Complete by: As directed    I Nita Sells certify that this patient is under my care and that I, or a nurse practitioner or physician's assistant working with me, had a face-to-face encounter that meets the physician face-to-face encounter requirements with this patient on 06/21/2020. The encounter with the patient was in whole, or in part for the following medical condition(s) which is the primary reason for home health care (List medical condition):   Progressive Duchenne's muscular dystrophy needing full conference of care Also will need TPN to be resumed "Resume home TPN per AHI protocol"  Please contact his primary physician at [facility] to address the TPN rates as needed   The encounter with the patient was in whole, or in part, for the following medical condition, which is the primary reason for home health care: Patient has Duchenne's muscular dystrophy and will need as much help as he can get   I certify that, based on my findings, the following services are medically necessary home health services:  Physical therapy Nursing Speech language pathology     Reason for Medically Necessary Home Health Services: Skilled Nursing- Skilled Assessment/Observation   My clinical findings support the need for the above services: Bedbound   Further, I certify that  my clinical findings support that this patient is homebound due to: Can transfer bed to chair only   Home Health   Complete by: As directed    To provide the following care/treatments:  PT OT SLP RN Home Health Aide     Increase activity slowly   Complete by: As directed      Allergies as of 06/21/2020      Reactions   Fentanyl Other (See Comments)   Dizziness      Medication List    STOP taking these medications   benzonatate 100 MG capsule Commonly known as: TESSALON   metoprolol tartrate 25 MG tablet Commonly known as: LOPRESSOR   multivitamin with minerals Tabs tablet   simethicone 80 MG chewable tablet Commonly known as: Gas-X     TAKE these medications   acetaminophen 325  MG tablet Commonly known as: TYLENOL Take 2 tablets (650 mg total) by mouth every 6 (six) hours as needed for mild pain, moderate pain, fever or headache (or temp > 100).   feeding supplement (ENSURE ENLIVE) Liqd You can use whatever supplement he likes.  He needs about 1300 calories per day. 1.5 liters of fluid and 50 grams of protein per day. You can buy this at the grocery or drug store.  You do not need a prescription.   ferrous sulfate 220 (44 Fe) MG/5ML solution Take 220 mg by mouth 3 (three) times daily with meals.   guaiFENesin 100 MG/5ML Soln Commonly known as: ROBITUSSIN Take 15 mLs (300 mg total) by mouth every 6 (six) hours.   ipratropium-albuterol 0.5-2.5 (3) MG/3ML Soln Commonly known as: DUONEB Take 3 mLs by nebulization every 6 (six) hours as needed. What changed: reasons to take this   levofloxacin 500 MG tablet Commonly known as: LEVAQUIN Take 1 tablet (500 mg total) by mouth daily.   magnesium oxide 400 MG tablet Commonly known as: MAG-OX Take 400 mg by mouth 3 (three) times daily.   ondansetron 4 MG tablet Commonly known as: ZOFRAN Take 1 tablet (4 mg total) by mouth every 6 (six) hours as needed for nausea.   polyethylene glycol 17 g packet Commonly known  as: MIRALAX / GLYCOLAX Follow package instruction for daily use.  You need to have one soft bowel movement per day.  If he is not doing this call your primary care doctor.  You also need to be sure he takes in 1.5 liters of fluid per day.   He needs protein supplement daily, and needs to take in about 1300 calories per day. What changed:   how much to take  how to take this  when to take this   scopolamine 1 MG/3DAYS Commonly known as: TRANSDERM-SCOP Place 1 patch (1.5 mg total) onto the skin every 3 (three) days. Start taking on: June 23, 2020   sterile water SOLN with amino acids 10 % SOLN 1.3 g/kg, dextrose 70 % SOLN 20 % Inject 1,000 mLs into the vein continuous. TPN 1000 ml w/ lipids  Directions: Infuse TPN 1039ml IV through PICC line via CADD SOLIS infusion pump daily over 18 hours with 1 hr ramp up and 1 hr ramp down. Add MVI 10 ml as directed to each bag prior to infusion. MIX WELL VTBI: 1000 ML Bag Volume  1050 ml     Dosing Weight for the patient 72 lbs      Allergies  Allergen Reactions  . Fentanyl Other (See Comments)    Dizziness      The results of significant diagnostics from this hospitalization (including imaging, microbiology, ancillary and laboratory) are listed below for reference.    Significant Diagnostic Studies: CT ABDOMEN PELVIS W CONTRAST  Result Date: 06/15/2020 CLINICAL DATA:  Acute nonlocalized abdominal pain, nausea and increased work of breathing, severe sepsis of uncertain source, history muscular dystrophy, on TPN as well as NIPPV at night EXAM: CT ABDOMEN AND PELVIS WITH CONTRAST TECHNIQUE: Multidetector CT imaging of the abdomen and pelvis was performed using the standard protocol following bolus administration of intravenous contrast. Sagittal and coronal MPR images reconstructed from axial data set. CONTRAST:  131mL OMNIPAQUE IOHEXOL 300 MG/ML  SOLN IV.  At COMPARISON:  11/15/2019 FINDINGS: Lower chest: Moderate RIGHT pleural effusion and  basilar atelectasis. Minimal LEFT pleural effusion. Hepatobiliary: Fatty infiltration of liver. Slight gallbladder wall thickening, nonspecific in the setting of  ascites. Liver otherwise unremarkable. Pancreas: Normal appearance Spleen: Normal appearance.  Small splenule at splenic hilum. Adrenals/Urinary Tract: Adrenal glands, kidneys, ureters, and bladder normal appearance Stomach/Bowel: Cecum in LEFT upper pelvis. Appendix not visualized but no pericecal inflammatory process seen. 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 Stomach decompressed. Small bowel loops normal appearance. Vascular/Lymphatic: Vascular structures patent. Aorta normal caliber. No adenopathy. Reproductive: Unremarkable prostate gland Other: Small volume ascites.  No free air or hernia. Musculoskeletal: Marked muscular atrophy. Osseous demineralization with severe thoracolumbar scoliosis. IMPRESSION: Fatty infiltration of liver. Small volume ascites of uncertain etiology. Moderate RIGHT and minimal LEFT pleural effusions with bibasilar atelectasis, new. 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 No other acute intra-abdominal or intrapelvic abnormalities. Electronically Signed   By: Lavonia Dana M.D.   On: 06/15/2020 13:08   US Abdomen Limited  Result Date: 06/14/2020 CLINICAL DATA:  Elevated liver enzymes EXAM: ULTRASOUND ABDOMEN LIMITED RIGHT UPPER QUADRANT COMPARISON:  None. FINDINGS: Gallbladder: Gallbladder assessment is somewhat limited due to positioning limits given underlying muscular dystrophy. No gallstones appreciable. No gallbladder wall thickening or pericholecystic fluid. No sonographic Murphy sign noted by sonographer. Common bile duct: Diameter: 3 mm. No intrahepatic or extrahepatic biliary duct dilatation. Liver: No focal lesion identified. Liver echogenicity overall is increased with a somewhat coarsened echotexture. Portal vein is patent on color Doppler imaging with  normal direction of blood flow towards the liver. Other: Right pleural effusion evident. IMPRESSION: 1. The liver echogenicity is increased and somewhat coarsened. Suspect underlying parenchymal liver disease with likely superimposed hepatic steatosis. No focal liver lesions are evident; it must be cautioned that the sensitivity of ultrasound for detection of focal liver lesions is somewhat limited in this circumstance. 2. No gallbladder pathology evident with limitations in gallbladder visualization given difficulties with patient positioning. If there remains concern for potential gallbladder pathology, it may be reasonable to consider nuclear medicine hepatobiliary imaging study to assess for cystic duct patency. 3.  Right pleural effusion. Electronically Signed   By: Lowella Grip III M.D.   On: 06/14/2020 15:13   DG CHEST PORT 1 VIEW  Result Date: 06/19/2020 CLINICAL DATA:  Recent pneumonia EXAM: PORTABLE CHEST 1 VIEW COMPARISON:  June 18, 2020 and Feb 08, 2020; September 15, 2019 FINDINGS: There is persistent increased opacity in the right upper lobe region which may represent scarring given the persistence since May 2021. A degree of superimposed atelectasis and/or pneumonia in this area cannot be excluded. There is ill-defined airspace opacity in the left base, unchanged from 1 day prior not present on May 2021 study. Suspect developing pneumonia in this area. Central catheter tip is in the right atrium slightly beyond the cavoatrial junction. No pneumothorax. Heart size and pulmonary vascularity are normal. No adenopathy. No bone lesions. IMPRESSION: 1.  Apparent developing pneumonia left base. 2. Stable opacity right upper lobe which may represent a degree of scarring. Superimposed atelectasis and/or infiltrate cannot be excluded. 3.  Central catheter as described without pneumothorax. 4.  Stable cardiac silhouette. Electronically Signed   By: Lowella Grip III M.D.   On: 06/19/2020 08:39    DG Chest Port 1 View  Result Date: 06/18/2020 CLINICAL DATA:  Per order- Acute respiratory failure EXAM: PORTABLE CHEST 1 VIEW COMPARISON:  06/14/2020 FINDINGS: PICC line unchanged. Normal cardiac silhouette. Lungs are hyperinflated. There is again demonstrated RIGHT upper lobe streaky opacity representing atelectasis or infiltrate. No worsening. Lung bases are clear. No pneumothorax. IMPRESSION: 1. Stable support apparatus. 2. Hyperinflated  lungs. 3. Stable RIGHT upper lobe atelectasis or infiltrate. Electronically Signed   By: Suzy Bouchard M.D.   On: 06/18/2020 04:28   DG Chest Port 1 View  Result Date: 06/14/2020 CLINICAL DATA:  Cough EXAM: PORTABLE CHEST 1 VIEW COMPARISON:  02/08/2020 FINDINGS: Patchy density in the right lung. Left basilar atelectasis. No pleural effusion. No pneumothorax. Stable cardiomediastinal contours. Left PICC line tip overlies the superior right atrium. IMPRESSION: Patchy right lung atelectasis/consolidation. Left basilar atelectasis. Electronically Signed   By: Macy Mis M.D.   On: 06/14/2020 13:30   ECHOCARDIOGRAM COMPLETE  Result Date: 06/17/2020    ECHOCARDIOGRAM REPORT   Patient Name:   BOBBYE REINITZ Date of Exam: 06/17/2020 Medical Rec #:  [id number]  Height:       62.0 in Accession #:    [id number] Weight:       75.0 lb Date of Birth:  07-06-1996  BSA:          1.260 m Patient Age:    24 years   BP:           117/62 mmHg Patient Gender: M          HR:           63 bpm. Exam Location:  Inpatient Procedure: 2D Echo, Cardiac Doppler and Color Doppler Indications:    R06.02 SOB  History:        Patient has prior history of Echocardiogram examinations, most                 recent 05/16/2018. Abnormal ECG; Risk Factors:Hypertension.                 Pericardial effusion.  Sonographer:    Roseanna Rainbow RDCS Referring Phys: [id number] PETER E BABCOCK  Sonographer Comments: Technically difficult study due to poor echo windows and suboptimal subcostal window. Patient has very difficult  windows and limited mobility. Patient is on bipap. IMPRESSIONS  1. Left ventricular ejection fraction, by estimation, is 60 to 65%. The left ventricle has normal function. The left ventricle has no regional wall motion abnormalities. Left ventricular diastolic parameters were normal.  2. Right ventricular systolic function is normal. The right ventricular size is normal. There is normal pulmonary artery systolic pressure.  3. The mitral valve is normal in structure. No evidence of mitral valve regurgitation. No evidence of mitral stenosis.  4. The aortic valve is normal in structure. Aortic valve regurgitation is not visualized. No aortic stenosis is present. FINDINGS  Left Ventricle: Left ventricular ejection fraction, by estimation, is 60 to 65%. The left ventricle has normal function. The left ventricle has no regional wall motion abnormalities. The left ventricular internal cavity size was normal in size. There is  no left ventricular hypertrophy. Left ventricular diastolic parameters were normal. Right Ventricle: The right ventricular size is normal. No increase in right ventricular wall thickness. Right ventricular systolic function is normal. There is normal pulmonary artery systolic pressure. The tricuspid regurgitant velocity is 2.05 m/s, and  with an assumed right atrial pressure of 3 mmHg, the estimated right ventricular systolic pressure is 53.6 mmHg. Left Atrium: Left atrial size was normal in size. Right Atrium: Right atrial size was normal in size. Pericardium: There is no evidence of pericardial effusion. Mitral Valve: The mitral valve is normal in structure. No evidence of mitral valve regurgitation. No evidence of mitral valve stenosis. Tricuspid Valve: The tricuspid valve is normal in structure. Tricuspid valve regurgitation is trivial. Aortic Valve: The aortic valve  is normal in structure. Aortic valve regurgitation is not visualized. No aortic stenosis is present. Pulmonic Valve: The pulmonic  valve was normal in structure. Pulmonic valve regurgitation is not visualized. Aorta: The aortic root and ascending aorta are structurally normal, with no evidence of dilitation. IAS/Shunts: The atrial septum is grossly normal.  LEFT VENTRICLE PLAX 2D LVIDd:         4.40 cm     Diastology LVIDs:         2.80 cm     LV e' medial:    10.60 cm/s LV PW:         1.20 cm     LV E/e' medial:  8.4 LV IVS:        0.80 cm     LV e' lateral:   12.00 cm/s LVOT diam:     1.70 cm     LV E/e' lateral: 7.4 LV SV:         44 LV SV Index:   35 LVOT Area:     2.27 cm  LV Volumes (MOD) LV vol d, MOD A2C: 97.2 ml LV vol d, MOD A4C: 58.6 ml LV vol s, MOD A2C: 53.7 ml LV vol s, MOD A4C: 33.9 ml LV SV MOD A2C:     43.5 ml LV SV MOD A4C:     58.6 ml LV SV MOD BP:      32.4 ml RIGHT VENTRICLE RV S prime:     9.36 cm/s TAPSE (M-mode): 1.3 cm LEFT ATRIUM             Index       RIGHT ATRIUM          Index LA diam:        3.10 cm 2.46 cm/m  RA Area:     8.10 cm LA Vol (A2C):   22.2 ml 17.62 ml/m RA Volume:   19.50 ml 15.48 ml/m LA Vol (A4C):   22.5 ml 17.86 ml/m LA Biplane Vol: 24.2 ml 19.21 ml/m  AORTIC VALVE LVOT Vmax:   91.40 cm/s LVOT Vmean:  65.100 cm/s LVOT VTI:    0.192 m  AORTA Ao Root diam: 2.70 cm Ao Asc diam:  2.50 cm MITRAL VALVE               TRICUSPID VALVE MV Area (PHT): 3.99 cm    TR Peak grad:   16.8 mmHg MV Decel Time: 190 msec    TR Vmax:        205.00 cm/s MV E velocity: 88.70 cm/s MV A velocity: 61.70 cm/s  SHUNTS MV E/A ratio:  1.44        Systemic VTI:  0.19 m                            Systemic Diam: 1.70 cm Mertie Moores MD Electronically signed by Mertie Moores MD Signature Date/Time: 06/17/2020/2:19:31 PM    Final     Microbiology: Recent Results (from the past 240 hour(s))  SARS Coronavirus 2 by RT PCR (hospital order, performed in [place] hospital lab) Nasopharyngeal Nasopharyngeal Swab     Status: None   Collection Time: 06/14/20  1:28 PM   Specimen: Nasopharyngeal Swab  Result Value Ref Range  Status   SARS Coronavirus 2 NEGATIVE NEGATIVE Final    Comment: (NOTE) SARS-CoV-2 target nucleic acids are NOT DETECTED.  The SARS-CoV-2 RNA is generally detectable in upper and lower respiratory specimens during  the acute phase of infection. The lowest concentration of SARS-CoV-2 viral copies this assay can detect is 250 copies / mL. A negative result does not preclude SARS-CoV-2 infection and should not be used as the sole basis for treatment or other patient management decisions.  A negative result may occur with improper specimen collection / handling, submission of specimen other than nasopharyngeal swab, presence of viral mutation(s) within the areas targeted by this assay, and inadequate number of viral copies (<250 copies / mL). A negative result must be combined with clinical observations, patient history, and epidemiological information.  Fact Sheet for Patients:   StrictlyIdeas.no  Fact Sheet for Healthcare Providers: BankingDealers.co.za  This test is not yet approved or  cleared by the [place] FDA and has been authorized for detection and/or diagnosis of SARS-CoV-2 by FDA under an Emergency Use Authorization (EUA).  This EUA will remain in effect (meaning this test can be used) for the duration of the COVID-19 declaration under Section 564(b)(1) of the Act, 21 U.S.C. section 360bbb-3(b)(1), unless the authorization is terminated or revoked sooner.  Performed at [facility], [address] [place], [place] [address]   Blood culture (routine x 2)     Status: None   Collection Time: 06/14/20  3:08 PM   Specimen: BLOOD  Result Value Ref Range Status   Specimen Description   Final    BLOOD LEFT ARM PICC LINE Performed at [facility], [address] [place], [place] [address]    Special Requests   Final    BOTTLES DRAWN AEROBIC AND ANAEROBIC Blood Culture adequate volume Performed at [facility], [address] [place], [place] [address]    Culture   Final    NO GROWTH 5 DAYS Performed at [facility], [place] [address], [place], [place] [address]    Report Status 06/19/2020 FINAL  Final  Urine culture     Status: Abnormal   Collection Time: 06/14/20  4:04 PM   Specimen: In/Out Cath Urine  Result Value Ref Range Status   Specimen Description   Final    IN/OUT CATH URINE Performed at [facility], [address] [place], [place] [address]    Special Requests   Final    NONE Performed at [facility], [address] [place], [place] [address]    Culture MULTIPLE SPECIES PRESENT, SUGGEST RECOLLECTION (A)  Final   Report Status 06/15/2020 FINAL  Final  MRSA PCR Screening     Status: None   Collection Time: 06/14/20 11:37 PM   Specimen: Nasopharyngeal  Result Value Ref Range Status   MRSA by PCR NEGATIVE NEGATIVE Final    Comment:        The GeneXpert MRSA Assay (FDA approved for NASAL specimens only), is one component of a comprehensive MRSA colonization surveillance program. It is not intended to diagnose MRSA infection nor to guide or monitor treatment for MRSA infections. Performed at [facility], [place] [address], [place], [place] [address]   Body fluid culture     Status: None   Collection Time: 06/17/20  3:30 PM   Specimen: Pleura; Body Fluid  Result Value Ref Range Status   Specimen Description   Final    PLEURAL Performed at [place] [address], [place],  [address]    Special Requests   Final    NONE Performed at [facility], [place] [address], [place], [place] [address]    Gram Stain   Final    RARE WBC PRESENT, PREDOMINANTLY MONONUCLEAR NO ORGANISMS SEEN    Culture   Final    NO GROWTH 3 DAYS Performed at [facility], [place] [address], [place], [place] [address]    Report Status 06/21/2020 FINAL  Final     Labs: Basic  Metabolic Panel: Recent Labs  Lab 06/17/20 0545 06/18/20 0535 06/19/20 0445 06/20/20 0612 06/21/20 0310  NA 138 136 136 135 135  K 3.4* 3.5 3.8 3.9 4.0  CL 101 97* 101 101 101  CO2 $Re'25 28 26 26 25  'LLJ$ GLUCOSE 190* 104* 112* 101* 95  BUN <5* <5* $Remov'6 12 13  'xCVyzS$ CREATININE <0.30* <0.30* <0.30* <0.30* <0.30*  CALCIUM 8.4* 8.3* 8.6* 8.6* 8.5*  MG 1.4* 1.7 1.9 1.6* 2.0  PHOS 1.6* 2.1* 2.9 3.2 3.2   Liver Function Tests: Recent Labs  Lab 06/16/20 0813 06/17/20 0545 06/18/20 0535 06/20/20 0612 06/21/20 0310  AST 47* 32 28 79* 78*  ALT 115* 80* 68* 83* 91*  ALKPHOS 115 90 100 92 92  BILITOT 1.4* 1.6* 1.5* 0.8 0.6  PROT 6.1* 5.1* 5.5* 5.9* 6.3*  ALBUMIN 3.3* 2.9* 3.0* 3.3* 3.5   Recent Labs  Lab 06/14/20 1328  LIPASE 17   No results for input(s): AMMONIA in the last 168 hours. CBC: Recent Labs  Lab 06/14/20 1328 06/15/20 0536 06/17/20 0545 06/18/20 0535 06/19/20 0445 06/20/20 0612 06/21/20 0310  WBC 31.0*   < > 3.6* 3.8* 4.6 4.9 5.5  NEUTROABS 25.2*  --   --   --  1.8 2.1 2.9  HGB 13.1   < > 9.9* 10.7* 11.0* 10.8* 10.3*  HCT 41.1   < > 30.9* 33.1* 33.2* 33.3* 31.2*  MCV 82.9   < > 84.2 82.5 81.6 81.8 82.1  PLT 237   < > 102* 113* 133* 123* 136*   < > = values in this interval not displayed.   Cardiac Enzymes: No results for input(s): CKTOTAL, CKMB, CKMBINDEX, TROPONINI in the last 168 hours. BNP: BNP (last 3 results) No results for input(s): BNP in the last 8760 hours.  ProBNP (last 3 results) No results for input(s): PROBNP in the last 8760 hours.  CBG: Recent Labs  Lab 06/20/20 0557 06/20/20 1155 06/20/20 1724 06/21/20 0023 06/21/20 0617  GLUCAP 115* 105* 110* 99 108*       Signed:  Nita Sells MD   [facility] 06/21/2020, 11:00 AM

## 2020-06-21 NOTE — TOC Transition Note (Signed)
Transition of Care [facility]) - CM/SW Discharge Note   Patient Details  Name: Peter Hayes MRN: [id number] Date of Birth: 04/27/1996  Transition of Care [facility]) CM/SW Contact:  Leeroy Cha, RN Phone Number: 06/21/2020, 11:29 AM   Clinical Narrative:    Patient discharged to return to home wkth home health care for tpn.  RN will be through [place] and tpn service through [employer] with [employer] and the [employer].  Tpn will be delivered to the house tonight. Spoje direct with Carolynn Sayers and rn service and tpn are set up.   Final next level of care: [place] Barriers to Discharge: Barriers Resolved   Patient Goals and CMS Choice Patient states their goals for this hospitalization and ongoing recovery are:: to return to home CMS Medicare.gov Compare Post Acute Care list provided to:: Patient Choice offered to / list presented to : Patient  Discharge Placement                       Discharge Plan and Services   Discharge Planning Services: CM Consult Post Acute Care Choice: Home Health, Resumption of Svcs/PTA Provider                    HH Arranged: RN, TPN HH Agency: [place] ([place]), [place] Date [place]: 06/21/20 Time [place]: 1129 Representative spoke with at [place]: [place] (SDOH) Interventions     Readmission Risk Interventions No flowsheet data found.

## 2020-08-13 IMAGING — DX DG CHEST 1V PORT
1 series · 1 of 1 positions shown · non-contrast
Comparison: November 01, 2019

CLINICAL DATA: Congestion and cough

EXAM:
PORTABLE CHEST 1 VIEW

[chest ap]
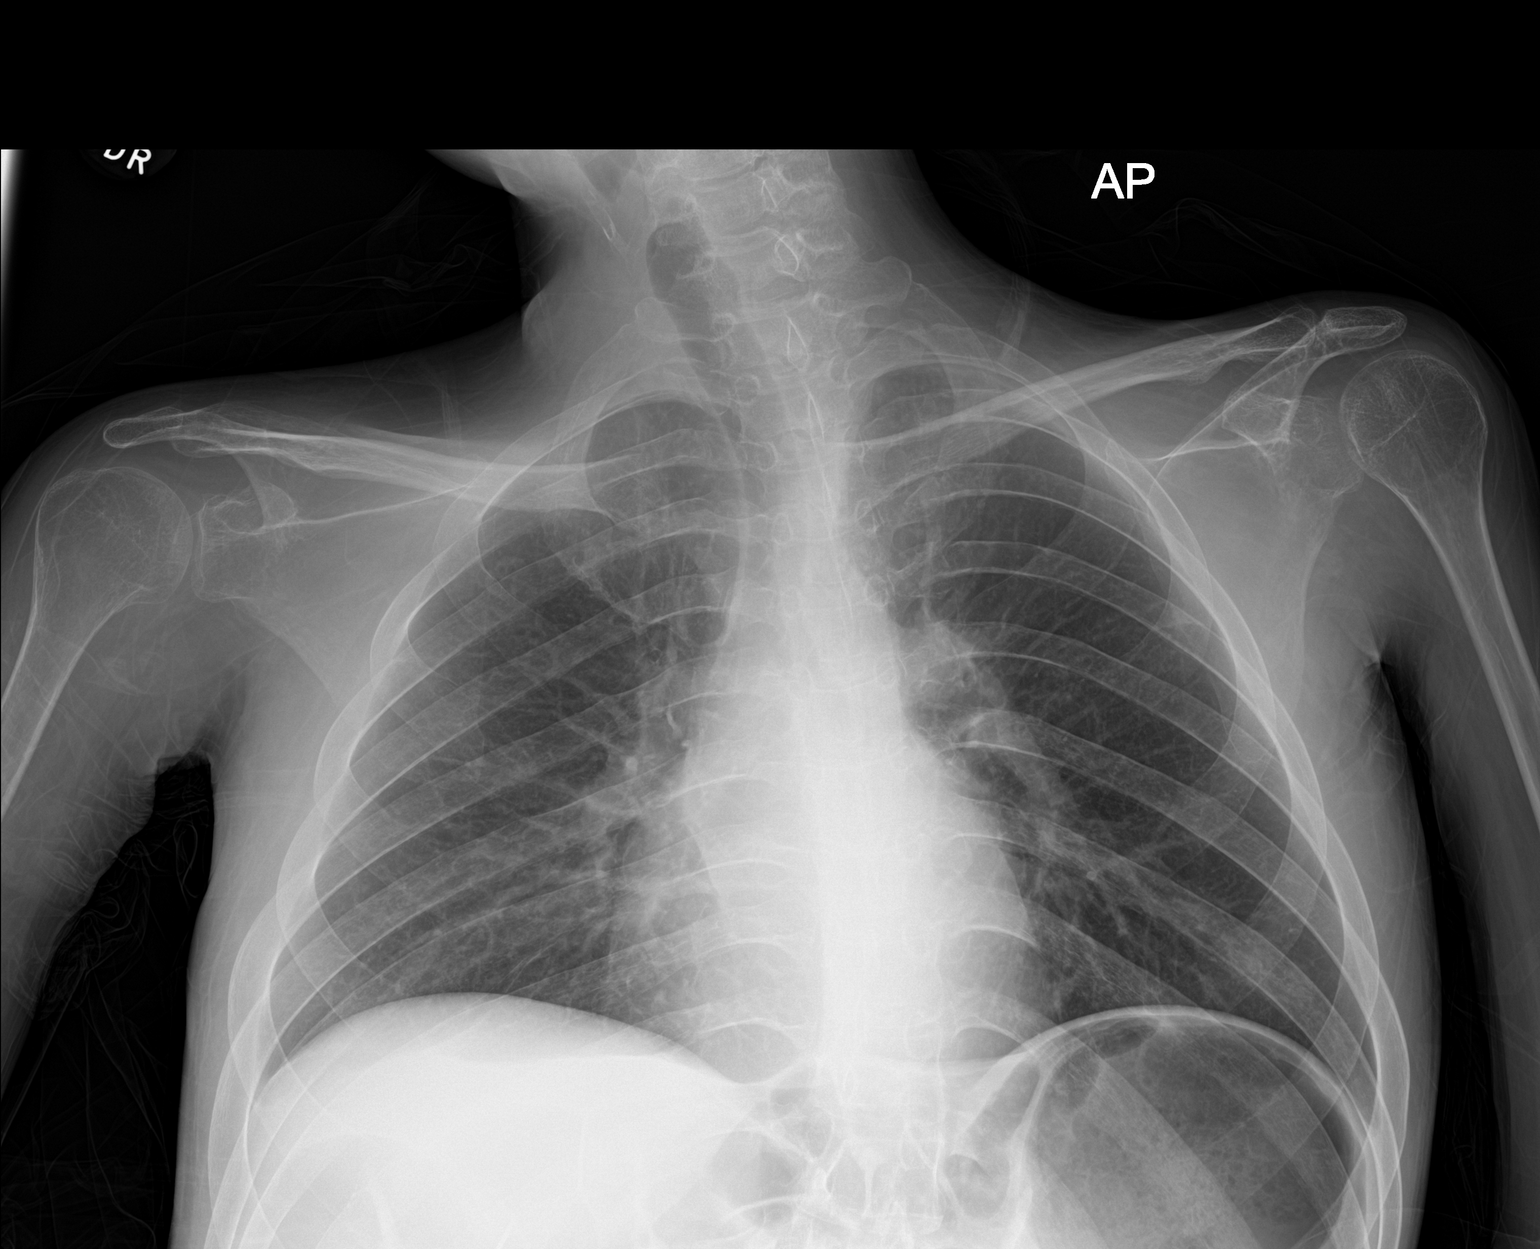

[1 of 1 positions shown; findings below may reference images not displayed]

FINDINGS: The heart size and mediastinal contours are within normal limits.
Both lungs are clear. The visualized skeletal structures are
unremarkable.
IMPRESSION: No active disease.

## 2020-08-20 ENCOUNTER — Emergency Department (HOSPITAL_BASED_OUTPATIENT_CLINIC_OR_DEPARTMENT_OTHER): Payer: Managed Care, Other (non HMO)

## 2020-08-20 ENCOUNTER — Encounter (HOSPITAL_BASED_OUTPATIENT_CLINIC_OR_DEPARTMENT_OTHER): Payer: Self-pay | Admitting: Emergency Medicine

## 2020-08-20 ENCOUNTER — Other Ambulatory Visit: Payer: Self-pay

## 2020-08-20 ENCOUNTER — Emergency Department (HOSPITAL_BASED_OUTPATIENT_CLINIC_OR_DEPARTMENT_OTHER)
Admission: EM | Admit: 2020-08-20 | Discharge: 2020-08-20 | Disposition: A | Discharge status: Home / Self Care | Payer: Managed Care, Other (non HMO) | Source: Home / Self Care | Attending: Emergency Medicine | Admitting: Emergency Medicine

## 2020-08-20 DIAGNOSIS — R110 Nausea: Secondary | ICD-10-CM | POA: Insufficient documentation

## 2020-08-20 DIAGNOSIS — Z20822 Contact with and (suspected) exposure to covid-19: Secondary | ICD-10-CM | POA: Insufficient documentation

## 2020-08-20 DIAGNOSIS — T80211A Bloodstream infection due to central venous catheter, initial encounter: Secondary | ICD-10-CM | POA: Diagnosis not present

## 2020-08-20 DIAGNOSIS — R140 Abdominal distension (gaseous): Secondary | ICD-10-CM | POA: Insufficient documentation

## 2020-08-20 DIAGNOSIS — J180 Bronchopneumonia, unspecified organism: Secondary | ICD-10-CM

## 2020-08-20 DIAGNOSIS — R7881 Bacteremia: Secondary | ICD-10-CM | POA: Diagnosis not present

## 2020-08-20 LAB — CBC WITH DIFFERENTIAL/PLATELET
Abs Immature Granulocytes: 0.02 10*3/uL (ref 0.00–0.07)
Basophils Absolute: 0 10*3/uL (ref 0.0–0.1)
Basophils Relative: 0 %
Eosinophils Absolute: 0 10*3/uL (ref 0.0–0.5)
Eosinophils Relative: 0 %
HCT: 39.6 % (ref 39.0–52.0)
Hemoglobin: 12.6 g/dL — ABNORMAL LOW (ref 13.0–17.0)
Immature Granulocytes: 0 %
Lymphocytes Relative: 4 %
Lymphs Abs: 0.3 10*3/uL — ABNORMAL LOW (ref 0.7–4.0)
MCH: 26.4 pg (ref 26.0–34.0)
MCHC: 31.8 g/dL (ref 30.0–36.0)
MCV: 82.8 fL (ref 80.0–100.0)
Monocytes Absolute: 0.7 10*3/uL (ref 0.1–1.0)
Monocytes Relative: 9 %
Neutro Abs: 6.3 10*3/uL (ref 1.7–7.7)
Neutrophils Relative %: 87 %
Platelets: 105 10*3/uL — ABNORMAL LOW (ref 150–400)
RBC: 4.78 MIL/uL (ref 4.22–5.81)
RDW: 15.3 % (ref 11.5–15.5)
WBC: 7.2 10*3/uL (ref 4.0–10.5)
nRBC: 0 % (ref 0.0–0.2)

## 2020-08-20 LAB — COMPREHENSIVE METABOLIC PANEL
ALT: 173 U/L — ABNORMAL HIGH (ref 0–44)
AST: 110 U/L — ABNORMAL HIGH (ref 15–41)
Albumin: 4.4 g/dL (ref 3.5–5.0)
Alkaline Phosphatase: 163 U/L — ABNORMAL HIGH (ref 38–126)
Anion gap: 14 (ref 5–15)
BUN: 13 mg/dL (ref 6–20)
CO2: 26 mmol/L (ref 22–32)
Calcium: 8.9 mg/dL (ref 8.9–10.3)
Chloride: 96 mmol/L — ABNORMAL LOW (ref 98–111)
Creatinine, Ser: <0.3 mg/dL — ABNORMAL LOW (ref 0.61–1.24)
Glucose, Bld: 108 mg/dL — ABNORMAL HIGH (ref 70–99)
Potassium: 3.2 mmol/L — ABNORMAL LOW (ref 3.5–5.1)
Sodium: 136 mmol/L (ref 135–145)
Total Bilirubin: 1.2 mg/dL (ref 0.3–1.2)
Total Protein: 8.5 g/dL — ABNORMAL HIGH (ref 6.5–8.1)

## 2020-08-20 LAB — URINALYSIS, ROUTINE W REFLEX MICROSCOPIC
Glucose, UA: NEGATIVE mg/dL
Hgb urine dipstick: NEGATIVE
Ketones, ur: >80 mg/dL — AB
Leukocytes,Ua: NEGATIVE
Nitrite: NEGATIVE
Protein, ur: NEGATIVE mg/dL
Specific Gravity, Urine: 1.025 (ref 1.005–1.030)
pH: 6 (ref 5.0–8.0)

## 2020-08-20 LAB — LIPASE, BLOOD: Lipase: 27 U/L (ref 11–51)

## 2020-08-20 LAB — LACTIC ACID, PLASMA
Lactic Acid, Venous: 0.6 mmol/L (ref 0.5–1.9)
Lactic Acid, Venous: 0.6 mmol/L (ref 0.5–1.9)

## 2020-08-20 LAB — RESPIRATORY PANEL BY RT PCR (FLU A&B, COVID)
Influenza A by PCR: NEGATIVE
Influenza B by PCR: NEGATIVE
SARS Coronavirus 2 by RT PCR: NEGATIVE

## 2020-08-20 LAB — TROPONIN I (HIGH SENSITIVITY): Troponin I (High Sensitivity): 10 ng/L (ref <18)

## 2020-08-20 MED ORDER — DOXYCYCLINE HYCLATE 100 MG PO CAPS: 100.0000 mg | ORAL_CAPSULE | Freq: Two times a day (BID) | ORAL | 0 refills | Status: DC

## 2020-08-20 MED ORDER — IBUPROFEN 400 MG PO TABS
600.0000 mg | ORAL_TABLET | Freq: Once | ORAL | Status: AC
  Administered 2020-08-20: 600 mg via ORAL
  Filled 2020-08-20: qty 1

## 2020-08-20 MED ORDER — SODIUM CHLORIDE 0.9 % IV BOLUS
500.0000 mL | Freq: Once | INTRAVENOUS | Status: AC
  Administered 2020-08-20: 500 mL via INTRAVENOUS

## 2020-08-20 NOTE — ED Triage Notes (Signed)
Pt presents with dad. C/o fever and abd distention since last night. Pt denies pain. Hx of muscular dystrophy and is wheelchair bound.

## 2020-08-20 NOTE — Discharge Instructions (Signed)
You were seen in the emergency department today with fever.  Her chest x-ray shows possible pneumonia.  I am starting antibiotics but you will need to follow closely with your primary care doctor in the next 48 hours.  If your symptoms worsen you should return to the emergency department immediately.  Please continue your home medications.

## 2020-08-20 NOTE — ED Notes (Signed)
Pt had Tylenol around 1300 per father

## 2020-08-20 NOTE — ED Provider Notes (Signed)
Emergency Department Provider Note   I have reviewed the triage vital signs and the nursing notes.   HISTORY  Chief Complaint Fever   HPI Peter Hayes is a 24 y.o. male with past medical history of muscular dystrophy presents to the emergency department with fever today and abdominal discomfort with mild distention.  Patient describes some mild abdominal discomfort along with nausea.  He reportedly had a temperature earlier of 102 F and was given Tylenol shortly prior to arrival.  Patient denies any shortness of breath or chest pain.  He is on his baseline home O2 (2L).  Patient denies severe headaches.  He is currently on TPN and has a PICC line in place in the LUE. No cough. No vomiting. Patient is having BMs with 3 yesterday of normal consistency.    Past Medical History:  Diagnosis Date  . Acute respiratory failure with hypoxia and hypercapnia ([place]) 05/16/2018  . HCAP (healthcare-associated pneumonia) 12/01/2017  . High anion gap metabolic acidosis 06/01/36  . Hypotension   . Ileus ([place]) 09/23/2017  . Leukocytosis 05/05/2016  . MD (muscular dystrophy) ([place])   . Refusal of blood product    patient is Peter Hayes witness  . Respiratory alkalosis 05/05/2016  . Sepsis ([place]) 05/05/2016  . Severe sepsis ([place]) 09/16/2019  . Sinus tachycardia 05/05/2016    Patient Active Problem List   Diagnosis Date Noted  . Sepsis ([place]) 06/14/2020  . Pneumomediastinum ([place]) 02/06/2020  . Pressure injury of skin 09/17/2019  . 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 Hypoglycemia without diagnosis of diabetes mellitus 09/24/2017  . Muscular dystrophy ([place]) 05/05/2016  . Moderate protein-calorie malnutrition ([place]) 05/05/2016    Past Surgical History:  Procedure Laterality Date  . COLOSTOMY    . EYE SURGERY    . FLEXIBLE SIGMOIDOSCOPY N/A 01/10/2018   Procedure: FLEXIBLE SIGMOIDOSCOPY;  Surgeon: Laurence Spates, MD;  Location: [facility];  Service: Endoscopy;  Laterality:  N/A;  . SMALL INTESTINE SURGERY      Allergies Fentanyl  No family history on file.  Social History Social History   Tobacco Use  . Smoking status: Never Smoker  . Smokeless tobacco: Never Used  Vaping Use  . Vaping Use: Never used  Substance Use Topics  . Alcohol use: No  . Drug use: No    Review of Systems  Constitutional: Positive fever.  Eyes: No visual changes. ENT: No sore throat. Cardiovascular: Denies chest pain. Respiratory: Denies shortness of breath. Gastrointestinal: Mild diffuse abdominal pain. Positive nausea, no vomiting.  No diarrhea.  No constipation. Genitourinary: Negative for dysuria. Musculoskeletal: Negative for back pain. Skin: Negative for rash. Neurological: Negative for headaches, focal weakness or numbness.  10-point ROS otherwise negative.  ____________________________________________   PHYSICAL EXAM:  VITAL SIGNS: ED Triage Vitals  Enc Vitals Group     BP 08/20/20 1459 131/79     Pulse Rate 08/20/20 1458 (!) 125     Resp 08/20/20 1507 20     Temp --      Temp src --      SpO2 08/20/20 1458 99 %     Weight 08/20/20 1506 92 lb 6 oz (41.9 kg)   Constitutional: Alert and oriented. Well appearing and in no acute distress. Eyes: Conjunctivae are normal.  Head: Atraumatic. Nose: No congestion/rhinnorhea. Mouth/Throat: Mucous membranes are slightly dry.  Neck: No stridor.   Cardiovascular: Tachycardia. Good peripheral circulation. Grossly normal heart sounds.   Respiratory: Normal respiratory effort.  No retractions.  Lungs CTAB. Gastrointestinal: Soft with mild tenderness diffusely. No rebound or guarding. Mild distention.  Musculoskeletal: Wasting in the extremities bilaterally (baseline).  Neurologic:  Normal speech and language. Weakness and wheelchair bound with muscular dystrophy.  Skin:  Skin is warm, dry and intact. No rash noted.  ____________________________________________   LABS (all labs ordered are listed, but only  abnormal results are displayed)  Labs Reviewed  COMPREHENSIVE METABOLIC PANEL - Abnormal; Notable for the following components:      Result Value   Potassium 3.2 (*)    Chloride 96 (*)    Glucose, Bld 108 (*)    Creatinine, Ser <0.30 (*)    Total Protein 8.5 (*)    AST 110 (*)    ALT 173 (*)    Alkaline Phosphatase 163 (*)    All other components within normal limits  CBC WITH DIFFERENTIAL/PLATELET - Abnormal; Notable for the following components:   Hemoglobin 12.6 (*)    Platelets 105 (*)    Lymphs Abs 0.3 (*)    All other components within normal limits  URINALYSIS, ROUTINE W REFLEX MICROSCOPIC - Abnormal; Notable for the following components:   Bilirubin Urine SMALL (*)    Ketones, ur >80 (*)    All other components within normal limits  RESPIRATORY PANEL BY RT PCR (FLU A&B, COVID)  CULTURE, BLOOD (ROUTINE X 2)  URINE CULTURE  LIPASE, BLOOD  LACTIC ACID, PLASMA  LACTIC ACID, PLASMA  TROPONIN I (HIGH SENSITIVITY)   ____________________________________________  EKG   EKG Interpretation  Date/Time:  Saturday August 20 2020 16:23:55 EST Ventricular Rate:  110 PR Interval:    QRS Duration: 102 QT Interval:  355 QTC Calculation: 481 R Axis:   132 Text Interpretation: Sinus tachycardia Probable right ventricular hypertrophy Baseline wandering. Nonspecific ST changes similar to prior. No STEMI Confirmed by Nanda Quinton [phone]) on 08/20/2020 4:34:36 PM       ____________________________________________  RADIOLOGY  DG Abdomen Acute W/Chest  Result Date: 08/20/2020 CLINICAL DATA:  Fever, abdominal distension, nausea EXAM: DG ABDOMEN ACUTE WITH 1 VIEW CHEST COMPARISON:  08/27/2018 FINDINGS: Supine and upright frontal views of the abdomen as well as an upright frontal view of the chest are obtained. Left-sided PICC tip overlies superior vena cava. The cardiac silhouette is stable. Right suprahilar airspace disease compatible with bronchopneumonia. No effusion or  pneumothorax. Diffuse gaseous distention of bowel without obstruction or ileus. No masses or abnormal calcifications. No free gas in the greater peritoneal sac. IMPRESSION: 1. Right suprahilar airspace disease consistent with bronchopneumonia. 2. Diffuse gaseous distension of large and small bowel, without signs of obstruction or ileus. Electronically Signed   By: Randa Ngo M.D.   On: 08/20/2020 15:56    ____________________________________________   PROCEDURES  Procedure(s) performed:   Procedures  None  ____________________________________________   INITIAL IMPRESSION / ASSESSMENT AND PLAN / ED COURSE  Pertinent labs & imaging results that were available during my care of the patient were reviewed by me and considered in my medical decision making (see chart for details).   Patient presents to the emergency department evaluation of abdominal distention with fever and mild nausea.  He is awake and alert and conversational.  He is in no respiratory distress and on his home oxygen.  No clear source of infection on exam.  His PICC line site of appears well. Last admit in September with sepsis but no clear infection source identified. Will start with acute abdomen series including chest, COVID swabs, and sepsis labs.  07:51 PM  Patient reevaluated here after labs are resulting.  He has no leukocytosis.  Lactate negative x2.  He has had some tachycardia but has missed 2 doses of his metoprolol today.  Patient developed fever here and tachycardia improved with IV fluids and fever reduction.  His abdomen is feeling much better.  Plain film shows possible bronchopneumonia.  He has some bowel gas but no pattern to suspect bowel obstruction or volvulus.  Dad has been massaging his abdomen in the room and patient has been passing gas and reports feeling much better.  UA with no evidence of infection.  Plan to treat bronchopneumonia as a source of fever.  Covid testing negative.  Patient is not  having increased respiratory rate or requiring increased oxygen to necessitate admission for treatment.  Discussed strict ED return precautions along with close PCP follow-up plan. Blood Cx sent from PICC line and Urine culture sent as well.  ____________________________________________  FINAL CLINICAL IMPRESSION(S) / ED DIAGNOSES  Final diagnoses:  Bronchopneumonia  Abdominal distension     MEDICATIONS GIVEN DURING THIS VISIT:  Medications  sodium chloride 0.9 % bolus 500 mL ( Intravenous Stopped 08/20/20 1757)  ibuprofen (ADVIL) tablet 600 mg (600 mg Oral Given 08/20/20 1754)     NEW OUTPATIENT MEDICATIONS STARTED DURING THIS VISIT:  New Prescriptions   DOXYCYCLINE (VIBRAMYCIN) 100 MG CAPSULE    Take 1 capsule (100 mg total) by mouth 2 (two) times daily for 7 days.    Note:  This document was prepared using Dragon voice recognition software and may include unintentional dictation errors.  Nanda Quinton, MD, [facility]    Trevor Wilkie, [facility], MD 08/20/20 [phone]

## 2020-08-20 NOTE — ED Notes (Signed)
ED Provider at bedside. 

## 2020-08-21 ENCOUNTER — Emergency Department (HOSPITAL_COMMUNITY): Payer: Managed Care, Other (non HMO)

## 2020-08-21 ENCOUNTER — Encounter (HOSPITAL_COMMUNITY): Payer: Self-pay

## 2020-08-21 ENCOUNTER — Other Ambulatory Visit: Payer: Self-pay

## 2020-08-21 ENCOUNTER — Inpatient Hospital Stay (HOSPITAL_COMMUNITY): Payer: Managed Care, Other (non HMO)

## 2020-08-21 ENCOUNTER — Inpatient Hospital Stay (HOSPITAL_COMMUNITY)
Admission: EM | Admit: 2020-08-21 | Discharge: 2020-08-27 | DRG: 314 | Disposition: A | Discharge status: Home Health | Payer: Managed Care, Other (non HMO) | Attending: Internal Medicine | Admitting: Internal Medicine

## 2020-08-21 DIAGNOSIS — D638 Anemia in other chronic diseases classified elsewhere: Secondary | ICD-10-CM | POA: Diagnosis present

## 2020-08-21 DIAGNOSIS — G9341 Metabolic encephalopathy: Secondary | ICD-10-CM | POA: Diagnosis present

## 2020-08-21 DIAGNOSIS — A419 Sepsis, unspecified organism: Secondary | ICD-10-CM | POA: Diagnosis present

## 2020-08-21 DIAGNOSIS — R64 Cachexia: Secondary | ICD-10-CM | POA: Diagnosis present

## 2020-08-21 DIAGNOSIS — Z452 Encounter for adjustment and management of vascular access device: Secondary | ICD-10-CM | POA: Diagnosis not present

## 2020-08-21 DIAGNOSIS — B9561 Methicillin susceptible Staphylococcus aureus infection as the cause of diseases classified elsewhere: Secondary | ICD-10-CM | POA: Diagnosis not present

## 2020-08-21 DIAGNOSIS — T80211A Bloodstream infection due to central venous catheter, initial encounter: Secondary | ICD-10-CM | POA: Diagnosis present

## 2020-08-21 DIAGNOSIS — Z79899 Other long term (current) drug therapy: Secondary | ICD-10-CM

## 2020-08-21 DIAGNOSIS — J939 Pneumothorax, unspecified: Secondary | ICD-10-CM | POA: Diagnosis present

## 2020-08-21 DIAGNOSIS — R000 Tachycardia, unspecified: Secondary | ICD-10-CM | POA: Diagnosis present

## 2020-08-21 DIAGNOSIS — E43 Unspecified severe protein-calorie malnutrition: Secondary | ICD-10-CM | POA: Diagnosis present

## 2020-08-21 DIAGNOSIS — G7100 Muscular dystrophy, unspecified: Secondary | ICD-10-CM | POA: Diagnosis not present

## 2020-08-21 DIAGNOSIS — R7401 Elevation of levels of liver transaminase levels: Secondary | ICD-10-CM

## 2020-08-21 DIAGNOSIS — Y838 Other surgical procedures as the cause of abnormal reaction of the patient, or of later complication, without mention of misadventure at the time of the procedure: Secondary | ICD-10-CM | POA: Diagnosis present

## 2020-08-21 DIAGNOSIS — Z993 Dependence on wheelchair: Secondary | ICD-10-CM | POA: Diagnosis not present

## 2020-08-21 DIAGNOSIS — T80219A Unspecified infection due to central venous catheter, initial encounter: Secondary | ICD-10-CM

## 2020-08-21 DIAGNOSIS — Z885 Allergy status to narcotic agent status: Secondary | ICD-10-CM | POA: Diagnosis not present

## 2020-08-21 DIAGNOSIS — J9611 Chronic respiratory failure with hypoxia: Secondary | ICD-10-CM | POA: Diagnosis present

## 2020-08-21 DIAGNOSIS — Z933 Colostomy status: Secondary | ICD-10-CM

## 2020-08-21 DIAGNOSIS — R945 Abnormal results of liver function studies: Secondary | ICD-10-CM | POA: Diagnosis not present

## 2020-08-21 DIAGNOSIS — Z9981 Dependence on supplemental oxygen: Secondary | ICD-10-CM

## 2020-08-21 DIAGNOSIS — R7881 Bacteremia: Secondary | ICD-10-CM | POA: Diagnosis present

## 2020-08-21 DIAGNOSIS — K7200 Acute and subacute hepatic failure without coma: Secondary | ICD-10-CM | POA: Diagnosis not present

## 2020-08-21 DIAGNOSIS — A4101 Sepsis due to Methicillin susceptible Staphylococcus aureus: Secondary | ICD-10-CM | POA: Diagnosis present

## 2020-08-21 DIAGNOSIS — J180 Bronchopneumonia, unspecified organism: Secondary | ICD-10-CM | POA: Diagnosis present

## 2020-08-21 DIAGNOSIS — I9589 Other hypotension: Secondary | ICD-10-CM | POA: Diagnosis present

## 2020-08-21 DIAGNOSIS — R7989 Other specified abnormal findings of blood chemistry: Secondary | ICD-10-CM

## 2020-08-21 DIAGNOSIS — Z681 Body mass index (BMI) 19 or less, adult: Secondary | ICD-10-CM

## 2020-08-21 DIAGNOSIS — J90 Pleural effusion, not elsewhere classified: Secondary | ICD-10-CM | POA: Diagnosis present

## 2020-08-21 DIAGNOSIS — E162 Hypoglycemia, unspecified: Secondary | ICD-10-CM | POA: Diagnosis present

## 2020-08-21 DIAGNOSIS — E876 Hypokalemia: Secondary | ICD-10-CM | POA: Diagnosis present

## 2020-08-21 DIAGNOSIS — L89152 Pressure ulcer of sacral region, stage 2: Secondary | ICD-10-CM | POA: Diagnosis present

## 2020-08-21 DIAGNOSIS — J9612 Chronic respiratory failure with hypercapnia: Secondary | ICD-10-CM | POA: Diagnosis present

## 2020-08-21 DIAGNOSIS — G7101 Duchenne or Becker muscular dystrophy: Secondary | ICD-10-CM | POA: Diagnosis present

## 2020-08-21 DIAGNOSIS — R6520 Severe sepsis without septic shock: Secondary | ICD-10-CM | POA: Diagnosis present

## 2020-08-21 DIAGNOSIS — I361 Nonrheumatic tricuspid (valve) insufficiency: Secondary | ICD-10-CM | POA: Diagnosis not present

## 2020-08-21 DIAGNOSIS — Z20822 Contact with and (suspected) exposure to covid-19: Secondary | ICD-10-CM | POA: Diagnosis present

## 2020-08-21 DIAGNOSIS — B957 Other staphylococcus as the cause of diseases classified elsewhere: Secondary | ICD-10-CM | POA: Diagnosis present

## 2020-08-21 DIAGNOSIS — D61818 Other pancytopenia: Secondary | ICD-10-CM | POA: Diagnosis present

## 2020-08-21 LAB — CBG MONITORING, ED
Glucose-Capillary: 98 mg/dL (ref 70–99)
Glucose-Capillary: 135 mg/dL — ABNORMAL HIGH (ref 70–99)
Glucose-Capillary: 98 mg/dL (ref 70–99)
Glucose-Capillary: 72 mg/dL (ref 70–99)
Glucose-Capillary: 63 mg/dL — ABNORMAL LOW (ref 70–99)

## 2020-08-21 LAB — BLOOD CULTURE ID PANEL (REFLEXED) - BCID2

## 2020-08-21 LAB — ECHOCARDIOGRAM COMPLETE
Area-P 1/2: 3.31 cm2
S' Lateral: 2.7 cm
Weight: 1440 oz

## 2020-08-21 LAB — COMPREHENSIVE METABOLIC PANEL
ALT: 1573 U/L — ABNORMAL HIGH (ref 0–44)
AST: 1897 U/L — ABNORMAL HIGH (ref 15–41)
Albumin: 4.2 g/dL (ref 3.5–5.0)
Alkaline Phosphatase: 292 U/L — ABNORMAL HIGH (ref 38–126)
Anion gap: 8 (ref 5–15)
BUN: 22 mg/dL — ABNORMAL HIGH (ref 6–20)
CO2: 27 mmol/L (ref 22–32)
Calcium: 8.8 mg/dL — ABNORMAL LOW (ref 8.9–10.3)
Chloride: 101 mmol/L (ref 98–111)
Creatinine, Ser: <0.3 mg/dL — ABNORMAL LOW (ref 0.61–1.24)
Glucose, Bld: 160 mg/dL — ABNORMAL HIGH (ref 70–99)
Potassium: 2.1 mmol/L — CL (ref 3.5–5.1)
Sodium: 136 mmol/L (ref 135–145)
Total Bilirubin: 3 mg/dL — ABNORMAL HIGH (ref 0.3–1.2)
Total Protein: 8.5 g/dL — ABNORMAL HIGH (ref 6.5–8.1)

## 2020-08-21 LAB — CBC WITH DIFFERENTIAL/PLATELET
Abs Immature Granulocytes: 0.04 10*3/uL (ref 0.00–0.07)
Basophils Absolute: 0 10*3/uL (ref 0.0–0.1)
Basophils Relative: 0 %
Eosinophils Absolute: 0 10*3/uL (ref 0.0–0.5)
Eosinophils Relative: 0 %
HCT: 41 % (ref 39.0–52.0)
Hemoglobin: 13 g/dL (ref 13.0–17.0)
Immature Granulocytes: 1 %
Lymphocytes Relative: 2 %
Lymphs Abs: 0.1 10*3/uL — ABNORMAL LOW (ref 0.7–4.0)
MCH: 26.9 pg (ref 26.0–34.0)
MCHC: 31.7 g/dL (ref 30.0–36.0)
MCV: 84.7 fL (ref 80.0–100.0)
Monocytes Absolute: 0.1 10*3/uL (ref 0.1–1.0)
Monocytes Relative: 1 %
Neutro Abs: 7.3 10*3/uL (ref 1.7–7.7)
Neutrophils Relative %: 96 %
Platelets: 95 10*3/uL — ABNORMAL LOW (ref 150–400)
RBC: 4.84 MIL/uL (ref 4.22–5.81)
RDW: 15 % (ref 11.5–15.5)
WBC: 7.6 10*3/uL (ref 4.0–10.5)
nRBC: 0 % (ref 0.0–0.2)

## 2020-08-21 LAB — BASIC METABOLIC PANEL
Anion gap: 7 (ref 5–15)
BUN: 13 mg/dL (ref 6–20)
CO2: 25 mmol/L (ref 22–32)
Calcium: 8.4 mg/dL — ABNORMAL LOW (ref 8.9–10.3)
Chloride: 102 mmol/L (ref 98–111)
Creatinine, Ser: <0.3 mg/dL — ABNORMAL LOW (ref 0.61–1.24)
Glucose, Bld: 69 mg/dL — ABNORMAL LOW (ref 70–99)
Potassium: 2.8 mmol/L — ABNORMAL LOW (ref 3.5–5.1)
Sodium: 134 mmol/L — ABNORMAL LOW (ref 135–145)

## 2020-08-21 LAB — PROTIME-INR
INR: 1.7 — ABNORMAL HIGH (ref 0.8–1.2)
Prothrombin Time: 19.2 seconds — ABNORMAL HIGH (ref 11.4–15.2)

## 2020-08-21 LAB — GLUCOSE, CAPILLARY
Glucose-Capillary: 45 mg/dL — ABNORMAL LOW (ref 70–99)
Glucose-Capillary: 63 mg/dL — ABNORMAL LOW (ref 70–99)
Glucose-Capillary: 100 mg/dL — ABNORMAL HIGH (ref 70–99)
Glucose-Capillary: 112 mg/dL — ABNORMAL HIGH (ref 70–99)
Glucose-Capillary: 106 mg/dL — ABNORMAL HIGH (ref 70–99)

## 2020-08-21 LAB — URINALYSIS, ROUTINE W REFLEX MICROSCOPIC
Bacteria, UA: NONE SEEN
Bilirubin Urine: NEGATIVE
Glucose, UA: NEGATIVE mg/dL
Hgb urine dipstick: NEGATIVE
Ketones, ur: 20 mg/dL — AB
Leukocytes,Ua: NEGATIVE
Nitrite: NEGATIVE
Protein, ur: 100 mg/dL — AB
Specific Gravity, Urine: 1.015 (ref 1.005–1.030)
pH: 5 (ref 5.0–8.0)

## 2020-08-21 LAB — RESPIRATORY PANEL BY RT PCR (FLU A&B, COVID)
Influenza A by PCR: NEGATIVE
Influenza B by PCR: NEGATIVE
SARS Coronavirus 2 by RT PCR: NEGATIVE

## 2020-08-21 LAB — APTT: aPTT: 43 seconds — ABNORMAL HIGH (ref 24–36)

## 2020-08-21 LAB — LIPASE, BLOOD: Lipase: 21 U/L (ref 11–51)

## 2020-08-21 LAB — LACTIC ACID, PLASMA
Lactic Acid, Venous: 1.3 mmol/L (ref 0.5–1.9)
Lactic Acid, Venous: 1.6 mmol/L (ref 0.5–1.9)

## 2020-08-21 LAB — MAGNESIUM: Magnesium: 1.8 mg/dL (ref 1.7–2.4)

## 2020-08-21 MED ORDER — METOPROLOL TARTRATE 25 MG PO TABS
25.0000 mg | ORAL_TABLET | Freq: Three times a day (TID) | ORAL | Status: DC
  Administered 2020-08-21 – 2020-08-25 (×4): 25 mg via ORAL
  Filled 2020-08-21 (×9): qty 1

## 2020-08-21 MED ORDER — POTASSIUM CHLORIDE 10 MEQ/100ML IV SOLN
10.0000 meq | Freq: Once | INTRAVENOUS | Status: AC
  Administered 2020-08-21: 10 meq via INTRAVENOUS
  Filled 2020-08-21: qty 100

## 2020-08-21 MED ORDER — LACTATED RINGERS IV BOLUS (SEPSIS)
500.0000 mL | Freq: Once | INTRAVENOUS | Status: AC
  Administered 2020-08-21: 500 mL via INTRAVENOUS

## 2020-08-21 MED ORDER — SCOPOLAMINE 1 MG/3DAYS TD PT72
1.0000 | MEDICATED_PATCH | TRANSDERMAL | Status: DC
  Administered 2020-08-22 – 2020-08-25 (×2): 1.5 mg via TRANSDERMAL
  Filled 2020-08-21 (×2): qty 1

## 2020-08-21 MED ORDER — ONDANSETRON HCL 4 MG PO TABS: 4.0000 mg | ORAL_TABLET | Freq: Four times a day (QID) | ORAL | Status: DC | PRN

## 2020-08-21 MED ORDER — DEXTROSE 10 % IV SOLN
INTRAVENOUS | Status: DC
  Filled 2020-08-21: qty 1000

## 2020-08-21 MED ORDER — CHLORHEXIDINE GLUCONATE 0.12 % MT SOLN
15.0000 mL | Freq: Two times a day (BID) | OROMUCOSAL | Status: DC
  Administered 2020-08-21 – 2020-08-27 (×12): 15 mL via OROMUCOSAL
  Filled 2020-08-21 (×12): qty 15

## 2020-08-21 MED ORDER — LACTATED RINGERS IV BOLUS
1000.0000 mL | Freq: Once | INTRAVENOUS | Status: AC
  Administered 2020-08-21: 1000 mL via INTRAVENOUS

## 2020-08-21 MED ORDER — CEFAZOLIN SODIUM-DEXTROSE 2-4 GM/100ML-% IV SOLN
2.0000 g | Freq: Three times a day (TID) | INTRAVENOUS | Status: DC
  Filled 2020-08-21: qty 100

## 2020-08-21 MED ORDER — SODIUM CHLORIDE 0.9% FLUSH: 10.0000 mL | Freq: Two times a day (BID) | INTRAVENOUS | Status: DC

## 2020-08-21 MED ORDER — ORAL CARE MOUTH RINSE
15.0000 mL | Freq: Two times a day (BID) | OROMUCOSAL | Status: DC
  Administered 2020-08-22 – 2020-08-27 (×10): 15 mL via OROMUCOSAL

## 2020-08-21 MED ORDER — ENOXAPARIN SODIUM 30 MG/0.3ML ~~LOC~~ SOLN
30.0000 mg | SUBCUTANEOUS | Status: DC
  Administered 2020-08-21 – 2020-08-26 (×6): 30 mg via SUBCUTANEOUS
  Filled 2020-08-21 (×6): qty 0.3

## 2020-08-21 MED ORDER — IOHEXOL 300 MG/ML  SOLN
100.0000 mL | Freq: Once | INTRAMUSCULAR | Status: AC | PRN
  Administered 2020-08-21: 100 mL via INTRAVENOUS

## 2020-08-21 MED ORDER — MAGNESIUM OXIDE 400 (241.3 MG) MG PO TABS
400.0000 mg | ORAL_TABLET | Freq: Three times a day (TID) | ORAL | Status: DC
  Administered 2020-08-21 – 2020-08-27 (×18): 400 mg via ORAL
  Filled 2020-08-21 (×22): qty 1

## 2020-08-21 MED ORDER — ACETAMINOPHEN 325 MG PO TABS
650.0000 mg | ORAL_TABLET | Freq: Four times a day (QID) | ORAL | Status: DC | PRN
  Administered 2020-08-21: 650 mg via ORAL
  Filled 2020-08-21: qty 2

## 2020-08-21 MED ORDER — ACETAMINOPHEN 650 MG RE SUPP: 650.0000 mg | Freq: Four times a day (QID) | RECTAL | Status: DC | PRN

## 2020-08-21 MED ORDER — LACTATED RINGERS IV SOLN: INTRAVENOUS | Status: AC

## 2020-08-21 MED ORDER — IPRATROPIUM-ALBUTEROL 0.5-2.5 (3) MG/3ML IN SOLN: 3.0000 mL | Freq: Four times a day (QID) | RESPIRATORY_TRACT | Status: DC | PRN

## 2020-08-21 MED ORDER — SODIUM CHLORIDE 0.9% FLUSH: 10.0000 mL | INTRAVENOUS | Status: DC | PRN

## 2020-08-21 MED ORDER — POLYETHYLENE GLYCOL 3350 17 G PO PACK
17.0000 g | PACK | Freq: Every day | ORAL | Status: DC | PRN
  Administered 2020-08-23: 17 g via ORAL
  Filled 2020-08-21: qty 1

## 2020-08-21 MED ORDER — CEFAZOLIN SODIUM-DEXTROSE 2-4 GM/100ML-% IV SOLN
2.0000 g | Freq: Once | INTRAVENOUS | Status: AC
  Administered 2020-08-21: 2 g via INTRAVENOUS
  Filled 2020-08-21: qty 100

## 2020-08-21 MED ORDER — SODIUM CHLORIDE 0.9% FLUSH
3.0000 mL | Freq: Two times a day (BID) | INTRAVENOUS | Status: DC
  Administered 2020-08-21 – 2020-08-26 (×7): 3 mL via INTRAVENOUS

## 2020-08-21 MED ORDER — ONDANSETRON HCL 4 MG/2ML IJ SOLN: 4.0000 mg | Freq: Four times a day (QID) | INTRAMUSCULAR | Status: DC | PRN

## 2020-08-21 MED ORDER — CHLORHEXIDINE GLUCONATE CLOTH 2 % EX PADS: 6.0000 | MEDICATED_PAD | Freq: Every day | CUTANEOUS | Status: DC

## 2020-08-21 MED ORDER — DEXTROSE 50 % IV SOLN
INTRAVENOUS | Status: AC
  Filled 2020-08-21: qty 50

## 2020-08-21 MED ORDER — PIPERACILLIN-TAZOBACTAM 3.375 G IVPB
3.3750 g | Freq: Three times a day (TID) | INTRAVENOUS | Status: DC
  Administered 2020-08-21 – 2020-08-22 (×3): 3.375 g via INTRAVENOUS
  Filled 2020-08-21 (×3): qty 50

## 2020-08-21 NOTE — Progress Notes (Signed)
  Echocardiogram 2D Echocardiogram has been performed.  Peter Hayes G Klyde Banka 08/21/2020, 3:30 PM

## 2020-08-21 NOTE — ED Provider Notes (Addendum)
[facility] DEPT Provider Note   CSN: [id number] Arrival date & time: 08/21/20  8469     History Chief Complaint  Patient presents with  . Altered Mental Status    Brought in by EMS from home blood sugar 59, AMS, ventilator dependent, given D510 prior to arrival through central line    Peter Hayes is a 24 y.o. male history of muscular dystrophy.  Patient brought in today by EMS today for concern of altered mental status.  Patient's father and primary caretaker noticed patient appeared confused and not responding normally this morning he checked the patient's blood sugar and noted to be 59.  He called EMS for this reason and patient was brought to the ER for evaluation.  Since that time patient's mental status has improved and he reports patient is no longer confused but does seem somewhat tired.  Patient currently on ventilator  Patient was seen in the ER yesterday for concern of fever of 102 F and abdominal discomfort/distention he had work-up performed and was concerning for possible pneumonia.  Patient was discharged on doxycycline.  Level 5 caveat with lethargy and ventilator. HPI     Past Medical History:  Diagnosis Date  . Acute respiratory failure with hypoxia and hypercapnia ([place]) 05/16/2018  . HCAP (healthcare-associated pneumonia) 12/01/2017  . High anion gap metabolic acidosis 04/06/5283  . Hypotension   . Ileus ([place]) 09/23/2017  . Leukocytosis 05/05/2016  . MD (muscular dystrophy) ([place])   . Refusal of blood product    patient is Peter Hayes witness  . Respiratory alkalosis 05/05/2016  . Sepsis ([place]) 05/05/2016  . Severe sepsis ([place]) 09/16/2019  . Sinus tachycardia 05/05/2016    Patient Active Problem List   Diagnosis Date Noted  . Sepsis ([place]) 06/14/2020  . Pneumomediastinum ([place]) 02/06/2020  . Pressure injury of skin 09/17/2019  . 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 Hypoglycemia without diagnosis of  diabetes mellitus 09/24/2017  . Muscular dystrophy ([place]) 05/05/2016  . Moderate protein-calorie malnutrition ([place]) 05/05/2016    Past Surgical History:  Procedure Laterality Date  . COLOSTOMY    . EYE SURGERY    . FLEXIBLE SIGMOIDOSCOPY N/A 01/10/2018   Procedure: FLEXIBLE SIGMOIDOSCOPY;  Surgeon: Laurence Spates, MD;  Location: [facility] ENDOSCOPY;  Service: Endoscopy;  Laterality: N/A;  . SMALL INTESTINE SURGERY         No family history on file.  Social History   Tobacco Use  . Smoking status: Never Smoker  . Smokeless tobacco: Never Used  Vaping Use  . Vaping Use: Never used  Substance Use Topics  . Alcohol use: No  . Drug use: No    Home Medications Prior to Admission medications   Medication Sig Start Date End Date Taking? Authorizing Provider  feeding supplement, ENSURE ENLIVE, (ENSURE ENLIVE) LIQD You can use whatever supplement he likes.  He needs about 1300 calories per day. 1.5 liters of fluid and 50 grams of protein per day. You can buy this at the grocery or drug store.  You do not need a prescription. 06/21/20  Yes Nita Sells, MD  guaiFENesin (ROBITUSSIN) 100 MG/5ML SOLN Take 15 mLs (300 mg total) by mouth every 6 (six) hours. 06/21/20  Yes Nita Sells, MD  ipratropium-albuterol (DUONEB) 0.5-2.5 (3) MG/3ML SOLN Take 3 mLs by nebulization every 6 (six) hours as needed. Patient taking differently: Take 3 mLs by nebulization every 6 (six) hours as needed (breathing / congestion).  09/21/19  Yes Marva Panda  E, MD  magnesium oxide (MAG-OX) 400 MG tablet Take 400 mg by mouth 3 (three) times daily.   Yes [provider]  metoprolol tartrate (LOPRESSOR) 25 MG tablet Take 25 mg by mouth every 8 (eight) hours. 08/09/20  Yes [provider]  ondansetron (ZOFRAN) 4 MG tablet Take 1 tablet (4 mg total) by mouth every 6 (six) hours as needed for nausea. Patient taking differently: Take 4 mg by mouth as needed for nausea.  06/21/20  Yes Nita Sells, MD  polyethylene glycol (MIRALAX / Floria Raveling) packet Follow package instruction for daily use.  You need to have one soft bowel movement per day.  If he is not doing this call your primary care doctor.  You also need to be sure he takes in 1.5 liters of fluid per day.   He needs protein supplement daily, and needs to take in about 1300 calories per day. Patient taking differently: Take 17 g by mouth daily. Follow package instruction for daily use.  You need to have one soft bowel movement per day.  If he is not doing this call your primary care doctor.  You also need to be sure he takes in 1.5 liters of fluid per day.   He needs protein supplement daily, and needs to take in about 1300 calories per day. 01/15/18  Yes Earnstine Regal, PA-C  scopolamine (TRANSDERM-SCOP) 1 MG/3DAYS Place 1 patch (1.5 mg total) onto the skin every 3 (three) days. 06/23/20  Yes Nita Sells, MD  sterile water SOLN with amino acids 10 % SOLN 1.3 g/kg, dextrose 70 % SOLN 20 % Inject 1,000 mLs into the vein continuous. TPN 1000 ml w/ lipids  Directions: Infuse TPN 1049m IV through PICC line via CADD SOLIS infusion pump daily over 18 hours with 1 hr ramp up and 1 hr ramp down. Add MVI 10 ml as directed to each bag prior to infusion. MIX WELL VTBI: 1000 ML Bag Volume  1050 ml     Dosing Weight for the patient 72 lbs   Yes [provider]  acetaminophen (TYLENOL) 325 MG tablet Take 2 tablets (650 mg total) by mouth every 6 (six) hours as needed for mild pain, moderate pain, fever or headache (or temp > 100). Patient not taking: Reported on 06/14/2020 01/15/18   JEarnstine Regal PA-C  doxycycline (VIBRAMYCIN) 100 MG capsule Take 1 capsule (100 mg total) by mouth 2 (two) times daily for 7 days. Patient not taking: Reported on 08/21/2020 08/20/20 08/27/20  LMargette Fast MD    Allergies    Fentanyl  Review of Systems   Review of Systems level 5 caveat  Physical Exam Updated Vital Signs BP  114/74   Pulse 86   Temp (!) 96.3 F (35.7 C) (Rectal)   Resp 12   Wt 40.8 kg   SpO2 100%   BMI 16.46 kg/m   Physical Exam Constitutional:      General: He is not in acute distress.    Appearance: Normal appearance. He is well-developed. He is not ill-appearing or diaphoretic.  HENT:     Head: Normocephalic and atraumatic.  Eyes:     General: Vision grossly intact. Gaze aligned appropriately.     Pupils: Pupils are equal, round, and reactive to light.  Neck:     Trachea: Trachea and phonation normal.  Cardiovascular:     Rate and Rhythm: Normal rate and regular rhythm.  Pulmonary:     Effort: No respiratory distress.  Comments: On ventilator NAD Abdominal:     General: There is no distension.     Palpations: Abdomen is soft.     Tenderness: There is no abdominal tenderness. There is no guarding or rebound.  Musculoskeletal:        General: Normal range of motion.     Cervical back: Normal range of motion.  Skin:    General: Skin is warm and dry.  Neurological:     Mental Status: He is alert. Mental status is at baseline.     Comments: At baseline per father     ED Results / Procedures / Treatments   Labs (all labs ordered are listed, but only abnormal results are displayed) Labs Reviewed  COMPREHENSIVE METABOLIC PANEL - Abnormal; Notable for the following components:      Result Value   Potassium 2.1 (*)    Glucose, Bld 160 (*)    BUN 22 (*)    Creatinine, Ser <0.30 (*)    Calcium 8.8 (*)    Total Protein 8.5 (*)    AST 1,897 (*)    ALT 1,573 (*)    Alkaline Phosphatase 292 (*)    Total Bilirubin 3.0 (*)    All other components within normal limits  CBC WITH DIFFERENTIAL/PLATELET - Abnormal; Notable for the following components:   Platelets 95 (*)    Lymphs Abs 0.1 (*)    All other components within normal limits  PROTIME-INR - Abnormal; Notable for the following components:   Prothrombin Time 19.2 (*)    INR 1.7 (*)    All other components within  normal limits  APTT - Abnormal; Notable for the following components:   aPTT 43 (*)    All other components within normal limits  CBG MONITORING, ED - Abnormal; Notable for the following components:   Glucose-Capillary 135 (*)    All other components within normal limits  RESPIRATORY PANEL BY RT PCR (FLU A&B, COVID)  URINE CULTURE  CULTURE, BLOOD (ROUTINE X 2)  CULTURE, BLOOD (ROUTINE X 2)  LACTIC ACID, PLASMA  MAGNESIUM  LIPASE, BLOOD  LACTIC ACID, PLASMA  URINALYSIS, ROUTINE W REFLEX MICROSCOPIC  CBG MONITORING, ED  CBG MONITORING, ED  CBG MONITORING, ED  CBG MONITORING, ED    EKG None  Radiology CT Chest W Contrast  Result Date: 08/21/2020 CLINICAL DATA:  Pneumothorax.  Altered mental status. EXAM: CT CHEST, ABDOMEN, AND PELVIS WITH CONTRAST TECHNIQUE: Multidetector CT imaging of the chest, abdomen and pelvis was performed following the standard protocol during bolus administration of intravenous contrast. CONTRAST:  165m OMNIPAQUE IOHEXOL 300 MG/ML  SOLN COMPARISON:  Abdomen/pelvis CT 06/15/2020.  CTA chest 05/16/2018. FINDINGS: CT CHEST FINDINGS Cardiovascular: The heart size is normal. No substantial pericardial effusion. No thoracic aortic aneurysm. Left PICC line tip is in the inferior right atrium. Mediastinum/Nodes: Stable appearance thymic remnant anterior mediastinum. No mediastinal lymphadenopathy. There is no hilar lymphadenopathy. The esophagus has normal imaging features. There is no axillary lymphadenopathy. Lungs/Pleura: Small left-sided pneumothorax noted. Small right pleural effusion is associated with mild nodular ground-glass opacity in the inferior right lower lobe. Musculoskeletal: Thoracolumbar scoliosis. Diffuse loss of muscle volume consistent with history of muscular dystrophy. No worrisome lytic or sclerotic osseous abnormality. CT ABDOMEN PELVIS FINDINGS Hepatobiliary: No suspicious focal abnormality within the liver parenchyma. There is no evidence for  gallstones, gallbladder wall thickening, or pericholecystic fluid. No intrahepatic or extrahepatic biliary dilation. Pancreas: No focal mass lesion. No dilatation of the main duct. No intraparenchymal cyst. No  peripancreatic edema. Spleen: No splenomegaly. No focal mass lesion. Adrenals/Urinary Tract: No adrenal nodule or mass. Tiny nonobstructing stone noted interpolar right kidney. Left kidney unremarkable. No evidence for hydroureter. The urinary bladder appears normal for the degree of distention. Stomach/Bowel: Stomach is unremarkable. No gastric wall thickening. No evidence of outlet obstruction. Diffuse gaseous distention of small bowel and colon noted with left abdominal end colostomy. The colon tracking to the stoma is decompressed but does contain some fluid raising the possibility of diarrhea. Vascular/Lymphatic: No abdominal aortic aneurysm. There is no gastrohepatic or hepatoduodenal ligament lymphadenopathy. No retroperitoneal or mesenteric lymphadenopathy. No pelvic sidewall lymphadenopathy. Reproductive: The prostate gland and seminal vesicles are unremarkable. Other: No intraperitoneal free fluid. Musculoskeletal: Bones are diffusely demineralized. No worrisome lytic or sclerotic osseous abnormality. IMPRESSION: 1. Small left-sided pneumothorax. 2. Small right pleural effusion associated with mild nodular ground-glass opacity in the inferior right lower lobe. Infectious/inflammatory alveolitis likely. 3. Diffuse gaseous distention of small bowel and colon with left abdominal end colostomy. No abrupt colonic transition zone and the colon tracking to the stoma is decompressed but contains some fluid raising the possibility of diarrhea. 4. Tiny nonobstructing right renal stone. Electronically Signed   By: Misty Stanley M.D.   On: 08/21/2020 10:36   CT ABDOMEN PELVIS W CONTRAST  Result Date: 08/21/2020 CLINICAL DATA:  Pneumothorax.  Altered mental status. EXAM: CT CHEST, ABDOMEN, AND PELVIS WITH  CONTRAST TECHNIQUE: Multidetector CT imaging of the chest, abdomen and pelvis was performed following the standard protocol during bolus administration of intravenous contrast. CONTRAST:  156m OMNIPAQUE IOHEXOL 300 MG/ML  SOLN COMPARISON:  Abdomen/pelvis CT 06/15/2020.  CTA chest 05/16/2018. FINDINGS: CT CHEST FINDINGS Cardiovascular: The heart size is normal. No substantial pericardial effusion. No thoracic aortic aneurysm. Left PICC line tip is in the inferior right atrium. Mediastinum/Nodes: Stable appearance thymic remnant anterior mediastinum. No mediastinal lymphadenopathy. There is no hilar lymphadenopathy. The esophagus has normal imaging features. There is no axillary lymphadenopathy. Lungs/Pleura: Small left-sided pneumothorax noted. Small right pleural effusion is associated with mild nodular ground-glass opacity in the inferior right lower lobe. Musculoskeletal: Thoracolumbar scoliosis. Diffuse loss of muscle volume consistent with history of muscular dystrophy. No worrisome lytic or sclerotic osseous abnormality. CT ABDOMEN PELVIS FINDINGS Hepatobiliary: No suspicious focal abnormality within the liver parenchyma. There is no evidence for gallstones, gallbladder wall thickening, or pericholecystic fluid. No intrahepatic or extrahepatic biliary dilation. Pancreas: No focal mass lesion. No dilatation of the main duct. No intraparenchymal cyst. No peripancreatic edema. Spleen: No splenomegaly. No focal mass lesion. Adrenals/Urinary Tract: No adrenal nodule or mass. Tiny nonobstructing stone noted interpolar right kidney. Left kidney unremarkable. No evidence for hydroureter. The urinary bladder appears normal for the degree of distention. Stomach/Bowel: Stomach is unremarkable. No gastric wall thickening. No evidence of outlet obstruction. Diffuse gaseous distention of small bowel and colon noted with left abdominal end colostomy. The colon tracking to the stoma is decompressed but does contain some fluid  raising the possibility of diarrhea. Vascular/Lymphatic: No abdominal aortic aneurysm. There is no gastrohepatic or hepatoduodenal ligament lymphadenopathy. No retroperitoneal or mesenteric lymphadenopathy. No pelvic sidewall lymphadenopathy. Reproductive: The prostate gland and seminal vesicles are unremarkable. Other: No intraperitoneal free fluid. Musculoskeletal: Bones are diffusely demineralized. No worrisome lytic or sclerotic osseous abnormality. IMPRESSION: 1. Small left-sided pneumothorax. 2. Small right pleural effusion associated with mild nodular ground-glass opacity in the inferior right lower lobe. Infectious/inflammatory alveolitis likely. 3. Diffuse gaseous distention of small bowel and colon with left abdominal end colostomy. No  abrupt colonic transition zone and the colon tracking to the stoma is decompressed but contains some fluid raising the possibility of diarrhea. 4. Tiny nonobstructing right renal stone. Electronically Signed   By: Misty Stanley M.D.   On: 08/21/2020 10:36   DG Chest Port 1 View  Result Date: 08/21/2020 CLINICAL DATA:  Hypoglycemia and altered mental status EXAM: PORTABLE CHEST 1 VIEW COMPARISON:  June 19, 2020 and Feb 08, 2020 FINDINGS: Apparent scarring right upper lobe is again noted. Lungs elsewhere are clear. Heart size and pulmonary vascularity are normal. No adenopathy. Central catheter tip is in the right atrium. There is a focal apical pneumothorax without tension component. No bone lesions. IMPRESSION: Left apical pneumothorax without tension component. Central catheter tip in right atrium. Apparent scarring right upper lobe. No consolidation or edema. Heart size normal. Critical Value/emergent results were called by telephone at the time of interpretation on 08/21/2020 at 8:37 am to provider [facility] , who verbally acknowledged these results. Electronically Signed   By: Lowella Grip III M.D.   On: 08/21/2020 08:37   DG Abdomen Acute  W/Chest  Result Date: 08/20/2020 CLINICAL DATA:  Fever, abdominal distension, nausea EXAM: DG ABDOMEN ACUTE WITH 1 VIEW CHEST COMPARISON:  08/27/2018 FINDINGS: Supine and upright frontal views of the abdomen as well as an upright frontal view of the chest are obtained. Left-sided PICC tip overlies superior vena cava. The cardiac silhouette is stable. Right suprahilar airspace disease compatible with bronchopneumonia. No effusion or pneumothorax. Diffuse gaseous distention of bowel without obstruction or ileus. No masses or abnormal calcifications. No free gas in the greater peritoneal sac. IMPRESSION: 1. Right suprahilar airspace disease consistent with bronchopneumonia. 2. Diffuse gaseous distension of large and small bowel, without signs of obstruction or ileus. Electronically Signed   By: Randa Ngo M.D.   On: 08/20/2020 15:56    Procedures .Critical Care Performed by: Deliah Boston, PA-C Authorized by: Deliah Boston, PA-C   Critical care provider statement:    Critical care time (minutes):  35   Critical care was time spent personally by me on the following activities:  Discussions with consultants, evaluation of patient's response to treatment, examination of patient, ordering and performing treatments and interventions, ordering and review of laboratory studies, ordering and review of radiographic studies, pulse oximetry, re-evaluation of patient's condition, obtaining history from patient or surrogate, review of old charts and development of treatment plan with patient or surrogate   (including critical care time)  Medications Ordered in ED Medications  potassium chloride 10 mEq in 100 mL IVPB (has no administration in time range)  lactated ringers bolus 500 mL (500 mLs Intravenous New Bag/Given 08/21/20 0958)  ceFAZolin (ANCEF) IVPB 2g/100 mL premix (0 g Intravenous Stopped 08/21/20 0859)  lactated ringers bolus 1,000 mL (0 mLs Intravenous Stopped 08/21/20 1037)   potassium chloride 10 mEq in 100 mL IVPB (10 mEq Intravenous New Bag/Given 08/21/20 0957)  iohexol (OMNIPAQUE) 300 MG/ML solution 100 mL (100 mLs Intravenous Contrast Given 08/21/20 0941)    ED Course  I have reviewed the triage vital signs and the nursing notes.  Pertinent labs & imaging results that were available during my care of the patient were reviewed by me and considered in my medical decision making (see chart for details).  Clinical Course as of Aug 21 1104  Sun Aug 21, 2020  1100 Dr. Neysa Bonito   [BM]    Clinical Course User Index [BM] Gari Crown   MDM Rules/Calculators/A&P  Additional history obtained from: 1. Nursing notes from this visit. 2. Review of electronic medical records.  Patient diagnosed with bronchopneumonia and abdominal distention yesterday.  He is started on doxycycline.  Had some gaseous distention of the abdomen but patient's father reports this has improved with flatulence. Covid test was negative.  Labs were reassuring.  Blood culture resulted with Staph aureus this morning and pharmacy recommends Ancef 2 g IV every 8 hours. ----------------------------- On initial evaluation this 24 year old male with history of muscular dystrophy presented with his father for altered mental status.  He was noted to be hypoglycemic which has improved following dextrose given by EMS.  On arrival CBG in the 90s.  On chart review there appears to be a positive blood culture taken from his PICC line.  He had a fever yesterday and was diagnosed yesterday with pneumonia.  Blood culture with staph aureus, will obtain sepsis labs and repeat chest x-ray and give Ancef per pharmacy recommendations. - Repeat chest x-ray shows small apical pneumothorax, patient is on his normal BiPAP which father reports patient cannot tolerate taking off.  I reviewed patient's chest x-ray yesterday and he did have an apical pneumothorax as well which does not appear  changed in size.  We will continue to monitor patient.  Discussed with Dr. Zenia Resides who agrees.  I then reassessed the patient he is resting comfortably in bed.  Patient reports he is feeling "better" since arrival.  Patient and his father updated on chest x-ray findings and they state understanding. - I ordered, reviewed and interpreted labs which include:  CBC without leukocytosis or anemia.  Thrombocytopenia of 95.  Is slightly worse than prior. CMP shows hypokalemia at 2.1.  New significant elevation of LFTs, AST of 1897 up from 110 yesterday.  ALT 1573 up from 173 yesterday.  Alk phos 292 up from 163 yesterday.  Total bilirubin 3.0 up from 1.2 yesterday. INR elevated at 1.7. aPTT elevated at 43. Lactic 1.3 is reassuring. Repeat CBG 135.  Given concerning LFT elevations lipase was added in addition to CT abdomen pelvis for assessment of possible biliary obstruction as cause of symptoms today.  Additionally CT of the chest was added for better evaluation of the pneumothorax.  2 runs IV potassium ordered for hypokalemia.  Magnesium level added. Case discussed with Dr. Zenia Resides. - Covid/influenza panel negative  CT Chest Abd Pelvis:  IMPRESSION:  1. Small left-sided pneumothorax.  2. Small right pleural effusion associated with mild nodular  ground-glass opacity in the inferior right lower lobe.  Infectious/inflammatory alveolitis likely.  3. Diffuse gaseous distention of small bowel and colon with left  abdominal end colostomy. No abrupt colonic transition zone and the  colon tracking to the stoma is decompressed but contains some fluid  raising the possibility of diarrhea.  4. Tiny nonobstructing right renal stone.  - Patient reassessed stable appearing no acute distress vital signs stable.  Patient and father agreeable for admission. - 11 AM: Consultation with hospitalist Dr. Neysa Bonito, patient accepted for admission.  Patient seen and evaluated by Dr. Zenia Resides during this visit.  Peter Hayes was evaluated in Emergency Department on 08/21/2020 for the symptoms described in the history of present illness. He was evaluated in the context of the global COVID-19 pandemic, which necessitated consideration that the patient might be at risk for infection with the SARS-CoV-2 virus that causes COVID-19. Institutional protocols and algorithms that pertain to the evaluation of patients at risk for COVID-19 are in a state of rapid change  based on information released by regulatory bodies including the CDC and federal and state organizations. These policies and algorithms were followed during the patient's care in the ED.   Note: Portions of this report may have been transcribed using voice recognition software. Every effort was made to ensure accuracy; however, inadvertent computerized transcription errors may still be present. Final Clinical Impression(s) / ED Diagnoses Final diagnoses:  Hypoglycemia  Bacteremia  Pneumothorax, unspecified type  Transaminitis  Hyperbilirubinemia  Hypokalemia    Rx / DC Orders ED Discharge Orders    None       Deliah Boston, PA-C 08/21/20 1105    Gari Crown 08/21/20 1358    Lacretia Leigh, MD 08/26/20 1359

## 2020-08-21 NOTE — Progress Notes (Signed)
Pharmacy Antibiotic Note  Peter Hayes is a 24 y.o. male admitted on 08/21/2020 with MSSA bacteremia & aspiration PNA.  Pharmacy has been consulted for zosyn dosing.  Pt on home TPN via PICC line. PICC to be pulled, repeat BCx ordered, TTE ordered.  Plan: Zosyn 3.375g IV q8h (4 hour infusion).  F/u renal function, WBC, temp, culture data  Weight: 40.8 kg (90 lb)  Temp (24hrs), Avg:99.2 F (37.3 C), Min:96.3 F (35.7 C), Max:102.2 F (39 C)  Recent Labs  Lab 08/20/20 1616 08/20/20 1848 08/21/20 0814  WBC 7.2  --  7.6  CREATININE <0.30*  --  <0.30*  LATICACIDVEN 0.6 0.6 1.3    CrCl cannot be calculated (This lab value cannot be used to calculate CrCl because it is not a number: <0.30).    Allergies  Allergen Reactions  . Fentanyl Other (See Comments)    Dizziness    Antimicrobials this admission: 11/14 ancef x 1 dose 11/14 zosyn>> Dose adjustments this admission:  Microbiology results: 11/14 Covid/flu neg 11/14 BCx 2: sent 11/13 BCx1 PICC: both bottles MSSA  Thank you for allowing pharmacy to be a part of this patient's care.  Eudelia Bunch, Pharm.D 08/21/2020 1:19 PM

## 2020-08-21 NOTE — ED Provider Notes (Signed)
Medical screening examination/treatment/procedure(s) were conducted as a shared visit with non-physician practitioner(s) and myself.  I personally evaluated the patient during the encounter.    24 year old presents with worsening altered mental status with known history of pneumonia.  Has a history of muscular dystrophy.  Here patient has pneumonia as well as a transaminitis as well as severe hypokalemia.  Will give IV potassium here, check abdominal CT and will be admitted   Lacretia Leigh, MD 08/21/20 [phone]

## 2020-08-21 NOTE — ED Notes (Signed)
Date and time results received: 08/21/20 9:10 AM  (use smartphrase ".now" to insert current time)  Test: K+  Critical Value: 2.1  Name of Provider Notified: Erlene Quan, [place]  Orders Received? Or Actions Taken?: Actions Taken: Notified PA Brandon

## 2020-08-21 NOTE — Progress Notes (Signed)
Checked in on pt. He is on home bipap with 2L o2 bled in. O2 saturations are 100%. IV nurse in room

## 2020-08-21 NOTE — Plan of Care (Signed)
  Problem: Nutrition: Goal: Adequate nutrition will be maintained Outcome: Progressing   

## 2020-08-21 NOTE — Progress Notes (Signed)
PHARMACY - PHYSICIAN COMMUNICATION CRITICAL VALUE ALERT - BLOOD CULTURE IDENTIFICATION (BCID)  Peter Hayes is an 24 y.o. male who presented to [facility] on 08/21/2020 with a chief complaint of fever.  Assessment: Patient reports fever 102F.  PMH significant for MD with PICC line for TPN.   Work-up for infection unremarkable.  Started on doxycycline for possible PNA.  Blood cx from [facility] now + MSSA.    Name of physician (or Provider) Contacted: [facility]  Current antibiotics: doxycycline 100mg  po BID  Changes to prescribed antibiotics recommended:  Ancef 2gm IV q8h  Results for orders placed or performed during the hospital encounter of 08/20/20  Blood Culture ID Panel (Reflexed) (Collected: 08/20/2020  4:10 PM)  Result Value Ref Range   Enterococcus faecalis NOT DETECTED NOT DETECTED   Enterococcus Faecium NOT DETECTED NOT DETECTED   Listeria monocytogenes NOT DETECTED NOT DETECTED   Staphylococcus species DETECTED (A) NOT DETECTED   Staphylococcus aureus (BCID) DETECTED (A) NOT DETECTED   Staphylococcus epidermidis NOT DETECTED NOT DETECTED   Staphylococcus lugdunensis NOT DETECTED NOT DETECTED   Streptococcus species NOT DETECTED NOT DETECTED   Streptococcus agalactiae NOT DETECTED NOT DETECTED   Streptococcus pneumoniae NOT DETECTED NOT DETECTED   Streptococcus pyogenes NOT DETECTED NOT DETECTED   A.calcoaceticus-baumannii NOT DETECTED NOT DETECTED   Bacteroides fragilis NOT DETECTED NOT DETECTED   Enterobacterales NOT DETECTED NOT DETECTED   Enterobacter cloacae complex NOT DETECTED NOT DETECTED   Escherichia coli NOT DETECTED NOT DETECTED   Klebsiella aerogenes NOT DETECTED NOT DETECTED   Klebsiella oxytoca NOT DETECTED NOT DETECTED   Klebsiella pneumoniae NOT DETECTED NOT DETECTED   Proteus species NOT DETECTED NOT DETECTED   Salmonella species NOT DETECTED NOT DETECTED   Serratia marcescens NOT DETECTED NOT DETECTED   Haemophilus influenzae NOT DETECTED NOT DETECTED    Neisseria meningitidis NOT DETECTED NOT DETECTED   Pseudomonas aeruginosa NOT DETECTED NOT DETECTED   Stenotrophomonas maltophilia NOT DETECTED NOT DETECTED   Candida albicans NOT DETECTED NOT DETECTED   Candida auris NOT DETECTED NOT DETECTED   Candida glabrata NOT DETECTED NOT DETECTED   Candida krusei NOT DETECTED NOT DETECTED   Candida parapsilosis NOT DETECTED NOT DETECTED   Candida tropicalis NOT DETECTED NOT DETECTED   Cryptococcus neoformans/gattii NOT DETECTED NOT DETECTED   Meth resistant mecA/C and MREJ NOT DETECTED NOT DETECTED    Netta Cedars PharmD 08/21/2020  6:33 AM

## 2020-08-21 NOTE — Progress Notes (Signed)
A consult was placed to the IV Therapist to dc picc line and place midline;  Picc line was placed outside of [place];  Attempted x1 to place a midline in the RUA; able to access vein, but unable to thread guidewire; pt's father stated "they tried to place the picc in that arm but couldn't get it to go; "  No further attempts made;  Pt unable to extend his arms;  Picc line remains in place with IV Fluids infusing;  Father is very concerned that the pt needs to continue to get his TNA, and has questions and concerns about removing the picc line.  Abby, RN aware of the concerns.

## 2020-08-21 NOTE — Consult Note (Addendum)
[facility]    Date of Admission:  08/21/2020   Total days of antibiotics: 1 ancef               Reason for Consult: Staph bacteremia  Referring Provider: CHAMP!    Assessment: Staph bacteremia 2/2 bottles from [facility] Hepatitis Hypokalemia Primary to replete Muscular dystrophy  Plan: 1. Repeat BCx sent 2. Narrow anbx to ancef (though will not cover his bronchopneumonia) 3. Pull pic 4. Check TTE 5. Consider RUQ u/s  Thank you so much for this interesting consult,  Active Problems:   * No active hospital problems. *     HPI: Peter Hayes is a 24 y.o. male with hx of muscular dystrophy, comes to ED with abd pain, distention and fever (102). He is on home TPN and has a PIC.  He is on home O2 (2L) and his breathing is unchanged from baseline.  In ED he was found to have bronchopneumonia on plain CXR. He is also found to have MSSA bacteremia, Cx drawn from [facility].  His father notes that he has had a chronic cough for several months.   Review of Systems: Review of Systems  Respiratory: Positive for cough.   Gastrointestinal: Positive for abdominal pain. Negative for constipation and diarrhea.  father notes that he has no bed sores.  Please see HPI. All other systems reviewed and negative.   Past Medical History:  Diagnosis Date  . Acute respiratory failure with hypoxia and hypercapnia ([place]) 05/16/2018  . HCAP (healthcare-associated pneumonia) 12/01/2017  . High anion gap metabolic acidosis 05/19/7516  . Hypotension   . Ileus ([place]) 09/23/2017  . Leukocytosis 05/05/2016  . MD (muscular dystrophy) ([place])   . Refusal of blood product    [name]  . Respiratory alkalosis 05/05/2016  . Sepsis ([place]) 05/05/2016  . Severe sepsis ([place]) 09/16/2019  . Sinus tachycardia 05/05/2016    Social History   Tobacco Use  . Smoking status: Never Smoker  . Smokeless tobacco: Never Used  Vaping Use  . Vaping Use: Never used  Substance Use  Topics  . Alcohol use: No  . Drug use: No    No family history on file.   Medications: Scheduled:   Abtx:  Anti-infectives (From admission, onward)   Start     Dose/Rate Route Frequency Ordered Stop   08/21/20 0700  ceFAZolin (ANCEF) IVPB 2g/100 mL premix        2 g 200 mL/hr over 30 Minutes Intravenous  Once 08/21/20 0655 08/21/20 0859        OBJECTIVE: Blood pressure 117/68, pulse 93, temperature (!) 96.3 F (35.7 C), temperature source Rectal, resp. rate (!) 30, weight 40.8 kg, SpO2 100 %.  Physical Exam Vitals reviewed.  Constitutional:      General: He is not in acute distress.    Appearance: He is not ill-appearing.  HENT:     Mouth/Throat:     Mouth: Mucous membranes are dry.     Pharynx: Oropharynx is clear. No oropharyngeal exudate.  Eyes:     Extraocular Movements: Extraocular movements intact.  Cardiovascular:     Rate and Rhythm: Normal rate and regular rhythm.  Pulmonary:     Effort: Pulmonary effort is normal.     Comments: Decreased breath sounds Abdominal:     General: There is distension.     Palpations: Abdomen is soft.     Tenderness: There is no abdominal tenderness.  There is no guarding.  Musculoskeletal:        General: Deformity present. No swelling.       Arms:     Comments: No sores on feet  Neurological:     Mental Status: He is alert.     Lab Results Results for orders placed or performed during the hospital encounter of 08/21/20 (from the past 48 hour(s))  CBG monitoring, ED     Status: None   Collection Time: 08/21/20  6:38 AM  Result Value Ref Range   Glucose-Capillary 98 70 - 99 mg/dL    Comment: Glucose reference range applies only to samples taken after fasting for at least 8 hours.  Lactic acid, plasma     Status: None   Collection Time: 08/21/20  8:14 AM  Result Value Ref Range   Lactic Acid, Venous 1.3 0.5 - 1.9 mmol/L    Comment: Performed at [facility], [place] [address], [place], [place]  [address]  Comprehensive metabolic panel     Status: Abnormal   Collection Time: 08/21/20  8:14 AM  Result Value Ref Range   Sodium 136 135 - 145 mmol/L   Potassium 2.1 (LL) 3.5 - 5.1 mmol/L    Comment: CRITICAL RESULT CALLED TO, READ BACK BY AND VERIFIED WITH: S,CLAPP AT 0855 ON 08/21/20 BY A,MOHAMED    Chloride 101 98 - 111 mmol/L   CO2 27 22 - 32 mmol/L   Glucose, Bld 160 (H) 70 - 99 mg/dL    Comment: Glucose reference range applies only to samples taken after fasting for at least 8 hours.   BUN 22 (H) 6 - 20 mg/dL   Creatinine, Ser <0.30 (L) 0.61 - 1.24 mg/dL   Calcium 8.8 (L) 8.9 - 10.3 mg/dL   Total Protein 8.5 (H) 6.5 - 8.1 g/dL   Albumin 4.2 3.5 - 5.0 g/dL   AST 1,897 (H) 15 - 41 U/L   ALT 1,573 (H) 0 - 44 U/L   Alkaline Phosphatase 292 (H) 38 - 126 U/L   Total Bilirubin 3.0 (H) 0.3 - 1.2 mg/dL   GFR, Estimated NOT CALCULATED >60 mL/min    Comment: (NOTE) Calculated using the CKD-EPI Creatinine Equation (2021)    Anion gap 8 5 - 15    Comment: Performed at [facility], [place] [address], [place], [place] [address]  CBC WITH DIFFERENTIAL     Status: Abnormal   Collection Time: 08/21/20  8:14 AM  Result Value Ref Range   WBC 7.6 4.0 - 10.5 K/uL   RBC 4.84 4.22 - 5.81 MIL/uL   Hemoglobin 13.0 13.0 - 17.0 g/dL   HCT 41.0 39 - 52 %   MCV 84.7 80.0 - 100.0 fL   MCH 26.9 26.0 - 34.0 pg   MCHC 31.7 30.0 - 36.0 g/dL   RDW 15.0 11.5 - 15.5 %   Platelets 95 (L) 150 - 400 K/uL    Comment: PLATELET COUNT CONFIRMED BY SMEAR SPECIMEN CHECKED FOR CLOTS Immature Platelet Fraction may be clinically indicated, consider ordering this additional test [id number]    nRBC 0.0 0.0 - 0.2 %   Neutrophils Relative % 96 %   Neutro Abs 7.3 1.7 - 7.7 K/uL   Lymphocytes Relative 2 %   Lymphs Abs 0.1 (L) 0.7 - 4.0 K/uL   Monocytes Relative 1 %   Monocytes Absolute 0.1 0.1 - 1.0 K/uL   Eosinophils Relative 0 %   Eosinophils Absolute 0.0 0.0 - 0.5 K/uL  Basophils Relative 0 %    Basophils Absolute 0.0 0.0 - 0.1 K/uL   Immature Granulocytes 1 %   Abs Immature Granulocytes 0.04 0.00 - 0.07 K/uL    Comment: Performed at [facility], [place] [address], [place], [place] [address]  Protime-INR     Status: Abnormal   Collection Time: 08/21/20  8:14 AM  Result Value Ref Range   Prothrombin Time 19.2 (H) 11.4 - 15.2 seconds   INR 1.7 (H) 0.8 - 1.2    Comment: (NOTE) INR goal varies based on device and disease states. Performed at [facility], [place] [address] [place], [place] [address]   APTT     Status: Abnormal   Collection Time: 08/21/20  8:14 AM  Result Value Ref Range   aPTT 43 (H) 24 - 36 seconds    Comment:        IF BASELINE aPTT IS ELEVATED, SUGGEST PATIENT RISK ASSESSMENT BE USED TO DETERMINE APPROPRIATE ANTICOAGULANT THERAPY. Performed at [facility], [place] [address], [place], [place] [address]   POC CBG, ED     Status: Abnormal   Collection Time: 08/21/20  8:42 AM  Result Value Ref Range   Glucose-Capillary 135 (H) 70 - 99 mg/dL    Comment: Glucose reference range applies only to samples taken after fasting for at least 8 hours.  Magnesium     Status: None   Collection Time: 08/21/20  9:22 AM  Result Value Ref Range   Magnesium 1.8 1.7 - 2.4 mg/dL    Comment: Performed at [facility], [place] [address], [place], [place] [address]  Lipase, blood     Status: None   Collection Time: 08/21/20  9:22 AM  Result Value Ref Range   Lipase 21 11 - 51 U/L    Comment: Performed at [facility], [place] [address] [place], [place] [address]      Component Value Date/Time   SDES  08/20/2020 1610    BLOOD LEFT ARM UPPER PICC Performed at [facility], [address] [place], [place] [address]    [facility]  08/20/2020 1610    BOTTLES DRAWN AEROBIC AND ANAEROBIC Blood Culture adequate volume Performed at [facility], [address]  [place], [place] [address]    CULT  08/20/2020 1610    NO GROWTH < 24 HOURS Performed at [facility], [place] [address], [place], [place] [address]    REPTSTATUS PENDING 08/20/2020 1610   DG Chest Port 1 View  Result Date: 08/21/2020 CLINICAL DATA:  Hypoglycemia and altered mental status EXAM: PORTABLE CHEST 1 VIEW COMPARISON:  June 19, 2020 and Feb 08, 2020 FINDINGS: Apparent scarring right upper lobe is again noted. Lungs elsewhere are clear. Heart size and pulmonary vascularity are normal. No adenopathy. Central catheter tip is in the right atrium. There is a focal apical pneumothorax without tension component. No bone lesions. IMPRESSION: Left apical pneumothorax without tension component. Central catheter tip in right atrium. Apparent scarring right upper lobe. No consolidation or edema. Heart size normal. Critical Value/emergent results were called by telephone at the time of interpretation on 08/21/2020 at 8:37 am to provider [facility] , who verbally acknowledged these results. Electronically Signed   By: Lowella Grip III M.D.   On: 08/21/2020 08:37   DG Abdomen Acute W/Chest  Result Date: 08/20/2020 CLINICAL DATA:  Fever, abdominal distension, nausea EXAM: DG ABDOMEN ACUTE WITH 1 VIEW  CHEST COMPARISON:  08/27/2018 FINDINGS: Supine and upright frontal views of the abdomen as well as an upright frontal view of the chest are obtained. Left-sided PICC tip overlies superior vena cava. The cardiac silhouette is stable. Right suprahilar airspace disease compatible with bronchopneumonia. No effusion or pneumothorax. Diffuse gaseous distention of bowel without obstruction or ileus. No masses or abnormal calcifications. No free gas in the greater peritoneal sac. IMPRESSION: 1. Right suprahilar airspace disease consistent with bronchopneumonia. 2. Diffuse gaseous distension of large and small bowel, without signs of obstruction or ileus. Electronically Signed   By: Randa Ngo M.D.   On:  08/20/2020 15:56   Recent Results (from the past 240 hour(s))  Culture, blood (routine x 2)     Status: None (Preliminary result)   Collection Time: 08/20/20  4:10 PM   Specimen: BLOOD LEFT ARM  Result Value Ref Range Status   Specimen Description   Final    BLOOD LEFT ARM UPPER PICC Performed at [facility], [address] [place], [place] [address]    Special Requests   Final    BOTTLES DRAWN AEROBIC AND ANAEROBIC Blood Culture adequate volume Performed at [facility], [address] [place], [place] [address]    Culture  Setup Time   Final    GRAM POSITIVE COCCI IN BOTH AEROBIC AND ANAEROBIC BOTTLES Organism ID to follow CRITICAL RESULT CALLED TO, READ BACK BY AND VERIFIED WITH: PHRMD M LILLISON $RemoveBe'[email or web address]'[id number]$  08/21/20 BY S GEZAHEGN    Culture   Final    NO GROWTH < 24 HOURS Performed at [facility], [place] [address] [place], [place] [address]    Report Status PENDING  Incomplete  Blood Culture ID Panel (Reflexed)     Status: Abnormal   Collection Time: 08/20/20  4:10 PM  Result Value Ref Range Status   Enterococcus faecalis NOT DETECTED NOT DETECTED Final   Enterococcus Faecium NOT DETECTED NOT DETECTED Final   Listeria monocytogenes NOT DETECTED NOT DETECTED Final   Staphylococcus species DETECTED (A) NOT DETECTED Final    Comment: CRITICAL RESULT CALLED TO, READ BACK BY AND VERIFIED WITH: PHRMD M LILLISON $RemoveBe'@0626'[id number]$  08/21/20 BY S GEZAHEGN    Staphylococcus aureus (BCID) DETECTED (A) NOT DETECTED Final    Comment: CRITICAL RESULT CALLED TO, READ BACK BY AND VERIFIED WITH: PHRMD M LILLISON $RemoveBe'@0626'[id number]$  08/21/20 BY S GEZAHEGN    Staphylococcus epidermidis NOT DETECTED NOT DETECTED Final   Staphylococcus lugdunensis NOT DETECTED NOT DETECTED Final   Streptococcus species NOT DETECTED NOT DETECTED Final   Streptococcus agalactiae NOT DETECTED NOT DETECTED Final   Streptococcus pneumoniae NOT DETECTED NOT DETECTED Final   Streptococcus pyogenes NOT DETECTED NOT  DETECTED Final   A.calcoaceticus-baumannii NOT DETECTED NOT DETECTED Final   Bacteroides fragilis NOT DETECTED NOT DETECTED Final   Enterobacterales NOT DETECTED NOT DETECTED Final   Enterobacter cloacae complex NOT DETECTED NOT DETECTED Final   Escherichia coli NOT DETECTED NOT DETECTED Final   Klebsiella aerogenes NOT DETECTED NOT DETECTED Final   Klebsiella oxytoca NOT DETECTED NOT DETECTED Final   Klebsiella pneumoniae NOT DETECTED NOT DETECTED Final   Proteus species NOT DETECTED NOT DETECTED Final   Salmonella species NOT DETECTED NOT DETECTED Final   Serratia marcescens NOT DETECTED NOT DETECTED Final   Haemophilus influenzae NOT DETECTED NOT DETECTED Final   Neisseria meningitidis NOT DETECTED NOT DETECTED Final   Pseudomonas aeruginosa NOT DETECTED NOT DETECTED Final   Stenotrophomonas maltophilia NOT DETECTED NOT  DETECTED Final   Candida albicans NOT DETECTED NOT DETECTED Final   Candida auris NOT DETECTED NOT DETECTED Final   Candida glabrata NOT DETECTED NOT DETECTED Final   Candida krusei NOT DETECTED NOT DETECTED Final   Candida parapsilosis NOT DETECTED NOT DETECTED Final   Candida tropicalis NOT DETECTED NOT DETECTED Final   Cryptococcus neoformans/gattii NOT DETECTED NOT DETECTED Final   Meth resistant mecA/C and MREJ NOT DETECTED NOT DETECTED Final    Comment: Performed at [facility], [address], [place], [place] [address]  Respiratory Panel by RT PCR (Flu A&B, Covid) - Nasopharyngeal Swab     Status: None   Collection Time: 08/20/20  4:16 PM   Specimen: Nasopharyngeal Swab  Result Value Ref Range Status   SARS Coronavirus 2 by RT PCR NEGATIVE NEGATIVE Final    Comment: (NOTE) SARS-CoV-2 target nucleic acids are NOT DETECTED.  The SARS-CoV-2 RNA is generally detectable in upper respiratoy specimens during the acute phase of infection. The lowest concentration of SARS-CoV-2 viral copies this assay can detect is 131 copies/mL. A negative result does not  preclude SARS-Cov-2 infection and should not be used as the sole basis for treatment or other patient management decisions. A negative result may occur with  improper specimen collection/handling, submission of specimen other than nasopharyngeal swab, presence of viral mutation(s) within the areas targeted by this assay, and inadequate number of viral copies (<131 copies/mL). A negative result must be combined with clinical observations, patient history, and epidemiological information. The expected result is Negative.  Fact Sheet for Patients:  https://www.moore.com/  Fact Sheet for Healthcare Providers:  https://www.young.biz/  This test is no t yet approved or cleared by the [place] FDA and  has been authorized for detection and/or diagnosis of SARS-CoV-2 by FDA under an Emergency Use Authorization (EUA). This EUA will remain  in effect (meaning this test can be used) for the duration of the COVID-19 declaration under Section 564(b)(1) of the Act, 21 U.S.C. section 360bbb-3(b)(1), unless the authorization is terminated or revoked sooner.     Influenza A by PCR NEGATIVE NEGATIVE Final   Influenza B by PCR NEGATIVE NEGATIVE Final    Comment: (NOTE) The Xpert Xpress SARS-CoV-2/FLU/RSV assay is intended as an aid in  the diagnosis of influenza from Nasopharyngeal swab specimens and  should not be used as a sole basis for treatment. Nasal washings and  aspirates are unacceptable for Xpert Xpress SARS-CoV-2/FLU/RSV  testing.  Fact Sheet for Patients: https://www.moore.com/  Fact Sheet for Healthcare Providers: https://www.young.biz/  This test is not yet approved or cleared by the [place] FDA and  has been authorized for detection and/or diagnosis of SARS-CoV-2 by  FDA under an Emergency Use Authorization (EUA). This EUA will remain  in effect (meaning this test can be used) for the  duration of the  Covid-19 declaration under Section 564(b)(1) of the Act, 21  U.S.C. section 360bbb-3(b)(1), unless the authorization is  terminated or revoked. Performed at [facility], [address] [place], [place] [address]     Microbiology: Recent Results (from the past 240 hour(s))  Culture, blood (routine x 2)     Status: None (Preliminary result)   Collection Time: 08/20/20  4:10 PM   Specimen: BLOOD LEFT ARM  Result Value Ref Range Status   Specimen Description   Final    BLOOD LEFT ARM UPPER PICC Performed at [facility], [address], [place], [place] [address]  Special Requests   Final    BOTTLES DRAWN AEROBIC AND ANAEROBIC Blood Culture adequate volume Performed at [facility], [address] [place], [place] [address]    Culture  Setup Time   Final    GRAM POSITIVE COCCI IN BOTH AEROBIC AND ANAEROBIC BOTTLES Organism ID to follow CRITICAL RESULT CALLED TO, READ BACK BY AND VERIFIED WITH: PHRMD M LILLISON $RemoveBe'[email or web address]'[id number]$  08/21/20 BY S GEZAHEGN    Culture   Final    NO GROWTH < 24 HOURS Performed at [facility], [address], [place], [place] [address]    Report Status PENDING  Incomplete  Blood Culture ID Panel (Reflexed)     Status: Abnormal   Collection Time: 08/20/20  4:10 PM  Result Value Ref Range Status   Enterococcus faecalis NOT DETECTED NOT DETECTED Final   Enterococcus Faecium NOT DETECTED NOT DETECTED Final   Listeria monocytogenes NOT DETECTED NOT DETECTED Final   Staphylococcus species DETECTED (A) NOT DETECTED Final    Comment: CRITICAL RESULT CALLED TO, READ BACK BY AND VERIFIED WITH: PHRMD M LILLISON $RemoveBe'@0626'[id number]$  08/21/20 BY S GEZAHEGN    Staphylococcus aureus (BCID) DETECTED (A) NOT DETECTED Final    Comment: CRITICAL RESULT CALLED TO, READ BACK BY AND VERIFIED WITH: PHRMD M LILLISON $RemoveBe'@0626'[id number]$  08/21/20 BY S GEZAHEGN    Staphylococcus epidermidis NOT DETECTED NOT DETECTED Final   Staphylococcus lugdunensis  NOT DETECTED NOT DETECTED Final   Streptococcus species NOT DETECTED NOT DETECTED Final   Streptococcus agalactiae NOT DETECTED NOT DETECTED Final   Streptococcus pneumoniae NOT DETECTED NOT DETECTED Final   Streptococcus pyogenes NOT DETECTED NOT DETECTED Final   A.calcoaceticus-baumannii NOT DETECTED NOT DETECTED Final   Bacteroides fragilis NOT DETECTED NOT DETECTED Final   Enterobacterales NOT DETECTED NOT DETECTED Final   Enterobacter cloacae complex NOT DETECTED NOT DETECTED Final   Escherichia coli NOT DETECTED NOT DETECTED Final   Klebsiella aerogenes NOT DETECTED NOT DETECTED Final   Klebsiella oxytoca NOT DETECTED NOT DETECTED Final   Klebsiella pneumoniae NOT DETECTED NOT DETECTED Final   Proteus species NOT DETECTED NOT DETECTED Final   Salmonella species NOT DETECTED NOT DETECTED Final   Serratia marcescens NOT DETECTED NOT DETECTED Final   Haemophilus influenzae NOT DETECTED NOT DETECTED Final   Neisseria meningitidis NOT DETECTED NOT DETECTED Final   Pseudomonas aeruginosa NOT DETECTED NOT DETECTED Final   Stenotrophomonas maltophilia NOT DETECTED NOT DETECTED Final   Candida albicans NOT DETECTED NOT DETECTED Final   Candida auris NOT DETECTED NOT DETECTED Final   Candida glabrata NOT DETECTED NOT DETECTED Final   Candida krusei NOT DETECTED NOT DETECTED Final   Candida parapsilosis NOT DETECTED NOT DETECTED Final   Candida tropicalis NOT DETECTED NOT DETECTED Final   Cryptococcus neoformans/gattii NOT DETECTED NOT DETECTED Final   Meth resistant mecA/C and MREJ NOT DETECTED NOT DETECTED Final    Comment: Performed at [facility], [address] [place], [place] [address]  Respiratory Panel by RT PCR (Flu A&B, Covid) - Nasopharyngeal Swab     Status: None   Collection Time: 08/20/20  4:16 PM   Specimen: Nasopharyngeal Swab  Result Value Ref Range Status   SARS Coronavirus 2 by RT PCR NEGATIVE NEGATIVE Final    Comment: (NOTE) SARS-CoV-2 target nucleic acids  are NOT DETECTED.  The SARS-CoV-2 RNA is generally detectable in upper respiratoy specimens during the acute phase of infection. The lowest concentration of SARS-CoV-2 viral copies this assay can detect is  131 copies/mL. A negative result does not preclude SARS-Cov-2 infection and should not be used as the sole basis for treatment or other patient management decisions. A negative result may occur with  improper specimen collection/handling, submission of specimen other than nasopharyngeal swab, presence of viral mutation(s) within the areas targeted by this assay, and inadequate number of viral copies (<131 copies/mL). A negative result must be combined with clinical observations, patient history, and epidemiological information. The expected result is Negative.  Fact Sheet for Patients:  PinkCheek.be  Fact Sheet for Healthcare Providers:  GravelBags.it  This test is no t yet approved or cleared by the [place] FDA and  has been authorized for detection and/or diagnosis of SARS-CoV-2 by FDA under an Emergency Use Authorization (EUA). This EUA will remain  in effect (meaning this test can be used) for the duration of the COVID-19 declaration under Section 564(b)(1) of the Act, 21 U.S.C. section 360bbb-3(b)(1), unless the authorization is terminated or revoked sooner.     Influenza A by PCR NEGATIVE NEGATIVE Final   Influenza B by PCR NEGATIVE NEGATIVE Final    Comment: (NOTE) The Xpert Xpress SARS-CoV-2/FLU/RSV assay is intended as an aid in  the diagnosis of influenza from Nasopharyngeal swab specimens and  should not be used as a sole basis for treatment. Nasal washings and  aspirates are unacceptable for Xpert Xpress SARS-CoV-2/FLU/RSV  testing.  Fact Sheet for Patients: PinkCheek.be  Fact Sheet for Healthcare Providers: GravelBags.it  This test is not  yet approved or cleared by the [place] FDA and  has been authorized for detection and/or diagnosis of SARS-CoV-2 by  FDA under an Emergency Use Authorization (EUA). This EUA will remain  in effect (meaning this test can be used) for the duration of the  Covid-19 declaration under Section 564(b)(1) of the Act, 21  U.S.C. section 360bbb-3(b)(1), unless the authorization is  terminated or revoked. Performed at [facility], [address] [place], [place] [address]     Radiographs and labs were personally reviewed by me.   Bobby Rumpf, MD [facility] [phone] 08/21/2020, 10:13 AM

## 2020-08-21 NOTE — ED Notes (Signed)
Manuela Schwartz, RN tried to start ultrasound IV x2. Unsuccessful both attempts but was able to get a blue top blood culture.

## 2020-08-21 NOTE — H&P (Signed)
History and Physical        Hospital Admission Note Date: 08/21/2020  Patient name: Peter Hayes Medical record number: [id number] Date of birth: Apr 29, 1996 Age: 24 y.o. Gender: male  PCP: Penni Bombard, PA  Patient coming from: home Lives with: father At baseline, ambulates: nonabmbulatory  Chief Complaint    Chief Complaint  Patient presents with  . 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 He was seen in the ED yesterday for a fever of 102F at home and abdominal discomfort with mild distension with nausea and tachycardia in the ED but missed his home metoprolol. His vitals improved with IV fluids and fever reduction. CXR yesterday showed possible bronchopneumonia and blood cultures were taken from his PICC line. He was discharged with doxycycline with strict outpatient follow up. Today, his father, who is his primary caretaker, noticed that he appeared confused and not responding normally and checked his blood glucose which was 59 and called EMS. He was given dextrose by EMS with improvement. On further chart review per ED provider today, the patient was found to have Staph aureus positive blood cultures from yesterday via PICC line and was started on Ancef 2g IV per pharmacy recommendations.    ED Course: Hypothermic (T 96.48F), tachycardic, tachypnic, normotensive on home BiPAP. Notable Labs: Na 136, K 2.1, Glucose 160, BUN 22, Cr 0.3, Mg 1.8, Alk phos 292, AST 1897, ALT 1573, T bili 3.0,  Lactic acid 1.3, WBC 7.6, Hb 13.0, Platelets 95, INR 1.7. Notable Imaging: CXR with Left apical pneumothorax without consolidation or edema. CT chest/abd/pelv W contrast: small left sided pneumothorax, small right pleural effusion with mild nodular ground glass opacity in the inferior right lower lobe (infectious vs. Inflammatory alveolitis), diffuse gaseous distension of small bowel and colon with colostomy without colonic transition zone. Patient received 1.5 L LR bolus, Ancef and potassium IV. ID was consulted for Staph bacteremia.   Vitals:   08/21/20 1130 08/21/20 1200  BP: 103/66 104/63  Pulse: 85 (!) 101  Resp: 18 (!) 22  Temp:    SpO2: 100% 100%     Review of Systems:  Review of Systems  Respiratory: Negative for shortness of breath.   Gastrointestinal: Negative for abdominal pain and diarrhea.  Musculoskeletal: Negative.   All other systems reviewed and are negative.   Medical/Social/Family History   Past Medical History: Past Medical History:  Diagnosis Date  . Acute respiratory failure with hypoxia and hypercapnia ([place]) 05/16/2018  . HCAP (healthcare-associated pneumonia) 12/01/2017  . High anion gap metabolic acidosis 7/35/6701  . Hypotension   . Ileus ([place]) 09/23/2017  . Leukocytosis 05/05/2016  . MD (muscular dystrophy) ([place])   . Refusal of blood product    [name]  . Respiratory alkalosis 05/05/2016  .  Sepsis ([place]) 05/05/2016  . Severe sepsis ([place]) 09/16/2019  . Sinus tachycardia 05/05/2016    Past Surgical History:  Procedure Laterality Date  . COLOSTOMY    . EYE SURGERY    . FLEXIBLE SIGMOIDOSCOPY N/A 01/10/2018   Procedure: FLEXIBLE SIGMOIDOSCOPY;  Surgeon: Laurence Spates, MD;  Location: [facility];  Service: Endoscopy;  Laterality: N/A;  . SMALL INTESTINE SURGERY      Medications: Prior to Admission medications   Medication Sig Start Date End Date Taking? Authorizing Provider  feeding supplement, ENSURE ENLIVE, (ENSURE ENLIVE) LIQD  You can use whatever supplement he likes.  He needs about 1300 calories per day. 1.5 liters of fluid and 50 grams of protein per day. You can buy this at the grocery or drug store.  You do not need a prescription. 06/21/20  Yes Nita Sells, MD  guaiFENesin (ROBITUSSIN) 100 MG/5ML SOLN Take 15 mLs (300 mg total) by mouth every 6 (six) hours. 06/21/20  Yes Nita Sells, MD  ipratropium-albuterol (DUONEB) 0.5-2.5 (3) MG/3ML SOLN Take 3 mLs by nebulization every 6 (six) hours as needed. Patient taking differently: Take 3 mLs by nebulization every 6 (six) hours as needed (breathing / congestion).  09/21/19  Yes Harold Hedge, MD  magnesium oxide (MAG-OX) 400 MG tablet Take 400 mg by mouth 3 (three) times daily.   Yes [provider]  metoprolol tartrate (LOPRESSOR) 25 MG tablet Take 25 mg by mouth every 8 (eight) hours. 08/09/20  Yes [provider]  ondansetron (ZOFRAN) 4 MG tablet Take 1 tablet (4 mg total) by mouth every 6 (six) hours as needed for nausea. Patient taking differently: Take 4 mg by mouth as needed for nausea.  06/21/20  Yes Nita Sells, MD  polyethylene glycol (MIRALAX / Floria Raveling) packet Follow package instruction for daily use.  You need to have one soft bowel movement per day.  If he is not doing this call your primary care doctor.  You also need to be sure he takes in 1.5 liters of fluid per day.   He needs protein supplement daily, and needs to take in about 1300 calories per day. Patient taking differently: Take 17 g by mouth daily. Follow package instruction for daily use.  You need to have one soft bowel movement per day.  If he is not doing this call your primary care doctor.  You also need to be sure he takes in 1.5 liters of fluid per day.   He needs protein supplement daily, and needs to take in about 1300 calories per day. 01/15/18  Yes Earnstine Regal, PA-C  scopolamine (TRANSDERM-SCOP) 1 MG/3DAYS Place 1 patch (1.5 mg total) onto the  skin every 3 (three) days. 06/23/20  Yes Nita Sells, MD  sterile water SOLN with amino acids 10 % SOLN 1.3 g/kg, dextrose 70 % SOLN 20 % Inject 1,000 mLs into the vein continuous. TPN 1000 ml w/ lipids  Directions: Infuse TPN 1082m IV through PICC line via CADD SOLIS infusion pump daily over 18 hours with 1 hr ramp up and 1 hr ramp down. Add MVI 10 ml as directed to each bag prior to infusion. MIX WELL VTBI: 1000 ML Bag Volume  1050 ml     Dosing Weight for the patient 72 lbs   Yes [provider]  acetaminophen (TYLENOL) 325 MG tablet Take 2 tablets (650 mg total) by mouth every 6 (six) hours as needed for mild pain, moderate pain, fever or headache (or temp > 100). Patient not taking:  Reported on 06/14/2020 01/15/18   Earnstine Regal, PA-C  doxycycline (VIBRAMYCIN) 100 MG capsule Take 1 capsule (100 mg total) by mouth 2 (two) times daily for 7 days. Patient not taking: Reported on 08/21/2020 08/20/20 08/27/20  Long, Wonda Olds, MD    Allergies:   Allergies  Allergen Reactions  . Fentanyl Other (See Comments)    Dizziness    Social History:  reports that he has never smoked. He has never used smokeless tobacco. He reports that he does not drink alcohol and does not use drugs.  Family History: No family history on file.   Objective   Physical Exam: Blood pressure 104/63, pulse (!) 101, temperature (!) 96.3 F (35.7 C), temperature source Rectal, resp. rate (!) 22, weight 40.8 kg, SpO2 100 %.  Physical Exam Vitals and nursing note reviewed. Exam conducted with a chaperone present.  Constitutional:      Comments: Chronically ill appearing, cachectic  HENT:     Head: Normocephalic.     Mouth/Throat:     Mouth: Mucous membranes are dry.  Eyes:     Extraocular Movements: Extraocular movements intact.  Cardiovascular:     Rate and Rhythm: Regular rhythm. Tachycardia present.     Heart sounds: No murmur heard.   Pulmonary:     Effort: No respiratory distress.      Breath sounds: No wheezing.     Comments: Home bipap mask on Abdominal:     General: Abdomen is flat. There is no distension.     Tenderness: There is no abdominal tenderness.     Comments: Colostomy with nonbloody stool output  Musculoskeletal:        General: No swelling or tenderness.  Skin:    Coloration: Skin is not jaundiced or pale.  Neurological:     Mental Status: He is alert. Mental status is at baseline.  Psychiatric:        Mood and Affect: Mood normal.        Behavior: Behavior normal.     LABS on Admission: I have personally reviewed all the labs and imaging below    Basic Metabolic Panel: Recent Labs  Lab 08/20/20 1616 08/21/20 0814 08/21/20 0922  NA 136 136  --   K 3.2* 2.1*  --   CL 96* 101  --   CO2 26 27  --   GLUCOSE 108* 160*  --   BUN 13 22*  --   CREATININE <0.30* <0.30*  --   CALCIUM 8.9 8.8*  --   MG  --   --  1.8   Liver Function Tests: Recent Labs  Lab 08/20/20 1616 08/21/20 0814  AST 110* 1,897*  ALT 173* 1,573*  ALKPHOS 163* 292*  BILITOT 1.2 3.0*  PROT 8.5* 8.5*  ALBUMIN 4.4 4.2   Recent Labs  Lab 08/20/20 1616 08/21/20 0922  LIPASE 27 21   No results for input(s): AMMONIA in the last 168 hours. CBC: Recent Labs  Lab 08/20/20 1616 08/20/20 1616 08/21/20 0814  WBC 7.2  --  7.6  NEUTROABS 6.3   < > 7.3  HGB 12.6*  --  13.0  HCT 39.6  --  41.0  MCV 82.8   < > 84.7  PLT 105*  --  95*   < > = values in this interval not displayed.   Cardiac Enzymes: No results for input(s): CKTOTAL, CKMB, CKMBINDEX, TROPONINI in the last 168 hours. BNP: Invalid input(s): POCBNP CBG: Recent Labs  Lab 08/21/20 0842 08/21/20 1055  GLUCAP 135* 98    Radiological Exams on Admission:  CT Chest W Contrast  Result Date: 08/21/2020 CLINICAL DATA:  Pneumothorax.  Altered mental status. EXAM: CT CHEST, ABDOMEN, AND PELVIS WITH CONTRAST TECHNIQUE: Multidetector CT imaging of the chest, abdomen and pelvis was performed following the  standard protocol during bolus administration of intravenous contrast. CONTRAST:  176m OMNIPAQUE IOHEXOL 300 MG/ML  SOLN COMPARISON:  Abdomen/pelvis CT 06/15/2020.  CTA chest 05/16/2018. FINDINGS: CT CHEST FINDINGS Cardiovascular: The heart size is normal. No substantial pericardial effusion. No thoracic aortic aneurysm. Left PICC line tip is in the inferior right atrium. Mediastinum/Nodes: Stable appearance thymic remnant anterior mediastinum. No mediastinal lymphadenopathy. There is no hilar lymphadenopathy. The esophagus has normal imaging features. There is no axillary lymphadenopathy. Lungs/Pleura: Small left-sided pneumothorax noted. Small right pleural effusion is associated with mild nodular ground-glass opacity in the inferior right lower lobe. Musculoskeletal: Thoracolumbar scoliosis. Diffuse loss of muscle volume consistent with history of muscular dystrophy. No worrisome lytic or sclerotic osseous abnormality. CT ABDOMEN PELVIS FINDINGS Hepatobiliary: No suspicious focal abnormality within the liver parenchyma. There is no evidence for gallstones, gallbladder wall thickening, or pericholecystic fluid. No intrahepatic or extrahepatic biliary dilation. Pancreas: No focal mass lesion. No dilatation of the main duct. No intraparenchymal cyst. No peripancreatic edema. Spleen: No splenomegaly. No focal mass lesion. Adrenals/Urinary Tract: No adrenal nodule or mass. Tiny nonobstructing stone noted interpolar right kidney. Left kidney unremarkable. No evidence for hydroureter. The urinary bladder appears normal for the degree of distention. Stomach/Bowel: Stomach is unremarkable. No gastric wall thickening. No evidence of outlet obstruction. Diffuse gaseous distention of small bowel and colon noted with left abdominal end colostomy. The colon tracking to the stoma is decompressed but does contain some fluid raising the possibility of diarrhea. Vascular/Lymphatic: No abdominal aortic aneurysm. There is no  gastrohepatic or hepatoduodenal ligament lymphadenopathy. No retroperitoneal or mesenteric lymphadenopathy. No pelvic sidewall lymphadenopathy. Reproductive: The prostate gland and seminal vesicles are unremarkable. Other: No intraperitoneal free fluid. Musculoskeletal: Bones are diffusely demineralized. No worrisome lytic or sclerotic osseous abnormality. IMPRESSION: 1. Small left-sided pneumothorax. 2. Small right pleural effusion associated with mild nodular ground-glass opacity in the inferior right lower lobe. Infectious/inflammatory alveolitis likely. 3. Diffuse gaseous distention of small bowel and colon with left abdominal end colostomy. No abrupt colonic transition zone and the colon tracking to the stoma is decompressed but contains some fluid raising the possibility of diarrhea. 4. Tiny nonobstructing right renal stone. Electronically Signed   By: EMisty StanleyM.D.   On: 08/21/2020 10:36   CT ABDOMEN PELVIS W CONTRAST  Result Date: 08/21/2020 CLINICAL DATA:  Pneumothorax.  Altered mental status. EXAM: CT CHEST, ABDOMEN, AND PELVIS WITH CONTRAST TECHNIQUE: Multidetector CT imaging of the chest, abdomen and pelvis was performed following the standard protocol during bolus administration of intravenous contrast. CONTRAST:  1029mOMNIPAQUE IOHEXOL 300 MG/ML  SOLN COMPARISON:  Abdomen/pelvis CT 06/15/2020.  CTA chest 05/16/2018. FINDINGS: CT CHEST FINDINGS Cardiovascular: The heart size is normal. No substantial pericardial effusion. No thoracic aortic aneurysm. Left PICC line tip is in the inferior right atrium. Mediastinum/Nodes: Stable appearance thymic remnant anterior mediastinum. No mediastinal lymphadenopathy. There is no hilar lymphadenopathy. The esophagus has normal imaging features. There is no axillary lymphadenopathy. Lungs/Pleura: Small left-sided pneumothorax noted. Small right pleural effusion is associated with mild nodular ground-glass opacity in the inferior right lower lobe.  Musculoskeletal: Thoracolumbar scoliosis. Diffuse loss of muscle volume consistent with history of muscular dystrophy. No worrisome lytic or sclerotic osseous abnormality.  CT ABDOMEN PELVIS FINDINGS Hepatobiliary: No suspicious focal abnormality within the liver parenchyma. There is no evidence for gallstones, gallbladder wall thickening, or pericholecystic fluid. No intrahepatic or extrahepatic biliary dilation. Pancreas: No focal mass lesion. No dilatation of the main duct. No intraparenchymal cyst. No peripancreatic edema. Spleen: No splenomegaly. No focal mass lesion. Adrenals/Urinary Tract: No adrenal nodule or mass. Tiny nonobstructing stone noted interpolar right kidney. Left kidney unremarkable. No evidence for hydroureter. The urinary bladder appears normal for the degree of distention. Stomach/Bowel: Stomach is unremarkable. No gastric wall thickening. No evidence of outlet obstruction. Diffuse gaseous distention of small bowel and colon noted with left abdominal end colostomy. The colon tracking to the stoma is decompressed but does contain some fluid raising the possibility of diarrhea. Vascular/Lymphatic: No abdominal aortic aneurysm. There is no gastrohepatic or hepatoduodenal ligament lymphadenopathy. No retroperitoneal or mesenteric lymphadenopathy. No pelvic sidewall lymphadenopathy. Reproductive: The prostate gland and seminal vesicles are unremarkable. Other: No intraperitoneal free fluid. Musculoskeletal: Bones are diffusely demineralized. No worrisome lytic or sclerotic osseous abnormality. IMPRESSION: 1. Small left-sided pneumothorax. 2. Small right pleural effusion associated with mild nodular ground-glass opacity in the inferior right lower lobe. Infectious/inflammatory alveolitis likely. 3. Diffuse gaseous distention of small bowel and colon with left abdominal end colostomy. No abrupt colonic transition zone and the colon tracking to the stoma is decompressed but contains some fluid raising  the possibility of diarrhea. 4. Tiny nonobstructing right renal stone. Electronically Signed   By: Misty Stanley M.D.   On: 08/21/2020 10:36   DG Chest Port 1 View  Result Date: 08/21/2020 CLINICAL DATA:  Hypoglycemia and altered mental status EXAM: PORTABLE CHEST 1 VIEW COMPARISON:  June 19, 2020 and Feb 08, 2020 FINDINGS: Apparent scarring right upper lobe is again noted. Lungs elsewhere are clear. Heart size and pulmonary vascularity are normal. No adenopathy. Central catheter tip is in the right atrium. There is a focal apical pneumothorax without tension component. No bone lesions. IMPRESSION: Left apical pneumothorax without tension component. Central catheter tip in right atrium. Apparent scarring right upper lobe. No consolidation or edema. Heart size normal. Critical Value/emergent results were called by telephone at the time of interpretation on 08/21/2020 at 8:37 am to provider [facility] , who verbally acknowledged these results. Electronically Signed   By: Lowella Grip III M.D.   On: 08/21/2020 08:37   DG Abdomen Acute W/Chest  Result Date: 08/20/2020 CLINICAL DATA:  Fever, abdominal distension, nausea EXAM: DG ABDOMEN ACUTE WITH 1 VIEW CHEST COMPARISON:  08/27/2018 FINDINGS: Supine and upright frontal views of the abdomen as well as an upright frontal view of the chest are obtained. Left-sided PICC tip overlies superior vena cava. The cardiac silhouette is stable. Right suprahilar airspace disease compatible with bronchopneumonia. No effusion or pneumothorax. Diffuse gaseous distention of bowel without obstruction or ileus. No masses or abnormal calcifications. No free gas in the greater peritoneal sac. IMPRESSION: 1. Right suprahilar airspace disease consistent with bronchopneumonia. 2. Diffuse gaseous distension of large and small bowel, without signs of obstruction or ileus. Electronically Signed   By: Randa Ngo M.D.   On: 08/20/2020 15:56      EKG: not done   A  & P   Active Problems:   Muscular dystrophy (HCC)   Hypoglycemia without diagnosis of diabetes mellitus   Sepsis ([place])   Bacteremia   Abnormal LFTs   Acute metabolic encephalopathy   Pneumothorax on left   1. Sepsis without septic shock secondary to MSSA bacteremia  a. Hypothermic, tachycardic, tachypneic, AST 1897, ALT 1573, INR 1.7 without leukocytosis b. SOFA Score > 2: T Bili 2.0-5.9 [2 pts] and Platelets < 100 [2 pts]  c. Received IV fluids in the ED, continue IV fluids d. ID consulted e. Repeat blood cultures f. TTE g. Remove PICC h. Continue Ancef   2. Elevated LFTs a. Alk phos 292, AST 1897, ALT 1573, T bili 3.0, INR 1.7  b. RUQ ultrasound  3. Acute metabolic encephalopathy likely secondary to hypoglycemia, resolved a. Glucose 59 at home, resolved with dextrose b. Continue to monitor POC glucose  4. Hypokalemia a. Replete and follow-up labs  5. Small left apical pneumothorax without tension a. on chronic BiPAP for his chronic hypoxic/hypercapnic respiratory failure from muscular dystrophy b. Discussed with Dr. Elsworth Soho, PCCM, who recommended close observation while on BiPAP with repeat CXR in a.m.  6. Bronchopneumonia a. Currently on Ancef, but will not cover his bronchopneumonia per ID.  Appreciate recommendations  7. Muscular dystrophy, at baseline a. On TPN via PICC line  8. Chronic hypoxic/hypercapnic respiratory failure secondary to muscular dystrophy on chronic BiPAP, at baseline    DVT prophylaxis:      Code Status: Prior  Diet: NPO Family Communication: Admission, patients condition and plan of care including tests being ordered have been discussed with the patient who indicates understanding and agrees with the plan and Code Status. Patient's father was updated  Disposition Plan: The appropriate patient status for this patient is INPATIENT. Inpatient status is judged to be reasonable and necessary in order to provide the required intensity of service to  ensure the patient's safety. The patient's presenting symptoms, physical exam findings, and initial radiographic and laboratory data in the context of their chronic comorbidities is felt to place them at high risk for further clinical deterioration. Furthermore, it is not anticipated that the patient will be medically stable for discharge from the hospital within 2 midnights of admission. The following factors support the patient status of inpatient.   " The patient's presenting symptoms include AMS. " The worrisome physical exam findings include chronically ill. " The initial radiographic and laboratory data are worrisome because of pneumothorax, bacteremia, bronchopneumonia. " The chronic co-morbidities include muscular dystrophy, chronic bipap use.   * I certify that at the point of admission it is my clinical judgment that the patient will require inpatient hospital care spanning beyond 2 midnights from the point of admission due to high intensity of service, high risk for further deterioration and high frequency of surveillance required.*    The medical decision making on this patient was of high complexity and the patient is at high risk for clinical deterioration, therefore this is a level 3  admission.  Consultants  . ID . Discussed with pulmonology over the phone  Procedures  . Remove PICC  Time Spent on Admission: 75 minutes    Harold Hedge, DO [facility]  08/21/2020, 12:20 PM

## 2020-08-22 ENCOUNTER — Inpatient Hospital Stay (HOSPITAL_COMMUNITY): Payer: Managed Care, Other (non HMO)

## 2020-08-22 DIAGNOSIS — R7881 Bacteremia: Secondary | ICD-10-CM

## 2020-08-22 LAB — BLOOD CULTURE ID PANEL (REFLEXED) - BCID2

## 2020-08-22 LAB — BASIC METABOLIC PANEL
Anion gap: 7 (ref 5–15)
BUN: 7 mg/dL (ref 6–20)
CO2: 26 mmol/L (ref 22–32)
Calcium: 8.3 mg/dL — ABNORMAL LOW (ref 8.9–10.3)
Chloride: 99 mmol/L (ref 98–111)
Creatinine, Ser: <0.3 mg/dL — ABNORMAL LOW (ref 0.61–1.24)
Glucose, Bld: 132 mg/dL — ABNORMAL HIGH (ref 70–99)
Potassium: 4.2 mmol/L (ref 3.5–5.1)
Sodium: 132 mmol/L — ABNORMAL LOW (ref 135–145)

## 2020-08-22 LAB — COMPREHENSIVE METABOLIC PANEL
ALT: 802 U/L — ABNORMAL HIGH (ref 0–44)
AST: 621 U/L — ABNORMAL HIGH (ref 15–41)
Albumin: 3.1 g/dL — ABNORMAL LOW (ref 3.5–5.0)
Alkaline Phosphatase: 224 U/L — ABNORMAL HIGH (ref 38–126)
Anion gap: 9 (ref 5–15)
BUN: 12 mg/dL (ref 6–20)
CO2: 25 mmol/L (ref 22–32)
Calcium: 8.3 mg/dL — ABNORMAL LOW (ref 8.9–10.3)
Chloride: 101 mmol/L (ref 98–111)
Creatinine, Ser: <0.3 mg/dL — ABNORMAL LOW (ref 0.61–1.24)
Glucose, Bld: 109 mg/dL — ABNORMAL HIGH (ref 70–99)
Potassium: 2.2 mmol/L — CL (ref 3.5–5.1)
Sodium: 135 mmol/L (ref 135–145)
Total Bilirubin: 1.3 mg/dL — ABNORMAL HIGH (ref 0.3–1.2)
Total Protein: 6 g/dL — ABNORMAL LOW (ref 6.5–8.1)

## 2020-08-22 LAB — CBC
HCT: 33.6 % — ABNORMAL LOW (ref 39.0–52.0)
Hemoglobin: 10.8 g/dL — ABNORMAL LOW (ref 13.0–17.0)
MCH: 26.5 pg (ref 26.0–34.0)
MCHC: 32.1 g/dL (ref 30.0–36.0)
MCV: 82.6 fL (ref 80.0–100.0)
Platelets: 72 10*3/uL — ABNORMAL LOW (ref 150–400)
RBC: 4.07 MIL/uL — ABNORMAL LOW (ref 4.22–5.81)
RDW: 14.8 % (ref 11.5–15.5)
WBC: 2.8 10*3/uL — ABNORMAL LOW (ref 4.0–10.5)
nRBC: 0 % (ref 0.0–0.2)

## 2020-08-22 LAB — PROCALCITONIN: Procalcitonin: 33.84 ng/mL

## 2020-08-22 LAB — GLUCOSE, CAPILLARY
Glucose-Capillary: 113 mg/dL — ABNORMAL HIGH (ref 70–99)
Glucose-Capillary: 107 mg/dL — ABNORMAL HIGH (ref 70–99)
Glucose-Capillary: 99 mg/dL (ref 70–99)
Glucose-Capillary: 100 mg/dL — ABNORMAL HIGH (ref 70–99)
Glucose-Capillary: 116 mg/dL — ABNORMAL HIGH (ref 70–99)
Glucose-Capillary: 141 mg/dL — ABNORMAL HIGH (ref 70–99)
Glucose-Capillary: 120 mg/dL — ABNORMAL HIGH (ref 70–99)
Glucose-Capillary: 109 mg/dL — ABNORMAL HIGH (ref 70–99)
Glucose-Capillary: 92 mg/dL (ref 70–99)

## 2020-08-22 LAB — PROTIME-INR
INR: 2.2 — ABNORMAL HIGH (ref 0.8–1.2)
Prothrombin Time: 23.4 seconds — ABNORMAL HIGH (ref 11.4–15.2)

## 2020-08-22 MED ORDER — POTASSIUM CHLORIDE 20 MEQ PO PACK
40.0000 meq | PACK | ORAL | Status: AC
  Administered 2020-08-22 (×2): 40 meq via ORAL
  Filled 2020-08-22 (×2): qty 2

## 2020-08-22 MED ORDER — BOOST / RESOURCE BREEZE PO LIQD CUSTOM
1.0000 | Freq: Three times a day (TID) | ORAL | Status: DC
  Administered 2020-08-22 – 2020-08-26 (×10): 1 via ORAL

## 2020-08-22 MED ORDER — DEXTROSE 10 % IV SOLN: INTRAVENOUS | Status: DC

## 2020-08-22 MED ORDER — CEFAZOLIN SODIUM-DEXTROSE 2-4 GM/100ML-% IV SOLN
2.0000 g | Freq: Three times a day (TID) | INTRAVENOUS | Status: DC
  Administered 2020-08-22 – 2020-08-27 (×16): 2 g via INTRAVENOUS
  Filled 2020-08-22 (×18): qty 100

## 2020-08-22 MED ORDER — POTASSIUM CHLORIDE 2 MEQ/ML IV SOLN
INTRAVENOUS | Status: AC
  Filled 2020-08-22 (×5): qty 1000

## 2020-08-22 MED ORDER — MAGIC MOUTHWASH
5.0000 mL | Freq: Three times a day (TID) | ORAL | Status: DC | PRN
  Administered 2020-08-22 – 2020-08-24 (×2): 5 mL via ORAL
  Filled 2020-08-22 (×4): qty 5

## 2020-08-22 NOTE — Progress Notes (Signed)
[facility]   Reason for visit: Follow up on bacteremia  Interval History: 1 bottle repeat blood culture sent yesterday. WBC down to 2.8.  No fever since yesterday.  picc line removed.   Day 2 cefazolin  Physical Exam: Constitutional:  Vitals:   08/22/20 0651 08/22/20 1345  BP: (!) 98/52 (!) 92/43  Pulse: 88   Resp: (!) 21 (!) 25  Temp: 97.8 F (36.6 C) 98.8 F (37.1 C)  SpO2: 100%    patient appears in NAD Eyes: anicteric HENT: + oxygen facemask Cardiovascular: RRR GI: soft, nt, nd  Review of Systems: Constitutional: negative for fevers and chills Gastrointestinal: negative for diarrhea  Lab Results  Component Value Date   WBC 2.8 (L) 08/22/2020   HGB 10.8 (L) 08/22/2020   HCT 33.6 (L) 08/22/2020   MCV 82.6 08/22/2020   PLT 72 (L) 08/22/2020    Lab Results  Component Value Date   CREATININE <0.30 (L) 08/22/2020   BUN 12 08/22/2020   NA 135 08/22/2020   K 2.2 (LL) 08/22/2020   CL 101 08/22/2020   CO2 25 08/22/2020    Lab Results  Component Value Date   ALT 802 (H) 08/22/2020   AST 621 (H) 08/22/2020   ALKPHOS 224 (H) 08/22/2020     Microbiology: Recent Results (from the past 240 hour(s))  Culture, blood (routine x 2)     Status: Abnormal (Preliminary result)   Collection Time: 08/20/20  4:10 PM   Specimen: BLOOD LEFT ARM  Result Value Ref Range Status   Specimen Description   Final    BLOOD LEFT ARM UPPER PICC Performed at [facility], [address] [place], [place] [address]    Special Requests   Final    BOTTLES DRAWN AEROBIC AND ANAEROBIC Blood Culture adequate volume Performed at [facility], [address] [place], [place] [address]    Culture  Setup Time   Final    GRAM POSITIVE COCCI IN BOTH AEROBIC AND ANAEROBIC BOTTLES Organism ID to follow CRITICAL RESULT CALLED TO, READ BACK BY AND VERIFIED WITH: PHRMD M LILLISON $RemoveBe'[id number]$  08/21/20 BY S GEZAHEGN Performed at [facility], [address], [place], [place] [address]    Culture STAPHYLOCOCCUS AUREUS (A)  Final   Report Status PENDING  Incomplete  Blood Culture ID Panel (Reflexed)     Status: Abnormal   Collection Time: 08/20/20  4:10 PM  Result Value Ref Range Status   Enterococcus faecalis NOT DETECTED NOT DETECTED Final   Enterococcus Faecium NOT DETECTED NOT DETECTED Final   Listeria monocytogenes NOT DETECTED NOT DETECTED Final   Staphylococcus species DETECTED (A) NOT DETECTED Final    Comment: CRITICAL RESULT CALLED TO, READ BACK BY AND VERIFIED WITH: PHRMD M LILLISON $RemoveBe'[email or web address]'[email or web address]$  08/21/20 BY S GEZAHEGN    Staphylococcus aureus (BCID) DETECTED (A) NOT DETECTED Final    Comment: CRITICAL RESULT CALLED TO, READ BACK BY AND VERIFIED WITH: PHRMD M LILLISON $RemoveBe'[email or web address]$  08/21/20 BY S GEZAHEGN    Staphylococcus epidermidis NOT DETECTED NOT DETECTED Final   Staphylococcus lugdunensis NOT DETECTED NOT DETECTED Final   Streptococcus species NOT DETECTED NOT DETECTED Final   Streptococcus agalactiae NOT DETECTED NOT DETECTED Final   Streptococcus pneumoniae NOT DETECTED NOT DETECTED Final   Streptococcus pyogenes NOT DETECTED NOT DETECTED Final   A.calcoaceticus-baumannii NOT DETECTED NOT DETECTED Final   Bacteroides fragilis NOT DETECTED NOT DETECTED Final   Enterobacterales NOT DETECTED NOT  DETECTED Final   Enterobacter cloacae complex NOT DETECTED NOT DETECTED Final   Escherichia coli NOT DETECTED NOT DETECTED Final   Klebsiella aerogenes NOT DETECTED NOT DETECTED Final   Klebsiella oxytoca NOT DETECTED NOT DETECTED Final   Klebsiella pneumoniae NOT DETECTED NOT DETECTED Final   Proteus species NOT DETECTED NOT DETECTED Final   Salmonella species NOT DETECTED NOT DETECTED Final   Serratia marcescens NOT DETECTED NOT DETECTED Final   Haemophilus influenzae NOT DETECTED NOT DETECTED Final   Neisseria meningitidis NOT DETECTED NOT DETECTED Final   Pseudomonas aeruginosa NOT DETECTED NOT DETECTED Final    Stenotrophomonas maltophilia NOT DETECTED NOT DETECTED Final   Candida albicans NOT DETECTED NOT DETECTED Final   Candida auris NOT DETECTED NOT DETECTED Final   Candida glabrata NOT DETECTED NOT DETECTED Final   Candida krusei NOT DETECTED NOT DETECTED Final   Candida parapsilosis NOT DETECTED NOT DETECTED Final   Candida tropicalis NOT DETECTED NOT DETECTED Final   Cryptococcus neoformans/gattii NOT DETECTED NOT DETECTED Final   Meth resistant mecA/C and MREJ NOT DETECTED NOT DETECTED Final    Comment: Performed at [facility], [place] [address], [place], [place] [address]  Respiratory Panel by RT PCR (Flu A&B, Covid) - Nasopharyngeal Swab     Status: None   Collection Time: 08/20/20  4:16 PM   Specimen: Nasopharyngeal Swab  Result Value Ref Range Status   SARS Coronavirus 2 by RT PCR NEGATIVE NEGATIVE Final    Comment: (NOTE) SARS-CoV-2 target nucleic acids are NOT DETECTED.  The SARS-CoV-2 RNA is generally detectable in upper respiratoy specimens during the acute phase of infection. The lowest concentration of SARS-CoV-2 viral copies this assay can detect is 131 copies/mL. A negative result does not preclude SARS-Cov-2 infection and should not be used as the sole basis for treatment or other patient management decisions. A negative result may occur with  improper specimen collection/handling, submission of specimen other than nasopharyngeal swab, presence of viral mutation(s) within the areas targeted by this assay, and inadequate number of viral copies (<131 copies/mL). A negative result must be combined with clinical observations, patient history, and epidemiological information. The expected result is Negative.  Fact Sheet for Patients:  PinkCheek.be  Fact Sheet for Healthcare Providers:  GravelBags.it  This test is no t yet approved or cleared by the [place] FDA and  has been authorized for detection  and/or diagnosis of SARS-CoV-2 by FDA under an Emergency Use Authorization (EUA). This EUA will remain  in effect (meaning this test can be used) for the duration of the COVID-19 declaration under Section 564(b)(1) of the Act, 21 U.S.C. section 360bbb-3(b)(1), unless the authorization is terminated or revoked sooner.     Influenza A by PCR NEGATIVE NEGATIVE Final   Influenza B by PCR NEGATIVE NEGATIVE Final    Comment: (NOTE) The Xpert Xpress SARS-CoV-2/FLU/RSV assay is intended as an aid in  the diagnosis of influenza from Nasopharyngeal swab specimens and  should not be used as a sole basis for treatment. Nasal washings and  aspirates are unacceptable for Xpert Xpress SARS-CoV-2/FLU/RSV  testing.  Fact Sheet for Patients: PinkCheek.be  Fact Sheet for Healthcare Providers: GravelBags.it  This test is not yet approved or cleared by the [place] FDA and  has been authorized for detection and/or diagnosis of SARS-CoV-2 by  FDA under an Emergency Use Authorization (EUA). This EUA will remain  in effect (meaning this test can be used) for the duration of the  Covid-19 declaration  under Section 564(b)(1) of the Act, 21  U.S.C. section 360bbb-3(b)(1), unless the authorization is  terminated or revoked. Performed at [facility], [address] [place], [place] [address]   Urine culture     Status: Abnormal (Preliminary result)   Collection Time: 08/20/20  7:36 PM   Specimen: Urine, Random  Result Value Ref Range Status   Specimen Description   Final    URINE, RANDOM Performed at [facility], [address] [place], [place] [address]    Special Requests   Final    NONE Performed at [facility], [address] [place], [place] [address]    Culture (A)  Final    60,000 COLONIES/mL ESCHERICHIA COLI 30,000 COLONIES/mL ENTEROCOCCUS FAECALIS SUSCEPTIBILITIES TO FOLLOW Performed at  [facility], [place] [address], [place], [place] [address]    Report Status PENDING  Incomplete  Urine culture     Status: Abnormal (Preliminary result)   Collection Time: 08/21/20  8:14 AM   Specimen: In/Out Cath Urine  Result Value Ref Range Status   Specimen Description   Final    IN/OUT CATH URINE Performed at [facility] [address] [place], [place] [address]    Special Requests   Final    URINE, RANDOM Performed at [facility] [address] [place], [place] [address]    Culture (A)  Final    20,000 COLONIES/mL ESCHERICHIA COLI 20,000 COLONIES/mL ENTEROCOCCUS FAECALIS SUSCEPTIBILITIES TO FOLLOW Performed at [facility], [place] [address], [place], [place] [address]    Report Status PENDING  Incomplete  Blood culture (routine x 2)     Status: None (Preliminary result)   Collection Time: 08/21/20  8:14 AM   Specimen: BLOOD RIGHT ARM  Result Value Ref Range Status   Specimen Description   Final    BLOOD RIGHT ARM Performed at [facility], [place] [address], [place], [place] [address]    Special Requests   Final    BOTTLES DRAWN AEROBIC ONLY Blood Culture results may not be optimal due to an inadequate volume of blood received in culture bottles Performed at [facility] [address], [place], [place] [address]    Culture   Final    NO GROWTH < 24 HOURS Performed at [facility] [address], [place],  [address]    Report Status PENDING  Incomplete  Respiratory Panel by RT PCR (Flu A&B, Covid) - Nasopharyngeal Swab     Status: None   Collection Time: 08/21/20  8:27 AM   Specimen: Nasopharyngeal Swab  Result Value Ref Range Status   SARS Coronavirus 2 by RT PCR NEGATIVE NEGATIVE Final    Comment: (NOTE) SARS-CoV-2 target nucleic acids are NOT DETECTED.  The SARS-CoV-2 RNA is generally detectable in upper respiratoy specimens during the acute phase of infection. The  lowest concentration of SARS-CoV-2 viral copies this assay can detect is 131 copies/mL. A negative result does not preclude SARS-Cov-2 infection and should not be used as the sole basis for treatment or other patient management decisions. A negative result may occur with  improper specimen collection/handling, submission of specimen other than nasopharyngeal swab, presence of viral mutation(s) within the areas targeted by this assay, and inadequate number of viral copies (<131 copies/mL). A negative result must be combined with clinical observations, patient history, and epidemiological information. The expected result is Negative.  Fact Sheet  for Patients:  PinkCheek.be  Fact Sheet for Healthcare Providers:  GravelBags.it  This test is no t yet approved or cleared by the [place] FDA and  has been authorized for detection and/or diagnosis of SARS-CoV-2 by FDA under an Emergency Use Authorization (EUA). This EUA will remain  in effect (meaning this test can be used) for the duration of the COVID-19 declaration under Section 564(b)(1) of the Act, 21 U.S.C. section 360bbb-3(b)(1), unless the authorization is terminated or revoked sooner.     Influenza A by PCR NEGATIVE NEGATIVE Final   Influenza B by PCR NEGATIVE NEGATIVE Final    Comment: (NOTE) The Xpert Xpress SARS-CoV-2/FLU/RSV assay is intended as an aid in  the diagnosis of influenza from Nasopharyngeal swab specimens and  should not be used as a sole basis for treatment. Nasal washings and  aspirates are unacceptable for Xpert Xpress SARS-CoV-2/FLU/RSV  testing.  Fact Sheet for Patients: PinkCheek.be  Fact Sheet for Healthcare Providers: GravelBags.it  This test is not yet approved or cleared by the [place] FDA and  has been authorized for detection and/or diagnosis of SARS-CoV-2 by  FDA under  an Emergency Use Authorization (EUA). This EUA will remain  in effect (meaning this test can be used) for the duration of the  Covid-19 declaration under Section 564(b)(1) of the Act, 21  U.S.C. section 360bbb-3(b)(1), unless the authorization is  terminated or revoked. Performed at [facility], [place] [address], [place], [place] [address]     Impression/Plan:  1. MSSA bacteremia due to line infection - line removed.  Repeat blood culture sent.  TTE without obvious vegetation.  With his underlying muscular dystrophy, I do not feel he is a good candidate for TEE.  Additionally, this is most c/w a line infection.  I will opt to treat him for 4 weeks total from negative blood culture.    2.  Access - will need a picc replacement when blood cultures confirm clearance.   3.  ? Pneumonia - appears to be about at his baseline.  On antibiotics as above.  No additional treatment indicated.

## 2020-08-22 NOTE — Progress Notes (Signed)
   08/21/20 2038  Assess: MEWS Score  Temp 98.3 F (36.8 C)  BP (!) 84/43  Pulse Rate (!) 107  ECG Heart Rate (!) 109  Resp (!) 22  Level of Consciousness Alert  SpO2 99 %  Assess: MEWS Score  MEWS Temp 0  MEWS Systolic 1  MEWS Pulse 1  MEWS RR 1  MEWS LOC 0  MEWS Score 3  MEWS Score Color Yellow  Assess: if the MEWS score is Yellow or Red  Were vital signs taken at a resting state? Yes  Focused Assessment Change from prior assessment (see assessment flowsheet)  Early Detection of Sepsis Score *See Row Information* Medium  MEWS guidelines implemented *See Row Information* Yes  Treat  Pain Scale 0-10  Pain Score 0  Take Vital Signs  Increase Vital Sign Frequency  Yellow: Q 2hr X 2 then Q 4hr X 2, if remains yellow, continue Q 4hrs  Escalate  MEWS: Escalate Yellow: discuss with charge nurse/RN and consider discussing with provider and RRT  Notify: Charge Nurse/RN  Name of Charge Nurse/RN Notified Sharyn Lull   Date Charge Nurse/RN Notified 08/21/20  Time Charge Nurse/RN Notified 2120  Document  Patient Outcome Stabilized after interventions  patient just had lopressor at 1730. Patient is resting comfortably with father at bedside.  Patient is being monitor with a continous tele monitor at the bedside. Will also monitor patient CBG level.

## 2020-08-22 NOTE — Progress Notes (Signed)
PROGRESS NOTE  Peter Hayes  RKY:[id number] DOB: Dec 05, 1995 DOA: 08/21/2020 PCP: Penni Bombard, PA   Brief Narrative: Peter Hayes is a 24 y.o. 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 He had been in the ED 11/13 for a fever of 102F at home and abdominal discomfort with mild distension with nausea and tachycardia in the ED but missed his home metoprolol. His vitals improved with IV fluids and antipyretic. CXR yesterday showed possible bronchopneumonia and blood cultures were taken from his PICC line. He was discharged with doxycycline with strict outpatient follow up. Today, his father, who is his primary caretaker, noticed that he appeared confused and not responding normally and checked his blood glucose which was 59 and called EMS. He was given dextrose by EMS with improvement. On further chart review per ED provider today, the patient was found to have Staph aureus positive blood cultures from yesterday via PICC line and was started on Ancef 2g IV per pharmacy recommendations.   ED Course: Hypothermic (T 96.71F), tachycardic, tachypnic, normotensive on home BiPAP. Notable Labs: Na 136, K 2.1, Glucose 160, BUN 22, Cr 0.3, Mg 1.8, Alk phos 292, AST 1897, ALT 1573, T bili 3.0, Lactic acid 1.3, WBC 7.6, Hb 13.0, Platelets 95, INR 1.7. Notable Imaging: CXR with Left apical pneumothorax without consolidation or edema. CT chest/abd/pelv W contrast: small left sided pneumothorax, small right pleural effusion with mild nodular ground glass opacity in the inferior right lower lobe (infectious vs. Inflammatory alveolitis), diffuse gaseous distension of small bowel and colon with colostomy without colonic transition zone. Patient received 1.5 L LR bolus, Ancef and potassium IV. ID was consulted for Staph  bacteremia.  Assessment & Plan: Active Problems:   Muscular dystrophy ([place])   Hypoglycemia without diagnosis of diabetes mellitus   Sepsis ([place])   Bacteremia   Abnormal LFTs   Acute metabolic encephalopathy   Pneumothorax on left  Sepsis due to MSSA bacteremia: +Cultures from 11/13 from PICC.  - Discontinued PICC line. Hopeful to avoid placing line before cultures cleared. Will need PICC replaced vs. tunneled line (will be difficult to replace) after line holiday. - Appreciate ID recommendations. Continue ancef pending susceptibilities.  - Blood cultures repeated from right arm 11/14 prior to removal of PICC - TTE showed presence of PICC into right atrium also seen on CT. No vegetations noted, normal function overall (limited by habitus).   Bronchopneumonia: mild nodular GGO in inferior RLL.   - Cover for aspiration due to his high risk with unasyn or add flagyl, will defer to ID. - Sputum culture requested if able to expectorate  Left PTX: Also has history of pneumomediastinum complicating chronic NIPPV use.  - Repeat CXR this AM is stable. Will continue serial CXR.   Acute metabolic encephalopathy: Most consistent with hypoglycemia-induced AMS which has resolved.   Hypoglycemia: Related both to malnutrition and diminished stores for glycogenolysis and gluconeogenesis.  - Continue dextrose infusion, checking CBGs q2h with additional dextrose to be given prn.   Hypokalemia: Severe.  - Repeat supplementation this morning, recheck this PM and continue supplementation as able. Will add to D10.  Chronic hypoxic and hypercarbic respiratory failure:  - Trilogy-dependent.   Right pleural effusion: Has history of this in the past. Previous thoracentesis not suggestive of infection at that time.  - Repeat CXR shows stability.  LFT elevations: Unclear precipitant. Improving.  - RUS U/S pending. CT abd showed no significant hepatic or biliary lesions/obstructions.    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 Has been on TPN since that time. CT abd stable.  Severe protein-calorie malnutrition: Pt's family has opted for chronic PICC/TPN in lieu of gastrostomy. - Continue TPN once line back in. Can get SLP evaluation and feed pureed foods until that time. Check prealbumin in AM.  Duchenne's muscular dystrophy: Noted - Needs to continue outpatient neurology follow up. - Continue scopolamine patch.   Sinus tachycardia: Chronic.  - Continue metoprolol  Hypotension: Appears chronic, has been on midodrine 70m in the past per PCP notes. Lactic acid not elevated.  - Continue monitoring closely in progressive care unit.   Pancytopenia:  - Recheck CBC in AM.  Anemia, normocytic: Suspect to be due to malnutrition, chronic disease, possibly iron deficiency.  - Check anemia panel.  - Note pt is Jehovah's Witness.  DVT prophylaxis: Lovenox Code Status: Full Family Communication: Father at bedside Disposition Plan:  Status is: Inpatient  Remains inpatient appropriate because:Hemodynamically unstable, Persistent severe electrolyte disturbances, Altered mental status, Ongoing diagnostic testing needed not appropriate for outpatient work up and IV treatments appropriate due to intensity of illness or inability to take PO  Dispo: The patient is from: Home              Anticipated d/c is to: Home              Anticipated d/c date is: > 3 days              Patient currently is not medically stable to d/c.  Consultants:   ID  PCCM phone consultation  Procedures:   PICC removal 08/21/2020  Antimicrobials:  Ancef  Unasyn   Subjective: Feels no trouble breathing from his recent baseline, no fever, no chest pain or other pain. Father reports he's at his mental baseline again. Wants to eat, family has brought food from home.   Objective: Vitals:   08/22/20 0500 08/22/20 0600 08/22/20 0651 08/22/20 1345  BP: (!)  94/41 (!) 95/52 (!) 98/52 (!) 92/43  Pulse: 87 88 88   Resp: (!) 22 (!) 31 (!) 21 (!) 25  Temp:   97.8 F (36.6 C) 98.8 F (37.1 C)  TempSrc:   Axillary Axillary  SpO2: 98% 100% 100%   Weight: 40.8 kg       Intake/Output Summary (Last 24 hours) at 08/22/2020 1420 Last data filed at 08/22/2020 1000 Gross per 24 hour  Intake 733.19 ml  Output 225 ml  Net 508.19 ml   Filed Weights   08/21/20 0631 08/22/20 0500  Weight: 40.8 kg 40.8 kg    Gen: Cachectic male in no distress with NIPPV on Pulm: Non-labored NIPPV-assisted breaths. Diminished at right base > left base.  CV: Regular borderline tachycardia. No murmur, rub, or gallop. No JVD, no pedal edema. GI: Abdomen soft, scaphoid, surgical scars noted, non-tender, non-distended, with normoactive bowel sounds. No organomegaly or masses felt. Ext: Warm, contracted extremities with decreased muscle bulk and tone. Skin: No rashes, lesions or ulcers on visualized skin Neuro: Alert and oriented. No NEW neurological deficits per father Psych: Judgement and insight appear normal. Mood & affect appropriate.   Data Reviewed: I have personally reviewed following labs and imaging studies  CBC: Recent Labs  Lab 08/20/20 1616 08/21/20 0814 08/22/20 0413  WBC 7.2 7.6 2.8*  NEUTROABS 6.3  7.3  --   HGB 12.6* 13.0 10.8*  HCT 39.6 41.0 33.6*  MCV 82.8 84.7 82.6  PLT 105* 95* 72*   Basic Metabolic Panel: Recent Labs  Lab 08/20/20 1616 08/21/20 0814 08/21/20 0922 08/21/20 1305 08/22/20 0413  NA 136 136  --  134* 135  K 3.2* 2.1*  --  2.8* 2.2*  CL 96* 101  --  102 101  CO2 26 27  --  25 25  GLUCOSE 108* 160*  --  69* 109*  BUN 13 22*  --  13 12  CREATININE <0.30* <0.30*  --  <0.30* <0.30*  CALCIUM 8.9 8.8*  --  8.4* 8.3*  MG  --   --  1.8  --   --    GFR: CrCl cannot be calculated (This lab value cannot be used to calculate CrCl because it is not a number: <0.30). Liver Function Tests: Recent Labs  Lab 08/20/20 1616  08/21/20 0814 08/22/20 0413  AST 110* 1,897* 621*  ALT 173* 1,573* 802*  ALKPHOS 163* 292* 224*  BILITOT 1.2 3.0* 1.3*  PROT 8.5* 8.5* 6.0*  ALBUMIN 4.4 4.2 3.1*   Recent Labs  Lab 08/20/20 1616 08/21/20 0922  LIPASE 27 21   No results for input(s): AMMONIA in the last 168 hours. Coagulation Profile: Recent Labs  Lab 08/21/20 0814 08/22/20 0413  INR 1.7* 2.2*   Cardiac Enzymes: No results for input(s): CKTOTAL, CKMB, CKMBINDEX, TROPONINI in the last 168 hours. BNP (last 3 results) No results for input(s): PROBNP in the last 8760 hours. HbA1C: No results for input(s): HGBA1C in the last 72 hours. CBG: Recent Labs  Lab 08/22/20 0255 08/22/20 0647 08/22/20 0842 08/22/20 0946 08/22/20 1129  GLUCAP 113* 107* 99 100* 116*   Lipid Profile: No results for input(s): CHOL, HDL, LDLCALC, TRIG, CHOLHDL, LDLDIRECT in the last 72 hours. Thyroid Function Tests: No results for input(s): TSH, T4TOTAL, FREET4, T3FREE, THYROIDAB in the last 72 hours. Anemia Panel: No results for input(s): VITAMINB12, FOLATE, FERRITIN, TIBC, IRON, RETICCTPCT in the last 72 hours. Urine analysis:    Component Value Date/Time   COLORURINE AMBER (A) 08/21/2020 0814   APPEARANCEUR HAZY (A) 08/21/2020 0814   LABSPEC 1.015 08/21/2020 0814   PHURINE 5.0 08/21/2020 0814   GLUCOSEU NEGATIVE 08/21/2020 0814   HGBUR NEGATIVE 08/21/2020 0814   BILIRUBINUR NEGATIVE 08/21/2020 0814   KETONESUR 20 (A) 08/21/2020 0814   PROTEINUR 100 (A) 08/21/2020 0814   NITRITE NEGATIVE 08/21/2020 0814   LEUKOCYTESUR NEGATIVE 08/21/2020 0814   Recent Results (from the past 240 hour(s))  Culture, blood (routine x 2)     Status: Abnormal (Preliminary result)   Collection Time: 08/20/20  4:10 PM   Specimen: BLOOD LEFT ARM  Result Value Ref Range Status   Specimen Description   Final    BLOOD LEFT ARM UPPER PICC Performed at [facility], [address] [place], [place] [address]    Special Requests    Final    BOTTLES DRAWN AEROBIC AND ANAEROBIC Blood Culture adequate volume Performed at [facility], [address] [place], [place] [address]    Culture  Setup Time   Final    GRAM POSITIVE COCCI IN BOTH AEROBIC AND ANAEROBIC BOTTLES Organism ID to follow CRITICAL RESULT CALLED TO, READ BACK BY AND VERIFIED WITH: PHRMD M LILLISON '@[id number]'  08/21/20 BY S GEZAHEGN Performed at [facility], [place] [address], [place], [place] [address]    Culture  STAPHYLOCOCCUS AUREUS (A)  Final   Report Status PENDING  Incomplete  Blood Culture ID Panel (Reflexed)     Status: Abnormal   Collection Time: 08/20/20  4:10 PM  Result Value Ref Range Status   Enterococcus faecalis NOT DETECTED NOT DETECTED Final   Enterococcus Faecium NOT DETECTED NOT DETECTED Final   Listeria monocytogenes NOT DETECTED NOT DETECTED Final   Staphylococcus species DETECTED (A) NOT DETECTED Final    Comment: CRITICAL RESULT CALLED TO, READ BACK BY AND VERIFIED WITH: PHRMD M LILLISON '@[id number]'  08/21/20 BY S GEZAHEGN    Staphylococcus aureus (BCID) DETECTED (A) NOT DETECTED Final    Comment: CRITICAL RESULT CALLED TO, READ BACK BY AND VERIFIED WITH: PHRMD M LILLISON '@[id number]'  08/21/20 BY S GEZAHEGN    Staphylococcus epidermidis NOT DETECTED NOT DETECTED Final   Staphylococcus lugdunensis NOT DETECTED NOT DETECTED Final   Streptococcus species NOT DETECTED NOT DETECTED Final   Streptococcus agalactiae NOT DETECTED NOT DETECTED Final   Streptococcus pneumoniae NOT DETECTED NOT DETECTED Final   Streptococcus pyogenes NOT DETECTED NOT DETECTED Final   A.calcoaceticus-baumannii NOT DETECTED NOT DETECTED Final   Bacteroides fragilis NOT DETECTED NOT DETECTED Final   Enterobacterales NOT DETECTED NOT DETECTED Final   Enterobacter cloacae complex NOT DETECTED NOT DETECTED Final   Escherichia coli NOT DETECTED NOT DETECTED Final   Klebsiella aerogenes NOT DETECTED NOT DETECTED Final   Klebsiella oxytoca NOT DETECTED NOT  DETECTED Final   Klebsiella pneumoniae NOT DETECTED NOT DETECTED Final   Proteus species NOT DETECTED NOT DETECTED Final   Salmonella species NOT DETECTED NOT DETECTED Final   Serratia marcescens NOT DETECTED NOT DETECTED Final   Haemophilus influenzae NOT DETECTED NOT DETECTED Final   Neisseria meningitidis NOT DETECTED NOT DETECTED Final   Pseudomonas aeruginosa NOT DETECTED NOT DETECTED Final   Stenotrophomonas maltophilia NOT DETECTED NOT DETECTED Final   Candida albicans NOT DETECTED NOT DETECTED Final   Candida auris NOT DETECTED NOT DETECTED Final   Candida glabrata NOT DETECTED NOT DETECTED Final   Candida krusei NOT DETECTED NOT DETECTED Final   Candida parapsilosis NOT DETECTED NOT DETECTED Final   Candida tropicalis NOT DETECTED NOT DETECTED Final   Cryptococcus neoformans/gattii NOT DETECTED NOT DETECTED Final   Meth resistant mecA/C and MREJ NOT DETECTED NOT DETECTED Final    Comment: Performed at [facility], [address], [place], [place] [address]  Respiratory Panel by RT PCR (Flu A&B, Covid) - Nasopharyngeal Swab     Status: None   Collection Time: 08/20/20  4:16 PM   Specimen: Nasopharyngeal Swab  Result Value Ref Range Status   SARS Coronavirus 2 by RT PCR NEGATIVE NEGATIVE Final    Comment: (NOTE) SARS-CoV-2 target nucleic acids are NOT DETECTED.  The SARS-CoV-2 RNA is generally detectable in upper respiratoy specimens during the acute phase of infection. The lowest concentration of SARS-CoV-2 viral copies this assay can detect is 131 copies/mL. A negative result does not preclude SARS-Cov-2 infection and should not be used as the sole basis for treatment or other patient management decisions. A negative result may occur with  improper specimen collection/handling, submission of specimen other than nasopharyngeal swab, presence of viral mutation(s) within the areas targeted by this assay, and inadequate number of viral copies (<131 copies/mL). A  negative result must be combined with clinical observations, patient history, and epidemiological information. The expected result is Negative.  Fact Sheet for Patients:  PinkCheek.be  Fact Sheet for Healthcare Providers:  GravelBags.it  This test  is no t yet approved or cleared by the [place] and  has been authorized for detection and/or diagnosis of SARS-CoV-2 by FDA under an Emergency Use Authorization (EUA). This EUA will remain  in effect (meaning this test can be used) for the duration of the COVID-19 declaration under Section 564(b)(1) of the Act, 21 U.S.C. section 360bbb-3(b)(1), unless the authorization is terminated or revoked sooner.     Influenza A by PCR NEGATIVE NEGATIVE Final   Influenza B by PCR NEGATIVE NEGATIVE Final    Comment: (NOTE) The Xpert Xpress SARS-CoV-2/FLU/RSV assay is intended as an aid in  the diagnosis of influenza from Nasopharyngeal swab specimens and  should not be used as a sole basis for treatment. Nasal washings and  aspirates are unacceptable for Xpert Xpress SARS-CoV-2/FLU/RSV  testing.  Fact Sheet for Patients: PinkCheek.be  Fact Sheet for Healthcare Providers: GravelBags.it  This test is not yet approved or cleared by the [place] FDA and  has been authorized for detection and/or diagnosis of SARS-CoV-2 by  FDA under an Emergency Use Authorization (EUA). This EUA will remain  in effect (meaning this test can be used) for the duration of the  Covid-19 declaration under Section 564(b)(1) of the Act, 21  U.S.C. section 360bbb-3(b)(1), unless the authorization is  terminated or revoked. Performed at [facility], [address] [place], [place] [address]   Urine culture     Status: Abnormal (Preliminary result)   Collection Time: 08/20/20  7:36 PM   Specimen: Urine, Random  Result Value Ref  Range Status   Specimen Description   Final    URINE, RANDOM Performed at [facility], [address] [place], [place] [address]    Special Requests   Final    NONE Performed at [facility], [address] [place], [place] [address]    Culture (A)  Final    60,000 COLONIES/mL ESCHERICHIA COLI 30,000 COLONIES/mL ENTEROCOCCUS FAECALIS SUSCEPTIBILITIES TO FOLLOW Performed at [facility], [place] [address], [place], [place] [address]    Report Status PENDING  Incomplete  Urine culture     Status: Abnormal (Preliminary result)   Collection Time: 08/21/20  8:14 AM   Specimen: In/Out Cath Urine  Result Value Ref Range Status   Specimen Description   Final    IN/OUT CATH URINE Performed at [place] [address], [place], [place] [address]    Special Requests   Final    URINE, RANDOM Performed at [place] [facility] [address] [place], [place] [address]    Culture (A)  Final    20,000 COLONIES/mL ESCHERICHIA COLI 20,000 COLONIES/mL ENTEROCOCCUS FAECALIS SUSCEPTIBILITIES TO FOLLOW Performed at [facility], [place] [address] [place], [place] [address]    Report Status PENDING  Incomplete  Blood culture (routine x 2)     Status: None (Preliminary result)   Collection Time: 08/21/20  8:14 AM   Specimen: BLOOD RIGHT ARM  Result Value Ref Range Status   Specimen Description   Final    BLOOD RIGHT ARM Performed at [facility], [place] [address], [place], [place] [address]    Special Requests   Final    BOTTLES DRAWN AEROBIC ONLY Blood Culture results may not be optimal due to an inadequate volume of blood received in culture bottles Performed at [place] [address] [place], [place] [address]  Culture   Final    NO GROWTH < 24 HOURS Performed at [facility], [place] [address] [place], [place] [address]    Report Status PENDING  Incomplete  Respiratory Panel by RT  PCR (Flu A&B, Covid) - Nasopharyngeal Swab     Status: None   Collection Time: 08/21/20  8:27 AM   Specimen: Nasopharyngeal Swab  Result Value Ref Range Status   SARS Coronavirus 2 by RT PCR NEGATIVE NEGATIVE Final    Comment: (NOTE) SARS-CoV-2 target nucleic acids are NOT DETECTED.  The SARS-CoV-2 RNA is generally detectable in upper respiratoy specimens during the acute phase of infection. The lowest concentration of SARS-CoV-2 viral copies this assay can detect is 131 copies/mL. A negative result does not preclude SARS-Cov-2 infection and should not be used as the sole basis for treatment or other patient management decisions. A negative result may occur with  improper specimen collection/handling, submission of specimen other than nasopharyngeal swab, presence of viral mutation(s) within the areas targeted by this assay, and inadequate number of viral copies (<131 copies/mL). A negative result must be combined with clinical observations, patient history, and epidemiological information. The expected result is Negative.  Fact Sheet for Patients:  PinkCheek.be  Fact Sheet for Healthcare Providers:  GravelBags.it  This test is no t yet approved or cleared by the [place] FDA and  has been authorized for detection and/or diagnosis of SARS-CoV-2 by FDA under an Emergency Use Authorization (EUA). This EUA will remain  in effect (meaning this test can be used) for the duration of the COVID-19 declaration under Section 564(b)(1) of the Act, 21 U.S.C. section 360bbb-3(b)(1), unless the authorization is terminated or revoked sooner.     Influenza A by PCR NEGATIVE NEGATIVE Final   Influenza B by PCR NEGATIVE NEGATIVE Final    Comment: (NOTE) The Xpert Xpress SARS-CoV-2/FLU/RSV assay is intended as an aid in  the diagnosis of influenza from Nasopharyngeal swab specimens and  should not be used as a sole basis for  treatment. Nasal washings and  aspirates are unacceptable for Xpert Xpress SARS-CoV-2/FLU/RSV  testing.  Fact Sheet for Patients: PinkCheek.be  Fact Sheet for Healthcare Providers: GravelBags.it  This test is not yet approved or cleared by the [place] FDA and  has been authorized for detection and/or diagnosis of SARS-CoV-2 by  FDA under an Emergency Use Authorization (EUA). This EUA will remain  in effect (meaning this test can be used) for the duration of the  Covid-19 declaration under Section 564(b)(1) of the Act, 21  U.S.C. section 360bbb-3(b)(1), unless the authorization is  terminated or revoked. Performed at [facility], [place] [address] [place], [place] [address]       Radiology Studies: CT Chest W Contrast  Result Date: 08/21/2020 CLINICAL DATA:  Pneumothorax.  Altered mental status. EXAM: CT CHEST, ABDOMEN, AND PELVIS WITH CONTRAST TECHNIQUE: Multidetector CT imaging of the chest, abdomen and pelvis was performed following the standard protocol during bolus administration of intravenous contrast. CONTRAST:  150m OMNIPAQUE IOHEXOL 300 MG/ML  SOLN COMPARISON:  Abdomen/pelvis CT 06/15/2020.  CTA chest 05/16/2018. FINDINGS: CT CHEST FINDINGS Cardiovascular: The heart size is normal. No substantial pericardial effusion. No thoracic aortic aneurysm. Left PICC line tip is in the inferior right atrium. Mediastinum/Nodes: Stable appearance thymic remnant anterior mediastinum. No mediastinal lymphadenopathy. There is no hilar lymphadenopathy. The esophagus has normal imaging features. There is no axillary lymphadenopathy. Lungs/Pleura: Small left-sided pneumothorax noted. Small right pleural effusion is associated with  mild nodular ground-glass opacity in the inferior right lower lobe. Musculoskeletal: Thoracolumbar scoliosis. Diffuse loss of muscle volume consistent with history of muscular dystrophy. No  worrisome lytic or sclerotic osseous abnormality. CT ABDOMEN PELVIS FINDINGS Hepatobiliary: No suspicious focal abnormality within the liver parenchyma. There is no evidence for gallstones, gallbladder wall thickening, or pericholecystic fluid. No intrahepatic or extrahepatic biliary dilation. Pancreas: No focal mass lesion. No dilatation of the main duct. No intraparenchymal cyst. No peripancreatic edema. Spleen: No splenomegaly. No focal mass lesion. Adrenals/Urinary Tract: No adrenal nodule or mass. Tiny nonobstructing stone noted interpolar right kidney. Left kidney unremarkable. No evidence for hydroureter. The urinary bladder appears normal for the degree of distention. Stomach/Bowel: Stomach is unremarkable. No gastric wall thickening. No evidence of outlet obstruction. Diffuse gaseous distention of small bowel and colon noted with left abdominal end colostomy. The colon tracking to the stoma is decompressed but does contain some fluid raising the possibility of diarrhea. Vascular/Lymphatic: No abdominal aortic aneurysm. There is no gastrohepatic or hepatoduodenal ligament lymphadenopathy. No retroperitoneal or mesenteric lymphadenopathy. No pelvic sidewall lymphadenopathy. Reproductive: The prostate gland and seminal vesicles are unremarkable. Other: No intraperitoneal free fluid. Musculoskeletal: Bones are diffusely demineralized. No worrisome lytic or sclerotic osseous abnormality. IMPRESSION: 1. Small left-sided pneumothorax. 2. Small right pleural effusion associated with mild nodular ground-glass opacity in the inferior right lower lobe. Infectious/inflammatory alveolitis likely. 3. Diffuse gaseous distention of small bowel and colon with left abdominal end colostomy. No abrupt colonic transition zone and the colon tracking to the stoma is decompressed but contains some fluid raising the possibility of diarrhea. 4. Tiny nonobstructing right renal stone. Electronically Signed   By: Misty Stanley M.D.    On: 08/21/2020 10:36   CT ABDOMEN PELVIS W CONTRAST  Result Date: 08/21/2020 CLINICAL DATA:  Pneumothorax.  Altered mental status. EXAM: CT CHEST, ABDOMEN, AND PELVIS WITH CONTRAST TECHNIQUE: Multidetector CT imaging of the chest, abdomen and pelvis was performed following the standard protocol during bolus administration of intravenous contrast. CONTRAST:  142m OMNIPAQUE IOHEXOL 300 MG/ML  SOLN COMPARISON:  Abdomen/pelvis CT 06/15/2020.  CTA chest 05/16/2018. FINDINGS: CT CHEST FINDINGS Cardiovascular: The heart size is normal. No substantial pericardial effusion. No thoracic aortic aneurysm. Left PICC line tip is in the inferior right atrium. Mediastinum/Nodes: Stable appearance thymic remnant anterior mediastinum. No mediastinal lymphadenopathy. There is no hilar lymphadenopathy. The esophagus has normal imaging features. There is no axillary lymphadenopathy. Lungs/Pleura: Small left-sided pneumothorax noted. Small right pleural effusion is associated with mild nodular ground-glass opacity in the inferior right lower lobe. Musculoskeletal: Thoracolumbar scoliosis. Diffuse loss of muscle volume consistent with history of muscular dystrophy. No worrisome lytic or sclerotic osseous abnormality. CT ABDOMEN PELVIS FINDINGS Hepatobiliary: No suspicious focal abnormality within the liver parenchyma. There is no evidence for gallstones, gallbladder wall thickening, or pericholecystic fluid. No intrahepatic or extrahepatic biliary dilation. Pancreas: No focal mass lesion. No dilatation of the main duct. No intraparenchymal cyst. No peripancreatic edema. Spleen: No splenomegaly. No focal mass lesion. Adrenals/Urinary Tract: No adrenal nodule or mass. Tiny nonobstructing stone noted interpolar right kidney. Left kidney unremarkable. No evidence for hydroureter. The urinary bladder appears normal for the degree of distention. Stomach/Bowel: Stomach is unremarkable. No gastric wall thickening. No evidence of outlet  obstruction. Diffuse gaseous distention of small bowel and colon noted with left abdominal end colostomy. The colon tracking to the stoma is decompressed but does contain some fluid raising the possibility of diarrhea. Vascular/Lymphatic: No abdominal aortic aneurysm. There is no gastrohepatic or  hepatoduodenal ligament lymphadenopathy. No retroperitoneal or mesenteric lymphadenopathy. No pelvic sidewall lymphadenopathy. Reproductive: The prostate gland and seminal vesicles are unremarkable. Other: No intraperitoneal free fluid. Musculoskeletal: Bones are diffusely demineralized. No worrisome lytic or sclerotic osseous abnormality. IMPRESSION: 1. Small left-sided pneumothorax. 2. Small right pleural effusion associated with mild nodular ground-glass opacity in the inferior right lower lobe. Infectious/inflammatory alveolitis likely. 3. Diffuse gaseous distention of small bowel and colon with left abdominal end colostomy. No abrupt colonic transition zone and the colon tracking to the stoma is decompressed but contains some fluid raising the possibility of diarrhea. 4. Tiny nonobstructing right renal stone. Electronically Signed   By: Misty Stanley M.D.   On: 08/21/2020 10:36   DG CHEST PORT 1 VIEW  Result Date: 08/22/2020 CLINICAL DATA:  Pneumothorax EXAM: PORTABLE CHEST 1 VIEW COMPARISON:  CT from yesterday FINDINGS: Hazy opacity over the right upper chest correlating with pleural fluid on CT yesterday. Small left apical pneumothorax, 14 mm craniocaudal, unchanged. Lung volumes remain low. Normal heart size. IMPRESSION: 1. Unchanged small left apical pneumothorax. 2. Unchanged hazy right chest opacification correlating with pleural fluid by CT. Electronically Signed   By: Monte Fantasia M.D.   On: 08/22/2020 04:40   DG Chest Port 1 View  Result Date: 08/21/2020 CLINICAL DATA:  Hypoglycemia and altered mental status EXAM: PORTABLE CHEST 1 VIEW COMPARISON:  June 19, 2020 and Feb 08, 2020 FINDINGS:  Apparent scarring right upper lobe is again noted. Lungs elsewhere are clear. Heart size and pulmonary vascularity are normal. No adenopathy. Central catheter tip is in the right atrium. There is a focal apical pneumothorax without tension component. No bone lesions. IMPRESSION: Left apical pneumothorax without tension component. Central catheter tip in right atrium. Apparent scarring right upper lobe. No consolidation or edema. Heart size normal. Critical Value/emergent results were called by telephone at the time of interpretation on 08/21/2020 at 8:37 am to provider [facility] , who verbally acknowledged these results. Electronically Signed   By: Lowella Grip III M.D.   On: 08/21/2020 08:37   DG Abdomen Acute W/Chest  Result Date: 08/20/2020 CLINICAL DATA:  Fever, abdominal distension, nausea EXAM: DG ABDOMEN ACUTE WITH 1 VIEW CHEST COMPARISON:  08/27/2018 FINDINGS: Supine and upright frontal views of the abdomen as well as an upright frontal view of the chest are obtained. Left-sided PICC tip overlies superior vena cava. The cardiac silhouette is stable. Right suprahilar airspace disease compatible with bronchopneumonia. No effusion or pneumothorax. Diffuse gaseous distention of bowel without obstruction or ileus. No masses or abnormal calcifications. No free gas in the greater peritoneal sac. IMPRESSION: 1. Right suprahilar airspace disease consistent with bronchopneumonia. 2. Diffuse gaseous distension of large and small bowel, without signs of obstruction or ileus. Electronically Signed   By: Randa Ngo M.D.   On: 08/20/2020 15:56   ECHOCARDIOGRAM COMPLETE  Result Date: 08/21/2020    ECHOCARDIOGRAM REPORT   Patient Name:   HILLEL CARD Date of Exam: 08/21/2020 Medical Rec #:  [id number]  Height:       62.0 in Accession #:    [id number] Weight:       90.0 lb Date of Birth:  1996/07/02  BSA:          1.362 m Patient Age:    24 years   BP:           98/56 mmHg Patient Gender: M          HR:  95 bpm. Exam Location:  Inpatient Procedure: 2D Echo, Cardiac Doppler and Color Doppler                         STAT ECHO Reported to: Dr. Kirk Ruths on 08/21/2020 3:18:00 PM. Indications:    Bacteremia 790.7 / R78.81  History:        Patient has prior history of Echocardiogram examinations, most                 recent 06/17/2020. Sepsis. Hypothyroidism.  Sonographer:    Tiffany Dance Referring Phys: [id number] Harold Hedge  Sonographer Comments: No subcostal window. Image acquisition challenging due to patient body habitus. IMPRESSIONS  1. Left ventricular ejection fraction, by estimation, is 60 to 65%. The left ventricle has normal function. The left ventricle has no regional wall motion abnormalities. Left ventricular diastolic parameters were normal.  2. Right ventricular systolic function is normal. The right ventricular size is normal. There is normal pulmonary artery systolic pressure. The estimated right ventricular systolic pressure is 82.6 mmHg.  3. There is a linear echodensity in the right atrium prolapsing across the tricuspid valve that is likely an intravenous catheter, not a vegetation. It does not appear to be attached to the tricuspid valve, but probably arises from the superior vena cava.  4. The mitral valve is normal in structure. No evidence of mitral valve regurgitation. No evidence of mitral stenosis.  5. The aortic valve is normal in structure. Aortic valve regurgitation is not visualized. No aortic stenosis is present.  6. The inferior vena cava is normal in size with greater than 50% respiratory variability, suggesting right atrial pressure of 3 mmHg. Conclusion(s)/Recommendation(s): No evidence of valvular vegetations on this transthoracic echocardiogram. Would recommend a transesophageal echocardiogram to exclude infective endocarditis if clinically indicated. FINDINGS  Left Ventricle: Left ventricular ejection fraction, by estimation, is 60 to 65%. The left ventricle has  normal function. The left ventricle has no regional wall motion abnormalities. The left ventricular internal cavity size was normal in size. There is  no left ventricular hypertrophy. Left ventricular diastolic parameters were normal. Normal left ventricular filling pressure. Right Ventricle: The right ventricular size is normal. No increase in right ventricular wall thickness. Right ventricular systolic function is normal. There is normal pulmonary artery systolic pressure. The tricuspid regurgitant velocity is 2.11 m/s, and  with an assumed right atrial pressure of 8 mmHg, the estimated right ventricular systolic pressure is 41.5 mmHg. Left Atrium: Left atrial size was normal in size. Right Atrium: There is a linear echodensity in the right atrium prolapsing across the tricuspid valve that is likely an intravenous catheter, not a vegetation. It does not appear to be attached to the tricuspid valve, but probably arises from the superior vena cava. Right atrial size was normal in size. Pericardium: There is no evidence of pericardial effusion. Mitral Valve: The mitral valve is normal in structure. No evidence of mitral valve regurgitation. No evidence of mitral valve stenosis. Tricuspid Valve: The tricuspid valve is normal in structure. Tricuspid valve regurgitation is mild . No evidence of tricuspid stenosis. Aortic Valve: The aortic valve is normal in structure. Aortic valve regurgitation is not visualized. No aortic stenosis is present. Pulmonic Valve: The pulmonic valve was normal in structure. Pulmonic valve regurgitation is not visualized. No evidence of pulmonic stenosis. Aorta: The aortic root is normal in size and structure. Venous: The inferior vena cava is normal in size with greater than 50% respiratory  variability, suggesting right atrial pressure of 3 mmHg. IAS/Shunts: No atrial level shunt detected by color flow Doppler.  LEFT VENTRICLE PLAX 2D LVIDd:         3.70 cm  Diastology LVIDs:         2.70 cm   LV e' medial:    8.81 cm/s LV PW:         0.80 cm  LV E/e' medial:  10.1 LV IVS:        0.70 cm  LV e' lateral:   9.90 cm/s LVOT diam:     1.70 cm  LV E/e' lateral: 8.9 LV SV:         28 LV SV Index:   20 LVOT Area:     2.27 cm  RIGHT VENTRICLE RV Basal diam:  2.60 cm RV S prime:     9.14 cm/s TAPSE (M-mode): 1.0 cm LEFT ATRIUM           Index       RIGHT ATRIUM          Index LA diam:      3.10 cm 2.28 cm/m  RA Area:     6.39 cm LA Vol (A2C): 22.0 ml 16.16 ml/m RA Volume:   11.20 ml 8.23 ml/m LA Vol (A4C): 22.7 ml 16.67 ml/m  AORTIC VALVE LVOT Vmax:   70.10 cm/s LVOT Vmean:  49.000 cm/s LVOT VTI:    0.122 m  AORTA Ao Root diam: 2.30 cm Ao Asc diam:  2.00 cm MITRAL VALVE               TRICUSPID VALVE MV Area (PHT): 3.31 cm    TR Peak grad:   17.8 mmHg MV Decel Time: 229 msec    TR Vmax:        211.00 cm/s MV E velocity: 88.60 cm/s MV A velocity: 83.90 cm/s  SHUNTS MV E/A ratio:  1.06        Systemic VTI:  0.12 m                            Systemic Diam: 1.70 cm Dani Gobble Croitoru MD Electronically signed by Sanda Klein MD Signature Date/Time: 08/21/2020/3:29:58 PM    Final    US Abdomen Limited RUQ (LIVER/GB)  Result Date: 08/21/2020 CLINICAL DATA:  Abnormal liver function tests. EXAM: ULTRASOUND ABDOMEN LIMITED RIGHT UPPER QUADRANT COMPARISON:  CT abdomen and pelvis 08/21/2020. Right upper quadrant abdominal ultrasound 06/14/2020. FINDINGS: Gallbladder: No gallstones or wall thickening visualized. No sonographic Murphy sign noted by sonographer. Common bile duct: Diameter: 4 mm Liver: Within normal limits in parenchymal echogenicity. No focal liver lesion is identified, however the left lobe was largely obscured by bowel gas and rib shadows. Portal vein is patent on color Doppler imaging with normal direction of blood flow towards the liver. Other: None. IMPRESSION: Poor visualization of the left hepatic lobe, otherwise unremarkable right upper quadrant ultrasound. Electronically Signed   By: Logan Bores M.D.   On: 08/21/2020 13:52    Scheduled Meds: . chlorhexidine  15 mL Mouth Rinse BID  . enoxaparin (LOVENOX) injection  30 mg Subcutaneous Q24H  . magnesium oxide  400 mg Oral TID  . mouth rinse  15 mL Mouth Rinse q12n4p  . metoprolol tartrate  25 mg Oral Q8H  . scopolamine  1 patch Transdermal Q72H  . sodium chloride flush  3 mL Intravenous Q12H   Continuous Infusions: .  ceFAZolin (  ANCEF) IV 2 g (08/22/20 1343)  . dextrose       LOS: 1 day   Time spent: 35 minutes.  Patrecia Pour, MD [facility] [email or web address] 08/22/2020, 2:20 PM

## 2020-08-22 NOTE — Progress Notes (Signed)
Pharmacy Antibiotic Note  Peter Hayes is a 24 y.o. male admitted on 08/21/2020 with MSSA bacteremia & aspiration PNA.  Pharmacy has been consulted for zosyn dosing.  Pt on home TPN via PICC line. PICC to be pulled, repeat BCx ordered, TTE ordered.  Plan: Abx back to Ancef 2gm q8 per ID recommendation for MSSA F/u renal function, WBC, temp, culture data  Weight: 40.8 kg (89 lb 15.2 oz)  Temp (24hrs), Avg:98.5 F (36.9 C), Min:97.7 F (36.5 C), Max:100.6 F (38.1 C)  Recent Labs  Lab 08/20/20 1616 08/20/20 1848 08/21/20 0814 08/21/20 1305 08/21/20 1724 08/22/20 0413  WBC 7.2  --  7.6  --   --  2.8*  CREATININE <0.30*  --  <0.30* <0.30*  --  <0.30*  LATICACIDVEN 0.6 0.6 1.3  --  1.6  --     CrCl cannot be calculated (This lab value cannot be used to calculate CrCl because it is not a number: <0.30).    Allergies  Allergen Reactions  . Fentanyl Other (See Comments)    Dizziness   Antimicrobials this admission: 11/14 ancef x 1 dose >> resumed 11/15 per ID 11/14 zosyn>> 11/15 Dose adjustments this admission:  Microbiology results: 11/13 BCx1 PICC: both bottles MSSA 11/13 UCx: 60K GNR 11/13 Covid/flu neg 11/14 UCx: sent 11/14 BCx aerobic only: sent 11/13 BCx1 PICC: both bottles MSSA  Thank you for allowing pharmacy to be a part of this patient's care.  Minda Ditto PharmD [facility] [phone] 08/22/2020, 9:29 AM

## 2020-08-22 NOTE — TOC Initial Note (Signed)
Transition of Care [facility]) - Initial/Assessment Note    Patient Details  Name: Peter Hayes MRN: [id number] Date of Birth: [birth year]  Transition of Care (TOC) CM/SW Contact:    Joaquin Courts, RN Phone Number: 08/22/2020, 1:02 PM  Clinical Narrative:    CM spoke with MD during morning rounds, patient may require home IV antibiotics.  Carolynn Sayers with [employer] given referral and will follow patient.                 Expected Discharge Plan: [place] Barriers to Discharge: Continued Medical Work up   Patient Goals and CMS Choice Patient states their goals for this hospitalization and ongoing recovery are:: to return home      Expected Discharge Plan and Services Expected Discharge Plan: [place]   Discharge Planning Services: CM Consult   Living arrangements for the past 2 months: Single Family Home                           HH Arranged: IV Antibiotics HH Agency: [facility] Date HH Agency Contacted: 08/22/20 Time HH Agency Contacted: 1301 Representative spoke with at [place]: Carolynn Sayers  Prior Living Arrangements/Services Living arrangements for the past 2 months: [place] Lives with:: Parents Patient language and need for interpreter reviewed:: Yes Do you feel safe going back to the place where you live?: Yes      Need for Family Participation in Patient Care: Yes (Comment) Care giver support system in place?: Yes (comment)   Criminal Activity/Legal Involvement Pertinent to Current Situation/Hospitalization: No - Comment as needed  Activities of Daily Living   ADL Screening (condition at time of admission) Patient's cognitive ability adequate to safely complete daily activities?: Yes Is the patient deaf or have difficulty hearing?: No Does the patient have difficulty seeing, even when wearing glasses/contacts?: No Does the patient have difficulty concentrating, remembering, or making decisions?: No Patient  able to express need for assistance with ADLs?: Yes Does the patient have difficulty dressing or bathing?: Yes Independently performs ADLs?: No Communication: Independent Dressing (OT): Dependent Is this a change from baseline?: Pre-admission baseline Grooming: Dependent Is this a change from baseline?: Pre-admission baseline Feeding: Needs assistance Is this a change from baseline?: Pre-admission baseline Bathing: Dependent Is this a change from baseline?: Pre-admission baseline Toileting: Dependent Is this a change from baseline?: Pre-admission baseline In/Out Bed: Dependent Is this a change from baseline?: Pre-admission baseline Walks in Home: Dependent Is this a change from baseline?: Pre-admission baseline Does the patient have difficulty walking or climbing stairs?: Yes Weakness of Legs: Both Weakness of Arms/Hands: Both  Permission Sought/Granted                  Emotional Assessment Appearance:: Appears stated age Attitude/Demeanor/Rapport: Engaged Affect (typically observed): Accepting Orientation: : Oriented to Self, Oriented to Place, Oriented to  Time, Oriented to Situation   Psych Involvement: No (comment)  Admission diagnosis:  Bacteremia [R78.81] Hypokalemia [E87.6] Hyperbilirubinemia [E80.6] Hypoglycemia [E16.2] Transaminitis [R74.01] LFTs abnormal [R94.5] Pneumothorax [J93.9] Pneumothorax, unspecified type [J93.9] Patient Active Problem List   Diagnosis Date Noted  . Bacteremia 08/21/2020  . Abnormal LFTs 08/21/2020  . Acute metabolic encephalopathy 51/70/0174  . Pneumothorax on left 08/21/2020  . Sepsis ([place]) 06/14/2020  . Pneumomediastinum ([place]) 02/06/2020  . Pressure injury of skin 09/17/2019  . 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 Hypoglycemia without diagnosis of  diabetes mellitus 09/24/2017  . Muscular dystrophy ([place]) 05/05/2016  . Moderate protein-calorie malnutrition ([place]) 05/05/2016   PCP:  Penni Bombard, PA Pharmacy:   [facility] - [place], [address] [place] [address] Phone: [phone] Fax: [place], [address] [place] [address] Phone: [phone] Fax: [phone]  [id number] - [place], [place] - [address] AT [address] [place] [address] Phone: [phone] Fax: [phone]     Social Determinants of Health (SDOH) Interventions    Readmission Risk Interventions No flowsheet data found.

## 2020-08-22 NOTE — Evaluation (Addendum)
Clinical/Bedside Swallow Evaluation Patient Details  Name: Peter Hayes MRN: [id number] Date of Birth: [birth year]  Today's Date: 08/22/2020 Time: SLP Start Time (ACUTE ONLY): 8469 SLP Stop Time (ACUTE ONLY): 1640 SLP Time Calculation (min) (ACUTE ONLY): 35 min  Past Medical History:  Past Medical History:  Diagnosis Date  . Acute respiratory failure with hypoxia and hypercapnia ([place]) 05/16/2018  . HCAP (healthcare-associated pneumonia) 12/01/2017  . High anion gap metabolic acidosis 04/06/5283  . Hypotension   . Ileus ([place]) 09/23/2017  . Leukocytosis 05/05/2016  . MD (muscular dystrophy) ([place])   . Refusal of blood product    patient is Peter Hayes witness  . Respiratory alkalosis 05/05/2016  . Sepsis ([place]) 05/05/2016  . Severe sepsis ([place]) 09/16/2019  . Sinus tachycardia 05/05/2016   Past Surgical History:  Past Surgical History:  Procedure Laterality Date  . COLOSTOMY    . EYE SURGERY    . FLEXIBLE SIGMOIDOSCOPY N/A 01/10/2018   Procedure: FLEXIBLE SIGMOIDOSCOPY;  Surgeon: Laurence Spates, MD;  Location: [facility];  Service: Endoscopy;  Laterality: N/A;  . SMALL INTESTINE SURGERY     HPI:  Pt is a 24 yo with muscular dystrophy adm to [facility] with respiratory diffiuclties.  Pt has required essentially full bipap use over the last 2 months per his statement.  He has also been consuming blenderized foods over the last few months due to his worsening dysphagia to solids/thicker consistencies.  Swallow evaluation ordered.   CXR showed Unchanged small left apical pneumothorax. Unchanged hazy right chest opacification correlating with pleuralfluid by CT.   Assessment / Plan / Recommendation Clinical Impression  Pt admits to increased problems swallowing solids.  He had been losing weight and reports he is on Bipap essentially all of the time except for po intake over the last 25months.  And he was losing weight prior to needing Bipap consistently.  Pt reports weight loss was due to getting full quickly.     SLP is concerned with having to don/remove Bipap between every bite, requiring several swallows per bite/sip, and level of dysphagia that pt will not meet nutritional needs.    Advised pt and father to chronicity of dysphagia with malnutrition and aspiration risk with increaseed pulmonary ramification with her MD as pt reports ER visits due to needing suctioning of secretions he could not clear from airway.   Pt does not want milk based products due to increase in secretions, therefore Ensure, etc would not be applicable to his diet.    Observed pt with straw bolus of thin, blenderized food *nectar thick essentially* and Boost Breeze.  Multiple swallows per straw bolus noted and subtle delayed cough x1.  Pt denies sensing aspiration and states he has a cough anyway.    Recommend clears from kitchen- NO jello, Boost Breeze TID and family bring in his po that is blenderized (like a nectar thick liquid).    Father today had blenderized meats/vegetables to cream soup consistency.  Pt's dad agreeable to bring in blenderized food for pt to help maximize nutrition.   Given pt's pharyngeal retention on prior MBS and his report of increasing difficulties with solids, thick items, do not advise puree.  Pt admits to increased ease of swallowing with liquids compared to solids/purees.  States his swallowing ability with liquids is the same as in May 2021.Marland Kitchen    Do not suspect repeat MBS would change outcomes for pt but defer to MD for further testing plan.  Will follow up briefly for education, po tolerance.  SLP Visit Diagnosis: Dysphagia, oropharyngeal phase (R13.12)    Aspiration Risk  Moderate aspiration risk;Risk for inadequate nutrition/hydration    Diet Recommendation Thin liquid (family to bring in blenderized near liquid food from home)   Liquid Administration via: Straw; Medication Administration: Crushed with nectar Supervision: Comment (father ok to feed pt)    Other  Recommendations  Oral Care Recommendations: Oral care before and after PO   Follow up Recommendations Other (comment) (TBD)      Frequency and Duration min 1 x/week  2 weeks       Prognosis Prognosis for Safe Diet Advancement: Fair      Swallow Study   General Date of Onset: 08/22/20 HPI: Pt is a 24 yo with muscular dystrophy adm to [facility] with respiratory diffiuclties.  Pt has required essentially full bipap use over the last 2 months per his statement.  He has also been consuming blenderized foods over the last few months due to his worsening dysphagia to solids/thicker consistencies.  Swallow evaluation ordered.   CXR showed Unchanged small left apical pneumothorax. Unchanged hazy right chest opacification correlating with pleuralfluid by CT. Type of Study: Bedside Swallow Evaluation Previous Swallow Assessment: MBS 02/2020 lingual pumping, decreased pharyngeal squeeze, retention, dry swallows helpful to decrease retention.  Recommended regular/thin with precautions. Diet Prior to this Study: NPO (dad brought blenderized food from home that is essentially thick drink) Temperature Spikes Noted: No Respiratory Status: Other (comment) (chronic bipap) History of Recent Intubation: No Oral Care Completed by SLP: No Oral Cavity - Dentition: Adequate natural dentition Self-Feeding Abilities: Total assist Patient Positioning: Partially reclined Baseline Vocal Quality: Low vocal intensity Volitional Cough: Weak Volitional Swallow:  (pt xerostomic)    Oral/Motor/Sensory Function Overall Oral Motor/Sensory Function: Generalized oral weakness   Ice Chips Ice chips: Not tested   Thin Liquid Thin Liquid: Impaired Presentation: Straw Oral Phase Impairments: Reduced lingual movement/coordination Oral Phase Functional Implications: Other (comment) (potential lingual thrusitng as observed on prior mbs) Pharyngeal  Phase Impairments: Suspected delayed Swallow;Multiple swallows    Nectar Thick Nectar Thick Liquid:  Impaired Presentation: Straw Oral Phase Impairments: Reduced lingual movement/coordination Oral phase functional implications: Other (comment) (potential lingual thrusting as observed on prior mbs 02/2020) Pharyngeal Phase Impairments: Multiple swallows;Suspected delayed Swallow   Honey Thick Honey Thick Liquid: Not tested   Puree Puree: Not tested   Solid     Solid: Not tested      Macario Golds 08/22/2020,4:56 PM   Kathleen Lime, MS [facility] SLP Acute Rehab Services Office [phone] Pager [phone]

## 2020-08-22 NOTE — Progress Notes (Signed)
Pt. seen on Bipap/CPAP rounds, father remains at bedside, RT staff familiar with pt. who is currently on 2 lpm Oxygen added into circuit and is tolerating well, reminded to notify if needed.

## 2020-08-22 NOTE — Progress Notes (Signed)
PHARMACY - PHYSICIAN COMMUNICATION CRITICAL VALUE ALERT - BLOOD CULTURE IDENTIFICATION (BCID)  Peter Hayes is an 24 y.o. male who presented to [facility] on 08/21/2020 Chief Complaint  Patient presents with  . Altered Mental Status    Brought in by EMS from home blood sugar 59, AMS, ventilator dependent, given D510 prior to arrival through central line     Assessment:  Gram positive cocci in clusters in aerobic bottle only, Staphylococcus species detected, Meth resistant mecA/C and MREJ not detected  Name of physician (or Provider) Contacted: M. Sharlet Salina  Current antibiotics: Cefazolin 2g IV q8h  Changes to prescribed antibiotics recommended:  Patient is on recommended antibiotics - No changes needed  Results for orders placed or performed during the hospital encounter of 08/21/20  Blood Culture ID Panel (Reflexed) (Collected: 08/21/2020  8:14 AM)  Result Value Ref Range   Enterococcus faecalis NOT DETECTED NOT DETECTED   Enterococcus Faecium NOT DETECTED NOT DETECTED   Listeria monocytogenes NOT DETECTED NOT DETECTED   Staphylococcus species DETECTED (A) NOT DETECTED   Staphylococcus aureus (BCID) DETECTED (A) NOT DETECTED   Staphylococcus epidermidis NOT DETECTED NOT DETECTED   Staphylococcus lugdunensis NOT DETECTED NOT DETECTED   Streptococcus species NOT DETECTED NOT DETECTED   Streptococcus agalactiae NOT DETECTED NOT DETECTED   Streptococcus pneumoniae NOT DETECTED NOT DETECTED   Streptococcus pyogenes NOT DETECTED NOT DETECTED   A.calcoaceticus-baumannii NOT DETECTED NOT DETECTED   Bacteroides fragilis NOT DETECTED NOT DETECTED   Enterobacterales NOT DETECTED NOT DETECTED   Enterobacter cloacae complex NOT DETECTED NOT DETECTED   Escherichia coli NOT DETECTED NOT DETECTED   Klebsiella aerogenes NOT DETECTED NOT DETECTED   Klebsiella oxytoca NOT DETECTED NOT DETECTED   Klebsiella pneumoniae NOT DETECTED NOT DETECTED   Proteus species NOT DETECTED NOT DETECTED    Salmonella species NOT DETECTED NOT DETECTED   Serratia marcescens NOT DETECTED NOT DETECTED   Haemophilus influenzae NOT DETECTED NOT DETECTED   Neisseria meningitidis NOT DETECTED NOT DETECTED   Pseudomonas aeruginosa NOT DETECTED NOT DETECTED   Stenotrophomonas maltophilia NOT DETECTED NOT DETECTED   Candida albicans NOT DETECTED NOT DETECTED   Candida auris NOT DETECTED NOT DETECTED   Candida glabrata NOT DETECTED NOT DETECTED   Candida krusei NOT DETECTED NOT DETECTED   Candida parapsilosis NOT DETECTED NOT DETECTED   Candida tropicalis NOT DETECTED NOT DETECTED   Cryptococcus neoformans/gattii NOT DETECTED NOT DETECTED   Meth resistant mecA/C and MREJ NOT DETECTED NOT DETECTED    Efraim Kaufmann, PharmD, BCPS 08/22/2020  7:49 PM

## 2020-08-22 NOTE — Plan of Care (Signed)
  Problem: Education: Goal: Knowledge of General Education information will improve Description: Including pain rating scale, medication(s)/side effects and non-pharmacologic comfort measures Outcome: Progressing (Pt and father)   Problem: Clinical Measurements: Goal: Ability to maintain clinical measurements within normal limits will improve Outcome: Progressing Goal: Will remain free from infection Outcome: Progressing   Problem: Elimination: Goal: Will not experience complications related to bowel motility Outcome: Progressing Goal: Will not experience complications related to urinary retention Outcome: Progressing   Problem: Skin Integrity: Goal: Risk for impaired skin integrity will decrease Outcome: Progressing

## 2020-08-22 NOTE — Progress Notes (Signed)
CRITICAL VALUE ALERT  Critical Value: potassium 2.2 Date & Time Notied:  08/22/2020 0554  Provider Notified: Kennon Holter  Orders Received/Actions [id number]

## 2020-08-23 ENCOUNTER — Inpatient Hospital Stay (HOSPITAL_COMMUNITY): Payer: Managed Care, Other (non HMO)

## 2020-08-23 DIAGNOSIS — R7881 Bacteremia: Secondary | ICD-10-CM | POA: Diagnosis not present

## 2020-08-23 LAB — URINE CULTURE
Culture: 60000 — AB
Culture: 20000 — AB

## 2020-08-23 LAB — CBC WITH DIFFERENTIAL/PLATELET
Abs Immature Granulocytes: 0.02 10*3/uL (ref 0.00–0.07)
Basophils Absolute: 0 10*3/uL (ref 0.0–0.1)
Basophils Relative: 0 %
Eosinophils Absolute: 0 10*3/uL (ref 0.0–0.5)
Eosinophils Relative: 0 %
HCT: 33.3 % — ABNORMAL LOW (ref 39.0–52.0)
Hemoglobin: 10.8 g/dL — ABNORMAL LOW (ref 13.0–17.0)
Immature Granulocytes: 1 %
Lymphocytes Relative: 42 %
Lymphs Abs: 1.3 10*3/uL (ref 0.7–4.0)
MCH: 26.9 pg (ref 26.0–34.0)
MCHC: 32.4 g/dL (ref 30.0–36.0)
MCV: 82.8 fL (ref 80.0–100.0)
Monocytes Absolute: 0.4 10*3/uL (ref 0.1–1.0)
Monocytes Relative: 12 %
Neutro Abs: 1.4 10*3/uL — ABNORMAL LOW (ref 1.7–7.7)
Neutrophils Relative %: 45 %
Platelets: 87 10*3/uL — ABNORMAL LOW (ref 150–400)
RBC: 4.02 MIL/uL — ABNORMAL LOW (ref 4.22–5.81)
RDW: 15.2 % (ref 11.5–15.5)
WBC: 3.2 10*3/uL — ABNORMAL LOW (ref 4.0–10.5)
nRBC: 0 % (ref 0.0–0.2)

## 2020-08-23 LAB — FERRITIN: Ferritin: 341 ng/mL — ABNORMAL HIGH (ref 24–336)

## 2020-08-23 LAB — GLUCOSE, CAPILLARY
Glucose-Capillary: 101 mg/dL — ABNORMAL HIGH (ref 70–99)
Glucose-Capillary: 101 mg/dL — ABNORMAL HIGH (ref 70–99)
Glucose-Capillary: 104 mg/dL — ABNORMAL HIGH (ref 70–99)
Glucose-Capillary: 106 mg/dL — ABNORMAL HIGH (ref 70–99)
Glucose-Capillary: 109 mg/dL — ABNORMAL HIGH (ref 70–99)
Glucose-Capillary: 109 mg/dL — ABNORMAL HIGH (ref 70–99)
Glucose-Capillary: 125 mg/dL — ABNORMAL HIGH (ref 70–99)
Glucose-Capillary: 80 mg/dL (ref 70–99)
Glucose-Capillary: 89 mg/dL (ref 70–99)
Glucose-Capillary: 98 mg/dL (ref 70–99)
Glucose-Capillary: 99 mg/dL (ref 70–99)

## 2020-08-23 LAB — COMPREHENSIVE METABOLIC PANEL
ALT: 506 U/L — ABNORMAL HIGH (ref 0–44)
AST: 233 U/L — ABNORMAL HIGH (ref 15–41)
Albumin: 3.3 g/dL — ABNORMAL LOW (ref 3.5–5.0)
Alkaline Phosphatase: 190 U/L — ABNORMAL HIGH (ref 38–126)
Anion gap: 7 (ref 5–15)
BUN: <5 mg/dL — ABNORMAL LOW (ref 6–20)
CO2: 26 mmol/L (ref 22–32)
Calcium: 8.3 mg/dL — ABNORMAL LOW (ref 8.9–10.3)
Chloride: 100 mmol/L (ref 98–111)
Creatinine, Ser: <0.3 mg/dL — ABNORMAL LOW (ref 0.61–1.24)
Glucose, Bld: 101 mg/dL — ABNORMAL HIGH (ref 70–99)
Potassium: 3.8 mmol/L (ref 3.5–5.1)
Sodium: 133 mmol/L — ABNORMAL LOW (ref 135–145)
Total Bilirubin: 0.8 mg/dL (ref 0.3–1.2)
Total Protein: 6.5 g/dL (ref 6.5–8.1)

## 2020-08-23 LAB — FOLATE: Folate: 21.5 ng/mL (ref >5.9)

## 2020-08-23 LAB — CULTURE, BLOOD (ROUTINE X 2): Special Requests: ADEQUATE

## 2020-08-23 LAB — IRON AND TIBC
Iron: 19 ug/dL — ABNORMAL LOW (ref 45–182)
Saturation Ratios: 8 % — ABNORMAL LOW (ref 17.9–39.5)
TIBC: 247 ug/dL — ABNORMAL LOW (ref 250–450)
UIBC: 228 ug/dL

## 2020-08-23 LAB — PROTIME-INR
INR: 1.1 (ref 0.8–1.2)
Prothrombin Time: 13.6 seconds (ref 11.4–15.2)

## 2020-08-23 LAB — RETICULOCYTES
Immature Retic Fract: 3 % (ref 2.3–15.9)
RBC.: 4.01 MIL/uL — ABNORMAL LOW (ref 4.22–5.81)
Retic Count, Absolute: 25.3 10*3/uL (ref 19.0–186.0)
Retic Ct Pct: 0.6 % (ref 0.4–3.1)

## 2020-08-23 LAB — PREALBUMIN: Prealbumin: 7 mg/dL — ABNORMAL LOW (ref 18–38)

## 2020-08-23 LAB — VITAMIN B12: Vitamin B-12: 2030 pg/mL — ABNORMAL HIGH (ref 180–914)

## 2020-08-23 MED ORDER — SIMETHICONE 40 MG/0.6ML PO SUSP
40.0000 mg | Freq: Four times a day (QID) | ORAL | Status: DC
  Administered 2020-08-23 – 2020-08-27 (×15): 40 mg via ORAL
  Filled 2020-08-23 (×19): qty 0.6

## 2020-08-23 MED ORDER — GUAIFENESIN-DM 100-10 MG/5ML PO SYRP
15.0000 mL | ORAL_SOLUTION | Freq: Four times a day (QID) | ORAL | Status: DC | PRN
  Administered 2020-08-23 – 2020-08-25 (×3): 15 mL via ORAL
  Filled 2020-08-23 (×3): qty 20

## 2020-08-23 MED ORDER — PROSOURCE PLUS PO LIQD
30.0000 mL | Freq: Two times a day (BID) | ORAL | Status: DC
  Administered 2020-08-23 – 2020-08-27 (×8): 30 mL via ORAL
  Filled 2020-08-23 (×8): qty 30

## 2020-08-23 MED ORDER — ADULT MULTIVITAMIN W/MINERALS CH
1.0000 | ORAL_TABLET | Freq: Every day | ORAL | Status: DC
  Administered 2020-08-23 – 2020-08-27 (×5): 1 via ORAL
  Filled 2020-08-23 (×5): qty 1

## 2020-08-23 NOTE — Progress Notes (Signed)
[facility] for Infectious Disease   Reason for visit: Follow up on MSSA bacteremia  Interval History: repeat blood culture again positive.  No acute events.  WBC 3.2 and remains afebrile.    Physical Exam: Constitutional:  Vitals:   08/23/20 0820 08/23/20 1231  BP: 97/68 103/62  Pulse: 87 95  Resp: 20 15  Temp: 98.1 F (36.7 C) (!) 97.5 F (36.4 C)  SpO2: 100% 99%   patient appears in NAD Respiratory: Normal respiratory effort; CTA B Cardiovascular: RRR GI: soft, nt, nd  Review of Systems: Constitutional: negative for fevers and chills Gastrointestinal: negative for nausea and diarrhea  Lab Results  Component Value Date   WBC 3.2 (L) 08/23/2020   HGB 10.8 (L) 08/23/2020   HCT 33.3 (L) 08/23/2020   MCV 82.8 08/23/2020   PLT 87 (L) 08/23/2020    Lab Results  Component Value Date   CREATININE <0.30 (L) 08/23/2020   BUN <5 (L) 08/23/2020   NA 133 (L) 08/23/2020   K 3.8 08/23/2020   CL 100 08/23/2020   CO2 26 08/23/2020    Lab Results  Component Value Date   ALT 506 (H) 08/23/2020   AST 233 (H) 08/23/2020   ALKPHOS 190 (H) 08/23/2020     Microbiology: Recent Results (from the past 240 hour(s))  Culture, blood (routine x 2)     Status: Abnormal   Collection Time: 08/20/20  4:10 PM   Specimen: BLOOD LEFT ARM  Result Value Ref Range Status   Specimen Description   Final    BLOOD LEFT ARM UPPER PICC Performed at [facility], [address] [place], [place] [address]    Special Requests   Final    BOTTLES DRAWN AEROBIC AND ANAEROBIC Blood Culture adequate volume Performed at [facility], [address] [place], [place] [address]    Culture  Setup Time   Final    GRAM POSITIVE COCCI IN BOTH AEROBIC AND ANAEROBIC BOTTLES Organism ID to follow CRITICAL RESULT CALLED TO, READ BACK BY AND VERIFIED WITH: PHRMD M LILLISON $[email or web address]$  08/21/20 BY S GEZAHEGN Performed at [facility], [address] [place] [address], [place], [place] [address]     Culture STAPHYLOCOCCUS AUREUS (A)  Final   Report Status 08/23/2020 FINAL  Final   Organism ID, Bacteria STAPHYLOCOCCUS AUREUS  Final      Susceptibility   Staphylococcus aureus - MIC*    CIPROFLOXACIN <=0.5 SENSITIVE Sensitive     ERYTHROMYCIN <=0.25 SENSITIVE Sensitive     GENTAMICIN <=0.5 SENSITIVE Sensitive     OXACILLIN 0.5 SENSITIVE Sensitive     TETRACYCLINE <=1 SENSITIVE Sensitive     VANCOMYCIN 1 SENSITIVE Sensitive     TRIMETH/SULFA <=10 SENSITIVE Sensitive     CLINDAMYCIN <=0.25 SENSITIVE Sensitive     RIFAMPIN <=0.5 SENSITIVE Sensitive     Inducible Clindamycin NEGATIVE Sensitive     * STAPHYLOCOCCUS AUREUS  Blood Culture ID Panel (Reflexed)     Status: Abnormal   Collection Time: 08/20/20  4:10 PM  Result Value Ref Range Status   Enterococcus faecalis NOT DETECTED NOT DETECTED Final   Enterococcus Faecium NOT DETECTED NOT DETECTED Final   Listeria monocytogenes NOT DETECTED NOT DETECTED Final   Staphylococcus species DETECTED (A) NOT DETECTED Final    Comment: CRITICAL RESULT CALLED TO, READ BACK BY AND VERIFIED WITH: PHRMD M LILLISON $[email or web address]'[email or web address]$  08/21/20 BY S GEZAHEGN    Staphylococcus aureus (BCID) DETECTED (A) NOT DETECTED Final  Comment: CRITICAL RESULT CALLED TO, READ BACK BY AND VERIFIED WITH: PHRMD M LILLISON $[email or web address]'[email or web address]$  08/21/20 BY S GEZAHEGN    Staphylococcus epidermidis NOT DETECTED NOT DETECTED Final   Staphylococcus lugdunensis NOT DETECTED NOT DETECTED Final   Streptococcus species NOT DETECTED NOT DETECTED Final   Streptococcus agalactiae NOT DETECTED NOT DETECTED Final   Streptococcus pneumoniae NOT DETECTED NOT DETECTED Final   Streptococcus pyogenes NOT DETECTED NOT DETECTED Final   A.calcoaceticus-baumannii NOT DETECTED NOT DETECTED Final   Bacteroides fragilis NOT DETECTED NOT DETECTED Final   Enterobacterales NOT DETECTED NOT DETECTED Final   Enterobacter cloacae complex NOT DETECTED NOT DETECTED Final   Escherichia coli NOT DETECTED NOT DETECTED  Final   Klebsiella aerogenes NOT DETECTED NOT DETECTED Final   Klebsiella oxytoca NOT DETECTED NOT DETECTED Final   Klebsiella pneumoniae NOT DETECTED NOT DETECTED Final   Proteus species NOT DETECTED NOT DETECTED Final   Salmonella species NOT DETECTED NOT DETECTED Final   Serratia marcescens NOT DETECTED NOT DETECTED Final   Haemophilus influenzae NOT DETECTED NOT DETECTED Final   Neisseria meningitidis NOT DETECTED NOT DETECTED Final   Pseudomonas aeruginosa NOT DETECTED NOT DETECTED Final   Stenotrophomonas maltophilia NOT DETECTED NOT DETECTED Final   Candida albicans NOT DETECTED NOT DETECTED Final   Candida auris NOT DETECTED NOT DETECTED Final   Candida glabrata NOT DETECTED NOT DETECTED Final   Candida krusei NOT DETECTED NOT DETECTED Final   Candida parapsilosis NOT DETECTED NOT DETECTED Final   Candida tropicalis NOT DETECTED NOT DETECTED Final   Cryptococcus neoformans/gattii NOT DETECTED NOT DETECTED Final   Meth resistant mecA/C and MREJ NOT DETECTED NOT DETECTED Final    Comment: Performed at [facility], [address], [place], [place] [address]  Respiratory Panel by RT PCR (Flu A&B, Covid) - Nasopharyngeal Swab     Status: None   Collection Time: 08/20/20  4:16 PM   Specimen: Nasopharyngeal Swab  Result Value Ref Range Status   SARS Coronavirus 2 by RT PCR NEGATIVE NEGATIVE Final    Comment: (NOTE) SARS-CoV-2 target nucleic acids are NOT DETECTED.  The SARS-CoV-2 RNA is generally detectable in upper respiratoy specimens during the acute phase of infection. The lowest concentration of SARS-CoV-2 viral copies this assay can detect is 131 copies/mL. A negative result does not preclude SARS-Cov-2 infection and should not be used as the sole basis for treatment or other patient management decisions. A negative result may occur with  improper specimen collection/handling, submission of specimen other than nasopharyngeal swab, presence of viral mutation(s) within  the areas targeted by this assay, and inadequate number of viral copies (<131 copies/mL). A negative result must be combined with clinical observations, patient history, and epidemiological information. The expected result is Negative.  Fact Sheet for Patients:  PinkCheek.be  Fact Sheet for Healthcare Providers:  GravelBags.it  This test is no t yet approved or cleared by the [place] FDA and  has been authorized for detection and/or diagnosis of SARS-CoV-2 by FDA under an Emergency Use Authorization (EUA). This EUA will remain  in effect (meaning this test can be used) for the duration of the COVID-19 declaration under Section 564(b)(1) of the Act, 21 U.S.C. section 360bbb-3(b)(1), unless the authorization is terminated or revoked sooner.     Influenza A by PCR NEGATIVE NEGATIVE Final   Influenza B by PCR NEGATIVE NEGATIVE Final    Comment: (NOTE) The Xpert Xpress SARS-CoV-2/FLU/RSV assay is intended as an aid in  the diagnosis of influenza  from Nasopharyngeal swab specimens and  should not be used as a sole basis for treatment. Nasal washings and  aspirates are unacceptable for Xpert Xpress SARS-CoV-2/FLU/RSV  testing.  Fact Sheet for Patients: PinkCheek.be  Fact Sheet for Healthcare Providers: GravelBags.it  This test is not yet approved or cleared by the [place] FDA and  has been authorized for detection and/or diagnosis of SARS-CoV-2 by  FDA under an Emergency Use Authorization (EUA). This EUA will remain  in effect (meaning this test can be used) for the duration of the  Covid-19 declaration under Section 564(b)(1) of the Act, 21  U.S.C. section 360bbb-3(b)(1), unless the authorization is  terminated or revoked. Performed at [facility], [address] [place], [place] [address]   Urine culture     Status: Abnormal   Collection  Time: 08/20/20  7:36 PM   Specimen: Urine, Random  Result Value Ref Range Status   Specimen Description   Final    URINE, RANDOM Performed at [facility], [address] [place], [place] [address]    Special Requests   Final    NONE Performed at [facility], [address] [place], [place] [address]    Culture (A)  Final    60,000 COLONIES/mL ESCHERICHIA COLI 30,000 COLONIES/mL ENTEROCOCCUS FAECALIS    Report Status 08/23/2020 FINAL  Final   Organism ID, Bacteria ESCHERICHIA COLI (A)  Final   Organism ID, Bacteria ENTEROCOCCUS FAECALIS (A)  Final      Susceptibility   Escherichia coli - MIC*    AMPICILLIN >=32 RESISTANT Resistant     CEFAZOLIN <=4 SENSITIVE Sensitive     CEFEPIME <=0.12 SENSITIVE Sensitive     CEFTRIAXONE <=0.25 SENSITIVE Sensitive     CIPROFLOXACIN >=4 RESISTANT Resistant     GENTAMICIN <=1 SENSITIVE Sensitive     IMIPENEM <=0.25 SENSITIVE Sensitive     NITROFURANTOIN <=16 SENSITIVE Sensitive     TRIMETH/SULFA <=20 SENSITIVE Sensitive     AMPICILLIN/SULBACTAM >=32 RESISTANT Resistant     PIP/TAZO <=4 SENSITIVE Sensitive     * 60,000 COLONIES/mL ESCHERICHIA COLI   Enterococcus faecalis - MIC*    AMPICILLIN <=2 SENSITIVE Sensitive     NITROFURANTOIN <=16 SENSITIVE Sensitive     VANCOMYCIN 1 SENSITIVE Sensitive     * 30,000 COLONIES/mL ENTEROCOCCUS FAECALIS  Urine culture     Status: Abnormal   Collection Time: 08/21/20  8:14 AM   Specimen: In/Out Cath Urine  Result Value Ref Range Status   Specimen Description   Final    IN/OUT CATH URINE Performed at [facility], [place] [address] [place], [place] [address]    Special Requests   Final    URINE, RANDOM Performed at [facility] [address], [place], [place] [address]    Culture (A)  Final    20,000 COLONIES/mL ESCHERICHIA COLI 20,000 COLONIES/mL ENTEROCOCCUS FAECALIS    Report Status 08/23/2020 FINAL  Final   Organism ID, Bacteria  ESCHERICHIA COLI (A)  Final   Organism ID, Bacteria ENTEROCOCCUS FAECALIS (A)  Final      Susceptibility   Escherichia coli - MIC*    AMPICILLIN >=32 RESISTANT Resistant     CEFAZOLIN 16 SENSITIVE Sensitive     CEFEPIME <=0.12 SENSITIVE Sensitive     CEFTRIAXONE <=0.25 SENSITIVE Sensitive     CIPROFLOXACIN >=4 RESISTANT Resistant     GENTAMICIN <=1 SENSITIVE Sensitive     IMIPENEM <=0.25 SENSITIVE  Sensitive     NITROFURANTOIN <=16 SENSITIVE Sensitive     TRIMETH/SULFA <=20 SENSITIVE Sensitive     AMPICILLIN/SULBACTAM >=32 RESISTANT Resistant     PIP/TAZO <=4 SENSITIVE Sensitive     * 20,000 COLONIES/mL ESCHERICHIA COLI   Enterococcus faecalis - MIC*    AMPICILLIN <=2 SENSITIVE Sensitive     NITROFURANTOIN <=16 SENSITIVE Sensitive     VANCOMYCIN 1 SENSITIVE Sensitive     * 20,000 COLONIES/mL ENTEROCOCCUS FAECALIS  Blood culture (routine x 2)     Status: Abnormal (Preliminary result)   Collection Time: 08/21/20  8:14 AM   Specimen: BLOOD RIGHT ARM  Result Value Ref Range Status   Specimen Description   Final    BLOOD RIGHT ARM Performed at [facility], [address] [place], [place] [address]    Special Requests   Final    BOTTLES DRAWN AEROBIC ONLY Blood Culture results may not be optimal due to an inadequate volume of blood received in culture bottles Performed at [facility], [address], [place], [place] [address]    Culture  Setup Time   Final    GRAM POSITIVE COCCI IN CLUSTERS AEROBIC BOTTLE ONLY Organism ID to follow CRITICAL RESULT CALLED TO, READ BACK BY AND VERIFIED WITH: Elmer Sow [facility] 6356 08/22/20 A BROWNING Performed at [facility], [address] [place], [place] [address]    Culture STAPHYLOCOCCUS AUREUS (A)  Final   Report Status PENDING  Incomplete  Blood Culture ID Panel (Reflexed)     Status: Abnormal   Collection Time: 08/21/20  8:14 AM  Result Value Ref Range Status   Enterococcus faecalis NOT DETECTED NOT  DETECTED Final   Enterococcus Faecium NOT DETECTED NOT DETECTED Final   Listeria monocytogenes NOT DETECTED NOT DETECTED Final   Staphylococcus species DETECTED (A) NOT DETECTED Final    Comment: CRITICAL RESULT CALLED TO, READ BACK BY AND VERIFIED WITH: Elmer Sow PHARMD 1911 08/22/20 A BROWNING    Staphylococcus aureus (BCID) DETECTED (A) NOT DETECTED Final    Comment: CRITICAL RESULT CALLED TO, READ BACK BY AND VERIFIED WITH: Elmer Sow PHARMD 1911 08/22/20 A BROWNING    Staphylococcus epidermidis NOT DETECTED NOT DETECTED Final   Staphylococcus lugdunensis NOT DETECTED NOT DETECTED Final   Streptococcus species NOT DETECTED NOT DETECTED Final   Streptococcus agalactiae NOT DETECTED NOT DETECTED Final   Streptococcus pneumoniae NOT DETECTED NOT DETECTED Final   Streptococcus pyogenes NOT DETECTED NOT DETECTED Final   A.calcoaceticus-baumannii NOT DETECTED NOT DETECTED Final   Bacteroides fragilis NOT DETECTED NOT DETECTED Final   Enterobacterales NOT DETECTED NOT DETECTED Final   Enterobacter cloacae complex NOT DETECTED NOT DETECTED Final   Escherichia coli NOT DETECTED NOT DETECTED Final   Klebsiella aerogenes NOT DETECTED NOT DETECTED Final   Klebsiella oxytoca NOT DETECTED NOT DETECTED Final   Klebsiella pneumoniae NOT DETECTED NOT DETECTED Final   Proteus species NOT DETECTED NOT DETECTED Final   Salmonella species NOT DETECTED NOT DETECTED Final   Serratia marcescens NOT DETECTED NOT DETECTED Final   Haemophilus influenzae NOT DETECTED NOT DETECTED Final   Neisseria meningitidis NOT DETECTED NOT DETECTED Final   Pseudomonas aeruginosa NOT DETECTED NOT DETECTED Final   Stenotrophomonas maltophilia NOT DETECTED NOT DETECTED Final   Candida albicans NOT DETECTED NOT DETECTED Final   Candida auris NOT DETECTED NOT DETECTED Final   Candida glabrata NOT DETECTED NOT DETECTED Final   Candida krusei NOT DETECTED NOT DETECTED Final  Candida parapsilosis NOT DETECTED NOT DETECTED  Final   Candida tropicalis NOT DETECTED NOT DETECTED Final   Cryptococcus neoformans/gattii NOT DETECTED NOT DETECTED Final   Meth resistant mecA/C and MREJ NOT DETECTED NOT DETECTED Final    Comment: Performed at [facility], [address], [place], [place] [address]  Respiratory Panel by RT PCR (Flu A&B, Covid) - Nasopharyngeal Swab     Status: None   Collection Time: 08/21/20  8:27 AM   Specimen: Nasopharyngeal Swab  Result Value Ref Range Status   SARS Coronavirus 2 by RT PCR NEGATIVE NEGATIVE Final    Comment: (NOTE) SARS-CoV-2 target nucleic acids are NOT DETECTED.  The SARS-CoV-2 RNA is generally detectable in upper respiratoy specimens during the acute phase of infection. The lowest concentration of SARS-CoV-2 viral copies this assay can detect is 131 copies/mL. A negative result does not preclude SARS-Cov-2 infection and should not be used as the sole basis for treatment or other patient management decisions. A negative result may occur with  improper specimen collection/handling, submission of specimen other than nasopharyngeal swab, presence of viral mutation(s) within the areas targeted by this assay, and inadequate number of viral copies (<131 copies/mL). A negative result must be combined with clinical observations, patient history, and epidemiological information. The expected result is Negative.  Fact Sheet for Patients:  PinkCheek.be  Fact Sheet for Healthcare Providers:  GravelBags.it  This test is no t yet approved or cleared by the [place] FDA and  has been authorized for detection and/or diagnosis of SARS-CoV-2 by FDA under an Emergency Use Authorization (EUA). This EUA will remain  in effect (meaning this test can be used) for the duration of the COVID-19 declaration under Section 564(b)(1) of the Act, 21 U.S.C. section 360bbb-3(b)(1), unless the authorization is terminated or revoked  sooner.     Influenza A by PCR NEGATIVE NEGATIVE Final   Influenza B by PCR NEGATIVE NEGATIVE Final    Comment: (NOTE) The Xpert Xpress SARS-CoV-2/FLU/RSV assay is intended as an aid in  the diagnosis of influenza from Nasopharyngeal swab specimens and  should not be used as a sole basis for treatment. Nasal washings and  aspirates are unacceptable for Xpert Xpress SARS-CoV-2/FLU/RSV  testing.  Fact Sheet for Patients: PinkCheek.be  Fact Sheet for Healthcare Providers: GravelBags.it  This test is not yet approved or cleared by the [place] FDA and  has been authorized for detection and/or diagnosis of SARS-CoV-2 by  FDA under an Emergency Use Authorization (EUA). This EUA will remain  in effect (meaning this test can be used) for the duration of the  Covid-19 declaration under Section 564(b)(1) of the Act, 21  U.S.C. section 360bbb-3(b)(1), unless the authorization is  terminated or revoked. Performed at [facility], [place] [address] [place], [place] [address]   Culture, blood (routine x 2)     Status: None (Preliminary result)   Collection Time: 08/23/20  7:20 AM   Specimen: BLOOD RIGHT HAND  Result Value Ref Range Status   Specimen Description   Final    BLOOD RIGHT HAND Performed at [facility] [address] [place], [place] [address]    Special Requests   Final    BOTTLES DRAWN AEROBIC ONLY Blood Culture adequate volume Performed at [facility] [address] [place],  [address]    Culture   Final    NO GROWTH < 12 HOURS Performed at [facility]  [address] [place], [place] [address]    Report Status PENDING  Incomplete  Culture, blood (routine x 2)     Status: None (Preliminary result)   Collection Time: 08/23/20  7:36 AM   Specimen: BLOOD RIGHT HAND  Result Value Ref Range Status   Specimen Description   Final    BLOOD  RIGHT HAND Performed at [facility] [address] [place], [place] [address]    Special Requests   Final    BOTTLES DRAWN AEROBIC ONLY Blood Culture adequate volume Performed at [facility] [address] [place], [place] [address]    Culture   Final    NO GROWTH < 12 HOURS Performed at [facility] [address] [place], [place] [address]    Report Status PENDING  Incomplete    Impression/Plan:  1. picc line infection - on cefazolin for MSSA bacteremia.  Repeat blood culture positive but was done while picc line was still present.  Repeat cultures already sent.  TTE without any obvious vegetation.   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  Will plan on 4 weeks of IV cefazolin  2.  Access - will wait for repeat blood cultures to assure clearance   3.  Nutrition - picc line for  TPN when bacteremia clearing.  Now considering G tube.

## 2020-08-23 NOTE — Progress Notes (Signed)
Initial Nutrition Assessment  DOCUMENTATION CODES:   Underweight  INTERVENTION:  - continue Boost Breeze TID, each supplement provides 250 kcal and 9 grams of protein. - will order 30 ml Prosource Plus BID, each supplement provides 100 kcal and 15 grams protein.  - will order 1 tablet multivitamin with minerals/day. - will continue to monitor for nutrition-related needs and recommendations.  NUTRITION DIAGNOSIS:   Increased nutrient needs related to acute illness, chronic illness as evidenced by estimated needs.  GOAL:   Patient will meet greater than or equal to 90% of their needs  MONITOR:   PO intake, Supplement acceptance, Labs, Weight trends  REASON FOR ASSESSMENT:   Consult Assessment of nutrition requirement/status  ASSESSMENT:   24 y.o. 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 He presented to the ED on 11/14 due to confusion, fevers, abdominal discomfort with mild distention, and nausea. CXR showed possible bronchopneumonia. It was noted that he had staph aureus grow out from PICC line cultures.  Patient laying in bed on BiPAP. Dad and RN are at bedside. RN reports that PICC was removed over the weekend with plan for line holiday. Patient was previously receiving home TPN and was on TPN during the time of last RD assessment (06/17/20).   SLP saw patient yesterday evening; note reviewed in detail. Patient cleared for CLD from the hospital (but no jello) and for family to bring in blenderized foods that are nectar-thick consistency. SLP reported that purees are not advised for this patient. Patient and dad reported that milky items lead to increased secretions which are difficult for patient to clear.   Dad reports that mom prepares blenderized items and that they are usually a mix of beans, eggs, and rice. Patient has accepted both cartons of  Boost Breeze offered.   Encouraged increasing food variety in blenderized items. Recommended: olive oil and/or avocado, canned or very soft fruits, sweet potatoes.   Weight yesterday was 90 lb which is up from weights throughout 2021. The highest weight recorded in the chart since 01/2018 was on 09/16/19 when he weighed 93 lb.  Assessment of muscle mass/muscle depletions would not be an appropriate indicator of malnutrition for this patient given medical hx of muscular dystrophy.   Per notes: - sepsis d/t bacteremia - need for PICC vs tunneled line once cultures are clear - bronchopneumonia - tiny L pneumothorax - acute metabolic encephalopathy--now resolved - severe hypokalemia--now resolved - R pleural effusion - possible discussion outpatient about PEG/G-tube placement   Labs reviewed; CBGs: 89-109 mg/dl, Na: 133 mmol/l, BUN: <5 mg/dl, creatinine: <0.3 mg/dl, Ca: 8.3 mg/dl, Alk Phos elevated, LFTs elevated, vitamin B12 very elevated. Medications reviewed; 400 mg mag-ox TID. IVF; D10-1/2 NS-40 mEq KCl @ 50 ml/hr (408 kcal).     NUTRITION - FOCUSED PHYSICAL EXAM:  did not complete due to patient with muscle-wasting condition (muscular dystrophy)  Diet Order:   Diet Order            Diet clear liquid Room service appropriate? Yes; Fluid consistency: Thin  Diet effective now                 EDUCATION NEEDS:   Education needs have been addressed  Skin:  Skin Assessment: Reviewed RN Assessment  Last BM:  11/16  Height:   Ht Readings from Last 1 Encounters:  08/22/20 '5\' 2"'  (1.575 m)    Weight:  Wt Readings from Last 1 Encounters:  08/22/20 40.8 kg    Estimated Nutritional Needs:  Kcal:  1425-1630 kcal Protein:  75-90 grams Fluid:  >/= 1.6 L/day     Jarome Matin, MS, RD, LDN, CNSC Inpatient Clinical Dietitian RD pager # available in [facility]  After hours/weekend pager # available in [facility]

## 2020-08-23 NOTE — Progress Notes (Signed)
PROGRESS NOTE  Peter Hayes  HGD:[id number] DOB: 06/04/1996 DOA: 08/21/2020 PCP: Penni Bombard, PA   Brief Narrative: Peter Hayes is a 24 y.o. 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 He had been in the ED 11/13 for a fever of 102F at home and abdominal discomfort with mild distension with nausea and tachycardia in the ED but missed his home metoprolol. His vitals improved with IV fluids and antipyretic. CXR yesterday showed possible bronchopneumonia and blood cultures were taken from his PICC line. He was discharged with doxycycline with strict outpatient follow up. Today, his father, who is his primary caretaker, noticed that he appeared confused and not responding normally and checked his blood glucose which was 59 and called EMS. He was given dextrose by EMS with improvement. On further chart review per ED provider today, the patient was found to have Staph aureus positive blood cultures from yesterday via PICC line and was started on Ancef 2g IV per pharmacy recommendations.   ED Course: Hypothermic (T 96.42F), tachycardic, tachypnic, normotensive on home BiPAP. Notable Labs: Na 136, K 2.1, Glucose 160, BUN 22, Cr 0.3, Mg 1.8, Alk phos 292, AST 1897, ALT 1573, T bili 3.0, Lactic acid 1.3, WBC 7.6, Hb 13.0, Platelets 95, INR 1.7. Notable Imaging: CXR with Left apical pneumothorax without consolidation or edema. CT chest/abd/pelv W contrast: small left sided pneumothorax, small right pleural effusion with mild nodular ground glass opacity in the inferior right lower lobe (infectious vs. Inflammatory alveolitis), diffuse gaseous distension of small bowel and colon with colostomy without colonic transition zone. Patient received 1.5 L LR bolus, Ancef and potassium IV. ID was consulted for Staph  bacteremia.  Assessment & Plan: Active Problems:   Muscular dystrophy ([place])   Hypoglycemia without diagnosis of diabetes mellitus   Sepsis ([place])   Bacteremia   Abnormal LFTs   Acute metabolic encephalopathy   Pneumothorax on left  Sepsis due to MSSA bacteremia: +Cultures from 11/13 from PICC.  - Repeat blood cultures 11/14 are positive. Repeat blood cultures 11/16.  - Discontinued PICC line. Will need PICC replaced vs. tunneled line (will be difficult to replace) for TPN once cultures cleared.  - Appreciate ID recommendations. Continue ancef. - TTE showed presence of PICC into right atrium also seen on CT. No vegetations noted, normal function overall (limited by habitus). Suspect tenuous respiratory status precludes TEE.  Bronchopneumonia: mild nodular GGO in inferior RLL.   - Cover for aspiration due to his high risk with unasyn - Sputum culture requested if able to expectorate  Left PTX: Also has history of pneumomediastinum complicating chronic NIPPV use.  - Repeat CXR this AM personally reviewed showing stability of tiny pneumothorax. Will continue serial CXR in AM since he's on NIPPV.  Acute metabolic encephalopathy: Most consistent with hypoglycemia-induced AMS which has resolved.   Hypoglycemia: Related both to malnutrition and diminished stores for glycogenolysis and gluconeogenesis.  - Will need to continue dextrose infusion, checking CBGs q2h with additional dextrose to be given prn. Can also allow diet per SLP.  Hypokalemia: Severe, improved.  - Continue supplementation in IVF.   Chronic hypoxic and hypercarbic respiratory failure:  - BiPAP-dependent with bleed-in 2L O2. Stable.   Right pleural effusion: Has history of this in the past. Previous thoracentesis not suggestive of infection at that time.  - Repeat CXR  shows stability.  LFT elevations: Unclear precipitant. Improving. RUS U/S limited but unremarkable. CT abd showed no significant hepatic or biliary  lesions/obstructions.   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 Has been on TPN since that time. CT abd stable. - Simethicone  Severe protein-calorie malnutrition: Pt's family has opted for chronic PICC/TPN in lieu of gastrostomy. - Continue TPN once line back in. Discussed with patient and father the concept of G tube placement to which they are more amenable than notes indicate they have been in the past. I recommend they follow up with GI/general surgery as outpatient to discuss risks/benefits as this would not likely be placed during bacteremia.  Duchenne's muscular dystrophy: Noted - Needs to continue outpatient neurology follow up. - Continue scopolamine patch.   Sinus tachycardia: Chronic.  - Continue metoprolol  Hypotension: Appears chronic, has been on midodrine 53m in the past per PCP notes. Lactic acid not elevated.  - Continue monitoring closely in progressive care unit.   Pancytopenia:  - Recheck CBC in AM. Stable to improving.  Anemia, normocytic: Suspect to be due to malnutrition, chronic disease, possibly iron deficiency.  - Anemia panel - Note pt is Jehovah's Witness.  DVT prophylaxis: Lovenox Code Status: Full Family Communication: Father at bedside Disposition Plan:  Status is: Inpatient  Remains inpatient appropriate because:Hemodynamically unstable, Persistent severe electrolyte disturbances, Altered mental status, Ongoing diagnostic testing needed not appropriate for outpatient work up and IV treatments appropriate due to intensity of illness or inability to take PO  Dispo: The patient is from: Home              Anticipated d/c is to: Home              Anticipated d/c date is: > 3 days - requires clearance of bacteremia.              Patient currently is not medically stable to d/c.  Consultants:   ID  PCCM phone consultation  Procedures:   PICC removal  08/21/2020  Antimicrobials:  Ancef  Unasyn   Subjective: Lower back/bottom hurts, is not turning. No chest pain or dyspnea or fevers. Father notes he's at his mental baseline.  Objective: Vitals:   08/23/20 0606 08/23/20 0732 08/23/20 0820 08/23/20 1231  BP: (!) 92/55  97/68 103/62  Pulse: 82  87 95  Resp: (!) '21  20 15  ' Temp:   98.1 F (36.7 C) (!) 97.5 F (36.4 C)  TempSrc:   Axillary Axillary  SpO2: 98% 100% 100% 99%  Weight:      Height:        Intake/Output Summary (Last 24 hours) at 08/23/2020 1415 Last data filed at 08/23/2020 1000 Gross per 24 hour  Intake 1649.01 ml  Output 1000 ml  Net 649.01 ml   Filed Weights   08/21/20 0631 08/22/20 0500  Weight: 40.8 kg 40.8 kg   Gen: 24y.o. male in no distress Pulm: Nonlabored on BiPAP. Clear, diminished at bases. CV: Regular rate and rhythm. No murmur, rub, or gallop. No JVD, no dependent edema. GI: Abdomen soft, non-tender, non-distended, with normoactive bowel sounds. Colostomy with frequent gas.  Ext: Warm, stable contractures Skin: No new rashes, lesions or ulcers on visualized skin. Neuro: Alert and oriented. Stable neurological deficits. Psych: Judgement and insight appear fair. Mood euthymic & affect congruent. Behavior is appropriate.    Data Reviewed: I have personally reviewed following labs and imaging studies  CBC: Recent Labs  Lab 08/20/20 1616 08/21/20 0814 08/22/20 0413 08/23/20 0424  WBC 7.2 7.6 2.8* 3.2*  NEUTROABS 6.3 7.3  --  1.4*  HGB 12.6* 13.0 10.8* 10.8*  HCT 39.6 41.0 33.6* 33.3*  MCV 82.8 84.7 82.6 82.8  PLT 105* 95* 72* 87*   Basic Metabolic Panel: Recent Labs  Lab 08/21/20 0814 08/21/20 0922 08/21/20 1305 08/22/20 0413 08/22/20 1627 08/23/20 0424  NA 136  --  134* 135 132* 133*  K 2.1*  --  2.8* 2.2* 4.2 3.8  CL 101  --  102 101 99 100  CO2 27  --  '25 25 26 26  ' GLUCOSE 160*  --  69* 109* 132* 101*  BUN 22*  --  '13 12 7 ' <5*  CREATININE <0.30*  --  <0.30* <0.30*  <0.30* <0.30*  CALCIUM 8.8*  --  8.4* 8.3* 8.3* 8.3*  MG  --  1.8  --   --   --   --    GFR: CrCl cannot be calculated (This lab value cannot be used to calculate CrCl because it is not a number: <0.30). Liver Function Tests: Recent Labs  Lab 08/20/20 1616 08/21/20 0814 08/22/20 0413 08/23/20 0424  AST 110* 1,897* 621* 233*  ALT 173* 1,573* 802* 506*  ALKPHOS 163* 292* 224* 190*  BILITOT 1.2 3.0* 1.3* 0.8  PROT 8.5* 8.5* 6.0* 6.5  ALBUMIN 4.4 4.2 3.1* 3.3*   Recent Labs  Lab 08/20/20 1616 08/21/20 0922  LIPASE 27 21   No results for input(s): AMMONIA in the last 168 hours. Coagulation Profile: Recent Labs  Lab 08/21/20 0814 08/22/20 0413 08/23/20 0424  INR 1.7* 2.2* 1.1   Cardiac Enzymes: No results for input(s): CKTOTAL, CKMB, CKMBINDEX, TROPONINI in the last 168 hours. BNP (last 3 results) No results for input(s): PROBNP in the last 8760 hours. HbA1C: No results for input(s): HGBA1C in the last 72 hours. CBG: Recent Labs  Lab 08/23/20 0404 08/23/20 0604 08/23/20 0812 08/23/20 1035 08/23/20 1228  GLUCAP 89 98 101* 99 109*   Lipid Profile: No results for input(s): CHOL, HDL, LDLCALC, TRIG, CHOLHDL, LDLDIRECT in the last 72 hours. Thyroid Function Tests: No results for input(s): TSH, T4TOTAL, FREET4, T3FREE, THYROIDAB in the last 72 hours. Anemia Panel: Recent Labs    08/23/20 0424  VITAMINB12 2,030*  FOLATE 21.5  FERRITIN 341*  TIBC 247*  IRON 19*  RETICCTPCT 0.6   Urine analysis:    Component Value Date/Time   COLORURINE AMBER (A) 08/21/2020 0814   APPEARANCEUR HAZY (A) 08/21/2020 0814   LABSPEC 1.015 08/21/2020 0814   PHURINE 5.0 08/21/2020 0814   GLUCOSEU NEGATIVE 08/21/2020 0814   HGBUR NEGATIVE 08/21/2020 0814   BILIRUBINUR NEGATIVE 08/21/2020 0814   KETONESUR 20 (A) 08/21/2020 0814   PROTEINUR 100 (A) 08/21/2020 0814   NITRITE NEGATIVE 08/21/2020 0814   LEUKOCYTESUR NEGATIVE 08/21/2020 0814   Recent Results (from the past 240  hour(s))  Culture, blood (routine x 2)     Status: Abnormal   Collection Time: 08/20/20  4:10 PM   Specimen: BLOOD LEFT ARM  Result Value Ref Range Status   Specimen Description   Final    BLOOD LEFT ARM UPPER PICC Performed at [facility], [address] [place], [place] [address]    Special Requests   Final    BOTTLES DRAWN AEROBIC AND ANAEROBIC Blood Culture adequate volume Performed at [facility], [address] [place], [place] [address]  Culture  Setup Time   Final    GRAM POSITIVE COCCI IN BOTH AEROBIC AND ANAEROBIC BOTTLES Organism ID to follow CRITICAL RESULT CALLED TO, READ BACK BY AND VERIFIED WITH: PHRMD M LILLISON '@[id number]'  08/21/20 BY S GEZAHEGN Performed at [facility], [address], [place], [place] [address]    Culture STAPHYLOCOCCUS AUREUS (A)  Final   Report Status 08/23/2020 FINAL  Final   Organism ID, Bacteria STAPHYLOCOCCUS AUREUS  Final      Susceptibility   Staphylococcus aureus - MIC*    CIPROFLOXACIN <=0.5 SENSITIVE Sensitive     ERYTHROMYCIN <=0.25 SENSITIVE Sensitive     GENTAMICIN <=0.5 SENSITIVE Sensitive     OXACILLIN 0.5 SENSITIVE Sensitive     TETRACYCLINE <=1 SENSITIVE Sensitive     VANCOMYCIN 1 SENSITIVE Sensitive     TRIMETH/SULFA <=10 SENSITIVE Sensitive     CLINDAMYCIN <=0.25 SENSITIVE Sensitive     RIFAMPIN <=0.5 SENSITIVE Sensitive     Inducible Clindamycin NEGATIVE Sensitive     * STAPHYLOCOCCUS AUREUS  Blood Culture ID Panel (Reflexed)     Status: Abnormal   Collection Time: 08/20/20  4:10 PM  Result Value Ref Range Status   Enterococcus faecalis NOT DETECTED NOT DETECTED Final   Enterococcus Faecium NOT DETECTED NOT DETECTED Final   Listeria monocytogenes NOT DETECTED NOT DETECTED Final   Staphylococcus species DETECTED (A) NOT DETECTED Final    Comment: CRITICAL RESULT CALLED TO, READ BACK BY AND VERIFIED WITH: PHRMD M LILLISON '@[id number]'  08/21/20 BY S GEZAHEGN    Staphylococcus aureus (BCID)  DETECTED (A) NOT DETECTED Final    Comment: CRITICAL RESULT CALLED TO, READ BACK BY AND VERIFIED WITH: PHRMD M LILLISON '@[id number]'  08/21/20 BY S GEZAHEGN    Staphylococcus epidermidis NOT DETECTED NOT DETECTED Final   Staphylococcus lugdunensis NOT DETECTED NOT DETECTED Final   Streptococcus species NOT DETECTED NOT DETECTED Final   Streptococcus agalactiae NOT DETECTED NOT DETECTED Final   Streptococcus pneumoniae NOT DETECTED NOT DETECTED Final   Streptococcus pyogenes NOT DETECTED NOT DETECTED Final   A.calcoaceticus-baumannii NOT DETECTED NOT DETECTED Final   Bacteroides fragilis NOT DETECTED NOT DETECTED Final   Enterobacterales NOT DETECTED NOT DETECTED Final   Enterobacter cloacae complex NOT DETECTED NOT DETECTED Final   Escherichia coli NOT DETECTED NOT DETECTED Final   Klebsiella aerogenes NOT DETECTED NOT DETECTED Final   Klebsiella oxytoca NOT DETECTED NOT DETECTED Final   Klebsiella pneumoniae NOT DETECTED NOT DETECTED Final   Proteus species NOT DETECTED NOT DETECTED Final   Salmonella species NOT DETECTED NOT DETECTED Final   Serratia marcescens NOT DETECTED NOT DETECTED Final   Haemophilus influenzae NOT DETECTED NOT DETECTED Final   Neisseria meningitidis NOT DETECTED NOT DETECTED Final   Pseudomonas aeruginosa NOT DETECTED NOT DETECTED Final   Stenotrophomonas maltophilia NOT DETECTED NOT DETECTED Final   Candida albicans NOT DETECTED NOT DETECTED Final   Candida auris NOT DETECTED NOT DETECTED Final   Candida glabrata NOT DETECTED NOT DETECTED Final   Candida krusei NOT DETECTED NOT DETECTED Final   Candida parapsilosis NOT DETECTED NOT DETECTED Final   Candida tropicalis NOT DETECTED NOT DETECTED Final   Cryptococcus neoformans/gattii NOT DETECTED NOT DETECTED Final   Meth resistant mecA/C and MREJ NOT DETECTED NOT DETECTED Final    Comment: Performed at [facility], [address] [place], [place] [address]  Respiratory Panel by RT PCR (Flu A&B, Covid) -  Nasopharyngeal Swab     Status: None   Collection Time:  08/20/20  4:16 PM   Specimen: Nasopharyngeal Swab  Result Value Ref Range Status   SARS Coronavirus 2 by RT PCR NEGATIVE NEGATIVE Final    Comment: (NOTE) SARS-CoV-2 target nucleic acids are NOT DETECTED.  The SARS-CoV-2 RNA is generally detectable in upper respiratoy specimens during the acute phase of infection. The lowest concentration of SARS-CoV-2 viral copies this assay can detect is 131 copies/mL. A negative result does not preclude SARS-Cov-2 infection and should not be used as the sole basis for treatment or other patient management decisions. A negative result may occur with  improper specimen collection/handling, submission of specimen other than nasopharyngeal swab, presence of viral mutation(s) within the areas targeted by this assay, and inadequate number of viral copies (<131 copies/mL). A negative result must be combined with clinical observations, patient history, and epidemiological information. The expected result is Negative.  Fact Sheet for Patients:  PinkCheek.be  Fact Sheet for Healthcare Providers:  GravelBags.it  This test is no t yet approved or cleared by the [place] FDA and  has been authorized for detection and/or diagnosis of SARS-CoV-2 by FDA under an Emergency Use Authorization (EUA). This EUA will remain  in effect (meaning this test can be used) for the duration of the COVID-19 declaration under Section 564(b)(1) of the Act, 21 U.S.C. section 360bbb-3(b)(1), unless the authorization is terminated or revoked sooner.     Influenza A by PCR NEGATIVE NEGATIVE Final   Influenza B by PCR NEGATIVE NEGATIVE Final    Comment: (NOTE) The Xpert Xpress SARS-CoV-2/FLU/RSV assay is intended as an aid in  the diagnosis of influenza from Nasopharyngeal swab specimens and  should not be used as a sole basis for treatment. Nasal washings and   aspirates are unacceptable for Xpert Xpress SARS-CoV-2/FLU/RSV  testing.  Fact Sheet for Patients: PinkCheek.be  Fact Sheet for Healthcare Providers: GravelBags.it  This test is not yet approved or cleared by the [place] FDA and  has been authorized for detection and/or diagnosis of SARS-CoV-2 by  FDA under an Emergency Use Authorization (EUA). This EUA will remain  in effect (meaning this test can be used) for the duration of the  Covid-19 declaration under Section 564(b)(1) of the Act, 21  U.S.C. section 360bbb-3(b)(1), unless the authorization is  terminated or revoked. Performed at [facility], [address] [place], [place] [address]   Urine culture     Status: Abnormal   Collection Time: 08/20/20  7:36 PM   Specimen: Urine, Random  Result Value Ref Range Status   Specimen Description   Final    URINE, RANDOM Performed at [facility], [address] [place], [place] [address]    Special Requests   Final    NONE Performed at [facility], [address] [place], [place] [address]    Culture (A)  Final    60,000 COLONIES/mL ESCHERICHIA COLI 30,000 COLONIES/mL ENTEROCOCCUS FAECALIS    Report Status 08/23/2020 FINAL  Final   Organism ID, Bacteria ESCHERICHIA COLI (A)  Final   Organism ID, Bacteria ENTEROCOCCUS FAECALIS (A)  Final      Susceptibility   Escherichia coli - MIC*    AMPICILLIN >=32 RESISTANT Resistant     CEFAZOLIN <=4 SENSITIVE Sensitive     CEFEPIME <=0.12 SENSITIVE Sensitive     CEFTRIAXONE <=0.25 SENSITIVE Sensitive     CIPROFLOXACIN >=4 RESISTANT Resistant     GENTAMICIN <=1 SENSITIVE Sensitive     IMIPENEM <=0.25  SENSITIVE Sensitive     NITROFURANTOIN <=16 SENSITIVE Sensitive     TRIMETH/SULFA <=20 SENSITIVE Sensitive     AMPICILLIN/SULBACTAM >=32 RESISTANT Resistant     PIP/TAZO <=4 SENSITIVE Sensitive     * 60,000 COLONIES/mL ESCHERICHIA COLI    Enterococcus faecalis - MIC*    AMPICILLIN <=2 SENSITIVE Sensitive     NITROFURANTOIN <=16 SENSITIVE Sensitive     VANCOMYCIN 1 SENSITIVE Sensitive     * 30,000 COLONIES/mL ENTEROCOCCUS FAECALIS  Urine culture     Status: Abnormal   Collection Time: 08/21/20  8:14 AM   Specimen: In/Out Cath Urine  Result Value Ref Range Status   Specimen Description   Final    IN/OUT CATH URINE Performed at [facility] [address], [place], [place] [address]    Special Requests   Final    URINE, RANDOM Performed at [facility] [address] [place], [place] [address]    Culture (A)  Final    20,000 COLONIES/mL ESCHERICHIA COLI 20,000 COLONIES/mL ENTEROCOCCUS FAECALIS    Report Status 08/23/2020 FINAL  Final   Organism ID, Bacteria ESCHERICHIA COLI (A)  Final   Organism ID, Bacteria ENTEROCOCCUS FAECALIS (A)  Final      Susceptibility   Escherichia coli - MIC*    AMPICILLIN >=32 RESISTANT Resistant     CEFAZOLIN 16 SENSITIVE Sensitive     CEFEPIME <=0.12 SENSITIVE Sensitive     CEFTRIAXONE <=0.25 SENSITIVE Sensitive     CIPROFLOXACIN >=4 RESISTANT Resistant     GENTAMICIN <=1 SENSITIVE Sensitive     IMIPENEM <=0.25 SENSITIVE Sensitive     NITROFURANTOIN <=16 SENSITIVE Sensitive     TRIMETH/SULFA <=20 SENSITIVE Sensitive     AMPICILLIN/SULBACTAM >=32 RESISTANT Resistant     PIP/TAZO <=4 SENSITIVE Sensitive     * 20,000 COLONIES/mL ESCHERICHIA COLI   Enterococcus faecalis - MIC*    AMPICILLIN <=2 SENSITIVE Sensitive     NITROFURANTOIN <=16 SENSITIVE Sensitive     VANCOMYCIN 1 SENSITIVE Sensitive     * 20,000 COLONIES/mL ENTEROCOCCUS FAECALIS  Blood culture (routine x 2)     Status: Abnormal (Preliminary result)   Collection Time: 08/21/20  8:14 AM   Specimen: BLOOD RIGHT ARM  Result Value Ref Range Status   Specimen Description   Final    BLOOD RIGHT ARM Performed at [facility], [address], [place], [place] [address]     Special Requests   Final    BOTTLES DRAWN AEROBIC ONLY Blood Culture results may not be optimal due to an inadequate volume of blood received in culture bottles Performed at [facility] [address], [place], [place] [address]    Culture  Setup Time   Final    GRAM POSITIVE COCCI IN CLUSTERS AEROBIC BOTTLE ONLY Organism ID to follow CRITICAL RESULT CALLED TO, READ BACK BY AND VERIFIED WITH: Barth Kirks [facility] 4196 08/22/20 A BROWNING Performed at [facility], [address] [place], [place] [address]    Culture STAPHYLOCOCCUS AUREUS (A)  Final   Report Status PENDING  Incomplete  Blood Culture ID Panel (Reflexed)     Status: Abnormal   Collection Time: 08/21/20  8:14 AM  Result Value Ref Range Status   Enterococcus faecalis NOT DETECTED NOT DETECTED Final   Enterococcus Faecium NOT DETECTED NOT DETECTED Final   Listeria monocytogenes NOT DETECTED NOT DETECTED Final   Staphylococcus species DETECTED (A) NOT DETECTED Final    Comment:  CRITICAL RESULT CALLED TO, READ BACK BY AND VERIFIED WITH: Barth Kirks PHARMD [id number] 08/22/20 A BROWNING    Staphylococcus aureus (BCID) DETECTED (A) NOT DETECTED Final    Comment: CRITICAL RESULT CALLED TO, READ BACK BY AND VERIFIED WITH: Barth Kirks PHARMD [id number] 08/22/20 A BROWNING    Staphylococcus epidermidis NOT DETECTED NOT DETECTED Final   Staphylococcus lugdunensis NOT DETECTED NOT DETECTED Final   Streptococcus species NOT DETECTED NOT DETECTED Final   Streptococcus agalactiae NOT DETECTED NOT DETECTED Final   Streptococcus pneumoniae NOT DETECTED NOT DETECTED Final   Streptococcus pyogenes NOT DETECTED NOT DETECTED Final   A.calcoaceticus-baumannii NOT DETECTED NOT DETECTED Final   Bacteroides fragilis NOT DETECTED NOT DETECTED Final   Enterobacterales NOT DETECTED NOT DETECTED Final   Enterobacter cloacae complex NOT DETECTED NOT DETECTED Final   Escherichia coli NOT DETECTED NOT DETECTED Final   Klebsiella aerogenes  NOT DETECTED NOT DETECTED Final   Klebsiella oxytoca NOT DETECTED NOT DETECTED Final   Klebsiella pneumoniae NOT DETECTED NOT DETECTED Final   Proteus species NOT DETECTED NOT DETECTED Final   Salmonella species NOT DETECTED NOT DETECTED Final   Serratia marcescens NOT DETECTED NOT DETECTED Final   Haemophilus influenzae NOT DETECTED NOT DETECTED Final   Neisseria meningitidis NOT DETECTED NOT DETECTED Final   Pseudomonas aeruginosa NOT DETECTED NOT DETECTED Final   Stenotrophomonas maltophilia NOT DETECTED NOT DETECTED Final   Candida albicans NOT DETECTED NOT DETECTED Final   Candida auris NOT DETECTED NOT DETECTED Final   Candida glabrata NOT DETECTED NOT DETECTED Final   Candida krusei NOT DETECTED NOT DETECTED Final   Candida parapsilosis NOT DETECTED NOT DETECTED Final   Candida tropicalis NOT DETECTED NOT DETECTED Final   Cryptococcus neoformans/gattii NOT DETECTED NOT DETECTED Final   Meth resistant mecA/C and MREJ NOT DETECTED NOT DETECTED Final    Comment: Performed at [facility], [address], [place], [place] [address]  Respiratory Panel by RT PCR (Flu A&B, Covid) - Nasopharyngeal Swab     Status: None   Collection Time: 08/21/20  8:27 AM   Specimen: Nasopharyngeal Swab  Result Value Ref Range Status   SARS Coronavirus 2 by RT PCR NEGATIVE NEGATIVE Final    Comment: (NOTE) SARS-CoV-2 target nucleic acids are NOT DETECTED.  The SARS-CoV-2 RNA is generally detectable in upper respiratoy specimens during the acute phase of infection. The lowest concentration of SARS-CoV-2 viral copies this assay can detect is 131 copies/mL. A negative result does not preclude SARS-Cov-2 infection and should not be used as the sole basis for treatment or other patient management decisions. A negative result may occur with  improper specimen collection/handling, submission of specimen other than nasopharyngeal swab, presence of viral mutation(s) within the areas targeted by this  assay, and inadequate number of viral copies (<131 copies/mL). A negative result must be combined with clinical observations, patient history, and epidemiological information. The expected result is Negative.  Fact Sheet for Patients:  PinkCheek.be  Fact Sheet for Healthcare Providers:  GravelBags.it  This test is no t yet approved or cleared by the [place] FDA and  has been authorized for detection and/or diagnosis of SARS-CoV-2 by FDA under an Emergency Use Authorization (EUA). This EUA will remain  in effect (meaning this test can be used) for the duration of the COVID-19 declaration under Section 564(b)(1) of the Act, 21 U.S.C. section 360bbb-3(b)(1), unless the authorization is terminated or revoked sooner.     Influenza A by PCR NEGATIVE NEGATIVE Final  Influenza B by PCR NEGATIVE NEGATIVE Final    Comment: (NOTE) The Xpert Xpress SARS-CoV-2/FLU/RSV assay is intended as an aid in  the diagnosis of influenza from Nasopharyngeal swab specimens and  should not be used as a sole basis for treatment. Nasal washings and  aspirates are unacceptable for Xpert Xpress SARS-CoV-2/FLU/RSV  testing.  Fact Sheet for Patients: PinkCheek.be  Fact Sheet for Healthcare Providers: GravelBags.it  This test is not yet approved or cleared by the [place] FDA and  has been authorized for detection and/or diagnosis of SARS-CoV-2 by  FDA under an Emergency Use Authorization (EUA). This EUA will remain  in effect (meaning this test can be used) for the duration of the  Covid-19 declaration under Section 564(b)(1) of the Act, 21  U.S.C. section 360bbb-3(b)(1), unless the authorization is  terminated or revoked. Performed at [facility], [place] [address], [place], [place] [address]   Culture, blood (routine x 2)     Status: None (Preliminary result)    Collection Time: 08/23/20  7:20 AM   Specimen: BLOOD RIGHT HAND  Result Value Ref Range Status   Specimen Description   Final    BLOOD RIGHT HAND Performed at [facility] [address], [place], [place] [address]    Special Requests   Final    BOTTLES DRAWN AEROBIC ONLY Blood Culture adequate volume Performed at [facility] [address], [place], [place] [address]    Culture   Final    NO GROWTH < 12 HOURS Performed at [facility] [address] [place], [place] [address]    Report Status PENDING  Incomplete  Culture, blood (routine x 2)     Status: None (Preliminary result)   Collection Time: 08/23/20  7:36 AM   Specimen: BLOOD RIGHT HAND  Result Value Ref Range Status   Specimen Description   Final    BLOOD RIGHT HAND Performed at [facility] [address], [place], [place] [address]    Special Requests   Final    BOTTLES DRAWN AEROBIC ONLY Blood Culture adequate volume Performed at [facility] [address] [place], [place] [address]    Culture   Final    NO GROWTH < 12 HOURS Performed at [facility] [address] [place], [place] [address]    Report Status PENDING  Incomplete      Radiology Studies: DG CHEST PORT 1 VIEW  Result Date: 08/23/2020 CLINICAL DATA:  Pneumothorax. EXAM: PORTABLE CHEST 1 VIEW COMPARISON:  Chest x-ray 08/22/2020.  CT chest 08/21/2020. FINDINGS: Mediastinum and hilar structures are normal. Heart size normal. Persistent atelectatic changes right mid lung. Persistent right pleural effusion. Tiny left pleural effusion cannot be excluded. Persistent small left apical pneumothorax. No acute bony abnormality identified. IMPRESSION: 1. Persistent small left apical pneumothorax.  No interim change. 2. Persistent atelectatic changes right mid lung. Persistent right pleural effusion. Tiny left pleural effusion cannot be excluded. Electronically Signed    By: Marcello Moores  Register   On: 08/23/2020 06:57   DG CHEST PORT 1 VIEW  Result Date: 08/22/2020 CLINICAL DATA:  Pneumothorax EXAM: PORTABLE CHEST 1 VIEW COMPARISON:  CT from yesterday FINDINGS: Hazy opacity over the right upper chest correlating with pleural fluid on CT yesterday. Small left apical pneumothorax, 14 mm craniocaudal, unchanged. Lung volumes remain low. Normal heart size. IMPRESSION: 1. Unchanged small left apical pneumothorax. 2. Unchanged hazy right chest opacification correlating with pleural  fluid by CT. Electronically Signed   By: Monte Fantasia M.D.   On: 08/22/2020 04:40   ECHOCARDIOGRAM COMPLETE  Result Date: 08/21/2020    ECHOCARDIOGRAM REPORT   Patient Name:   NELL SCHRACK Date of Exam: 08/21/2020 Medical Rec #:  [id number]  Height:       62.0 in Accession #:    [id number] Weight:       90.0 lb Date of Birth:  Dec 09, 1995  BSA:          1.362 m Patient Age:    24 years   BP:           98/56 mmHg Patient Gender: M          HR:           95 bpm. Exam Location:  Inpatient Procedure: 2D Echo, Cardiac Doppler and Color Doppler                         STAT ECHO Reported to: Dr. Kirk Ruths on 08/21/2020 3:18:00 PM. Indications:    Bacteremia 790.7 / R78.81  History:        Patient has prior history of Echocardiogram examinations, most                 recent 06/17/2020. Sepsis. Hypothyroidism.  Sonographer:    Tiffany Dance Referring Phys: [id number] Harold Hedge  Sonographer Comments: No subcostal window. Image acquisition challenging due to patient body habitus. IMPRESSIONS  1. Left ventricular ejection fraction, by estimation, is 60 to 65%. The left ventricle has normal function. The left ventricle has no regional wall motion abnormalities. Left ventricular diastolic parameters were normal.  2. Right ventricular systolic function is normal. The right ventricular size is normal. There is normal pulmonary artery systolic pressure. The estimated right ventricular systolic pressure is 35.5 mmHg.   3. There is a linear echodensity in the right atrium prolapsing across the tricuspid valve that is likely an intravenous catheter, not a vegetation. It does not appear to be attached to the tricuspid valve, but probably arises from the superior vena cava.  4. The mitral valve is normal in structure. No evidence of mitral valve regurgitation. No evidence of mitral stenosis.  5. The aortic valve is normal in structure. Aortic valve regurgitation is not visualized. No aortic stenosis is present.  6. The inferior vena cava is normal in size with greater than 50% respiratory variability, suggesting right atrial pressure of 3 mmHg. Conclusion(s)/Recommendation(s): No evidence of valvular vegetations on this transthoracic echocardiogram. Would recommend a transesophageal echocardiogram to exclude infective endocarditis if clinically indicated. FINDINGS  Left Ventricle: Left ventricular ejection fraction, by estimation, is 60 to 65%. The left ventricle has normal function. The left ventricle has no regional wall motion abnormalities. The left ventricular internal cavity size was normal in size. There is  no left ventricular hypertrophy. Left ventricular diastolic parameters were normal. Normal left ventricular filling pressure. Right Ventricle: The right ventricular size is normal. No increase in right ventricular wall thickness. Right ventricular systolic function is normal. There is normal pulmonary artery systolic pressure. The tricuspid regurgitant velocity is 2.11 m/s, and  with an assumed right atrial pressure of 8 mmHg, the estimated right ventricular systolic pressure is 73.2 mmHg. Left Atrium: Left atrial size was normal in size. Right Atrium: There is a linear echodensity in the right atrium prolapsing across the tricuspid valve that is likely an intravenous catheter, not a vegetation. It does not appear  to be attached to the tricuspid valve, but probably arises from the superior vena cava. Right atrial size was  normal in size. Pericardium: There is no evidence of pericardial effusion. Mitral Valve: The mitral valve is normal in structure. No evidence of mitral valve regurgitation. No evidence of mitral valve stenosis. Tricuspid Valve: The tricuspid valve is normal in structure. Tricuspid valve regurgitation is mild . No evidence of tricuspid stenosis. Aortic Valve: The aortic valve is normal in structure. Aortic valve regurgitation is not visualized. No aortic stenosis is present. Pulmonic Valve: The pulmonic valve was normal in structure. Pulmonic valve regurgitation is not visualized. No evidence of pulmonic stenosis. Aorta: The aortic root is normal in size and structure. Venous: The inferior vena cava is normal in size with greater than 50% respiratory variability, suggesting right atrial pressure of 3 mmHg. IAS/Shunts: No atrial level shunt detected by color flow Doppler.  LEFT VENTRICLE PLAX 2D LVIDd:         3.70 cm  Diastology LVIDs:         2.70 cm  LV e' medial:    8.81 cm/s LV PW:         0.80 cm  LV E/e' medial:  10.1 LV IVS:        0.70 cm  LV e' lateral:   9.90 cm/s LVOT diam:     1.70 cm  LV E/e' lateral: 8.9 LV SV:         28 LV SV Index:   20 LVOT Area:     2.27 cm  RIGHT VENTRICLE RV Basal diam:  2.60 cm RV S prime:     9.14 cm/s TAPSE (M-mode): 1.0 cm LEFT ATRIUM           Index       RIGHT ATRIUM          Index LA diam:      3.10 cm 2.28 cm/m  RA Area:     6.39 cm LA Vol (A2C): 22.0 ml 16.16 ml/m RA Volume:   11.20 ml 8.23 ml/m LA Vol (A4C): 22.7 ml 16.67 ml/m  AORTIC VALVE LVOT Vmax:   70.10 cm/s LVOT Vmean:  49.000 cm/s LVOT VTI:    0.122 m  AORTA Ao Root diam: 2.30 cm Ao Asc diam:  2.00 cm MITRAL VALVE               TRICUSPID VALVE MV Area (PHT): 3.31 cm    TR Peak grad:   17.8 mmHg MV Decel Time: 229 msec    TR Vmax:        211.00 cm/s MV E velocity: 88.60 cm/s MV A velocity: 83.90 cm/s  SHUNTS MV E/A ratio:  1.06        Systemic VTI:  0.12 m                            Systemic Diam: 1.70 cm  Mihai Croitoru MD Electronically signed by Sanda Klein MD Signature Date/Time: 08/21/2020/3:29:58 PM    Final     Scheduled Meds: . chlorhexidine  15 mL Mouth Rinse BID  . enoxaparin (LOVENOX) injection  30 mg Subcutaneous Q24H  . feeding supplement  1 Container Oral TID BM  . magnesium oxide  400 mg Oral TID  . mouth rinse  15 mL Mouth Rinse q12n4p  . metoprolol tartrate  25 mg Oral Q8H  . scopolamine  1 patch Transdermal Q72H  . simethicone  40 mg Oral QID  . sodium chloride flush  3 mL Intravenous Q12H   Continuous Infusions: .  ceFAZolin (ANCEF) IV 2 g (08/23/20 6605)  . dextrose 10 % and 0.45 % NaCl 1,000 mL with potassium chloride 40 mEq infusion 50 mL/hr at 08/22/20 1759     LOS: 2 days   Time spent: 35 minutes.  Patrecia Pour, MD [facility] [email or web address] 08/23/2020, 2:15 PM

## 2020-08-23 NOTE — Progress Notes (Signed)
Pts. family at bedside, family member stays with pt. h/s.

## 2020-08-24 ENCOUNTER — Inpatient Hospital Stay (HOSPITAL_COMMUNITY): Payer: Managed Care, Other (non HMO)

## 2020-08-24 DIAGNOSIS — E876 Hypokalemia: Secondary | ICD-10-CM

## 2020-08-24 DIAGNOSIS — R7881 Bacteremia: Secondary | ICD-10-CM | POA: Diagnosis not present

## 2020-08-24 LAB — CULTURE, BLOOD (ROUTINE X 2)

## 2020-08-24 LAB — CBC WITH DIFFERENTIAL/PLATELET
Abs Immature Granulocytes: 0.02 10*3/uL (ref 0.00–0.07)
Basophils Absolute: 0 10*3/uL (ref 0.0–0.1)
Basophils Relative: 1 %
Eosinophils Absolute: 0 10*3/uL (ref 0.0–0.5)
Eosinophils Relative: 1 %
HCT: 35.5 % — ABNORMAL LOW (ref 39.0–52.0)
Hemoglobin: 11.2 g/dL — ABNORMAL LOW (ref 13.0–17.0)
Immature Granulocytes: 1 %
Lymphocytes Relative: 45 %
Lymphs Abs: 2 10*3/uL (ref 0.7–4.0)
MCH: 26.5 pg (ref 26.0–34.0)
MCHC: 31.5 g/dL (ref 30.0–36.0)
MCV: 84.1 fL (ref 80.0–100.0)
Monocytes Absolute: 0.8 10*3/uL (ref 0.1–1.0)
Monocytes Relative: 17 %
Neutro Abs: 1.6 10*3/uL — ABNORMAL LOW (ref 1.7–7.7)
Neutrophils Relative %: 35 %
Platelets: 98 10*3/uL — ABNORMAL LOW (ref 150–400)
RBC: 4.22 MIL/uL (ref 4.22–5.81)
RDW: 15.4 % (ref 11.5–15.5)
WBC: 4.4 10*3/uL (ref 4.0–10.5)
nRBC: 0 % (ref 0.0–0.2)

## 2020-08-24 LAB — GLUCOSE, CAPILLARY
Glucose-Capillary: 103 mg/dL — ABNORMAL HIGH (ref 70–99)
Glucose-Capillary: 95 mg/dL (ref 70–99)
Glucose-Capillary: 103 mg/dL — ABNORMAL HIGH (ref 70–99)
Glucose-Capillary: 101 mg/dL — ABNORMAL HIGH (ref 70–99)
Glucose-Capillary: 97 mg/dL (ref 70–99)
Glucose-Capillary: 95 mg/dL (ref 70–99)
Glucose-Capillary: 133 mg/dL — ABNORMAL HIGH (ref 70–99)
Glucose-Capillary: 116 mg/dL — ABNORMAL HIGH (ref 70–99)

## 2020-08-24 LAB — COMPREHENSIVE METABOLIC PANEL
ALT: 278 U/L — ABNORMAL HIGH (ref 0–44)
AST: 111 U/L — ABNORMAL HIGH (ref 15–41)
Albumin: 3.1 g/dL — ABNORMAL LOW (ref 3.5–5.0)
Alkaline Phosphatase: 171 U/L — ABNORMAL HIGH (ref 38–126)
Anion gap: 6 (ref 5–15)
BUN: 7 mg/dL (ref 6–20)
CO2: 24 mmol/L (ref 22–32)
Calcium: 8.3 mg/dL — ABNORMAL LOW (ref 8.9–10.3)
Chloride: 105 mmol/L (ref 98–111)
Creatinine, Ser: <0.3 mg/dL — ABNORMAL LOW (ref 0.61–1.24)
Glucose, Bld: 96 mg/dL (ref 70–99)
Potassium: 4.1 mmol/L (ref 3.5–5.1)
Sodium: 135 mmol/L (ref 135–145)
Total Bilirubin: 0.7 mg/dL (ref 0.3–1.2)
Total Protein: 6.1 g/dL — ABNORMAL LOW (ref 6.5–8.1)

## 2020-08-24 MED ORDER — FUROSEMIDE 10 MG/ML IJ SOLN
40.0000 mg | Freq: Once | INTRAMUSCULAR | Status: AC
  Administered 2020-08-24: 40 mg via INTRAVENOUS
  Filled 2020-08-24: qty 4

## 2020-08-24 NOTE — Progress Notes (Signed)
Pt. seen on BiPAP/CPAP rounds, X family members in room, pt. tolerating Trilogy home BiPAP well, family member remaining with pt. this evening thru night.

## 2020-08-24 NOTE — Progress Notes (Signed)
[facility] PROGRESS NOTE    Progress Note  Peter Hayes  OIZ:[id number] DOB: Apr 13, 1996 DOA: 08/21/2020 PCP: Penni Bombard, PA     Brief Narrative:   Peter Hayes is an 24 y.o. 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 on TPN through PICC line chronically and recurrent pneumonias who came back to the ED on 08/21/2020, is brought into the ED for confusion today prior to admission he was found to have a fever and a chest x-ray showing possible pneumonia saturations remained stable blood cultures were ordered, on the day of admission he was brought in with worsening confusion and blood cultures were positive for staph aureus from blood drawn from the PICC line was started on IV Ancef  Assessment/Plan:   Severe sepsis due to MSSA bacteremia: Blood cultures on 1114 was positive for staph, surveillance blood culture on 16 November have remained negative till date. PICC line was discontinued will need a line holiday. TTE showed the presence of a PICC line into the right atrium no vegetations noted. Due to tenuous respiratory status TEE unable to be performed.  Bronchopneumonia: He is at high risk of aspiration, currently on IV Ancef has remained afebrile leukopenia has improved.  Left pneumothorax: Repeated chest x-ray showed a very small pneumothorax, I have personally reviewed the chest x-ray and it shows fluid in the fissure, his BUN is low we will give him 1 dose on IV Lasix, he is positive about 4 L.  Acute metabolic encephalopathy: Likely due to infectious etiology plus minus hypoglycemia.  Hyperglycemia: Newly on IV dextrose infusion CBGs every 2, they have remained relatively stable can be switched to every 4.  Hypokalemia: Repleted orally now resolved.  We will keep in close eye as we are diuresing him.  Chronic respiratory failure with hypoxia and hypercarbia: BiPAP  dependent on 2 L of oxygen.  Right pleural effusion: There is currently not exacerbating his shortness of breath we will continue to monitor.  Elevated LFTs: Abdominal ultrasound unremarkable CT scan no acute findings now improving.  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  Severe protein caloric malnutrition: Continue TPN once central line can be reinserted.  The family is more amenable to a J-tube, they will follow-up with GI/surgery as an outpatient to discuss risk and benefits.  Duchenne's muscular dystrophy: Noted.  Sinus tach: Continue metoprolol.  Hypotension: Appears to be a chronic issue currently on midodrine.  Pancytopenia: His leukopenia is improving.  Normocytic anemia: Suspect due to malnutrition.  He is a Restaurant manager, fast food.   Sacral decubitus ulcer stage II present on admission: RN Pressure Injury Documentation: Pressure Injury 09/16/19 Nose Upper Stage II -  Partial thickness loss of dermis presenting as a shallow open ulcer with a red, pink wound bed without slough. Exposed cartiledge from [place] at home (Active)  09/16/19 1000  Location: Nose  Location Orientation: Upper  Staging: Stage II -  Partial thickness loss of dermis presenting as a shallow open ulcer with a red, pink wound bed without slough.  Wound Description (Comments): Exposed cartiledge from Bipap mask at home  Present on Admission: Yes      DVT prophylaxis: lovenox Family Communication:father Status is: Inpatient  Remains inpatient appropriate because:Hemodynamically unstable   Dispo: The patient is from: Home              Anticipated d/c is to:  Home              Anticipated d/c date is: > 3 days              Patient currently is not medically stable to d/c.        Code Status:     Code Status Orders  (From admission, onward)         Start     Ordered   08/21/20 1223  Full code   Continuous        08/21/20 1222        Code Status History    Date Active Date Inactive Code Status Order ID Comments User Context   06/14/2020 2131 06/21/2020 1941 Full Code [id number]  Lenore Cordia, MD ED   02/07/2020 0323 02/08/2020 2204 Full Code [id number]  Chauncey Mann, MD Inpatient   02/06/2020 1954 02/07/2020 0323 Full Code [id number]  Arlan Organ, DO ED   09/16/2019 1134 09/21/2019 1815 Full Code [id number]  Truddie Hidden, MD Inpatient   09/16/2019 0458 09/16/2019 1134 Full Code [id number]  Etta Quill, DO ED   08/08/2018 0303 09/02/2018 1741 DNR [id number]  Gordan Payment, CNA Inpatient   05/16/2018 0549 05/20/2018 1727 Full Code [id number]  Reyne Dumas, MD ED   01/10/2018 0614 01/15/2018 1719 Full Code [id number]  Stark Klein, MD ED   12/01/2017 2110 12/04/2017 2005 Full Code [id number]  Etta Quill, DO Inpatient   09/24/2017 0237 09/25/2017 2117 Full Code [id number]  Vianne Bulls, MD Inpatient   05/05/2016 0715 05/11/2016 1837 Full Code [id number]  Maren Reamer, MD Inpatient   Advance Care Planning Activity        IV Access:    Peripheral IV   Procedures and diagnostic studies:   DG CHEST PORT 1 VIEW  Result Date: 08/24/2020 CLINICAL DATA:  Pneumothorax, pleural effusion EXAM: PORTABLE CHEST 1 VIEW COMPARISON:  08/23/2020 FINDINGS: Lung volumes are small, but are symmetric. Wedge like opacity within the right mid lung zone likely represents fluid within the fissure and is unchanged. Tiny left apical pneumothorax is again identified, slightly decreased in size since prior examination. No superimposed focal pulmonary infiltrate. No pneumothorax or dependent pleural effusion. Cardiac size within normal limits. Pulmonary vascularity is normal. IMPRESSION: Tiny left apical pneumothorax, decreased. Stable wedge like opacity within the right mid lung zone likely representing fluid within the fissure. Electronically Signed   By: Fidela Salisbury MD   On: 08/24/2020 05:51   DG  CHEST PORT 1 VIEW  Result Date: 08/23/2020 CLINICAL DATA:  Pneumothorax. EXAM: PORTABLE CHEST 1 VIEW COMPARISON:  Chest x-ray 08/22/2020.  CT chest 08/21/2020. FINDINGS: Mediastinum and hilar structures are normal. Heart size normal. Persistent atelectatic changes right mid lung. Persistent right pleural effusion. Tiny left pleural effusion cannot be excluded. Persistent small left apical pneumothorax. No acute bony abnormality identified. IMPRESSION: 1. Persistent small left apical pneumothorax.  No interim change. 2. Persistent atelectatic changes right mid lung. Persistent right pleural effusion. Tiny left pleural effusion cannot be excluded. Electronically Signed   By: Marcello Moores  Register   On: 08/23/2020 06:57     Medical Consultants:    None.  Anti-Infectives:   Ancef  Subjective:    Peter Hayes he has no new complaints this morning, he relates he is little bit hungry.  Objective:    Vitals:   08/23/20 1535 08/23/20 2134 08/24/20 0500 08/24/20 0513  BP:  101/71    Pulse:  Resp:      Temp:  (!) 97.2 F (36.2 C)  (!) 97.4 F (36.3 C)  TempSrc:  Axillary  Axillary  SpO2: 100%     Weight:   41.4 kg   Height:       SpO2: 100 % O2 Flow Rate (L/min): 99 L/min FiO2 (%):  (2l)   Intake/Output Summary (Last 24 hours) at 08/24/2020 0734 Last data filed at 08/24/2020 0500 Gross per 24 hour  Intake 1795.83 ml  Output 750 ml  Net 1045.83 ml   Filed Weights   08/21/20 0631 08/22/20 0500 08/24/20 0500  Weight: 40.8 kg 40.8 kg 41.4 kg    Exam: General exam: In no acute distress, muscle wasting Respiratory system: Good air movement and clear to auscultation. Cardiovascular system: S1 & S2 heard, RRR. No JVD. Gastrointestinal system: Abdomen is nondistended, soft and nontender.  Extremities: No pedal edema. Skin: No rashes, lesions or ulcers Psychiatry: Judgement and insight appear normal. Mood & affect appropriate.    Data Reviewed:    Labs: Basic Metabolic  Panel: Recent Labs  Lab 08/21/20 0814 08/21/20 0922 08/21/20 1305 08/21/20 1305 08/22/20 0413 08/22/20 0413 08/22/20 1627 08/22/20 1627 08/23/20 0424 08/24/20 0433  NA   < >  --  134*  --  135  --  132*  --  133* 135  K   < >  --  2.8*   < > 2.2*   < > 4.2   < > 3.8 4.1  CL   < >  --  102  --  101  --  99  --  100 105  CO2   < >  --  25  --  25  --  26  --  26 24  GLUCOSE   < >  --  69*  --  109*  --  132*  --  101* 96  BUN   < >  --  13  --  12  --  7  --  <5* 7  CREATININE   < >  --  <0.30*  --  <0.30*  --  <0.30*  --  <0.30* <0.30*  CALCIUM   < >  --  8.4*  --  8.3*  --  8.3*  --  8.3* 8.3*  MG  --  1.8  --   --   --   --   --   --   --   --    < > = values in this interval not displayed.   GFR CrCl cannot be calculated (This lab value cannot be used to calculate CrCl because it is not a number: <0.30). Liver Function Tests: Recent Labs  Lab 08/20/20 1616 08/21/20 0814 08/22/20 0413 08/23/20 0424 08/24/20 0433  AST 110* 1,897* 621* 233* 111*  ALT 173* 1,573* 802* 506* 278*  ALKPHOS 163* 292* 224* 190* 171*  BILITOT 1.2 3.0* 1.3* 0.8 0.7  PROT 8.5* 8.5* 6.0* 6.5 6.1*  ALBUMIN 4.4 4.2 3.1* 3.3* 3.1*   Recent Labs  Lab 08/20/20 1616 08/21/20 0922  LIPASE 27 21   No results for input(s): AMMONIA in the last 168 hours. Coagulation profile Recent Labs  Lab 08/21/20 0814 08/22/20 0413 08/23/20 0424  INR 1.7* 2.2* 1.1   COVID-19 Labs  Recent Labs    08/23/20 0424  FERRITIN 341*    Lab Results  Component Value Date   SARSCOV2NAA NEGATIVE 08/21/2020   SARSCOV2NAA NEGATIVE 08/20/2020   SARSCOV2NAA NEGATIVE 06/14/2020  [place] NEGATIVE 02/06/2020    CBC: Recent Labs  Lab 08/20/20 1616 08/21/20 0814 08/22/20 0413 08/23/20 0424 08/24/20 0433  WBC 7.2 7.6 2.8* 3.2* 4.4  NEUTROABS 6.3 7.3  --  1.4* 1.6*  HGB 12.6* 13.0 10.8* 10.8* 11.2*  HCT 39.6 41.0 33.6* 33.3* 35.5*  MCV 82.8 84.7 82.6 82.8 84.1  PLT 105* 95* 72* 87* 98*   Cardiac  Enzymes: No results for input(s): CKTOTAL, CKMB, CKMBINDEX, TROPONINI in the last 168 hours. BNP (last 3 results) No results for input(s): PROBNP in the last 8760 hours. CBG: Recent Labs  Lab 08/23/20 2314 08/24/20 0101 08/24/20 0259 08/24/20 0516 08/24/20 0658  GLUCAP 101* 103* 95 103* 101*   D-Dimer: No results for input(s): DDIMER in the last 72 hours. Hgb A1c: No results for input(s): HGBA1C in the last 72 hours. Lipid Profile: No results for input(s): CHOL, HDL, LDLCALC, TRIG, CHOLHDL, LDLDIRECT in the last 72 hours. Thyroid function studies: No results for input(s): TSH, T4TOTAL, T3FREE, THYROIDAB in the last 72 hours.  Invalid input(s): FREET3 Anemia work up: Recent Labs    08/23/20 0424  VITAMINB12 2,030*  FOLATE 21.5  FERRITIN 341*  TIBC 247*  IRON 19*  RETICCTPCT 0.6   Sepsis Labs: Recent Labs  Lab 08/20/20 1616 08/20/20 1616 08/20/20 1848 08/21/20 0814 08/21/20 1724 08/22/20 0413 08/23/20 0424 08/24/20 0433  PROCALCITON  --   --   --   --   --  33.84  --   --   WBC 7.2   < >  --  7.6  --  2.8* 3.2* 4.4  LATICACIDVEN 0.6  --  0.6 1.3 1.6  --   --   --    < > = values in this interval not displayed.   Microbiology Recent Results (from the past 240 hour(s))  Culture, blood (routine x 2)     Status: Abnormal   Collection Time: 08/20/20  4:10 PM   Specimen: BLOOD LEFT ARM  Result Value Ref Range Status   Specimen Description   Final    BLOOD LEFT ARM UPPER PICC Performed at [facility], [address] [place], [place] [address]    Special Requests   Final    BOTTLES DRAWN AEROBIC AND ANAEROBIC Blood Culture adequate volume Performed at [facility], [address] [place], [place] [address]    Culture  Setup Time   Final    GRAM POSITIVE COCCI IN BOTH AEROBIC AND ANAEROBIC BOTTLES Organism ID to follow CRITICAL RESULT CALLED TO, READ BACK BY AND VERIFIED WITH: PHRMD M LILLISON $RemoveBe'[email or web address]'[email or web address]$  08/21/20 BY S GEZAHEGN Performed  at [facility], [place] [address], [place], [place] [address]    Culture STAPHYLOCOCCUS AUREUS (A)  Final   Report Status 08/23/2020 FINAL  Final   Organism ID, Bacteria STAPHYLOCOCCUS AUREUS  Final      Susceptibility   Staphylococcus aureus - MIC*    CIPROFLOXACIN <=0.5 SENSITIVE Sensitive     ERYTHROMYCIN <=0.25 SENSITIVE Sensitive     GENTAMICIN <=0.5 SENSITIVE Sensitive     OXACILLIN 0.5 SENSITIVE Sensitive     TETRACYCLINE <=1 SENSITIVE Sensitive     VANCOMYCIN 1 SENSITIVE Sensitive     TRIMETH/SULFA <=10 SENSITIVE Sensitive     CLINDAMYCIN <=0.25 SENSITIVE Sensitive     RIFAMPIN <=0.5 SENSITIVE Sensitive     Inducible Clindamycin NEGATIVE Sensitive     * STAPHYLOCOCCUS AUREUS  Blood Culture ID Panel (Reflexed)  Status: Abnormal   Collection Time: 08/20/20  4:10 PM  Result Value Ref Range Status   Enterococcus faecalis NOT DETECTED NOT DETECTED Final   Enterococcus Faecium NOT DETECTED NOT DETECTED Final   Listeria monocytogenes NOT DETECTED NOT DETECTED Final   Staphylococcus species DETECTED (A) NOT DETECTED Final    Comment: CRITICAL RESULT CALLED TO, READ BACK BY AND VERIFIED WITH: PHRMD M LILLISON $RemoveBe'@0626'[email or web address]$  08/21/20 BY S GEZAHEGN    Staphylococcus aureus (BCID) DETECTED (A) NOT DETECTED Final    Comment: CRITICAL RESULT CALLED TO, READ BACK BY AND VERIFIED WITH: PHRMD M LILLISON $RemoveBe'@0626'[email or web address]$  08/21/20 BY S GEZAHEGN    Staphylococcus epidermidis NOT DETECTED NOT DETECTED Final   Staphylococcus lugdunensis NOT DETECTED NOT DETECTED Final   Streptococcus species NOT DETECTED NOT DETECTED Final   Streptococcus agalactiae NOT DETECTED NOT DETECTED Final   Streptococcus pneumoniae NOT DETECTED NOT DETECTED Final   Streptococcus pyogenes NOT DETECTED NOT DETECTED Final   A.calcoaceticus-baumannii NOT DETECTED NOT DETECTED Final   Bacteroides fragilis NOT DETECTED NOT DETECTED Final   Enterobacterales NOT DETECTED NOT DETECTED Final   Enterobacter cloacae complex NOT DETECTED  NOT DETECTED Final   Escherichia coli NOT DETECTED NOT DETECTED Final   Klebsiella aerogenes NOT DETECTED NOT DETECTED Final   Klebsiella oxytoca NOT DETECTED NOT DETECTED Final   Klebsiella pneumoniae NOT DETECTED NOT DETECTED Final   Proteus species NOT DETECTED NOT DETECTED Final   Salmonella species NOT DETECTED NOT DETECTED Final   Serratia marcescens NOT DETECTED NOT DETECTED Final   Haemophilus influenzae NOT DETECTED NOT DETECTED Final   Neisseria meningitidis NOT DETECTED NOT DETECTED Final   Pseudomonas aeruginosa NOT DETECTED NOT DETECTED Final   Stenotrophomonas maltophilia NOT DETECTED NOT DETECTED Final   Candida albicans NOT DETECTED NOT DETECTED Final   Candida auris NOT DETECTED NOT DETECTED Final   Candida glabrata NOT DETECTED NOT DETECTED Final   Candida krusei NOT DETECTED NOT DETECTED Final   Candida parapsilosis NOT DETECTED NOT DETECTED Final   Candida tropicalis NOT DETECTED NOT DETECTED Final   Cryptococcus neoformans/gattii NOT DETECTED NOT DETECTED Final   Meth resistant mecA/C and MREJ NOT DETECTED NOT DETECTED Final    Comment: Performed at [facility], [address] [place], [place] [address]  Respiratory Panel by RT PCR (Flu A&B, Covid) - Nasopharyngeal Swab     Status: None   Collection Time: 08/20/20  4:16 PM   Specimen: Nasopharyngeal Swab  Result Value Ref Range Status   SARS Coronavirus 2 by RT PCR NEGATIVE NEGATIVE Final    Comment: (NOTE) SARS-CoV-2 target nucleic acids are NOT DETECTED.  The SARS-CoV-2 RNA is generally detectable in upper respiratoy specimens during the acute phase of infection. The lowest concentration of SARS-CoV-2 viral copies this assay can detect is 131 copies/mL. A negative result does not preclude SARS-Cov-2 infection and should not be used as the sole basis for treatment or other patient management decisions. A negative result may occur with  improper specimen collection/handling, submission of specimen  other than nasopharyngeal swab, presence of viral mutation(s) within the areas targeted by this assay, and inadequate number of viral copies (<131 copies/mL). A negative result must be combined with clinical observations, patient history, and epidemiological information. The expected result is Negative.  Fact Sheet for Patients:  PinkCheek.be  Fact Sheet for Healthcare Providers:  GravelBags.it  This test is no t yet approved or cleared by the [place] FDA and  has been authorized for detection and/or diagnosis of  SARS-CoV-2 by FDA under an Emergency Use Authorization (EUA). This EUA will remain  in effect (meaning this test can be used) for the duration of the COVID-19 declaration under Section 564(b)(1) of the Act, 21 U.S.C. section 360bbb-3(b)(1), unless the authorization is terminated or revoked sooner.     Influenza A by PCR NEGATIVE NEGATIVE Final   Influenza B by PCR NEGATIVE NEGATIVE Final    Comment: (NOTE) The Xpert Xpress SARS-CoV-2/FLU/RSV assay is intended as an aid in  the diagnosis of influenza from Nasopharyngeal swab specimens and  should not be used as a sole basis for treatment. Nasal washings and  aspirates are unacceptable for Xpert Xpress SARS-CoV-2/FLU/RSV  testing.  Fact Sheet for Patients: PinkCheek.be  Fact Sheet for Healthcare Providers: GravelBags.it  This test is not yet approved or cleared by the [place] FDA and  has been authorized for detection and/or diagnosis of SARS-CoV-2 by  FDA under an Emergency Use Authorization (EUA). This EUA will remain  in effect (meaning this test can be used) for the duration of the  Covid-19 declaration under Section 564(b)(1) of the Act, 21  U.S.C. section 360bbb-3(b)(1), unless the authorization is  terminated or revoked. Performed at [facility], [address]  [place], [place] [address]   Urine culture     Status: Abnormal   Collection Time: 08/20/20  7:36 PM   Specimen: Urine, Random  Result Value Ref Range Status   Specimen Description   Final    URINE, RANDOM Performed at [facility], [address] [place], [place] [address]    Special Requests   Final    NONE Performed at [facility], [address] [place], [place] [address]    Culture (A)  Final    60,000 COLONIES/mL ESCHERICHIA COLI 30,000 COLONIES/mL ENTEROCOCCUS FAECALIS    Report Status 08/23/2020 FINAL  Final   Organism ID, Bacteria ESCHERICHIA COLI (A)  Final   Organism ID, Bacteria ENTEROCOCCUS FAECALIS (A)  Final      Susceptibility   Escherichia coli - MIC*    AMPICILLIN >=32 RESISTANT Resistant     CEFAZOLIN <=4 SENSITIVE Sensitive     CEFEPIME <=0.12 SENSITIVE Sensitive     CEFTRIAXONE <=0.25 SENSITIVE Sensitive     CIPROFLOXACIN >=4 RESISTANT Resistant     GENTAMICIN <=1 SENSITIVE Sensitive     IMIPENEM <=0.25 SENSITIVE Sensitive     NITROFURANTOIN <=16 SENSITIVE Sensitive     TRIMETH/SULFA <=20 SENSITIVE Sensitive     AMPICILLIN/SULBACTAM >=32 RESISTANT Resistant     PIP/TAZO <=4 SENSITIVE Sensitive     * 60,000 COLONIES/mL ESCHERICHIA COLI   Enterococcus faecalis - MIC*    AMPICILLIN <=2 SENSITIVE Sensitive     NITROFURANTOIN <=16 SENSITIVE Sensitive     VANCOMYCIN 1 SENSITIVE Sensitive     * 30,000 COLONIES/mL ENTEROCOCCUS FAECALIS  Urine culture     Status: Abnormal   Collection Time: 08/21/20  8:14 AM   Specimen: In/Out Cath Urine  Result Value Ref Range Status   Specimen Description   Final    IN/OUT CATH URINE Performed at [facility], [place] [address], [place], [place] [address]    Special Requests   Final    URINE, RANDOM Performed at [place] [address] [place], [place] [address]    Culture (A)  Final    20,000 COLONIES/mL ESCHERICHIA COLI 20,000 COLONIES/mL ENTEROCOCCUS  FAECALIS    Report Status 08/23/2020 FINAL  Final   Organism ID, Bacteria ESCHERICHIA COLI (A)  Final   Organism ID, Bacteria ENTEROCOCCUS FAECALIS (A)  Final      Susceptibility   Escherichia coli - MIC*    AMPICILLIN >=32 RESISTANT Resistant     CEFAZOLIN 16 SENSITIVE Sensitive     CEFEPIME <=0.12 SENSITIVE Sensitive     CEFTRIAXONE <=0.25 SENSITIVE Sensitive     CIPROFLOXACIN >=4 RESISTANT Resistant     GENTAMICIN <=1 SENSITIVE Sensitive     IMIPENEM <=0.25 SENSITIVE Sensitive     NITROFURANTOIN <=16 SENSITIVE Sensitive     TRIMETH/SULFA <=20 SENSITIVE Sensitive     AMPICILLIN/SULBACTAM >=32 RESISTANT Resistant     PIP/TAZO <=4 SENSITIVE Sensitive     * 20,000 COLONIES/mL ESCHERICHIA COLI   Enterococcus faecalis - MIC*    AMPICILLIN <=2 SENSITIVE Sensitive     NITROFURANTOIN <=16 SENSITIVE Sensitive     VANCOMYCIN 1 SENSITIVE Sensitive     * 20,000 COLONIES/mL ENTEROCOCCUS FAECALIS  Blood culture (routine x 2)     Status: Abnormal   Collection Time: 08/21/20  8:14 AM   Specimen: BLOOD RIGHT ARM  Result Value Ref Range Status   Specimen Description   Final    BLOOD RIGHT ARM Performed at [facility], [address], [place], [place] [address]    Special Requests   Final    BOTTLES DRAWN AEROBIC ONLY Blood Culture results may not be optimal due to an inadequate volume of blood received in culture bottles Performed at [place] [address] [place], [place] [address]    Culture  Setup Time   Final    GRAM POSITIVE COCCI IN CLUSTERS AEROBIC BOTTLE ONLY Organism ID to follow CRITICAL RESULT CALLED TO, READ BACK BY AND VERIFIED WITH: Barth Kirks [facility] [id number] 08/22/20 A BROWNING Performed at [facility], [address], [place], [place] [address]    Culture STAPHYLOCOCCUS AUREUS (A)  Final   Report Status 08/24/2020 FINAL  Final   Organism ID, Bacteria STAPHYLOCOCCUS AUREUS  Final      Susceptibility   Staphylococcus aureus - MIC*     CIPROFLOXACIN <=0.5 SENSITIVE Sensitive     ERYTHROMYCIN <=0.25 SENSITIVE Sensitive     GENTAMICIN <=0.5 SENSITIVE Sensitive     OXACILLIN <=0.25 SENSITIVE Sensitive     TETRACYCLINE <=1 SENSITIVE Sensitive     VANCOMYCIN 1 SENSITIVE Sensitive     TRIMETH/SULFA <=10 SENSITIVE Sensitive     CLINDAMYCIN <=0.25 SENSITIVE Sensitive     RIFAMPIN <=0.5 SENSITIVE Sensitive     Inducible Clindamycin NEGATIVE Sensitive     * STAPHYLOCOCCUS AUREUS  Blood Culture ID Panel (Reflexed)     Status: Abnormal   Collection Time: 08/21/20  8:14 AM  Result Value Ref Range Status   Enterococcus faecalis NOT DETECTED NOT DETECTED Final   Enterococcus Faecium NOT DETECTED NOT DETECTED Final   Listeria monocytogenes NOT DETECTED NOT DETECTED Final   Staphylococcus species DETECTED (A) NOT DETECTED Final    Comment: CRITICAL RESULT CALLED TO, READ BACK BY AND VERIFIED WITH: Barth Kirks PHARMD 1911 08/22/20 A BROWNING    Staphylococcus aureus (BCID) DETECTED (A) NOT DETECTED Final    Comment: CRITICAL RESULT CALLED TO, READ BACK BY AND VERIFIED WITH: Barth Kirks PHARMD 1911 08/22/20 A BROWNING    Staphylococcus epidermidis NOT DETECTED NOT DETECTED Final   Staphylococcus lugdunensis NOT DETECTED NOT DETECTED Final   Streptococcus species NOT DETECTED NOT DETECTED Final   Streptococcus agalactiae NOT DETECTED  NOT DETECTED Final   Streptococcus pneumoniae NOT DETECTED NOT DETECTED Final   Streptococcus pyogenes NOT DETECTED NOT DETECTED Final   A.calcoaceticus-baumannii NOT DETECTED NOT DETECTED Final   Bacteroides fragilis NOT DETECTED NOT DETECTED Final   Enterobacterales NOT DETECTED NOT DETECTED Final   Enterobacter cloacae complex NOT DETECTED NOT DETECTED Final   Escherichia coli NOT DETECTED NOT DETECTED Final   Klebsiella aerogenes NOT DETECTED NOT DETECTED Final   Klebsiella oxytoca NOT DETECTED NOT DETECTED Final   Klebsiella pneumoniae NOT DETECTED NOT DETECTED Final   Proteus species NOT DETECTED  NOT DETECTED Final   Salmonella species NOT DETECTED NOT DETECTED Final   Serratia marcescens NOT DETECTED NOT DETECTED Final   Haemophilus influenzae NOT DETECTED NOT DETECTED Final   Neisseria meningitidis NOT DETECTED NOT DETECTED Final   Pseudomonas aeruginosa NOT DETECTED NOT DETECTED Final   Stenotrophomonas maltophilia NOT DETECTED NOT DETECTED Final   Candida albicans NOT DETECTED NOT DETECTED Final   Candida auris NOT DETECTED NOT DETECTED Final   Candida glabrata NOT DETECTED NOT DETECTED Final   Candida krusei NOT DETECTED NOT DETECTED Final   Candida parapsilosis NOT DETECTED NOT DETECTED Final   Candida tropicalis NOT DETECTED NOT DETECTED Final   Cryptococcus neoformans/gattii NOT DETECTED NOT DETECTED Final   Meth resistant mecA/C and MREJ NOT DETECTED NOT DETECTED Final    Comment: Performed at [facility], [address], [place], [place] [address]  Respiratory Panel by RT PCR (Flu A&B, Covid) - Nasopharyngeal Swab     Status: None   Collection Time: 08/21/20  8:27 AM   Specimen: Nasopharyngeal Swab  Result Value Ref Range Status   SARS Coronavirus 2 by RT PCR NEGATIVE NEGATIVE Final    Comment: (NOTE) SARS-CoV-2 target nucleic acids are NOT DETECTED.  The SARS-CoV-2 RNA is generally detectable in upper respiratoy specimens during the acute phase of infection. The lowest concentration of SARS-CoV-2 viral copies this assay can detect is 131 copies/mL. A negative result does not preclude SARS-Cov-2 infection and should not be used as the sole basis for treatment or other patient management decisions. A negative result may occur with  improper specimen collection/handling, submission of specimen other than nasopharyngeal swab, presence of viral mutation(s) within the areas targeted by this assay, and inadequate number of viral copies (<131 copies/mL). A negative result must be combined with clinical observations, patient history, and epidemiological information.  The expected result is Negative.  Fact Sheet for Patients:  PinkCheek.be  Fact Sheet for Healthcare Providers:  GravelBags.it  This test is no t yet approved or cleared by the [place] FDA and  has been authorized for detection and/or diagnosis of SARS-CoV-2 by FDA under an Emergency Use Authorization (EUA). This EUA will remain  in effect (meaning this test can be used) for the duration of the COVID-19 declaration under Section 564(b)(1) of the Act, 21 U.S.C. section 360bbb-3(b)(1), unless the authorization is terminated or revoked sooner.     Influenza A by PCR NEGATIVE NEGATIVE Final   Influenza B by PCR NEGATIVE NEGATIVE Final    Comment: (NOTE) The Xpert Xpress SARS-CoV-2/FLU/RSV assay is intended as an aid in  the diagnosis of influenza from Nasopharyngeal swab specimens and  should not be used as a sole basis for treatment. Nasal washings and  aspirates are unacceptable for Xpert Xpress SARS-CoV-2/FLU/RSV  testing.  Fact Sheet for Patients: PinkCheek.be  Fact Sheet for Healthcare Providers: GravelBags.it  This test is not yet approved or cleared by the  [place] [employer] and  has been authorized for detection and/or diagnosis of SARS-CoV-2 by  FDA under an Print production planner (EUA). This EUA will remain  in effect (meaning this test can be used) for the duration of the  Covid-19 declaration under Section 564(b)(1) of the Act, 21  U.S.C. section 360bbb-3(b)(1), unless the authorization is  terminated or revoked. Performed at [facility], [place] [address] [place], [place] [address]   Culture, blood (routine x 2)     Status: None (Preliminary result)   Collection Time: 08/23/20  7:20 AM   Specimen: BLOOD RIGHT HAND  Result Value Ref Range Status   Specimen Description   Final    BLOOD RIGHT HAND Performed at [place] [address] [place], [place] [address]    Special Requests   Final    BOTTLES DRAWN AEROBIC ONLY Blood Culture adequate volume Performed at [place] [address], [place], [place] [address]    Culture   Final    NO GROWTH < 12 HOURS Performed at [place] [address], [place], [place] [address]    Report Status PENDING  Incomplete  Culture, blood (routine x 2)     Status: None (Preliminary result)   Collection Time: 08/23/20  7:36 AM   Specimen: BLOOD RIGHT HAND  Result Value Ref Range Status   Specimen Description   Final    BLOOD RIGHT HAND Performed at [place] [address] [place], [place] [address]    Special Requests   Final    BOTTLES DRAWN AEROBIC ONLY Blood Culture adequate volume Performed at [place] [address] [place], [place] [address]    Culture   Final    NO GROWTH < 12 HOURS Performed at [place] [address], [place], [place] [address]    Report Status PENDING  Incomplete     Medications:   . (feeding supplement) PROSource Plus  30 mL Oral BID BM  . chlorhexidine  15 mL Mouth Rinse BID  . enoxaparin (LOVENOX) injection  30 mg Subcutaneous Q24H  . feeding supplement  1 Container Oral TID BM  . magnesium oxide  400 mg Oral TID  . mouth rinse  15 mL Mouth Rinse q12n4p  . metoprolol tartrate  25 mg Oral Q8H  . multivitamin with minerals  1 tablet Oral Daily  . scopolamine  1 patch Transdermal Q72H  . simethicone  40 mg Oral QID  . sodium chloride flush  3 mL Intravenous Q12H   Continuous Infusions: .  ceFAZolin (ANCEF) IV 2 g (08/24/20 0523)  . dextrose 10 % and 0.45 % NaCl 1,000 mL with potassium chloride 40 mEq infusion 50 mL/hr at 08/23/20 1727      LOS: 3 days   Charlynne Cousins  [facility]  08/24/2020, 7:34 AM

## 2020-08-24 NOTE — Progress Notes (Signed)
[facility] for Infectious Disease   Reason for visit: Follow up on MSSA bacteremia  Interval History: repeat blood culture again positive.  No acute events.  WBC 34.4 and remains afebrile. No new complaints.    Physical Exam: Constitutional:  Vitals:   08/24/20 1319 08/24/20 1400  BP:    Pulse:  79  Resp:  12  Temp: (!) 97.4 F (36.3 C)   SpO2:  100%   patient appears in NAD Respiratory: Normal respiratory effort on mask; CTA B Cardiovascular: RRR  Review of Systems: Constitutional: negative for fevers and chills Gastrointestinal: negative for nausea and diarrhea  Lab Results  Component Value Date   WBC 4.4 08/24/2020   HGB 11.2 (L) 08/24/2020   HCT 35.5 (L) 08/24/2020   MCV 84.1 08/24/2020   PLT 98 (L) 08/24/2020    Lab Results  Component Value Date   CREATININE <0.30 (L) 08/24/2020   BUN 7 08/24/2020   NA 135 08/24/2020   K 4.1 08/24/2020   CL 105 08/24/2020   CO2 24 08/24/2020    Lab Results  Component Value Date   ALT 278 (H) 08/24/2020   AST 111 (H) 08/24/2020   ALKPHOS 171 (H) 08/24/2020     Microbiology: Recent Results (from the past 240 hour(s))  Culture, blood (routine x 2)     Status: Abnormal   Collection Time: 08/20/20  4:10 PM   Specimen: BLOOD LEFT ARM  Result Value Ref Range Status   Specimen Description   Final    BLOOD LEFT ARM UPPER PICC Performed at [facility], [address] [place], [place] [address]    Special Requests   Final    BOTTLES DRAWN AEROBIC AND ANAEROBIC Blood Culture adequate volume Performed at [facility], [address] [place], [place] [address]    Culture  Setup Time   Final    GRAM POSITIVE COCCI IN BOTH AEROBIC AND ANAEROBIC BOTTLES Organism ID to follow CRITICAL RESULT CALLED TO, READ BACK BY AND VERIFIED WITH: PHRMD M LILLISON $[email or web address]$  08/21/20 BY S GEZAHEGN Performed at [facility], [address], [place], [place] [address]    Culture STAPHYLOCOCCUS AUREUS (A)   Final   Report Status 08/23/2020 FINAL  Final   Organism ID, Bacteria STAPHYLOCOCCUS AUREUS  Final      Susceptibility   Staphylococcus aureus - MIC*    CIPROFLOXACIN <=0.5 SENSITIVE Sensitive     ERYTHROMYCIN <=0.25 SENSITIVE Sensitive     GENTAMICIN <=0.5 SENSITIVE Sensitive     OXACILLIN 0.5 SENSITIVE Sensitive     TETRACYCLINE <=1 SENSITIVE Sensitive     VANCOMYCIN 1 SENSITIVE Sensitive     TRIMETH/SULFA <=10 SENSITIVE Sensitive     CLINDAMYCIN <=0.25 SENSITIVE Sensitive     RIFAMPIN <=0.5 SENSITIVE Sensitive     Inducible Clindamycin NEGATIVE Sensitive     * STAPHYLOCOCCUS AUREUS  Blood Culture ID Panel (Reflexed)     Status: Abnormal   Collection Time: 08/20/20  4:10 PM  Result Value Ref Range Status   Enterococcus faecalis NOT DETECTED NOT DETECTED Final   Enterococcus Faecium NOT DETECTED NOT DETECTED Final   Listeria monocytogenes NOT DETECTED NOT DETECTED Final   Staphylococcus species DETECTED (A) NOT DETECTED Final    Comment: CRITICAL RESULT CALLED TO, READ BACK BY AND VERIFIED WITH: PHRMD M LILLISON $[email or web address]$  08/21/20 BY S GEZAHEGN    Staphylococcus aureus (BCID) DETECTED (A) NOT DETECTED Final    Comment: CRITICAL RESULT  CALLED TO, READ BACK BY AND VERIFIED WITH: PHRMD M LILLISON $[email or web address]$  08/21/20 BY S GEZAHEGN    Staphylococcus epidermidis NOT DETECTED NOT DETECTED Final   Staphylococcus lugdunensis NOT DETECTED NOT DETECTED Final   Streptococcus species NOT DETECTED NOT DETECTED Final   Streptococcus agalactiae NOT DETECTED NOT DETECTED Final   Streptococcus pneumoniae NOT DETECTED NOT DETECTED Final   Streptococcus pyogenes NOT DETECTED NOT DETECTED Final   A.calcoaceticus-baumannii NOT DETECTED NOT DETECTED Final   Bacteroides fragilis NOT DETECTED NOT DETECTED Final   Enterobacterales NOT DETECTED NOT DETECTED Final   Enterobacter cloacae complex NOT DETECTED NOT DETECTED Final   Escherichia coli NOT DETECTED NOT DETECTED Final   Klebsiella aerogenes NOT DETECTED  NOT DETECTED Final   Klebsiella oxytoca NOT DETECTED NOT DETECTED Final   Klebsiella pneumoniae NOT DETECTED NOT DETECTED Final   Proteus species NOT DETECTED NOT DETECTED Final   Salmonella species NOT DETECTED NOT DETECTED Final   Serratia marcescens NOT DETECTED NOT DETECTED Final   Haemophilus influenzae NOT DETECTED NOT DETECTED Final   Neisseria meningitidis NOT DETECTED NOT DETECTED Final   Pseudomonas aeruginosa NOT DETECTED NOT DETECTED Final   Stenotrophomonas maltophilia NOT DETECTED NOT DETECTED Final   Candida albicans NOT DETECTED NOT DETECTED Final   Candida auris NOT DETECTED NOT DETECTED Final   Candida glabrata NOT DETECTED NOT DETECTED Final   Candida krusei NOT DETECTED NOT DETECTED Final   Candida parapsilosis NOT DETECTED NOT DETECTED Final   Candida tropicalis NOT DETECTED NOT DETECTED Final   Cryptococcus neoformans/gattii NOT DETECTED NOT DETECTED Final   Meth resistant mecA/C and MREJ NOT DETECTED NOT DETECTED Final    Comment: Performed at [facility], [address] [place], [place] [address]  Respiratory Panel by RT PCR (Flu A&B, Covid) - Nasopharyngeal Swab     Status: None   Collection Time: 08/20/20  4:16 PM   Specimen: Nasopharyngeal Swab  Result Value Ref Range Status   SARS Coronavirus 2 by RT PCR NEGATIVE NEGATIVE Final    Comment: (NOTE) SARS-CoV-2 target nucleic acids are NOT DETECTED.  The SARS-CoV-2 RNA is generally detectable in upper respiratoy specimens during the acute phase of infection. The lowest concentration of SARS-CoV-2 viral copies this assay can detect is 131 copies/mL. A negative result does not preclude SARS-Cov-2 infection and should not be used as the sole basis for treatment or other patient management decisions. A negative result may occur with  improper specimen collection/handling, submission of specimen other than nasopharyngeal swab, presence of viral mutation(s) within the areas targeted by this assay, and  inadequate number of viral copies (<131 copies/mL). A negative result must be combined with clinical observations, patient history, and epidemiological information. The expected result is Negative.  Fact Sheet for Patients:  PinkCheek.be  Fact Sheet for Healthcare Providers:  GravelBags.it  This test is no t yet approved or cleared by the [place] FDA and  has been authorized for detection and/or diagnosis of SARS-CoV-2 by FDA under an Emergency Use Authorization (EUA). This EUA will remain  in effect (meaning this test can be used) for the duration of the COVID-19 declaration under Section 564(b)(1) of the Act, 21 U.S.C. section 360bbb-3(b)(1), unless the authorization is terminated or revoked sooner.     Influenza A by PCR NEGATIVE NEGATIVE Final   Influenza B by PCR NEGATIVE NEGATIVE Final    Comment: (NOTE) The Xpert Xpress SARS-CoV-2/FLU/RSV assay is intended as an aid in  the diagnosis of influenza from Nasopharyngeal swab  specimens and  should not be used as a sole basis for treatment. Nasal washings and  aspirates are unacceptable for Xpert Xpress SARS-CoV-2/FLU/RSV  testing.  Fact Sheet for Patients: PinkCheek.be  Fact Sheet for Healthcare Providers: GravelBags.it  This test is not yet approved or cleared by the [place] FDA and  has been authorized for detection and/or diagnosis of SARS-CoV-2 by  FDA under an Emergency Use Authorization (EUA). This EUA will remain  in effect (meaning this test can be used) for the duration of the  Covid-19 declaration under Section 564(b)(1) of the Act, 21  U.S.C. section 360bbb-3(b)(1), unless the authorization is  terminated or revoked. Performed at [facility], [place] [place], [place] [address]   Urine culture     Status: Abnormal   Collection Time: 08/20/20  7:36 PM   Specimen:  Urine, Random  Result Value Ref Range Status   Specimen Description   Final    URINE, RANDOM Performed at [facility], [place] [place], [place] [address]    Special Requests   Final    NONE Performed at [facility], [address] [place], [place] [address]    Culture (A)  Final    60,000 COLONIES/mL ESCHERICHIA COLI 30,000 COLONIES/mL ENTEROCOCCUS FAECALIS    Report Status 08/23/2020 FINAL  Final   Organism ID, Bacteria ESCHERICHIA COLI (A)  Final   Organism ID, Bacteria ENTEROCOCCUS FAECALIS (A)  Final      Susceptibility   Escherichia coli - MIC*    AMPICILLIN >=32 RESISTANT Resistant     CEFAZOLIN <=4 SENSITIVE Sensitive     CEFEPIME <=0.12 SENSITIVE Sensitive     CEFTRIAXONE <=0.25 SENSITIVE Sensitive     CIPROFLOXACIN >=4 RESISTANT Resistant     GENTAMICIN <=1 SENSITIVE Sensitive     IMIPENEM <=0.25 SENSITIVE Sensitive     NITROFURANTOIN <=16 SENSITIVE Sensitive     TRIMETH/SULFA <=20 SENSITIVE Sensitive     AMPICILLIN/SULBACTAM >=32 RESISTANT Resistant     PIP/TAZO <=4 SENSITIVE Sensitive     * 60,000 COLONIES/mL ESCHERICHIA COLI   Enterococcus faecalis - MIC*    AMPICILLIN <=2 SENSITIVE Sensitive     NITROFURANTOIN <=16 SENSITIVE Sensitive     VANCOMYCIN 1 SENSITIVE Sensitive     * 30,000 COLONIES/mL ENTEROCOCCUS FAECALIS  Urine culture     Status: Abnormal   Collection Time: 08/21/20  8:14 AM   Specimen: In/Out Cath Urine  Result Value Ref Range Status   Specimen Description   Final    IN/OUT CATH URINE Performed at [facility], [place] [address] [place], [place] [address]    Special Requests   Final    URINE, RANDOM Performed at [place] [address] [place], [place] [address]    Culture (A)  Final    20,000 COLONIES/mL ESCHERICHIA COLI 20,000 COLONIES/mL ENTEROCOCCUS FAECALIS    Report Status 08/23/2020 FINAL  Final   Organism ID, Bacteria ESCHERICHIA COLI (A)  Final    Organism ID, Bacteria ENTEROCOCCUS FAECALIS (A)  Final      Susceptibility   Escherichia coli - MIC*    AMPICILLIN >=32 RESISTANT Resistant     CEFAZOLIN 16 SENSITIVE Sensitive     CEFEPIME <=0.12 SENSITIVE Sensitive     CEFTRIAXONE <=0.25 SENSITIVE Sensitive     CIPROFLOXACIN >=4 RESISTANT Resistant     GENTAMICIN <=1 SENSITIVE Sensitive     IMIPENEM <=0.25 SENSITIVE Sensitive  NITROFURANTOIN <=16 SENSITIVE Sensitive     TRIMETH/SULFA <=20 SENSITIVE Sensitive     AMPICILLIN/SULBACTAM >=32 RESISTANT Resistant     PIP/TAZO <=4 SENSITIVE Sensitive     * 20,000 COLONIES/mL ESCHERICHIA COLI   Enterococcus faecalis - MIC*    AMPICILLIN <=2 SENSITIVE Sensitive     NITROFURANTOIN <=16 SENSITIVE Sensitive     VANCOMYCIN 1 SENSITIVE Sensitive     * 20,000 COLONIES/mL ENTEROCOCCUS FAECALIS  Blood culture (routine x 2)     Status: Abnormal   Collection Time: 08/21/20  8:14 AM   Specimen: BLOOD RIGHT ARM  Result Value Ref Range Status   Specimen Description   Final    BLOOD RIGHT ARM Performed at [facility], [place] [address] [place], [place] [address]    Special Requests   Final    BOTTLES DRAWN AEROBIC ONLY Blood Culture results may not be optimal due to an inadequate volume of blood received in culture bottles Performed at [place] [address], [place], [place] [address]    Culture  Setup Time   Final    GRAM POSITIVE COCCI IN CLUSTERS AEROBIC BOTTLE ONLY Organism ID to follow CRITICAL RESULT CALLED TO, READ BACK BY AND VERIFIED WITH: Barth Kirks [facility] 9381 08/22/20 A BROWNING Performed at [facility], [address], [place], [place] [address]    Culture STAPHYLOCOCCUS AUREUS (A)  Final   Report Status 08/24/2020 FINAL  Final   Organism ID, Bacteria STAPHYLOCOCCUS AUREUS  Final      Susceptibility   Staphylococcus aureus - MIC*    CIPROFLOXACIN <=0.5 SENSITIVE Sensitive     ERYTHROMYCIN <=0.25 SENSITIVE Sensitive     GENTAMICIN <=0.5  SENSITIVE Sensitive     OXACILLIN <=0.25 SENSITIVE Sensitive     TETRACYCLINE <=1 SENSITIVE Sensitive     VANCOMYCIN 1 SENSITIVE Sensitive     TRIMETH/SULFA <=10 SENSITIVE Sensitive     CLINDAMYCIN <=0.25 SENSITIVE Sensitive     RIFAMPIN <=0.5 SENSITIVE Sensitive     Inducible Clindamycin NEGATIVE Sensitive     * STAPHYLOCOCCUS AUREUS  Blood Culture ID Panel (Reflexed)     Status: Abnormal   Collection Time: 08/21/20  8:14 AM  Result Value Ref Range Status   Enterococcus faecalis NOT DETECTED NOT DETECTED Final   Enterococcus Faecium NOT DETECTED NOT DETECTED Final   Listeria monocytogenes NOT DETECTED NOT DETECTED Final   Staphylococcus species DETECTED (A) NOT DETECTED Final    Comment: CRITICAL RESULT CALLED TO, READ BACK BY AND VERIFIED WITH: Barth Kirks PHARMD 1911 08/22/20 A BROWNING    Staphylococcus aureus (BCID) DETECTED (A) NOT DETECTED Final    Comment: CRITICAL RESULT CALLED TO, READ BACK BY AND VERIFIED WITH: Barth Kirks PHARMD 1911 08/22/20 A BROWNING    Staphylococcus epidermidis NOT DETECTED NOT DETECTED Final   Staphylococcus lugdunensis NOT DETECTED NOT DETECTED Final   Streptococcus species NOT DETECTED NOT DETECTED Final   Streptococcus agalactiae NOT DETECTED NOT DETECTED Final   Streptococcus pneumoniae NOT DETECTED NOT DETECTED Final   Streptococcus pyogenes NOT DETECTED NOT DETECTED Final   A.calcoaceticus-baumannii NOT DETECTED NOT DETECTED Final   Bacteroides fragilis NOT DETECTED NOT DETECTED Final   Enterobacterales NOT DETECTED NOT DETECTED Final   Enterobacter cloacae complex NOT DETECTED NOT DETECTED Final   Escherichia coli NOT DETECTED NOT DETECTED Final   Klebsiella aerogenes NOT DETECTED NOT DETECTED Final   Klebsiella oxytoca NOT DETECTED NOT DETECTED Final   Klebsiella pneumoniae NOT DETECTED NOT DETECTED Final  Proteus species NOT DETECTED NOT DETECTED Final   Salmonella species NOT DETECTED NOT DETECTED Final   Serratia marcescens NOT  DETECTED NOT DETECTED Final   Haemophilus influenzae NOT DETECTED NOT DETECTED Final   Neisseria meningitidis NOT DETECTED NOT DETECTED Final   Pseudomonas aeruginosa NOT DETECTED NOT DETECTED Final   Stenotrophomonas maltophilia NOT DETECTED NOT DETECTED Final   Candida albicans NOT DETECTED NOT DETECTED Final   Candida auris NOT DETECTED NOT DETECTED Final   Candida glabrata NOT DETECTED NOT DETECTED Final   Candida krusei NOT DETECTED NOT DETECTED Final   Candida parapsilosis NOT DETECTED NOT DETECTED Final   Candida tropicalis NOT DETECTED NOT DETECTED Final   Cryptococcus neoformans/gattii NOT DETECTED NOT DETECTED Final   Meth resistant mecA/C and MREJ NOT DETECTED NOT DETECTED Final    Comment: Performed at [facility], [address], [place], [place] [address]  Respiratory Panel by RT PCR (Flu A&B, Covid) - Nasopharyngeal Swab     Status: None   Collection Time: 08/21/20  8:27 AM   Specimen: Nasopharyngeal Swab  Result Value Ref Range Status   SARS Coronavirus 2 by RT PCR NEGATIVE NEGATIVE Final    Comment: (NOTE) SARS-CoV-2 target nucleic acids are NOT DETECTED.  The SARS-CoV-2 RNA is generally detectable in upper respiratoy specimens during the acute phase of infection. The lowest concentration of SARS-CoV-2 viral copies this assay can detect is 131 copies/mL. A negative result does not preclude SARS-Cov-2 infection and should not be used as the sole basis for treatment or other patient management decisions. A negative result may occur with  improper specimen collection/handling, submission of specimen other than nasopharyngeal swab, presence of viral mutation(s) within the areas targeted by this assay, and inadequate number of viral copies (<131 copies/mL). A negative result must be combined with clinical observations, patient history, and epidemiological information. The expected result is Negative.  Fact Sheet for Patients:   PinkCheek.be  Fact Sheet for Healthcare Providers:  GravelBags.it  This test is no t yet approved or cleared by the [place] FDA and  has been authorized for detection and/or diagnosis of SARS-CoV-2 by FDA under an Emergency Use Authorization (EUA). This EUA will remain  in effect (meaning this test can be used) for the duration of the COVID-19 declaration under Section 564(b)(1) of the Act, 21 U.S.C. section 360bbb-3(b)(1), unless the authorization is terminated or revoked sooner.     Influenza A by PCR NEGATIVE NEGATIVE Final   Influenza B by PCR NEGATIVE NEGATIVE Final    Comment: (NOTE) The Xpert Xpress SARS-CoV-2/FLU/RSV assay is intended as an aid in  the diagnosis of influenza from Nasopharyngeal swab specimens and  should not be used as a sole basis for treatment. Nasal washings and  aspirates are unacceptable for Xpert Xpress SARS-CoV-2/FLU/RSV  testing.  Fact Sheet for Patients: PinkCheek.be  Fact Sheet for Healthcare Providers: GravelBags.it  This test is not yet approved or cleared by the [place] FDA and  has been authorized for detection and/or diagnosis of SARS-CoV-2 by  FDA under an Emergency Use Authorization (EUA). This EUA will remain  in effect (meaning this test can be used) for the duration of the  Covid-19 declaration under Section 564(b)(1) of the Act, 21  U.S.C. section 360bbb-3(b)(1), unless the authorization is  terminated or revoked. Performed at [facility], [place] [address] [place], [place] [address]   Culture, blood (routine x 2)     Status: None (Preliminary result)   Collection  Time: 08/23/20  7:20 AM   Specimen: BLOOD RIGHT HAND  Result Value Ref Range Status   Specimen Description   Final    BLOOD RIGHT HAND Performed at [facility] [address] [place], [place]  [address]    Special Requests   Final    BOTTLES DRAWN AEROBIC ONLY Blood Culture adequate volume Performed at [place] [address], [place], [place] [address]    Culture   Final    NO GROWTH 1 DAY Performed at [facility], [place] [address], [place], [place] [address]    Report Status PENDING  Incomplete  Culture, blood (routine x 2)     Status: None (Preliminary result)   Collection Time: 08/23/20  7:36 AM   Specimen: BLOOD RIGHT HAND  Result Value Ref Range Status   Specimen Description   Final    BLOOD RIGHT HAND Performed at [place] [address], [place], [place] [address]    Special Requests   Final    BOTTLES DRAWN AEROBIC ONLY Blood Culture adequate volume Performed at [place] [address] [place], [place] [address]    Culture   Final    NO GROWTH 1 DAY Performed at [facility], [place] [address], [place], [place] [address]    Report Status PENDING  Incomplete    Impression/Plan:  1. picc line infection - repeat blood cultures ngtd.  On cefazolin and no changes. Plan for 4 weeks from negative blood culture.    2.  Access - will need new line place once blood cultures remain negative at 72 hours.

## 2020-08-24 NOTE — Plan of Care (Signed)
°  Problem: Clinical Measurements: Goal: Will remain free from infection Outcome: Progressing Goal: Diagnostic test results will improve Outcome: Progressing Goal: Respiratory complications will improve Outcome: Progressing Goal: Cardiovascular complication will be avoided Outcome: Progressing   Problem: Elimination: Goal: Will not experience complications related to bowel motility Outcome: Progressing Goal: Will not experience complications related to urinary retention Outcome: Progressing   Problem: Safety: Goal: Ability to remain free from injury will improve Outcome: Progressing

## 2020-08-25 LAB — GLUCOSE, CAPILLARY
Glucose-Capillary: 100 mg/dL — ABNORMAL HIGH (ref 70–99)
Glucose-Capillary: 105 mg/dL — ABNORMAL HIGH (ref 70–99)
Glucose-Capillary: 106 mg/dL — ABNORMAL HIGH (ref 70–99)
Glucose-Capillary: 113 mg/dL — ABNORMAL HIGH (ref 70–99)
Glucose-Capillary: 114 mg/dL — ABNORMAL HIGH (ref 70–99)
Glucose-Capillary: 115 mg/dL — ABNORMAL HIGH (ref 70–99)
Glucose-Capillary: 96 mg/dL (ref 70–99)

## 2020-08-25 LAB — BASIC METABOLIC PANEL
Anion gap: 9 (ref 5–15)
BUN: 7 mg/dL (ref 6–20)
CO2: 25 mmol/L (ref 22–32)
Calcium: 8.5 mg/dL — ABNORMAL LOW (ref 8.9–10.3)
Chloride: 100 mmol/L (ref 98–111)
Creatinine, Ser: <0.3 mg/dL — ABNORMAL LOW (ref 0.61–1.24)
Glucose, Bld: 102 mg/dL — ABNORMAL HIGH (ref 70–99)
Potassium: 4.2 mmol/L (ref 3.5–5.1)
Sodium: 134 mmol/L — ABNORMAL LOW (ref 135–145)

## 2020-08-25 NOTE — Progress Notes (Signed)
SLP  Note  Patient Details Name: [name] MRN: [id number] DOB: [birth year]   DC:       Reason Eval/Treat Not Completed: Other (comment) (ST to sign off, family bringing pt nutrition and note potential feeding tube in future)  Father and son educated to dysphagia compensation on prior exam.  No follow up. Thanks.   Kathleen Lime, MS [facility] SLP Acute Rehab Services Office [phone] Pager [phone]   Macario Golds 08/25/2020, 10:32 AM

## 2020-08-25 NOTE — Progress Notes (Signed)
PHARMACY NOTE -  Ancef  Pharmacy has been assisting with dosing of Ancef for MSSA bacteremia.  Dosage remains stable at 2g IV q8 hr and need for further dosage adjustment appears unlikely at present given baseline renal function  Pharmacy will sign off, following peripherally for culture results or dose adjustments. Please reconsult if a change in clinical status warrants re-evaluation of dosage.  Reuel Boom, PharmD, BCPS [phone] 08/25/2020, 9:10 AM

## 2020-08-25 NOTE — Progress Notes (Signed)
[facility] PROGRESS NOTE    Progress Note  Peter Hayes  XID:[id number] DOB: 01-10-1996 DOA: 08/21/2020 PCP: Penni Bombard, PA     Brief Narrative:   Peter Hayes is an 24 y.o. 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 on TPN through PICC line chronically and recurrent pneumonias who came back to the ED on 08/21/2020, is brought into the ED for confusion today prior to admission he was found to have a fever and a chest x-ray showing possible pneumonia saturations remained stable blood cultures were ordered, on the day of admission he was brought in with worsening confusion and blood cultures were positive for staph aureus from blood drawn from the PICC line was started on IV Ancef  Assessment/Plan:   Severe sepsis due to MSSA bacteremia: Blood cultures on 11/14 was positive for staph, surveillance blood culture on 16 November have remained negative till date. PICC line was discontinued will need a line holiday. TTE showed the presence of a PICC line into the right atrium no vegetations noted. ID was consulted and recommended IV cefazolin for 4 weeks from negative surveillance blood cultures. PICC line to be placed on 08/27/2020.  Bronchopneumonia: He is at high risk of aspiration, currently on IV Ancef has remained afebrile leukopenia has improved.  Left pneumothorax: Repeated chest x-ray showed a very small pneumothorax, I have personally reviewed the chest x-ray and it shows fluid in the fissure.  He was given a single dose of IV Lasix yesterday, is negative about 3 L panel is pending this morning.  Acute metabolic encephalopathy: Likely due to infectious etiology plus minus hypoglycemia.  Hypoglycemia: On CBGs every 4 blood glucose fairly controlled.  On D10 half-normal saline to prevent hypoglycemia  Hypokalemia: Repleted orally now resolved.  We will keep in close eye  as we are diuresing him.  Chronic respiratory failure with hypoxia and hypercarbia: BiPAP dependent on 2 L of oxygen.  Right pleural effusion: There is currently not exacerbating his shortness of breath we will continue to monitor.  Elevated LFTs: Abdominal ultrasound unremarkable CT scan no acute findings now improving.  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  Severe protein caloric malnutrition: Continue TPN once central line can be reinserted.  The family is more amenable to a J-tube, they will follow-up with GI/surgery as an outpatient to discuss risk and benefits.  Duchenne's muscular dystrophy: Noted.  Sinus tach: Continue metoprolol.  Hypotension: Appears to be a chronic issue currently on midodrine.  Pancytopenia: His leukopenia is improving.  Normocytic anemia: Suspect due to malnutrition.  He is a Restaurant manager, fast food.   Sacral decubitus ulcer stage II present on admission: RN Pressure Injury Documentation: Pressure Injury 09/16/19 Nose Upper Stage II -  Partial thickness loss of dermis presenting as a shallow open ulcer with a red, pink wound bed without slough. Exposed cartiledge from [place] at home (Active)  09/16/19 1000  Location: Nose  Location Orientation: Upper  Staging: Stage II -  Partial thickness loss of dermis presenting as a shallow open ulcer with a red, pink wound bed without slough.  Wound Description (Comments): Exposed cartiledge from Bipap mask at home  Present on Admission: Yes      DVT prophylaxis: lovenox Family Communication:father Status is: Inpatient  Remains inpatient appropriate because:Hemodynamically unstable   Dispo: The patient is from: Home  Anticipated d/c is to: Home              Anticipated d/c date is: > 3 days              Patient currently is not medically stable to d/c.     Code Status:     Code Status Orders   (From admission, onward)         Start     Ordered   08/21/20 1223  Full code  Continuous        08/21/20 1222        Code Status History    Date Active Date Inactive Code Status Order ID Comments User Context   06/14/2020 2131 06/21/2020 1941 Full Code [id number]  Lenore Cordia, MD ED   02/07/2020 0323 02/08/2020 2204 Full Code [id number]  Chauncey Mann, MD Inpatient   02/06/2020 1954 02/07/2020 0323 Full Code [id number]  Arlan Organ, DO ED   09/16/2019 1134 09/21/2019 1815 Full Code [id number]  Truddie Hidden, MD Inpatient   09/16/2019 0458 09/16/2019 1134 Full Code [id number]  Etta Quill, DO ED   08/08/2018 0303 09/02/2018 1741 DNR [id number]  Gordan Payment, CNA Inpatient   05/16/2018 0549 05/20/2018 1727 Full Code [id number]  Reyne Dumas, MD ED   01/10/2018 0614 01/15/2018 1719 Full Code [id number]  Stark Klein, MD ED   12/01/2017 2110 12/04/2017 2005 Full Code [id number]  Etta Quill, DO Inpatient   09/24/2017 0237 09/25/2017 2117 Full Code [id number]  Vianne Bulls, MD Inpatient   05/05/2016 0715 05/11/2016 1837 Full Code [id number]  Maren Reamer, MD Inpatient   Advance Care Planning Activity        IV Access:    Peripheral IV   Procedures and diagnostic studies:   DG CHEST PORT 1 VIEW  Result Date: 08/24/2020 CLINICAL DATA:  Pneumothorax, pleural effusion EXAM: PORTABLE CHEST 1 VIEW COMPARISON:  08/23/2020 FINDINGS: Lung volumes are small, but are symmetric. Wedge like opacity within the right mid lung zone likely represents fluid within the fissure and is unchanged. Tiny left apical pneumothorax is again identified, slightly decreased in size since prior examination. No superimposed focal pulmonary infiltrate. No pneumothorax or dependent pleural effusion. Cardiac size within normal limits. Pulmonary vascularity is normal. IMPRESSION: Tiny left apical pneumothorax, decreased. Stable wedge like opacity within the right mid lung zone likely representing fluid within the  fissure. Electronically Signed   By: Fidela Salisbury MD   On: 08/24/2020 05:51     Medical Consultants:    None.  Anti-Infectives:   Ancef  Subjective:    Peter Hayes no events overnight no new complaints this morning.  Objective:    Vitals:   08/24/20 1319 08/24/20 1400 08/24/20 2107 08/25/20 0421  BP:   114/78 101/66  Pulse:  79 98   Resp:  12 (!) 32 (!) 21  Temp: (!) 97.4 F (36.3 C)  98 F (36.7 C) 97.6 F (36.4 C)  TempSrc: Axillary  Axillary Axillary  SpO2:  100% 98%   Weight:      Height:       SpO2: 98 % O2 Flow Rate (L/min): 99 L/min FiO2 (%):  (2l)   Intake/Output Summary (Last 24 hours) at 08/25/2020 0702 Last data filed at 08/24/2020 1706 Gross per 24 hour  Intake --  Output 700 ml  Net -700 ml   Filed Weights   08/21/20 0631 08/22/20 0500 08/24/20 0500  Weight: 40.8 kg 40.8 kg 41.4  kg    Exam: General exam: In no acute distress. Respiratory system: Good air movement and clear to auscultation. Cardiovascular system: S1 & S2 heard, RRR. No JVD. Gastrointestinal system: Abdomen is nondistended, soft and nontender.  Extremities: No pedal edema. Skin: No rashes, lesions or ulcers  Data Reviewed:    Labs: Basic Metabolic Panel: Recent Labs  Lab 08/21/20 0814 08/21/20 0922 08/21/20 1305 08/21/20 1305 08/22/20 0413 08/22/20 0413 08/22/20 1627 08/22/20 1627 08/23/20 0424 08/24/20 0433  NA   < >  --  134*  --  135  --  132*  --  133* 135  K   < >  --  2.8*   < > 2.2*   < > 4.2   < > 3.8 4.1  CL   < >  --  102  --  101  --  99  --  100 105  CO2   < >  --  25  --  25  --  26  --  26 24  GLUCOSE   < >  --  69*  --  109*  --  132*  --  101* 96  BUN   < >  --  13  --  12  --  7  --  <5* 7  CREATININE   < >  --  <0.30*  --  <0.30*  --  <0.30*  --  <0.30* <0.30*  CALCIUM   < >  --  8.4*  --  8.3*  --  8.3*  --  8.3* 8.3*  MG  --  1.8  --   --   --   --   --   --   --   --    < > = values in this interval not displayed.   GFR CrCl  cannot be calculated (This lab value cannot be used to calculate CrCl because it is not a number: <0.30). Liver Function Tests: Recent Labs  Lab 08/20/20 1616 08/21/20 0814 08/22/20 0413 08/23/20 0424 08/24/20 0433  AST 110* 1,897* 621* 233* 111*  ALT 173* 1,573* 802* 506* 278*  ALKPHOS 163* 292* 224* 190* 171*  BILITOT 1.2 3.0* 1.3* 0.8 0.7  PROT 8.5* 8.5* 6.0* 6.5 6.1*  ALBUMIN 4.4 4.2 3.1* 3.3* 3.1*   Recent Labs  Lab 08/20/20 1616 08/21/20 0922  LIPASE 27 21   No results for input(s): AMMONIA in the last 168 hours. Coagulation profile Recent Labs  Lab 08/21/20 0814 08/22/20 0413 08/23/20 0424  INR 1.7* 2.2* 1.1   COVID-19 Labs  Recent Labs    08/23/20 0424  FERRITIN 341*    Lab Results  Component Value Date   SARSCOV2NAA NEGATIVE 08/21/2020   SARSCOV2NAA NEGATIVE 08/20/2020   SARSCOV2NAA NEGATIVE 06/14/2020   [place] NEGATIVE 02/06/2020    CBC: Recent Labs  Lab 08/20/20 1616 08/21/20 0814 08/22/20 0413 08/23/20 0424 08/24/20 0433  WBC 7.2 7.6 2.8* 3.2* 4.4  NEUTROABS 6.3 7.3  --  1.4* 1.6*  HGB 12.6* 13.0 10.8* 10.8* 11.2*  HCT 39.6 41.0 33.6* 33.3* 35.5*  MCV 82.8 84.7 82.6 82.8 84.1  PLT 105* 95* 72* 87* 98*   Cardiac Enzymes: No results for input(s): CKTOTAL, CKMB, CKMBINDEX, TROPONINI in the last 168 hours. BNP (last 3 results) No results for input(s): PROBNP in the last 8760 hours. CBG: Recent Labs  Lab 08/24/20 1105 08/24/20 1318 08/24/20 1514 08/25/20 0003 08/25/20 0416  GLUCAP 95 133* 116* 113* 96   D-Dimer: No results  for input(s): DDIMER in the last 72 hours. Hgb A1c: No results for input(s): HGBA1C in the last 72 hours. Lipid Profile: No results for input(s): CHOL, HDL, LDLCALC, TRIG, CHOLHDL, LDLDIRECT in the last 72 hours. Thyroid function studies: No results for input(s): TSH, T4TOTAL, T3FREE, THYROIDAB in the last 72 hours.  Invalid input(s): FREET3 Anemia work up: Recent Labs    08/23/20 0424  VITAMINB12  2,030*  FOLATE 21.5  FERRITIN 341*  TIBC 247*  IRON 19*  RETICCTPCT 0.6   Sepsis Labs: Recent Labs  Lab 08/20/20 1616 08/20/20 1616 08/20/20 1848 08/21/20 0814 08/21/20 1724 08/22/20 0413 08/23/20 0424 08/24/20 0433  PROCALCITON  --   --   --   --   --  33.84  --   --   WBC 7.2   < >  --  7.6  --  2.8* 3.2* 4.4  LATICACIDVEN 0.6  --  0.6 1.3 1.6  --   --   --    < > = values in this interval not displayed.   Microbiology Recent Results (from the past 240 hour(s))  Culture, blood (routine x 2)     Status: Abnormal   Collection Time: 08/20/20  4:10 PM   Specimen: BLOOD LEFT ARM  Result Value Ref Range Status   Specimen Description   Final    BLOOD LEFT ARM UPPER PICC Performed at [facility], [address] [place], [place] [address]    Special Requests   Final    BOTTLES DRAWN AEROBIC AND ANAEROBIC Blood Culture adequate volume Performed at [facility], [address] [place], [place] [address]    Culture  Setup Time   Final    GRAM POSITIVE COCCI IN BOTH AEROBIC AND ANAEROBIC BOTTLES Organism ID to follow CRITICAL RESULT CALLED TO, READ BACK BY AND VERIFIED WITH: PHRMD M LILLISON $[email or web address]$  08/21/20 BY S GEZAHEGN Performed at [facility], [place] [address] [place], [place] [address]    Culture STAPHYLOCOCCUS AUREUS (A)  Final   Report Status 08/23/2020 FINAL  Final   Organism ID, Bacteria STAPHYLOCOCCUS AUREUS  Final      Susceptibility   Staphylococcus aureus - MIC*    CIPROFLOXACIN <=0.5 SENSITIVE Sensitive     ERYTHROMYCIN <=0.25 SENSITIVE Sensitive     GENTAMICIN <=0.5 SENSITIVE Sensitive     OXACILLIN 0.5 SENSITIVE Sensitive     TETRACYCLINE <=1 SENSITIVE Sensitive     VANCOMYCIN 1 SENSITIVE Sensitive     TRIMETH/SULFA <=10 SENSITIVE Sensitive     CLINDAMYCIN <=0.25 SENSITIVE Sensitive     RIFAMPIN <=0.5 SENSITIVE Sensitive     Inducible Clindamycin NEGATIVE Sensitive     * STAPHYLOCOCCUS AUREUS  Blood Culture ID Panel  (Reflexed)     Status: Abnormal   Collection Time: 08/20/20  4:10 PM  Result Value Ref Range Status   Enterococcus faecalis NOT DETECTED NOT DETECTED Final   Enterococcus Faecium NOT DETECTED NOT DETECTED Final   Listeria monocytogenes NOT DETECTED NOT DETECTED Final   Staphylococcus species DETECTED (A) NOT DETECTED Final    Comment: CRITICAL RESULT CALLED TO, READ BACK BY AND VERIFIED WITH: PHRMD M LILLISON $[email or web address]'[email or web address]'[email or web address]$  08/21/20 BY S GEZAHEGN    Staphylococcus aureus (BCID) DETECTED (A) NOT DETECTED Final    Comment: CRITICAL RESULT CALLED TO, READ BACK BY AND VERIFIED WITH: PHRMD M LILLISON $[email or web address]'[email or web address]'[email or web address]$  08/21/20 BY S GEZAHEGN    Staphylococcus epidermidis NOT DETECTED NOT DETECTED Final   Staphylococcus lugdunensis NOT DETECTED  NOT DETECTED Final   Streptococcus species NOT DETECTED NOT DETECTED Final   Streptococcus agalactiae NOT DETECTED NOT DETECTED Final   Streptococcus pneumoniae NOT DETECTED NOT DETECTED Final   Streptococcus pyogenes NOT DETECTED NOT DETECTED Final   A.calcoaceticus-baumannii NOT DETECTED NOT DETECTED Final   Bacteroides fragilis NOT DETECTED NOT DETECTED Final   Enterobacterales NOT DETECTED NOT DETECTED Final   Enterobacter cloacae complex NOT DETECTED NOT DETECTED Final   Escherichia coli NOT DETECTED NOT DETECTED Final   Klebsiella aerogenes NOT DETECTED NOT DETECTED Final   Klebsiella oxytoca NOT DETECTED NOT DETECTED Final   Klebsiella pneumoniae NOT DETECTED NOT DETECTED Final   Proteus species NOT DETECTED NOT DETECTED Final   Salmonella species NOT DETECTED NOT DETECTED Final   Serratia marcescens NOT DETECTED NOT DETECTED Final   Haemophilus influenzae NOT DETECTED NOT DETECTED Final   Neisseria meningitidis NOT DETECTED NOT DETECTED Final   Pseudomonas aeruginosa NOT DETECTED NOT DETECTED Final   Stenotrophomonas maltophilia NOT DETECTED NOT DETECTED Final   Candida albicans NOT DETECTED NOT DETECTED Final   Candida auris NOT DETECTED NOT DETECTED Final    Candida glabrata NOT DETECTED NOT DETECTED Final   Candida krusei NOT DETECTED NOT DETECTED Final   Candida parapsilosis NOT DETECTED NOT DETECTED Final   Candida tropicalis NOT DETECTED NOT DETECTED Final   Cryptococcus neoformans/gattii NOT DETECTED NOT DETECTED Final   Meth resistant mecA/C and MREJ NOT DETECTED NOT DETECTED Final    Comment: Performed at [facility], [address], [place], [place] [address]  Respiratory Panel by RT PCR (Flu A&B, Covid) - Nasopharyngeal Swab     Status: None   Collection Time: 08/20/20  4:16 PM   Specimen: Nasopharyngeal Swab  Result Value Ref Range Status   SARS Coronavirus 2 by RT PCR NEGATIVE NEGATIVE Final    Comment: (NOTE) SARS-CoV-2 target nucleic acids are NOT DETECTED.  The SARS-CoV-2 RNA is generally detectable in upper respiratoy specimens during the acute phase of infection. The lowest concentration of SARS-CoV-2 viral copies this assay can detect is 131 copies/mL. A negative result does not preclude SARS-Cov-2 infection and should not be used as the sole basis for treatment or other patient management decisions. A negative result may occur with  improper specimen collection/handling, submission of specimen other than nasopharyngeal swab, presence of viral mutation(s) within the areas targeted by this assay, and inadequate number of viral copies (<131 copies/mL). A negative result must be combined with clinical observations, patient history, and epidemiological information. The expected result is Negative.  Fact Sheet for Patients:  PinkCheek.be  Fact Sheet for Healthcare Providers:  GravelBags.it  This test is no t yet approved or cleared by the [place] FDA and  has been authorized for detection and/or diagnosis of SARS-CoV-2 by FDA under an Emergency Use Authorization (EUA). This EUA will remain  in effect (meaning this test can be used) for the duration of  the COVID-19 declaration under Section 564(b)(1) of the Act, 21 U.S.C. section 360bbb-3(b)(1), unless the authorization is terminated or revoked sooner.     Influenza A by PCR NEGATIVE NEGATIVE Final   Influenza B by PCR NEGATIVE NEGATIVE Final    Comment: (NOTE) The Xpert Xpress SARS-CoV-2/FLU/RSV assay is intended as an aid in  the diagnosis of influenza from Nasopharyngeal swab specimens and  should not be used as a sole basis for treatment. Nasal washings and  aspirates are unacceptable for Xpert Xpress SARS-CoV-2/FLU/RSV  testing.  Fact Sheet for Patients: PinkCheek.be  Fact Sheet for Healthcare Providers: GravelBags.it  This test is not yet approved or cleared by the [place] FDA and  has been authorized for detection and/or diagnosis of SARS-CoV-2 by  FDA under an Emergency Use Authorization (EUA). This EUA will remain  in effect (meaning this test can be used) for the duration of the  Covid-19 declaration under Section 564(b)(1) of the Act, 21  U.S.C. section 360bbb-3(b)(1), unless the authorization is  terminated or revoked. Performed at [facility], [address] [place], [place] [address]   Urine culture     Status: Abnormal   Collection Time: 08/20/20  7:36 PM   Specimen: Urine, Random  Result Value Ref Range Status   Specimen Description   Final    URINE, RANDOM Performed at [facility], [address] [place], [place] [address]    Special Requests   Final    NONE Performed at [facility], [address] [place], [place] [address]    Culture (A)  Final    60,000 COLONIES/mL ESCHERICHIA COLI 30,000 COLONIES/mL ENTEROCOCCUS FAECALIS    Report Status 08/23/2020 FINAL  Final   Organism ID, Bacteria ESCHERICHIA COLI (A)  Final   Organism ID, Bacteria ENTEROCOCCUS FAECALIS (A)  Final      Susceptibility   Escherichia coli - MIC*    AMPICILLIN >=32 RESISTANT  Resistant     CEFAZOLIN <=4 SENSITIVE Sensitive     CEFEPIME <=0.12 SENSITIVE Sensitive     CEFTRIAXONE <=0.25 SENSITIVE Sensitive     CIPROFLOXACIN >=4 RESISTANT Resistant     GENTAMICIN <=1 SENSITIVE Sensitive     IMIPENEM <=0.25 SENSITIVE Sensitive     NITROFURANTOIN <=16 SENSITIVE Sensitive     TRIMETH/SULFA <=20 SENSITIVE Sensitive     AMPICILLIN/SULBACTAM >=32 RESISTANT Resistant     PIP/TAZO <=4 SENSITIVE Sensitive     * 60,000 COLONIES/mL ESCHERICHIA COLI   Enterococcus faecalis - MIC*    AMPICILLIN <=2 SENSITIVE Sensitive     NITROFURANTOIN <=16 SENSITIVE Sensitive     VANCOMYCIN 1 SENSITIVE Sensitive     * 30,000 COLONIES/mL ENTEROCOCCUS FAECALIS  Urine culture     Status: Abnormal   Collection Time: 08/21/20  8:14 AM   Specimen: In/Out Cath Urine  Result Value Ref Range Status   Specimen Description   Final    IN/OUT CATH URINE Performed at [facility], [place] [address] [place], [place] [address]    Special Requests   Final    URINE, RANDOM Performed at [place] [address] [place],  [address]    Culture (A)  Final    20,000 COLONIES/mL ESCHERICHIA COLI 20,000 COLONIES/mL ENTEROCOCCUS FAECALIS    Report Status 08/23/2020 FINAL  Final   Organism ID, Bacteria ESCHERICHIA COLI (A)  Final   Organism ID, Bacteria ENTEROCOCCUS FAECALIS (A)  Final      Susceptibility   Escherichia coli - MIC*    AMPICILLIN >=32 RESISTANT Resistant     CEFAZOLIN 16 SENSITIVE Sensitive     CEFEPIME <=0.12 SENSITIVE Sensitive     CEFTRIAXONE <=0.25 SENSITIVE Sensitive     CIPROFLOXACIN >=4 RESISTANT Resistant     GENTAMICIN <=1 SENSITIVE Sensitive     IMIPENEM <=0.25 SENSITIVE Sensitive     NITROFURANTOIN <=16 SENSITIVE Sensitive     TRIMETH/SULFA <=20 SENSITIVE Sensitive     AMPICILLIN/SULBACTAM >=32 RESISTANT Resistant     PIP/TAZO <=4 SENSITIVE Sensitive     *  20,000 COLONIES/mL ESCHERICHIA COLI   Enterococcus faecalis - MIC*     AMPICILLIN <=2 SENSITIVE Sensitive     NITROFURANTOIN <=16 SENSITIVE Sensitive     VANCOMYCIN 1 SENSITIVE Sensitive     * 20,000 COLONIES/mL ENTEROCOCCUS FAECALIS  Blood culture (routine x 2)     Status: Abnormal   Collection Time: 08/21/20  8:14 AM   Specimen: BLOOD RIGHT ARM  Result Value Ref Range Status   Specimen Description   Final    BLOOD RIGHT ARM Performed at [facility], [place] [address], [place], [place] [address]    Special Requests   Final    BOTTLES DRAWN AEROBIC ONLY Blood Culture results may not be optimal due to an inadequate volume of blood received in culture bottles Performed at [place] [address], [place], [place] [address]    Culture  Setup Time   Final    GRAM POSITIVE COCCI IN CLUSTERS AEROBIC BOTTLE ONLY Organism ID to follow CRITICAL RESULT CALLED TO, READ BACK BY AND VERIFIED WITH: Barth Kirks [facility] 8295 08/22/20 A BROWNING Performed at [facility], [address], [place], [place] [address]    Culture STAPHYLOCOCCUS AUREUS (A)  Final   Report Status 08/24/2020 FINAL  Final   Organism ID, Bacteria STAPHYLOCOCCUS AUREUS  Final      Susceptibility   Staphylococcus aureus - MIC*    CIPROFLOXACIN <=0.5 SENSITIVE Sensitive     ERYTHROMYCIN <=0.25 SENSITIVE Sensitive     GENTAMICIN <=0.5 SENSITIVE Sensitive     OXACILLIN <=0.25 SENSITIVE Sensitive     TETRACYCLINE <=1 SENSITIVE Sensitive     VANCOMYCIN 1 SENSITIVE Sensitive     TRIMETH/SULFA <=10 SENSITIVE Sensitive     CLINDAMYCIN <=0.25 SENSITIVE Sensitive     RIFAMPIN <=0.5 SENSITIVE Sensitive     Inducible Clindamycin NEGATIVE Sensitive     * STAPHYLOCOCCUS AUREUS  Blood Culture ID Panel (Reflexed)     Status: Abnormal   Collection Time: 08/21/20  8:14 AM  Result Value Ref Range Status   Enterococcus faecalis NOT DETECTED NOT DETECTED Final   Enterococcus Faecium NOT DETECTED NOT DETECTED Final   Listeria monocytogenes NOT DETECTED NOT DETECTED Final    Staphylococcus species DETECTED (A) NOT DETECTED Final    Comment: CRITICAL RESULT CALLED TO, READ BACK BY AND VERIFIED WITH: Barth Kirks PHARMD 1911 08/22/20 A BROWNING    Staphylococcus aureus (BCID) DETECTED (A) NOT DETECTED Final    Comment: CRITICAL RESULT CALLED TO, READ BACK BY AND VERIFIED WITH: Barth Kirks PHARMD 1911 08/22/20 A BROWNING    Staphylococcus epidermidis NOT DETECTED NOT DETECTED Final   Staphylococcus lugdunensis NOT DETECTED NOT DETECTED Final   Streptococcus species NOT DETECTED NOT DETECTED Final   Streptococcus agalactiae NOT DETECTED NOT DETECTED Final   Streptococcus pneumoniae NOT DETECTED NOT DETECTED Final   Streptococcus pyogenes NOT DETECTED NOT DETECTED Final   A.calcoaceticus-baumannii NOT DETECTED NOT DETECTED Final   Bacteroides fragilis NOT DETECTED NOT DETECTED Final   Enterobacterales NOT DETECTED NOT DETECTED Final   Enterobacter cloacae complex NOT DETECTED NOT DETECTED Final   Escherichia coli NOT DETECTED NOT DETECTED Final   Klebsiella aerogenes NOT DETECTED NOT DETECTED Final   Klebsiella oxytoca NOT DETECTED NOT DETECTED Final   Klebsiella pneumoniae NOT DETECTED NOT DETECTED Final   Proteus species NOT DETECTED NOT DETECTED Final   Salmonella species NOT DETECTED NOT DETECTED Final   Serratia marcescens NOT DETECTED NOT DETECTED Final   Haemophilus influenzae NOT  DETECTED NOT DETECTED Final   Neisseria meningitidis NOT DETECTED NOT DETECTED Final   Pseudomonas aeruginosa NOT DETECTED NOT DETECTED Final   Stenotrophomonas maltophilia NOT DETECTED NOT DETECTED Final   Candida albicans NOT DETECTED NOT DETECTED Final   Candida auris NOT DETECTED NOT DETECTED Final   Candida glabrata NOT DETECTED NOT DETECTED Final   Candida krusei NOT DETECTED NOT DETECTED Final   Candida parapsilosis NOT DETECTED NOT DETECTED Final   Candida tropicalis NOT DETECTED NOT DETECTED Final   Cryptococcus neoformans/gattii NOT DETECTED NOT DETECTED Final   Meth  resistant mecA/C and MREJ NOT DETECTED NOT DETECTED Final    Comment: Performed at [facility], [place] [address], [place], [place] [address]  Respiratory Panel by RT PCR (Flu A&B, Covid) - Nasopharyngeal Swab     Status: None   Collection Time: 08/21/20  8:27 AM   Specimen: Nasopharyngeal Swab  Result Value Ref Range Status   SARS Coronavirus 2 by RT PCR NEGATIVE NEGATIVE Final    Comment: (NOTE) SARS-CoV-2 target nucleic acids are NOT DETECTED.  The SARS-CoV-2 RNA is generally detectable in upper respiratoy specimens during the acute phase of infection. The lowest concentration of SARS-CoV-2 viral copies this assay can detect is 131 copies/mL. A negative result does not preclude SARS-Cov-2 infection and should not be used as the sole basis for treatment or other patient management decisions. A negative result may occur with  improper specimen collection/handling, submission of specimen other than nasopharyngeal swab, presence of viral mutation(s) within the areas targeted by this assay, and inadequate number of viral copies (<131 copies/mL). A negative result must be combined with clinical observations, patient history, and epidemiological information. The expected result is Negative.  Fact Sheet for Patients:  PinkCheek.be  Fact Sheet for Healthcare Providers:  GravelBags.it  This test is no t yet approved or cleared by the [place] FDA and  has been authorized for detection and/or diagnosis of SARS-CoV-2 by FDA under an Emergency Use Authorization (EUA). This EUA will remain  in effect (meaning this test can be used) for the duration of the COVID-19 declaration under Section 564(b)(1) of the Act, 21 U.S.C. section 360bbb-3(b)(1), unless the authorization is terminated or revoked sooner.     Influenza A by PCR NEGATIVE NEGATIVE Final   Influenza B by PCR NEGATIVE NEGATIVE Final    Comment: (NOTE) The Xpert  Xpress SARS-CoV-2/FLU/RSV assay is intended as an aid in  the diagnosis of influenza from Nasopharyngeal swab specimens and  should not be used as a sole basis for treatment. Nasal washings and  aspirates are unacceptable for Xpert Xpress SARS-CoV-2/FLU/RSV  testing.  Fact Sheet for Patients: PinkCheek.be  Fact Sheet for Healthcare Providers: GravelBags.it  This test is not yet approved or cleared by the [place] FDA and  has been authorized for detection and/or diagnosis of SARS-CoV-2 by  FDA under an Emergency Use Authorization (EUA). This EUA will remain  in effect (meaning this test can be used) for the duration of the  Covid-19 declaration under Section 564(b)(1) of the Act, 21  U.S.C. section 360bbb-3(b)(1), unless the authorization is  terminated or revoked. Performed at [facility], [place] [address], [place], [place] [address]   Culture, blood (routine x 2)     Status: None (Preliminary result)   Collection Time: 08/23/20  7:20 AM   Specimen: BLOOD RIGHT HAND  Result Value Ref Range Status   Specimen Description   Final    BLOOD RIGHT HAND  Performed at [facility], [place] [address] [place], [place] [address]    Special Requests   Final    BOTTLES DRAWN AEROBIC ONLY Blood Culture adequate volume Performed at [place] [address] [place], [place] [address]    Culture   Final    NO GROWTH 1 DAY Performed at [facility], [place] [address], [place], [place] [address]    Report Status PENDING  Incomplete  Culture, blood (routine x 2)     Status: None (Preliminary result)   Collection Time: 08/23/20  7:36 AM   Specimen: BLOOD RIGHT HAND  Result Value Ref Range Status   Specimen Description   Final    BLOOD RIGHT HAND Performed at [place] [address], [place], [place] [address]    Special Requests   Final     BOTTLES DRAWN AEROBIC ONLY Blood Culture adequate volume Performed at [place] [address] [place], [place] [address]    Culture   Final    NO GROWTH 1 DAY Performed at [facility], [place] [address] [place], [place] [address]    Report Status PENDING  Incomplete     Medications:    (feeding supplement) PROSource Plus  30 mL Oral BID BM   chlorhexidine  15 mL Mouth Rinse BID   enoxaparin (LOVENOX) injection  30 mg Subcutaneous Q24H   feeding supplement  1 Container Oral TID BM   magnesium oxide  400 mg Oral TID   mouth rinse  15 mL Mouth Rinse q12n4p   metoprolol tartrate  25 mg Oral Q8H   multivitamin with minerals  1 tablet Oral Daily   scopolamine  1 patch Transdermal Q72H   simethicone  40 mg Oral QID   sodium chloride flush  3 mL Intravenous Q12H   Continuous Infusions:   ceFAZolin (ANCEF) IV 2 g (08/25/20 0616)   dextrose 10 % and 0.45 % NaCl 1,000 mL with potassium chloride 40 mEq infusion 50 mL/hr at 08/24/20 1425      LOS: 4 days   Charlynne Cousins  [facility]  08/25/2020, 7:02 AM

## 2020-08-26 ENCOUNTER — Inpatient Hospital Stay (HOSPITAL_COMMUNITY): Payer: Managed Care, Other (non HMO)

## 2020-08-26 ENCOUNTER — Inpatient Hospital Stay: Payer: Self-pay

## 2020-08-26 DIAGNOSIS — B9561 Methicillin susceptible Staphylococcus aureus infection as the cause of diseases classified elsewhere: Secondary | ICD-10-CM | POA: Diagnosis not present

## 2020-08-26 DIAGNOSIS — T80211A Bloodstream infection due to central venous catheter, initial encounter: Secondary | ICD-10-CM | POA: Diagnosis not present

## 2020-08-26 LAB — BASIC METABOLIC PANEL
Anion gap: 6 (ref 5–15)
BUN: <5 mg/dL — ABNORMAL LOW (ref 6–20)
CO2: 24 mmol/L (ref 22–32)
Calcium: 8.8 mg/dL — ABNORMAL LOW (ref 8.9–10.3)
Chloride: 105 mmol/L (ref 98–111)
Creatinine, Ser: <0.3 mg/dL — ABNORMAL LOW (ref 0.61–1.24)
Glucose, Bld: 104 mg/dL — ABNORMAL HIGH (ref 70–99)
Potassium: 4.2 mmol/L (ref 3.5–5.1)
Sodium: 135 mmol/L (ref 135–145)

## 2020-08-26 LAB — GLUCOSE, CAPILLARY
Glucose-Capillary: 104 mg/dL — ABNORMAL HIGH (ref 70–99)
Glucose-Capillary: 105 mg/dL — ABNORMAL HIGH (ref 70–99)
Glucose-Capillary: 96 mg/dL (ref 70–99)
Glucose-Capillary: 96 mg/dL (ref 70–99)
Glucose-Capillary: 97 mg/dL (ref 70–99)
Glucose-Capillary: 99 mg/dL (ref 70–99)

## 2020-08-26 LAB — MAGNESIUM: Magnesium: 2 mg/dL (ref 1.7–2.4)

## 2020-08-26 LAB — PHOSPHORUS: Phosphorus: 3.4 mg/dL (ref 2.5–4.6)

## 2020-08-26 MED ORDER — CHLORHEXIDINE GLUCONATE CLOTH 2 % EX PADS
6.0000 | MEDICATED_PAD | Freq: Every day | CUTANEOUS | Status: DC
  Administered 2020-08-26 – 2020-08-27 (×2): 6 via TOPICAL

## 2020-08-26 MED ORDER — FOOD THICKENER (SIMPLYTHICK): 1.0000 | Freq: Three times a day (TID) | ORAL | Status: DC

## 2020-08-26 MED ORDER — TRAVASOL 10 % IV SOLN
INTRAVENOUS | Status: DC
  Filled 2020-08-26 (×2): qty 480

## 2020-08-26 MED ORDER — SODIUM CHLORIDE 0.9% FLUSH: 10.0000 mL | INTRAVENOUS | Status: DC | PRN

## 2020-08-26 MED ORDER — RESOURCE THICKENUP CLEAR PO POWD
Freq: Three times a day (TID) | ORAL | Status: DC
  Filled 2020-08-26: qty 125

## 2020-08-26 NOTE — Progress Notes (Signed)
Peripherally Inserted Central Catheter Placement  The IV Nurse has discussed with the patient and/or persons authorized to consent for the patient, the purpose of this procedure and the potential benefits and risks involved with this procedure.  The benefits include less needle sticks, lab draws from the catheter, and the patient may be discharged home with the catheter. Risks include, but not limited to, infection, bleeding, blood clot (thrombus formation), and puncture of an artery; nerve damage and irregular heartbeat and possibility to perform a PICC exchange if needed/ordered by physician.  Alternatives to this procedure were also discussed.  Bard Power PICC patient education guide, fact sheet on infection prevention and patient information card has been provided to patient /or left at bedside.    PICC Placement Documentation  PICC Double Lumen 08/03/24 PICC Left Cephalic 36 cm 0 cm (Active)  Indication for Insertion or Continuance of Line Administration of hyperosmolar/irritating solutions (i.e. TPN, Vancomycin, etc.) 08/26/20 1226  Lumen #1 Status Blood return noted;Saline locked 08/26/20 1226  Lumen #2 Status Blood return noted;Saline locked 08/26/20 1226  Dressing Type Transparent;Securing device 08/26/20 1226  Dressing Status Clean;Dry;Intact 08/26/20 1226  Antimicrobial disc in place? Yes 08/26/20 1226  Safety Lock Not Applicable 36/64/40 3474  Dressing Change Due 09/02/20 08/26/20 1226       Frances Maywood 08/26/2020, 12:29 PM

## 2020-08-26 NOTE — Progress Notes (Addendum)
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 Providers: GravelBags.it  This test is not yet approved or cleared by the [place] FDA and  has been authorized for detection and/or diagnosis of SARS-CoV-2 by  FDA under an Emergency Use Authorization (EUA). This EUA will  remain  in effect (meaning this test can be used) for the duration of the  Covid-19 declaration under Section 564(b)(1) of the Act, 21  U.S.C. section 360bbb-3(b)(1), unless the authorization is  terminated or revoked. Performed at [facility], [place] [address] [place], [place] [address]   Culture, blood (routine x 2)     Status: None (Preliminary result)   Collection Time: 08/23/20  7:20 AM   Specimen: BLOOD RIGHT HAND  Result Value Ref Range Status   Specimen Description   Final    BLOOD RIGHT HAND Performed at [facility] [address], [place], [place] [address]    Special Requests   Final    BOTTLES DRAWN AEROBIC ONLY Blood Culture adequate volume Performed at [facility] [address] [place], [place] [address]    Culture   Final    NO GROWTH 2 DAYS Performed at [facility] [address], [place], [place] [address]    Report Status PENDING  Incomplete  Culture, blood (routine x 2)     Status: None (Preliminary result)   Collection Time: 08/23/20  7:36 AM   Specimen: BLOOD RIGHT HAND  Result Value Ref Range Status   Specimen Description   Final  BLOOD RIGHT HAND Performed at [facility], [place] [address] [place], [place] [address]    Special Requests   Final    BOTTLES DRAWN AEROBIC ONLY Blood Culture adequate volume Performed at [facility] [address], [place], [place] [address]    Culture   Final    NO GROWTH 2 DAYS Performed at [facility] [address], [place], [place] [address]    Report Status PENDING  Incomplete     Medications:   . (feeding supplement) PROSource Plus  30 mL Oral BID BM  . chlorhexidine  15 mL Mouth Rinse BID  . enoxaparin (LOVENOX) injection  30 mg Subcutaneous Q24H  . feeding supplement  1 Container Oral TID BM  . magnesium oxide  400 mg Oral TID  . mouth rinse  15 mL Mouth Rinse q12n4p  . metoprolol  tartrate  25 mg Oral Q8H  . multivitamin with minerals  1 tablet Oral Daily  . scopolamine  1 patch Transdermal Q72H  . simethicone  40 mg Oral QID  . sodium chloride flush  3 mL Intravenous Q12H   Continuous Infusions: .  ceFAZolin (ANCEF) IV 2 g (08/26/20 0516)  . dextrose 10 % and 0.45 % NaCl 1,000 mL with potassium chloride 40 mEq infusion 50 mL/hr at 08/25/20 1234      LOS: 5 days   Charlynne Cousins  [facility]  08/26/2020, 7:03 AM

## 2020-08-26 NOTE — Progress Notes (Signed)
PHARMACY CONSULT NOTE FOR:  OUTPATIENT  PARENTERAL ANTIBIOTIC THERAPY (OPAT)  Indication: MSSA bacteremia Regimen: Cefazolin 2g IV q8h End date: 09/20/2020  IV antibiotic discharge orders are pended. To discharging provider:  please sign these orders via discharge navigator,  Select New Orders & click on the button choice - Manage This Unsigned Work.     Thank you for allowing pharmacy to be a part of this patient's care.  Esmeralda Links, PharmD Candidate 08/26/2020, 1:44 PM

## 2020-08-26 NOTE — Progress Notes (Signed)
Nutrition Follow-up  RD working remotely.  DOCUMENTATION CODES:   Underweight  INTERVENTION:  - continue Boost Breeze TID and 30 ml Prosource Plus BID.  - continue encouragement of nutrition brought in from home, as tolerated. - TPN initiation and management per Pharmacist.   - if family does pursue J-tube outpatient, recommend Osmolite 1.2 @ 15 ml/hr to advance by 10 ml every 24 hours to goal rate of 45 ml/hr. - at goal rate, this regimen would provide 1296 kcal (91% estimated kcal need), 60 grams protein (80% estimated protein need), and 886 ml free water. - once at goal rate, could transition to a nocturnal regimen to allow time off of pump during the day. - this regimen could also be adjusted outpatient dependent on PO intake at that time.    NUTRITION DIAGNOSIS:   Increased nutrient needs related to acute illness, chronic illness as evidenced by estimated needs -ongoing  GOAL:   Patient will meet greater than or equal to 90% of their needs -to be met with PO intakes and TPN regimen  MONITOR:   PO intake, Supplement acceptance, Labs, Weight trends, Other (Comment) (TPN regimen)  REASON FOR ASSESSMENT:   Consult New TPN/TNA  ASSESSMENT:   24 y.o. 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 He presented to the ED on 11/14 due to confusion, fevers, abdominal discomfort with mild distention, and nausea. CXR showed possible bronchopneumonia. It was noted that he had staph aureus grow out from PICC line cultures.  He has been consuming Prosource Plus BID 100% of the time offered (total of 200 kcal, 30 grams protein per day) and Boost Breeze TID nearly 100% of the time (100% provides 750 kcal, 27 grams protein per day).  Total, these oral supplements provide 950 kcal (67% estimated kcal need), 57 grams protein (76% estimated protein need).   Unsure  of the amount of nutrition brought in from home patient is consuming. Able to communicate with RN via secure chat and she does not think patient has had any yet today but plans to check the next time she is in the room.   Patient's dad is the one who stays at bedside with him. Attempted to call dad this AM at mobile number listed in the flow sheet but unable to reach him.   Recommend continuing Prosource at home and will document as such in the AVS, but Boost Breeze would require the purchase of thickener for home use and may become burdensome.   Able to communicate with Pharmacist about plan for PICC replacement today and re-start of TPN. Recommend TPN meet 70% estimated needs at this time based on what is known about PO intakes this week. This would provide ~1000 kcal and 52 grams protein.   MD note from earlier this AM states that family is now amendable to considering J-tube placement. Note outlines that this will be followed-up outpatient and no plans for intervention this admission.      Labs reviewed; CBGs: 99, 96, 105 mg/dl, BUN: <5 mg/dl, creatinine: <0.3 mg/dl, Ca: 8.8 mg/dl. Medications reviewed; 400 mg mag-ox TID, 1 tablet multivitamin with minerals/day. IVF; D10-1/2 NS-40 mEq IV KCl @ 50 ml/hr (408 kcal).   Diet Order:   Diet Order            Diet clear liquid Room service appropriate? Yes; Fluid consistency: Thin  Diet effective now  EDUCATION NEEDS:   Education needs have been addressed  Skin:  Skin Assessment: Reviewed RN Assessment  Last BM:  11/19  Height:   Ht Readings from Last 1 Encounters:  08/22/20 '5\' 2"'  (1.575 m)    Weight:   Wt Readings from Last 1 Encounters:  08/26/20 37 kg     Estimated Nutritional Needs:  Kcal:  1425-1630 kcal Protein:  75-90 grams Fluid:  >/= 1.6 L/day     Jarome Matin, MS, RD, LDN, CNSC Inpatient Clinical Dietitian RD pager # available in [facility]  After hours/weekend pager # available in  [facility]

## 2020-08-26 NOTE — Progress Notes (Signed)
PICC pulled back to the 3 cm mark.

## 2020-08-26 NOTE — Plan of Care (Signed)
  Problem: Nutrition: Goal: Adequate nutrition will be maintained Outcome: Not Progressing   

## 2020-08-26 NOTE — Discharge Instructions (Signed)
Recommend continuing 30 ml Prosource twice per day after discharge. A 30 oz/887 ml bottle can be purchased on [employer] for $35.

## 2020-08-26 NOTE — Progress Notes (Signed)
PHARMACY - TOTAL PARENTERAL NUTRITION CONSULT NOTE   Indication: Home TPN  Patient Measurements: Height: 5\' 2"  (157.5 cm) Weight: 41.4 kg (91 lb 4.3 oz) IBW/kg (Calculated) : 54.6   Body mass index is 16.69 kg/m.   Assessment: Home cyclic TPN, PICC line d/c for line infection, currently on Ancef 2gm q8.  Replaced PICC line 11/19, resuming TPN, anticipate discharge home 11/20. On D10W with KCL while off TPN, taking ProSource, Boost. Discussed needs with dietitian. Discussion noted regarding placement of PEG tube as outpatient.  Glucose / Insulin:  Electrolytes: wnl Renal: wnl LFTs / TGs:  Prealbumin / albumin:  Intake / Output; MIVF: Discontinue D10W at 1800 when TPN starts GI Imaging: Surgeries / Procedures:   Central access: 11/19 TPN start date: 11/19  Nutritional Goals (per RD recommendation on 11/19): Per day: kCal: 2081 - 1630, Protein: 75-90g, Fluid: 1.6L Goal TPN rate at 70 mL/hr (provides 85 g of protein and 1585 kcals per day)  Current Nutrition:  D10W with ProSource and Boost supplements Po MVI daily  Plan:  Start TPN at 67mL/hr at 1800 Electrolytes in TPN: 29mEq/L of Na, 26mEq/L of K, 60mEq/L of Ca, 59mEq/L of Mg, and 39mmol/L of Phos. Cl:Ac ratio 1:1 Discontinue IV per MD Monitor TPN labs on Mon/Thurs  Minda Ditto PharmD 08/26/2020,7:25 AM

## 2020-08-26 NOTE — Progress Notes (Signed)
[facility]   Reason for visit: Follow up on MSSA bacteremia  Interval History: repeat blood cultures remain negative, remains afebrile.  Picc line placed today.  No associated rash or diarrhea.    Physical Exam: Constitutional:  Vitals:   08/26/20 1305 08/26/20 1338  BP: 95/61 109/68  Pulse: 84   Resp:  18  Temp:  98.4 F (36.9 C)  SpO2:     patient appears in NAD Respiratory: Normal respiratory effort on mask; CTA B Cardiovascular: RRR  Review of Systems: Constitutional: negative for fevers and chills Gastrointestinal: negative for nausea and diarrhea  Lab Results  Component Value Date   WBC 4.4 08/24/2020   HGB 11.2 (L) 08/24/2020   HCT 35.5 (L) 08/24/2020   MCV 84.1 08/24/2020   PLT 98 (L) 08/24/2020    Lab Results  Component Value Date   CREATININE <0.30 (L) 08/26/2020   BUN <5 (L) 08/26/2020   NA 135 08/26/2020   K 4.2 08/26/2020   CL 105 08/26/2020   CO2 24 08/26/2020    Lab Results  Component Value Date   ALT 278 (H) 08/24/2020   AST 111 (H) 08/24/2020   ALKPHOS 171 (H) 08/24/2020     Microbiology: Recent Results (from the past 240 hour(s))  Culture, blood (routine x 2)     Status: Abnormal   Collection Time: 08/20/20  4:10 PM   Specimen: BLOOD LEFT ARM  Result Value Ref Range Status   Specimen Description   Final    BLOOD LEFT ARM UPPER PICC Performed at [facility], [address] [place], [place] [address]    Special Requests   Final    BOTTLES DRAWN AEROBIC AND ANAEROBIC Blood Culture adequate volume Performed at [facility], [address] [place], [place] [address]    Culture  Setup Time   Final    GRAM POSITIVE COCCI IN BOTH AEROBIC AND ANAEROBIC BOTTLES Organism ID to follow CRITICAL RESULT CALLED TO, READ BACK BY AND VERIFIED WITH: PHRMD M LILLISON _0  08/21/20 BY S GEZAHEGN Performed at [facility], [address] [place], [place] [address]    Culture  STAPHYLOCOCCUS AUREUS (A)  Final   Report Status 08/23/2020 FINAL  Final   Organism ID, Bacteria STAPHYLOCOCCUS AUREUS  Final      Susceptibility   Staphylococcus aureus - MIC*    CIPROFLOXACIN <=0.5 SENSITIVE Sensitive     ERYTHROMYCIN <=0.25 SENSITIVE Sensitive     GENTAMICIN <=0.5 SENSITIVE Sensitive     OXACILLIN 0.5 SENSITIVE Sensitive     TETRACYCLINE <=1 SENSITIVE Sensitive     VANCOMYCIN 1 SENSITIVE Sensitive     TRIMETH/SULFA <=10 SENSITIVE Sensitive     CLINDAMYCIN <=0.25 SENSITIVE Sensitive     RIFAMPIN <=0.5 SENSITIVE Sensitive     Inducible Clindamycin NEGATIVE Sensitive     * STAPHYLOCOCCUS AUREUS  Blood Culture ID Panel (Reflexed)     Status: Abnormal   Collection Time: 08/20/20  4:10 PM  Result Value Ref Range Status   Enterococcus faecalis NOT DETECTED NOT DETECTED Final   Enterococcus Faecium NOT DETECTED NOT DETECTED Final   Listeria monocytogenes NOT DETECTED NOT DETECTED Final   Staphylococcus species DETECTED (A) NOT DETECTED Final    Comment: CRITICAL RESULT CALLED TO, READ BACK BY AND VERIFIED WITH: PHRMD M LILLISON _1  08/21/20 BY S GEZAHEGN    Staphylococcus aureus (BCID) DETECTED (A) NOT DETECTED Final    Comment: CRITICAL RESULT  CALLED TO, READ BACK BY AND VERIFIED WITH: PHRMD M LILLISON _0  08/21/20 BY S GEZAHEGN    Staphylococcus epidermidis NOT DETECTED NOT DETECTED Final   Staphylococcus lugdunensis NOT DETECTED NOT DETECTED Final   Streptococcus species NOT DETECTED NOT DETECTED Final   Streptococcus agalactiae NOT DETECTED NOT DETECTED Final   Streptococcus pneumoniae NOT DETECTED NOT DETECTED Final   Streptococcus pyogenes NOT DETECTED NOT DETECTED Final   A.calcoaceticus-baumannii NOT DETECTED NOT DETECTED Final   Bacteroides fragilis NOT DETECTED NOT DETECTED Final   Enterobacterales NOT DETECTED NOT DETECTED Final   Enterobacter cloacae complex NOT DETECTED NOT DETECTED Final   Escherichia coli NOT DETECTED NOT DETECTED Final    Klebsiella aerogenes NOT DETECTED NOT DETECTED Final   Klebsiella oxytoca NOT DETECTED NOT DETECTED Final   Klebsiella pneumoniae NOT DETECTED NOT DETECTED Final   Proteus species NOT DETECTED NOT DETECTED Final   Salmonella species NOT DETECTED NOT DETECTED Final   Serratia marcescens NOT DETECTED NOT DETECTED Final   Haemophilus influenzae NOT DETECTED NOT DETECTED Final   Neisseria meningitidis NOT DETECTED NOT DETECTED Final   Pseudomonas aeruginosa NOT DETECTED NOT DETECTED Final   Stenotrophomonas maltophilia NOT DETECTED NOT DETECTED Final   Candida albicans NOT DETECTED NOT DETECTED Final   Candida auris NOT DETECTED NOT DETECTED Final   Candida glabrata NOT DETECTED NOT DETECTED Final   Candida krusei NOT DETECTED NOT DETECTED Final   Candida parapsilosis NOT DETECTED NOT DETECTED Final   Candida tropicalis NOT DETECTED NOT DETECTED Final   Cryptococcus neoformans/gattii NOT DETECTED NOT DETECTED Final   Meth resistant mecA/C and MREJ NOT DETECTED NOT DETECTED Final    Comment: Performed at [facility], [address] [place],  [address]  Respiratory Panel by RT PCR (Flu A&B, Covid) - Nasopharyngeal Swab     Status: None   Collection Time: 08/20/20  4:16 PM   Specimen: Nasopharyngeal Swab  Result Value Ref Range Status   SARS Coronavirus 2 by RT PCR NEGATIVE NEGATIVE Final    Comment: (NOTE) SARS-CoV-2 target nucleic acids are NOT DETECTED.  The SARS-CoV-2 RNA is generally detectable in upper respiratoy specimens during the acute phase of infection. The lowest concentration of SARS-CoV-2 viral copies this assay can detect is 131 copies/mL. A negative result does not preclude SARS-Cov-2 infection and should not be used as the sole basis for treatment or other patient management decisions. A negative result may occur with  improper specimen collection/handling, submission of specimen other than nasopharyngeal swab, presence of viral mutation(s) within  the areas targeted by this assay, and inadequate number of viral copies (<131 copies/mL). A negative result must be combined with clinical observations, patient history, and epidemiological information. The expected result is Negative.  Fact Sheet for Patients:  PinkCheek.be  Fact Sheet for Healthcare Providers:  GravelBags.it  This test is no t yet approved or cleared by the [place] FDA and  has been authorized for detection and/or diagnosis of SARS-CoV-2 by FDA under an Emergency Use Authorization (EUA). This EUA will remain  in effect (meaning this test can be used) for the duration of the COVID-19 declaration under Section 564(b)(1) of the Act, 21 U.S.C. section 360bbb-3(b)(1), unless the authorization is terminated or revoked sooner.     Influenza A by PCR NEGATIVE NEGATIVE Final   Influenza B by PCR NEGATIVE NEGATIVE Final    Comment: (NOTE) The Xpert Xpress SARS-CoV-2/FLU/RSV assay is intended as an aid in  the diagnosis of influenza from Nasopharyngeal swab  specimens and  should not be used as a sole basis for treatment. Nasal washings and  aspirates are unacceptable for Xpert Xpress SARS-CoV-2/FLU/RSV  testing.  Fact Sheet for Patients: PinkCheek.be  Fact Sheet for Healthcare Providers: GravelBags.it  This test is not yet approved or cleared by the [place] FDA and  has been authorized for detection and/or diagnosis of SARS-CoV-2 by  FDA under an Emergency Use Authorization (EUA). This EUA will remain  in effect (meaning this test can be used) for the duration of the  Covid-19 declaration under Section 564(b)(1) of the Act, 21  U.S.C. section 360bbb-3(b)(1), unless the authorization is  terminated or revoked. Performed at [facility], [address] [place], [place] [address]   Urine culture     Status: Abnormal   Collection  Time: 08/20/20  7:36 PM   Specimen: Urine, Random  Result Value Ref Range Status   Specimen Description   Final    URINE, RANDOM Performed at [facility], [address] [place], [place] [address]    Special Requests   Final    NONE Performed at [facility], [address] [place], [place] [address]    Culture (A)  Final    60,000 COLONIES/mL ESCHERICHIA COLI 30,000 COLONIES/mL ENTEROCOCCUS FAECALIS    Report Status 08/23/2020 FINAL  Final   Organism ID, Bacteria ESCHERICHIA COLI (A)  Final   Organism ID, Bacteria ENTEROCOCCUS FAECALIS (A)  Final      Susceptibility   Escherichia coli - MIC*    AMPICILLIN >=32 RESISTANT Resistant     CEFAZOLIN <=4 SENSITIVE Sensitive     CEFEPIME <=0.12 SENSITIVE Sensitive     CEFTRIAXONE <=0.25 SENSITIVE Sensitive     CIPROFLOXACIN >=4 RESISTANT Resistant     GENTAMICIN <=1 SENSITIVE Sensitive     IMIPENEM <=0.25 SENSITIVE Sensitive     NITROFURANTOIN <=16 SENSITIVE Sensitive     TRIMETH/SULFA <=20 SENSITIVE Sensitive     AMPICILLIN/SULBACTAM >=32 RESISTANT Resistant     PIP/TAZO <=4 SENSITIVE Sensitive     * 60,000 COLONIES/mL ESCHERICHIA COLI   Enterococcus faecalis - MIC*    AMPICILLIN <=2 SENSITIVE Sensitive     NITROFURANTOIN <=16 SENSITIVE Sensitive     VANCOMYCIN 1 SENSITIVE Sensitive     * 30,000 COLONIES/mL ENTEROCOCCUS FAECALIS  Urine culture     Status: Abnormal   Collection Time: 08/21/20  8:14 AM   Specimen: In/Out Cath Urine  Result Value Ref Range Status   Specimen Description   Final    IN/OUT CATH URINE Performed at [facility], [place] [address], [place], [place] [address]    Special Requests   Final    URINE, RANDOM Performed at [facility] [address], [place], [place] [address]    Culture (A)  Final    20,000 COLONIES/mL ESCHERICHIA COLI 20,000 COLONIES/mL ENTEROCOCCUS FAECALIS    Report Status 08/23/2020 FINAL  Final   Organism ID, Bacteria  ESCHERICHIA COLI (A)  Final   Organism ID, Bacteria ENTEROCOCCUS FAECALIS (A)  Final      Susceptibility   Escherichia coli - MIC*    AMPICILLIN >=32 RESISTANT Resistant     CEFAZOLIN 16 SENSITIVE Sensitive     CEFEPIME <=0.12 SENSITIVE Sensitive     CEFTRIAXONE <=0.25 SENSITIVE Sensitive     CIPROFLOXACIN >=4 RESISTANT Resistant     GENTAMICIN <=1 SENSITIVE Sensitive     IMIPENEM <=0.25 SENSITIVE Sensitive  NITROFURANTOIN <=16 SENSITIVE Sensitive     TRIMETH/SULFA <=20 SENSITIVE Sensitive     AMPICILLIN/SULBACTAM >=32 RESISTANT Resistant     PIP/TAZO <=4 SENSITIVE Sensitive     * 20,000 COLONIES/mL ESCHERICHIA COLI   Enterococcus faecalis - MIC*    AMPICILLIN <=2 SENSITIVE Sensitive     NITROFURANTOIN <=16 SENSITIVE Sensitive     VANCOMYCIN 1 SENSITIVE Sensitive     * 20,000 COLONIES/mL ENTEROCOCCUS FAECALIS  Blood culture (routine x 2)     Status: Abnormal   Collection Time: 08/21/20  8:14 AM   Specimen: BLOOD RIGHT ARM  Result Value Ref Range Status   Specimen Description   Final    BLOOD RIGHT ARM Performed at [facility], [place] [address] [place], [place] [address]    Special Requests   Final    BOTTLES DRAWN AEROBIC ONLY Blood Culture results may not be optimal due to an inadequate volume of blood received in culture bottles Performed at [facility] [address], [place], [place] [address]    Culture  Setup Time   Final    GRAM POSITIVE COCCI IN CLUSTERS AEROBIC BOTTLE ONLY Organism ID to follow CRITICAL RESULT CALLED TO, READ BACK BY AND VERIFIED WITH: Barth Kirks [facility] [id number] 08/22/20 A BROWNING Performed at [facility], [address], [place], [place] [address]    Culture STAPHYLOCOCCUS AUREUS (A)  Final   Report Status 08/24/2020 FINAL  Final   Organism ID, Bacteria STAPHYLOCOCCUS AUREUS  Final      Susceptibility   Staphylococcus aureus - MIC*    CIPROFLOXACIN <=0.5 SENSITIVE Sensitive     ERYTHROMYCIN <=0.25 SENSITIVE  Sensitive     GENTAMICIN <=0.5 SENSITIVE Sensitive     OXACILLIN <=0.25 SENSITIVE Sensitive     TETRACYCLINE <=1 SENSITIVE Sensitive     VANCOMYCIN 1 SENSITIVE Sensitive     TRIMETH/SULFA <=10 SENSITIVE Sensitive     CLINDAMYCIN <=0.25 SENSITIVE Sensitive     RIFAMPIN <=0.5 SENSITIVE Sensitive     Inducible Clindamycin NEGATIVE Sensitive     * STAPHYLOCOCCUS AUREUS  Blood Culture ID Panel (Reflexed)     Status: Abnormal   Collection Time: 08/21/20  8:14 AM  Result Value Ref Range Status   Enterococcus faecalis NOT DETECTED NOT DETECTED Final   Enterococcus Faecium NOT DETECTED NOT DETECTED Final   Listeria monocytogenes NOT DETECTED NOT DETECTED Final   Staphylococcus species DETECTED (A) NOT DETECTED Final    Comment: CRITICAL RESULT CALLED TO, READ BACK BY AND VERIFIED WITH: Barth Kirks PHARMD 1911 08/22/20 A BROWNING    Staphylococcus aureus (BCID) DETECTED (A) NOT DETECTED Final    Comment: CRITICAL RESULT CALLED TO, READ BACK BY AND VERIFIED WITH: Barth Kirks PHARMD 1911 08/22/20 A BROWNING    Staphylococcus epidermidis NOT DETECTED NOT DETECTED Final   Staphylococcus lugdunensis NOT DETECTED NOT DETECTED Final   Streptococcus species NOT DETECTED NOT DETECTED Final   Streptococcus agalactiae NOT DETECTED NOT DETECTED Final   Streptococcus pneumoniae NOT DETECTED NOT DETECTED Final   Streptococcus pyogenes NOT DETECTED NOT DETECTED Final   A.calcoaceticus-baumannii NOT DETECTED NOT DETECTED Final   Bacteroides fragilis NOT DETECTED NOT DETECTED Final   Enterobacterales NOT DETECTED NOT DETECTED Final   Enterobacter cloacae complex NOT DETECTED NOT DETECTED Final   Escherichia coli NOT DETECTED NOT DETECTED Final   Klebsiella aerogenes NOT DETECTED NOT DETECTED Final   Klebsiella oxytoca NOT DETECTED NOT DETECTED Final   Klebsiella pneumoniae NOT DETECTED NOT DETECTED Final  Proteus species NOT DETECTED NOT DETECTED Final   Salmonella species NOT DETECTED NOT DETECTED Final    Serratia marcescens NOT DETECTED NOT DETECTED Final   Haemophilus influenzae NOT DETECTED NOT DETECTED Final   Neisseria meningitidis NOT DETECTED NOT DETECTED Final   Pseudomonas aeruginosa NOT DETECTED NOT DETECTED Final   Stenotrophomonas maltophilia NOT DETECTED NOT DETECTED Final   Candida albicans NOT DETECTED NOT DETECTED Final   Candida auris NOT DETECTED NOT DETECTED Final   Candida glabrata NOT DETECTED NOT DETECTED Final   Candida krusei NOT DETECTED NOT DETECTED Final   Candida parapsilosis NOT DETECTED NOT DETECTED Final   Candida tropicalis NOT DETECTED NOT DETECTED Final   Cryptococcus neoformans/gattii NOT DETECTED NOT DETECTED Final   Meth resistant mecA/C and MREJ NOT DETECTED NOT DETECTED Final    Comment: Performed at [facility], [address] [place], [place] [address]  Respiratory Panel by RT PCR (Flu A&B, Covid) - Nasopharyngeal Swab     Status: None   Collection Time: 08/21/20  8:27 AM   Specimen: Nasopharyngeal Swab  Result Value Ref Range Status   SARS Coronavirus 2 by RT PCR NEGATIVE NEGATIVE Final    Comment: (NOTE) SARS-CoV-2 target nucleic acids are NOT DETECTED.  The SARS-CoV-2 RNA is generally detectable in upper respiratoy specimens during the acute phase of infection. The lowest concentration of SARS-CoV-2 viral copies this assay can detect is 131 copies/mL. A negative result does not preclude SARS-Cov-2 infection and should not be used as the sole basis for treatment or other patient management decisions. A negative result may occur with  improper specimen collection/handling, submission of specimen other than nasopharyngeal swab, presence of viral mutation(s) within the areas targeted by this assay, and inadequate number of viral copies (<131 copies/mL). A negative result must be combined with clinical observations, patient history, and epidemiological information. The expected result is Negative.  Fact Sheet for Patients:   PinkCheek.be  Fact Sheet for Healthcare Providers:  GravelBags.it  This test is no t yet approved or cleared by the [place] FDA and  has been authorized for detection and/or diagnosis of SARS-CoV-2 by FDA under an Emergency Use Authorization (EUA). This EUA will remain  in effect (meaning this test can be used) for the duration of the COVID-19 declaration under Section 564(b)(1) of the Act, 21 U.S.C. section 360bbb-3(b)(1), unless the authorization is terminated or revoked sooner.     Influenza A by PCR NEGATIVE NEGATIVE Final   Influenza B by PCR NEGATIVE NEGATIVE Final    Comment: (NOTE) The Xpert Xpress SARS-CoV-2/FLU/RSV assay is intended as an aid in  the diagnosis of influenza from Nasopharyngeal swab specimens and  should not be used as a sole basis for treatment. Nasal washings and  aspirates are unacceptable for Xpert Xpress SARS-CoV-2/FLU/RSV  testing.  Fact Sheet for Patients: PinkCheek.be  Fact Sheet for Healthcare Providers: GravelBags.it  This test is not yet approved or cleared by the [place] FDA and  has been authorized for detection and/or diagnosis of SARS-CoV-2 by  FDA under an Emergency Use Authorization (EUA). This EUA will remain  in effect (meaning this test can be used) for the duration of the  Covid-19 declaration under Section 564(b)(1) of the Act, 21  U.S.C. section 360bbb-3(b)(1), unless the authorization is  terminated or revoked. Performed at [facility], [place] [address] [place], [place] [address]   Culture, blood (routine x 2)     Status: None (Preliminary result)   Collection  Time: 08/23/20  7:20 AM   Specimen: BLOOD RIGHT HAND  Result Value Ref Range Status   Specimen Description   Final    BLOOD RIGHT HAND Performed at [facility] [address] [place], [place]  [address]    Special Requests   Final    BOTTLES DRAWN AEROBIC ONLY Blood Culture adequate volume Performed at [facility] [address], [place], [place] [address]    Culture   Final    NO GROWTH 3 DAYS Performed at [facility], [place] [address] [place], [place] [address]    Report Status PENDING  Incomplete  Culture, blood (routine x 2)     Status: None (Preliminary result)   Collection Time: 08/23/20  7:36 AM   Specimen: BLOOD RIGHT HAND  Result Value Ref Range Status   Specimen Description   Final    BLOOD RIGHT HAND Performed at [facility] [address], [place], [place] [address]    Special Requests   Final    BOTTLES DRAWN AEROBIC ONLY Blood Culture adequate volume Performed at [facility] [address], [place], [place] [address]    Culture   Final    NO GROWTH 3 DAYS Performed at [facility], [place] [address], [place], [place] [address]    Report Status PENDING  Incomplete    Impression/Plan:  1. picc line infection - blood cultures remain negative.  TTE ok.  Not a good candidate for TEE.   Continue with cefazolin for 4 weeks from negative blood culture through December 14th.    2.  Access - picc line replaced today.    OPAT order placed.    Diagnosis: MSSA bacteremia from line infection  Culture Result: MSSA  Allergies  Allergen Reactions  . Fentanyl Other (See Comments)    Dizziness    OPAT Orders Discharge antibiotics to be given via PICC line Discharge antibiotics:cefazolin Per pharmacy protocol yes Duration: 4 weeks through 12/14  [facility] Care Per Protocol: yes  Home health RN for IV administration and teaching; PICC line care and labs.    Labs weekly while on IV antibiotics: _x_ CBC with differential __ BMP _x_ CMP __ CRP __ ESR __ Vancomycin trough __ CK  __ Please pull PIC at completion of IV antibiotics _x_ Please leave PIC in place, PICC is used for TPN.  This  can be pulled though once Gtube placed if he is now agreeable to that.    Fax weekly labs to [phone]

## 2020-08-27 DIAGNOSIS — Z452 Encounter for adjustment and management of vascular access device: Secondary | ICD-10-CM

## 2020-08-27 DIAGNOSIS — R7401 Elevation of levels of liver transaminase levels: Secondary | ICD-10-CM

## 2020-08-27 LAB — DIFFERENTIAL
Abs Immature Granulocytes: 0.01 10*3/uL (ref 0.00–0.07)
Basophils Absolute: 0 10*3/uL (ref 0.0–0.1)
Basophils Relative: 0 %
Eosinophils Absolute: 0.1 10*3/uL (ref 0.0–0.5)
Eosinophils Relative: 2 %
Immature Granulocytes: 0 %
Lymphocytes Relative: 34 %
Lymphs Abs: 1.9 10*3/uL (ref 0.7–4.0)
Monocytes Absolute: 0.9 10*3/uL (ref 0.1–1.0)
Monocytes Relative: 17 %
Neutro Abs: 2.6 10*3/uL (ref 1.7–7.7)
Neutrophils Relative %: 47 %

## 2020-08-27 LAB — PREALBUMIN: Prealbumin: 8.8 mg/dL — ABNORMAL LOW (ref 18–38)

## 2020-08-27 LAB — GLUCOSE, CAPILLARY
Glucose-Capillary: 114 mg/dL — ABNORMAL HIGH (ref 70–99)
Glucose-Capillary: 137 mg/dL — ABNORMAL HIGH (ref 70–99)
Glucose-Capillary: 91 mg/dL (ref 70–99)
Glucose-Capillary: 93 mg/dL (ref 70–99)

## 2020-08-27 LAB — COMPREHENSIVE METABOLIC PANEL
ALT: 73 U/L — ABNORMAL HIGH (ref 0–44)
AST: 40 U/L (ref 15–41)
Albumin: 3 g/dL — ABNORMAL LOW (ref 3.5–5.0)
Alkaline Phosphatase: 135 U/L — ABNORMAL HIGH (ref 38–126)
Anion gap: 5 (ref 5–15)
BUN: <5 mg/dL — ABNORMAL LOW (ref 6–20)
CO2: 26 mmol/L (ref 22–32)
Calcium: 7.9 mg/dL — ABNORMAL LOW (ref 8.9–10.3)
Chloride: 104 mmol/L (ref 98–111)
Creatinine, Ser: <0.3 mg/dL — ABNORMAL LOW (ref 0.61–1.24)
Glucose, Bld: 436 mg/dL — ABNORMAL HIGH (ref 70–99)
Potassium: 5.6 mmol/L — ABNORMAL HIGH (ref 3.5–5.1)
Sodium: 135 mmol/L (ref 135–145)
Total Bilirubin: 0.9 mg/dL (ref 0.3–1.2)
Total Protein: 5.6 g/dL — ABNORMAL LOW (ref 6.5–8.1)

## 2020-08-27 LAB — CBC
HCT: 31.5 % — ABNORMAL LOW (ref 39.0–52.0)
Hemoglobin: 10.1 g/dL — ABNORMAL LOW (ref 13.0–17.0)
MCH: 26.6 pg (ref 26.0–34.0)
MCHC: 32.1 g/dL (ref 30.0–36.0)
MCV: 83.1 fL (ref 80.0–100.0)
Platelets: 130 10*3/uL — ABNORMAL LOW (ref 150–400)
RBC: 3.79 MIL/uL — ABNORMAL LOW (ref 4.22–5.81)
RDW: 15.1 % (ref 11.5–15.5)
WBC: 5.6 10*3/uL (ref 4.0–10.5)
nRBC: 0 % (ref 0.0–0.2)

## 2020-08-27 LAB — PHOSPHORUS: Phosphorus: 3 mg/dL (ref 2.5–4.6)

## 2020-08-27 LAB — TRIGLYCERIDES: Triglycerides: 55 mg/dL (ref <150)

## 2020-08-27 LAB — MAGNESIUM: Magnesium: 1.8 mg/dL (ref 1.7–2.4)

## 2020-08-27 MED ORDER — HEPARIN SOD (PORK) LOCK FLUSH 100 UNIT/ML IV SOLN
250.0000 [IU] | INTRAVENOUS | Status: DC | PRN
  Filled 2020-08-27: qty 2.5

## 2020-08-27 MED ORDER — CEFAZOLIN SODIUM-DEXTROSE 2-4 GM/100ML-% IV SOLN: 2.0000 g | Freq: Three times a day (TID) | INTRAVENOUS | Status: DC

## 2020-08-27 MED ORDER — ARGYLE SUCTION CATHETER 14FR MISC: 1.0000 | Freq: Every day | 3 refills | Status: AC

## 2020-08-27 MED ORDER — CEFAZOLIN IV (FOR PTA / DISCHARGE USE ONLY): 2.0000 g | Freq: Three times a day (TID) | INTRAVENOUS | 0 refills | Status: AC

## 2020-08-27 NOTE — Discharge Summary (Signed)
Physician Discharge Summary  Peter Hayes QIW:[id number] DOB: 03/09/1996 DOA: 08/21/2020  PCP: Penni Bombard, PA  Admit date: 08/21/2020 Discharge date: 08/27/2020  Admitted From: Home Disposition:  Home  Recommendations for Outpatient Follow-up:  1. Follow up with PCP in 1-2 weeks 2. Please obtain BMP/CBC in one week 3. Continue IV antibiotics for 4 weeks last day of antibiotic will be 09/21/2019 tomorrow 4. .  Chest x-ray as an outpatient to reevaluate him right-sided pleural effusion.   Home Health:Yes Equipment/Devices:YEs 2 L oxygen  Discharge Condition:Stable CODE STATUS:Full Diet recommendation: Heart Healthy   Brief/Interim Summary: 24 y.o. 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 through PICC line chronically and recurrent pneumonias who came back to the ED on 08/21/2020, is brought into the ED for confusion today prior to admission he was found to have a fever and a chest x-ray showing possible pneumonia saturations remained stable blood cultures were ordered, on the day of admission he was brought in with worsening confusion and blood cultures were positive for staph aureus from blood drawn from the PICC line was started on IV Ancef  Discharge Diagnoses:  Active Problems:   Muscular dystrophy ([place])   Hypoglycemia without diagnosis of diabetes mellitus   Sepsis ([place])   Bacteremia   Abnormal LFTs   Acute metabolic encephalopathy   Pneumothorax on left  Severe sepsis due to MSSA bacteremia: Blood culture 08/22/2011 + for staph surveillance blood cultures have remained negative. He has remained afebrile with no leukocytosis central line was discontinued PICC line was placed after line holiday on 08/26/2020 TEE showed the presence of PICC line in the right atrium no vegetation. Infectious disease was consulted and recommended continued IV Ancef for 4 weeks last  day of antibiotics will be 09/20/2020.  Bronchopneumonia: With high risk of aspiration he was started on IV antibiotics for complete his treatment in house.  Pneumothorax: Multiple repeat chest x-ray show improvement in his left pneumothorax which is now resolved.  Metabolic encephalopathy: (G+ minus hypoglycemia no resolved.  Hyperglycemia: Discontinued hypoglycemia resolved. CBGs were checked every 4 hours now 18 days blood glucose control.  Hypokalemia: Replete orally now resolved.  Chronic respiratory failure with hypoxia and hypercarbia: BiPAP dependent on 2 L of oxygen he remained stable.  Right pleural effusion: There is no shortness of breath he relates no dyspnea, or echo effusion remaining relatively stable follow-up as an outpatient.  Elevated LFTs: No abdominal ultrasound remained unremarkable he had no abdominal pain.  History of ileus, sigmoid volvulus status post sigmoidectomy, complicated by bile leak requiring colostomy: He is currently on TNA, his PNA had to be stopped due to his bacteremia as his central line had to be discontinued. Once he received a line holiday and he remained afebrile his central line was placed TNA was restarted he was continued at home.  Family is more amenable to a G-tube to follow-up with surgery and GI as an outpatient to discuss risk and benefits.  Duchenne's muscular dystrophy: Noted.  Sinus tach: Continue metoprolol.  Hypotension: Appears to be a chronic issue currently on midodrine.  Pancytopenia: His leukopenia is improving.     Discharge Instructions  Discharge Instructions    [facility] pharmacist to adjust dose for Vancomycin, Aminoglycosides and other anti-infective therapies as requested by physician.   Complete by: As directed    [facility] to provide Cath Flo 75m   Complete by: As  directed    Administer for PICC line occlusion and as ordered by physician for other access device  issues.   Anaphylaxis Kit: Provided to treat any anaphylactic reaction to the medication being provided to the patient if First Dose or when requested by physician   Complete by: As directed    Epinephrine 70m/ml vial / amp: Administer 0.3732m(0.32m19msubcutaneously once for moderate to severe anaphylaxis, nurse to call physician and pharmacy when reaction occurs and call 911 if needed for immediate care   Diphenhydramine 60m64m IV vial: Administer 25-60mg51mIM PRN for first dose reaction, rash, itching, mild reaction, nurse to call physician and pharmacy when reaction occurs   Sodium Chloride 0.9% NS 500ml 132mAdminister if needed for hypovolemic blood pressure drop or as ordered by physician after call to physician with anaphylactic reaction   Change dressing on IV access line weekly and PRN   Complete by: As directed    Diet - low sodium heart healthy   Complete by: As directed    Discharge wound care:   Complete by: As directed    Per above   Flush IV access with Sodium Chloride 0.9% and Heparin 10 units/ml or 100 units/ml   Complete by: As directed    Home infusion instructions - [facility]   Complete by: As directed    Instructions: Flush IV access with Sodium Chloride 0.9% and Heparin 10units/ml or 100units/ml   Change dressing on IV access line: Weekly and PRN   Instructions Cath Flo 2mg: A60mnister for PICC Line occlusion and as ordered by physician for other access device   [facility] pharmacist to adjust dose for: Vancomycin, Aminoglycosides and other anti-infective therapies as requested by physician   Increase activity slowly   Complete by: As directed    Method of administration may be changed at the discretion of home infusion pharmacist based upon assessment of the patient and/or caregiver's ability to self-administer the medication ordered   Complete by: As directed      Allergies as of 08/27/2020      Reactions   Fentanyl Other (See Comments)    Dizziness      Medication List    STOP taking these medications   acetaminophen 325 MG tablet Commonly known as: TYLENOL   doxycycline 100 MG capsule Commonly known as: VIBRAMYCIN     TAKE these medications   [place] Suction Catheter 14FR Misc 1 packet by Does not apply route daily.   ceFAZolin  IVPB Commonly known as: ANCEF Inject 2 g into the vein every 8 (eight) hours for 25 days. Indication:  MSSA bacteremia First Dose: Yes Last Day of Therapy:  09/20/20 Labs - Once weekly:  CBC/D and BMP, Labs - Every other week:  ESR and CRP Method of administration: IV Push Method of administration may be changed at the discretion of home infusion pharmacist based upon assessment of the patient and/or caregiver's ability to self-administer the medication ordered.   ceFAZolin 2-4 GM/100ML-% IVPB Commonly known as: ANCEF Inject 100 mLs (2 g total) into the vein every 8 (eight) hours.   feeding supplement Liqd You can use whatever supplement he likes.  He needs about 1300 calories per day. 1.5 liters of fluid and 50 grams of protein per day. You can buy this at the grocery or drug store.  You do not need a prescription.   guaiFENesin 100 MG/5ML Soln Commonly known as: ROBITUSSIN Take 15 mLs (300 mg total) by mouth every 6 (six)  hours.   ipratropium-albuterol 0.5-2.5 (3) MG/3ML Soln Commonly known as: DUONEB Take 3 mLs by nebulization every 6 (six) hours as needed. What changed: reasons to take this   magnesium oxide 400 MG tablet Commonly known as: MAG-OX Take 400 mg by mouth 3 (three) times daily.   metoprolol tartrate 25 MG tablet Commonly known as: LOPRESSOR Take 25 mg by mouth every 8 (eight) hours.   ondansetron 4 MG tablet Commonly known as: ZOFRAN Take 1 tablet (4 mg total) by mouth every 6 (six) hours as needed for nausea. What changed: when to take this   polyethylene glycol 17 g packet Commonly known as: MIRALAX / GLYCOLAX Follow package instruction for daily  use.  You need to have one soft bowel movement per day.  If he is not doing this call your primary care doctor.  You also need to be sure he takes in 1.5 liters of fluid per day.   He needs protein supplement daily, and needs to take in about 1300 calories per day. What changed:   how much to take  how to take this  when to take this   scopolamine 1 MG/3DAYS Commonly known as: TRANSDERM-SCOP Place 1 patch (1.5 mg total) onto the skin every 3 (three) days.   sterile water SOLN with amino acids 10 % SOLN 1.3 g/kg, dextrose 70 % SOLN 20 % Inject 1,000 mLs into the vein continuous. TPN 1000 ml w/ lipids  Directions: Infuse TPN 1020m IV through PICC line via CADD SOLIS infusion pump daily over 18 hours with 1 hr ramp up and 1 hr ramp down. Add MVI 10 ml as directed to each bag prior to infusion. MIX WELL VTBI: 1000 ML Bag Volume  1050 ml     Dosing Weight for the patient 72 lbs            Discharge Care Instructions  (From admission, onward)         Start     Ordered   08/27/20 0000  Change dressing on IV access line weekly and PRN  (Home infusion instructions - [facility] )        08/27/20 0911   08/27/20 0000  Discharge wound care:       Comments: Per above   08/27/20 0911          Allergies  Allergen Reactions  . Fentanyl Other (See Comments)    Dizziness    Consultations:  Infectious disease     Procedures/Studies: CT Chest W Contrast  Result Date: 08/21/2020 CLINICAL DATA:  Pneumothorax.  Altered mental status. EXAM: CT CHEST, ABDOMEN, AND PELVIS WITH CONTRAST TECHNIQUE: Multidetector CT imaging of the chest, abdomen and pelvis was performed following the standard protocol during bolus administration of intravenous contrast. CONTRAST:  1070mOMNIPAQUE IOHEXOL 300 MG/ML  SOLN COMPARISON:  Abdomen/pelvis CT 06/15/2020.  CTA chest 05/16/2018. FINDINGS: CT CHEST FINDINGS Cardiovascular: The heart size is normal. No substantial pericardial effusion. No  thoracic aortic aneurysm. Left PICC line tip is in the inferior right atrium. Mediastinum/Nodes: Stable appearance thymic remnant anterior mediastinum. No mediastinal lymphadenopathy. There is no hilar lymphadenopathy. The esophagus has normal imaging features. There is no axillary lymphadenopathy. Lungs/Pleura: Small left-sided pneumothorax noted. Small right pleural effusion is associated with mild nodular ground-glass opacity in the inferior right lower lobe. Musculoskeletal: Thoracolumbar scoliosis. Diffuse loss of muscle volume consistent with history of muscular dystrophy. No worrisome lytic or sclerotic osseous abnormality. CT ABDOMEN PELVIS FINDINGS Hepatobiliary: No suspicious focal abnormality  within the liver parenchyma. There is no evidence for gallstones, gallbladder wall thickening, or pericholecystic fluid. No intrahepatic or extrahepatic biliary dilation. Pancreas: No focal mass lesion. No dilatation of the main duct. No intraparenchymal cyst. No peripancreatic edema. Spleen: No splenomegaly. No focal mass lesion. Adrenals/Urinary Tract: No adrenal nodule or mass. Tiny nonobstructing stone noted interpolar right kidney. Left kidney unremarkable. No evidence for hydroureter. The urinary bladder appears normal for the degree of distention. Stomach/Bowel: Stomach is unremarkable. No gastric wall thickening. No evidence of outlet obstruction. Diffuse gaseous distention of small bowel and colon noted with left abdominal end colostomy. The colon tracking to the stoma is decompressed but does contain some fluid raising the possibility of diarrhea. Vascular/Lymphatic: No abdominal aortic aneurysm. There is no gastrohepatic or hepatoduodenal ligament lymphadenopathy. No retroperitoneal or mesenteric lymphadenopathy. No pelvic sidewall lymphadenopathy. Reproductive: The prostate gland and seminal vesicles are unremarkable. Other: No intraperitoneal free fluid. Musculoskeletal: Bones are diffusely  demineralized. No worrisome lytic or sclerotic osseous abnormality. IMPRESSION: 1. Small left-sided pneumothorax. 2. Small right pleural effusion associated with mild nodular ground-glass opacity in the inferior right lower lobe. Infectious/inflammatory alveolitis likely. 3. Diffuse gaseous distention of small bowel and colon with left abdominal end colostomy. No abrupt colonic transition zone and the colon tracking to the stoma is decompressed but contains some fluid raising the possibility of diarrhea. 4. Tiny nonobstructing right renal stone. Electronically Signed   By: Misty Stanley M.D.   On: 08/21/2020 10:36   CT ABDOMEN PELVIS W CONTRAST  Result Date: 08/21/2020 CLINICAL DATA:  Pneumothorax.  Altered mental status. EXAM: CT CHEST, ABDOMEN, AND PELVIS WITH CONTRAST TECHNIQUE: Multidetector CT imaging of the chest, abdomen and pelvis was performed following the standard protocol during bolus administration of intravenous contrast. CONTRAST:  121m OMNIPAQUE IOHEXOL 300 MG/ML  SOLN COMPARISON:  Abdomen/pelvis CT 06/15/2020.  CTA chest 05/16/2018. FINDINGS: CT CHEST FINDINGS Cardiovascular: The heart size is normal. No substantial pericardial effusion. No thoracic aortic aneurysm. Left PICC line tip is in the inferior right atrium. Mediastinum/Nodes: Stable appearance thymic remnant anterior mediastinum. No mediastinal lymphadenopathy. There is no hilar lymphadenopathy. The esophagus has normal imaging features. There is no axillary lymphadenopathy. Lungs/Pleura: Small left-sided pneumothorax noted. Small right pleural effusion is associated with mild nodular ground-glass opacity in the inferior right lower lobe. Musculoskeletal: Thoracolumbar scoliosis. Diffuse loss of muscle volume consistent with history of muscular dystrophy. No worrisome lytic or sclerotic osseous abnormality. CT ABDOMEN PELVIS FINDINGS Hepatobiliary: No suspicious focal abnormality within the liver parenchyma. There is no evidence for  gallstones, gallbladder wall thickening, or pericholecystic fluid. No intrahepatic or extrahepatic biliary dilation. Pancreas: No focal mass lesion. No dilatation of the main duct. No intraparenchymal cyst. No peripancreatic edema. Spleen: No splenomegaly. No focal mass lesion. Adrenals/Urinary Tract: No adrenal nodule or mass. Tiny nonobstructing stone noted interpolar right kidney. Left kidney unremarkable. No evidence for hydroureter. The urinary bladder appears normal for the degree of distention. Stomach/Bowel: Stomach is unremarkable. No gastric wall thickening. No evidence of outlet obstruction. Diffuse gaseous distention of small bowel and colon noted with left abdominal end colostomy. The colon tracking to the stoma is decompressed but does contain some fluid raising the possibility of diarrhea. Vascular/Lymphatic: No abdominal aortic aneurysm. There is no gastrohepatic or hepatoduodenal ligament lymphadenopathy. No retroperitoneal or mesenteric lymphadenopathy. No pelvic sidewall lymphadenopathy. Reproductive: The prostate gland and seminal vesicles are unremarkable. Other: No intraperitoneal free fluid. Musculoskeletal: Bones are diffusely demineralized. No worrisome lytic or sclerotic osseous abnormality. IMPRESSION: 1. Small  left-sided pneumothorax. 2. Small right pleural effusion associated with mild nodular ground-glass opacity in the inferior right lower lobe. Infectious/inflammatory alveolitis likely. 3. Diffuse gaseous distention of small bowel and colon with left abdominal end colostomy. No abrupt colonic transition zone and the colon tracking to the stoma is decompressed but contains some fluid raising the possibility of diarrhea. 4. Tiny nonobstructing right renal stone. Electronically Signed   By: Misty Stanley M.D.   On: 08/21/2020 10:36   DG CHEST PORT 1 VIEW  Result Date: 08/26/2020 CLINICAL DATA:  PICC placement. EXAM: PORTABLE CHEST 1 VIEW COMPARISON:  08/24/2020 FINDINGS: The patient  is rotated to the right. A left PICC has been placed and terminates over the mid to lower right atrium. The cardiomediastinal silhouette is unchanged. Hazy right midlung opacity is unchanged and likely relates to known pleural effusion. There is new mild opacity in the left lung base. There is a persistent tiny left apical pneumothorax. There is persistent gaseous dilatation of bowel loops in the upper abdomen. IMPRESSION: 1. Left PICC terminates over the mid to lower right atrium. Withdrawal by 4.5 cm would place the tip near the superior cavoatrial junction. 2. Unchanged tiny left apical pneumothorax. 3. New mild left basilar opacity, likely atelectasis. 4. Stable hazy right lung opacity likely related to pleural fluid. Electronically Signed   By: Logan Bores M.D.   On: 08/26/2020 13:09   DG CHEST PORT 1 VIEW  Result Date: 08/24/2020 CLINICAL DATA:  Pneumothorax, pleural effusion EXAM: PORTABLE CHEST 1 VIEW COMPARISON:  08/23/2020 FINDINGS: Lung volumes are small, but are symmetric. Wedge like opacity within the right mid lung zone likely represents fluid within the fissure and is unchanged. Tiny left apical pneumothorax is again identified, slightly decreased in size since prior examination. No superimposed focal pulmonary infiltrate. No pneumothorax or dependent pleural effusion. Cardiac size within normal limits. Pulmonary vascularity is normal. IMPRESSION: Tiny left apical pneumothorax, decreased. Stable wedge like opacity within the right mid lung zone likely representing fluid within the fissure. Electronically Signed   By: Fidela Salisbury MD   On: 08/24/2020 05:51   DG CHEST PORT 1 VIEW  Result Date: 08/23/2020 CLINICAL DATA:  Pneumothorax. EXAM: PORTABLE CHEST 1 VIEW COMPARISON:  Chest x-ray 08/22/2020.  CT chest 08/21/2020. FINDINGS: Mediastinum and hilar structures are normal. Heart size normal. Persistent atelectatic changes right mid lung. Persistent right pleural effusion. Tiny left pleural  effusion cannot be excluded. Persistent small left apical pneumothorax. No acute bony abnormality identified. IMPRESSION: 1. Persistent small left apical pneumothorax.  No interim change. 2. Persistent atelectatic changes right mid lung. Persistent right pleural effusion. Tiny left pleural effusion cannot be excluded. Electronically Signed   By: Marcello Moores  Register   On: 08/23/2020 06:57   DG CHEST PORT 1 VIEW  Result Date: 08/22/2020 CLINICAL DATA:  Pneumothorax EXAM: PORTABLE CHEST 1 VIEW COMPARISON:  CT from yesterday FINDINGS: Hazy opacity over the right upper chest correlating with pleural fluid on CT yesterday. Small left apical pneumothorax, 14 mm craniocaudal, unchanged. Lung volumes remain low. Normal heart size. IMPRESSION: 1. Unchanged small left apical pneumothorax. 2. Unchanged hazy right chest opacification correlating with pleural fluid by CT. Electronically Signed   By: Monte Fantasia M.D.   On: 08/22/2020 04:40   DG Chest Port 1 View  Result Date: 08/21/2020 CLINICAL DATA:  Hypoglycemia and altered mental status EXAM: PORTABLE CHEST 1 VIEW COMPARISON:  June 19, 2020 and Feb 08, 2020 FINDINGS: Apparent scarring right upper lobe is again noted. Lungs  elsewhere are clear. Heart size and pulmonary vascularity are normal. No adenopathy. Central catheter tip is in the right atrium. There is a focal apical pneumothorax without tension component. No bone lesions. IMPRESSION: Left apical pneumothorax without tension component. Central catheter tip in right atrium. Apparent scarring right upper lobe. No consolidation or edema. Heart size normal. Critical Value/emergent results were called by telephone at the time of interpretation on 08/21/2020 at 8:37 am to provider [facility] , who verbally acknowledged these results. Electronically Signed   By: Lowella Grip III M.D.   On: 08/21/2020 08:37   DG Abdomen Acute W/Chest  Result Date: 08/20/2020 CLINICAL DATA:  Fever, abdominal  distension, nausea EXAM: DG ABDOMEN ACUTE WITH 1 VIEW CHEST COMPARISON:  08/27/2018 FINDINGS: Supine and upright frontal views of the abdomen as well as an upright frontal view of the chest are obtained. Left-sided PICC tip overlies superior vena cava. The cardiac silhouette is stable. Right suprahilar airspace disease compatible with bronchopneumonia. No effusion or pneumothorax. Diffuse gaseous distention of bowel without obstruction or ileus. No masses or abnormal calcifications. No free gas in the greater peritoneal sac. IMPRESSION: 1. Right suprahilar airspace disease consistent with bronchopneumonia. 2. Diffuse gaseous distension of large and small bowel, without signs of obstruction or ileus. Electronically Signed   By: Randa Ngo M.D.   On: 08/20/2020 15:56   ECHOCARDIOGRAM COMPLETE  Result Date: 08/21/2020    ECHOCARDIOGRAM REPORT   Patient Name:   DEMARCO BACCI Date of Exam: 08/21/2020 Medical Rec #:  [id number]  Height:       62.0 in Accession #:    [id number] Weight:       90.0 lb Date of Birth:  12/01/1995  BSA:          1.362 m Patient Age:    24 years   BP:           98/56 mmHg Patient Gender: M          HR:           95 bpm. Exam Location:  Inpatient Procedure: 2D Echo, Cardiac Doppler and Color Doppler                         STAT ECHO Reported to: Dr. Kirk Ruths on 08/21/2020 3:18:00 PM. Indications:    Bacteremia 790.7 / R78.81  History:        Patient has prior history of Echocardiogram examinations, most                 recent 06/17/2020. Sepsis. Hypothyroidism.  Sonographer:    Tiffany Dance Referring Phys: [id number] Harold Hedge  Sonographer Comments: No subcostal window. Image acquisition challenging due to patient body habitus. IMPRESSIONS  1. Left ventricular ejection fraction, by estimation, is 60 to 65%. The left ventricle has normal function. The left ventricle has no regional wall motion abnormalities. Left ventricular diastolic parameters were normal.  2. Right ventricular  systolic function is normal. The right ventricular size is normal. There is normal pulmonary artery systolic pressure. The estimated right ventricular systolic pressure is 27.7 mmHg.  3. There is a linear echodensity in the right atrium prolapsing across the tricuspid valve that is likely an intravenous catheter, not a vegetation. It does not appear to be attached to the tricuspid valve, but probably arises from the superior vena cava.  4. The mitral valve is normal in structure. No evidence of mitral valve regurgitation. No evidence of mitral stenosis.  5. The aortic valve is normal in structure. Aortic valve regurgitation is not visualized. No aortic stenosis is present.  6. The inferior vena cava is normal in size with greater than 50% respiratory variability, suggesting right atrial pressure of 3 mmHg. Conclusion(s)/Recommendation(s): No evidence of valvular vegetations on this transthoracic echocardiogram. Would recommend a transesophageal echocardiogram to exclude infective endocarditis if clinically indicated. FINDINGS  Left Ventricle: Left ventricular ejection fraction, by estimation, is 60 to 65%. The left ventricle has normal function. The left ventricle has no regional wall motion abnormalities. The left ventricular internal cavity size was normal in size. There is  no left ventricular hypertrophy. Left ventricular diastolic parameters were normal. Normal left ventricular filling pressure. Right Ventricle: The right ventricular size is normal. No increase in right ventricular wall thickness. Right ventricular systolic function is normal. There is normal pulmonary artery systolic pressure. The tricuspid regurgitant velocity is 2.11 m/s, and  with an assumed right atrial pressure of 8 mmHg, the estimated right ventricular systolic pressure is 26.7 mmHg. Left Atrium: Left atrial size was normal in size. Right Atrium: There is a linear echodensity in the right atrium prolapsing across the tricuspid valve that  is likely an intravenous catheter, not a vegetation. It does not appear to be attached to the tricuspid valve, but probably arises from the superior vena cava. Right atrial size was normal in size. Pericardium: There is no evidence of pericardial effusion. Mitral Valve: The mitral valve is normal in structure. No evidence of mitral valve regurgitation. No evidence of mitral valve stenosis. Tricuspid Valve: The tricuspid valve is normal in structure. Tricuspid valve regurgitation is mild . No evidence of tricuspid stenosis. Aortic Valve: The aortic valve is normal in structure. Aortic valve regurgitation is not visualized. No aortic stenosis is present. Pulmonic Valve: The pulmonic valve was normal in structure. Pulmonic valve regurgitation is not visualized. No evidence of pulmonic stenosis. Aorta: The aortic root is normal in size and structure. Venous: The inferior vena cava is normal in size with greater than 50% respiratory variability, suggesting right atrial pressure of 3 mmHg. IAS/Shunts: No atrial level shunt detected by color flow Doppler.  LEFT VENTRICLE PLAX 2D LVIDd:         3.70 cm  Diastology LVIDs:         2.70 cm  LV e' medial:    8.81 cm/s LV PW:         0.80 cm  LV E/e' medial:  10.1 LV IVS:        0.70 cm  LV e' lateral:   9.90 cm/s LVOT diam:     1.70 cm  LV E/e' lateral: 8.9 LV SV:         28 LV SV Index:   20 LVOT Area:     2.27 cm  RIGHT VENTRICLE RV Basal diam:  2.60 cm RV S prime:     9.14 cm/s TAPSE (M-mode): 1.0 cm LEFT ATRIUM           Index       RIGHT ATRIUM          Index LA diam:      3.10 cm 2.28 cm/m  RA Area:     6.39 cm LA Vol (A2C): 22.0 ml 16.16 ml/m RA Volume:   11.20 ml 8.23 ml/m LA Vol (A4C): 22.7 ml 16.67 ml/m  AORTIC VALVE LVOT Vmax:   70.10 cm/s LVOT Vmean:  49.000 cm/s LVOT VTI:    0.122 m  AORTA Ao  Root diam: 2.30 cm Ao Asc diam:  2.00 cm MITRAL VALVE               TRICUSPID VALVE MV Area (PHT): 3.31 cm    TR Peak grad:   17.8 mmHg MV Decel Time: 229 msec    TR  Vmax:        211.00 cm/s MV E velocity: 88.60 cm/s MV A velocity: 83.90 cm/s  SHUNTS MV E/A ratio:  1.06        Systemic VTI:  0.12 m                            Systemic Diam: 1.70 cm Dani Gobble Croitoru MD Electronically signed by Sanda Klein MD Signature Date/Time: 08/21/2020/3:29:58 PM    Final    [place] EKG SITE RITE  Result Date: 08/26/2020 If Site Rite image not attached, placement could not be confirmed due to current cardiac rhythm.  US Abdomen Limited RUQ (LIVER/GB)  Result Date: 08/21/2020 CLINICAL DATA:  Abnormal liver function tests. EXAM: ULTRASOUND ABDOMEN LIMITED RIGHT UPPER QUADRANT COMPARISON:  CT abdomen and pelvis 08/21/2020. Right upper quadrant abdominal ultrasound 06/14/2020. FINDINGS: Gallbladder: No gallstones or wall thickening visualized. No sonographic Murphy sign noted by sonographer. Common bile duct: Diameter: 4 mm Liver: Within normal limits in parenchymal echogenicity. No focal liver lesion is identified, however the left lobe was largely obscured by bowel gas and rib shadows. Portal vein is patent on color Doppler imaging with normal direction of blood flow towards the liver. Other: None. IMPRESSION: Poor visualization of the left hepatic lobe, otherwise unremarkable right upper quadrant ultrasound. Electronically Signed   By: Logan Bores M.D.   On: 08/21/2020 13:52    (Echo, Carotid, EGD, Colonoscopy, ERCP)    Subjective:  No new complaints feels great. Discharge Exam: Vitals:   08/27/20 0430 08/27/20 0830  BP:    Pulse: (!) 59   Resp: 20   Temp:    SpO2: 100% 100%   Vitals:   08/27/20 0017 08/27/20 0419 08/27/20 0430 08/27/20 0830  BP:  (!) 99/59    Pulse: (!) 58  (!) 59   Resp: 19  20   Temp:  97.6 F (36.4 C)    TempSrc:  Axillary    SpO2: 99%  100% 100%  Weight:      Height:        General: Pt is alert, awake, not in acute distress Cardiovascular: RRR, S1/S2 +, no rubs, no gallops Respiratory: CTA bilaterally, no wheezing, no  rhonchi Abdominal: Soft, NT, ND, bowel sounds + Extremities: no edema, no cyanosis    The results of significant diagnostics from this hospitalization (including imaging, microbiology, ancillary and laboratory) are listed below for reference.     Microbiology: Recent Results (from the past 240 hour(s))  Culture, blood (routine x 2)     Status: Abnormal   Collection Time: 08/20/20  4:10 PM   Specimen: BLOOD LEFT ARM  Result Value Ref Range Status   Specimen Description   Final    BLOOD LEFT ARM UPPER PICC Performed at [facility], [address] [place], [place] [address]    Special Requests   Final    BOTTLES DRAWN AEROBIC AND ANAEROBIC Blood Culture adequate volume Performed at [facility], [address] [place], [place] [address]    Culture  Setup Time   Final    GRAM POSITIVE COCCI IN BOTH  AEROBIC AND ANAEROBIC BOTTLES Organism ID to follow CRITICAL RESULT CALLED TO, READ BACK BY AND VERIFIED WITH: PHRMD M LILLISON '@[id number]'  08/21/20 BY S GEZAHEGN Performed at [facility], [place] [address], [place], [place] [address]    Culture STAPHYLOCOCCUS AUREUS (A)  Final   Report Status 08/23/2020 FINAL  Final   Organism ID, Bacteria STAPHYLOCOCCUS AUREUS  Final      Susceptibility   Staphylococcus aureus - MIC*    CIPROFLOXACIN <=0.5 SENSITIVE Sensitive     ERYTHROMYCIN <=0.25 SENSITIVE Sensitive     GENTAMICIN <=0.5 SENSITIVE Sensitive     OXACILLIN 0.5 SENSITIVE Sensitive     TETRACYCLINE <=1 SENSITIVE Sensitive     VANCOMYCIN 1 SENSITIVE Sensitive     TRIMETH/SULFA <=10 SENSITIVE Sensitive     CLINDAMYCIN <=0.25 SENSITIVE Sensitive     RIFAMPIN <=0.5 SENSITIVE Sensitive     Inducible Clindamycin NEGATIVE Sensitive     * STAPHYLOCOCCUS AUREUS  Blood Culture ID Panel (Reflexed)     Status: Abnormal   Collection Time: 08/20/20  4:10 PM  Result Value Ref Range Status   Enterococcus faecalis NOT DETECTED NOT DETECTED Final   Enterococcus Faecium  NOT DETECTED NOT DETECTED Final   Listeria monocytogenes NOT DETECTED NOT DETECTED Final   Staphylococcus species DETECTED (A) NOT DETECTED Final    Comment: CRITICAL RESULT CALLED TO, READ BACK BY AND VERIFIED WITH: PHRMD M LILLISON '@[id number]'  08/21/20 BY S GEZAHEGN    Staphylococcus aureus (BCID) DETECTED (A) NOT DETECTED Final    Comment: CRITICAL RESULT CALLED TO, READ BACK BY AND VERIFIED WITH: PHRMD M LILLISON '@[id number]'  08/21/20 BY S GEZAHEGN    Staphylococcus epidermidis NOT DETECTED NOT DETECTED Final   Staphylococcus lugdunensis NOT DETECTED NOT DETECTED Final   Streptococcus species NOT DETECTED NOT DETECTED Final   Streptococcus agalactiae NOT DETECTED NOT DETECTED Final   Streptococcus pneumoniae NOT DETECTED NOT DETECTED Final   Streptococcus pyogenes NOT DETECTED NOT DETECTED Final   A.calcoaceticus-baumannii NOT DETECTED NOT DETECTED Final   Bacteroides fragilis NOT DETECTED NOT DETECTED Final   Enterobacterales NOT DETECTED NOT DETECTED Final   Enterobacter cloacae complex NOT DETECTED NOT DETECTED Final   Escherichia coli NOT DETECTED NOT DETECTED Final   Klebsiella aerogenes NOT DETECTED NOT DETECTED Final   Klebsiella oxytoca NOT DETECTED NOT DETECTED Final   Klebsiella pneumoniae NOT DETECTED NOT DETECTED Final   Proteus species NOT DETECTED NOT DETECTED Final   Salmonella species NOT DETECTED NOT DETECTED Final   Serratia marcescens NOT DETECTED NOT DETECTED Final   Haemophilus influenzae NOT DETECTED NOT DETECTED Final   Neisseria meningitidis NOT DETECTED NOT DETECTED Final   Pseudomonas aeruginosa NOT DETECTED NOT DETECTED Final   Stenotrophomonas maltophilia NOT DETECTED NOT DETECTED Final   Candida albicans NOT DETECTED NOT DETECTED Final   Candida auris NOT DETECTED NOT DETECTED Final   Candida glabrata NOT DETECTED NOT DETECTED Final   Candida krusei NOT DETECTED NOT DETECTED Final   Candida parapsilosis NOT DETECTED NOT DETECTED Final   Candida tropicalis NOT  DETECTED NOT DETECTED Final   Cryptococcus neoformans/gattii NOT DETECTED NOT DETECTED Final   Meth resistant mecA/C and MREJ NOT DETECTED NOT DETECTED Final    Comment: Performed at [facility], [address] [place], [place] [address]  Respiratory Panel by RT PCR (Flu A&B, Covid) - Nasopharyngeal Swab     Status: None   Collection Time: 08/20/20  4:16 PM   Specimen: Nasopharyngeal Swab  Result Value Ref Range Status  SARS Coronavirus 2 by RT PCR NEGATIVE NEGATIVE Final    Comment: (NOTE) SARS-CoV-2 target nucleic acids are NOT DETECTED.  The SARS-CoV-2 RNA is generally detectable in upper respiratoy specimens during the acute phase of infection. The lowest concentration of SARS-CoV-2 viral copies this assay can detect is 131 copies/mL. A negative result does not preclude SARS-Cov-2 infection and should not be used as the sole basis for treatment or other patient management decisions. A negative result may occur with  improper specimen collection/handling, submission of specimen other than nasopharyngeal swab, presence of viral mutation(s) within the areas targeted by this assay, and inadequate number of viral copies (<131 copies/mL). A negative result must be combined with clinical observations, patient history, and epidemiological information. The expected result is Negative.  Fact Sheet for Patients:  PinkCheek.be  Fact Sheet for Healthcare Providers:  GravelBags.it  This test is no t yet approved or cleared by the [place] FDA and  has been authorized for detection and/or diagnosis of SARS-CoV-2 by FDA under an Emergency Use Authorization (EUA). This EUA will remain  in effect (meaning this test can be used) for the duration of the COVID-19 declaration under Section 564(b)(1) of the Act, 21 U.S.C. section 360bbb-3(b)(1), unless the authorization is terminated or revoked sooner.     Influenza A by PCR  NEGATIVE NEGATIVE Final   Influenza B by PCR NEGATIVE NEGATIVE Final    Comment: (NOTE) The Xpert Xpress SARS-CoV-2/FLU/RSV assay is intended as an aid in  the diagnosis of influenza from Nasopharyngeal swab specimens and  should not be used as a sole basis for treatment. Nasal washings and  aspirates are unacceptable for Xpert Xpress SARS-CoV-2/FLU/RSV  testing.  Fact Sheet for Patients: PinkCheek.be  Fact Sheet for Healthcare Providers: GravelBags.it  This test is not yet approved or cleared by the [place] FDA and  has been authorized for detection and/or diagnosis of SARS-CoV-2 by  FDA under an Emergency Use Authorization (EUA). This EUA will remain  in effect (meaning this test can be used) for the duration of the  Covid-19 declaration under Section 564(b)(1) of the Act, 21  U.S.C. section 360bbb-3(b)(1), unless the authorization is  terminated or revoked. Performed at [facility], [address] [place], [place] [address]   Urine culture     Status: Abnormal   Collection Time: 08/20/20  7:36 PM   Specimen: Urine, Random  Result Value Ref Range Status   Specimen Description   Final    URINE, RANDOM Performed at [facility], [address] [place], [place] [address]    Special Requests   Final    NONE Performed at [facility], [address] [place], [place] [address]    Culture (A)  Final    60,000 COLONIES/mL ESCHERICHIA COLI 30,000 COLONIES/mL ENTEROCOCCUS FAECALIS    Report Status 08/23/2020 FINAL  Final   Organism ID, Bacteria ESCHERICHIA COLI (A)  Final   Organism ID, Bacteria ENTEROCOCCUS FAECALIS (A)  Final      Susceptibility   Escherichia coli - MIC*    AMPICILLIN >=32 RESISTANT Resistant     CEFAZOLIN <=4 SENSITIVE Sensitive     CEFEPIME <=0.12 SENSITIVE Sensitive     CEFTRIAXONE <=0.25 SENSITIVE Sensitive     CIPROFLOXACIN >=4 RESISTANT Resistant      GENTAMICIN <=1 SENSITIVE Sensitive     IMIPENEM <=0.25 SENSITIVE Sensitive     NITROFURANTOIN <=16 SENSITIVE Sensitive     TRIMETH/SULFA <=20  SENSITIVE Sensitive     AMPICILLIN/SULBACTAM >=32 RESISTANT Resistant     PIP/TAZO <=4 SENSITIVE Sensitive     * 60,000 COLONIES/mL ESCHERICHIA COLI   Enterococcus faecalis - MIC*    AMPICILLIN <=2 SENSITIVE Sensitive     NITROFURANTOIN <=16 SENSITIVE Sensitive     VANCOMYCIN 1 SENSITIVE Sensitive     * 30,000 COLONIES/mL ENTEROCOCCUS FAECALIS  Urine culture     Status: Abnormal   Collection Time: 08/21/20  8:14 AM   Specimen: In/Out Cath Urine  Result Value Ref Range Status   Specimen Description   Final    IN/OUT CATH URINE Performed at [facility] [address] [place], [place] [address]    Special Requests   Final    URINE, RANDOM Performed at [facility] [address], [place], [place] [address]    Culture (A)  Final    20,000 COLONIES/mL ESCHERICHIA COLI 20,000 COLONIES/mL ENTEROCOCCUS FAECALIS    Report Status 08/23/2020 FINAL  Final   Organism ID, Bacteria ESCHERICHIA COLI (A)  Final   Organism ID, Bacteria ENTEROCOCCUS FAECALIS (A)  Final      Susceptibility   Escherichia coli - MIC*    AMPICILLIN >=32 RESISTANT Resistant     CEFAZOLIN 16 SENSITIVE Sensitive     CEFEPIME <=0.12 SENSITIVE Sensitive     CEFTRIAXONE <=0.25 SENSITIVE Sensitive     CIPROFLOXACIN >=4 RESISTANT Resistant     GENTAMICIN <=1 SENSITIVE Sensitive     IMIPENEM <=0.25 SENSITIVE Sensitive     NITROFURANTOIN <=16 SENSITIVE Sensitive     TRIMETH/SULFA <=20 SENSITIVE Sensitive     AMPICILLIN/SULBACTAM >=32 RESISTANT Resistant     PIP/TAZO <=4 SENSITIVE Sensitive     * 20,000 COLONIES/mL ESCHERICHIA COLI   Enterococcus faecalis - MIC*    AMPICILLIN <=2 SENSITIVE Sensitive     NITROFURANTOIN <=16 SENSITIVE Sensitive     VANCOMYCIN 1 SENSITIVE Sensitive     * 20,000 COLONIES/mL ENTEROCOCCUS FAECALIS  Blood  culture (routine x 2)     Status: Abnormal   Collection Time: 08/21/20  8:14 AM   Specimen: BLOOD RIGHT ARM  Result Value Ref Range Status   Specimen Description   Final    BLOOD RIGHT ARM Performed at [facility], [address], [place], [place] [address]    Special Requests   Final    BOTTLES DRAWN AEROBIC ONLY Blood Culture results may not be optimal due to an inadequate volume of blood received in culture bottles Performed at [facility] [address] [place], [place] [address]    Culture  Setup Time   Final    GRAM POSITIVE COCCI IN CLUSTERS AEROBIC BOTTLE ONLY Organism ID to follow CRITICAL RESULT CALLED TO, READ BACK BY AND VERIFIED WITH: Barth Kirks [facility] [id number] 08/22/20 A BROWNING Performed at [facility], [address] [place], [place] [address]    Culture STAPHYLOCOCCUS AUREUS (A)  Final   Report Status 08/24/2020 FINAL  Final   Organism ID, Bacteria STAPHYLOCOCCUS AUREUS  Final      Susceptibility   Staphylococcus aureus - MIC*    CIPROFLOXACIN <=0.5 SENSITIVE Sensitive     ERYTHROMYCIN <=0.25 SENSITIVE Sensitive     GENTAMICIN <=0.5 SENSITIVE Sensitive     OXACILLIN <=0.25 SENSITIVE Sensitive     TETRACYCLINE <=1 SENSITIVE Sensitive     VANCOMYCIN 1 SENSITIVE Sensitive     TRIMETH/SULFA <=10 SENSITIVE Sensitive     CLINDAMYCIN <=0.25 SENSITIVE Sensitive  RIFAMPIN <=0.5 SENSITIVE Sensitive     Inducible Clindamycin NEGATIVE Sensitive     * STAPHYLOCOCCUS AUREUS  Blood Culture ID Panel (Reflexed)     Status: Abnormal   Collection Time: 08/21/20  8:14 AM  Result Value Ref Range Status   Enterococcus faecalis NOT DETECTED NOT DETECTED Final   Enterococcus Faecium NOT DETECTED NOT DETECTED Final   Listeria monocytogenes NOT DETECTED NOT DETECTED Final   Staphylococcus species DETECTED (A) NOT DETECTED Final    Comment: CRITICAL RESULT CALLED TO, READ BACK BY AND VERIFIED WITH: Barth Kirks PHARMD 1911 08/22/20 A BROWNING     Staphylococcus aureus (BCID) DETECTED (A) NOT DETECTED Final    Comment: CRITICAL RESULT CALLED TO, READ BACK BY AND VERIFIED WITH: Barth Kirks PHARMD 1911 08/22/20 A BROWNING    Staphylococcus epidermidis NOT DETECTED NOT DETECTED Final   Staphylococcus lugdunensis NOT DETECTED NOT DETECTED Final   Streptococcus species NOT DETECTED NOT DETECTED Final   Streptococcus agalactiae NOT DETECTED NOT DETECTED Final   Streptococcus pneumoniae NOT DETECTED NOT DETECTED Final   Streptococcus pyogenes NOT DETECTED NOT DETECTED Final   A.calcoaceticus-baumannii NOT DETECTED NOT DETECTED Final   Bacteroides fragilis NOT DETECTED NOT DETECTED Final   Enterobacterales NOT DETECTED NOT DETECTED Final   Enterobacter cloacae complex NOT DETECTED NOT DETECTED Final   Escherichia coli NOT DETECTED NOT DETECTED Final   Klebsiella aerogenes NOT DETECTED NOT DETECTED Final   Klebsiella oxytoca NOT DETECTED NOT DETECTED Final   Klebsiella pneumoniae NOT DETECTED NOT DETECTED Final   Proteus species NOT DETECTED NOT DETECTED Final   Salmonella species NOT DETECTED NOT DETECTED Final   Serratia marcescens NOT DETECTED NOT DETECTED Final   Haemophilus influenzae NOT DETECTED NOT DETECTED Final   Neisseria meningitidis NOT DETECTED NOT DETECTED Final   Pseudomonas aeruginosa NOT DETECTED NOT DETECTED Final   Stenotrophomonas maltophilia NOT DETECTED NOT DETECTED Final   Candida albicans NOT DETECTED NOT DETECTED Final   Candida auris NOT DETECTED NOT DETECTED Final   Candida glabrata NOT DETECTED NOT DETECTED Final   Candida krusei NOT DETECTED NOT DETECTED Final   Candida parapsilosis NOT DETECTED NOT DETECTED Final   Candida tropicalis NOT DETECTED NOT DETECTED Final   Cryptococcus neoformans/gattii NOT DETECTED NOT DETECTED Final   Meth resistant mecA/C and MREJ NOT DETECTED NOT DETECTED Final    Comment: Performed at [facility], [address], [place], [place] [address]  Respiratory Panel by RT  PCR (Flu A&B, Covid) - Nasopharyngeal Swab     Status: None   Collection Time: 08/21/20  8:27 AM   Specimen: Nasopharyngeal Swab  Result Value Ref Range Status   SARS Coronavirus 2 by RT PCR NEGATIVE NEGATIVE Final    Comment: (NOTE) SARS-CoV-2 target nucleic acids are NOT DETECTED.  The SARS-CoV-2 RNA is generally detectable in upper respiratoy specimens during the acute phase of infection. The lowest concentration of SARS-CoV-2 viral copies this assay can detect is 131 copies/mL. A negative result does not preclude SARS-Cov-2 infection and should not be used as the sole basis for treatment or other patient management decisions. A negative result may occur with  improper specimen collection/handling, submission of specimen other than nasopharyngeal swab, presence of viral mutation(s) within the areas targeted by this assay, and inadequate number of viral copies (<131 copies/mL). A negative result must be combined with clinical observations, patient history, and epidemiological information. The expected result is Negative.  Fact Sheet for Patients:  PinkCheek.be  Fact Sheet for Healthcare Providers:  GravelBags.it  This test is no t yet approved or cleared by the [place] and  has been authorized for detection and/or diagnosis of SARS-CoV-2 by FDA under an Emergency Use Authorization (EUA). This EUA will remain  in effect (meaning this test can be used) for the duration of the COVID-19 declaration under Section 564(b)(1) of the Act, 21 U.S.C. section 360bbb-3(b)(1), unless the authorization is terminated or revoked sooner.     Influenza A by PCR NEGATIVE NEGATIVE Final   Influenza B by PCR NEGATIVE NEGATIVE Final    Comment: (NOTE) The Xpert Xpress SARS-CoV-2/FLU/RSV assay is intended as an aid in  the diagnosis of influenza from Nasopharyngeal swab specimens and  should not be used as a sole basis for  treatment. Nasal washings and  aspirates are unacceptable for Xpert Xpress SARS-CoV-2/FLU/RSV  testing.  Fact Sheet for Patients: PinkCheek.be  Fact Sheet for Healthcare Providers: GravelBags.it  This test is not yet approved or cleared by the [place] FDA and  has been authorized for detection and/or diagnosis of SARS-CoV-2 by  FDA under an Emergency Use Authorization (EUA). This EUA will remain  in effect (meaning this test can be used) for the duration of the  Covid-19 declaration under Section 564(b)(1) of the Act, 21  U.S.C. section 360bbb-3(b)(1), unless the authorization is  terminated or revoked. Performed at [facility], [place] [address] [place], [place] [address]   Culture, blood (routine x 2)     Status: None (Preliminary result)   Collection Time: 08/23/20  7:20 AM   Specimen: BLOOD RIGHT HAND  Result Value Ref Range Status   Specimen Description   Final    BLOOD RIGHT HAND Performed at [facility] [address] [place], [place] [address]    Special Requests   Final    BOTTLES DRAWN AEROBIC ONLY Blood Culture adequate volume Performed at [facility] [address], [place], [place] [address]    Culture   Final    NO GROWTH 3 DAYS Performed at [facility], [place] [address] [place], [place] [address]    Report Status PENDING  Incomplete  Culture, blood (routine x 2)     Status: None (Preliminary result)   Collection Time: 08/23/20  7:36 AM   Specimen: BLOOD RIGHT HAND  Result Value Ref Range Status   Specimen Description   Final    BLOOD RIGHT HAND Performed at [facility] [address] [place], [place] [address]    Special Requests   Final    BOTTLES DRAWN AEROBIC ONLY Blood Culture adequate volume Performed at [facility] [address] [place], [place] [address]    Culture   Final     NO GROWTH 3 DAYS Performed at [facility], [place] [address], [place], [place] [address]    Report Status PENDING  Incomplete     Labs: BNP (last 3 results) No results for input(s): BNP in the last 8760 hours. Basic Metabolic Panel: Recent Labs  Lab 08/21/20 0922 08/21/20 1305 08/23/20 0424 08/24/20 0433 08/25/20 0731 08/26/20 0519 08/27/20 0406  NA  --    < > 133* 135 134* 135 135  K  --    < > 3.8 4.1 4.2 4.2 5.6*  CL  --    < > 100 105 100 105 104  CO2  --    < > '26 24 25 24 26  ' GLUCOSE  --    < >  101* 96 102* 104* 436*  BUN  --    < > <5* 7 7 <5* <5*  CREATININE  --    < > <0.30* <0.30* <0.30* <0.30* <0.30*  CALCIUM  --    < > 8.3* 8.3* 8.5* 8.8* 7.9*  MG 1.8  --   --   --   --  2.0 1.8  PHOS  --   --   --   --   --  3.4 3.0   < > = values in this interval not displayed.   Liver Function Tests: Recent Labs  Lab 08/21/20 0814 08/22/20 0413 08/23/20 0424 08/24/20 0433 08/27/20 0406  AST 1,897* 621* 233* 111* 40  ALT 1,573* 802* 506* 278* 73*  ALKPHOS 292* 224* 190* 171* 135*  BILITOT 3.0* 1.3* 0.8 0.7 0.9  PROT 8.5* 6.0* 6.5 6.1* 5.6*  ALBUMIN 4.2 3.1* 3.3* 3.1* 3.0*   Recent Labs  Lab 08/20/20 1616 08/21/20 0922  LIPASE 27 21   No results for input(s): AMMONIA in the last 168 hours. CBC: Recent Labs  Lab 08/20/20 1616 08/20/20 1616 08/21/20 0814 08/22/20 0413 08/23/20 0424 08/24/20 0433 08/27/20 0406  WBC 7.2   < > 7.6 2.8* 3.2* 4.4 5.6  NEUTROABS 6.3  --  7.3  --  1.4* 1.6* 2.6  HGB 12.6*   < > 13.0 10.8* 10.8* 11.2* 10.1*  HCT 39.6   < > 41.0 33.6* 33.3* 35.5* 31.5*  MCV 82.8   < > 84.7 82.6 82.8 84.1 83.1  PLT 105*   < > 95* 72* 87* 98* 130*   < > = values in this interval not displayed.   Cardiac Enzymes: No results for input(s): CKTOTAL, CKMB, CKMBINDEX, TROPONINI in the last 168 hours. BNP: Invalid input(s): POCBNP CBG: Recent Labs  Lab 08/26/20 1540 08/26/20 2022 08/27/20 0014 08/27/20 0416 08/27/20 0753  GLUCAP 104*  97 93 91 114*   D-Dimer No results for input(s): DDIMER in the last 72 hours. Hgb A1c No results for input(s): HGBA1C in the last 72 hours. Lipid Profile Recent Labs    08/27/20 0406  TRIG 55   Thyroid function studies No results for input(s): TSH, T4TOTAL, T3FREE, THYROIDAB in the last 72 hours.  Invalid input(s): FREET3 Anemia work up No results for input(s): VITAMINB12, FOLATE, FERRITIN, TIBC, IRON, RETICCTPCT in the last 72 hours. Urinalysis    Component Value Date/Time   COLORURINE AMBER (A) 08/21/2020 0814   APPEARANCEUR HAZY (A) 08/21/2020 0814   LABSPEC 1.015 08/21/2020 0814   PHURINE 5.0 08/21/2020 0814   GLUCOSEU NEGATIVE 08/21/2020 0814   HGBUR NEGATIVE 08/21/2020 0814   BILIRUBINUR NEGATIVE 08/21/2020 0814   KETONESUR 20 (A) 08/21/2020 0814   PROTEINUR 100 (A) 08/21/2020 0814   NITRITE NEGATIVE 08/21/2020 0814   LEUKOCYTESUR NEGATIVE 08/21/2020 0814   Sepsis Labs Invalid input(s): PROCALCITONIN,  WBC,  LACTICIDVEN Microbiology Recent Results (from the past 240 hour(s))  Culture, blood (routine x 2)     Status: Abnormal   Collection Time: 08/20/20  4:10 PM   Specimen: BLOOD LEFT ARM  Result Value Ref Range Status   Specimen Description   Final    BLOOD LEFT ARM UPPER PICC Performed at [facility], [address] [place], [place] [address]    Special Requests   Final    BOTTLES DRAWN AEROBIC AND ANAEROBIC Blood Culture adequate volume Performed at [facility], [address] [place],  [address]    Culture  Setup Time   Final    GRAM POSITIVE COCCI IN BOTH AEROBIC AND ANAEROBIC BOTTLES Organism ID to follow CRITICAL RESULT CALLED TO, READ BACK BY AND VERIFIED WITH: PHRMD M LILLISON '@[id number]'  08/21/20 BY S GEZAHEGN Performed at [facility], [address] [place], [place] [address]    Culture STAPHYLOCOCCUS AUREUS (A)  Final   Report Status 08/23/2020 FINAL  Final   Organism ID, Bacteria STAPHYLOCOCCUS AUREUS   Final      Susceptibility   Staphylococcus aureus - MIC*    CIPROFLOXACIN <=0.5 SENSITIVE Sensitive     ERYTHROMYCIN <=0.25 SENSITIVE Sensitive     GENTAMICIN <=0.5 SENSITIVE Sensitive     OXACILLIN 0.5 SENSITIVE Sensitive     TETRACYCLINE <=1 SENSITIVE Sensitive     VANCOMYCIN 1 SENSITIVE Sensitive     TRIMETH/SULFA <=10 SENSITIVE Sensitive     CLINDAMYCIN <=0.25 SENSITIVE Sensitive     RIFAMPIN <=0.5 SENSITIVE Sensitive     Inducible Clindamycin NEGATIVE Sensitive     * STAPHYLOCOCCUS AUREUS  Blood Culture ID Panel (Reflexed)     Status: Abnormal   Collection Time: 08/20/20  4:10 PM  Result Value Ref Range Status   Enterococcus faecalis NOT DETECTED NOT DETECTED Final   Enterococcus Faecium NOT DETECTED NOT DETECTED Final   Listeria monocytogenes NOT DETECTED NOT DETECTED Final   Staphylococcus species DETECTED (A) NOT DETECTED Final    Comment: CRITICAL RESULT CALLED TO, READ BACK BY AND VERIFIED WITH: PHRMD M LILLISON '@[id number]'  08/21/20 BY S GEZAHEGN    Staphylococcus aureus (BCID) DETECTED (A) NOT DETECTED Final    Comment: CRITICAL RESULT CALLED TO, READ BACK BY AND VERIFIED WITH: PHRMD M LILLISON '@[id number]'  08/21/20 BY S GEZAHEGN    Staphylococcus epidermidis NOT DETECTED NOT DETECTED Final   Staphylococcus lugdunensis NOT DETECTED NOT DETECTED Final   Streptococcus species NOT DETECTED NOT DETECTED Final   Streptococcus agalactiae NOT DETECTED NOT DETECTED Final   Streptococcus pneumoniae NOT DETECTED NOT DETECTED Final   Streptococcus pyogenes NOT DETECTED NOT DETECTED Final   A.calcoaceticus-baumannii NOT DETECTED NOT DETECTED Final   Bacteroides fragilis NOT DETECTED NOT DETECTED Final   Enterobacterales NOT DETECTED NOT DETECTED Final   Enterobacter cloacae complex NOT DETECTED NOT DETECTED Final   Escherichia coli NOT DETECTED NOT DETECTED Final   Klebsiella aerogenes NOT DETECTED NOT DETECTED Final   Klebsiella oxytoca NOT DETECTED NOT DETECTED Final   Klebsiella  pneumoniae NOT DETECTED NOT DETECTED Final   Proteus species NOT DETECTED NOT DETECTED Final   Salmonella species NOT DETECTED NOT DETECTED Final   Serratia marcescens NOT DETECTED NOT DETECTED Final   Haemophilus influenzae NOT DETECTED NOT DETECTED Final   Neisseria meningitidis NOT DETECTED NOT DETECTED Final   Pseudomonas aeruginosa NOT DETECTED NOT DETECTED Final   Stenotrophomonas maltophilia NOT DETECTED NOT DETECTED Final   Candida albicans NOT DETECTED NOT DETECTED Final   Candida auris NOT DETECTED NOT DETECTED Final   Candida glabrata NOT DETECTED NOT DETECTED Final   Candida krusei NOT DETECTED NOT DETECTED Final   Candida parapsilosis NOT DETECTED NOT DETECTED Final   Candida tropicalis NOT DETECTED NOT DETECTED Final   Cryptococcus neoformans/gattii NOT DETECTED NOT DETECTED Final   Meth resistant mecA/C and MREJ NOT DETECTED NOT DETECTED Final    Comment: Performed at [facility], [address], [place], [place] [address]  Respiratory Panel by RT PCR (Flu A&B, Covid) - Nasopharyngeal Swab     Status: None   Collection Time: 08/20/20  4:16 PM   Specimen: Nasopharyngeal Swab  Result Value Ref Range Status   SARS Coronavirus 2 by RT PCR NEGATIVE NEGATIVE Final    Comment: (NOTE) SARS-CoV-2 target nucleic acids are NOT DETECTED.  The SARS-CoV-2 RNA is generally detectable in upper respiratoy specimens during the acute phase of infection. The lowest concentration of SARS-CoV-2 viral copies this assay can detect is 131 copies/mL. A negative result does not preclude SARS-Cov-2 infection and should not be used as the sole basis for treatment or other patient management decisions. A negative result may occur with  improper specimen collection/handling, submission of specimen other than nasopharyngeal swab, presence of viral mutation(s) within the areas targeted by this assay, and inadequate number of viral copies (<131 copies/mL). A negative result must be combined  with clinical observations, patient history, and epidemiological information. The expected result is Negative.  Fact Sheet for Patients:  PinkCheek.be  Fact Sheet for Healthcare Providers:  GravelBags.it  This test is no t yet approved or cleared by the [place] FDA and  has been authorized for detection and/or diagnosis of SARS-CoV-2 by FDA under an Emergency Use Authorization (EUA). This EUA will remain  in effect (meaning this test can be used) for the duration of the COVID-19 declaration under Section 564(b)(1) of the Act, 21 U.S.C. section 360bbb-3(b)(1), unless the authorization is terminated or revoked sooner.     Influenza A by PCR NEGATIVE NEGATIVE Final   Influenza B by PCR NEGATIVE NEGATIVE Final    Comment: (NOTE) The Xpert Xpress SARS-CoV-2/FLU/RSV assay is intended as an aid in  the diagnosis of influenza from Nasopharyngeal swab specimens and  should not be used as a sole basis for treatment. Nasal washings and  aspirates are unacceptable for Xpert Xpress SARS-CoV-2/FLU/RSV  testing.  Fact Sheet for Patients: PinkCheek.be  Fact Sheet for Healthcare Providers: GravelBags.it  This test is not yet approved or cleared by the [place] FDA and  has been authorized for detection and/or diagnosis of SARS-CoV-2 by  FDA under an Emergency Use Authorization (EUA). This EUA will remain  in effect (meaning this test can be used) for the duration of the  Covid-19 declaration under Section 564(b)(1) of the Act, 21  U.S.C. section 360bbb-3(b)(1), unless the authorization is  terminated or revoked. Performed at [facility], [address] [place], [place] [address]   Urine culture     Status: Abnormal   Collection Time: 08/20/20  7:36 PM   Specimen: Urine, Random  Result Value Ref Range Status   Specimen Description   Final    URINE,  RANDOM Performed at [facility], [address] [place], [place] [address]    Special Requests   Final    NONE Performed at [facility], [address] [place], [place] [address]    Culture (A)  Final    60,000 COLONIES/mL ESCHERICHIA COLI 30,000 COLONIES/mL ENTEROCOCCUS FAECALIS    Report Status 08/23/2020 FINAL  Final   Organism ID, Bacteria ESCHERICHIA COLI (A)  Final   Organism ID, Bacteria ENTEROCOCCUS FAECALIS (A)  Final      Susceptibility   Escherichia coli - MIC*    AMPICILLIN >=32 RESISTANT Resistant     CEFAZOLIN <=4 SENSITIVE Sensitive     CEFEPIME <=0.12 SENSITIVE Sensitive     CEFTRIAXONE <=0.25 SENSITIVE Sensitive     CIPROFLOXACIN >=4 RESISTANT Resistant     GENTAMICIN <=1 SENSITIVE Sensitive     IMIPENEM <=0.25 SENSITIVE  Sensitive     NITROFURANTOIN <=16 SENSITIVE Sensitive     TRIMETH/SULFA <=20 SENSITIVE Sensitive     AMPICILLIN/SULBACTAM >=32 RESISTANT Resistant     PIP/TAZO <=4 SENSITIVE Sensitive     * 60,000 COLONIES/mL ESCHERICHIA COLI   Enterococcus faecalis - MIC*    AMPICILLIN <=2 SENSITIVE Sensitive     NITROFURANTOIN <=16 SENSITIVE Sensitive     VANCOMYCIN 1 SENSITIVE Sensitive     * 30,000 COLONIES/mL ENTEROCOCCUS FAECALIS  Urine culture     Status: Abnormal   Collection Time: 08/21/20  8:14 AM   Specimen: In/Out Cath Urine  Result Value Ref Range Status   Specimen Description   Final    IN/OUT CATH URINE Performed at [facility] [address] [place], [place] [address]    Special Requests   Final    URINE, RANDOM Performed at [facility] [address], [place], [place] [address]    Culture (A)  Final    20,000 COLONIES/mL ESCHERICHIA COLI 20,000 COLONIES/mL ENTEROCOCCUS FAECALIS    Report Status 08/23/2020 FINAL  Final   Organism ID, Bacteria ESCHERICHIA COLI (A)  Final   Organism ID, Bacteria ENTEROCOCCUS FAECALIS (A)  Final      Susceptibility   Escherichia coli  - MIC*    AMPICILLIN >=32 RESISTANT Resistant     CEFAZOLIN 16 SENSITIVE Sensitive     CEFEPIME <=0.12 SENSITIVE Sensitive     CEFTRIAXONE <=0.25 SENSITIVE Sensitive     CIPROFLOXACIN >=4 RESISTANT Resistant     GENTAMICIN <=1 SENSITIVE Sensitive     IMIPENEM <=0.25 SENSITIVE Sensitive     NITROFURANTOIN <=16 SENSITIVE Sensitive     TRIMETH/SULFA <=20 SENSITIVE Sensitive     AMPICILLIN/SULBACTAM >=32 RESISTANT Resistant     PIP/TAZO <=4 SENSITIVE Sensitive     * 20,000 COLONIES/mL ESCHERICHIA COLI   Enterococcus faecalis - MIC*    AMPICILLIN <=2 SENSITIVE Sensitive     NITROFURANTOIN <=16 SENSITIVE Sensitive     VANCOMYCIN 1 SENSITIVE Sensitive     * 20,000 COLONIES/mL ENTEROCOCCUS FAECALIS  Blood culture (routine x 2)     Status: Abnormal   Collection Time: 08/21/20  8:14 AM   Specimen: BLOOD RIGHT ARM  Result Value Ref Range Status   Specimen Description   Final    BLOOD RIGHT ARM Performed at [facility], [address], [place], [place] [address]    Special Requests   Final    BOTTLES DRAWN AEROBIC ONLY Blood Culture results may not be optimal due to an inadequate volume of blood received in culture bottles Performed at [facility] [address], [place], [place] [address]    Culture  Setup Time   Final    GRAM POSITIVE COCCI IN CLUSTERS AEROBIC BOTTLE ONLY Organism ID to follow CRITICAL RESULT CALLED TO, READ BACK BY AND VERIFIED WITH: Barth Kirks [facility] 7412 08/22/20 A BROWNING Performed at [facility], [address], [place], [place] [address]    Culture STAPHYLOCOCCUS AUREUS (A)  Final   Report Status 08/24/2020 FINAL  Final   Organism ID, Bacteria STAPHYLOCOCCUS AUREUS  Final      Susceptibility   Staphylococcus aureus - MIC*    CIPROFLOXACIN <=0.5 SENSITIVE Sensitive     ERYTHROMYCIN <=0.25 SENSITIVE Sensitive     GENTAMICIN <=0.5 SENSITIVE Sensitive     OXACILLIN <=0.25 SENSITIVE Sensitive     TETRACYCLINE <=1 SENSITIVE  Sensitive     VANCOMYCIN 1 SENSITIVE Sensitive  TRIMETH/SULFA <=10 SENSITIVE Sensitive     CLINDAMYCIN <=0.25 SENSITIVE Sensitive     RIFAMPIN <=0.5 SENSITIVE Sensitive     Inducible Clindamycin NEGATIVE Sensitive     * STAPHYLOCOCCUS AUREUS  Blood Culture ID Panel (Reflexed)     Status: Abnormal   Collection Time: 08/21/20  8:14 AM  Result Value Ref Range Status   Enterococcus faecalis NOT DETECTED NOT DETECTED Final   Enterococcus Faecium NOT DETECTED NOT DETECTED Final   Listeria monocytogenes NOT DETECTED NOT DETECTED Final   Staphylococcus species DETECTED (A) NOT DETECTED Final    Comment: CRITICAL RESULT CALLED TO, READ BACK BY AND VERIFIED WITH: Barth Kirks PHARMD 1911 08/22/20 A BROWNING    Staphylococcus aureus (BCID) DETECTED (A) NOT DETECTED Final    Comment: CRITICAL RESULT CALLED TO, READ BACK BY AND VERIFIED WITH: Barth Kirks PHARMD 1911 08/22/20 A BROWNING    Staphylococcus epidermidis NOT DETECTED NOT DETECTED Final   Staphylococcus lugdunensis NOT DETECTED NOT DETECTED Final   Streptococcus species NOT DETECTED NOT DETECTED Final   Streptococcus agalactiae NOT DETECTED NOT DETECTED Final   Streptococcus pneumoniae NOT DETECTED NOT DETECTED Final   Streptococcus pyogenes NOT DETECTED NOT DETECTED Final   A.calcoaceticus-baumannii NOT DETECTED NOT DETECTED Final   Bacteroides fragilis NOT DETECTED NOT DETECTED Final   Enterobacterales NOT DETECTED NOT DETECTED Final   Enterobacter cloacae complex NOT DETECTED NOT DETECTED Final   Escherichia coli NOT DETECTED NOT DETECTED Final   Klebsiella aerogenes NOT DETECTED NOT DETECTED Final   Klebsiella oxytoca NOT DETECTED NOT DETECTED Final   Klebsiella pneumoniae NOT DETECTED NOT DETECTED Final   Proteus species NOT DETECTED NOT DETECTED Final   Salmonella species NOT DETECTED NOT DETECTED Final   Serratia marcescens NOT DETECTED NOT DETECTED Final   Haemophilus influenzae NOT DETECTED NOT DETECTED Final   Neisseria  meningitidis NOT DETECTED NOT DETECTED Final   Pseudomonas aeruginosa NOT DETECTED NOT DETECTED Final   Stenotrophomonas maltophilia NOT DETECTED NOT DETECTED Final   Candida albicans NOT DETECTED NOT DETECTED Final   Candida auris NOT DETECTED NOT DETECTED Final   Candida glabrata NOT DETECTED NOT DETECTED Final   Candida krusei NOT DETECTED NOT DETECTED Final   Candida parapsilosis NOT DETECTED NOT DETECTED Final   Candida tropicalis NOT DETECTED NOT DETECTED Final   Cryptococcus neoformans/gattii NOT DETECTED NOT DETECTED Final   Meth resistant mecA/C and MREJ NOT DETECTED NOT DETECTED Final    Comment: Performed at [facility], [address] [place], [place] [address]  Respiratory Panel by RT PCR (Flu A&B, Covid) - Nasopharyngeal Swab     Status: None   Collection Time: 08/21/20  8:27 AM   Specimen: Nasopharyngeal Swab  Result Value Ref Range Status   SARS Coronavirus 2 by RT PCR NEGATIVE NEGATIVE Final    Comment: (NOTE) SARS-CoV-2 target nucleic acids are NOT DETECTED.  The SARS-CoV-2 RNA is generally detectable in upper respiratoy specimens during the acute phase of infection. The lowest concentration of SARS-CoV-2 viral copies this assay can detect is 131 copies/mL. A negative result does not preclude SARS-Cov-2 infection and should not be used as the sole basis for treatment or other patient management decisions. A negative result may occur with  improper specimen collection/handling, submission of specimen other than nasopharyngeal swab, presence of viral mutation(s) within the areas targeted by this assay, and inadequate number of viral copies (<131 copies/mL). A negative result must be combined with clinical observations, patient history, and epidemiological information. The expected result  is Negative.  Fact Sheet for Patients:  PinkCheek.be  Fact Sheet for Healthcare Providers:  GravelBags.it  This  test is no t yet approved or cleared by the [place] FDA and  has been authorized for detection and/or diagnosis of SARS-CoV-2 by FDA under an Emergency Use Authorization (EUA). This EUA will remain  in effect (meaning this test can be used) for the duration of the COVID-19 declaration under Section 564(b)(1) of the Act, 21 U.S.C. section 360bbb-3(b)(1), unless the authorization is terminated or revoked sooner.     Influenza A by PCR NEGATIVE NEGATIVE Final   Influenza B by PCR NEGATIVE NEGATIVE Final    Comment: (NOTE) The Xpert Xpress SARS-CoV-2/FLU/RSV assay is intended as an aid in  the diagnosis of influenza from Nasopharyngeal swab specimens and  should not be used as a sole basis for treatment. Nasal washings and  aspirates are unacceptable for Xpert Xpress SARS-CoV-2/FLU/RSV  testing.  Fact Sheet for Patients: PinkCheek.be  Fact Sheet for Healthcare Providers: GravelBags.it  This test is not yet approved or cleared by the [place] FDA and  has been authorized for detection and/or diagnosis of SARS-CoV-2 by  FDA under an Emergency Use Authorization (EUA). This EUA will remain  in effect (meaning this test can be used) for the duration of the  Covid-19 declaration under Section 564(b)(1) of the Act, 21  U.S.C. section 360bbb-3(b)(1), unless the authorization is  terminated or revoked. Performed at [facility], [place] [address], [place], [place] [address]   Culture, blood (routine x 2)     Status: None (Preliminary result)   Collection Time: 08/23/20  7:20 AM   Specimen: BLOOD RIGHT HAND  Result Value Ref Range Status   Specimen Description   Final    BLOOD RIGHT HAND Performed at [facility] [address] [place], [place] [address]    Special Requests   Final    BOTTLES DRAWN AEROBIC ONLY Blood Culture adequate volume Performed at [facility] [address] [place], [place] [address]    Culture   Final    NO GROWTH 3 DAYS Performed at [facility], [place] [address], [place], [place] [address]    Report Status PENDING  Incomplete  Culture, blood (routine x 2)     Status: None (Preliminary result)   Collection Time: 08/23/20  7:36 AM   Specimen: BLOOD RIGHT HAND  Result Value Ref Range Status   Specimen Description   Final    BLOOD RIGHT HAND Performed at [facility] [address] [place], [place] [address]    Special Requests   Final    BOTTLES DRAWN AEROBIC ONLY Blood Culture adequate volume Performed at [facility] [address] [place], [place] [address]    Culture   Final    NO GROWTH 3 DAYS Performed at [facility], [place] [address] [place], [place] [address]    Report Status PENDING  Incomplete     Time coordinating discharge: Over 30 minutes  SIGNED:   Charlynne Cousins, MD  [facility] 08/27/2020, 9:11 AM Pager   If 7PM-7AM, please contact night-coverage [email or web address] Password [email or web address]

## 2020-08-27 NOTE — Progress Notes (Signed)
Pt on continues Bi- Pap Daniel RT aware, the pt's is stable. I will continue to monitor.

## 2020-08-27 NOTE — TOC Progression Note (Signed)
Transition of Care [facility]) - Progression Note    Patient Details  Name: Peter Hayes MRN: [id number] Date of Birth: Jan 01, 1996  Transition of Care [facility]) CM/SW Contact  Joaquin Courts, RN Phone Number: 08/27/2020, 11:47 AM  Clinical Narrative:    CM confirms with [employer] rep Pam chandler everything is in place for DC today, HHRN orders placed.  Patient will dc home with [facility] services and IV antibiotics and home TPN.  Patient has private duty caregivers through [place].   Expected Discharge Plan: [place] Barriers to Discharge: No Barriers Identified  Expected Discharge Plan and Services Expected Discharge Plan: [place]   Discharge Planning Services: CM Consult   Living arrangements for the past 2 months: Single Family Home Expected Discharge Date: 08/27/20                         HH Arranged: IV Antibiotics HH Agency: [employer] Date HH Agency Contacted: 08/22/20 Time [place]: 1301 Representative spoke with at [place]: [place] ([place]) Interventions    Readmission Risk Interventions No flowsheet data found.

## 2020-08-28 LAB — CULTURE, BLOOD (ROUTINE X 2)
Culture: NO GROWTH
Culture: NO GROWTH
Special Requests: ADEQUATE
Special Requests: ADEQUATE

## 2020-09-15 ENCOUNTER — Other Ambulatory Visit: Payer: Self-pay | Admitting: Internal Medicine

## 2020-09-15 DIAGNOSIS — J9601 Acute respiratory failure with hypoxia: Secondary | ICD-10-CM

## 2020-09-15 DIAGNOSIS — J189 Pneumonia, unspecified organism: Secondary | ICD-10-CM

## 2020-09-19 ENCOUNTER — Inpatient Hospital Stay: Payer: Medicaid Other | Admitting: Internal Medicine

## 2020-10-03 ENCOUNTER — Emergency Department (HOSPITAL_BASED_OUTPATIENT_CLINIC_OR_DEPARTMENT_OTHER)
Admission: EM | Admit: 2020-10-03 | Discharge: 2020-10-03 | Disposition: A | Discharge status: Home / Self Care | Payer: Managed Care, Other (non HMO) | Attending: Emergency Medicine | Admitting: Emergency Medicine

## 2020-10-03 ENCOUNTER — Encounter (HOSPITAL_BASED_OUTPATIENT_CLINIC_OR_DEPARTMENT_OTHER): Payer: Self-pay

## 2020-10-03 ENCOUNTER — Other Ambulatory Visit: Payer: Self-pay

## 2020-10-03 DIAGNOSIS — Z5321 Procedure and treatment not carried out due to patient leaving prior to being seen by health care provider: Secondary | ICD-10-CM | POA: Diagnosis not present

## 2020-10-03 DIAGNOSIS — R109 Unspecified abdominal pain: Secondary | ICD-10-CM | POA: Diagnosis present

## 2020-10-03 NOTE — ED Triage Notes (Signed)
Pt reports abdominal pain since lunch time today. Pt denies n/v.

## 2020-10-24 ENCOUNTER — Encounter (HOSPITAL_BASED_OUTPATIENT_CLINIC_OR_DEPARTMENT_OTHER): Payer: Self-pay | Admitting: *Deleted

## 2020-10-24 ENCOUNTER — Emergency Department (HOSPITAL_BASED_OUTPATIENT_CLINIC_OR_DEPARTMENT_OTHER)
Admission: EM | Admit: 2020-10-24 | Discharge: 2020-10-25 | Disposition: A | Discharge status: Home / Self Care | Payer: Managed Care, Other (non HMO) | Source: Home / Self Care | Attending: Emergency Medicine | Admitting: Emergency Medicine

## 2020-10-24 ENCOUNTER — Emergency Department (HOSPITAL_BASED_OUTPATIENT_CLINIC_OR_DEPARTMENT_OTHER): Payer: Managed Care, Other (non HMO)

## 2020-10-24 ENCOUNTER — Other Ambulatory Visit: Payer: Self-pay

## 2020-10-24 DIAGNOSIS — Z20822 Contact with and (suspected) exposure to covid-19: Secondary | ICD-10-CM

## 2020-10-24 DIAGNOSIS — R5081 Fever presenting with conditions classified elsewhere: Secondary | ICD-10-CM

## 2020-10-24 DIAGNOSIS — T80211A Bloodstream infection due to central venous catheter, initial encounter: Secondary | ICD-10-CM | POA: Diagnosis not present

## 2020-10-24 DIAGNOSIS — M62469 Contracture of muscle, unspecified lower leg: Secondary | ICD-10-CM | POA: Insufficient documentation

## 2020-10-24 DIAGNOSIS — R509 Fever, unspecified: Secondary | ICD-10-CM | POA: Insufficient documentation

## 2020-10-24 DIAGNOSIS — R7881 Bacteremia: Secondary | ICD-10-CM | POA: Diagnosis not present

## 2020-10-24 DIAGNOSIS — Z933 Colostomy status: Secondary | ICD-10-CM | POA: Insufficient documentation

## 2020-10-24 LAB — RESP PANEL BY RT-PCR (FLU A&B, COVID) ARPGX2
Influenza A by PCR: NEGATIVE
Influenza B by PCR: NEGATIVE
SARS Coronavirus 2 by RT PCR: NEGATIVE

## 2020-10-24 LAB — URINALYSIS, ROUTINE W REFLEX MICROSCOPIC
Bilirubin Urine: NEGATIVE
Glucose, UA: NEGATIVE mg/dL
Hgb urine dipstick: NEGATIVE
Ketones, ur: NEGATIVE mg/dL
Leukocytes,Ua: NEGATIVE
Nitrite: NEGATIVE
Protein, ur: NEGATIVE mg/dL
Specific Gravity, Urine: 1.025 (ref 1.005–1.030)
pH: 5 (ref 5.0–8.0)

## 2020-10-24 LAB — CBC WITH DIFFERENTIAL/PLATELET
Abs Immature Granulocytes: 0.02 10*3/uL (ref 0.00–0.07)
Basophils Absolute: 0 10*3/uL (ref 0.0–0.1)
Basophils Relative: 0 %
Eosinophils Absolute: 0 10*3/uL (ref 0.0–0.5)
Eosinophils Relative: 0 %
HCT: 36.1 % — ABNORMAL LOW (ref 39.0–52.0)
Hemoglobin: 11.5 g/dL — ABNORMAL LOW (ref 13.0–17.0)
Immature Granulocytes: 0 %
Lymphocytes Relative: 7 %
Lymphs Abs: 0.5 10*3/uL — ABNORMAL LOW (ref 0.7–4.0)
MCH: 27 pg (ref 26.0–34.0)
MCHC: 31.9 g/dL (ref 30.0–36.0)
MCV: 84.7 fL (ref 80.0–100.0)
Monocytes Absolute: 0.5 10*3/uL (ref 0.1–1.0)
Monocytes Relative: 7 %
Neutro Abs: 6 10*3/uL (ref 1.7–7.7)
Neutrophils Relative %: 86 %
Platelets: 102 10*3/uL — ABNORMAL LOW (ref 150–400)
RBC: 4.26 MIL/uL (ref 4.22–5.81)
RDW: 15.2 % (ref 11.5–15.5)
WBC: 7.1 10*3/uL (ref 4.0–10.5)
nRBC: 0 % (ref 0.0–0.2)

## 2020-10-24 LAB — CBG MONITORING, ED: Glucose-Capillary: 81 mg/dL (ref 70–99)

## 2020-10-24 LAB — LACTIC ACID, PLASMA: Lactic Acid, Venous: 0.7 mmol/L (ref 0.5–1.9)

## 2020-10-24 MED ORDER — ACETAMINOPHEN 160 MG/5ML PO SOLN
650.0000 mg | Freq: Once | ORAL | Status: DC
  Filled 2020-10-24: qty 20.3

## 2020-10-24 MED ORDER — LACTATED RINGERS IV BOLUS
1000.0000 mL | Freq: Once | INTRAVENOUS | Status: AC
  Administered 2020-10-24: 1000 mL via INTRAVENOUS

## 2020-10-24 MED ORDER — ACETAMINOPHEN 325 MG PO TABS
650.0000 mg | ORAL_TABLET | Freq: Once | ORAL | Status: AC
  Administered 2020-10-24: 650 mg via ORAL
  Filled 2020-10-24: qty 2

## 2020-10-24 NOTE — ED Provider Notes (Signed)
[facility] EMERGENCY DEPARTMENT Provider Note  CSN: [id number] Arrival date & time: 10/24/20 1848    History Chief Complaint  Patient presents with  . Fever    HPI  Courage Biglow is a 25 y.o. 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 Patient's sister recently diagnosed with Covid. Patient had been doing well recently but was noted to be running a fever this afternoon prompting him to come to the ED for evaluation. Patient denies any cough, SOB or chest pain. No abdominal pain, vomiting or diarrhea (has an ostomy). No dysuria or urinary frequency (does not do caths at home). He is otherwise in his normal state of health.    Past Medical History:  Diagnosis Date  . Acute respiratory failure with hypoxia and hypercapnia (HCC) 05/16/2018  . HCAP (healthcare-associated pneumonia) 12/01/2017  . High anion gap metabolic acidosis 05/05/2016  . Hypotension   . Ileus (HCC) 09/23/2017  . Leukocytosis 05/05/2016  . MD (muscular dystrophy) (HCC)   . Refusal of blood product    patient is Luan Moore witness  . Respiratory alkalosis 05/05/2016  . Sepsis (HCC) 05/05/2016  . Severe sepsis (HCC) 09/16/2019  . Sinus tachycardia 05/05/2016    Past Surgical History:  Procedure Laterality Date  . COLOSTOMY    . EYE SURGERY    . FLEXIBLE SIGMOIDOSCOPY N/A 01/10/2018   Procedure: FLEXIBLE SIGMOIDOSCOPY;  Surgeon: Carman Ching, MD;  Location: [facility];  Service: Endoscopy;  Laterality: N/A;  . SMALL INTESTINE SURGERY      No family history on file.  Social History   Tobacco Use  . Smoking status: Never Smoker  . Smokeless tobacco: Never Used  Vaping Use  . Vaping Use: Never used  Substance Use Topics  . Alcohol use: No  . Drug use: No     Home Medications Prior to Admission medications   Medication Sig Start Date End Date Taking? Authorizing Provider  Catheters (ARGYLE SUCTION CATHETER  14FR) MISC 1 packet by Does not apply route daily. 08/27/20   Marinda Elk, MD  ceFAZolin (ANCEF) 2-4 GM/100ML-% IVPB Inject 100 mLs (2 g total) into the vein every 8 (eight) hours. 08/27/20   Marinda Elk, MD  feeding supplement, ENSURE ENLIVE, (ENSURE ENLIVE) LIQD You can use whatever supplement he likes.  He needs about 1300 calories per day. 1.5 liters of fluid and 50 grams of protein per day. You can buy this at the grocery or drug store.  You do not need a prescription. 06/21/20   Rhetta Mura, MD  guaiFENesin (ROBITUSSIN) 100 MG/5ML SOLN Take 15 mLs (300 mg total) by mouth every 6 (six) hours. 06/21/20   Rhetta Mura, MD  ipratropium-albuterol (DUONEB) 0.5-2.5 (3) MG/3ML SOLN Take 3 mLs by nebulization every 6 (six) hours as needed. Patient taking differently: Take 3 mLs by nebulization every 6 (six) hours as needed (breathing / congestion).  09/21/19   Jae Dire, MD  magnesium oxide (MAG-OX) 400 MG tablet Take 400 mg by mouth 3 (three) times daily.    [provider]  metoprolol tartrate (LOPRESSOR) 25 MG tablet Take 25 mg by mouth every 8 (eight) hours. 08/09/20   [provider]  ondansetron (ZOFRAN) 4 MG tablet Take 1 tablet (4 mg total) by mouth every 6 (six) hours as needed for nausea. Patient taking differently: Take 4 mg by mouth as needed for nausea.  06/21/20  Nita Sells, MD  polyethylene glycol [facility] / Floria Raveling) packet Follow package instruction for daily use.  You need to have one soft bowel movement per day.  If he is not doing this call your primary care doctor.  You also need to be sure he takes in 1.5 liters of fluid per day.   He needs protein supplement daily, and needs to take in about 1300 calories per day. Patient taking differently: Take 17 g by mouth daily. Follow package instruction for daily use.  You need to have one soft bowel movement per day.  If he is not doing this call your primary care doctor.  You  also need to be sure he takes in 1.5 liters of fluid per day.   He needs protein supplement daily, and needs to take in about 1300 calories per day. 01/15/18   Earnstine Regal, PA-C  scopolamine (TRANSDERM-SCOP) 1 MG/3DAYS Place 1 patch (1.5 mg total) onto the skin every 3 (three) days. 06/23/20   Nita Sells, MD  sterile water SOLN with amino acids 10 % SOLN 1.3 g/kg, dextrose 70 % SOLN 20 % Inject 1,000 mLs into the vein continuous. TPN 1000 ml w/ lipids  Directions: Infuse TPN 104ml IV through PICC line via CADD SOLIS infusion pump daily over 18 hours with 1 hr ramp up and 1 hr ramp down. Add MVI 10 ml as directed to each bag prior to infusion. MIX WELL VTBI: 1000 ML Bag Volume  1050 ml     Dosing Weight for the patient 72 lbs    [provider]     Allergies    Fentanyl   Review of Systems   Review of Systems A comprehensive review of systems was completed and negative except as noted in HPI.    Physical Exam BP 103/67 (BP Location: Right Arm)   Pulse (!) 108   Temp (!) 102 F (38.9 C) (Tympanic)   Resp (!) 29   Wt 34.9 kg   SpO2 99%   BMI 12.81 kg/m   Physical Exam Vitals and nursing note reviewed.  Constitutional:      Appearance: Normal appearance.  HENT:     Head: Normocephalic and atraumatic.     Nose: Nose normal.     Mouth/Throat:     Mouth: Mucous membranes are dry.     Comments: Trilogy vent device on face Eyes:     Extraocular Movements: Extraocular movements intact.     Conjunctiva/sclera: Conjunctivae normal.  Cardiovascular:     Rate and Rhythm: Normal rate.  Pulmonary:     Effort: Pulmonary effort is normal.     Breath sounds: Normal breath sounds. No wheezing or rhonchi.  Abdominal:     General: Abdomen is flat.     Palpations: Abdomen is soft.     Tenderness: There is no abdominal tenderness.     Comments: Ostomy in LLQ  Musculoskeletal:        General: No swelling.     Cervical back: Neck supple.     Comments: Chronic  contractures of extremities  Skin:    General: Skin is warm and dry.  Neurological:     Mental Status: He is alert and oriented to person, place, and time. Mental status is at baseline.  Psychiatric:        Mood and Affect: Mood normal.      ED Results / Procedures / Treatments   Labs (all labs ordered are listed, but only abnormal results are displayed) Labs  Reviewed  RESP PANEL BY RT-PCR (FLU A&B, COVID) [id number]  CULTURE, BLOOD (ROUTINE X 2)  CULTURE, BLOOD (ROUTINE X 2)  URINALYSIS, ROUTINE W REFLEX MICROSCOPIC  CBC WITH DIFFERENTIAL/PLATELET  COMPREHENSIVE METABOLIC PANEL  LACTIC ACID, PLASMA  LACTIC ACID, PLASMA    EKG None   Radiology DG Chest Port 1 View  Result Date: 10/24/2020 CLINICAL DATA:  None COVID exposure 1 day ago with fevers, initial encounter EXAM: PORTABLE CHEST 1 VIEW COMPARISON:  08/26/2020 FINDINGS: Cardiac shadow is within normal limits. The lungs are well aerated bilaterally. Mild scarring is noted stable from the prior exam. No acute infiltrate or sizable effusion is seen. No bony abnormality is noted. IMPRESSION: No acute abnormality noted. Electronically Signed   By: Inez Catalina M.D.   On: 10/24/2020 21:16    Procedures Procedures  Medications Ordered in the ED Medications  lactated ringers bolus 1,000 mL (1,000 mLs Intravenous New Bag/Given 10/24/20 2245)  acetaminophen (TYLENOL) 160 MG/5ML solution 650 mg (650 mg Oral Given 10/24/20 2256)     MDM Rules/Calculators/A&P MDM Patient noted to be febrile on arrival with history of Covid exposure. Otherwise he does not have any new symptoms. Will check rapid Covid swab and CXR. If Covid is positive, he will need to be considered for MAB. He is not vaccinated. If Covid is neg, will need to expand workup to assess other sources of infection. Father is amenable to this plan.    ED Course  I have reviewed the triage vital signs and the nursing notes.  Pertinent labs & imaging results that were  available during my care of the patient were reviewed by me and considered in my medical decision making (see chart for details).  Clinical Course as of 10/24/20 2314  Mon Oct 24, 2020  2121 CXR is clear.  [CS]  2131 Covid/Flu is negative. Will check CBC, CMP and UA as well as send cultures and lactic acid to eval source of fever.  [CS]  2312 UA is negative. Care of the patient will be signed out to Dr. Leonette Monarch at the change of shift.  [CS]    Clinical Course User Index [CS] Truddie Hidden, MD    Final Clinical Impression(s) / ED Diagnoses Final diagnoses:  None    Rx / DC Orders ED Discharge Orders    None       Truddie Hidden, MD 10/24/20 2314

## 2020-10-24 NOTE — ED Triage Notes (Signed)
Father states covid exposure with sx x 1 day , PTA tylenol

## 2020-10-24 NOTE — ED Notes (Signed)
Date and time results received: 10/24/20 2315 (use smartphrase ".now" to insert current time)  Test: Glucose  Critical Value: 1011  Name of Provider Notified: Cardama   Orders Received? Or Actions Taken?: Orders received

## 2020-10-24 NOTE — ED Notes (Signed)
Double lumen picc line in place to left upper arm, infusing TPN from home at 58.8cc/hr.  Colostomy mid lower abd, bag empty, site normal

## 2020-10-24 NOTE — ED Notes (Signed)
Date and time results received: 10/24/20 2348 (use smartphrase ".now" to insert current time)  Test: Potassium Critical Value: >7.5   Name of Provider Notified: Cardama   Orders Received? Or Actions Taken?: Orders Received - See Orders for [id number]

## 2020-10-25 LAB — COMPREHENSIVE METABOLIC PANEL
ALT: 81 U/L — ABNORMAL HIGH (ref 0–44)
ALT: 96 U/L — ABNORMAL HIGH (ref 0–44)
AST: 65 U/L — ABNORMAL HIGH (ref 15–41)
AST: 76 U/L — ABNORMAL HIGH (ref 15–41)
Albumin: 3.5 g/dL (ref 3.5–5.0)
Albumin: 4 g/dL (ref 3.5–5.0)
Alkaline Phosphatase: 117 U/L (ref 38–126)
Alkaline Phosphatase: 138 U/L — ABNORMAL HIGH (ref 38–126)
Anion gap: 11 (ref 5–15)
Anion gap: 7 (ref 5–15)
BUN: 14 mg/dL (ref 6–20)
BUN: 15 mg/dL (ref 6–20)
CO2: 22 mmol/L (ref 22–32)
CO2: 23 mmol/L (ref 22–32)
Calcium: 7.6 mg/dL — ABNORMAL LOW (ref 8.9–10.3)
Calcium: 8.7 mg/dL — ABNORMAL LOW (ref 8.9–10.3)
Chloride: 96 mmol/L — ABNORMAL LOW (ref 98–111)
Chloride: 99 mmol/L (ref 98–111)
Creatinine, Ser: <0.3 mg/dL — ABNORMAL LOW (ref 0.61–1.24)
Creatinine, Ser: <0.3 mg/dL — ABNORMAL LOW (ref 0.61–1.24)
Glucose, Bld: 100 mg/dL — ABNORMAL HIGH (ref 70–99)
Glucose, Bld: 1011 mg/dL (ref 70–99)
Potassium: 4.4 mmol/L (ref 3.5–5.1)
Potassium: >7.5 mmol/L (ref 3.5–5.1)
Sodium: 125 mmol/L — ABNORMAL LOW (ref 135–145)
Sodium: 133 mmol/L — ABNORMAL LOW (ref 135–145)
Total Bilirubin: 0.4 mg/dL (ref 0.3–1.2)
Total Bilirubin: 0.7 mg/dL (ref 0.3–1.2)
Total Protein: 6.7 g/dL (ref 6.5–8.1)
Total Protein: 7.6 g/dL (ref 6.5–8.1)

## 2020-10-25 NOTE — ED Provider Notes (Signed)
I assumed care of this patient.  Please see previous provider note for further details of Hx, PE.  Briefly patient is a 25 y.o. male who presented with several hours of fever. Known COVID exposure at home, but COVID/Flu was negative. Work up was expanded given his PMH.  Labs reassuring without leukocytosis.  Lactic acid negative.  UA negative.  Culture sent and pending.  Initial metabolic panel had severe derangements.  It appears that the specimen was pulled from the PICC line that TPN was being infused through.  Straight stick CMP grossly reassuring and close to the patient's baseline.  Given the history, it is likely that the patient has a viral infection, likely COVID.  Test was negative likely due to timing.  Recommended family retest in 1 to 2 days.  The patient appears reasonably screened and/or stabilized for discharge and I doubt any other medical condition or other [facility] requiring further screening, evaluation, or treatment in the ED at this time prior to discharge. Safe for discharge with strict return precautions.  Disposition: Discharge  Condition: Good  I have discussed the results, Dx and Tx plan with the patient/family who expressed understanding and agree(s) with the plan. Discharge instructions discussed at length. The patient/family was given strict return precautions who verbalized understanding of the instructions. No further questions at time of discharge.    ED Discharge Orders    None       Follow Up: Penni Bombard, [address] [address] [phone]  Call  As needed          Fatima Blank, MD 10/25/20 [phone]

## 2020-10-26 ENCOUNTER — Telehealth (HOSPITAL_BASED_OUTPATIENT_CLINIC_OR_DEPARTMENT_OTHER): Payer: Self-pay | Admitting: Emergency Medicine

## 2020-10-26 ENCOUNTER — Encounter (HOSPITAL_COMMUNITY): Payer: Self-pay | Admitting: Emergency Medicine

## 2020-10-26 ENCOUNTER — Other Ambulatory Visit: Payer: Self-pay

## 2020-10-26 ENCOUNTER — Inpatient Hospital Stay (HOSPITAL_COMMUNITY)
Admission: EM | Admit: 2020-10-26 | Discharge: 2020-11-01 | DRG: 314 | Disposition: A | Discharge status: Home Health | Payer: Managed Care, Other (non HMO) | Attending: Internal Medicine | Admitting: Internal Medicine

## 2020-10-26 ENCOUNTER — Emergency Department (HOSPITAL_COMMUNITY): Payer: Managed Care, Other (non HMO)

## 2020-10-26 DIAGNOSIS — K567 Ileus, unspecified: Secondary | ICD-10-CM | POA: Diagnosis present

## 2020-10-26 DIAGNOSIS — R7881 Bacteremia: Secondary | ICD-10-CM | POA: Diagnosis not present

## 2020-10-26 DIAGNOSIS — Y848 Other medical procedures as the cause of abnormal reaction of the patient, or of later complication, without mention of misadventure at the time of the procedure: Secondary | ICD-10-CM | POA: Diagnosis present

## 2020-10-26 DIAGNOSIS — Z933 Colostomy status: Secondary | ICD-10-CM

## 2020-10-26 DIAGNOSIS — G909 Disorder of the autonomic nervous system, unspecified: Secondary | ICD-10-CM | POA: Diagnosis present

## 2020-10-26 DIAGNOSIS — T80211A Bloodstream infection due to central venous catheter, initial encounter: Principal | ICD-10-CM | POA: Diagnosis present

## 2020-10-26 DIAGNOSIS — B957 Other staphylococcus as the cause of diseases classified elsewhere: Secondary | ICD-10-CM | POA: Diagnosis not present

## 2020-10-26 DIAGNOSIS — Z452 Encounter for adjustment and management of vascular access device: Secondary | ICD-10-CM

## 2020-10-26 DIAGNOSIS — A419 Sepsis, unspecified organism: Secondary | ICD-10-CM | POA: Diagnosis present

## 2020-10-26 DIAGNOSIS — Z20822 Contact with and (suspected) exposure to covid-19: Secondary | ICD-10-CM | POA: Diagnosis present

## 2020-10-26 DIAGNOSIS — Z9981 Dependence on supplemental oxygen: Secondary | ICD-10-CM

## 2020-10-26 DIAGNOSIS — J9611 Chronic respiratory failure with hypoxia: Secondary | ICD-10-CM | POA: Diagnosis present

## 2020-10-26 DIAGNOSIS — G7100 Muscular dystrophy, unspecified: Secondary | ICD-10-CM | POA: Diagnosis not present

## 2020-10-26 DIAGNOSIS — Z885 Allergy status to narcotic agent status: Secondary | ICD-10-CM

## 2020-10-26 DIAGNOSIS — Z79899 Other long term (current) drug therapy: Secondary | ICD-10-CM

## 2020-10-26 DIAGNOSIS — E43 Unspecified severe protein-calorie malnutrition: Secondary | ICD-10-CM | POA: Diagnosis present

## 2020-10-26 DIAGNOSIS — Z789 Other specified health status: Secondary | ICD-10-CM | POA: Diagnosis present

## 2020-10-26 DIAGNOSIS — D696 Thrombocytopenia, unspecified: Secondary | ICD-10-CM | POA: Diagnosis present

## 2020-10-26 DIAGNOSIS — Z23 Encounter for immunization: Secondary | ICD-10-CM

## 2020-10-26 DIAGNOSIS — Z681 Body mass index (BMI) 19 or less, adult: Secondary | ICD-10-CM

## 2020-10-26 DIAGNOSIS — B9561 Methicillin susceptible Staphylococcus aureus infection as the cause of diseases classified elsewhere: Secondary | ICD-10-CM | POA: Diagnosis present

## 2020-10-26 LAB — BLOOD CULTURE ID PANEL (REFLEXED) - BCID2

## 2020-10-26 LAB — CBC WITH DIFFERENTIAL/PLATELET
Abs Immature Granulocytes: 0.01 10*3/uL (ref 0.00–0.07)
Basophils Absolute: 0 10*3/uL (ref 0.0–0.1)
Basophils Relative: 0 %
Eosinophils Absolute: 0 10*3/uL (ref 0.0–0.5)
Eosinophils Relative: 0 %
HCT: 35.9 % — ABNORMAL LOW (ref 39.0–52.0)
Hemoglobin: 11.4 g/dL — ABNORMAL LOW (ref 13.0–17.0)
Immature Granulocytes: 0 %
Lymphocytes Relative: 15 %
Lymphs Abs: 0.7 10*3/uL (ref 0.7–4.0)
MCH: 26.4 pg (ref 26.0–34.0)
MCHC: 31.8 g/dL (ref 30.0–36.0)
MCV: 83.1 fL (ref 80.0–100.0)
Monocytes Absolute: 0.7 10*3/uL (ref 0.1–1.0)
Monocytes Relative: 13 %
Neutro Abs: 3.6 10*3/uL (ref 1.7–7.7)
Neutrophils Relative %: 72 %
Platelets: 93 10*3/uL — ABNORMAL LOW (ref 150–400)
RBC: 4.32 MIL/uL (ref 4.22–5.81)
RDW: 14.9 % (ref 11.5–15.5)
WBC: 5 10*3/uL (ref 4.0–10.5)
nRBC: 0 % (ref 0.0–0.2)

## 2020-10-26 LAB — LACTIC ACID, PLASMA
Lactic Acid, Venous: 0.6 mmol/L (ref 0.5–1.9)
Lactic Acid, Venous: 0.5 mmol/L (ref 0.5–1.9)

## 2020-10-26 LAB — URINALYSIS, ROUTINE W REFLEX MICROSCOPIC
Bilirubin Urine: NEGATIVE
Glucose, UA: NEGATIVE mg/dL
Hgb urine dipstick: NEGATIVE
Ketones, ur: 20 mg/dL — AB
Leukocytes,Ua: NEGATIVE
Nitrite: NEGATIVE
Protein, ur: NEGATIVE mg/dL
Specific Gravity, Urine: 1.023 (ref 1.005–1.030)
pH: 5 (ref 5.0–8.0)

## 2020-10-26 LAB — BASIC METABOLIC PANEL
Anion gap: 9 (ref 5–15)
BUN: 14 mg/dL (ref 6–20)
CO2: 27 mmol/L (ref 22–32)
Calcium: 9 mg/dL (ref 8.9–10.3)
Chloride: 102 mmol/L (ref 98–111)
Creatinine, Ser: <0.3 mg/dL — ABNORMAL LOW (ref 0.61–1.24)
Glucose, Bld: 92 mg/dL (ref 70–99)
Potassium: 4.1 mmol/L (ref 3.5–5.1)
Sodium: 138 mmol/L (ref 135–145)

## 2020-10-26 MED ORDER — ONDANSETRON HCL 4 MG PO TABS: 4.0000 mg | ORAL_TABLET | Freq: Four times a day (QID) | ORAL | Status: DC | PRN

## 2020-10-26 MED ORDER — POLYETHYLENE GLYCOL 3350 17 G PO PACK
17.0000 g | PACK | Freq: Every day | ORAL | Status: DC
  Administered 2020-10-27 – 2020-10-30 (×4): 17 g via ORAL
  Filled 2020-10-26 (×5): qty 1

## 2020-10-26 MED ORDER — GUAIFENESIN 100 MG/5ML PO SOLN: 15.0000 mL | Freq: Every day | ORAL | Status: DC | PRN

## 2020-10-26 MED ORDER — LACTATED RINGERS IV SOLN: Freq: Once | INTRAVENOUS | Status: AC

## 2020-10-26 MED ORDER — ENOXAPARIN SODIUM 30 MG/0.3ML ~~LOC~~ SOLN: 30.0000 mg | SUBCUTANEOUS | Status: DC

## 2020-10-26 MED ORDER — METOPROLOL TARTRATE 25 MG PO TABS
25.0000 mg | ORAL_TABLET | Freq: Three times a day (TID) | ORAL | Status: DC
  Administered 2020-10-26 – 2020-10-27 (×2): 25 mg via ORAL
  Filled 2020-10-26 (×3): qty 1

## 2020-10-26 MED ORDER — ONDANSETRON HCL 4 MG PO TABS: 4.0000 mg | ORAL_TABLET | Freq: Three times a day (TID) | ORAL | Status: DC | PRN

## 2020-10-26 MED ORDER — CEFAZOLIN SODIUM-DEXTROSE 1-4 GM/50ML-% IV SOLN
1.0000 g | Freq: Once | INTRAVENOUS | Status: AC
  Administered 2020-10-26: 1 g via INTRAVENOUS
  Filled 2020-10-26: qty 50

## 2020-10-26 MED ORDER — ACETAMINOPHEN 500 MG PO TABS
500.0000 mg | ORAL_TABLET | Freq: Four times a day (QID) | ORAL | Status: DC | PRN
  Administered 2020-10-30: 500 mg via ORAL
  Filled 2020-10-26: qty 1

## 2020-10-26 MED ORDER — ADULT MULTIVITAMIN W/MINERALS CH
1.0000 | ORAL_TABLET | Freq: Every day | ORAL | Status: DC
  Administered 2020-10-27 – 2020-11-01 (×5): 1 via ORAL
  Filled 2020-10-26 (×5): qty 1

## 2020-10-26 MED ORDER — MAGNESIUM OXIDE 400 (241.3 MG) MG PO TABS
400.0000 mg | ORAL_TABLET | Freq: Three times a day (TID) | ORAL | Status: DC
  Administered 2020-10-26 – 2020-11-01 (×13): 400 mg via ORAL
  Filled 2020-10-26 (×14): qty 1

## 2020-10-26 MED ORDER — ENSURE ENLIVE PO LIQD
237.0000 mL | Freq: Every day | ORAL | Status: DC | PRN
  Filled 2020-10-26: qty 237

## 2020-10-26 MED ORDER — IPRATROPIUM-ALBUTEROL 0.5-2.5 (3) MG/3ML IN SOLN: 3.0000 mL | Freq: Four times a day (QID) | RESPIRATORY_TRACT | Status: DC | PRN

## 2020-10-26 MED ORDER — SODIUM CHLORIDE 0.9 % IV SOLN
8.0000 mg/kg | Freq: Every day | INTRAVENOUS | Status: DC
  Administered 2020-10-26 – 2020-10-27 (×2): 280 mg via INTRAVENOUS
  Filled 2020-10-26 (×3): qty 5.6

## 2020-10-26 MED ORDER — SCOPOLAMINE 1 MG/3DAYS TD PT72
1.0000 | MEDICATED_PATCH | TRANSDERMAL | Status: DC
  Administered 2020-10-27: 1.5 mg via TRANSDERMAL
  Filled 2020-10-26 (×2): qty 1

## 2020-10-26 MED ORDER — ONDANSETRON HCL 4 MG/2ML IJ SOLN: 4.0000 mg | Freq: Four times a day (QID) | INTRAMUSCULAR | Status: DC | PRN

## 2020-10-26 NOTE — Progress Notes (Signed)
Pharmacy Antibiotic Note  Peter Hayes is a 25 y.o. male admitted on 10/26/2020 with fevers, bacteremia.  Pharmacy has been consulted for daptomyin dosing.  Pt is 63 yoM with PMH significant for muscular dystrophy, on chronic TPN. BCx obtained on 1/17 + staph species, ID initiating daptomycin.   Today, 10/26/20  TBW = 35 kg  WBC WNL  Plan:  Daptomycin 280 mg (8 mg/kg) IV q24h  Height: $Remove'5\' 5"'WOvxmml$  (165.1 cm) Weight: 35 kg (77 lb 2.6 oz) IBW/kg (Calculated) : 61.5  Temp (24hrs), Avg:98 F (36.7 C), Min:97.9 F (36.6 C), Max:98 F (36.7 C)  Recent Labs  Lab 10/24/20 2243 10/25/20 0010 10/26/20 1818  WBC 7.1  --  5.0  CREATININE <0.30* <0.30* <0.30*  LATICACIDVEN 0.7  --  0.6    CrCl cannot be calculated (This lab value cannot be used to calculate CrCl because it is not a number: <0.30).    Allergies  Allergen Reactions  . Fentanyl Other (See Comments)    Dizziness  . Other Other (See Comments)    As per father allergic to one more medicine, but don't know the name.   Antimicrobials this admission: daptomycin 1/19 >>   Dose adjustments this admission:  Microbiology results: 1/19 BCx:  1/17 +staph species  Thank you for allowing pharmacy to be a part of this patient's care.  Lenis Noon, PharmD 10/26/2020 8:14 PM

## 2020-10-26 NOTE — ED Triage Notes (Signed)
Patient BIB father and nurse, was at [facility] and got blood cultures, called him back today and said he had some positive blood cultures and he needs to return to the ED. Tested COVID - at [facility].

## 2020-10-26 NOTE — ED Provider Notes (Signed)
[facility] DEPT Provider Note   CSN: [id number] Arrival date & time: 10/26/20  1338     History Chief Complaint  Patient presents with  . positive blood cultures    Peter Hayes is a 25 y.o. male.  Patient called in for positive blood cultures best we can tell at least 2 that are growing coag negative staph aureus.  Patient was admitted in November for staph sepsis felt to be due to his PICC line.  PICC line was last changed at that time.  Patient was seen at [facility] on January 17 and discharged home early January 18 felt to have a viral illness.  Patient was having a fever that occurred that day.  Patient's father states that patient has not had any fever over 100 since then.  Patient's sister did have COVID.  His PCR test on the 17th was negative for flu and COVID.  Patient has a history of muscular dystrophy.  Patient has a colostomy.  Which father says is functioning well.  Patient has a PICC line in his left upper extremity.  Patient known to have decubiti type wounds.  But father says a week ago everything was completely healed.  They tend to occur on the buttock sacral area.  Do not occur on his lower extremities.  Does have contractures of his arms and legs.  Patient gets TPN through the PICC line.  In addition patient is on a CPAP BiPAP type breathing apparatus at all times.  Patient is able to verbalize.  Patient denies any significant problems.  They state there have been no fevers since the day of Jan 17.        Past Medical History:  Diagnosis Date  . Acute respiratory failure with hypoxia and hypercapnia ([place]) 05/16/2018  . HCAP (healthcare-associated pneumonia) 12/01/2017  . High anion gap metabolic acidosis 9/38/1829  . Hypotension   . Ileus ([place]) 09/23/2017  . Leukocytosis 05/05/2016  . MD (muscular dystrophy) ([place])   . Refusal of blood product    [name]  . Respiratory alkalosis 05/05/2016  . Sepsis ([place])  05/05/2016  . Severe sepsis ([place]) 09/16/2019  . Sinus tachycardia 05/05/2016    Patient Active Problem List   Diagnosis Date Noted  . Bacteremia 08/21/2020  . Abnormal LFTs 08/21/2020  . Acute metabolic encephalopathy 93/71/6967  . Pneumothorax on left 08/21/2020  . Sepsis ([place]) 06/14/2020  . Pneumomediastinum ([place]) 02/06/2020  . Pressure injury of skin 09/17/2019  . 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 Hypoglycemia without diagnosis of diabetes mellitus 09/24/2017  . Muscular dystrophy ([place]) 05/05/2016  . Moderate protein-calorie malnutrition ([place]) 05/05/2016    Past Surgical History:  Procedure Laterality Date  . COLOSTOMY    . EYE SURGERY    . FLEXIBLE SIGMOIDOSCOPY N/A 01/10/2018   Procedure: FLEXIBLE SIGMOIDOSCOPY;  Surgeon: Laurence Spates, MD;  Location: [facility];  Service: Endoscopy;  Laterality: N/A;  . SMALL INTESTINE SURGERY         No family history on file.  Social History   Tobacco Use  . Smoking status: Never Smoker  . Smokeless tobacco: Never Used  Vaping Use  . Vaping Use: Never used  Substance Use Topics  . Alcohol use: No  . Drug use: No    Home Medications Prior to Admission medications   Medication Sig Start Date End Date Taking? Authorizing Provider  acetaminophen (TYLENOL) 325 MG tablet Take 400 mg by  mouth every 6 (six) hours as needed for fever.   Yes [provider]  feeding supplement, ENSURE ENLIVE, (ENSURE ENLIVE) LIQD You can use whatever supplement he likes.  He needs about 1300 calories per day. 1.5 liters of fluid and 50 grams of protein per day. You can buy this at the grocery or drug store.  You do not need a prescription. Patient taking differently: Take 237 mLs by mouth daily as needed (protein intake). 06/21/20  Yes Nita Sells, MD  guaiFENesin (ROBITUSSIN) 100 MG/5ML SOLN Take 15 mLs (300 mg total) by mouth every 6 (six) hours. Patient taking differently: Take 15 mLs by mouth daily  as needed for cough. 06/21/20  Yes Nita Sells, MD  ipratropium-albuterol (DUONEB) 0.5-2.5 (3) MG/3ML SOLN Take 3 mLs by nebulization every 6 (six) hours as needed. Patient taking differently: Take 3 mLs by nebulization every 6 (six) hours as needed (breathing / congestion). 09/21/19  Yes Harold Hedge, MD  magnesium oxide (MAG-OX) 400 MG tablet Take 400 mg by mouth 3 (three) times daily.   Yes [provider]  metoprolol tartrate (LOPRESSOR) 25 MG tablet Take 25 mg by mouth every 8 (eight) hours. 08/09/20  Yes [provider]  Multiple Vitamin (MULTIVITAMIN WITH MINERALS) TABS tablet Take 1 tablet by mouth daily.   Yes [provider]  ondansetron (ZOFRAN) 4 MG tablet Take 1 tablet (4 mg total) by mouth every 6 (six) hours as needed for nausea. Patient taking differently: Take 4 mg by mouth every 8 (eight) hours as needed for nausea. 06/21/20  Yes Nita Sells, MD  polyethylene glycol (MIRALAX / Floria Raveling) packet Follow package instruction for daily use.  You need to have one soft bowel movement per day.  If he is not doing this call your primary care doctor.  You also need to be sure he takes in 1.5 liters of fluid per day.   He needs protein supplement daily, and needs to take in about 1300 calories per day. Patient taking differently: Take 17 g by mouth daily. 01/15/18  Yes Earnstine Regal, PA-C  scopolamine (TRANSDERM-SCOP) 1 MG/3DAYS Place 1 patch (1.5 mg total) onto the skin every 3 (three) days. 06/23/20  Yes Nita Sells, MD  sterile water SOLN with amino acids 10 % SOLN 1.3 g/kg, dextrose 70 % SOLN 20 % Inject 1,000 mLs into the vein continuous. TPN 1000 ml w/ lipids  Directions: Infuse TPN 1071ml IV through PICC line via CADD SOLIS infusion pump daily over 18 hours with 1 hr ramp up and 1 hr ramp down. Add MVI 10 ml as directed to each bag prior to infusion. MIX WELL VTBI: 1000 ML Bag Volume  1050 ml     Dosing Weight for the patient 72 lbs    Yes [provider]  Catheters (ARGYLE SUCTION CATHETER 14FR) MISC 1 packet by Does not apply route daily. 08/27/20   Charlynne Cousins, MD  ceFAZolin (ANCEF) 2-4 GM/100ML-% IVPB Inject 100 mLs (2 g total) into the vein every 8 (eight) hours. Patient not taking: No sig reported 08/27/20   Charlynne Cousins, MD    Allergies    Fentanyl and Other  Review of Systems   Review of Systems  Constitutional: Positive for fever. Negative for chills.  HENT: Negative for rhinorrhea and sore throat.   Eyes: Negative for visual disturbance.  Respiratory: Negative for cough and shortness of breath.   Cardiovascular: Negative for chest pain and leg swelling.  Gastrointestinal: Negative for abdominal pain,  diarrhea, nausea and vomiting.  Genitourinary: Negative for dysuria.  Musculoskeletal: Negative for back pain and neck pain.  Skin: Negative for rash.  Neurological: Negative for dizziness, light-headedness and headaches.  Hematological: Does not bruise/bleed easily.  Psychiatric/Behavioral: Negative for confusion.    Physical Exam Updated Vital Signs BP 106/67   Pulse (!) 102   Temp 98 F (36.7 C) (Oral)   Resp 20   Ht 1.651 m ($Remove'5\' 5"'DFlbIyn$ )   Wt 35 kg   SpO2 99%   BMI 12.84 kg/m   Physical Exam Vitals and nursing note reviewed.  Constitutional:      General: He is not in acute distress.    Appearance: Normal appearance. He is well-developed and well-nourished.  HENT:     Head: Normocephalic and atraumatic.  Eyes:     Extraocular Movements: Extraocular movements intact.     Conjunctiva/sclera: Conjunctivae normal.     Pupils: Pupils are equal, round, and reactive to light.  Cardiovascular:     Rate and Rhythm: Normal rate and regular rhythm.     Heart sounds: No murmur heard.   Pulmonary:     Effort: Pulmonary effort is normal. No respiratory distress.     Breath sounds: Normal breath sounds.  Abdominal:     Palpations: Abdomen is soft.     Tenderness: There is no  abdominal tenderness.     Comments: Colostomy left lower quadrant.  Healthy in appearance  Musculoskeletal:        General: No edema. Normal range of motion.     Cervical back: Normal range of motion and neck supple.     Comments: PICC line in the left upper extremity.  Wound site appears to be without evidence of infection  Skin:    General: Skin is warm and dry.     Capillary Refill: Capillary refill takes less than 2 seconds.  Neurological:     Mental Status: He is alert. Mental status is at baseline.     Comments: Patient awake and alert and answers questions appropriately.  Psychiatric:        Mood and Affect: Mood and affect normal.     ED Results / Procedures / Treatments   Labs (all labs ordered are listed, but only abnormal results are displayed) Labs Reviewed  BASIC METABOLIC PANEL - Abnormal; Notable for the following components:      Result Value   Creatinine, Ser <0.30 (*)    All other components within normal limits  CULTURE, BLOOD (ROUTINE X 2)  CULTURE, BLOOD (ROUTINE X 2)  SARS CORONAVIRUS 2 (TAT 6-24 HRS)  LACTIC ACID, PLASMA  LACTIC ACID, PLASMA  CBC WITH DIFFERENTIAL/PLATELET  URINALYSIS, ROUTINE W REFLEX MICROSCOPIC    EKG None  Radiology DG Chest 2 View  Result Date: 10/26/2020 CLINICAL DATA:  Bacteremia EXAM: CHEST - 2 VIEW COMPARISON:  10/24/2020 FINDINGS: Rotated. No new consolidation or edema. No pleural effusion. No pneumothorax. Stable cardiomediastinal contours. Left PICC line is unchanged. IMPRESSION: No acute process in the chest. Electronically Signed   By: Macy Mis M.D.   On: 10/26/2020 15:25   DG Chest Port 1 View  Result Date: 10/24/2020 CLINICAL DATA:  None COVID exposure 1 day ago with fevers, initial encounter EXAM: PORTABLE CHEST 1 VIEW COMPARISON:  08/26/2020 FINDINGS: Cardiac shadow is within normal limits. The lungs are well aerated bilaterally. Mild scarring is noted stable from the prior exam. No acute infiltrate or sizable  effusion is seen. No bony abnormality is noted. IMPRESSION: No  acute abnormality noted. Electronically Signed   By: Inez Catalina M.D.   On: 10/24/2020 21:16    Procedures Procedures (including critical care time)  CRITICAL CARE Performed by: Fredia Sorrow Total critical care time: 35 minutes Critical care time was exclusive of separately billable procedures and treating other patients. Critical care was necessary to treat or prevent imminent or life-threatening deterioration. Critical care was time spent personally by me on the following activities: development of treatment plan with patient and/or surrogate as well as nursing, discussions with consultants, evaluation of patient's response to treatment, examination of patient, obtaining history from patient or surrogate, ordering and performing treatments and interventions, ordering and review of laboratory studies, ordering and review of radiographic studies, pulse oximetry and re-evaluation of patient's condition.   Medications Ordered in ED Medications  ceFAZolin (ANCEF) IVPB 1 g/50 mL premix (0 g Intravenous Stopped 10/26/20 1854)    ED Course  I have reviewed the triage vital signs and the nursing notes.  Pertinent labs & imaging results that were available during my care of the patient were reviewed by me and considered in my medical decision making (see chart for details).    MDM Rules/Calculators/A&P                          Patient seems to be baseline.  Patient is not toxic.  Labs seem to be baseline for him.  Lactic acid not elevated.  Concern is that patient truly does have a staff aureus infection.  May very well be related to the PICC line again.  Hospitalist contacted to discuss with infectious disease.  Initiate antibiotics.  Cultures have been redrawn.  Hospitalist will admit.  Patient's COVID testing was negative couple days ago.  And have retested him.  On the 6 to 12-hour test.  Final Clinical Impression(s) / ED  Diagnoses Final diagnoses:  Positive blood cultures    Rx / DC Orders ED Discharge Orders    None       Fredia Sorrow, MD 10/26/20 2128

## 2020-10-26 NOTE — H&P (Signed)
History and Physical    Peter Hayes NOT:[id number] DOB: [birth year] DOA: 10/26/2020  PCP: Penni Bombard, PA  Patient coming from: Home  I have personally briefly reviewed patient's old medical records in [facility]  Chief Complaint: Bacteremia  HPI: Peter Hayes is a 25 y.o. male with medical history significant of MD, chronic ileus s/p ostomy, chronic respiratory failure on chronic BIPAP, pt on chronic TPN through PICC line.  Pt had Line sepsis with S.Auerus in Nov.  [name] has had COVID, pt unvaccinated.  Pt seen in ED on 1/17 for fever.  COVID and flu neg.  BCx obtained.  BCx have come back positive: looks like 4/4 bottles for a CNS.  Pt back in to ED after being called with this.  No fever over 100 since that time in ED.  No SOB, decubitus ulcers apparently all healed up a this point.   ED Course: WBC nl.  No fever.   Review of Systems: As per HPI, otherwise all review of systems negative.  Past Medical History:  Diagnosis Date  . Acute respiratory failure with hypoxia and hypercapnia ([place]) 05/16/2018  . HCAP (healthcare-associated pneumonia) 12/01/2017  . High anion gap metabolic acidosis 3/83/3383  . Hypotension   . Ileus ([place]) 09/23/2017  . Leukocytosis 05/05/2016  . MD (muscular dystrophy) ([place])   . Refusal of blood product    patient is Peter Hayes witness  . Respiratory alkalosis 05/05/2016  . Sepsis ([place]) 05/05/2016  . Severe sepsis ([place]) 09/16/2019  . Sinus tachycardia 05/05/2016    Past Surgical History:  Procedure Laterality Date  . COLOSTOMY    . EYE SURGERY    . FLEXIBLE SIGMOIDOSCOPY N/A 01/10/2018   Procedure: FLEXIBLE SIGMOIDOSCOPY;  Surgeon: Laurence Spates, MD;  Location: [facility];  Service: Endoscopy;  Laterality: N/A;  . SMALL INTESTINE SURGERY       reports that he has never smoked. He has never used smokeless tobacco. He reports that he does not drink alcohol and does not use drugs.  Allergies  Allergen Reactions  . Fentanyl Other  (See Comments)    Dizziness  . Other Other (See Comments)    As per father allergic to one more medicine, but don't know the name.    No family history on file. Sister recently COVID+.  Prior to Admission medications   Medication Sig Start Date End Date Taking? Authorizing Provider  acetaminophen (TYLENOL) 325 MG tablet Take 400 mg by mouth every 6 (six) hours as needed for fever.   Yes [provider]  feeding supplement, ENSURE ENLIVE, (ENSURE ENLIVE) LIQD You can use whatever supplement he likes.  He needs about 1300 calories per day. 1.5 liters of fluid and 50 grams of protein per day. You can buy this at the grocery or drug store.  You do not need a prescription. Patient taking differently: Take 237 mLs by mouth daily as needed (protein intake). 06/21/20  Yes Nita Sells, MD  guaiFENesin (ROBITUSSIN) 100 MG/5ML SOLN Take 15 mLs (300 mg total) by mouth every 6 (six) hours. Patient taking differently: Take 15 mLs by mouth daily as needed for cough. 06/21/20  Yes Nita Sells, MD  ipratropium-albuterol (DUONEB) 0.5-2.5 (3) MG/3ML SOLN Take 3 mLs by nebulization every 6 (six) hours as needed. Patient taking differently: Take 3 mLs by nebulization every 6 (six) hours as needed (breathing / congestion). 09/21/19  Yes Harold Hedge, MD  magnesium oxide (MAG-OX) 400 MG tablet Take 400 mg by mouth 3 (three) times  daily.   Yes [provider]  metoprolol tartrate (LOPRESSOR) 25 MG tablet Take 25 mg by mouth every 8 (eight) hours. 08/09/20  Yes [provider]  Multiple Vitamin (MULTIVITAMIN WITH MINERALS) TABS tablet Take 1 tablet by mouth daily.   Yes [provider]  ondansetron (ZOFRAN) 4 MG tablet Take 1 tablet (4 mg total) by mouth every 6 (six) hours as needed for nausea. Patient taking differently: Take 4 mg by mouth every 8 (eight) hours as needed for nausea. 06/21/20  Yes Nita Sells, MD  polyethylene glycol (MIRALAX / Floria Raveling)  packet Follow package instruction for daily use.  You need to have one soft bowel movement per day.  If he is not doing this call your primary care doctor.  You also need to be sure he takes in 1.5 liters of fluid per day.   He needs protein supplement daily, and needs to take in about 1300 calories per day. Patient taking differently: Take 17 g by mouth daily. 01/15/18  Yes Earnstine Regal, PA-C  scopolamine (TRANSDERM-SCOP) 1 MG/3DAYS Place 1 patch (1.5 mg total) onto the skin every 3 (three) days. 06/23/20  Yes Nita Sells, MD  sterile water SOLN with amino acids 10 % SOLN 1.3 g/kg, dextrose 70 % SOLN 20 % Inject 1,000 mLs into the vein continuous. TPN 1000 ml w/ lipids  Directions: Infuse TPN 1056ml IV through PICC line via CADD SOLIS infusion pump daily over 18 hours with 1 hr ramp up and 1 hr ramp down. Add MVI 10 ml as directed to each bag prior to infusion. MIX WELL VTBI: 1000 ML Bag Volume  1050 ml     Dosing Weight for the patient 72 lbs   Yes [provider]  Catheters (ARGYLE SUCTION CATHETER 14FR) MISC 1 packet by Does not apply route daily. 08/27/20   Charlynne Cousins, MD    Physical Exam: Vitals:   10/26/20 1815 10/26/20 1830 10/26/20 1930 10/26/20 2000  BP: 110/70 106/67 108/77 105/69  Pulse: (!) 102 (!) 102 (!) 105   Resp:  20 (!) 32 (!) 26  Temp:      TempSrc:      SpO2: 99% 99% 100% 100%  Weight:      Height:        Constitutional: NAD, calm, comfortable Eyes: PERRL, lids and conjunctivae normal ENMT: Mucous membranes are moist. Posterior pharynx clear of any exudate or lesions.Normal dentition.  Neck: normal, supple, no masses, no thyromegaly Respiratory: clear to auscultation bilaterally, no wheezing, no crackles. Normal respiratory effort. No accessory muscle use.  Cardiovascular: Regular rate and rhythm, no murmurs / rubs / gallops. No extremity edema. 2+ pedal pulses. No carotid bruits.  Abdomen: no tenderness, no masses palpated. No  hepatosplenomegaly. Bowel sounds positive.  Musculoskeletal: Chronic contractures. Skin: no rashes, lesions, ulcers. No induration Neurologic: CN 2-12 grossly intact. Sensation intact, DTR normal. Strength 5/5 in all 4.  Psychiatric: Normal judgment and insight. Alert and oriented x 3. Normal mood.    Labs on Admission: I have personally reviewed following labs and imaging studies  CBC: Recent Labs  Lab 10/24/20 2243 10/26/20 1818  WBC 7.1 5.0  NEUTROABS 6.0 3.6  HGB 11.5* 11.4*  HCT 36.1* 35.9*  MCV 84.7 83.1  PLT 102* 93*   Basic Metabolic Panel: Recent Labs  Lab 10/24/20 2243 10/25/20 0010 10/26/20 1818  NA 125* 133* 138  K >7.5* 4.4 4.1  CL 96* 99 102  CO2 $Re'22 23 27  'kcq$ GLUCOSE 1,011*  100* 92  BUN $Re'15 14 14  'jPa$ CREATININE <0.30* <0.30* <0.30*  CALCIUM 7.6* 8.7* 9.0   GFR: CrCl cannot be calculated (This lab value cannot be used to calculate CrCl because it is not a number: <0.30). Liver Function Tests: Recent Labs  Lab 10/24/20 2243 10/25/20 0010  AST 65* 76*  ALT 81* 96*  ALKPHOS 117 138*  BILITOT 0.4 0.7  PROT 6.7 7.6  ALBUMIN 3.5 4.0   No results for input(s): LIPASE, AMYLASE in the last 168 hours. No results for input(s): AMMONIA in the last 168 hours. Coagulation Profile: No results for input(s): INR, PROTIME in the last 168 hours. Cardiac Enzymes: No results for input(s): CKTOTAL, CKMB, CKMBINDEX, TROPONINI in the last 168 hours. BNP (last 3 results) No results for input(s): PROBNP in the last 8760 hours. HbA1C: No results for input(s): HGBA1C in the last 72 hours. CBG: Recent Labs  Lab 10/24/20 2350  GLUCAP 81   Lipid Profile: No results for input(s): CHOL, HDL, LDLCALC, TRIG, CHOLHDL, LDLDIRECT in the last 72 hours. Thyroid Function Tests: No results for input(s): TSH, T4TOTAL, FREET4, T3FREE, THYROIDAB in the last 72 hours. Anemia Panel: No results for input(s): VITAMINB12, FOLATE, FERRITIN, TIBC, IRON, RETICCTPCT in the last 72  hours. Urine analysis:    Component Value Date/Time   COLORURINE YELLOW 10/26/2020 1427   APPEARANCEUR HAZY (A) 10/26/2020 1427   LABSPEC 1.023 10/26/2020 1427   PHURINE 5.0 10/26/2020 1427   GLUCOSEU NEGATIVE 10/26/2020 1427   HGBUR NEGATIVE 10/26/2020 1427   BILIRUBINUR NEGATIVE 10/26/2020 1427   KETONESUR 20 (A) 10/26/2020 1427   PROTEINUR NEGATIVE 10/26/2020 1427   NITRITE NEGATIVE 10/26/2020 1427   LEUKOCYTESUR NEGATIVE 10/26/2020 1427    Radiological Exams on Admission: DG Chest 2 View  Result Date: 10/26/2020 CLINICAL DATA:  Bacteremia EXAM: CHEST - 2 VIEW COMPARISON:  10/24/2020 FINDINGS: Rotated. No new consolidation or edema. No pleural effusion. No pneumothorax. Stable cardiomediastinal contours. Left PICC line is unchanged. IMPRESSION: No acute process in the chest. Electronically Signed   By: Macy Mis M.D.   On: 10/26/2020 15:25   DG Chest Port 1 View  Result Date: 10/24/2020 CLINICAL DATA:  None COVID exposure 1 day ago with fevers, initial encounter EXAM: PORTABLE CHEST 1 VIEW COMPARISON:  08/26/2020 FINDINGS: Cardiac shadow is within normal limits. The lungs are well aerated bilaterally. Mild scarring is noted stable from the prior exam. No acute infiltrate or sizable effusion is seen. No bony abnormality is noted. IMPRESSION: No acute abnormality noted. Electronically Signed   By: Inez Catalina M.D.   On: 10/24/2020 21:16    EKG: Independently reviewed.  Assessment/Plan Principal Problem:   Coag negative Staphylococcus bacteremia Active Problems:   Muscular dystrophy ([place])   On total parenteral nutrition (TPN)   Bacteremia due to coagulase-negative Staphylococcus    1. CNS bacteremia - 1. Repeat Cx ordered for tonight 2. Spoke with Dr. Tommy Medal 1. Dapto for the moment 2. Source: PICC vs ulcers 3. But ulcers are healed up. 4. PICC line sounds like most likely source 1. Discuss with ID in AM: save line vs replace line. 5. Repeat labs in AM 2. On TPN  - 1. Cont TPN 3. MD - 1. Chronic and baseline 2. Cont BIPAP  DVT prophylaxis: SCDs - mild thrombocytopenia Code Status: Full Family Communication: Father at bedside Disposition Plan: Home after bacteremia treatment Consults called: Spoke with Dr. Tommy Medal Admission status: Place in [place], [place]. DO  [facility]  How to contact the [facility] Attending or Consulting provider [place] or covering provider during after hours [place], for this patient?  1. Check the care team in [facility] and look for a) attending/consulting [facility] provider listed and b) the [facility] team listed 2. Log into [email or web address]  [employer] Physician Scheduling and messaging for groups and whole hospitals  On call and physician scheduling software for group practices, residents, hospitalists and other medical providers for call, clinic, rotation and shift schedules. [employer] is a hospital-wide system for scheduling doctors and paging doctors on call. EasyPlot is for scientific plotting and data analysis.  [email or web address]  and use [place]'s universal password to access. If you do not have the password, please contact the hospital operator.  3. Locate the [facility] provider you are looking for under [facility] and page to a number that you can be directly reached. 4. If you still have difficulty reaching the provider, please page the [facility] (Director on Call) for the Hospitalists listed on amion for assistance.  10/26/2020, 9:12 PM

## 2020-10-27 ENCOUNTER — Observation Stay (HOSPITAL_COMMUNITY): Payer: Managed Care, Other (non HMO)

## 2020-10-27 DIAGNOSIS — Z885 Allergy status to narcotic agent status: Secondary | ICD-10-CM | POA: Diagnosis not present

## 2020-10-27 DIAGNOSIS — G7100 Muscular dystrophy, unspecified: Secondary | ICD-10-CM | POA: Diagnosis present

## 2020-10-27 DIAGNOSIS — R7881 Bacteremia: Secondary | ICD-10-CM

## 2020-10-27 DIAGNOSIS — Y848 Other medical procedures as the cause of abnormal reaction of the patient, or of later complication, without mention of misadventure at the time of the procedure: Secondary | ICD-10-CM | POA: Diagnosis present

## 2020-10-27 DIAGNOSIS — Z789 Other specified health status: Secondary | ICD-10-CM

## 2020-10-27 DIAGNOSIS — Z933 Colostomy status: Secondary | ICD-10-CM | POA: Diagnosis not present

## 2020-10-27 DIAGNOSIS — E43 Unspecified severe protein-calorie malnutrition: Secondary | ICD-10-CM | POA: Diagnosis present

## 2020-10-27 DIAGNOSIS — B9561 Methicillin susceptible Staphylococcus aureus infection as the cause of diseases classified elsewhere: Secondary | ICD-10-CM | POA: Diagnosis present

## 2020-10-27 DIAGNOSIS — L89309 Pressure ulcer of unspecified buttock, unspecified stage: Secondary | ICD-10-CM

## 2020-10-27 DIAGNOSIS — Z681 Body mass index (BMI) 19 or less, adult: Secondary | ICD-10-CM | POA: Diagnosis not present

## 2020-10-27 DIAGNOSIS — B957 Other staphylococcus as the cause of diseases classified elsewhere: Secondary | ICD-10-CM | POA: Diagnosis present

## 2020-10-27 DIAGNOSIS — Z9981 Dependence on supplemental oxygen: Secondary | ICD-10-CM | POA: Diagnosis not present

## 2020-10-27 DIAGNOSIS — Z23 Encounter for immunization: Secondary | ICD-10-CM | POA: Diagnosis not present

## 2020-10-27 DIAGNOSIS — J9611 Chronic respiratory failure with hypoxia: Secondary | ICD-10-CM | POA: Diagnosis present

## 2020-10-27 DIAGNOSIS — Z79899 Other long term (current) drug therapy: Secondary | ICD-10-CM | POA: Diagnosis not present

## 2020-10-27 DIAGNOSIS — G909 Disorder of the autonomic nervous system, unspecified: Secondary | ICD-10-CM | POA: Diagnosis present

## 2020-10-27 DIAGNOSIS — A419 Sepsis, unspecified organism: Secondary | ICD-10-CM | POA: Diagnosis present

## 2020-10-27 DIAGNOSIS — Z20822 Contact with and (suspected) exposure to covid-19: Secondary | ICD-10-CM | POA: Diagnosis present

## 2020-10-27 DIAGNOSIS — K567 Ileus, unspecified: Secondary | ICD-10-CM | POA: Diagnosis present

## 2020-10-27 DIAGNOSIS — D696 Thrombocytopenia, unspecified: Secondary | ICD-10-CM | POA: Diagnosis present

## 2020-10-27 DIAGNOSIS — T80211A Bloodstream infection due to central venous catheter, initial encounter: Secondary | ICD-10-CM | POA: Diagnosis present

## 2020-10-27 LAB — DIFFERENTIAL
Abs Immature Granulocytes: 0.03 10*3/uL (ref 0.00–0.07)
Basophils Absolute: 0 10*3/uL (ref 0.0–0.1)
Basophils Relative: 0 %
Eosinophils Absolute: 0 10*3/uL (ref 0.0–0.5)
Eosinophils Relative: 0 %
Immature Granulocytes: 1 %
Lymphocytes Relative: 13 %
Lymphs Abs: 0.6 10*3/uL — ABNORMAL LOW (ref 0.7–4.0)
Monocytes Absolute: 0.6 10*3/uL (ref 0.1–1.0)
Monocytes Relative: 12 %
Neutro Abs: 3.6 10*3/uL (ref 1.7–7.7)
Neutrophils Relative %: 74 %

## 2020-10-27 LAB — MAGNESIUM: Magnesium: 1.6 mg/dL — ABNORMAL LOW (ref 1.7–2.4)

## 2020-10-27 LAB — CBC
HCT: 38.6 % — ABNORMAL LOW (ref 39.0–52.0)
Hemoglobin: 12 g/dL — ABNORMAL LOW (ref 13.0–17.0)
MCH: 26 pg (ref 26.0–34.0)
MCHC: 31.1 g/dL (ref 30.0–36.0)
MCV: 83.5 fL (ref 80.0–100.0)
Platelets: UNDETERMINED 10*3/uL (ref 150–400)
RBC: 4.62 MIL/uL (ref 4.22–5.81)
RDW: 15.1 % (ref 11.5–15.5)
WBC: 4.9 10*3/uL (ref 4.0–10.5)
nRBC: 0 % (ref 0.0–0.2)

## 2020-10-27 LAB — COMPREHENSIVE METABOLIC PANEL
ALT: 154 U/L — ABNORMAL HIGH (ref 0–44)
AST: 191 U/L — ABNORMAL HIGH (ref 15–41)
Albumin: 3.8 g/dL (ref 3.5–5.0)
Alkaline Phosphatase: 152 U/L — ABNORMAL HIGH (ref 38–126)
Anion gap: 18 — ABNORMAL HIGH (ref 5–15)
BUN: 17 mg/dL (ref 6–20)
CO2: 21 mmol/L — ABNORMAL LOW (ref 22–32)
Calcium: 9.4 mg/dL (ref 8.9–10.3)
Chloride: 100 mmol/L (ref 98–111)
Creatinine, Ser: <0.3 mg/dL — ABNORMAL LOW (ref 0.61–1.24)
Glucose, Bld: 32 mg/dL — CL (ref 70–99)
Potassium: 3.9 mmol/L (ref 3.5–5.1)
Sodium: 139 mmol/L (ref 135–145)
Total Bilirubin: 2.1 mg/dL — ABNORMAL HIGH (ref 0.3–1.2)
Total Protein: 7.5 g/dL (ref 6.5–8.1)

## 2020-10-27 LAB — PREALBUMIN: Prealbumin: 11 mg/dL — ABNORMAL LOW (ref 18–38)

## 2020-10-27 LAB — GLUCOSE, CAPILLARY
Glucose-Capillary: 64 mg/dL — ABNORMAL LOW (ref 70–99)
Glucose-Capillary: 79 mg/dL (ref 70–99)

## 2020-10-27 LAB — ECHOCARDIOGRAM COMPLETE
Area-P 1/2: 8.62 cm2
Height: 65 in
S' Lateral: 2.8 cm
Weight: 1389.78 oz

## 2020-10-27 LAB — PHOSPHORUS: Phosphorus: 3.8 mg/dL (ref 2.5–4.6)

## 2020-10-27 LAB — TRIGLYCERIDES: Triglycerides: 95 mg/dL (ref <150)

## 2020-10-27 LAB — SARS CORONAVIRUS 2 (TAT 6-24 HRS): SARS Coronavirus 2: NEGATIVE

## 2020-10-27 LAB — CK: Total CK: 203 U/L (ref 49–397)

## 2020-10-27 MED ORDER — KCL IN DEXTROSE-NACL 20-5-0.45 MEQ/L-%-% IV SOLN
INTRAVENOUS | Status: AC
  Filled 2020-10-27 (×6): qty 1000

## 2020-10-27 MED ORDER — CHLORHEXIDINE GLUCONATE CLOTH 2 % EX PADS
6.0000 | MEDICATED_PAD | Freq: Every day | CUTANEOUS | Status: DC
  Administered 2020-10-27 – 2020-11-01 (×4): 6 via TOPICAL

## 2020-10-27 NOTE — Consult Note (Signed)
Date of Admission:  10/26/2020          Reason for Consult: Coagulase negative staphylococcal bacteremia likely due to a line   Referring Provider: Dr. Benny Lennert   Assessment:  1. Coagulase negative staphylococcal bacteremia likely due to line infection 2. Recent methicillin sensitive Staph aureus bacteremia status post 4 weeks of cefazolin (TTE was negative for endocarditis) 3. Decubitus ulcer which is healing 4. 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 Sister with COVID-19 3 weeks ago 6. Patient unvaccinated for COVID  Plan:  1. Continue daptomycin 2. PICC line has been discontinued, I would leave out for 2 days 3. Hold TPN x2 days 4. Repeat blood cultures later today 5. 2D echocardiogram has been obtained 6. Patient desires COVID-19 vaccination while inpatient 7. Ultimately consider G-tube placement to liberate him from PICC and TPN  Principal Problem:   Coag negative Staphylococcus bacteremia Active Problems:   Muscular dystrophy (Peter Hayes)   On total parenteral nutrition (TPN)   Bacteremia due to coagulase-negative Staphylococcus   Scheduled Meds: . Chlorhexidine Gluconate Cloth  6 each Topical Daily  . magnesium oxide  400 mg Oral TID  . metoprolol tartrate  25 mg Oral Q8H  . multivitamin with minerals  1 tablet Oral Daily  . polyethylene glycol  17 g Oral Daily  . scopolamine  1 patch Transdermal Q72H   Continuous Infusions: . DAPTOmycin (CUBICIN)  IV Stopped (10/26/20 2310)  . dextrose 5 % and 0.45 % NaCl with KCl 20 mEq/L 75 mL/hr at 10/27/20 1004   PRN Meds:.acetaminophen, feeding supplement, guaiFENesin, ipratropium-albuterol, ondansetron **OR** ondansetron (ZOFRAN) IV  HPI: Peter Hayes is a 25 y.o. male with muscular dystrophy, respiratory failure on chronic BiPAP ileus status post diverting ostomy with TPN via PICC line who was admitted to [facility] long hospital in November with methicillin sensitive staph  coccus aureus bacteremia thought to be due to his PICC line.  His PICC line was removed he underwent transthoracic echocardiogram which was negative.  He was given a total of 4 weeks of cefazolin which she completed in mid December.  New PICC line that was placed after he cleared his bacteremia has remained in place while he gets TPN.  Patient had developed fever on January 17 and came to the ER.  He was tested negative for COVID and influenza blood cultures were obtained of came back positive for coagulase-negative staphylococcal species.  He has been brought into the hospital and was started initially on vancomycin which I had then changed to daptomycin.  He has a decubitus ulcer but it is healing up well.  PICC line seems like obvious source of infection and has been removed this morning.  I will obtain blood cultures peripherally later today.  I would keep PICC line out for at least 2 days and ensure that repeat blood cultures are negative prior to insertion of new line.  Would also revisit the idea of him having a G-tube placed if possible to obviate the need for PICC line.  Of note the patient's sister had COVID-19 infection but this was 3 weeks ago.  She isolated herself from the rest of the family for 10 days.  The patient himself has again tested negative for COVID and not had any recent contacts.  He is interested in being vaccinated for COVID-19 and we should accommodate this while he is an inpatient.      PICC line remained in place   Review of  Systems: Review of Systems  Constitutional: Positive for chills and fever. Negative for diaphoresis, malaise/fatigue and weight loss.  HENT: Negative for congestion, ear pain, hearing loss and sore throat.   Eyes: Negative for blurred vision and double vision.  Respiratory: Positive for shortness of breath. Negative for cough, sputum production, wheezing and stridor.   Cardiovascular: Negative for chest pain, palpitations and leg  swelling.  Gastrointestinal: Negative for abdominal pain, diarrhea, heartburn, nausea and vomiting.  Genitourinary: Negative for dysuria, flank pain and frequency.  Musculoskeletal: Negative for joint pain and myalgias.  Neurological: Negative for dizziness, sensory change, focal weakness, loss of consciousness, weakness and headaches.  Endo/Heme/Allergies: Does not bruise/bleed easily.  Psychiatric/Behavioral: Negative for depression, substance abuse and suicidal ideas. The patient does not have insomnia.     Past Medical History:  Diagnosis Date  . Acute respiratory failure with hypoxia and hypercapnia ([place]) 05/16/2018  . HCAP (healthcare-associated pneumonia) 12/01/2017  . High anion gap metabolic acidosis 6/96/2952  . Hypotension   . Ileus ([place]) 09/23/2017  . Leukocytosis 05/05/2016  . MD (muscular dystrophy) ([place])   . Refusal of blood product    patient is Peter Hayes witness  . Respiratory alkalosis 05/05/2016  . Sepsis ([place]) 05/05/2016  . Severe sepsis ([place]) 09/16/2019  . Sinus tachycardia 05/05/2016    Social History   Tobacco Use  . Smoking status: Never Smoker  . Smokeless tobacco: Never Used  Vaping Use  . Vaping Use: Never used  Substance Use Topics  . Alcohol use: No  . Drug use: No    No family history on file. Allergies  Allergen Reactions  . Fentanyl Other (See Comments)    Dizziness  . Other Other (See Comments)    As per father allergic to one more medicine, but don't know the name.    OBJECTIVE: Blood pressure (!) 105/55, pulse 99, temperature 98 F (36.7 C), resp. rate 16, height $RemoveBe'5\' 5"'zAwUbVbwm$  (1.651 m), weight 39.4 kg, SpO2 99 %.  Physical Exam Constitutional:      General: He is not in acute distress.    Appearance: Normal appearance. He is well-developed and well-nourished. He is not ill-appearing or diaphoretic.  HENT:     Head: Normocephalic and atraumatic.     Right Ear: Hearing and external ear normal.     Left Ear: Hearing and external ear normal.      Nose: No nasal deformity, rhinorrhea or epistaxis.  Eyes:     General: No scleral icterus.    Extraocular Movements: EOM normal.     Conjunctiva/sclera: Conjunctivae normal.     Right eye: Right conjunctiva is not injected.     Left eye: Left conjunctiva is not injected.     Pupils: Pupils are equal, round, and reactive to light.  Neck:     Vascular: No JVD.  Cardiovascular:     Rate and Rhythm: Normal rate and regular rhythm.     Heart sounds: Normal heart sounds, S1 normal and S2 normal. No murmur heard. No friction rub. No gallop.   Pulmonary:     Effort: No respiratory distress.     Breath sounds: No rhonchi.  Chest:     Chest wall: No tenderness.  Abdominal:     General: Bowel sounds are normal. There is no distension or ascites.     Palpations: Abdomen is soft. There is no hepatosplenomegaly.     Tenderness: There is no abdominal tenderness.  Musculoskeletal:        General: Normal range of  motion.     Right shoulder: Normal.     Left shoulder: Normal.     Cervical back: Normal range of motion and neck supple.     Right hip: Normal.     Left hip: Normal.     Right knee: Normal.     Left knee: Normal.  Lymphadenopathy:     Head:     Right side of head: No submandibular, preauricular or posterior auricular adenopathy.     Left side of head: No submandibular, preauricular or posterior auricular adenopathy.     Cervical: No cervical adenopathy.     Right cervical: No superficial or deep cervical adenopathy.    Left cervical: No superficial or deep cervical adenopathy.  Skin:    General: Skin is warm, dry and intact.     Coloration: Skin is not pale.     Findings: No abrasion, bruising, ecchymosis, erythema, lesion or rash.     Nails: There is no clubbing or cyanosis.  Neurological:     Mental Status: He is alert and oriented to person, place, and time.     Sensory: No sensory deficit.     Coordination: Coordination normal.     Gait: Gait normal.     Deep Tendon  Reflexes: Strength normal.  Psychiatric:        Attention and Perception: Attention normal. He is attentive.        Mood and Affect: Mood is anxious.        Speech: Speech normal.        Behavior: Behavior normal. Behavior is cooperative.        Thought Content: Thought content normal.        Cognition and Memory: Cognition and memory normal.        Judgment: Judgment normal.    Decubitus ulcer 10/27/2020:        Lab Results Lab Results  Component Value Date   WBC 5.0 10/26/2020   HGB 11.4 (L) 10/26/2020   HCT 35.9 (L) 10/26/2020   MCV 83.1 10/26/2020   PLT 93 (L) 10/26/2020    Lab Results  Component Value Date   CREATININE <0.30 (L) 10/26/2020   BUN 14 10/26/2020   NA 138 10/26/2020   K 4.1 10/26/2020   CL 102 10/26/2020   CO2 27 10/26/2020    Lab Results  Component Value Date   ALT 96 (H) 10/25/2020   AST 76 (H) 10/25/2020   ALKPHOS 138 (H) 10/25/2020   BILITOT 0.7 10/25/2020     Microbiology: Recent Results (from the past 240 hour(s))  Resp Panel by RT-PCR (Flu A&B, Covid) Nasopharyngeal Swab     Status: None   Collection Time: 10/24/20  8:12 PM   Specimen: Nasopharyngeal Swab; Nasopharyngeal(NP) swabs in vial transport medium  Result Value Ref Range Status   SARS Coronavirus 2 by RT PCR NEGATIVE NEGATIVE Final    Comment: (NOTE) SARS-CoV-2 target nucleic acids are NOT DETECTED.  The SARS-CoV-2 RNA is generally detectable in upper respiratory specimens during the acute phase of infection. The lowest concentration of SARS-CoV-2 viral copies this assay can detect is 138 copies/mL. A negative result does not preclude SARS-Cov-2 infection and should not be used as the sole basis for treatment or other patient management decisions. A negative result may occur with  improper specimen collection/handling, submission of specimen other than nasopharyngeal swab, presence of viral mutation(s) within the areas targeted by this assay, and inadequate number of  viral copies(<138 copies/mL). A negative result must  be combined with clinical observations, patient history, and epidemiological information. The expected result is Negative.  Fact Sheet for Patients:  EntrepreneurPulse.com.au  Fact Sheet for Healthcare Providers:  IncredibleEmployment.be  This test is no t yet approved or cleared by the [place] FDA and  has been authorized for detection and/or diagnosis of SARS-CoV-2 by FDA under an Emergency Use Authorization (EUA). This EUA will remain  in effect (meaning this test can be used) for the duration of the COVID-19 declaration under Section 564(b)(1) of the Act, 21 U.S.C.section 360bbb-3(b)(1), unless the authorization is terminated  or revoked sooner.       Influenza A by PCR NEGATIVE NEGATIVE Final   Influenza B by PCR NEGATIVE NEGATIVE Final    Comment: (NOTE) The Xpert Xpress SARS-CoV-2/FLU/RSV plus assay is intended as an aid in the diagnosis of influenza from Nasopharyngeal swab specimens and should not be used as a sole basis for treatment. Nasal washings and aspirates are unacceptable for Xpert Xpress SARS-CoV-2/FLU/RSV testing.  Fact Sheet for Patients: EntrepreneurPulse.com.au  Fact Sheet for Healthcare Providers: IncredibleEmployment.be  This test is not yet approved or cleared by the [place] FDA and has been authorized for detection and/or diagnosis of SARS-CoV-2 by FDA under an Emergency Use Authorization (EUA). This EUA will remain in effect (meaning this test can be used) for the duration of the COVID-19 declaration under Section 564(b)(1) of the Act, 21 U.S.C. section 360bbb-3(b)(1), unless the authorization is terminated or revoked.  Performed at [facility], [address] [place], [place] [address]   Culture, blood (routine x 2)     Status: None (Preliminary result)   Collection Time: 10/24/20 10:40 PM    Specimen: BLOOD  Result Value Ref Range Status   Specimen Description   Final    BLOOD LEFT ANTECUBITAL Performed at [facility], [address] [place], [place] [address]    Special Requests   Final    BOTTLES DRAWN AEROBIC AND ANAEROBIC Blood Culture adequate volume Performed at [facility], [address] [place], [place] [address]    Culture  Setup Time   Final    IN BOTH AEROBIC AND ANAEROBIC BOTTLES GRAM POSITIVE COCCI CRITICAL RESULT CALLED TO, READ BACK BY AND VERIFIED WITH: Avie Echevaria RN 10/26/20 0334 JDW Performed at [facility], [place] [address] [place], [place] [address]    Culture GRAM POSITIVE COCCI  Final   Report Status PENDING  Incomplete  Blood Culture ID Panel (Reflexed)     Status: Abnormal   Collection Time: 10/24/20 10:40 PM  Result Value Ref Range Status   Enterococcus faecalis NOT DETECTED NOT DETECTED Final   Enterococcus Faecium NOT DETECTED NOT DETECTED Final   Listeria monocytogenes NOT DETECTED NOT DETECTED Final   Staphylococcus species DETECTED (A) NOT DETECTED Final    Comment: CRITICAL RESULT CALLED TO, READ BACK BY AND VERIFIED WITH: L ADKINS RN 10/26/20 0334 JDW    Staphylococcus aureus (BCID) NOT DETECTED NOT DETECTED Final   Staphylococcus epidermidis NOT DETECTED NOT DETECTED Final   Staphylococcus lugdunensis NOT DETECTED NOT DETECTED Final   Streptococcus species NOT DETECTED NOT DETECTED Final   Streptococcus agalactiae NOT DETECTED NOT DETECTED Final   Streptococcus pneumoniae NOT DETECTED NOT DETECTED Final   Streptococcus pyogenes NOT DETECTED NOT DETECTED Final   A.calcoaceticus-baumannii NOT DETECTED NOT DETECTED Final   Bacteroides fragilis NOT DETECTED NOT DETECTED Final   Enterobacterales NOT DETECTED NOT DETECTED Final  Enterobacter cloacae complex NOT DETECTED NOT DETECTED Final   Escherichia coli NOT DETECTED NOT DETECTED Final   Klebsiella aerogenes NOT DETECTED NOT DETECTED Final   Klebsiella  oxytoca NOT DETECTED NOT DETECTED Final   Klebsiella pneumoniae NOT DETECTED NOT DETECTED Final   Proteus species NOT DETECTED NOT DETECTED Final   Salmonella species NOT DETECTED NOT DETECTED Final   Serratia marcescens NOT DETECTED NOT DETECTED Final   Haemophilus influenzae NOT DETECTED NOT DETECTED Final   Neisseria meningitidis NOT DETECTED NOT DETECTED Final   Pseudomonas aeruginosa NOT DETECTED NOT DETECTED Final   Stenotrophomonas maltophilia NOT DETECTED NOT DETECTED Final   Candida albicans NOT DETECTED NOT DETECTED Final   Candida auris NOT DETECTED NOT DETECTED Final   Candida glabrata NOT DETECTED NOT DETECTED Final   Candida krusei NOT DETECTED NOT DETECTED Final   Candida parapsilosis NOT DETECTED NOT DETECTED Final   Candida tropicalis NOT DETECTED NOT DETECTED Final   Cryptococcus neoformans/gattii NOT DETECTED NOT DETECTED Final    Comment: Performed at [facility], [place] [address] [place], [place] [address]  Culture, blood (routine x 2)     Status: None (Preliminary result)   Collection Time: 10/25/20 12:05 AM   Specimen: BLOOD LEFT HAND  Result Value Ref Range Status   Specimen Description   Final    BLOOD LEFT HAND Performed at [facility], [address] [place], [place] [address]    Special Requests   Final    BOTTLES DRAWN AEROBIC AND ANAEROBIC Blood Culture results may not be optimal due to an inadequate volume of blood received in culture bottles Performed at [facility], [address] [place], [place] [address]    Culture  Setup Time   Final    GRAM POSITIVE COCCI IN BOTH AEROBIC AND ANAEROBIC BOTTLES CRITICAL VALUE NOTED.  VALUE IS CONSISTENT WITH PREVIOUSLY REPORTED AND CALLED VALUE. Performed at [facility], [place] [address] [place], [place] [address]    Culture GRAM POSITIVE COCCI  Final   Report Status PENDING  Incomplete  Culture, blood (routine x 2)     Status: None (Preliminary result)   Collection Time:  10/26/20  6:18 PM   Specimen: BLOOD  Result Value Ref Range Status   Specimen Description   Final    BLOOD PICC LINE Performed at [facility], [place] [address], [place], [place] [address]    Special Requests   Final    BOTTLES DRAWN AEROBIC AND ANAEROBIC Blood Culture adequate volume Performed at [place] [address] [place], [place] [address]    Culture   Final    NO GROWTH < 12 HOURS Performed at [place] [address] [place], [place] [address]    Report Status PENDING  Incomplete  SARS CORONAVIRUS 2 (TAT 6-24 HRS) Nasopharyngeal Nasopharyngeal Swab     Status: None   Collection Time: 10/26/20  6:33 PM   Specimen: Nasopharyngeal Swab  Result Value Ref Range Status   SARS Coronavirus 2 NEGATIVE NEGATIVE Final    Comment: (NOTE) SARS-CoV-2 target nucleic acids are NOT DETECTED.  The SARS-CoV-2 RNA is generally detectable in upper and lower respiratory specimens during the acute phase of infection. Negative results do not preclude SARS-CoV-2 infection, do not rule out co-infections with other pathogens, and should not be used as the sole basis for treatment or other patient management decisions. Negative results must be combined with clinical  observations, patient history, and epidemiological information. The expected result is Negative.  Fact Sheet for Patients: SugarRoll.be  Fact Sheet for Healthcare Providers: https://www.woods-mathews.com/  This test is not yet approved or cleared by the [place] FDA and  has been authorized for detection and/or diagnosis of SARS-CoV-2 by FDA under an Emergency Use Authorization (EUA). This EUA will remain  in effect (meaning this test can be used) for the duration of the COVID-19 declaration under Se ction 564(b)(1) of the Act, 21 U.S.C. section 360bbb-3(b)(1), unless the authorization is terminated or revoked sooner.  Performed  at [facility], [place] [address] [place], [place] [address]     Alcide Evener, [facility] [phone] pager  10/27/2020, 11:13 AM

## 2020-10-27 NOTE — Progress Notes (Signed)
PROGRESS NOTE  Peter Hayes WRU:[id number] DOB: 30-Apr-1996 DOA: 10/26/2020 PCP: Penni Bombard, PA  Brief History   The patient is a 25 yr old man who carries a medical history of muscular dystrophy, chronic ileus (s/p ostomy), chronic BIPAP dependent respiratory failure. The patient has a PICC line through which he receives chronic TPN. He uses Trilegy at home with 2L Oxygen at all times.   The patient was diagnosed with staph aureus bacteremia on 08/24/2020. He completed treatment with IV Cefazolin for 4 weeks which concluded on 09/20/2020. The patient presented to [facility] ED on 1/17 2022 with complaints of fevers. There was concern as the patient's sister has recently been diagnosed with COVID. The patient's COVID was negative, but blood cultures x 2 were drawn. These came back positive on 10/26/2020. The patient's father who cares for him has been called to bring the patient back for admission.  [facility] have been consulted to admit the patient for further evaluation and treatment. The patient has been admitted to a medical bed. He is accompanied by his father. Infectious disease has been consulted. The patient has had an echocardiogram, and his PICC has been pulled.  Consultants  . Infectious disease  Procedures  . None  Antibiotics   Anti-infectives (From admission, onward)   Start     Dose/Rate Route Frequency Ordered Stop   10/26/20 2115  DAPTOmycin (CUBICIN) 280 mg in sodium chloride 0.9 % IVPB        8 mg/kg  35 kg 211.2 mL/hr over 30 Minutes Intravenous Daily 10/26/20 2012     10/26/20 1545  ceFAZolin (ANCEF) IVPB 1 g/50 mL premix        1 g 100 mL/hr over 30 Minutes Intravenous  Once 10/26/20 1533 10/26/20 1854    .   Subjective  The patient is resting comfortably with father at bedside. No new complaints.  Objective   Vitals:  Vitals:   10/27/20 0902 10/27/20 1300  BP: (!) 105/55 (!) 92/50  Pulse: 99 (!) 121  Resp: 16 16  Temp: 98 F (36.7 C) 98.1 F  (36.7 C)  SpO2: 99% 100%   Exam:  Constitutional:  The patient is resting quietly. He is not verbally responsive.  Respiratory:  . No increased work of breathing. . No wheezes, rales, or rhonchi . No tactile fremitus Cardiovascular:  . Regular rate and rhythm . No murmurs, ectopy, or gallups. . No lateral PMI. No thrills. Abdomen:  . Abdomen is soft, non-tender, non-distended . No hernias, masses, or organomegaly . Normoactive bowel sounds.  Musculoskeletal:  . No cyanosis, clubbing, or edema Skin:  . No rashes, lesions, ulcers . palpation of skin: no induration or nodules Neurologic:  . Unable to evaluate due to the patient's inability to cooperate with exam. Psychiatric:  . Unable to evaluate due to patient's inability to cooperate with exam.  I have personally reviewed the following:   Today's Data  . Vitals, CMP, CBC  Micro Data  . 2/4 blood cultures + for staph species.   Cardiology Data  . Echocardiogram  Scheduled Meds: . Chlorhexidine Gluconate Cloth  6 each Topical Daily  . magnesium oxide  400 mg Oral TID  . metoprolol tartrate  25 mg Oral Q8H  . multivitamin with minerals  1 tablet Oral Daily  . polyethylene glycol  17 g Oral Daily  . scopolamine  1 patch Transdermal Q72H   Continuous Infusions: . DAPTOmycin (CUBICIN)  IV Stopped (10/26/20 2310)  . dextrose 5 %  and 0.45 % NaCl with KCl 20 mEq/L 75 mL/hr at 10/27/20 1004    Principal Problem:   Coag negative Staphylococcus bacteremia Active Problems:   Muscular dystrophy ([place])   On total parenteral nutrition (TPN)   Bacteremia due to coagulase-negative Staphylococcus   LOS: 0 days   A & P  Staph bacteremia: Likely line sepsis. I have discussed the patient with ID. They recommend that the patient be placed on a TPN holiday for 2 days following removal of PICC. Blood cultures will be repeated x 2 1 day after PICC removed. TPN will be restarted 24 hours after that when new PICC may be replaced.  ID also recommends discontinuation of Ancef. Continue Daptomycin.  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  Chronic hypoxic respiratory failure: Treligy dependent at home. Continue on BIPAP here. Continue duonebs as at home.  Muscular dystrophy: Noted. Continue scopolamine.  Malnutrition: Continue nutritional supplements in absence of TPN.  I have seen and examined this patient myself. I have spent 36 minutes in his evaluation and care.  DVT Prophylaxis: lovenox CODE STATUS: Full Code Family Communication: Father at bedside Disposition: Status is: Inpatient  Remains inpatient appropriate because:Inpatient level of care appropriate due to severity of illness  Dispo: The patient is from: Home              Anticipated d/c is to: Home              Anticipated d/c date is: 2 days              Patient currently is not medically stable to d/c.  Abem Shaddix, DO [facility] 10/27/2020, 15:07      Ellamae Lybeck, DO [facility] Direct contact: see [email or web address]  7PM-7AM contact night coverage as above 10/27/2020, 2:35 PM  LOS: 0 days

## 2020-10-27 NOTE — Progress Notes (Signed)
Initial Nutrition Assessment  DOCUMENTATION CODES:   Underweight  INTERVENTION:  TPN management per Pharmacy   Ensure Enlive po TID, each supplement provides 350 kcal and 20 grams of protein  Magic cup TID with meals, each supplement provides 290 kcal and 9 grams of protein  6ml Prosource Plus po BID, each supplement provides 100 kcals and 15 grams of protein  NUTRITION DIAGNOSIS:   Inadequate oral intake related to chronic illness (chronic ileus) as evidenced by other (comment) (need for chronic TPN).  GOAL:   Patient will meet greater than or equal to 90% of their needs  MONITOR:   PO intake,Supplement acceptance,Skin,Weight trends,Labs,I & O's  REASON FOR ASSESSMENT:   Consult New TPN/TNA  ASSESSMENT:   Pt with PMH significant for MD, chronic ileus s/p ostomy, chronic respiratory failure on chronic BiPAP, and on chronic TPN through PICC line admitted with coag negative staphylococcus bacteremia (likely from PICC line).   Pt unavailable at time of RD visit. Pt noted to have po intake at home in addition to TPN, but unable to obtain diet history at this time to determine amount of calories taken in po.   Per Pharmacy, PICC line is to be replaced and TPN will be held until after the line holiday and blood culture clearance has been documented. PICC line was removed yesterday with plans to be replaced with TPN being reinitiated on 10/29/20.   Pt's outpatient TPN formula is as follows: Cyclic TPN cycled over 18 hours: 54g protein, 160g CHO, 24g ILE for a total of 999 kCal/day Lytes: K 65.5 mEq/day, Phos 17.8 mMol/day, Mg 12 mEq/day, no Ca, Cl:Ac 2:1  Reviewed weight history. No significant weight changes noted.   No UOP documented.   Labs: Mg 1.4 (L) Medications: Mg-ox, mvi with minerals, miralax  NUTRITION - FOCUSED PHYSICAL EXAM:  Unable to perform at this time. Will attempt at follow-up.   Diet Order:   Diet Order            Diet full liquid Room service  appropriate? Yes; Fluid consistency: Thin  Diet effective now                 EDUCATION NEEDS:   No education needs have been identified at this time  Skin:  Skin Assessment: Reviewed RN Assessment  Last BM:  PTA. Pt has colostomy.  Height:   Ht Readings from Last 1 Encounters:  10/27/20 $RemoveB'5\' 5"'hCNjlAKI$  (1.651 m)    Weight:   Wt Readings from Last 1 Encounters:  10/27/20 39.4 kg   BMI:  Body mass index is 14.45 kg/m.  Estimated Nutritional Needs:   Kcal:  1400-1600  Protein:  70-80 grams  Fluid:  >1.4L/d    Larkin Ina, MS, RD, LDN RD pager number and weekend/on-call pager number located in [place].

## 2020-10-27 NOTE — Progress Notes (Signed)
CRITICAL VALUE ALERT  Critical Value:  Glucose 32  Date & Time Notied:  10/27/2020 1257  Provider Notified:  Notified RN  Orders Received/Actions taken: Kasandra Knudsen, RN notified  Call received from Reita Chard RN notified.

## 2020-10-27 NOTE — Progress Notes (Signed)
Patient arrived in the unit at 0610 am with Bi-pap on from [facility], alert and oriented, no s/s distress, denied of pain, Vital signs are WNL except HR 113. Pt's HR has been high from previously, pt's father is on bed side, and will continue to monitor closely.

## 2020-10-27 NOTE — Consult Note (Addendum)
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 Kellie Simmering # [phone]) no barrier ring. Patient uses disposable pouches at home. Explained to father that the disposable pouches are not in our formulary. We will order the one piece flat while he is here. He said he has used those before. Education provided: None. Father has been taking care of this patient at home.  Enrolled patient in [place] program: No  Empty pouch when 1/3 to  full of urine/stool/flatus  Clean bottom 2-inches of fecal pouch prior to resealing  Assist patient in emptying pouch  Connect urinary pouch to bedside drain bag while in bed; disconnect patient from bedside drainage bag when OOB  Change pouch twice weekly and PRN. Write date of pouch application on pouch.  Have spare pouch at bedside at all times   Order Supplies: one piece flat pouch Kellie Simmering # [phone])  Father also states that the patient does not have any wounds. In the past he had a sacral wound which is healed now and only has a scar.   Cathlean Marseilles Tamala [place], MSN, RN, [place], Lysle Pearl, [facility] Wound Treatment Associate Pager [phone]

## 2020-10-27 NOTE — Progress Notes (Signed)
PHARMACY - TOTAL PARENTERAL NUTRITION CONSULT NOTE   Indication: Severe protein-calorie malnutrition  Patient Measurements: Height: 5\' 5"  (165.1 cm) Weight: 39.4 kg (86 lb 13.8 oz) IBW/kg (Calculated) : 61.5 TPN AdjBW (KG): 39.4 Body mass index is 14.45 kg/m. Usual Weight:   Assessment:  36 YOM with history of advanced muscular dystrophy with debility and severe protein calorie malnutrition on chronic TPN who was called back for admission after bacteremia with Staph species noted in 4/4 bottles on 1/17 after being evaluated in the ED for COVID exposure with fevers. Pharmacy consulted to manage TPN this admission.  Given the bacteremia likely related to the PICC line - this will need to be replaced and TPN should be held until after the line holiday and blood culture clearance has been documented. Confirmed with ID and TR[facility] MD  Outpatient TPN Formula Faxed over from [place] (in media section of chart) and is summarized as follows: Cyclic TPN cycled over 18 hours: 54g protein, 160g CHO, 24g ILE for a total of 999 kCal/day Lytes: K 65.5 mEq/day, Phos 17.8 mMol/day, Mg 12 mEq/day, no Ca, Cl:Ac 2:1  Plan - Hold TPN with bacteremia - Pharmacy will monitor peripherally for plans to resume when appropriate   Thank you for allowing pharmacy to be a part of this patient's care.  Alycia Rossetti, PharmD, BCPS Clinical Pharmacist Clinical phone for 10/27/2020: [phone] 10/27/2020 3:02 PM   **Pharmacist phone directory can now be found on [email or web address] ([email or web address]).  Listed under [place].

## 2020-10-27 NOTE — Progress Notes (Signed)
  Echocardiogram 2D Echocardiogram has been performed.  Peter Hayes 10/27/2020, 10:11 AM

## 2020-10-27 NOTE — ED Notes (Signed)
[facility] called for transport 

## 2020-10-28 DIAGNOSIS — B9561 Methicillin susceptible Staphylococcus aureus infection as the cause of diseases classified elsewhere: Secondary | ICD-10-CM | POA: Diagnosis not present

## 2020-10-28 DIAGNOSIS — R7881 Bacteremia: Secondary | ICD-10-CM | POA: Diagnosis not present

## 2020-10-28 DIAGNOSIS — G7100 Muscular dystrophy, unspecified: Secondary | ICD-10-CM | POA: Diagnosis not present

## 2020-10-28 LAB — CULTURE, BLOOD (ROUTINE X 2): Special Requests: ADEQUATE

## 2020-10-28 LAB — COMPREHENSIVE METABOLIC PANEL
ALT: 119 U/L — ABNORMAL HIGH (ref 0–44)
AST: 84 U/L — ABNORMAL HIGH (ref 15–41)
Albumin: 3.1 g/dL — ABNORMAL LOW (ref 3.5–5.0)
Alkaline Phosphatase: 127 U/L — ABNORMAL HIGH (ref 38–126)
Anion gap: 10 (ref 5–15)
BUN: 9 mg/dL (ref 6–20)
CO2: 23 mmol/L (ref 22–32)
Calcium: 8.3 mg/dL — ABNORMAL LOW (ref 8.9–10.3)
Chloride: 102 mmol/L (ref 98–111)
Creatinine, Ser: <0.3 mg/dL — ABNORMAL LOW (ref 0.61–1.24)
Glucose, Bld: 90 mg/dL (ref 70–99)
Potassium: 3.7 mmol/L (ref 3.5–5.1)
Sodium: 135 mmol/L (ref 135–145)
Total Bilirubin: 1.1 mg/dL (ref 0.3–1.2)
Total Protein: 6.3 g/dL — ABNORMAL LOW (ref 6.5–8.1)

## 2020-10-28 LAB — CBC WITH DIFFERENTIAL/PLATELET
Abs Immature Granulocytes: 0 10*3/uL (ref 0.00–0.07)
Basophils Absolute: 0 10*3/uL (ref 0.0–0.1)
Basophils Relative: 0 %
Eosinophils Absolute: 0 10*3/uL (ref 0.0–0.5)
Eosinophils Relative: 1 %
HCT: 30 % — ABNORMAL LOW (ref 39.0–52.0)
Hemoglobin: 9.9 g/dL — ABNORMAL LOW (ref 13.0–17.0)
Immature Granulocytes: 0 %
Lymphocytes Relative: 45 %
Lymphs Abs: 1.4 10*3/uL (ref 0.7–4.0)
MCH: 26.7 pg (ref 26.0–34.0)
MCHC: 33 g/dL (ref 30.0–36.0)
MCV: 80.9 fL (ref 80.0–100.0)
Monocytes Absolute: 0.6 10*3/uL (ref 0.1–1.0)
Monocytes Relative: 18 %
Neutro Abs: 1.1 10*3/uL — ABNORMAL LOW (ref 1.7–7.7)
Neutrophils Relative %: 36 %
Platelets: 90 10*3/uL — ABNORMAL LOW (ref 150–400)
RBC: 3.71 MIL/uL — ABNORMAL LOW (ref 4.22–5.81)
RDW: 15 % (ref 11.5–15.5)
WBC: 3.1 10*3/uL — ABNORMAL LOW (ref 4.0–10.5)
nRBC: 0 % (ref 0.0–0.2)

## 2020-10-28 LAB — MAGNESIUM: Magnesium: 1.4 mg/dL — ABNORMAL LOW (ref 1.7–2.4)

## 2020-10-28 LAB — GLUCOSE, CAPILLARY: Glucose-Capillary: 86 mg/dL (ref 70–99)

## 2020-10-28 LAB — PHOSPHORUS: Phosphorus: 3.8 mg/dL (ref 2.5–4.6)

## 2020-10-28 MED ORDER — PROSOURCE PLUS PO LIQD
30.0000 mL | Freq: Two times a day (BID) | ORAL | Status: DC
  Administered 2020-10-28 – 2020-10-29 (×3): 30 mL via ORAL
  Filled 2020-10-28 (×9): qty 30

## 2020-10-28 MED ORDER — CEFAZOLIN SODIUM-DEXTROSE 2-4 GM/100ML-% IV SOLN
2.0000 g | Freq: Three times a day (TID) | INTRAVENOUS | Status: DC
  Administered 2020-10-28 – 2020-11-01 (×12): 2 g via INTRAVENOUS
  Filled 2020-10-28 (×14): qty 100

## 2020-10-28 MED ORDER — ENSURE ENLIVE PO LIQD
237.0000 mL | Freq: Three times a day (TID) | ORAL | Status: DC
  Administered 2020-10-28: 237 mL via ORAL

## 2020-10-28 MED ORDER — MAGNESIUM SULFATE 4 GM/100ML IV SOLN
4.0000 g | Freq: Once | INTRAVENOUS | Status: AC
  Administered 2020-10-28: 4 g via INTRAVENOUS
  Filled 2020-10-28: qty 100

## 2020-10-28 MED ORDER — SCOPOLAMINE 1 MG/3DAYS TD PT72
2.0000 | MEDICATED_PATCH | TRANSDERMAL | Status: DC
  Administered 2020-10-30: 3 mg via TRANSDERMAL
  Filled 2020-10-28: qty 2

## 2020-10-28 MED ORDER — METOPROLOL TARTRATE 25 MG PO TABS
25.0000 mg | ORAL_TABLET | Freq: Two times a day (BID) | ORAL | Status: DC
  Administered 2020-10-28 – 2020-10-31 (×3): 25 mg via ORAL
  Filled 2020-10-28 (×6): qty 1

## 2020-10-28 MED ORDER — COVID-19 MRNA VACC (MODERNA) 100 MCG/0.5ML IM SUSP
0.5000 mL | Freq: Once | INTRAMUSCULAR | Status: AC
  Administered 2020-10-28: 0.5 mL via INTRAMUSCULAR
  Filled 2020-10-28: qty 0.5

## 2020-10-28 NOTE — Progress Notes (Signed)
PROGRESS NOTE  Peter Hayes LYY:[id number] DOB: July 16, 1996 DOA: 10/26/2020 PCP: Penni Bombard, PA  Brief History   The patient is a 25 yr old man who carries a medical history of muscular dystrophy, chronic ileus (s/p ostomy), chronic BIPAP dependent respiratory failure. The patient has a PICC line through which he receives chronic TPN. He uses Trilegy at home with 2L Oxygen at all times.   The patient was diagnosed with staph aureus bacteremia on 08/24/2020. He completed treatment with IV Cefazolin for 4 weeks which concluded on 09/20/2020. The patient presented to [facility] ED on 1/17 2022 with complaints of fevers. There was concern as the patient's sister has recently been diagnosed with COVID. The patient's COVID was negative, but blood cultures x 2 were drawn. These came back positive on 10/26/2020. The patient's father who cares for him has been called to bring the patient back for admission.  [facility] have been consulted to admit the patient for further evaluation and treatment. The patient has been admitted to a medical bed. He is accompanied by his father. Infectious disease has been consulted. The patient has had an echocardiogram, and his PICC has been pulled.  Repeat blood cultures were drawn on 10/27/2020. PICC line will be replaced if they remain without growth for 48 hours. The initial cultures have grown out Staph Caprae. This is methacillin sensitive. The patient's antibiotic has been narrowed to Ancef. If the cultures remain negative, the ancef will need to be continued for 7 days with today being day 1.  I appreciate Infectious disease's assistance.  Echocardiogram has been completed: It demonstrates LVEF of 60-65% with no wall motion abnormalities and normal function. RV systolic function is normal with normal RVSP. No vegetations are seen.  Consultants  . Infectious disease  Procedures  . PICC pulled on 10/27/2020.  Antibiotics   Anti-infectives (From admission,  onward)   Start     Dose/Rate Route Frequency Ordered Stop   10/28/20 2000  ceFAZolin (ANCEF) IVPB 2g/100 mL premix        2 g 200 mL/hr over 30 Minutes Intravenous Every 8 hours 10/28/20 0931 11/04/20 2359   10/26/20 2115  DAPTOmycin (CUBICIN) 280 mg in sodium chloride 0.9 % IVPB  Status:  Discontinued        8 mg/kg  35 kg 211.2 mL/hr over 30 Minutes Intravenous Daily 10/26/20 2012 10/28/20 0931   10/26/20 1545  ceFAZolin (ANCEF) IVPB 1 g/50 mL premix        1 g 100 mL/hr over 30 Minutes Intravenous  Once 10/26/20 1533 10/26/20 1854      Subjective  The patient is resting comfortably with father at bedside. The patient is more interactive than he was yesterday. The patient's father is concerned about the patient's upper airway secretions.   Objective   Vitals:  Vitals:   10/28/20 0721 10/28/20 1132  BP: (!) 88/53 (!) 104/59  Pulse: 65 93  Resp: 18 18  Temp: 98.2 F (36.8 C) 98.4 F (36.9 C)  SpO2: 100% 100%   Exam:  Constitutional:  The patient is resting quietly. He is not verbally responsive, but is expressive when the father communicates with him at bedside.  Respiratory:  . No increased work of breathing. . No wheezes, rales, or rhonchi . No tactile fremitus . Positive for copious upper airway secretions. Cardiovascular:  . Regular rate and rhythm, tachycardic . No murmurs, ectopy, or gallups. . No lateral PMI. No thrills. Abdomen:  . Abdomen is soft, non-tender, non-distended .  No hernias, masses, or organomegaly . Normoactive bowel sounds.  Musculoskeletal:  . No cyanosis, clubbing, or edema Skin:  . No rashes, lesions, ulcers . palpation of skin: no induration or nodules Neurologic:  . Unable to evaluate due to the patient's inability to cooperate with exam. Psychiatric:  . Unable to evaluate due to patient's inability to cooperate with exam.  I have personally reviewed the following:   Today's Data  . Vitals, CMP, CBC  Micro Data  . 2/4 blood  cultures + for staph caprae methicillin sensitive . Repeat blood cultures have had no growth after 24 hours.  Cardiology Data  . Echocardiogram  Scheduled Meds: . (feeding supplement) PROSource Plus  30 mL Oral BID BM  . Chlorhexidine Gluconate Cloth  6 each Topical Daily  . COVID-19 mRNA vaccine (Moderna)  0.5 mL Intramuscular ONCE-1600  . feeding supplement  237 mL Oral TID BM  . magnesium oxide  400 mg Oral TID  . metoprolol tartrate  25 mg Oral BID  . multivitamin with minerals  1 tablet Oral Daily  . polyethylene glycol  17 g Oral Daily  . [START ON 10/30/2020] scopolamine  2 patch Transdermal Q72H   Continuous Infusions: .  ceFAZolin (ANCEF) IV    . dextrose 5 % and 0.45 % NaCl with KCl 20 mEq/L 75 mL/hr at 10/27/20 2118    Principal Problem:   Coag negative Staphylococcus bacteremia Active Problems:   Muscular dystrophy ([place])   On total parenteral nutrition (TPN)   Bacteremia due to coagulase-negative Staphylococcus   Staphylococcus aureus bacteremia   LOS: 1 day   A & P  Staph bacteremia: Likely line sepsis. I have discussed the patient with ID. They recommend that the patient be placed on a TPN holiday for 2 days following removal of PICC. Blood cultures will be repeated x 2 1 day after PICC removed. TPN will be restarted 24 hours after that when new PICC may be replaced. ID also recommends discontinuation of Ancef. Continue Daptomycin.  Chronic ostomy: Wound/Ostomy Nurse has been consulted.  Chronic hypoxic respiratory failure: Treligy dependent at home. Continue on BIPAP here. Continue duonebs as at home.  Muscular dystrophy: Noted.  Tachycardia: Likely due to autonomic dysfunction. The patient was placed on lopressor 25 mg q8hr on 08/09/2021. The father does not recognize the physician's name, and does not know why. It was held this morning due to the patient's low blood pressure. I have reduced the frequency to bid and added parameters to hold it for SBP less than  100 and HR less than 100.  Copious upper airway secretions: This is despite scopalamine patch x 1. I have added a second. 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  Malnutrition: Continue nutritional supplements and IV fluids in absence of TPN.  I have seen and examined this patient myself. I have spent 38 minutes in his evaluation and care.  DVT Prophylaxis: lovenox CODE STATUS: Full Code Family Communication: Father at bedside Disposition: Status is: Inpatient  Remains inpatient appropriate because:Inpatient level of care appropriate due to severity of illness  Dispo: The patient is from: Home              Anticipated d/c is to: Home              Anticipated d/c date is: 2 days              Patient currently is not medically stable to d/c.  Asaph Serena, DO  [facility] 10/28/2020, 15:46      Jamilyn Pigeon, DO [facility] Direct contact: see [email or web address]  7PM-7AM contact night coverage as above 10/27/2020, 2:35 PM  LOS: 0 days

## 2020-10-28 NOTE — Progress Notes (Signed)
PHARMACY CONSULT NOTE FOR:  OUTPATIENT  PARENTERAL ANTIBIOTIC THERAPY (OPAT)  Indication: Bacteremia  Regimen: cefazolin 2g IV q8h  End date: 1/28/20022  IV antibiotic discharge orders are pended. To discharging provider:  please sign these orders via discharge navigator,  Select New Orders & click on the button choice - Manage This Unsigned Work.     Thank you for allowing pharmacy to be a part of this patient's care.  Phillis Haggis 10/28/2020, 10:57 AM

## 2020-10-28 NOTE — Progress Notes (Signed)
Pt active with [facility] that was also providing RN for IV therapies at home. The contact is Mr Peter Hayes: [phone].  [facility] was providing his TPN and will provide the IV abx at d/c.  Pt will need PICC prior to return home.  TOC following.

## 2020-10-28 NOTE — Progress Notes (Signed)
Subjective: No new complaints   Antibiotics:  Anti-infectives (From admission, onward)   Start     Dose/Rate Route Frequency Ordered Stop   10/28/20 2000  ceFAZolin (ANCEF) IVPB 2g/100 mL premix        2 g 200 mL/hr over 30 Minutes Intravenous Every 8 hours 10/28/20 0931     10/26/20 2115  DAPTOmycin (CUBICIN) 280 mg in sodium chloride 0.9 % IVPB  Status:  Discontinued        8 mg/kg  35 kg 211.2 mL/hr over 30 Minutes Intravenous Daily 10/26/20 2012 10/28/20 0931   10/26/20 1545  ceFAZolin (ANCEF) IVPB 1 g/50 mL premix        1 g 100 mL/hr over 30 Minutes Intravenous  Once 10/26/20 1533 10/26/20 1854      Medications: Scheduled Meds: . Chlorhexidine Gluconate Cloth  6 each Topical Daily  . magnesium oxide  400 mg Oral TID  . metoprolol tartrate  25 mg Oral Q8H  . multivitamin with minerals  1 tablet Oral Daily  . polyethylene glycol  17 g Oral Daily  . scopolamine  1 patch Transdermal Q72H   Continuous Infusions: .  ceFAZolin (ANCEF) IV    . dextrose 5 % and 0.45 % NaCl with KCl 20 mEq/L 75 mL/hr at 10/27/20 2118  . magnesium sulfate bolus IVPB 4 g (10/28/20 0906)   PRN Meds:.acetaminophen, feeding supplement, guaiFENesin, ipratropium-albuterol, ondansetron **OR** ondansetron (ZOFRAN) IV    Objective: Weight change:  No intake or output data in the 24 hours ending 10/28/20 1029 Blood pressure (!) 88/53, pulse 65, temperature 98.2 F (36.8 C), temperature source Axillary, resp. rate 18, height $RemoveBe'5\' 5"'WRLYQOYNB$  (1.651 m), weight 39.4 kg, SpO2 100 %. Temp:  [97.9 F (36.6 C)-98.6 F (37 C)] 98.2 F (36.8 C) (01/21 0721) Pulse Rate:  [65-121] 65 (01/21 0721) Resp:  [16-20] 18 (01/21 0721) BP: (88-109)/(45-61) 88/53 (01/21 0721) SpO2:  [99 %-100 %] 100 % (01/21 0721)  Physical Exam: Physical Exam HENT:     Nose: Nose normal.  Cardiovascular:     Rate and Rhythm: Normal rate and regular rhythm.  Pulmonary:     Comments: On BIPAP Musculoskeletal:         General: Deformity present.  Neurological:     Mental Status: He is alert. Mental status is at baseline.  Psychiatric:        Mood and Affect: Mood normal.        Behavior: Behavior normal.      CBC:    BMET Recent Labs    10/27/20 0825 10/28/20 0452  NA 139 135  K 3.9 3.7  CL 100 102  CO2 21* 23  GLUCOSE 32* 90  BUN 17 9  CREATININE <0.30* <0.30*  CALCIUM 9.4 8.3*     Liver Panel  Recent Labs    10/27/20 0825 10/28/20 0452  PROT 7.5 6.3*  ALBUMIN 3.8 3.1*  AST 191* 84*  ALT 154* 119*  ALKPHOS 152* 127*  BILITOT 2.1* 1.1       Sedimentation Rate No results for input(s): ESRSEDRATE in the last 72 hours. C-Reactive Protein No results for input(s): CRP in the last 72 hours.  Micro Results: Recent Results (from the past 720 hour(s))  Resp Panel by RT-PCR (Flu A&B, Covid) Nasopharyngeal Swab     Status: None   Collection Time: 10/24/20  8:12 PM   Specimen: Nasopharyngeal Swab; Nasopharyngeal(NP) swabs in vial transport medium  Result Value Ref Range  Status   SARS Coronavirus 2 by RT PCR NEGATIVE NEGATIVE Final    Comment: (NOTE) SARS-CoV-2 target nucleic acids are NOT DETECTED.  The SARS-CoV-2 RNA is generally detectable in upper respiratory specimens during the acute phase of infection. The lowest concentration of SARS-CoV-2 viral copies this assay can detect is 138 copies/mL. A negative result does not preclude SARS-Cov-2 infection and should not be used as the sole basis for treatment or other patient management decisions. A negative result may occur with  improper specimen collection/handling, submission of specimen other than nasopharyngeal swab, presence of viral mutation(s) within the areas targeted by this assay, and inadequate number of viral copies(<138 copies/mL). A negative result must be combined with clinical observations, patient history, and epidemiological information. The expected result is Negative.  Fact Sheet for Patients:   BloggerCourse.com  Fact Sheet for Healthcare Providers:  SeriousBroker.it  This test is no t yet approved or cleared by the [place] FDA and  has been authorized for detection and/or diagnosis of SARS-CoV-2 by FDA under an Emergency Use Authorization (EUA). This EUA will remain  in effect (meaning this test can be used) for the duration of the COVID-19 declaration under Section 564(b)(1) of the Act, 21 U.S.C.section 360bbb-3(b)(1), unless the authorization is terminated  or revoked sooner.       Influenza A by PCR NEGATIVE NEGATIVE Final   Influenza B by PCR NEGATIVE NEGATIVE Final    Comment: (NOTE) The Xpert Xpress SARS-CoV-2/FLU/RSV plus assay is intended as an aid in the diagnosis of influenza from Nasopharyngeal swab specimens and should not be used as a sole basis for treatment. Nasal washings and aspirates are unacceptable for Xpert Xpress SARS-CoV-2/FLU/RSV testing.  Fact Sheet for Patients: BloggerCourse.com  Fact Sheet for Healthcare Providers: SeriousBroker.it  This test is not yet approved or cleared by the [place] FDA and has been authorized for detection and/or diagnosis of SARS-CoV-2 by FDA under an Emergency Use Authorization (EUA). This EUA will remain in effect (meaning this test can be used) for the duration of the COVID-19 declaration under Section 564(b)(1) of the Act, 21 U.S.C. section 360bbb-3(b)(1), unless the authorization is terminated or revoked.  Performed at [facility], [address] [place], [place] [address]   Culture, blood (routine x 2)     Status: Abnormal   Collection Time: 10/24/20 10:40 PM   Specimen: BLOOD  Result Value Ref Range Status   Specimen Description   Final    BLOOD LEFT ANTECUBITAL Performed at [facility], [address] [place], [place] [address]    Special Requests   Final     BOTTLES DRAWN AEROBIC AND ANAEROBIC Blood Culture adequate volume Performed at [facility], [address] [place], [place] [address]    Culture  Setup Time   Final    IN BOTH AEROBIC AND ANAEROBIC BOTTLES GRAM POSITIVE COCCI CRITICAL RESULT CALLED TO, READ BACK BY AND VERIFIED WITH: Kermit Balo RN 10/26/20 0334 JDW Performed at [facility], [address] [place], [place] [address]    Culture STAPHYLOCOCCUS CAPRAE (A)  Final   Report Status 10/28/2020 FINAL  Final   Organism ID, Bacteria STAPHYLOCOCCUS CAPRAE  Final      Susceptibility   Staphylococcus caprae - MIC*    CIPROFLOXACIN <=0.5 SENSITIVE Sensitive     ERYTHROMYCIN <=0.25 SENSITIVE Sensitive     GENTAMICIN <=0.5 SENSITIVE Sensitive     OXACILLIN <=0.25 SENSITIVE Sensitive  TETRACYCLINE <=1 SENSITIVE Sensitive     VANCOMYCIN 1 SENSITIVE Sensitive     TRIMETH/SULFA <=10 SENSITIVE Sensitive     CLINDAMYCIN <=0.25 SENSITIVE Sensitive     RIFAMPIN <=0.5 SENSITIVE Sensitive     Inducible Clindamycin NEGATIVE Sensitive     * STAPHYLOCOCCUS CAPRAE  Blood Culture ID Panel (Reflexed)     Status: Abnormal   Collection Time: 10/24/20 10:40 PM  Result Value Ref Range Status   Enterococcus faecalis NOT DETECTED NOT DETECTED Final   Enterococcus Faecium NOT DETECTED NOT DETECTED Final   Listeria monocytogenes NOT DETECTED NOT DETECTED Final   Staphylococcus species DETECTED (A) NOT DETECTED Final    Comment: CRITICAL RESULT CALLED TO, READ BACK BY AND VERIFIED WITH: L ADKINS RN 10/26/20 0334 JDW    Staphylococcus aureus (BCID) NOT DETECTED NOT DETECTED Final   Staphylococcus epidermidis NOT DETECTED NOT DETECTED Final   Staphylococcus lugdunensis NOT DETECTED NOT DETECTED Final   Streptococcus species NOT DETECTED NOT DETECTED Final   Streptococcus agalactiae NOT DETECTED NOT DETECTED Final   Streptococcus pneumoniae NOT DETECTED NOT DETECTED Final   Streptococcus pyogenes NOT DETECTED NOT DETECTED Final    A.calcoaceticus-baumannii NOT DETECTED NOT DETECTED Final   Bacteroides fragilis NOT DETECTED NOT DETECTED Final   Enterobacterales NOT DETECTED NOT DETECTED Final   Enterobacter cloacae complex NOT DETECTED NOT DETECTED Final   Escherichia coli NOT DETECTED NOT DETECTED Final   Klebsiella aerogenes NOT DETECTED NOT DETECTED Final   Klebsiella oxytoca NOT DETECTED NOT DETECTED Final   Klebsiella pneumoniae NOT DETECTED NOT DETECTED Final   Proteus species NOT DETECTED NOT DETECTED Final   Salmonella species NOT DETECTED NOT DETECTED Final   Serratia marcescens NOT DETECTED NOT DETECTED Final   Haemophilus influenzae NOT DETECTED NOT DETECTED Final   Neisseria meningitidis NOT DETECTED NOT DETECTED Final   Pseudomonas aeruginosa NOT DETECTED NOT DETECTED Final   Stenotrophomonas maltophilia NOT DETECTED NOT DETECTED Final   Candida albicans NOT DETECTED NOT DETECTED Final   Candida auris NOT DETECTED NOT DETECTED Final   Candida glabrata NOT DETECTED NOT DETECTED Final   Candida krusei NOT DETECTED NOT DETECTED Final   Candida parapsilosis NOT DETECTED NOT DETECTED Final   Candida tropicalis NOT DETECTED NOT DETECTED Final   Cryptococcus neoformans/gattii NOT DETECTED NOT DETECTED Final    Comment: Performed at [facility], [address] [place], [place] [address]  Culture, blood (routine x 2)     Status: Abnormal   Collection Time: 10/25/20 12:05 AM   Specimen: BLOOD LEFT HAND  Result Value Ref Range Status   Specimen Description   Final    BLOOD LEFT HAND Performed at [facility], [address] [place], [place] [address]    Special Requests   Final    BOTTLES DRAWN AEROBIC AND ANAEROBIC Blood Culture results may not be optimal due to an inadequate volume of blood received in culture bottles Performed at [facility], [address] [place], [place] [address]    Culture  Setup Time   Final    GRAM POSITIVE COCCI IN BOTH AEROBIC AND ANAEROBIC  BOTTLES CRITICAL VALUE NOTED.  VALUE IS CONSISTENT WITH PREVIOUSLY REPORTED AND CALLED VALUE.    Culture (A)  Final    STAPHYLOCOCCUS CAPRAE SUSCEPTIBILITIES PERFORMED ON PREVIOUS CULTURE WITHIN THE LAST 5 DAYS. Performed at [facility], [place] [address], [place], [place] [address]    Report Status 10/28/2020 FINAL  Final  Culture,  blood (routine x 2)     Status: None (Preliminary result)   Collection Time: 10/26/20  6:18 PM   Specimen: BLOOD  Result Value Ref Range Status   Specimen Description   Final    BLOOD PICC LINE Performed at [facility], [place] [address] [place], [place] [address]    Special Requests   Final    BOTTLES DRAWN AEROBIC AND ANAEROBIC Blood Culture adequate volume Performed at [facility] [address] [place], [place] [address]    Culture  Setup Time   Final    GRAM POSITIVE COCCI IN CLUSTERS IN BOTH AEROBIC AND ANAEROBIC BOTTLES CRITICAL VALUE NOTED.  VALUE IS CONSISTENT WITH PREVIOUSLY REPORTED AND CALLED VALUE.    Culture   Final    GRAM POSITIVE COCCI IDENTIFICATION TO FOLLOW Performed at [facility], [place] [address] [place], [place] [address]    Report Status PENDING  Incomplete  SARS CORONAVIRUS 2 (TAT 6-24 HRS) Nasopharyngeal Nasopharyngeal Swab     Status: None   Collection Time: 10/26/20  6:33 PM   Specimen: Nasopharyngeal Swab  Result Value Ref Range Status   SARS Coronavirus 2 NEGATIVE NEGATIVE Final    Comment: (NOTE) SARS-CoV-2 target nucleic acids are NOT DETECTED.  The SARS-CoV-2 RNA is generally detectable in upper and lower respiratory specimens during the acute phase of infection. Negative results do not preclude SARS-CoV-2 infection, do not rule out co-infections with other pathogens, and should not be used as the sole basis for treatment or other patient management decisions. Negative results must be combined with clinical observations, patient history, and epidemiological  information. The expected result is Negative.  Fact Sheet for Patients: SugarRoll.be  Fact Sheet for Healthcare Providers: https://www.woods-mathews.com/  This test is not yet approved or cleared by the [place] FDA and  has been authorized for detection and/or diagnosis of SARS-CoV-2 by FDA under an Emergency Use Authorization (EUA). This EUA will remain  in effect (meaning this test can be used) for the duration of the COVID-19 declaration under Se ction 564(b)(1) of the Act, 21 U.S.C. section 360bbb-3(b)(1), unless the authorization is terminated or revoked sooner.  Performed at [facility], [place] [address], [place], [place] [address]     Studies/Results: DG Chest 2 View  Result Date: 10/26/2020 CLINICAL DATA:  Bacteremia EXAM: CHEST - 2 VIEW COMPARISON:  10/24/2020 FINDINGS: Rotated. No new consolidation or edema. No pleural effusion. No pneumothorax. Stable cardiomediastinal contours. Left PICC line is unchanged. IMPRESSION: No acute process in the chest. Electronically Signed   By: Macy Mis M.D.   On: 10/26/2020 15:25   ECHOCARDIOGRAM COMPLETE  Result Date: 10/27/2020    ECHOCARDIOGRAM REPORT   Patient Name:   Peter Hayes Date of Exam: 10/27/2020 Medical Rec #:  [id number]  Height:       65.0 in Accession #:    [id number] Weight:       86.9 lb Date of Birth:  1996-01-30  BSA:          1.388 m Patient Age:    24 years   BP:           102/51 mmHg Patient Gender: M          HR:           120 bpm. Exam Location:  Inpatient Procedure: 2D Echo, Cardiac Doppler and Color Doppler Indications:    Bactermia  History:        Patient has  prior history of Echocardiogram examinations, most                 recent 08/21/2020. Signs/Symptoms:Bacteremia and Fever; Risk                 Factors:Hypertension. Resp. failure, Bipap, pneumonia, sepsis.  Sonographer:    Lavenia Atlas Referring Phys: [id number] AVA SWAYZE  Sonographer Comments: Patient has  muscular dystrophy so images are off axis IMPRESSIONS  1. Left ventricular ejection fraction, by estimation, is 60 to 65%. The left ventricle has normal function. The left ventricle has no regional wall motion abnormalities. Left ventricular diastolic parameters were normal.  2. Right ventricular systolic function is normal. The right ventricular size is normal. There is normal pulmonary artery systolic pressure.  3. The mitral valve is grossly normal. No evidence of mitral valve regurgitation. No evidence of mitral stenosis.  4. The aortic valve is tricuspid. Aortic valve regurgitation is not visualized. No aortic stenosis is present. Comparison(s): No significant change from prior study. Conclusion(s)/Recommendation(s): No evidence of valvular vegetations on this transthoracic echocardiogram. Would recommend a transesophageal echocardiogram to exclude infective endocarditis if clinically indicated. FINDINGS  Left Ventricle: Left ventricular ejection fraction, by estimation, is 60 to 65%. The left ventricle has normal function. The left ventricle has no regional wall motion abnormalities. The left ventricular internal cavity size was normal in size. There is  no left ventricular hypertrophy. Left ventricular diastolic parameters were normal. Right Ventricle: The right ventricular size is normal. Right vetricular wall thickness was not well visualized. Right ventricular systolic function is normal. There is normal pulmonary artery systolic pressure. The tricuspid regurgitant velocity is 2.56 m/s, and with an assumed right atrial pressure of 3 mmHg, the estimated right ventricular systolic pressure is 29.2 mmHg. Left Atrium: Left atrial size was normal in size. Right Atrium: Right atrial size was normal in size. Pericardium: There is no evidence of pericardial effusion. Mitral Valve: The mitral valve is grossly normal. No evidence of mitral valve regurgitation. No evidence of mitral valve stenosis. Tricuspid Valve:  The tricuspid valve is normal in structure. Tricuspid valve regurgitation is trivial. No evidence of tricuspid stenosis. Aortic Valve: The aortic valve is tricuspid. Aortic valve regurgitation is not visualized. No aortic stenosis is present. Pulmonic Valve: The pulmonic valve was grossly normal. Pulmonic valve regurgitation is not visualized. No evidence of pulmonic stenosis. Aorta: The aortic root and ascending aorta are structurally normal, with no evidence of dilitation. IAS/Shunts: The atrial septum is grossly normal.  LEFT VENTRICLE PLAX 2D LVIDd:         4.40 cm  Diastology LVIDs:         2.80 cm  LV e' medial:    5.77 cm/s LV PW:         0.60 cm  LV E/e' medial:  15.6 LV IVS:        0.70 cm  LV e' lateral:   9.46 cm/s LVOT diam:     1.80 cm  LV E/e' lateral: 9.5 LV SV:         44 LV SV Index:   31 LVOT Area:     2.54 cm  RIGHT VENTRICLE RV Basal diam:  2.40 cm RV S prime:     8.92 cm/s TAPSE (M-mode): 1.8 cm LEFT ATRIUM             Index       RIGHT ATRIUM          Index LA diam:  3.30 cm 2.38 cm/m  RA Area:     5.56 cm LA Vol (A2C):   24.7 ml 17.80 ml/m RA Volume:   8.78 ml  6.33 ml/m LA Vol (A4C):   18.0 ml 12.97 ml/m LA Biplane Vol: 21.4 ml 15.42 ml/m  AORTIC VALVE LVOT Vmax:   102.00 cm/s LVOT Vmean:  69.700 cm/s LVOT VTI:    0.171 m  AORTA Ao Root diam: 2.40 cm MITRAL VALVE               TRICUSPID VALVE MV Area (PHT): 8.62 cm    TR Peak grad:   26.2 mmHg MV Decel Time: 88 msec     TR Vmax:        256.00 cm/s MV E velocity: 90.30 cm/s MV A velocity: 82.60 cm/s  SHUNTS MV E/A ratio:  1.09        Systemic VTI:  0.17 m                            Systemic Diam: 1.80 cm Buford Dresser MD Electronically signed by Buford Dresser MD Signature Date/Time: 10/27/2020/12:27:21 PM    Final       Assessment/Plan:  INTERVAL HISTORY: * coag neg staph is Methicillin S   Principal Problem:   Coag negative Staphylococcus bacteremia Active Problems:   Muscular dystrophy ([place])   On  total parenteral nutrition (TPN)   Bacteremia due to coagulase-negative Staphylococcus   Staphylococcus aureus bacteremia    Peter Hayes is a 25 y.o. 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  PICC line has been removed he was switched to daptomycin when the coagulase-negative staphylococcal species was identified on admission.  Organism turns out to be Staphylococcus CAPRAE Which turns out to be MS  Will switch to cefazolin  IF his blood cultures taken negative are no growth at 48 hours can then place PICC  Would then give him 7 day course of ancef with today being day #1   Peter Hayes has a video  appointment on 11/29/2020 at 415PM with Dr. Tommy Medal.   We will have home health draw surveillance blood cultures at that time.  The [facility] is located in the [facility] at  [place] in [place].  [address], which is located to the left of the elevators.  Phone: [phone]  Fax: [phone]  [email or web address]  I will followup blood cultures taken yesterday but otherwise will sign off  HE NEEDS COVID 19 VACCINE prior to DC and WANTS one  He had near [place] experience with sister only a few weeks ago and could have been given vaccine at [facility]. Active infection is NOT  Reason not to give mRNA vacccine    LOS: 1 day   Alcide Evener 10/28/2020, 10:29 AM

## 2020-10-29 ENCOUNTER — Inpatient Hospital Stay: Payer: Self-pay

## 2020-10-29 LAB — CBC WITH DIFFERENTIAL/PLATELET
Abs Immature Granulocytes: 0.01 10*3/uL (ref 0.00–0.07)
Basophils Absolute: 0 10*3/uL (ref 0.0–0.1)
Basophils Relative: 0 %
Eosinophils Absolute: 0 10*3/uL (ref 0.0–0.5)
Eosinophils Relative: 1 %
HCT: 29.9 % — ABNORMAL LOW (ref 39.0–52.0)
Hemoglobin: 9.5 g/dL — ABNORMAL LOW (ref 13.0–17.0)
Immature Granulocytes: 0 %
Lymphocytes Relative: 34 %
Lymphs Abs: 1.4 10*3/uL (ref 0.7–4.0)
MCH: 25.8 pg — ABNORMAL LOW (ref 26.0–34.0)
MCHC: 31.8 g/dL (ref 30.0–36.0)
MCV: 81.3 fL (ref 80.0–100.0)
Monocytes Absolute: 0.6 10*3/uL (ref 0.1–1.0)
Monocytes Relative: 15 %
Neutro Abs: 2 10*3/uL (ref 1.7–7.7)
Neutrophils Relative %: 50 %
Platelets: 94 10*3/uL — ABNORMAL LOW (ref 150–400)
RBC: 3.68 MIL/uL — ABNORMAL LOW (ref 4.22–5.81)
RDW: 14.9 % (ref 11.5–15.5)
WBC: 4 10*3/uL (ref 4.0–10.5)
nRBC: 0 % (ref 0.0–0.2)

## 2020-10-29 LAB — CULTURE, BLOOD (ROUTINE X 2): Special Requests: ADEQUATE

## 2020-10-29 LAB — COMPREHENSIVE METABOLIC PANEL
ALT: 92 U/L — ABNORMAL HIGH (ref 0–44)
AST: 57 U/L — ABNORMAL HIGH (ref 15–41)
Albumin: 2.9 g/dL — ABNORMAL LOW (ref 3.5–5.0)
Alkaline Phosphatase: 111 U/L (ref 38–126)
Anion gap: 9 (ref 5–15)
BUN: <5 mg/dL — ABNORMAL LOW (ref 6–20)
CO2: 24 mmol/L (ref 22–32)
Calcium: 8.1 mg/dL — ABNORMAL LOW (ref 8.9–10.3)
Chloride: 101 mmol/L (ref 98–111)
Creatinine, Ser: <0.3 mg/dL — ABNORMAL LOW (ref 0.61–1.24)
Glucose, Bld: 87 mg/dL (ref 70–99)
Potassium: 3.9 mmol/L (ref 3.5–5.1)
Sodium: 134 mmol/L — ABNORMAL LOW (ref 135–145)
Total Bilirubin: 1.2 mg/dL (ref 0.3–1.2)
Total Protein: 5.8 g/dL — ABNORMAL LOW (ref 6.5–8.1)

## 2020-10-29 LAB — GLUCOSE, CAPILLARY
Glucose-Capillary: 84 mg/dL (ref 70–99)
Glucose-Capillary: 103 mg/dL — ABNORMAL HIGH (ref 70–99)

## 2020-10-29 LAB — HEMOGLOBIN A1C
Hgb A1c MFr Bld: 4.6 % — ABNORMAL LOW (ref 4.8–5.6)
Mean Plasma Glucose: 85.32 mg/dL

## 2020-10-29 LAB — MAGNESIUM: Magnesium: 1.6 mg/dL — ABNORMAL LOW (ref 1.7–2.4)

## 2020-10-29 MED ORDER — MAGNESIUM SULFATE 4 GM/100ML IV SOLN
4.0000 g | Freq: Once | INTRAVENOUS | Status: AC
  Administered 2020-10-29: 4 g via INTRAVENOUS
  Filled 2020-10-29: qty 100

## 2020-10-29 NOTE — Progress Notes (Signed)
Spoke with RN re PICC order.  Plan on PICC placement 10/30/20. RN to obtain consent from father at bedside/legal guardian for PICC placement in am.

## 2020-10-29 NOTE — Progress Notes (Signed)
PHARMACY - TOTAL PARENTERAL NUTRITION CONSULT NOTE   Indication: Severe protein-calorie malnutrition  Patient Measurements: Height: 5\' 5"  (165.1 cm) Weight: 39.4 kg (86 lb 13.8 oz) IBW/kg (Calculated) : 61.5 TPN AdjBW (KG): 39.4 Body mass index is 14.45 kg/m. Usual Weight:   Assessment:  14 YOM with history of advanced muscular dystrophy with debility and severe protein calorie malnutrition on chronic TPN who was called back for admission after bacteremia with Staph species noted in 4/4 bottles on 1/17 after being evaluated in the ED for COVID exposure with fevers. Pharmacy consulted to manage TPN this admission.  Given the bacteremia likely related to the PICC line - this will need to be replaced and TPN should be held until after the line holiday and blood culture clearance has been documented. Confirmed with ID and TRH MD  Outpatient TPN Formula Faxed over from [facility] (in media section of chart) and is summarized as follows: Cyclic TPN cycled over 18 hours: 54g protein, 160g CHO, 24g ILE for a total of 999 kCal/day Lytes: K 65.5 mEq/day, Phos 17.8 mMol/day, Mg 12 mEq/day, no Ca, Cl:Ac 2:1  Plan - No guarantee PICC will be placed after 15:00 today when blood cultures would be ngtd x 48h so will hold off resuming TPN today and resume tomorrow. Discussed plan with IV team and Dr Benny Lennert who are in agreement.  Thank you for involving pharmacy in this patient's care.  Renold Genta, PharmD, BCPS Clinical Pharmacist Clinical phone for 10/29/2020 until 3p is [phone] 10/29/2020 11:06 AM  **Pharmacist phone directory can be found on [email or web address] listed under [place]**

## 2020-10-29 NOTE — Plan of Care (Signed)
Father at bedside. BP on the soft side. Lopressor not given.  Problem: Clinical Measurements: Goal: Ability to maintain clinical measurements within normal limits will improve Outcome: Progressing Goal: Will remain free from infection Outcome: Progressing Goal: Diagnostic test results will improve Outcome: Progressing Goal: Respiratory complications will improve Outcome: Progressing Goal: Cardiovascular complication will be avoided Outcome: Progressing   Problem: Activity: Goal: Risk for activity intolerance will decrease Outcome: Progressing   Problem: Nutrition: Goal: Adequate nutrition will be maintained Outcome: Progressing   Problem: Coping: Goal: Level of anxiety will decrease Outcome: Progressing   Problem: Elimination: Goal: Will not experience complications related to bowel motility Outcome: Progressing Goal: Will not experience complications related to urinary retention Outcome: Progressing   Problem: Pain Managment: Goal: General experience of comfort will improve Outcome: Progressing   Problem: Safety: Goal: Ability to remain free from injury will improve Outcome: Progressing   Problem: Skin Integrity: Goal: Risk for impaired skin integrity will decrease Outcome: Progressing

## 2020-10-29 NOTE — Progress Notes (Incomplete)
PHARMACY - TOTAL PARENTERAL NUTRITION CONSULT NOTE   Indication: severe protein-calorie malnutrition  Patient Measurements: Height: 5\' 5"  (165.1 cm) Weight: 39.4 kg (86 lb 13.8 oz) IBW/kg (Calculated) : 61.5 TPN AdjBW (KG): 39.4 Body mass index is 14.45 kg/m. Usual Weight:   Assessment: [name] with history of advanced muscular dystrophy with debility and severe protein calorie malnutrition on chronic TPN who was called back for admission after bacteremia with Staph species noted in 4/4 bottles on 1/17 after being evaluated in the ED for COVID exposure with fevers. Pharmacy consulted to manage TPN this admission.  Given the bacteremia likely related to the PICC line - this will need to be replaced and TPN should be held until after the line holiday and blood culture clearance has been documented. Confirmed with ID and TRH MD.  Glucose / Insulin:  Electrolytes:  Renal:  LFTs / TGs:  Prealbumin / albumin:  Intake / Output; MIVF:  GI Imaging: Surgeries / Procedures:   Central access: PICC to be placed by IV team TPN start date:   Nutritional Goals (per RD recommendation on ***): kCal: ***, Protein: ***, Fluid: *** Goal TPN rate is *** mL/hr (provides *** g of protein and *** kcals per day)  Current Nutrition:  {Current Nutrition:23378}  Plan:  Start TPN at ***mL/hr at 1800 Electrolytes in TPN: 64mEq/L of Na, 33mEq/L of K, 58mEq/L of Ca, 53mEq/L of Mg, and 68mmol/L of Phos. Cl:Ac ratio 1:1 Add standard MVI and trace elements to TPN Initiate {SSI - Scale:23379} {SSI - Frequency:23380} SSI and adjust as needed  Reduce MIVF to *** mL/hr at 1800 Monitor TPN labs on Mon/Thurs, ***  Thank you for involving pharmacy in this patient's care.  Renold Genta, PharmD, BCPS Clinical Pharmacist Clinical phone for 10/29/2020 until 3p is [phone] 10/29/2020 9:55 AM  **Pharmacist phone directory can be found on [email or web address] listed under [place]**

## 2020-10-29 NOTE — Progress Notes (Signed)
PROGRESS NOTE  Waymond Meador FXT:[id number] DOB: [birth year] DOA: 10/26/2020 PCP: Penni Bombard, PA  Brief History   The patient is a 25 yr old man who carries a medical history of muscular dystrophy, chronic ileus (s/p ostomy), chronic BIPAP dependent respiratory failure. The patient has a PICC line through which he receives chronic TPN. He uses Trilegy at home with 2L Oxygen at all times.   The patient was diagnosed with staph aureus bacteremia on 08/24/2020. He completed treatment with IV Cefazolin for 4 weeks which concluded on 09/20/2020. The patient presented to [facility] ED on 1/17 2022 with complaints of fevers. There was concern as the patient's sister has recently been diagnosed with COVID. The patient's COVID was negative, but blood cultures x 2 were drawn. These came back positive on 10/26/2020. The patient's father who cares for him has been called to bring the patient back for admission.  [facility] have been consulted to admit the patient for further evaluation and treatment. The patient has been admitted to a medical bed. He is accompanied by his father. Infectious disease has been consulted. The patient has had an echocardiogram, and his PICC has been pulled.  Repeat blood cultures were drawn on 10/27/2020. PICC line will be replaced if they remain without growth for 48 hours. The initial cultures have grown out Staph Caprae. This is methacillin sensitive. The patient's antibiotic has been narrowed to Ancef. If the cultures remain negative, the ancef will need to be continued for 7 days with today being day 1.  I appreciate Infectious disease's assistance.  Echocardiogram has been completed: It demonstrates LVEF of 60-65% with no wall motion abnormalities and normal function. RV systolic function is normal with normal RVSP. No vegetations are seen.  Awaiting results of the patient's blood cultures. If no growth, PICC will be ordered and TPN may resume tonight. Consultants   . Infectious disease  Procedures  . PICC pulled on 10/27/2020.  Antibiotics   Anti-infectives (From admission, onward)   Start     Dose/Rate Route Frequency Ordered Stop   10/28/20 2000  ceFAZolin (ANCEF) IVPB 2g/100 mL premix        2 g 200 mL/hr over 30 Minutes Intravenous Every 8 hours 10/28/20 0931 11/04/20 2359   10/26/20 2115  DAPTOmycin (CUBICIN) 280 mg in sodium chloride 0.9 % IVPB  Status:  Discontinued        8 mg/kg  35 kg 211.2 mL/hr over 30 Minutes Intravenous Daily 10/26/20 2012 10/28/20 0931   10/26/20 1545  ceFAZolin (ANCEF) IVPB 1 g/50 mL premix        1 g 100 mL/hr over 30 Minutes Intravenous  Once 10/26/20 1533 10/26/20 1854      Subjective  The patient is resting comfortably with father at bedside. No new complaints.  Objective   Vitals:  Vitals:   10/29/20 0750 10/29/20 1023  BP: 98/62 (!) 92/51  Pulse: 73 (!) 57  Resp: 20   Temp: (!) 97.5 F (36.4 C)   SpO2: 100%    Exam:  Constitutional:  The patient is resting quietly. He is not verbally responsive. Father is at bedside.  Respiratory:  . No increased work of breathing. . No wheezes, rales, or rhonchi . No tactile fremitus Cardiovascular:  . Regular rate and rhythm, tachycardic . No murmurs, ectopy, or gallups. . No lateral PMI. No thrills. Abdomen:  . Abdomen is soft, non-tender, non-distended . No hernias, masses, or organomegaly . Normoactive bowel sounds.  Musculoskeletal:  . No  cyanosis, clubbing, or edema Skin:  . No rashes, lesions, ulcers . palpation of skin: no induration or nodules Neurologic:  . Unable to evaluate due to the patient's inability to cooperate with exam. Psychiatric:  . Unable to evaluate due to patient's inability to cooperate with exam.  I have personally reviewed the following:   Today's Data  . Vitals, CMP, CBC  Micro Data  . 3/5 blood cultures + for staph caprae methicillin sensitive . Repeat blood cultures have had no growth after 24  hours.  Cardiology Data  . Echocardiogram  Scheduled Meds: . (feeding supplement) PROSource Plus  30 mL Oral BID BM  . Chlorhexidine Gluconate Cloth  6 each Topical Daily  . feeding supplement  237 mL Oral TID BM  . magnesium oxide  400 mg Oral TID  . metoprolol tartrate  25 mg Oral BID  . multivitamin with minerals  1 tablet Oral Daily  . polyethylene glycol  17 g Oral Daily  . [START ON 10/30/2020] scopolamine  2 patch Transdermal Q72H   Continuous Infusions: .  ceFAZolin (ANCEF) IV 2 g (10/29/20 0705)  . dextrose 5 % and 0.45 % NaCl with KCl 20 mEq/L 75 mL/hr at 10/27/20 2118    Principal Problem:   Coag negative Staphylococcus bacteremia Active Problems:   Muscular dystrophy ([place])   On total parenteral nutrition (TPN)   Bacteremia due to coagulase-negative Staphylococcus   Staphylococcus aureus bacteremia   LOS: 2 days   A & P  Staph bacteremia: Likely line sepsis. I have discussed the patient with ID. They recommend that the patient be placed on a TPN holiday for 2 days following removal of PICC. Blood cultures will be repeated x 2 1 day after PICC removed. TPN will be restarted 24 hours after that when new PICC may be replaced. ID also recommends discontinuation of Ancef. Continue Daptomycin.  Chronic ostomy: Wound/Ostomy Nurse has been consulted.  Chronic hypoxic respiratory failure: Treligy dependent at home. Continue on BIPAP here. Continue duonebs as at home.  Muscular dystrophy: Noted.  Tachycardia: Likely due to autonomic dysfunction. The patient was placed on lopressor 25 mg q8hr on 08/09/2021. The father does not recognize the physician's name, and does not know why. It was held this morning due to the patient's low blood pressure. I have reduced the frequency to bid and added parameters to hold it for SBP less than 100 and HR less than 100.  Copious upper airway secretions: This is despite scopalamine patch x 1. I have added a second. 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  Malnutrition: Continue nutritional supplements and IV fluids in absence of TPN.  I have seen and examined this patient myself. I have spent 32 minutes in his evaluation and care.  DVT Prophylaxis: lovenox CODE STATUS: Full Code Family Communication: Father at bedside Disposition: Status is: Inpatient  Remains inpatient appropriate because:Inpatient level of care appropriate due to severity of illness  Dispo: The patient is from: Home              Anticipated d/c is to: Home              Anticipated d/c date is: 2 days              Patient currently is not medically stable to d/c.  Lenard Kampf, DO [facility]  Gregery Walberg, DO [facility] Direct contact: see [email or web address]  7PM-7AM contact night coverage as above 10/29/2020, 12:35 PM  LOS: 0 days

## 2020-10-30 LAB — CBC WITH DIFFERENTIAL/PLATELET
Abs Immature Granulocytes: 0.01 10*3/uL (ref 0.00–0.07)
Basophils Absolute: 0 10*3/uL (ref 0.0–0.1)
Basophils Relative: 0 %
Eosinophils Absolute: 0.1 10*3/uL (ref 0.0–0.5)
Eosinophils Relative: 2 %
HCT: 29.8 % — ABNORMAL LOW (ref 39.0–52.0)
Hemoglobin: 9.8 g/dL — ABNORMAL LOW (ref 13.0–17.0)
Immature Granulocytes: 0 %
Lymphocytes Relative: 40 %
Lymphs Abs: 1.4 10*3/uL (ref 0.7–4.0)
MCH: 26.5 pg (ref 26.0–34.0)
MCHC: 32.9 g/dL (ref 30.0–36.0)
MCV: 80.5 fL (ref 80.0–100.0)
Monocytes Absolute: 0.5 10*3/uL (ref 0.1–1.0)
Monocytes Relative: 14 %
Neutro Abs: 1.5 10*3/uL — ABNORMAL LOW (ref 1.7–7.7)
Neutrophils Relative %: 44 %
Platelets: 110 10*3/uL — ABNORMAL LOW (ref 150–400)
RBC: 3.7 MIL/uL — ABNORMAL LOW (ref 4.22–5.81)
RDW: 14.9 % (ref 11.5–15.5)
WBC: 3.4 10*3/uL — ABNORMAL LOW (ref 4.0–10.5)
nRBC: 0 % (ref 0.0–0.2)

## 2020-10-30 LAB — COMPREHENSIVE METABOLIC PANEL
ALT: 60 U/L — ABNORMAL HIGH (ref 0–44)
AST: 33 U/L (ref 15–41)
Albumin: 2.9 g/dL — ABNORMAL LOW (ref 3.5–5.0)
Alkaline Phosphatase: 106 U/L (ref 38–126)
Anion gap: 8 (ref 5–15)
BUN: <5 mg/dL — ABNORMAL LOW (ref 6–20)
CO2: 27 mmol/L (ref 22–32)
Calcium: 8.2 mg/dL — ABNORMAL LOW (ref 8.9–10.3)
Chloride: 101 mmol/L (ref 98–111)
Creatinine, Ser: <0.3 mg/dL — ABNORMAL LOW (ref 0.61–1.24)
Glucose, Bld: 88 mg/dL (ref 70–99)
Potassium: 3.7 mmol/L (ref 3.5–5.1)
Sodium: 136 mmol/L (ref 135–145)
Total Bilirubin: 1.1 mg/dL (ref 0.3–1.2)
Total Protein: 5.9 g/dL — ABNORMAL LOW (ref 6.5–8.1)

## 2020-10-30 LAB — GLUCOSE, CAPILLARY
Glucose-Capillary: 80 mg/dL (ref 70–99)
Glucose-Capillary: 87 mg/dL (ref 70–99)
Glucose-Capillary: 87 mg/dL (ref 70–99)
Glucose-Capillary: 89 mg/dL (ref 70–99)
Glucose-Capillary: 94 mg/dL (ref 70–99)
Glucose-Capillary: 96 mg/dL (ref 70–99)

## 2020-10-30 LAB — MAGNESIUM: Magnesium: 2.1 mg/dL (ref 1.7–2.4)

## 2020-10-30 NOTE — Progress Notes (Signed)
Assessed pt for PICC placement.  Father states right arm cannot be used for PICC's due to too much scar tissue.  Left arm cephalic used last time is now noncompressible throughout.  Unable to abduct arm for brachial and basilic placement, pt winces in pain with slight movement to arm, will also be difficult to change any dressing on the interior aspect of arm.  Dorothy RN and Dr Benny Lennert notified.

## 2020-10-30 NOTE — Progress Notes (Addendum)
PROGRESS NOTE  Peter Hayes VQM:[id number] DOB: 09/05/1996 DOA: 10/26/2020 PCP: Penni Bombard, PA  Brief History   The patient is a 25 yr old man who carries a medical history of muscular dystrophy, chronic ileus (s/p ostomy), chronic BIPAP dependent respiratory failure. The patient has a PICC line through which he receives chronic TPN. He uses Trilegy at home with 2L Oxygen at all times.   The patient was diagnosed with staph aureus bacteremia on 08/24/2020. He completed treatment with IV Cefazolin for 4 weeks which concluded on 09/20/2020. The patient presented to [facility] ED on 1/17 2022 with complaints of fevers. There was concern as the patient's sister has recently been diagnosed with COVID. The patient's COVID was negative, but blood cultures x 2 were drawn. These came back positive on 10/26/2020. The patient's father who cares for him has been called to bring the patient back for admission.  [facility] have been consulted to admit the patient for further evaluation and treatment. The patient has been admitted to a medical bed. He is accompanied by his father. Infectious disease has been consulted. The patient has had an echocardiogram, and his PICC has been pulled.  Repeat blood cultures were drawn on 10/27/2020. PICC line will be replaced if they remain without growth for 48 hours. The initial cultures have grown out Staph Caprae. This is methacillin sensitive. The patient's antibiotic has been narrowed to Ancef. If the cultures remain negative, the ancef will need to be continued for 7 days with today being day 1.  I appreciate Infectious disease's assistance.  Echocardiogram has been completed: It demonstrates LVEF of 60-65% with no wall motion abnormalities and normal function. RV systolic function is normal with normal RVSP. No vegetations are seen.  Awaiting results of the patient's blood cultures. The patient's blood cultures have had no growth. PICC team could not place PICC. IR  has been consulted to place PICC. Consultants  Infectious disease  Procedures  PICC pulled on 10/27/2020.  Antibiotics   Anti-infectives (From admission, onward)    Start     Dose/Rate Route Frequency Ordered Stop   10/28/20 2000  ceFAZolin (ANCEF) IVPB 2g/100 mL premix        2 g 200 mL/hr over 30 Minutes Intravenous Every 8 hours 10/28/20 0931 11/04/20 2359   10/26/20 2115  DAPTOmycin (CUBICIN) 280 mg in sodium chloride 0.9 % IVPB  Status:  Discontinued        8 mg/kg  35 kg 211.2 mL/hr over 30 Minutes Intravenous Daily 10/26/20 2012 10/28/20 0931   10/26/20 1545  ceFAZolin (ANCEF) IVPB 1 g/50 mL premix        1 g 100 mL/hr over 30 Minutes Intravenous  Once 10/26/20 1533 10/26/20 1854       Subjective  The patient is resting comfortably with father at bedside. No new complaints.  Objective   Vitals:  Vitals:   10/30/20 0825 10/30/20 1152  BP: 95/60 92/62  Pulse: 62 73  Resp: (!) 21 20  Temp:  98 F (36.7 C)  SpO2: 100%    Exam:  Constitutional:  The patient is resting quietly. He is not verbally responsive. Father is at bedside.  Respiratory:  No increased work of breathing. No wheezes, rales, or rhonchi No tactile fremitus Cardiovascular:  Regular rate and rhythm, tachycardic No murmurs, ectopy, or gallups. No lateral PMI. No thrills. Abdomen:  Abdomen is soft, non-tender, non-distended No hernias, masses, or organomegaly Normoactive bowel sounds.  Musculoskeletal:  No cyanosis, clubbing, or  edema Skin:  No rashes, lesions, ulcers palpation of skin: no induration or nodules Neurologic:  Unable to evaluate due to the patient's inability to cooperate with exam. Psychiatric:  Unable to evaluate due to patient's inability to cooperate with exam.  I have personally reviewed the following:   Today's Data  Vitals, CMP, CBC  Micro Data  3/5 blood cultures + for staph caprae methicillin sensitive Repeat blood cultures have had no growth after 24  hours.  Cardiology Data  Echocardiogram  Scheduled Meds:  (feeding supplement) PROSource Plus  30 mL Oral BID BM   Chlorhexidine Gluconate Cloth  6 each Topical Daily   feeding supplement  237 mL Oral TID BM   magnesium oxide  400 mg Oral TID   metoprolol tartrate  25 mg Oral BID   multivitamin with minerals  1 tablet Oral Daily   polyethylene glycol  17 g Oral Daily   scopolamine  2 patch Transdermal Q72H   Continuous Infusions:   ceFAZolin (ANCEF) IV 2 g (10/30/20 0618)   dextrose 5 % and 0.45 % NaCl with KCl 20 mEq/L 75 mL/hr at 10/29/20 2141    Principal Problem:   Coag negative Staphylococcus bacteremia Active Problems:   Muscular dystrophy ([place])   On total parenteral nutrition (TPN)   Bacteremia due to coagulase-negative Staphylococcus   Staphylococcus aureus bacteremia   LOS: 3 days   A & P  Staph bacteremia: Likely line sepsis. I have discussed the patient with ID. They recommend that the patient be placed on a TPN holiday for 2 days following removal of PICC. Repeat blood cultures have had no growth x 3 days now. IR has been consulted to place PICC. TPN will be restarted 24 hours after that when new PICC may be replaced. ID also recommends discontinuation of Ancef. Continue Daptomycin.  Chronic ostomy: Wound/Ostomy Nurse has been consulted.  Chronic hypoxic respiratory failure: Treligy dependent at home. Continue on BIPAP here. Continue duonebs as at home.  Muscular dystrophy: Noted.  Tachycardia: Likely due to autonomic dysfunction. The patient was placed on lopressor 25 mg q8hr on 08/09/2021. The father does not recognize the physician's name, and does not know why. It was held this morning due to the patient's low blood pressure. I have reduced the frequency to bid and added parameters to hold it for SBP less than 100 and HR less than 100.  Copious upper airway secretions: This is despite scopalamine patch x 1. I have added a second. 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  Severe Protein Calorie Malnutrition: - Patient had been on home TPN via PICC line -History muscular dystrophy -Body mass index is 14.45 kg/m. Continue nutritional supplements and IV fluids in absence of TPN.  I have seen and examined this patient myself. I have spent 32 minutes in his evaluation and care.  DVT Prophylaxis: lovenox CODE STATUS: Full Code Family Communication: Father at bedside Disposition: Status is: Inpatient  Remains inpatient appropriate because:Inpatient level of care appropriate due to severity of illness  Dispo: The patient is from: Home              Anticipated d/c is to: Home              Anticipated d/c date is: 2 days              Patient currently is not medically stable to d/c.  Lilah Mijangos, DO [facility]  Tkeya Stencil, DO [facility] Direct  contact: see [email or web address]  7PM-7AM contact night coverage as above 10/30/2020, 12:43 PM  LOS: 0 days

## 2020-10-30 NOTE — Progress Notes (Signed)
PHARMACY - TOTAL PARENTERAL NUTRITION CONSULT NOTE   Indication: severe protein-calorie malnutrition  Patient Measurements: Height: 5\' 5"  (165.1 cm) Weight: 39.4 kg (86 lb 13.8 oz) IBW/kg (Calculated) : 61.5 TPN AdjBW (KG): 39.4 Body mass index is 14.45 kg/m. Usual Weight:   Assessment: 59 YOM with history of advanced muscular dystrophy with debility and severe protein calorie malnutrition on chronic TPN who was called back for admission after bacteremia with Staph species noted in 4/4 bottles on 1/17 after being evaluated in the ED for COVID exposure with fevers. Pharmacy consulted to manage TPN this admission.  Last home TPN was 1/18; TPN was held 1/19 >> 1/22 for line holiday.  1/23: IV team unable to place PICC today  Outpatient TPN Formula Faxed over from [place] (in media section of chart) and is summarized as follows: Cyclic TPN cycled over 18 hours: 54g protein, 160g CHO, 24g ILE for a total of 999 kCal/day Lytes: K 65.5 mEq/day, Phos 17.8 mMol/day, Mg 12 mEq/day, no Ca, Cl:Ac 2:1  Glucose / Insulin: no hx DM, CBGs controlled off TPN Electrolytes: Mg 1.6 >> 2.1 s/p 4 g x1 + Mag Ox 400 mg TID Renal: SCr <0.3 LFTs / TGs: mild ALT elevation, Tbili WNL, TG WNL Prealbumin / albumin: albumin 2.9, prealbumin 11 Intake / Output; MIVF: UOP documentation inaccurate, D5-45NS + K20 at 75 ml/hr, LBM 1/22 GI Imaging: Surgeries / Procedures:   Central access: PICC to be placed by IV team TPN start date: TBD  Nutritional Goals (per RD recommendation on 1/21): kCal: 1400-1600, Protein: 70-80, Fluid: > 1.4L Goal TPN: 1440 ml cycled over 18 hrs (provides 79 g of protein and average 1560 kcals per day)  Current Nutrition:  Full liquids and TPN - Dad reports he is consuming 25-50% soup at mealtime Ensure Enlive TID (each provides 350 kcal and 20 g protein) - 3 refused Magic cup TID (each provides 290 kcal and 9 g protein) Prosource Plus BID (each provides 100 kcals and 15 g protein) - 2  given  Plan:  Resume TPN once PICC able to be placed  Consider revisiting discussion of G-tube F/U in am   Thank you for involving pharmacy in this patient's care.  Renold Genta, PharmD, BCPS Clinical Pharmacist Clinical phone for 10/30/2020 until 3p is [phone] 10/30/2020 7:18 AM  **Pharmacist phone directory can be found on [email or web address] listed under [place]**

## 2020-10-31 ENCOUNTER — Inpatient Hospital Stay: Payer: Medicaid Other | Admitting: Internal Medicine

## 2020-10-31 ENCOUNTER — Inpatient Hospital Stay (HOSPITAL_COMMUNITY): Payer: Managed Care, Other (non HMO)

## 2020-10-31 LAB — COMPREHENSIVE METABOLIC PANEL
ALT: 40 U/L (ref 0–44)
AST: 28 U/L (ref 15–41)
Albumin: 2.9 g/dL — ABNORMAL LOW (ref 3.5–5.0)
Alkaline Phosphatase: 97 U/L (ref 38–126)
Anion gap: 7 (ref 5–15)
BUN: <5 mg/dL — ABNORMAL LOW (ref 6–20)
CO2: 27 mmol/L (ref 22–32)
Calcium: 8.6 mg/dL — ABNORMAL LOW (ref 8.9–10.3)
Chloride: 104 mmol/L (ref 98–111)
Creatinine, Ser: <0.3 mg/dL — ABNORMAL LOW (ref 0.61–1.24)
Glucose, Bld: 94 mg/dL (ref 70–99)
Potassium: 3.9 mmol/L (ref 3.5–5.1)
Sodium: 138 mmol/L (ref 135–145)
Total Bilirubin: 1 mg/dL (ref 0.3–1.2)
Total Protein: 6.1 g/dL — ABNORMAL LOW (ref 6.5–8.1)

## 2020-10-31 LAB — DIFFERENTIAL
Abs Immature Granulocytes: 0.01 10*3/uL (ref 0.00–0.07)
Basophils Absolute: 0 10*3/uL (ref 0.0–0.1)
Basophils Relative: 0 %
Eosinophils Absolute: 0.1 10*3/uL (ref 0.0–0.5)
Eosinophils Relative: 1 %
Immature Granulocytes: 0 %
Lymphocytes Relative: 44 %
Lymphs Abs: 1.9 10*3/uL (ref 0.7–4.0)
Monocytes Absolute: 0.6 10*3/uL (ref 0.1–1.0)
Monocytes Relative: 13 %
Neutro Abs: 1.9 10*3/uL (ref 1.7–7.7)
Neutrophils Relative %: 42 %

## 2020-10-31 LAB — TRIGLYCERIDES: Triglycerides: 61 mg/dL (ref <150)

## 2020-10-31 LAB — PHOSPHORUS: Phosphorus: 3.1 mg/dL (ref 2.5–4.6)

## 2020-10-31 LAB — CBC
HCT: 32.8 % — ABNORMAL LOW (ref 39.0–52.0)
Hemoglobin: 10.5 g/dL — ABNORMAL LOW (ref 13.0–17.0)
MCH: 25.9 pg — ABNORMAL LOW (ref 26.0–34.0)
MCHC: 32 g/dL (ref 30.0–36.0)
MCV: 81 fL (ref 80.0–100.0)
Platelets: 138 10*3/uL — ABNORMAL LOW (ref 150–400)
RBC: 4.05 MIL/uL — ABNORMAL LOW (ref 4.22–5.81)
RDW: 14.9 % (ref 11.5–15.5)
WBC: 4.4 10*3/uL (ref 4.0–10.5)
nRBC: 0 % (ref 0.0–0.2)

## 2020-10-31 LAB — MAGNESIUM: Magnesium: 1.7 mg/dL (ref 1.7–2.4)

## 2020-10-31 LAB — GLUCOSE, CAPILLARY
Glucose-Capillary: 80 mg/dL (ref 70–99)
Glucose-Capillary: 76 mg/dL (ref 70–99)
Glucose-Capillary: 75 mg/dL (ref 70–99)
Glucose-Capillary: 95 mg/dL (ref 70–99)

## 2020-10-31 LAB — PREALBUMIN: Prealbumin: 8.7 mg/dL — ABNORMAL LOW (ref 18–38)

## 2020-10-31 MED ORDER — MAGNESIUM SULFATE 2 GM/50ML IV SOLN
2.0000 g | Freq: Once | INTRAVENOUS | Status: AC
  Administered 2020-10-31: 2 g via INTRAVENOUS
  Filled 2020-10-31: qty 50

## 2020-10-31 MED ORDER — STERILE WATER FOR INJECTION IV SOLN
INTRAVENOUS | Status: DC
  Filled 2020-10-31: qty 264

## 2020-10-31 MED ORDER — LIDOCAINE HCL 1 % IJ SOLN
INTRAMUSCULAR | Status: AC
  Filled 2020-10-31: qty 20

## 2020-10-31 MED ORDER — CEFAZOLIN IV (FOR PTA / DISCHARGE USE ONLY): 2.0000 g | Freq: Three times a day (TID) | INTRAVENOUS | 0 refills | Status: AC

## 2020-10-31 MED ORDER — INFLUENZA VAC SPLIT QUAD 0.5 ML IM SUSY
0.5000 mL | PREFILLED_SYRINGE | INTRAMUSCULAR | Status: AC
  Administered 2020-11-01: 0.5 mL via INTRAMUSCULAR
  Filled 2020-10-31: qty 0.5

## 2020-10-31 MED ORDER — POTASSIUM CHLORIDE 2 MEQ/ML IV SOLN
INTRAVENOUS | Status: DC
  Filled 2020-10-31 (×4): qty 1000

## 2020-10-31 MED ORDER — COVID-19 MRNA VAC-TRIS(PFIZER) 30 MCG/0.3ML IM SUSP
0.3000 mL | Freq: Once | INTRAMUSCULAR | Status: DC
  Filled 2020-10-31: qty 0.3

## 2020-10-31 NOTE — Progress Notes (Signed)
PHARMACY - TOTAL PARENTERAL NUTRITION CONSULT NOTE  Indication: severe protein-calorie malnutrition  Patient Measurements: Height: 5\' 5"  (165.1 cm) Weight: 39.4 kg (86 lb 13.8 oz) IBW/kg (Calculated) : 61.5 TPN AdjBW (KG): 39.4 Body mass index is 14.45 kg/m. Usual Weight:   Assessment:  89 YOM with history of advanced muscular dystrophy with debility and severe PCM on chronic TPN.  Patient admitted with Staph species noted in 4/4 bottles on 1/17 after being evaluated in the ED for COVID exposure with fevers. Pharmacy consulted to manage TPN.  Last home TPN was 1/18; TPN was held 1/19 >> 1/22 for line holiday.  Unable to placed PICC 10/31/19.  Glucose / Insulin: no hx DM - CBGs low normal off of TPN Electrolytes: all WNL (Mag low normal at 1.7on scheduled MagOx 400mg  PO TID) Renal: SCr <0.3 LFTs / TGs: LFT / tbili / TG WNL Prealbumin / albumin: albumin 2.9, prealbumin down to 8.7 from being off TPN since 1/18 Intake / Output; MIVF: UOP documentation inaccurate, D545NS 20K at 75 ml/hr, LBM 1/22 GI Imaging: none this admit Surgeries / Procedures: none this admit  Central access: PICC to be placed by IR TPN start date: TBD  Nutritional Goals (per RD rec on 1/21): kCal: 1400-1600, Protein: 70-80, Fluid: > 1.4L Goal TPN: 1440 ml cycled over 18 hrs (provides 79g AA and average 1560 kCal per day) - AA 55g/L, CHO 21%, 35g/L ILE on MWF  Outpatient TPN Formula faxed over from [place] (in media section of chart): Cyclic TPN cycled 1L over 18 hrs: 54g protein, 160g CHO, 24g ILE for a total of 999 kCal/day Lytes: K 65.5 mEq/day, Phos 17.8 mMol/day, Mg 12 mEq/day, no Ca, Cl:Ac 2:1  Current Nutrition:  TPN Full liquid diet - consume up to 15% of meals Ensure Enlive TID (each provides 350 kcal and 20 g protein) - none charted given Magic cup TID (each provides 290 kcal and 9 g protein) Prosource Plus BID (each provides 100 kcals and 15 g protein) - none charted given  Plan:  Confirmed  with IR staff that PICC will be placed today Restart TPN at 30 ml/hr at 1800 (goal rate 60 ml/hr) Electrolytes in TPN: Na 12mEq/L, K 14mEq/L, no Ca as pta, Mag 50mEq/L, Phos 82mmol/L, Cl:Ac 1:1  No multivitamin and trace element in TPN - on PO multivitamin Monitor CBGs - initiate SSI if needed Mag sulfate 2gm IV x 1 Reduce IVF to 45 ml/hr when TPN starts F/U AM labs Cycle TPN when stable on goal rate  Kennard Fildes D. Mina Marble, PharmD, BCPS, [place] 10/31/2020, 9:18 AM

## 2020-10-31 NOTE — Progress Notes (Signed)
PROGRESS NOTE  Peter Hayes IRJ:[id number] DOB: [birth year] DOA: 10/26/2020 PCP: Penni Bombard, PA  Brief History   The patient is a 25 yr old man who carries a medical history of muscular dystrophy, chronic ileus (s/p ostomy), chronic BIPAP dependent respiratory failure. The patient has a PICC line through which he receives chronic TPN. He uses Trilegy at home with 2L Oxygen at all times.   The patient was diagnosed with staph aureus bacteremia on 08/24/2020. He completed treatment with IV Cefazolin for 4 weeks which concluded on 09/20/2020. The patient presented to [facility] ED on 1/17 2022 with complaints of fevers. There was concern as the patient's sister has recently been diagnosed with COVID. The patient's COVID was negative, but blood cultures x 2 were drawn. These came back positive on 10/26/2020. The patient's father who cares for him has been called to bring the patient back for admission.  [facility] have been consulted to admit the patient for further evaluation and treatment. The patient has been admitted to a medical bed. He is accompanied by his father. Infectious disease has been consulted. The patient has had an echocardiogram, and his PICC has been pulled.  Repeat blood cultures were drawn on 10/27/2020. PICC line will be replaced if they remain without growth for 48 hours. The initial cultures have grown out Staph Caprae. This is methacillin sensitive. The patient's antibiotic has been narrowed to Ancef. If the cultures remain negative, the ancef will need to be continued for 7 days with today being day 1.  I appreciate Infectious disease's assistance.  Echocardiogram has been completed: It demonstrates LVEF of 60-65% with no wall motion abnormalities and normal function. RV systolic function is normal with normal RVSP. No vegetations are seen.  Awaiting results of the patient's blood cultures. The patient's blood cultures have had no growth. PICC team could not place PICC. IR  has been consulted to place PICC. Unfortunately that will not occur until later this afternoon.  Consultants  . Infectious disease . Interventional radiology  Procedures  . PICC pulled on 10/27/2020.  Antibiotics   Anti-infectives (From admission, onward)   Start     Dose/Rate Route Frequency Ordered Stop   10/31/20 0000  ceFAZolin (ANCEF) IVPB        2 g Intravenous Every 8 hours 10/31/20 1122 11/07/20 2359   10/28/20 2000  ceFAZolin (ANCEF) IVPB 2g/100 mL premix        2 g 200 mL/hr over 30 Minutes Intravenous Every 8 hours 10/28/20 0931 11/04/20 2359   10/26/20 2115  DAPTOmycin (CUBICIN) 280 mg in sodium chloride 0.9 % IVPB  Status:  Discontinued        8 mg/kg  35 kg 211.2 mL/hr over 30 Minutes Intravenous Daily 10/26/20 2012 10/28/20 0931   10/26/20 1545  ceFAZolin (ANCEF) IVPB 1 g/50 mL premix        1 g 100 mL/hr over 30 Minutes Intravenous  Once 10/26/20 1533 10/26/20 1854      Subjective  The patient is resting comfortably with father at bedside. No new complaints.  Objective   Vitals:  Vitals:   10/31/20 0920 10/31/20 1616  BP: 99/63 121/70  Pulse: 66 (!) 101  Resp: 16 15  Temp: 97.6 F (36.4 C) 97.6 F (36.4 C)  SpO2: 100% 100%   Exam:  Constitutional:  The patient is resting quietly. He is not verbally responsive. Father is at bedside.  Respiratory:  . No increased work of breathing. . No wheezes, rales, or  rhonchi . No tactile fremitus Cardiovascular:  . Regular rate and rhythm, tachycardic . No murmurs, ectopy, or gallups. . No lateral PMI. No thrills. Abdomen:  . Abdomen is soft, non-tender, non-distended . No hernias, masses, or organomegaly . Normoactive bowel sounds.  Musculoskeletal:  . No cyanosis, clubbing, or edema Skin:  . No rashes, lesions, ulcers . palpation of skin: no induration or nodules Neurologic:  . Unable to evaluate due to the patient's inability to cooperate with exam. Psychiatric:  . Unable to evaluate due to  patient's inability to cooperate with exam.  I have personally reviewed the following:   Today's Data  . Vitals, CMP, CBC  Micro Data  . 3/5 blood cultures + for staph caprae methicillin sensitive . Repeat blood cultures have had no growth after 24 hours.  Cardiology Data  . Echocardiogram  Scheduled Meds: . (feeding supplement) PROSource Plus  30 mL Oral BID BM  . Chlorhexidine Gluconate Cloth  6 each Topical Daily  . feeding supplement  237 mL Oral TID BM  . [START ON 11/01/2020] influenza vac split quadrivalent PF  0.5 mL Intramuscular Tomorrow-1000  . magnesium oxide  400 mg Oral TID  . metoprolol tartrate  25 mg Oral BID  . multivitamin with minerals  1 tablet Oral Daily  . polyethylene glycol  17 g Oral Daily  . scopolamine  2 patch Transdermal Q72H   Continuous Infusions: .  ceFAZolin (ANCEF) IV 2 g (10/31/20 1349)  . dextrose 5 % and 0.45 % NaCl with KCl 20 mEq/L 75 mL/hr at 10/31/20 1159  . dextrose 5 %-0.45% NaCl with KCl/Additives Pediatric custom IV fluid    . TPN ADULT (ION)      Principal Problem:   Coag negative Staphylococcus bacteremia Active Problems:   Muscular dystrophy ([place])   On total parenteral nutrition (TPN)   Bacteremia due to coagulase-negative Staphylococcus   Staphylococcus aureus bacteremia   LOS: 4 days   A & P  Staph bacteremia: Likely line sepsis. I have discussed the patient with ID. They recommend that the patient be placed on a TPN holiday for 2 days following removal of PICC. Repeat blood cultures have had no growth x 3 days now. IR has been consulted to place PICC. That will happen later this afternoon.  TPN will be restarted 24 hours after that. ID also recommends discontinuation of Ancef. Continue Daptomycin.  Chronic ostomy: Wound/Ostomy Nurse has been consulted.  Chronic hypoxic respiratory failure: Treligy dependent at home. Continue on BIPAP here. Continue duonebs as at home.  Muscular dystrophy: Noted.  Tachycardia: Likely  due to autonomic dysfunction. The patient was placed on lopressor 25 mg q8hr on 08/09/2021. The father does not recognize the physician's name, and does not know why. It was held this morning due to the patient's low blood pressure. I have reduced the frequency to bid and added parameters to hold it for SBP less than 100 and HR less than 100.  Copious upper airway secretions: This is despite scopalamine patch x 1. I have added a second. 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  Severe Protein Calorie Malnutrition: - Patient had been on home TPN via PICC line -History muscular dystrophy -Body mass index is 14.45 kg/m. Continue nutritional supplements and IV fluids in absence of TPN.  I have seen and examined this patient myself. I have spent 35 minutes in his evaluation and care.  DVT Prophylaxis: lovenox CODE STATUS: Full Code Family Communication: Father at bedside  Disposition: Status is: Inpatient  Remains inpatient appropriate because:Inpatient level of care appropriate due to severity of illness  Dispo: The patient is from: Home              Anticipated d/c is to: Home              Anticipated d/c date is: 2 days              Patient currently is not medically stable to d/c.  Trini Soldo, DO [facility]  Davine Coba, DO [facility] Direct contact: see [email or web address]  7PM-7AM contact night coverage as above 10/31/2020, 4:27 PM  LOS: 0 days

## 2020-10-31 NOTE — Plan of Care (Signed)

## 2020-10-31 NOTE — Procedures (Signed)
Interventional Radiology Procedure Note  Procedure: Placement of a right cephalic vein DL PICC.  Tip is positioned at the superior cavoatrial junction and catheter is ready for immediate use.  Complications: None Recommendations:  - Ok to use - Do not submerge - Routine line care  - Options are limited for future PICC. No right basilic vein on [place].  Small Brachial vein on [place].   Signed,  Dulcy Fanny. Earleen Newport, DO

## 2020-11-01 LAB — GLUCOSE, CAPILLARY
Glucose-Capillary: 135 mg/dL — ABNORMAL HIGH (ref 70–99)
Glucose-Capillary: 116 mg/dL — ABNORMAL HIGH (ref 70–99)
Glucose-Capillary: 126 mg/dL — ABNORMAL HIGH (ref 70–99)
Glucose-Capillary: 131 mg/dL — ABNORMAL HIGH (ref 70–99)

## 2020-11-01 LAB — CULTURE, BLOOD (ROUTINE X 2)
Culture: NO GROWTH
Culture: NO GROWTH
Culture: NO GROWTH
Special Requests: ADEQUATE
Special Requests: ADEQUATE

## 2020-11-01 LAB — BASIC METABOLIC PANEL
Anion gap: 7 (ref 5–15)
BUN: <5 mg/dL — ABNORMAL LOW (ref 6–20)
CO2: 28 mmol/L (ref 22–32)
Calcium: 8.6 mg/dL — ABNORMAL LOW (ref 8.9–10.3)
Chloride: 102 mmol/L (ref 98–111)
Creatinine, Ser: <0.3 mg/dL — ABNORMAL LOW (ref 0.61–1.24)
Glucose, Bld: 129 mg/dL — ABNORMAL HIGH (ref 70–99)
Potassium: 3.8 mmol/L (ref 3.5–5.1)
Sodium: 137 mmol/L (ref 135–145)

## 2020-11-01 LAB — MAGNESIUM: Magnesium: 1.7 mg/dL (ref 1.7–2.4)

## 2020-11-01 LAB — PHOSPHORUS: Phosphorus: 3.6 mg/dL (ref 2.5–4.6)

## 2020-11-01 MED ORDER — KCL IN DEXTROSE-NACL 20-5-0.45 MEQ/L-%-% IV SOLN: INTRAVENOUS | Status: DC

## 2020-11-01 MED ORDER — SCOPOLAMINE 1 MG/3DAYS TD PT72
2.0000 | MEDICATED_PATCH | TRANSDERMAL | 12 refills | Status: AC
Start: 2020-11-02 — End: ?

## 2020-11-01 MED ORDER — METOPROLOL TARTRATE 25 MG PO TABS: 25.0000 mg | ORAL_TABLET | Freq: Two times a day (BID) | ORAL | Status: AC

## 2020-11-01 MED ORDER — HEPARIN SOD (PORK) LOCK FLUSH 100 UNIT/ML IV SOLN
250.0000 [IU] | INTRAVENOUS | Status: AC | PRN
  Administered 2020-11-01: 250 [IU]
  Filled 2020-11-01: qty 2.5

## 2020-11-01 MED ORDER — KCL IN DEXTROSE-NACL 20-5-0.45 MEQ/L-%-% IV SOLN
INTRAVENOUS | Status: DC
  Filled 2020-11-01: qty 1000

## 2020-11-01 MED ORDER — STERILE WATER FOR INJECTION IV SOLN
INTRAVENOUS | Status: DC
  Filled 2020-11-01: qty 528

## 2020-11-01 NOTE — Progress Notes (Signed)
Discharge instructions given to pt and dad.  All questions answered.  IV nurse came to cap and hep flush PICC line.  Script and paperwork sent with.

## 2020-11-01 NOTE — Discharge Summary (Signed)
Physician Discharge Summary  Peter Hayes IOX:[id number] DOB: Feb 09, 1996 DOA: 10/26/2020  PCP: Penni Bombard, PA  Admit date: 10/26/2020 Discharge date: 11/01/2020  Recommendations for Outpatient Follow-up:  1. Discharge to home 2. Continue TPN and cares as previous 3. Follow up with PCP in 7-10 days. Chemistry to be drawn at that visit and reported to PCP. 4. Resume all previous home care orders.  Discharge Diagnoses: Principal diagnosis is #1 1. Line sepsis - staph aureus 2. Chronic ileus 3. TPN feeds 4. Chronic hypoxic respiratory failure - Treligy dependent 5. Copious airway secretions 6. Chronic ostomy 7. Muscular dystrophy  Discharge Condition: Fair  Disposition: Home  Diet recommendation: TPN as previous  Filed Weights   10/26/20 1358 10/27/20 0623  Weight: 35 kg 39.4 kg    History of present illness: Peter Hayes is a 25 y.o. male with medical history significant of MD, chronic ileus s/p ostomy, chronic respiratory failure on chronic BIPAP, pt on chronic TPN through PICC line.  Pt had Line sepsis with S.Auerus in Nov.  [name] has had COVID, pt unvaccinated.  Pt seen in ED on 1/17 for fever.  COVID and flu neg.  BCx obtained.  BCx have come back positive: looks like 4/4 bottles for a CNS.  Pt back in to ED after being called with this.  No fever over 100 since that time in ED.  No SOB, decubitus ulcers apparently all healed up a this point.  Hospital Course: The patient is a 25 yr old man who carries a medical history of muscular dystrophy, chronic ileus (s/p ostomy), chronic BIPAP dependent respiratory failure. The patient has a PICC line through which he receives chronic TPN. He uses Trilegy at home with 2L Oxygen at all times.   The patient was diagnosed with staph aureus bacteremia on 08/24/2020. He completed treatment with IV Cefazolin for 4 weeks which concluded on 09/20/2020. The patient presented to [facility] ED on 1/17 2022 with complaints of  fevers. There was concern as the patient's sister has recently been diagnosed with COVID. The patient's COVID was negative, but blood cultures x 2 were drawn. These came back positive on 10/26/2020. The patient's father who cares for him has been called to bring the patient back for admission.  [facility] have been consulted to admit the patient for further evaluation and treatment. The patient has been admitted to a medical bed. He is accompanied by his father. Infectious disease has been consulted. The patient has had an echocardiogram, and his PICC has been pulled.  Repeat blood cultures were drawn on 10/27/2020. PICC line will be replaced if they remain without growth for 48 hours. The initial cultures have grown out Staph Caprae. This is methacillin sensitive. The patient's antibiotic has been narrowed to Ancef. If the cultures remain negative, the ancef will need to be continued for 7 days with today being day 1. The patient will complete his IV antibiotics at home.  I appreciate Infectious disease's assistance.  Echocardiogram has been completed: It demonstrates LVEF of 60-65% with no wall motion abnormalities and normal function. RV systolic function is normal with normal RVSP. No vegetations are seen.  The patient's blood cultures have had no growth x 72 hours. PICC team was unable to place PICC. IR has placed the patient's PICC on 10/31/2020. The patient's TPN has been restarted.  The patient will be discharged to home today in fair condition.  Today's assessment: S: The patient is resting quietly. No new complaints. O: Vitals:  Vitals:   11/01/20 0945 11/01/20 1338  BP:  91/60  Pulse:  79  Resp: 17 18  Temp:  98.7 F (37.1 C)  SpO2:  100%   Exam:  Constitutional:  The patient is resting quietly. He is not verbally responsive. Father is at bedside.  Respiratory:   No increased work of breathing.  No wheezes, rales, or rhonchi  No tactile  fremitus Cardiovascular:   Regular rate and rhythm, tachycardic  No murmurs, ectopy, or gallups.  No lateral PMI. No thrills. Abdomen:   Abdomen is soft, non-tender, non-distended  No hernias, masses, or organomegaly  Normoactive bowel sounds.  Musculoskeletal:   No cyanosis, clubbing, or edema Skin:   No rashes, lesions, ulcers  palpation of skin: no induration or nodules Neurologic:   Unable to evaluate due to the patient's inability to cooperate with exam. Psychiatric:   Unable to evaluate due to patient's inability to cooperate with exam.  Discharge Instructions  Discharge Instructions    Activity as tolerated - No restrictions   Complete by: As directed    [facility] pharmacist to adjust dose for Vancomycin, Aminoglycosides and other anti-infective therapies as requested by physician.   Complete by: As directed    [facility] to provide Cath Flo 50m   Complete by: As directed    Administer for PICC line occlusion and as ordered by physician for other access device issues.   Anaphylaxis Kit: Provided to treat any anaphylactic reaction to the medication being provided to the patient if First Dose or when requested by physician   Complete by: As directed    Epinephrine 160mml vial / amp: Administer 0.3m62m0.3ml93mubcutaneously once for moderate to severe anaphylaxis, nurse to call physician and pharmacy when reaction occurs and call 911 if needed for immediate care   Diphenhydramine 50mg31mIV vial: Administer 25-50mg 58mM PRN for first dose reaction, rash, itching, mild reaction, nurse to call physician and pharmacy when reaction occurs   Sodium Chloride 0.9% NS 500ml I19mdminister if needed for hypovolemic blood pressure drop or as ordered by physician after call to physician with anaphylactic reaction   Call MD for:  difficulty breathing, headache or visual disturbances   Complete by: As directed    Call MD for:  persistant nausea and  vomiting   Complete by: As directed    Call MD for:  temperature >100.4   Complete by: As directed    Change dressing on IV access line weekly and PRN   Complete by: As directed    Discharge instructions   Complete by: As directed    Discharge to home Continue TPN and cares as previous Follow up with PCP in 7-10 days. Chemistry to be drawn at that visit and reported to PCP. Resume all previous home care orders.   Flush IV access with Sodium Chloride 0.9% and Heparin 10 units/ml or 100 units/ml   Complete by: As directed    Home infusion instructions - [facility]   Complete by: As directed    Instructions: Flush IV access with Sodium Chloride 0.9% and Heparin 10units/ml or 100units/ml   Change dressing on IV access line: Weekly and PRN   Instructions Cath Flo 2mg: Ad67mister for PICC Line occlusion and as ordered by physician for other access device   [facility] pharmacist to adjust dose for: Vancomycin, Aminoglycosides and other anti-infective therapies as requested by physician   Increase activity slowly   Complete by: As directed  Method of administration may be changed at the discretion of home infusion pharmacist based upon assessment of the patient and/or caregiver's ability to self-administer the medication ordered   Complete by: As directed    No wound care   Complete by: As directed      Allergies as of 11/01/2020      Reactions   Fentanyl Other (See Comments)   Dizziness   Other Other (See Comments)   As per father allergic to one more medicine, but don't know the name.      Medication List    TAKE these medications   acetaminophen 325 MG tablet Commonly known as: TYLENOL Take 400 mg by mouth every 6 (six) hours as needed for fever.   Argyle Suction Catheter 14FR Misc 1 packet by Does not apply route daily.   ceFAZolin  IVPB Commonly known as: ANCEF Inject 2 g into the vein every 8 (eight) hours for 7 days. Indication:  Bacteremia   First Dose: No Last Day of Therapy:  11/04/2020 Labs - Once weekly:  CBC/D and BMP, Labs - Every other week:  ESR and CRP Method of administration: IV Push Method of administration may be changed at the discretion of home infusion pharmacist based upon assessment of the patient and/or caregiver's ability to self-administer the medication ordered.   feeding supplement Liqd You can use whatever supplement he likes.  He needs about 1300 calories per day. 1.5 liters of fluid and 50 grams of protein per day. You can buy this at the grocery or drug store.  You do not need a prescription. What changed:   how much to take  how to take this  when to take this  reasons to take this  additional instructions   guaiFENesin 100 MG/5ML Soln Commonly known as: ROBITUSSIN Take 15 mLs (300 mg total) by mouth every 6 (six) hours. What changed:   when to take this  reasons to take this   ipratropium-albuterol 0.5-2.5 (3) MG/3ML Soln Commonly known as: DUONEB Take 3 mLs by nebulization every 6 (six) hours as needed. What changed: reasons to take this   magnesium oxide 400 MG tablet Commonly known as: MAG-OX Take 400 mg by mouth 3 (three) times daily.   metoprolol tartrate 25 MG tablet Commonly known as: LOPRESSOR Take 1 tablet (25 mg total) by mouth 2 (two) times daily. What changed: when to take this   multivitamin with minerals Tabs tablet Take 1 tablet by mouth daily.   ondansetron 4 MG tablet Commonly known as: ZOFRAN Take 1 tablet (4 mg total) by mouth every 6 (six) hours as needed for nausea. What changed: when to take this   polyethylene glycol 17 g packet Commonly known as: MIRALAX / GLYCOLAX Follow package instruction for daily use.  You need to have one soft bowel movement per day.  If he is not doing this call your primary care doctor.  You also need to be sure he takes in 1.5 liters of fluid per day.   He needs protein supplement daily, and needs to take in about 1300  calories per day. What changed:   how much to take  how to take this  when to take this  additional instructions   scopolamine 1 MG/3DAYS Commonly known as: TRANSDERM-SCOP Place 2 patches (3 mg total) onto the skin every 3 (three) days. Start taking on: November 02, 2020 What changed: how much to take   sterile water SOLN with amino acids 10 % SOLN 1.3  g/kg, dextrose 70 % SOLN 20 % Inject 1,000 mLs into the vein continuous. TPN 1000 ml w/ lipids  Directions: Infuse TPN 101m IV through PICC line via CADD SOLIS infusion pump daily over 18 hours with 1 hr ramp up and 1 hr ramp down. Add MVI 10 ml as directed to each bag prior to infusion. MIX WELL VTBI: 1000 ML Bag Volume  1050 ml     Dosing Weight for the patient 72 lbs            Discharge Care Instructions  (From admission, onward)         Start     Ordered   10/31/20 0000  Change dressing on IV access line weekly and PRN  (Home infusion instructions - [facility] )        10/31/20 1122         Allergies  Allergen Reactions  . Fentanyl Other (See Comments)    Dizziness  . Other Other (See Comments)    As per father allergic to one more medicine, but don't know the name.    The results of significant diagnostics from this hospitalization (including imaging, microbiology, ancillary and laboratory) are listed below for reference.    Significant Diagnostic Studies: DG Chest 2 View  Result Date: 10/26/2020 CLINICAL DATA:  Bacteremia EXAM: CHEST - 2 VIEW COMPARISON:  10/24/2020 FINDINGS: Rotated. No new consolidation or edema. No pleural effusion. No pneumothorax. Stable cardiomediastinal contours. Left PICC line is unchanged. IMPRESSION: No acute process in the chest. Electronically Signed   By: PMacy MisM.D.   On: 10/26/2020 15:25   DG Chest Port 1 View  Result Date: 10/24/2020 CLINICAL DATA:  None COVID exposure 1 day ago with fevers, initial encounter EXAM: PORTABLE CHEST 1 VIEW COMPARISON:   08/26/2020 FINDINGS: Cardiac shadow is within normal limits. The lungs are well aerated bilaterally. Mild scarring is noted stable from the prior exam. No acute infiltrate or sizable effusion is seen. No bony abnormality is noted. IMPRESSION: No acute abnormality noted. Electronically Signed   By: MInez CatalinaM.D.   On: 10/24/2020 21:16   ECHOCARDIOGRAM COMPLETE  Result Date: 10/27/2020    ECHOCARDIOGRAM REPORT   Patient Name:   HTOMOTHY EDDINSDate of Exam: 10/27/2020 Medical Rec #:  [id number] Height:       65.0 in Accession #:    [id number]:       86.9 lb Date of Birth:  [birth year] BSA:          1.388 m Patient Age:    24 years   BP:           102/51 mmHg Patient Gender: M          HR:           120 bpm. Exam Location:  Inpatient Procedure: 2D Echo, Cardiac Doppler and Color Doppler Indications:    Bactermia  History:        Patient has prior history of Echocardiogram examinations, most                 recent 08/21/2020. Signs/Symptoms:Bacteremia and Fever; Risk                 Factors:Hypertension. Resp. failure, Bipap, pneumonia, sepsis.  Sonographer:    BDustin FlockReferring Phys: [id number] Dimetri Armitage  Sonographer Comments: Patient has muscular dystrophy so images are off axis IMPRESSIONS  1. Left ventricular ejection fraction, by estimation, is 60 to 65%. The  left ventricle has normal function. The left ventricle has no regional wall motion abnormalities. Left ventricular diastolic parameters were normal.  2. Right ventricular systolic function is normal. The right ventricular size is normal. There is normal pulmonary artery systolic pressure.  3. The mitral valve is grossly normal. No evidence of mitral valve regurgitation. No evidence of mitral stenosis.  4. The aortic valve is tricuspid. Aortic valve regurgitation is not visualized. No aortic stenosis is present. Comparison(s): No significant change from prior study. Conclusion(s)/Recommendation(s): No evidence of valvular vegetations on this  transthoracic echocardiogram. Would recommend a transesophageal echocardiogram to exclude infective endocarditis if clinically indicated. FINDINGS  Left Ventricle: Left ventricular ejection fraction, by estimation, is 60 to 65%. The left ventricle has normal function. The left ventricle has no regional wall motion abnormalities. The left ventricular internal cavity size was normal in size. There is  no left ventricular hypertrophy. Left ventricular diastolic parameters were normal. Right Ventricle: The right ventricular size is normal. Right vetricular wall thickness was not well visualized. Right ventricular systolic function is normal. There is normal pulmonary artery systolic pressure. The tricuspid regurgitant velocity is 2.56 m/s, and with an assumed right atrial pressure of 3 mmHg, the estimated right ventricular systolic pressure is 84.1 mmHg. Left Atrium: Left atrial size was normal in size. Right Atrium: Right atrial size was normal in size. Pericardium: There is no evidence of pericardial effusion. Mitral Valve: The mitral valve is grossly normal. No evidence of mitral valve regurgitation. No evidence of mitral valve stenosis. Tricuspid Valve: The tricuspid valve is normal in structure. Tricuspid valve regurgitation is trivial. No evidence of tricuspid stenosis. Aortic Valve: The aortic valve is tricuspid. Aortic valve regurgitation is not visualized. No aortic stenosis is present. Pulmonic Valve: The pulmonic valve was grossly normal. Pulmonic valve regurgitation is not visualized. No evidence of pulmonic stenosis. Aorta: The aortic root and ascending aorta are structurally normal, with no evidence of dilitation. IAS/Shunts: The atrial septum is grossly normal.  LEFT VENTRICLE PLAX 2D LVIDd:         4.40 cm  Diastology LVIDs:         2.80 cm  LV e' medial:    5.77 cm/s LV PW:         0.60 cm  LV E/e' medial:  15.6 LV IVS:        0.70 cm  LV e' lateral:   9.46 cm/s LVOT diam:     1.80 cm  LV E/e' lateral:  9.5 LV SV:         44 LV SV Index:   31 LVOT Area:     2.54 cm  RIGHT VENTRICLE RV Basal diam:  2.40 cm RV S prime:     8.92 cm/s TAPSE (M-mode): 1.8 cm LEFT ATRIUM             Index       RIGHT ATRIUM          Index LA diam:        3.30 cm 2.38 cm/m  RA Area:     5.56 cm LA Vol (A2C):   24.7 ml 17.80 ml/m RA Volume:   8.78 ml  6.33 ml/m LA Vol (A4C):   18.0 ml 12.97 ml/m LA Biplane Vol: 21.4 ml 15.42 ml/m  AORTIC VALVE LVOT Vmax:   102.00 cm/s LVOT Vmean:  69.700 cm/s LVOT VTI:    0.171 m  AORTA Ao Root diam: 2.40 cm MITRAL VALVE  TRICUSPID VALVE MV Area (PHT): 8.62 cm    TR Peak grad:   26.2 mmHg MV Decel Time: 88 msec     TR Vmax:        256.00 cm/s MV E velocity: 90.30 cm/s MV A velocity: 82.60 cm/s  SHUNTS MV E/A ratio:  1.09        Systemic VTI:  0.17 m                            Systemic Diam: 1.80 cm Buford Dresser MD Electronically signed by Buford Dresser MD Signature Date/Time: 10/27/2020/12:27:21 PM    Final    IR PICC PLACEMENT RIGHT >5 YRS INC IMG GUIDE  Result Date: 10/31/2020 INDICATION: 25 year old male referred for PICC EXAM: PICC LINE PLACEMENT WITH ULTRASOUND AND FLUOROSCOPIC GUIDANCE MEDICATIONS: None ANESTHESIA/SEDATION: None FLUOROSCOPY TIME:  Fluoroscopy Time: 1 minutes 42 seconds (1 mGy). COMPLICATIONS: None PROCEDURE: Informed written consent was obtained from the patient after a thorough discussion of the procedural risks, benefits and alternatives. All questions were addressed. Maximal Sterile Barrier Technique was utilized including caps, mask, sterile gowns, sterile gloves, sterile drape, hand hygiene and skin antiseptic. A timeout was performed prior to the initiation of the procedure. Patient was position in the supine position on the fluoroscopy table with the right arm abducted 90 degrees. Ultrasound survey of the upper extremity was performed with images stored and sent to PACs. The right brachial vein was initially selected for access. Once  the patient was prepped and draped in the usual sterile fashion, the skin and subcutaneous tissues were generously infiltrated with 1% lidocaine for local anesthesia. Ultrasound-guided access was attempted on 3 attempts of the right brachial vein. Right brachial vein is quite small and the wire would not thread. We then attempted to use the cephalic vein. 1% lidocaine was used for local anesthesia. A micropuncture access kit was then used to access the targeted cephalic vein. Wire was passed centrally, confirmed to be within the venous system under fluoroscopy. A small stab incision was made with an 11 blade scalpel and the sheath was then placed over the wire. Estimated length of the catheter was then performed with the indwelling wire. Catheter was amputated at 35 cm length and placed with coaxial wire through the peel-away. Double lumen, power injectable PICC in the cephalic vein. Tip confirmed at the cavoatrial junction, and the catheter is ready for use. Stat lock was placed. Patient tolerated the procedure well and remained hemodynamically stable throughout. No complications were encountered and no significant blood loss was encountered. IMPRESSION: Status post image guided placement of right cephalic vein double-lumen PICC. Signed, Dulcy Fanny. Dellia Nims, RPVI [facility] Electronically Signed   By: Corrie Mckusick D.O.   On: 10/31/2020 18:18   [place] EKG SITE RITE  Result Date: 10/29/2020 If [facility] image not attached, placement could not be confirmed due to current cardiac rhythm.   Microbiology: Recent Results (from the past 240 hour(s))  Resp Panel by RT-PCR (Flu A&B, Covid) Nasopharyngeal Swab     Status: None   Collection Time: 10/24/20  8:12 PM   Specimen: Nasopharyngeal Swab; Nasopharyngeal(NP) swabs in vial transport medium  Result Value Ref Range Status   SARS Coronavirus 2 by RT PCR NEGATIVE NEGATIVE Final    Comment:  (NOTE) SARS-CoV-2 target nucleic acids are NOT DETECTED.  The SARS-CoV-2 RNA is generally detectable in upper respiratory specimens during the acute phase  of infection. The lowest concentration of SARS-CoV-2 viral copies this assay can detect is 138 copies/mL. A negative result does not preclude SARS-Cov-2 infection and should not be used as the sole basis for treatment or other patient management decisions. A negative result may occur with  improper specimen collection/handling, submission of specimen other than nasopharyngeal swab, presence of viral mutation(s) within the areas targeted by this assay, and inadequate number of viral copies(<138 copies/mL). A negative result must be combined with clinical observations, patient history, and epidemiological information. The expected result is Negative.  Fact Sheet for Patients:  EntrepreneurPulse.com.au  Fact Sheet for Healthcare Providers:  IncredibleEmployment.be  This test is no t yet approved or cleared by the [place] FDA and  has been authorized for detection and/or diagnosis of SARS-CoV-2 by FDA under an Emergency Use Authorization (EUA). This EUA will remain  in effect (meaning this test can be used) for the duration of the COVID-19 declaration under Section 564(b)(1) of the Act, 21 U.S.C.section 360bbb-3(b)(1), unless the authorization is terminated  or revoked sooner.       Influenza A by PCR NEGATIVE NEGATIVE Final   Influenza B by PCR NEGATIVE NEGATIVE Final    Comment: (NOTE) The Xpert Xpress SARS-CoV-2/FLU/RSV plus assay is intended as an aid in the diagnosis of influenza from Nasopharyngeal swab specimens and should not be used as a sole basis for treatment. Nasal washings and aspirates are unacceptable for Xpert Xpress SARS-CoV-2/FLU/RSV testing.  Fact Sheet for Patients: EntrepreneurPulse.com.au  Fact Sheet for Healthcare  Providers: IncredibleEmployment.be  This test is not yet approved or cleared by the [place] FDA and has been authorized for detection and/or diagnosis of SARS-CoV-2 by FDA under an Emergency Use Authorization (EUA). This EUA will remain in effect (meaning this test can be used) for the duration of the COVID-19 declaration under Section 564(b)(1) of the Act, 21 U.S.C. section 360bbb-3(b)(1), unless the authorization is terminated or revoked.  Performed at [facility], [address] [place], [place] [address]   Culture, blood (routine x 2)     Status: Abnormal   Collection Time: 10/24/20 10:40 PM   Specimen: BLOOD  Result Value Ref Range Status   Specimen Description   Final    BLOOD LEFT ANTECUBITAL Performed at [facility], [address] [place], [place] [address]    Special Requests   Final    BOTTLES DRAWN AEROBIC AND ANAEROBIC Blood Culture adequate volume Performed at [facility], [address] [place], [place] [address]    Culture  Setup Time   Final    IN BOTH AEROBIC AND ANAEROBIC BOTTLES GRAM POSITIVE COCCI CRITICAL RESULT CALLED TO, READ BACK BY AND VERIFIED WITH: Avie Echevaria RN 10/26/20 0334 JDW Performed at [facility], [place] [address] [place], [place] [address]    Culture STAPHYLOCOCCUS CAPRAE (A)  Final   Report Status 10/28/2020 FINAL  Final   Organism ID, Bacteria STAPHYLOCOCCUS CAPRAE  Final      Susceptibility   Staphylococcus caprae - MIC*    CIPROFLOXACIN <=0.5 SENSITIVE Sensitive     ERYTHROMYCIN <=0.25 SENSITIVE Sensitive     GENTAMICIN <=0.5 SENSITIVE Sensitive     OXACILLIN <=0.25 SENSITIVE Sensitive     TETRACYCLINE <=1 SENSITIVE Sensitive     VANCOMYCIN 1 SENSITIVE Sensitive     TRIMETH/SULFA <=10 SENSITIVE Sensitive     CLINDAMYCIN <=0.25 SENSITIVE Sensitive     RIFAMPIN <=0.5 SENSITIVE Sensitive  Inducible Clindamycin NEGATIVE Sensitive     * STAPHYLOCOCCUS CAPRAE  Blood  Culture ID Panel (Reflexed)     Status: Abnormal   Collection Time: 10/24/20 10:40 PM  Result Value Ref Range Status   Enterococcus faecalis NOT DETECTED NOT DETECTED Final   Enterococcus Faecium NOT DETECTED NOT DETECTED Final   Listeria monocytogenes NOT DETECTED NOT DETECTED Final   Staphylococcus species DETECTED (A) NOT DETECTED Final    Comment: CRITICAL RESULT CALLED TO, READ BACK BY AND VERIFIED WITH: L ADKINS RN 10/26/20 0334 JDW    Staphylococcus aureus (BCID) NOT DETECTED NOT DETECTED Final   Staphylococcus epidermidis NOT DETECTED NOT DETECTED Final   Staphylococcus lugdunensis NOT DETECTED NOT DETECTED Final   Streptococcus species NOT DETECTED NOT DETECTED Final   Streptococcus agalactiae NOT DETECTED NOT DETECTED Final   Streptococcus pneumoniae NOT DETECTED NOT DETECTED Final   Streptococcus pyogenes NOT DETECTED NOT DETECTED Final   A.calcoaceticus-baumannii NOT DETECTED NOT DETECTED Final   Bacteroides fragilis NOT DETECTED NOT DETECTED Final   Enterobacterales NOT DETECTED NOT DETECTED Final   Enterobacter cloacae complex NOT DETECTED NOT DETECTED Final   Escherichia coli NOT DETECTED NOT DETECTED Final   Klebsiella aerogenes NOT DETECTED NOT DETECTED Final   Klebsiella oxytoca NOT DETECTED NOT DETECTED Final   Klebsiella pneumoniae NOT DETECTED NOT DETECTED Final   Proteus species NOT DETECTED NOT DETECTED Final   Salmonella species NOT DETECTED NOT DETECTED Final   Serratia marcescens NOT DETECTED NOT DETECTED Final   Haemophilus influenzae NOT DETECTED NOT DETECTED Final   Neisseria meningitidis NOT DETECTED NOT DETECTED Final   Pseudomonas aeruginosa NOT DETECTED NOT DETECTED Final   Stenotrophomonas maltophilia NOT DETECTED NOT DETECTED Final   Candida albicans NOT DETECTED NOT DETECTED Final   Candida auris NOT DETECTED NOT DETECTED Final   Candida glabrata NOT DETECTED NOT DETECTED Final   Candida krusei NOT DETECTED NOT DETECTED Final   Candida  parapsilosis NOT DETECTED NOT DETECTED Final   Candida tropicalis NOT DETECTED NOT DETECTED Final   Cryptococcus neoformans/gattii NOT DETECTED NOT DETECTED Final    Comment: Performed at [facility], [address], [place], [place] [address]  Culture, blood (routine x 2)     Status: Abnormal   Collection Time: 10/25/20 12:05 AM   Specimen: BLOOD LEFT HAND  Result Value Ref Range Status   Specimen Description   Final    BLOOD LEFT HAND Performed at [facility], [address] [place], [place] [address]    Special Requests   Final    BOTTLES DRAWN AEROBIC AND ANAEROBIC Blood Culture results may not be optimal due to an inadequate volume of blood received in culture bottles Performed at [facility], [address] [place], [place] [address]    Culture  Setup Time   Final    GRAM POSITIVE COCCI IN BOTH AEROBIC AND ANAEROBIC BOTTLES CRITICAL VALUE NOTED.  VALUE IS CONSISTENT WITH PREVIOUSLY REPORTED AND CALLED VALUE.    Culture (A)  Final    STAPHYLOCOCCUS CAPRAE SUSCEPTIBILITIES PERFORMED ON PREVIOUS CULTURE WITHIN THE LAST 5 DAYS. Performed at [facility], [place] [address], [place], [place] [address]    Report Status 10/28/2020 FINAL  Final  Culture, blood (routine x 2)     Status: Abnormal   Collection Time: 10/26/20  6:18 PM   Specimen: BLOOD  Result Value Ref Range Status   Specimen Description   Final    BLOOD PICC  LINE Performed at [facility], [place] [address], [place], [place] [address]    Special Requests   Final    BOTTLES DRAWN AEROBIC AND ANAEROBIC Blood Culture adequate volume Performed at [place] [address], [place], [place] [address]    Culture  Setup Time   Final    GRAM POSITIVE COCCI IN CLUSTERS IN BOTH AEROBIC AND ANAEROBIC BOTTLES CRITICAL VALUE NOTED.  VALUE IS CONSISTENT WITH PREVIOUSLY REPORTED AND CALLED VALUE.    Culture (A)  Final    STAPHYLOCOCCUS  CAPRAE SUSCEPTIBILITIES PERFORMED ON PREVIOUS CULTURE WITHIN THE LAST 5 DAYS. Performed at [facility], [place] [address] [place], [place] [address]    Report Status 10/29/2020 FINAL  Final  SARS CORONAVIRUS 2 (TAT 6-24 HRS) Nasopharyngeal Nasopharyngeal Swab     Status: None   Collection Time: 10/26/20  6:33 PM   Specimen: Nasopharyngeal Swab  Result Value Ref Range Status   SARS Coronavirus 2 NEGATIVE NEGATIVE Final    Comment: (NOTE) SARS-CoV-2 target nucleic acids are NOT DETECTED.  The SARS-CoV-2 RNA is generally detectable in upper and lower respiratory specimens during the acute phase of infection. Negative results do not preclude SARS-CoV-2 infection, do not rule out co-infections with other pathogens, and should not be used as the sole basis for treatment or other patient management decisions. Negative results must be combined with clinical observations, patient history, and epidemiological information. The expected result is Negative.  Fact Sheet for Patients: SugarRoll.be  Fact Sheet for Healthcare Providers: https://www.woods-mathews.com/  This test is not yet approved or cleared by the [place] FDA and  has been authorized for detection and/or diagnosis of SARS-CoV-2 by FDA under an Emergency Use Authorization (EUA). This EUA will remain  in effect (meaning this test can be used) for the duration of the COVID-19 declaration under Se ction 564(b)(1) of the Act, 21 U.S.C. section 360bbb-3(b)(1), unless the authorization is terminated or revoked sooner.  Performed at [facility], [place] [address], [place], [place] [address]   Culture, blood (routine x 2)     Status: None (Preliminary result)   Collection Time: 10/27/20  7:24 AM   Specimen: BLOOD RIGHT HAND  Result Value Ref Range Status   Specimen Description BLOOD RIGHT HAND  Final   Special Requests   Final    BOTTLES DRAWN AEROBIC ONLY Blood Culture  results may not be optimal due to an inadequate volume of blood received in culture bottles   Culture   Final    NO GROWTH 4 DAYS Performed at [facility], [place] [address] [place], [place] [address]    Report Status PENDING  Incomplete  Culture, blood (Routine X 2) w Reflex to ID Panel     Status: None (Preliminary result)   Collection Time: 10/27/20  3:08 PM   Specimen: BLOOD RIGHT HAND  Result Value Ref Range Status   Specimen Description BLOOD RIGHT HAND  Final   Special Requests   Final    BOTTLES DRAWN AEROBIC ONLY Blood Culture adequate volume   Culture   Final    NO GROWTH 4 DAYS Performed at [facility], [place] [address] [place],  [address]    Report Status PENDING  Incomplete  Culture, blood (Routine X 2) w Reflex to ID Panel     Status: None (Preliminary result)   Collection Time: 10/27/20  3:13 PM   Specimen: BLOOD LEFT FOREARM  Result Value Ref Range  Status   Specimen Description BLOOD LEFT FOREARM  Final   Special Requests   Final    BOTTLES DRAWN AEROBIC ONLY Blood Culture adequate volume   Culture   Final    NO GROWTH 4 DAYS Performed at [facility], [address], [place], [place] [address]    Report Status PENDING  Incomplete     Labs: Basic Metabolic Panel: Recent Labs  Lab 10/27/20 0825 10/28/20 0452 10/29/20 0249 10/30/20 0101 10/31/20 0251 11/01/20 0500  NA 139 135 134* 136 138 137  K 3.9 3.7 3.9 3.7 3.9 3.8  CL 100 102 101 101 104 102  CO2 21* '23 24 27 27 28  ' GLUCOSE 32* 90 87 88 94 129*  BUN 17 9 <5* <5* <5* <5*  CREATININE <0.30* <0.30* <0.30* <0.30* <0.30* <0.30*  CALCIUM 9.4 8.3* 8.1* 8.2* 8.6* 8.6*  MG 1.6* 1.4* 1.6* 2.1 1.7 1.7  PHOS 3.8 3.8  --   --  3.1 3.6   Liver Function Tests: Recent Labs  Lab 10/27/20 0825 10/28/20 0452 10/29/20 0249 10/30/20 0101 10/31/20 0251  AST 191* 84* 57* 33 28  ALT 154* 119* 92* 60* 40  ALKPHOS 152* 127* 111 106 97  BILITOT 2.1* 1.1 1.2 1.1 1.0  PROT 7.5 6.3* 5.8*  5.9* 6.1*  ALBUMIN 3.8 3.1* 2.9* 2.9* 2.9*   No results for input(s): LIPASE, AMYLASE in the last 168 hours. No results for input(s): AMMONIA in the last 168 hours. CBC: Recent Labs  Lab 10/27/20 0825 10/28/20 0452 10/29/20 0249 10/30/20 0101 10/31/20 0251  WBC 4.9 3.1* 4.0 3.4* 4.4  NEUTROABS 3.6 1.1* 2.0 1.5* 1.9  HGB 12.0* 9.9* 9.5* 9.8* 10.5*  HCT 38.6* 30.0* 29.9* 29.8* 32.8*  MCV 83.5 80.9 81.3 80.5 81.0  PLT PLATELET CLUMPS NOTED ON SMEAR, UNABLE TO ESTIMATE 90* 94* 110* 138*   Cardiac Enzymes: Recent Labs  Lab 10/27/20 0825  CKTOTAL 203   BNP: BNP (last 3 results) No results for input(s): BNP in the last 8760 hours.  ProBNP (last 3 results) No results for input(s): PROBNP in the last 8760 hours.  CBG: Recent Labs  Lab 10/31/20 1615 11/01/20 0105 11/01/20 0355 11/01/20 0840 11/01/20 1333  GLUCAP 95 135* 116* 126* 131*    Principal Problem:   Coag negative Staphylococcus bacteremia Active Problems:   Muscular dystrophy ([place])   On total parenteral nutrition (TPN)   Bacteremia due to coagulase-negative Staphylococcus   Staphylococcus aureus bacteremia   Time coordinating discharge: 38 minutes.  Signed:        Jonita Hirota, DO [facility]  11/01/2020, 3:17 PM

## 2020-11-01 NOTE — TOC Transition Note (Signed)
Transition of Care [facility]) - CM/SW Discharge Note   Patient Details  Name: Peter Hayes MRN: [id number] Date of Birth: 1996-08-28  [facility] CM/SW Contact:  Pollie Friar, RN Phone Number: 11/01/2020, 12:26 PM   Clinical Narrative:    Pt discharging home with [facility] services through [place]. Pt was already active with [facility] for home care and TPN. CM has updated Herbie [place] with [facility] of d/c home today. CM has updated Pam with [facility] of d/c and she has orders for the IV abx for home and will check in the patients father on any needs.  Pt has transportation home.    Final next level of care: [place] Barriers to Discharge: No Barriers Identified   Patient Goals and CMS Choice        Discharge Placement                       Discharge Plan and Services                                     Social Determinants of Health (SDOH) Interventions     Readmission Risk Interventions No flowsheet data found.

## 2020-11-01 NOTE — Progress Notes (Addendum)
PHARMACY - TOTAL PARENTERAL NUTRITION CONSULT NOTE  Indication: severe protein-calorie malnutrition  Patient Measurements: Height: 5\' 5"  (165.1 cm) Weight: 39.4 kg (86 lb 13.8 oz) IBW/kg (Calculated) : 61.5 TPN AdjBW (KG): 39.4 Body mass index is 14.45 kg/m. 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  Patient admitted with Staph species noted in 4/4 bottles on 1/17 after being evaluated in the ED for COVID exposure with fevers. Pharmacy consulted to manage TPN.  Last home TPN was 1/18; TPN was held 1/19 >> 1/22 for line holiday.  Unable to place PICC 10/31/19. PICC placed 1/24.   Glucose / Insulin: no hx DM - CBGs 116-135 since TPN start last night Electrolytes: all WNL (Mag low normal at 1.7on scheduled MagOx 400mg  PO TID) Renal: SCr <0.3, BUN <5 LFTs / TGs: LFT / tbili / TG WNL Prealbumin / albumin: albumin 2.9, prealbumin down to 8.7 from being off TPN since 1/18 Intake / Output; MIVF: UOP documentation inaccurate, D545NS 20K at 45 ml/hr, LBM 1/24 GI Imaging: none this admit Surgeries / Procedures: none this admit  Central access: PICC TPN start date: 1/24>>  Nutritional Goals (per RD rec on 1/21): kCal: 1400-1600, Protein: 70-80, Fluid: > 1.4L Goal TPN: 1440 ml cycled over 18 hrs (provides 79g AA and average 1560 kCal per day) - AA 55g/L, CHO 21%, 35g/L ILE on MWF  Outpatient TPN Formula faxed over from [place] (in media section of chart): Cyclic TPN cycled 1L over 18 hrs: 54g protein, 160g CHO, 24g ILE for a total of 999 kCal/day Lytes: K 65.5 mEq/day, Phos 17.8 mMol/day, Mg 12 mEq/day, no Ca, Cl:Ac 2:1  Current Nutrition:  TPN Full liquid diet - consumes up to 15% of meals Ensure Enlive TID (each provides 350 kcal and 20 g protein) - none charted given Magic cup TID (each provides 290 kcal and 9 g protein) Prosource Plus BID (each provides 100 kcals and 15 g protein) - none charted  given  Plan:  Increase TPN to goal 60 ml/hr at 1800 but will only provide lipids on MWF due to national shortage - provides 79g protein and 1345kcal meeting 96% patient needs Electrolytes in TPN: Na 24mEq/L, K 41mEq/L, no Ca as pta, Mag 2mEq/L, Phos 35mmol/L, Cl:Ac 1:1 (all electrolytes will increase with increasing rate) No multivitamin and trace element in TPN - on PO multivitamin Monitor CBGs - initiate SSI if needed Reduce IVF to [facility] when TPN starts F/U AM labs Cycle TPN when stable on goal rate  Salome Arnt, PharmD, BCPS Clinical Pharmacist Please see AMION for all pharmacy numbers 11/01/2020 8:47 AM

## 2020-11-29 ENCOUNTER — Other Ambulatory Visit: Payer: Self-pay

## 2020-11-29 ENCOUNTER — Telehealth (INDEPENDENT_AMBULATORY_CARE_PROVIDER_SITE_OTHER): Payer: Managed Care, Other (non HMO) | Admitting: Infectious Disease

## 2020-11-29 DIAGNOSIS — B957 Other staphylococcus as the cause of diseases classified elsewhere: Secondary | ICD-10-CM

## 2020-11-29 DIAGNOSIS — K567 Ileus, unspecified: Secondary | ICD-10-CM

## 2020-11-29 DIAGNOSIS — Z789 Other specified health status: Secondary | ICD-10-CM | POA: Diagnosis not present

## 2020-11-29 DIAGNOSIS — R7881 Bacteremia: Secondary | ICD-10-CM

## 2020-11-29 DIAGNOSIS — G7100 Muscular dystrophy, unspecified: Secondary | ICD-10-CM | POA: Diagnosis not present

## 2020-11-29 NOTE — Progress Notes (Signed)
Virtual Visit via Telephone Note  I connected with [name] on 11/29/20 at  4:15 PM EST by telephone and verified that I am speaking with the correct person using two identifiers.  Location: Patient: Home Provider: My Home   I discussed the limitations, risks, security and privacy concerns of performing an evaluation and management service by telephone and the availability of in person appointments. I also discussed with the patient that there may be a patient responsible charge related to this service. The patient expressed understanding and agreed to proceed.   History of Present Illness:  25 y.o. 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  PICC line was removed he was switched to daptomycin when the coagulase-negative staphylococcal species was identified on admission.  Organism turns out to be Staphylococcus CAPRAE Which turned out to be MS as well.  He had TTE which was negative.  We had him complete a 7 day course of ancef.  I was able to get ahold of his father after three attempts with [place] translator and in fact it seemed as if he responded better to being called in [place].  Peter Hayes is doing well. No signs or symptom of infection.  Past Medical History:  Diagnosis Date  . Acute respiratory failure with hypoxia and hypercapnia ([place]) 05/16/2018  . HCAP (healthcare-associated pneumonia) 12/01/2017  . High anion gap metabolic acidosis 9/48/5462  . Hypotension   . Ileus ([place]) 09/23/2017  . Leukocytosis 05/05/2016  . MD (muscular dystrophy) ([employer])   . Refusal of blood product    [name]  . Respiratory alkalosis 05/05/2016  . Sepsis ([place]) 05/05/2016  . Severe sepsis ([place]) 09/16/2019  . Sinus tachycardia 05/05/2016    Past  Surgical History:  Procedure Laterality Date  . COLOSTOMY    . EYE SURGERY    . FLEXIBLE SIGMOIDOSCOPY N/A 01/10/2018   Procedure: FLEXIBLE SIGMOIDOSCOPY;  Surgeon: Laurence Spates, MD;  Location: [facility] END[facility]COPY;  Service: Endoscopy;  Laterality: N/A;  . SMALL INTESTINE SURGERY      No family history on file.    Social History   Socioeconomic History  . Marital status: Single    Spouse name: Not on file  . Number of children: Not on file  . Years of education: Not on file  . Highest education level: Not on file  Occupational History  . Not on file  Tobacco Use  . Smoking status: Never Smoker  . Smokeless tobacco: Never Used  Vaping Use  . Vaping Use: Never used  Substance and Sexual Activity  . Alcohol use: No  . Drug use: No  . Sexual activity: Never  Other Topics Concern  . Not on file  Social History Narrative  . Not on file   Social Determinants of Health   Financial Resource Strain: Not on file  Food Insecurity: Not on file  Transportation Needs: Not on file  Physical Activity: Not on file  Stress: Not on file  Social Connections: Not on file    Allergies  Allergen Reactions  . Fentanyl Other (See Comments)    Dizziness  . Other Other (See Comments)    As per father allergic to one more medicine, but don't know the name.     Current Outpatient Medications:  .  acetaminophen (TYLENOL) 325 MG tablet, Take  400 mg by mouth every 6 (six) hours as needed for fever., Disp: , Rfl:  .  Catheters (ARGYLE SUCTION CATHETER 14FR) MISC, 1 packet by Does not apply route daily., Disp: 10 each, Rfl: 3 .  feeding supplement, ENSURE ENLIVE, (ENSURE ENLIVE) LIQD, You can use whatever supplement he likes.  He needs about 1300 calories per day. 1.5 liters of fluid and 50 grams of protein per day. You can buy this at the grocery or drug store.  You do not need a prescription. (Patient taking differently: Take 237 mLs by mouth daily as needed (protein intake).), Disp: 237 mL, Rfl:  12 .  guaiFENesin (ROBITUSSIN) 100 MG/5ML SOLN, Take 15 mLs (300 mg total) by mouth every 6 (six) hours. (Patient taking differently: Take 15 mLs by mouth daily as needed for cough.), Disp: 236 mL, Rfl: 0 .  ipratropium-albuterol (DUONEB) 0.5-2.5 (3) MG/3ML SOLN, Take 3 mLs by nebulization every 6 (six) hours as needed. (Patient taking differently: Take 3 mLs by nebulization every 6 (six) hours as needed (breathing / congestion).), Disp: 360 mL, Rfl: 0 .  magnesium oxide (MAG-OX) 400 MG tablet, Take 400 mg by mouth 3 (three) times daily., Disp: , Rfl:  .  metoprolol tartrate (LOPRESSOR) 25 MG tablet, Take 1 tablet (25 mg total) by mouth 2 (two) times daily., Disp: , Rfl:  .  Multiple Vitamin (MULTIVITAMIN WITH MINERALS) TABS tablet, Take 1 tablet by mouth daily., Disp: , Rfl:  .  ondansetron (ZOFRAN) 4 MG tablet, Take 1 tablet (4 mg total) by mouth every 6 (six) hours as needed for nausea. (Patient taking differently: Take 4 mg by mouth every 8 (eight) hours as needed for nausea.), Disp: 20 tablet, Rfl: 0 .  polyethylene glycol (MIRALAX / GLYCOLAX) packet, Follow package instruction for daily use.  You need to have one soft bowel movement per day.  If he is not doing this call your primary care doctor.  You also need to be sure he takes in 1.5 liters of fluid per day.   He needs protein supplement daily, and needs to take in about 1300 calories per day. (Patient taking differently: Take 17 g by mouth daily.), Disp: 14 each, Rfl: 0 .  scopolamine (TRANSDERM-SCOP) 1 MG/3DAYS, Place 2 patches (3 mg total) onto the skin every 3 (three) days., Disp: 10 patch, Rfl: 12 .  sterile water SOLN with amino acids 10 % SOLN 1.3 g/kg, dextrose 70 % SOLN 20 %, Inject 1,000 mLs into the vein continuous. TPN 1000 ml w/ lipids  Directions: Infuse TPN 105ml IV through PICC line via CADD SOLIS infusion pump daily over 18 hours with 1 hr ramp up and 1 hr ramp down. Add MVI 10 ml as directed to each bag prior to infusion. MIX  WELL VTBI: 1000 ML Bag Volume  1050 ml     Dosing Weight for the patient 72 lbs, Disp: , Rfl:     Observations/Objective:  Peter Hayes is doing well according to his Dad and home health RN from [place] with whom I spoke  Assessment and Plan:  LIne associated MS Coag negative staphylococcal bacteremia  --will send order to [facility] for set of surveillance BLOOD CULTURES FROM TWO PERIPHERAL SITES (NOT THE CENTRAL LINE)  Tpn dependence: would be more ideal if he could be fed enterally. I do not know if the chronic ileus is the obstacle. THe father mentioned riskiness of procedure perhaps due to MD  [facility] would be less risky but again would be ideal to  get away form parenteral nutrtion. Will defer to PCP and Radiology, Surgery    Follow Up Instructions:    I discussed the assessment and treatment plan with the patient. The patient was provided an opportunity to ask questions and all were answered. The patient agreed with the plan and demonstrated an understanding of the instructions.   The patient was advised to call back or seek an in-person evaluation if the symptoms worsen or if the condition fails to improve as anticipated.  I provided 21 minutes of non-face-to-face time during this encounter   Alcide Evener, MD

## 2020-11-30 ENCOUNTER — Telehealth: Payer: Self-pay

## 2020-11-30 NOTE — Telephone Encounter (Signed)
-----   Message from Truman Hayward, MD sent at 11/29/2020  5:03 PM EST ----- Can we send an order to [facility] to draw a set of TWO Peripheral blood cultures on Peter Hayes to be faxed to our office?

## 2020-11-30 NOTE — Telephone Encounter (Signed)
Received return call from [facility], patient is no on their case load. Call placed to Advance team and gave orders to [facility] to draw two peripheral blood cultures. Verbal order read back and understood. Verbal orders per Dr. Tommy Medal [facility]

## 2020-11-30 NOTE — Telephone Encounter (Signed)
Thx so much!

## 2020-11-30 NOTE — Telephone Encounter (Signed)
Call place to [facility] at [phone]. Spoke with [facility] call center. Awaiting a call from the office to relay orders. Will continue to follow [facility]

## 2020-12-02 NOTE — Telephone Encounter (Signed)
Attempted to call patient's father to relay that Dr. Tommy Medal is okay with the patient either coming to our office to have the blood cultures done, or they can choose to not have them done at all. No answer and unable to leave voicemail.   Beryle Flock, RN

## 2020-12-02 NOTE — Telephone Encounter (Signed)
Received call from [facility], they were unable to get a peripheral stick on the patient for blood cultures. Will route to provider.   Beryle Flock, RN

## 2020-12-06 NOTE — Telephone Encounter (Signed)
Left HIPAA compliant voicemail requesting callback.   Joyice Magda D Yvone Slape, RN  

## 2020-12-09 NOTE — Telephone Encounter (Signed)
We have tried to reach patient multiple times and left voicemail with no response. In previous notes you stated patient had the option to not do cultures at all. Is it ok to discontinue blood cultures due to no response from patient?

## 2020-12-09 NOTE — Telephone Encounter (Signed)
FYI- order has been discontinued per Dr. Tommy Medal

## 2020-12-09 NOTE — Telephone Encounter (Signed)
That's fine w me

## 2021-01-11 ENCOUNTER — Other Ambulatory Visit: Payer: Self-pay

## 2021-01-11 ENCOUNTER — Observation Stay (HOSPITAL_COMMUNITY)
Admission: EM | Admit: 2021-01-11 | Discharge: 2021-01-14 | Disposition: A | Discharge status: Home / Self Care | Payer: Managed Care, Other (non HMO) | Attending: Internal Medicine | Admitting: Internal Medicine

## 2021-01-11 ENCOUNTER — Encounter (HOSPITAL_COMMUNITY): Payer: Self-pay

## 2021-01-11 ENCOUNTER — Emergency Department (HOSPITAL_COMMUNITY): Payer: Managed Care, Other (non HMO)

## 2021-01-11 DIAGNOSIS — J939 Pneumothorax, unspecified: Secondary | ICD-10-CM | POA: Diagnosis not present

## 2021-01-11 DIAGNOSIS — Z789 Other specified health status: Secondary | ICD-10-CM | POA: Diagnosis present

## 2021-01-11 DIAGNOSIS — Z20822 Contact with and (suspected) exposure to covid-19: Secondary | ICD-10-CM | POA: Insufficient documentation

## 2021-01-11 DIAGNOSIS — R7989 Other specified abnormal findings of blood chemistry: Secondary | ICD-10-CM | POA: Diagnosis present

## 2021-01-11 DIAGNOSIS — E119 Type 2 diabetes mellitus without complications: Secondary | ICD-10-CM | POA: Insufficient documentation

## 2021-01-11 DIAGNOSIS — R945 Abnormal results of liver function studies: Secondary | ICD-10-CM | POA: Diagnosis present

## 2021-01-11 DIAGNOSIS — R0602 Shortness of breath: Secondary | ICD-10-CM | POA: Diagnosis present

## 2021-01-11 DIAGNOSIS — G7100 Muscular dystrophy, unspecified: Secondary | ICD-10-CM | POA: Diagnosis present

## 2021-01-11 LAB — CBC WITH DIFFERENTIAL/PLATELET
Abs Immature Granulocytes: 0.01 10*3/uL (ref 0.00–0.07)
Basophils Absolute: 0 10*3/uL (ref 0.0–0.1)
Basophils Relative: 0 %
Eosinophils Absolute: 0 10*3/uL (ref 0.0–0.5)
Eosinophils Relative: 1 %
HCT: 36.7 % — ABNORMAL LOW (ref 39.0–52.0)
Hemoglobin: 11.3 g/dL — ABNORMAL LOW (ref 13.0–17.0)
Immature Granulocytes: 0 %
Lymphocytes Relative: 18 %
Lymphs Abs: 1.2 10*3/uL (ref 0.7–4.0)
MCH: 25.8 pg — ABNORMAL LOW (ref 26.0–34.0)
MCHC: 30.8 g/dL (ref 30.0–36.0)
MCV: 83.8 fL (ref 80.0–100.0)
Monocytes Absolute: 0.8 10*3/uL (ref 0.1–1.0)
Monocytes Relative: 12 %
Neutro Abs: 4.6 10*3/uL (ref 1.7–7.7)
Neutrophils Relative %: 69 %
Platelets: 142 10*3/uL — ABNORMAL LOW (ref 150–400)
RBC: 4.38 MIL/uL (ref 4.22–5.81)
RDW: 15.3 % (ref 11.5–15.5)
WBC: 6.7 10*3/uL (ref 4.0–10.5)
nRBC: 0 % (ref 0.0–0.2)

## 2021-01-11 NOTE — ED Triage Notes (Signed)
Patient arrived with family who states patient has been complaining of shortness of breath over the last few days and upper back pain. No known sick contacts.

## 2021-01-12 ENCOUNTER — Observation Stay (HOSPITAL_COMMUNITY): Payer: Managed Care, Other (non HMO)

## 2021-01-12 ENCOUNTER — Emergency Department (HOSPITAL_COMMUNITY): Payer: Managed Care, Other (non HMO)

## 2021-01-12 ENCOUNTER — Encounter (HOSPITAL_COMMUNITY): Payer: Self-pay

## 2021-01-12 DIAGNOSIS — G7100 Muscular dystrophy, unspecified: Secondary | ICD-10-CM

## 2021-01-12 DIAGNOSIS — J939 Pneumothorax, unspecified: Secondary | ICD-10-CM | POA: Diagnosis not present

## 2021-01-12 DIAGNOSIS — Z789 Other specified health status: Secondary | ICD-10-CM | POA: Diagnosis not present

## 2021-01-12 DIAGNOSIS — R945 Abnormal results of liver function studies: Secondary | ICD-10-CM

## 2021-01-12 LAB — COMPREHENSIVE METABOLIC PANEL
ALT: 104 U/L — ABNORMAL HIGH (ref 0–44)
AST: 57 U/L — ABNORMAL HIGH (ref 15–41)
Albumin: 3.9 g/dL (ref 3.5–5.0)
Alkaline Phosphatase: 141 U/L — ABNORMAL HIGH (ref 38–126)
Anion gap: 8 (ref 5–15)
BUN: 15 mg/dL (ref 6–20)
CO2: 29 mmol/L (ref 22–32)
Calcium: 8.9 mg/dL (ref 8.9–10.3)
Chloride: 99 mmol/L (ref 98–111)
Creatinine, Ser: <0.3 mg/dL — ABNORMAL LOW (ref 0.61–1.24)
Glucose, Bld: 108 mg/dL — ABNORMAL HIGH (ref 70–99)
Potassium: 4.3 mmol/L (ref 3.5–5.1)
Sodium: 136 mmol/L (ref 135–145)
Total Bilirubin: 0.8 mg/dL (ref 0.3–1.2)
Total Protein: 7.7 g/dL (ref 6.5–8.1)

## 2021-01-12 LAB — SARS CORONAVIRUS 2 (TAT 6-24 HRS): SARS Coronavirus 2: NEGATIVE

## 2021-01-12 LAB — LACTIC ACID, PLASMA
Lactic Acid, Venous: 0.6 mmol/L (ref 0.5–1.9)
Lactic Acid, Venous: 0.9 mmol/L (ref 0.5–1.9)

## 2021-01-12 LAB — PROCALCITONIN: Procalcitonin: <0.1 ng/mL

## 2021-01-12 MED ORDER — ACETAMINOPHEN 325 MG PO TABS: 650.0000 mg | ORAL_TABLET | Freq: Four times a day (QID) | ORAL | Status: DC | PRN

## 2021-01-12 MED ORDER — SODIUM CHLORIDE 0.9% FLUSH
10.0000 mL | Freq: Two times a day (BID) | INTRAVENOUS | Status: DC
Start: 2021-01-12 — End: 2021-01-14
  Administered 2021-01-12 – 2021-01-13 (×4): 10 mL
  Administered 2021-01-13: 20 mL
  Administered 2021-01-14: 10 mL

## 2021-01-12 MED ORDER — CHLORHEXIDINE GLUCONATE CLOTH 2 % EX PADS
6.0000 | MEDICATED_PAD | Freq: Every day | CUTANEOUS | Status: DC
  Administered 2021-01-12 – 2021-01-13 (×3): 6 via TOPICAL

## 2021-01-12 MED ORDER — ACETAMINOPHEN 650 MG RE SUPP: 650.0000 mg | Freq: Four times a day (QID) | RECTAL | Status: DC | PRN

## 2021-01-12 MED ORDER — ENOXAPARIN SODIUM 300 MG/3ML IJ SOLN
20.0000 mg | Freq: Every day | INTRAMUSCULAR | Status: DC
  Administered 2021-01-12 – 2021-01-14 (×3): 20 mg via SUBCUTANEOUS
  Filled 2021-01-12 (×4): qty 0.2

## 2021-01-12 MED ORDER — SODIUM CHLORIDE 0.9 % IV SOLN
1.0000 g | Freq: Once | INTRAVENOUS | Status: AC
  Administered 2021-01-12: 1 g via INTRAVENOUS
  Filled 2021-01-12: qty 10

## 2021-01-12 MED ORDER — ALUM & MAG HYDROXIDE-SIMETH 200-200-20 MG/5ML PO SUSP: 15.0000 mL | Freq: Four times a day (QID) | ORAL | Status: DC | PRN

## 2021-01-12 MED ORDER — ONDANSETRON HCL 4 MG PO TABS: 4.0000 mg | ORAL_TABLET | Freq: Three times a day (TID) | ORAL | Status: DC | PRN

## 2021-01-12 MED ORDER — IOHEXOL 350 MG/ML SOLN
100.0000 mL | Freq: Once | INTRAVENOUS | Status: AC | PRN
  Administered 2021-01-12: 49 mL via INTRAVENOUS

## 2021-01-12 MED ORDER — SODIUM CHLORIDE 0.9 % IV SOLN
500.0000 mg | Freq: Once | INTRAVENOUS | Status: AC
  Administered 2021-01-12: 500 mg via INTRAVENOUS
  Filled 2021-01-12: qty 500

## 2021-01-12 MED ORDER — POLYETHYLENE GLYCOL 3350 17 G PO PACK: 17.0000 g | PACK | Freq: Every day | ORAL | Status: DC | PRN

## 2021-01-12 MED ORDER — SODIUM CHLORIDE 0.9% FLUSH: 10.0000 mL | INTRAVENOUS | Status: DC | PRN

## 2021-01-12 MED ORDER — SCOPOLAMINE 1 MG/3DAYS TD PT72
2.0000 | MEDICATED_PATCH | TRANSDERMAL | Status: DC
  Administered 2021-01-13: 3 mg via TRANSDERMAL
  Filled 2021-01-12: qty 2

## 2021-01-12 MED ORDER — IPRATROPIUM-ALBUTEROL 0.5-2.5 (3) MG/3ML IN SOLN: 3.0000 mL | Freq: Four times a day (QID) | RESPIRATORY_TRACT | Status: DC | PRN

## 2021-01-12 MED ORDER — ONDANSETRON HCL 4 MG/2ML IJ SOLN
4.0000 mg | Freq: Four times a day (QID) | INTRAMUSCULAR | Status: DC | PRN
  Administered 2021-01-12 – 2021-01-13 (×2): 4 mg via INTRAVENOUS
  Filled 2021-01-12 (×2): qty 2

## 2021-01-12 NOTE — Consult Note (Signed)
NAME:  Peter Hayes, MRN:  [id number], DOB:  09-May-1996, LOS: 0 ADMISSION DATE:  01/11/2021, CONSULTATION DATE:  01/12/2021 REFERRING MD:  Dr Dairl Ponder, CHIEF COMPLAINT:  SOB, chest pain   History of Present Illness:  History of muscular dystrophy, patient is bedbound with chronic hypoxic and hypercapnic respiratory failure. -Has been on ventilator since 2009 -Currently using AVAPS around-the-clock  Presented to the ED with shortness of breath, back pain and discomfort especially on the left side for 2 to 4 days No fevers or chills  Pain and discomfort is presently better  Pertinent  Medical History   Past Medical History:  Diagnosis Date  . Acute respiratory failure with hypoxia and hypercapnia (HCC) 05/16/2018  . HCAP (healthcare-associated pneumonia) 12/01/2017  . High anion gap metabolic acidosis 05/05/2016  . Hypotension   . Ileus (HCC) 09/23/2017  . Leukocytosis 05/05/2016  . MD (muscular dystrophy) (HCC)   . Refusal of blood product    patient is Luan Moore witness  . Respiratory alkalosis 05/05/2016  . Sepsis (HCC) 05/05/2016  . Severe sepsis (HCC) 09/16/2019  . Sinus tachycardia 05/05/2016   Significant Hospital Events: Including procedures, antibiotic start and stop dates in addition to other pertinent events   . On AVAPS  Interim History / Subjective:  Patient continues on his machine Pain and discomfort is better  Objective   Blood pressure 99/71, pulse 82, temperature 98.5 F (36.9 C), temperature source Axillary, resp. rate (!) 22, weight 35.4 kg, SpO2 96 %.        Intake/Output Summary (Last 24 hours) at 01/12/2021 0912 Last data filed at 01/12/2021 5431 Gross per 24 hour  Intake 109.9 ml  Output --  Net 109.9 ml   Filed Weights   01/12/21 0852  Weight: 35.4 kg    Examination: General: Young gentleman, does not appear to be in distress, CPAP mask in place HENT: Mask in place Lungs: Decreased air movement bilaterally Cardiovascular: S1-S2 appreciated Abdomen:  Soft, bowel sounds appreciated Extremities:  Neuro: Awake and alert, interactive GU:   Labs/imaging that I havepersonally reviewed  (right click and "Reselect all SmartList Selections" daily)  CT chest personally reviewed, compared with CT performed 08/21/2020 -Small left apical pneumothorax -Right basal pneumothorax is new  Blood work-CBC within normal limits, except Chem-7 within normal limits   Resolved Hospital Problem list     Assessment & Plan:  Bilateral pneumothoraces -Both pneumothoraces are small -Pneumothorax on the left has been there for months -Pneumothorax on the right is new, basal, associated hydropneumothorax-this may have been present for a while as well  Patient is on AVAPS around-the-clock On positive pressure, location of pneumothoraxes makes it difficult to access with respect to placing the chest tube, pneumothorax is very small.  I will recommend to observe for 24 hours Repeat chest x-ray in a.m. If no significant pneumothorax on chest x-ray, no significant worsening of shortness of breath or any symptoms suggestive of worsening -May be safely discharged -To follow-up with primary/pulmonologist that has been managing his positive pressure ventilation -Pressures may need adjusted  I did spend 20 minutes talking with [facility]-awaiting a callback with respect to his usual pressures  No other changes need made at present -May continue on his AVAPS machine  I will send procalcitonin I do not believe he needs to be on antibiotics at present    Labs   CBC: Recent Labs  Lab 01/11/21 2315  WBC 6.7  NEUTROABS 4.6  HGB 11.3*  HCT 36.7*  MCV 83.8  PLT 142*    Basic Metabolic Panel: Recent Labs  Lab 01/12/21 0124  NA 136  K 4.3  CL 99  CO2 29  GLUCOSE 108*  BUN 15  CREATININE <0.30*  CALCIUM 8.9   GFR: CrCl cannot be calculated (This lab value cannot be used to calculate CrCl because it is not a number: <0.30). Recent Labs  Lab  01/11/21 2315 01/12/21 0124  WBC 6.7  --   LATICACIDVEN 0.6 0.9    Liver Function Tests: Recent Labs  Lab 01/12/21 0124  AST 57*  ALT 104*  ALKPHOS 141*  BILITOT 0.8  PROT 7.7  ALBUMIN 3.9   No results for input(s): LIPASE, AMYLASE in the last 168 hours. No results for input(s): AMMONIA in the last 168 hours.  ABG    Component Value Date/Time   PHART 7.419 09/02/2018 0540   PCO2ART 58.7 (H) 09/02/2018 0540   PO2ART 150 (H) 09/02/2018 0540   HCO3 38.5 (H) 09/15/2019 1903   TCO2 41 (H) 09/15/2019 1903   ACIDBASEDEF 16.0 (H) 05/05/2016 0316   O2SAT 37.0 09/15/2019 1903     Coagulation Profile: No results for input(s): INR, PROTIME in the last 168 hours.  Cardiac Enzymes: No results for input(s): CKTOTAL, CKMB, CKMBINDEX, TROPONINI in the last 168 hours.  HbA1C: Hgb A1c MFr Bld  Date/Time Value Ref Range Status  10/29/2020 02:49 AM 4.6 (L) 4.8 - 5.6 % Final    Comment:    (NOTE) Pre diabetes:          5.7%-6.4%  Diabetes:              >6.4%  Glycemic control for   <7.0% adults with diabetes     CBG: No results for input(s): GLUCAP in the last 168 hours.  Review of Systems:   Symptoms generally at baseline, shortness of breath, chest pain is better  Past Medical History:  He,  has a past medical history of Acute respiratory failure with hypoxia and hypercapnia ([place]) (05/16/2018), HCAP (healthcare-associated pneumonia) (12/01/2017), High anion gap metabolic acidosis (1/00/7121), Hypotension, Ileus ([place]) (09/23/2017), Leukocytosis (05/05/2016), MD (muscular dystrophy) ([place]), Refusal of blood product, Respiratory alkalosis (05/05/2016), Sepsis ([place]) (05/05/2016), Severe sepsis ([place]) (09/16/2019), and Sinus tachycardia (05/05/2016).   Surgical History:   Past Surgical History:  Procedure Laterality Date  . COLOSTOMY    . EYE SURGERY    . FLEXIBLE SIGMOIDOSCOPY N/A 01/10/2018   Procedure: FLEXIBLE SIGMOIDOSCOPY;  Surgeon: Laurence Spates, MD;  Location: [facility] ENDOSCOPY;   Service: Endoscopy;  Laterality: N/A;  . SMALL INTESTINE SURGERY       Social History:   reports that he has never smoked. He has never used smokeless tobacco. He reports that he does not drink alcohol and does not use drugs.   Family History:  His family history is not on file.   Allergies Allergies  Allergen Reactions  . Fentanyl Other (See Comments)    Dizziness  . Other Other (See Comments)    As per father allergic to one more medicine, but don't know the name.     Home Medications  Prior to Admission medications   Medication Sig Start Date End Date Taking? Authorizing Provider  ipratropium-albuterol (DUONEB) 0.5-2.5 (3) MG/3ML SOLN Take 3 mLs by nebulization every 6 (six) hours as needed. Patient taking differently: Take 3 mLs by nebulization every 6 (six) hours as needed (breathing / congestion). 09/21/19  Yes Harold Hedge, MD  metoprolol tartrate (LOPRESSOR) 25 MG tablet Take 1 tablet (25 mg  total) by mouth 2 (two) times daily. 11/01/20  Yes Swayze, Ava, DO  Multiple Vitamin (MULTIVITAMIN WITH MINERALS) TABS tablet Take 1 tablet by mouth daily.   Yes [provider]  ondansetron (ZOFRAN) 4 MG tablet Take 1 tablet (4 mg total) by mouth every 6 (six) hours as needed for nausea. Patient taking differently: Take 4 mg by mouth every 8 (eight) hours as needed for nausea. 06/21/20  Yes Nita Sells, MD  polyethylene glycol (MIRALAX / Floria Raveling) packet Follow package instruction for daily use.  You need to have one soft bowel movement per day.  If he is not doing this call your primary care doctor.  You also need to be sure he takes in 1.5 liters of fluid per day.   He needs protein supplement daily, and needs to take in about 1300 calories per day. Patient taking differently: Take 17 g by mouth daily as needed for moderate constipation. 01/15/18  Yes Earnstine Regal, PA-C  scopolamine (TRANSDERM-SCOP) 1 MG/3DAYS Place 2 patches (3 mg total) onto the skin every 3  (three) days. 11/02/20  Yes Swayze, Ava, DO  sterile water SOLN with amino acids 10 % SOLN 1.3 g/kg, dextrose 70 % SOLN 20 % Inject 1,000 mLs into the vein continuous. TPN 1000 ml w/ lipids  Directions: Infuse TPN 1059ml IV through PICC line via CADD SOLIS infusion pump daily over 18 hours with 1 hr ramp up and 1 hr ramp down. Add MVI 10 ml as directed to each bag prior to infusion. MIX WELL VTBI: 1000 ML Bag Volume  1050 ml     Dosing Weight for the patient 72 lbs   Yes [provider]  Catheters (ARGYLE SUCTION CATHETER 14FR) MISC 1 packet by Does not apply route daily. 08/27/20   Charlynne Cousins, MD  feeding supplement, ENSURE ENLIVE, (ENSURE ENLIVE) LIQD You can use whatever supplement he likes.  He needs about 1300 calories per day. 1.5 liters of fluid and 50 grams of protein per day. You can buy this at the grocery or drug store.  You do not need a prescription. Patient not taking: No sig reported 06/21/20   Nita Sells, MD  guaiFENesin (ROBITUSSIN) 100 MG/5ML SOLN Take 15 mLs (300 mg total) by mouth every 6 (six) hours. Patient not taking: No sig reported 06/21/20   Nita Sells, MD    Sherrilyn Rist, MD [facility] Pager: [phone]

## 2021-01-12 NOTE — ED Notes (Signed)
Called phlebotomy to collect blood cultures. Per IV team, they do not start IV's for lab draws

## 2021-01-12 NOTE — ED Provider Notes (Addendum)
Signout note  25 year old with history of muscular dystrophy, constant CPAP use presents to ER with concern for shortness of breath pain.  CTA chest ordered to further investigate.  12:00 AM Received signout from Dr. Kathrynn Humble, follow-up on CT results  4:43 AM reviewed results with radiologist, left-sided pneumothorax stable from prior, right side however is new; will consult pulmonology for further recommendations  5:42 AM discussed case with Dr. Mariane Masters with pulmonology, he recommends hospitalist admission for observation, antibiotics, formal pulmonology consult in the morning, requests hospitalist resubmit consult sometime after 7am; will consult [place] for admit    Lucrezia Starch, MD 01/12/21 0444    Lucrezia Starch, MD 01/12/21 [phone]

## 2021-01-12 NOTE — ED Provider Notes (Addendum)
[facility] DEPT Provider Note   CSN: [id number] Arrival date & time: 01/11/21  1905     History Chief Complaint  Patient presents with  . Shortness of Breath    Peter Hayes is a 25 y.o. male.  HPI    25 year old comes in a chief complaint of shortness of breath and left-sided back pain.  He has history of muscular dystrophy and has had pneumonia in the past.  Patient is also on CPAP constantly now.  Pain has been present for the last 2 to 4 days.  Patient denies any cough.  There is history of aspiration pneumonia.  No history of PE, patient is bedbound.  Past Medical History:  Diagnosis Date  . Acute respiratory failure with hypoxia and hypercapnia ([place]) 05/16/2018  . HCAP (healthcare-associated pneumonia) 12/01/2017  . High anion gap metabolic acidosis 0/31/5945  . Hypotension   . Ileus ([place]) 09/23/2017  . Leukocytosis 05/05/2016  . MD (muscular dystrophy) ([place])   . Refusal of blood product    patient is Peter Hayes witness  . Respiratory alkalosis 05/05/2016  . Sepsis ([place]) 05/05/2016  . Severe sepsis ([place]) 09/16/2019  . Sinus tachycardia 05/05/2016    Patient Active Problem List   Diagnosis Date Noted  . Pneumothorax on right 01/12/2021  . Pneumothorax 01/12/2021  . Staphylococcus aureus bacteremia 10/27/2020  . On total parenteral nutrition (TPN) 10/26/2020  . Bacteremia due to coagulase-negative Staphylococcus 10/26/2020  . Coag negative Staphylococcus bacteremia 08/21/2020  . Abnormal LFTs 08/21/2020  . Acute metabolic encephalopathy 85/92/9244  . Pneumothorax on left 08/21/2020  . Sepsis ([place]) 06/14/2020  . Pneumomediastinum ([place]) 02/06/2020  . Pressure injury of skin 09/17/2019  . 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 Hypoglycemia without diagnosis of diabetes mellitus 09/24/2017  . Muscular dystrophy ([place]) 05/05/2016  . Moderate protein-calorie malnutrition ([place]) 05/05/2016    Past Surgical History:   Procedure Laterality Date  . COLOSTOMY    . EYE SURGERY    . FLEXIBLE SIGMOIDOSCOPY N/A 01/10/2018   Procedure: FLEXIBLE SIGMOIDOSCOPY;  Surgeon: Laurence Spates, MD;  Location: [facility] ENDOSCOPY;  Service: Endoscopy;  Laterality: N/A;  . SMALL INTESTINE SURGERY         No family history on file.  Social History   Tobacco Use  . Smoking status: Never Smoker  . Smokeless tobacco: Never Used  Vaping Use  . Vaping Use: Never used  Substance Use Topics  . Alcohol use: No  . Drug use: No    Home Medications Prior to Admission medications   Medication Sig Start Date End Date Taking? Authorizing Provider  ipratropium-albuterol (DUONEB) 0.5-2.5 (3) MG/3ML SOLN Take 3 mLs by nebulization every 6 (six) hours as needed. Patient taking differently: Take 3 mLs by nebulization every 6 (six) hours as needed (breathing / congestion). 09/21/19  Yes Harold Hedge, MD  metoprolol tartrate (LOPRESSOR) 25 MG tablet Take 1 tablet (25 mg total) by mouth 2 (two) times daily. 11/01/20  Yes Swayze, Ava, DO  Multiple Vitamin (MULTIVITAMIN WITH MINERALS) TABS tablet Take 1 tablet by mouth daily.   Yes [provider]  ondansetron (ZOFRAN) 4 MG tablet Take 1 tablet (4 mg total) by mouth every 6 (six) hours as needed for nausea. Patient taking differently: Take 4 mg by mouth every 8 (eight) hours as needed for nausea. 06/21/20  Yes Nita Sells, MD  polyethylene glycol (MIRALAX / Floria Raveling) packet Follow package instruction for daily use.  You need to have one  soft bowel movement per day.  If he is not doing this call your primary care doctor.  You also need to be sure he takes in 1.5 liters of fluid per day.   He needs protein supplement daily, and needs to take in about 1300 calories per day. Patient taking differently: Take 17 g by mouth daily as needed for moderate constipation. 01/15/18  Yes Earnstine Regal, PA-C  scopolamine (TRANSDERM-SCOP) 1 MG/3DAYS Place 2 patches (3 mg total) onto the skin  every 3 (three) days. 11/02/20  Yes Swayze, Ava, DO  sterile water SOLN with amino acids 10 % SOLN 1.3 g/kg, dextrose 70 % SOLN 20 % Inject 1,000 mLs into the vein continuous. TPN 1000 ml w/ lipids  Directions: Infuse TPN 1094ml IV through PICC line via CADD SOLIS infusion pump daily over 18 hours with 1 hr ramp up and 1 hr ramp down. Add MVI 10 ml as directed to each bag prior to infusion. MIX WELL VTBI: 1000 ML Bag Volume  1050 ml     Dosing Weight for the patient 72 lbs   Yes [provider]  Catheters (ARGYLE SUCTION CATHETER 14FR) MISC 1 packet by Does not apply route daily. 08/27/20   Charlynne Cousins, MD  feeding supplement, ENSURE ENLIVE, (ENSURE ENLIVE) LIQD You can use whatever supplement he likes.  He needs about 1300 calories per day. 1.5 liters of fluid and 50 grams of protein per day. You can buy this at the grocery or drug store.  You do not need a prescription. Patient not taking: No sig reported 06/21/20   Nita Sells, MD  guaiFENesin (ROBITUSSIN) 100 MG/5ML SOLN Take 15 mLs (300 mg total) by mouth every 6 (six) hours. Patient not taking: No sig reported 06/21/20   Nita Sells, MD    Allergies    Fentanyl and Other  Review of Systems   Review of Systems  Constitutional: Positive for activity change.  Respiratory: Positive for shortness of breath.   Cardiovascular: Positive for chest pain.  Gastrointestinal: Negative for nausea and vomiting.  Musculoskeletal: Positive for back pain.  All other systems reviewed and are negative.   Physical Exam Updated Vital Signs BP 100/68   Pulse 83   Temp 98.5 F (36.9 C) (Axillary)   Resp 19   Wt 35.4 kg   SpO2 96%   BMI 12.98 kg/m   Physical Exam Vitals and nursing note reviewed.  Constitutional:      Appearance: He is well-developed.  HENT:     Head: Atraumatic.  Cardiovascular:     Rate and Rhythm: Tachycardia present.  Pulmonary:     Effort: Pulmonary effort is normal.     Breath sounds:  No decreased breath sounds, wheezing, rhonchi or rales.  Musculoskeletal:     Cervical back: Neck supple.     Right lower leg: No edema.     Left lower leg: No edema.  Skin:    General: Skin is warm.  Neurological:     Mental Status: He is alert and oriented to person, place, and time.     ED Results / Procedures / Treatments   Labs (all labs ordered are listed, but only abnormal results are displayed) Labs Reviewed  CBC WITH DIFFERENTIAL/PLATELET - Abnormal; Notable for the following components:      Result Value   Hemoglobin 11.3 (*)    HCT 36.7 (*)    MCH 25.8 (*)    Platelets 142 (*)    All other components within  normal limits  COMPREHENSIVE METABOLIC PANEL - Abnormal; Notable for the following components:   Glucose, Bld 108 (*)    Creatinine, Ser <0.30 (*)    AST 57 (*)    ALT 104 (*)    Alkaline Phosphatase 141 (*)    All other components within normal limits  CULTURE, BLOOD (ROUTINE X 2)  CULTURE, BLOOD (ROUTINE X 2)  SARS CORONAVIRUS 2 (TAT 6-24 HRS)  LACTIC ACID, PLASMA  LACTIC ACID, PLASMA  PROCALCITONIN    EKG EKG Interpretation  Date/Time:  Wednesday January 11 2021 19:48:30 EDT Ventricular Rate:  122 PR Interval:  94 QRS Duration: 88 QT Interval:  309 QTC Calculation: 441 R Axis:   138 Text Interpretation: Sinus tachycardia Probable left atrial enlargement Probable right ventricular hypertrophy Nonspecific T abnormalities, lateral leads Nonspecific ST and T wave abnormality No significant change since last tracing Confirmed by Varney Biles ([id number]) on 01/11/2021 9:47:12 PM   Radiology CT Angio Chest PE W and/or Wo Contrast  Addendum Date: 01/12/2021   ADDENDUM REPORT: 01/12/2021 04:52 ADDENDUM: Study discussed by telephone with Dr. Roslynn Amble in the ED on 01/12/2021 at 0438 hours. And he called my attention to a more recent comparison chest CT 08/21/2020. The small left-side pneumothorax, and much of the left lung surface reticulonodular changes, are stable  since November. The small right pneumothorax is new since that time, where as the small component of right pleural effusion has regressed since November. Right lung surface reticulonodular changes have mildly increased since November, while right lower lobe peribronchial ground-glass opacity has resolved. CONCLUSION: 1. Stable left lung since November, with persistent small left pneumothorax. 2. New small right pneumothorax, but regressed right pleural effusion and right lower lobe bronchopneumonia since November. Electronically Signed   By: Genevie Ann M.D.   On: 01/12/2021 04:52   Result Date: 01/12/2021 CLINICAL DATA:  25 year old male with muscular dystrophy. Respiratory failure, shortness of breath. EXAM: CT ANGIOGRAPHY CHEST WITH CONTRAST TECHNIQUE: Multidetector CT imaging of the chest was performed using the standard protocol during bolus administration of intravenous contrast. Multiplanar CT image reconstructions and MIPs were obtained to evaluate the vascular anatomy. CONTRAST:  38mL OMNIPAQUE IOHEXOL 350 MG/ML SOLN COMPARISON:  Portable chest 2359 hours.  Chest CTA 12/01/2017. FINDINGS: Cardiovascular: Excellent contrast bolus timing in the pulmonary arterial tree. No focal filling defect identified in the pulmonary arteries to suggest acute pulmonary embolism. Right side PICC line. No cardiomegaly or pericardial effusion. Negative visible aorta. 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 No mediastinal lymphadenopathy. Hilar lymph node tissue not significantly changed. Lungs/Pleura: Small right hydropneumothorax, with pleural gas most pronounced at the anterior costophrenic angle (series 7, image 100). Small volume dependent low-density pleural fluid. Reticulonodular changes along the anterior surface of all right lung lobes, perhaps atelectasis but infection not excluded. Similar peripheral reticulonodular changes in the left lung also with trace pneumothorax (left lung apex  series 7, image 15). Small peripheral subpleural 11 mm opacity in the lateral basal segment of the left lower lobe, new since 2019. No left pleural effusion. No consolidation. Major airways are patent. Upper Abdomen: No pneumoperitoneum in the visible upper abdomen. Visible liver, spleen, pancreas, adrenal glands, kidneys and bowel appear within normal limits. Musculoskeletal: Osteopenia with levoconvex scoliosis. No rib fracture identified. No acute osseous abnormality identified. Review of the MIP images confirms the above findings. IMPRESSION: 1. Negative for acute pulmonary embolus, but positive for Small Bilateral Pneumothoraces - greater on the right with small hydropneumothorax on that side. These  might not be suitable for chest tube placement. 2. Superimposed nonspecific reticulonodular thickening along the surfaces of both lungs - possibly atelectasis but lung infection not excluded. Small nonspecific subpleural opacity also in the left lower lobe. No left pleural fluid. 3. Osteopenia.  No acute osseous abnormality identified. 4. Negative visible upper abdominal viscera. Electronically Signed: By: Genevie Ann M.D. On: 01/12/2021 04:37   DG CHEST PORT 1 VIEW  Result Date: 01/12/2021 CLINICAL DATA:  Chronic hypoxic and hypercapnic respiratory failure. EXAM: PORTABLE CHEST 1 VIEW COMPARISON:  Chest x-ray 01/11/2021 and chest CT 01/12/2021 FINDINGS: The cardiac silhouette, mediastinal and hilar contours are within normal limits and stable. The PICC line is stable. Chronic streaky interstitial scarring changes mainly in the upper lobes bilaterally. No acute superimposed pulmonary process is identified. Small left apical pneumothorax is stable. The right-sided pneumothorax seen on the chest CT is difficult to see on this chest x-ray. Gaseous distention of the bowel suggesting a diffuse ileus. IMPRESSION: 1. Stable small left apical pneumothorax. The right-sided pneumothorax seen on the chest CT is not well  demonstrated on this chest x-ray. 2. Chronic streaky interstitial scarring changes mainly in the upper lobes. Electronically Signed   By: Marijo Sanes M.D.   On: 01/12/2021 13:46   DG Chest Port 1 View  Result Date: 01/12/2021 CLINICAL DATA:  Left back pain.  Shortness of breath EXAM: PORTABLE CHEST 1 VIEW COMPARISON:  10/26/2020 FINDINGS: Right PICC line in place with the tip in the right atrium. Heart is normal size. Linear scarring in the right upper lobe. No acute confluent opacities or effusions. No acute bony abnormality. IMPRESSION: No acute cardiopulmonary disease. Electronically Signed   By: Rolm Baptise M.D.   On: 01/12/2021 00:06   US Abdomen Limited RUQ (LIVER/GB)  Result Date: 01/12/2021 CLINICAL DATA:  25 year old male with abnormal increased LFTs. Muscular dystrophy. EXAM: ULTRASOUND ABDOMEN LIMITED RIGHT UPPER QUADRANT COMPARISON:  CT Abdomen and Pelvis 06/15/2020. Ultrasound 08/21/2020 FINDINGS: Gallbladder: No gallstones or wall thickening visualized. No sonographic Murphy sign noted by sonographer. Common bile duct: Diameter: 3 mm, normal. Liver: Diminutive left hepatic lobe as demonstrated by CT. Visible liver echogenicity within normal limits (image 24). No discrete liver lesion. No intrahepatic biliary ductal dilatation identified. Portal vein is patent on color Doppler imaging with normal direction of blood flow towards the liver. Other: Negative visible right kidney. IMPRESSION: Negative right upper quadrant ultrasound. Electronically Signed   By: Genevie Ann M.D.   On: 01/12/2021 09:55    Procedures .Critical Care Performed by: Varney Biles, MD Authorized by: Varney Biles, MD   Critical care provider statement:    Critical care time (minutes):  36   Critical care was necessary to treat or prevent imminent or life-threatening deterioration of the following conditions:  Respiratory failure   Critical care was time spent personally by me on the following activities:   Discussions with consultants, evaluation of patient's response to treatment, examination of patient, ordering and performing treatments and interventions, ordering and review of laboratory studies, ordering and review of radiographic studies, pulse oximetry, re-evaluation of patient's condition, obtaining history from patient or surrogate and review of old charts     Medications Ordered in ED Medications  sodium chloride flush (NS) 0.9 % injection 10-40 mL (10 mLs Intracatheter Given 01/12/21 1011)  sodium chloride flush (NS) 0.9 % injection 10-40 mL (has no administration in time range)  Chlorhexidine Gluconate Cloth 2 % PADS 6 each (0 each Topical Hold 01/12/21 1011)  ondansetron (  ZOFRAN) injection 4 mg (4 mg Intravenous Given 01/12/21 0845)  ondansetron (ZOFRAN) tablet 4 mg (has no administration in time range)  scopolamine (TRANSDERM-SCOP) 1 MG/3DAYS 3 mg (has no administration in time range)  ipratropium-albuterol (DUONEB) 0.5-2.5 (3) MG/3ML nebulizer solution 3 mL (has no administration in time range)  acetaminophen (TYLENOL) tablet 650 mg (has no administration in time range)    Or  acetaminophen (TYLENOL) suppository 650 mg (has no administration in time range)  polyethylene glycol (MIRALAX / GLYCOLAX) packet 17 g (has no administration in time range)  enoxaparin (LOVENOX) 100 mg/mL injection 20 mg (20 mg Subcutaneous Given 01/12/21 1129)  alum & mag hydroxide-simeth (MAALOX/MYLANTA) 200-200-20 MG/5ML suspension 15 mL (has no administration in time range)  iohexol (OMNIPAQUE) 350 MG/ML injection 100 mL (49 mLs Intravenous Contrast Given 01/12/21 0349)  cefTRIAXone (ROCEPHIN) 1 g in sodium chloride 0.9 % 100 mL IVPB (0 g Intravenous Stopped 01/12/21 0639)  azithromycin (ZITHROMAX) 500 mg in sodium chloride 0.9 % 250 mL IVPB (0 mg Intravenous Stopped 01/12/21 0849)    ED Course  I have reviewed the triage vital signs and the nursing notes.  Pertinent labs & imaging results that were available  during my care of the patient were reviewed by me and considered in my medical decision making (see chart for details).    MDM Rules/Calculators/A&P                          25 year old male with history of muscular dystrophy who is on constant CPAP comes in a chief complaint of left-sided back pain in the thoracic region along with shortness of breath.  During my assessment he denies any pleuritic component to the pain.  He has has history of pneumonia, however lung exam is reassuring.  X-rays ordered and they do not reveal any clear evidence of pneumonia.  White count is normal.  Patient is getting CT angiogram to rule out PE.  If the CT scan is negative he can be discharged.  Family and the patient has been made aware of the plan.  Dr. Roslynn Amble to follow-up  Final Clinical Impression(s) / ED Diagnoses Final diagnoses:  Pneumothorax, unspecified type    Rx / DC Orders ED Discharge Orders    None       Varney Biles, MD 01/12/21 0041    Varney Biles, MD 01/12/21 [phone]

## 2021-01-12 NOTE — ED Notes (Signed)
Patient transported to CT 

## 2021-01-12 NOTE — H&P (Signed)
History and Physical        Hospital Admission Note Date: 01/12/2021  Patient name: Peter Hayes Medical record number: [id number] Date of birth: January 25, 1996 Age: 25 y.o. Gender: male  PCP: Peter Bombard, PA    Chief Complaint    Chief Complaint  Patient presents with  . 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  Says this is different from his normal pneumonia symptoms.  Father states there has not been any change in ostomy output but patient has had increased flatus.  Denies any fevers.  No trauma.  Otherwise no complaints   ED Course: Afebrile, tachycardic, tachypneic,  on room air, soft BP. Notable Labs: Alk phos 141, AST 57, ALT 104, WBC 6.7, Hb 11.3, platelets 142, COVID-19 pending and otherwise labs unremarkable. Notable Imaging: CTA chest-new small right pneumothorax but regressed right pleural effusion and right lower lobe bronchopneumonia since November and stable left lung since November with persistent small left pneumothorax.  ED provider discussed with pulmonology who recommended hospitalist admission and formal pulmonology consult in a.m.  Patient received ceftriaxone and azithromycin.    Vitals:   01/12/21 0530 01/12/21 0600  BP: 98/66 99/71  Pulse: 83 82  Resp: 16 (!) 22  Temp:    SpO2: 98% 96%     Review of Systems:  Review of Systems  All other systems reviewed and are negative.   Medical/Social/Family History    Past Medical History: Past Medical History:  Diagnosis Date  . Acute respiratory failure with hypoxia and hypercapnia ([place]) 05/16/2018  . HCAP (healthcare-associated pneumonia) 12/01/2017  . High anion gap metabolic acidosis 9/93/5701  . Hypotension   . Ileus ([place]) 09/23/2017  . Leukocytosis 05/05/2016  . MD (muscular dystrophy) ([place])   . Refusal of blood product    patient is Peter Hayes witness  . Respiratory alkalosis 05/05/2016  . Sepsis ([place]) 05/05/2016  . Severe sepsis ([place]) 09/16/2019  . Sinus tachycardia 05/05/2016    Past Surgical History:  Procedure Laterality Date  . COLOSTOMY    . EYE SURGERY    . FLEXIBLE SIGMOIDOSCOPY N/A 01/10/2018   Procedure: FLEXIBLE SIGMOIDOSCOPY;  Surgeon: Peter Spates, MD;  Location: [facility];  Service: Endoscopy;  Laterality: N/A;  . SMALL INTESTINE SURGERY      Medications: Prior to Admission medications   Medication Sig Start Date End Date Taking? Authorizing Provider  ipratropium-albuterol (DUONEB) 0.5-2.5 (3) MG/3ML SOLN Take 3 mLs by nebulization every 6 (six) hours as needed. Patient taking differently: Take 3 mLs by nebulization every 6 (six) hours as needed (breathing / congestion). 09/21/19  Yes Peter Hedge, MD  metoprolol tartrate (LOPRESSOR) 25 MG tablet Take 1 tablet (25 mg total) by mouth 2 (two) times daily. 11/01/20  Yes Hayes, Ava, DO  Multiple Vitamin (MULTIVITAMIN WITH  MINERALS) TABS tablet Take 1 tablet by mouth daily.   Yes [provider]  ondansetron (ZOFRAN) 4 MG tablet Take 1 tablet (4 mg total) by mouth every 6 (six) hours as needed for nausea. Patient taking differently: Take 4 mg by mouth every 8 (eight) hours as needed for nausea. 06/21/20  Yes Peter Sells, MD  polyethylene glycol (MIRALAX / Peter Hayes) packet Follow package instruction for daily use.  You need to have one soft bowel movement per day.  If he is not doing this call your primary care doctor.  You also need to be sure he takes in 1.5  liters of fluid per day.   He needs protein supplement daily, and needs to take in about 1300 calories per day. Patient taking differently: Take 17 g by mouth daily as needed for moderate constipation. 01/15/18  Yes Peter Regal, PA-C  scopolamine (TRANSDERM-SCOP) 1 MG/3DAYS Place 2 patches (3 mg total) onto the skin every 3 (three) days. 11/02/20  Yes Hayes, Ava, DO  sterile water SOLN with amino acids 10 % SOLN 1.3 g/kg, dextrose 70 % SOLN 20 % Inject 1,000 mLs into the vein continuous. TPN 1000 ml w/ lipids  Directions: Infuse TPN 1084m IV through PICC line via CADD SOLIS infusion pump daily over 18 hours with 1 hr ramp up and 1 hr ramp down. Add MVI 10 ml as directed to each bag prior to infusion. MIX WELL VTBI: 1000 ML Bag Volume  1050 ml     Dosing Weight for the patient 72 lbs   Yes [provider]  Catheters (ARGYLE SUCTION CATHETER 14FR) MISC 1 packet by Does not apply route daily. 08/27/20   FCharlynne Cousins MD  feeding supplement, ENSURE ENLIVE, (ENSURE ENLIVE) LIQD You can use whatever supplement he likes.  He needs about 1300 calories per day. 1.5 liters of fluid and 50 grams of protein per day. You can buy this at the grocery or drug store.  You do not need a prescription. Patient not taking: No sig reported 06/21/20   SNita Sells MD  guaiFENesin (ROBITUSSIN) 100 MG/5ML SOLN Take 15 mLs (300 mg total) by mouth every 6 (six) hours. Patient not taking: No sig reported 06/21/20   SNita Sells MD    Allergies:   Allergies  Allergen Reactions  . Fentanyl Other (See Comments)    Dizziness  . Other Other (See Comments)    As per father allergic to one more medicine, but don't know the name.    Social History:  reports that he has never smoked. He has never used smokeless tobacco. He reports that he does not drink alcohol and does not use drugs.  Family History: No family history on file.   Objective   Physical Exam: Blood pressure 99/71,  pulse 82, temperature 98.5 F (36.9 C), temperature source Axillary, resp. rate (!) 22, SpO2 96 %.  Physical Exam Vitals and nursing note reviewed.  Constitutional:      General: He is not in acute distress. HENT:     Head: Normocephalic.  Cardiovascular:     Rate and Rhythm: Normal rate and regular rhythm.  Pulmonary:     Effort: Pulmonary effort is normal. No tachypnea.     Breath sounds: No wheezing.     Comments: CPAP in place Difficult to auscultate due to CPAP and inability to change positions Abdominal:     Palpations: Abdomen is soft.     Tenderness: There is no abdominal tenderness.  Musculoskeletal:  Right lower leg: No edema.     Left lower leg: No edema.  Neurological:     Mental Status: He is alert.     Comments: At baseline  Psychiatric:        Mood and Affect: Mood normal.        Behavior: Behavior normal.     LABS on Admission: I have personally reviewed all the labs and imaging below    Basic Metabolic Panel: Recent Labs  Lab 01/12/21 0124  NA 136  K 4.3  CL 99  CO2 29  GLUCOSE 108*  BUN 15  CREATININE <0.30*  CALCIUM 8.9   Liver Function Tests: Recent Labs  Lab 01/12/21 0124  AST 57*  ALT 104*  ALKPHOS 141*  BILITOT 0.8  PROT 7.7  ALBUMIN 3.9   No results for input(s): LIPASE, AMYLASE in the last 168 hours. No results for input(s): AMMONIA in the last 168 hours. CBC: Recent Labs  Lab 01/11/21 2315  WBC 6.7  NEUTROABS 4.6  HGB 11.3*  HCT 36.7*  MCV 83.8  PLT 142*   Cardiac Enzymes: No results for input(s): CKTOTAL, CKMB, CKMBINDEX, TROPONINI in the last 168 hours. BNP: Invalid input(s): POCBNP CBG: No results for input(s): GLUCAP in the last 168 hours.  Radiological Exams on Admission:  CT Angio Chest PE W and/or Wo Contrast  Addendum Date: 01/12/2021   ADDENDUM REPORT: 01/12/2021 04:52 ADDENDUM: Study discussed by telephone with Dr. Roslynn Amble in the ED on 01/12/2021 at 0438 hours. And he called my attention to a more  recent comparison chest CT 08/21/2020. The small left-side pneumothorax, and much of the left lung surface reticulonodular changes, are stable since November. The small right pneumothorax is new since that time, where as the small component of right pleural effusion has regressed since November. Right lung surface reticulonodular changes have mildly increased since November, while right lower lobe peribronchial ground-glass opacity has resolved. CONCLUSION: 1. Stable left lung since November, with persistent small left pneumothorax. 2. New small right pneumothorax, but regressed right pleural effusion and right lower lobe bronchopneumonia since November. Electronically Signed   By: Genevie Ann M.D.   On: 01/12/2021 04:52   Result Date: 01/12/2021 CLINICAL DATA:  25 year old male with muscular dystrophy. Respiratory failure, shortness of breath. EXAM: CT ANGIOGRAPHY CHEST WITH CONTRAST TECHNIQUE: Multidetector CT imaging of the chest was performed using the standard protocol during bolus administration of intravenous contrast. Multiplanar CT image reconstructions and MIPs were obtained to evaluate the vascular anatomy. CONTRAST:  29m OMNIPAQUE IOHEXOL 350 MG/ML SOLN COMPARISON:  Portable chest 2359 hours.  Chest CTA 12/01/2017. FINDINGS: Cardiovascular: Excellent contrast bolus timing in the pulmonary arterial tree. No focal filling defect identified in the pulmonary arteries to suggest acute pulmonary embolism. Right side PICC line. No cardiomegaly or pericardial effusion. Negative visible aorta. 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 No mediastinal lymphadenopathy. Hilar lymph node tissue not significantly changed. Lungs/Pleura: Small right hydropneumothorax, with pleural gas most pronounced at the anterior costophrenic angle (series 7, image 100). Small volume dependent low-density pleural fluid. Reticulonodular changes along the anterior surface of all right lung lobes, perhaps  atelectasis but infection not excluded. Similar peripheral reticulonodular changes in the left lung also with trace pneumothorax (left lung apex series 7, image 15). Small peripheral subpleural 11 mm opacity in the lateral basal segment of the left lower lobe, new since 2019. No left pleural effusion. No consolidation. Major airways are patent. Upper Abdomen: No pneumoperitoneum in the visible upper  abdomen. Visible liver, spleen, pancreas, adrenal glands, kidneys and bowel appear within normal limits. Musculoskeletal: Osteopenia with levoconvex scoliosis. No rib fracture identified. No acute osseous abnormality identified. Review of the MIP images confirms the above findings. IMPRESSION: 1. Negative for acute pulmonary embolus, but positive for Small Bilateral Pneumothoraces - greater on the right with small hydropneumothorax on that side. These might not be suitable for chest tube placement. 2. Superimposed nonspecific reticulonodular thickening along the surfaces of both lungs - possibly atelectasis but lung infection not excluded. Small nonspecific subpleural opacity also in the left lower lobe. No left pleural fluid. 3. Osteopenia.  No acute osseous abnormality identified. 4. Negative visible upper abdominal viscera. Electronically Signed: By: Genevie Ann M.D. On: 01/12/2021 04:37   DG Chest Port 1 View  Result Date: 01/12/2021 CLINICAL DATA:  Left back pain.  Shortness of breath EXAM: PORTABLE CHEST 1 VIEW COMPARISON:  10/26/2020 FINDINGS: Right PICC line in place with the tip in the right atrium. Heart is normal size. Linear scarring in the right upper lobe. No acute confluent opacities or effusions. No acute bony abnormality. IMPRESSION: No acute cardiopulmonary disease. Electronically Signed   By: Rolm Baptise M.D.   On: 01/12/2021 00:06      EKG: unchanged from previous tracings, sinus tachycardia   A & P   Principal Problem:   Pneumothorax on right Active Problems:   Muscular dystrophy  (HCC)   Abnormal LFTs   Pneumothorax on left   On total parenteral nutrition (TPN)   1. New small right pneumothorax with known small left pneumothorax a. Currently tolerating baseline CPAP and O2 requirements b. Pulmonology consulted  2. Chronic hypoxic and hypercapnic respiratory failure, at baseline  3. Sinus tachycardia a. On beta-blocker at baseline but given the soft BPs will hold for now  4. Ileus s/p diverting ostomy onTPN a. Dietitian consult  5. Regressed right lower lobe bronchopneumonia a. Noted on CT scans since November b. Hold off on further antibiotics for now since he is afebrile without leukocytosis and at baseline O2 requirements on last pulmonology has other recommendations  6. Elevated LFTs a. Alk phos 141, AST 57, ALT 104.  Increased from prior b. Per father, patient has increased flatus -> Maalox as needed c. RUQ ultrasound  7. Muscular dystrophy, at baseline    DVT prophylaxis: Lovenox   Code Status: Full Code  Diet: TPN Family Communication: Admission, patients condition and plan of care including tests being ordered have been discussed with the patient who indicates understanding and agrees with the plan and Code Status. Patient's father was updated  Disposition Plan: The appropriate patient status for this patient is OBSERVATION. Observation status is judged to be reasonable and necessary in order to provide the required intensity of service to ensure the patient's safety. The patient's presenting symptoms, physical exam findings, and initial radiographic and laboratory data in the context of their medical condition is felt to place them at decreased risk for further clinical deterioration. Furthermore, it is anticipated that the patient will be medically stable for discharge from the hospital within 2 midnights of admission. The following factors support the patient status of observation.   " The patient's presenting symptoms include left back  pain. " The physical exam findings include unremarkable. " The initial radiographic and laboratory data are pneumothorax bilaterally.    Status is: Observation  The patient remains OBS appropriate and will d/c before 2 midnights.  Dispo: The patient is from: Home  Anticipated d/c is to: Home              Patient currently is not medically stable to d/c.   Difficult to place patient No     Consultants  . PCCM  Procedures  . None  Time Spent on Admission: 65 minutes    Peter Hedge, DO [facility]  01/12/2021, 8:51 AM

## 2021-01-13 ENCOUNTER — Observation Stay (HOSPITAL_COMMUNITY): Payer: Managed Care, Other (non HMO)

## 2021-01-13 DIAGNOSIS — J939 Pneumothorax, unspecified: Secondary | ICD-10-CM | POA: Diagnosis not present

## 2021-01-13 LAB — BLOOD CULTURE ID PANEL (REFLEXED) - BCID2

## 2021-01-13 LAB — GLUCOSE, CAPILLARY
Glucose-Capillary: 148 mg/dL — ABNORMAL HIGH (ref 70–99)
Glucose-Capillary: 166 mg/dL — ABNORMAL HIGH (ref 70–99)
Glucose-Capillary: 206 mg/dL — ABNORMAL HIGH (ref 70–99)
Glucose-Capillary: 39 mg/dL — CL (ref 70–99)
Glucose-Capillary: 44 mg/dL — CL (ref 70–99)
Glucose-Capillary: 76 mg/dL (ref 70–99)
Glucose-Capillary: 82 mg/dL (ref 70–99)

## 2021-01-13 LAB — CBC
HCT: 40.6 % (ref 39.0–52.0)
Hemoglobin: 12.3 g/dL — ABNORMAL LOW (ref 13.0–17.0)
MCH: 25.1 pg — ABNORMAL LOW (ref 26.0–34.0)
MCHC: 30.3 g/dL (ref 30.0–36.0)
MCV: 82.7 fL (ref 80.0–100.0)
Platelets: 152 10*3/uL (ref 150–400)
RBC: 4.91 MIL/uL (ref 4.22–5.81)
RDW: 15.4 % (ref 11.5–15.5)
WBC: 14.7 10*3/uL — ABNORMAL HIGH (ref 4.0–10.5)
nRBC: 0 % (ref 0.0–0.2)

## 2021-01-13 LAB — BASIC METABOLIC PANEL
Anion gap: 14 (ref 5–15)
BUN: 23 mg/dL — ABNORMAL HIGH (ref 6–20)
CO2: 29 mmol/L (ref 22–32)
Calcium: 9.7 mg/dL (ref 8.9–10.3)
Chloride: 96 mmol/L — ABNORMAL LOW (ref 98–111)
Creatinine, Ser: <0.3 mg/dL — ABNORMAL LOW (ref 0.61–1.24)
Glucose, Bld: 48 mg/dL — ABNORMAL LOW (ref 70–99)
Potassium: 3.2 mmol/L — ABNORMAL LOW (ref 3.5–5.1)
Sodium: 139 mmol/L (ref 135–145)

## 2021-01-13 LAB — MRSA PCR SCREENING: MRSA by PCR: NEGATIVE

## 2021-01-13 MED ORDER — FAT EMUL FISH OIL/PLANT BASED 20% (SMOFLIPID)IV EMUL
INTRAVENOUS | Status: DC
  Filled 2021-01-13: qty 840

## 2021-01-13 MED ORDER — POTASSIUM CHLORIDE 10 MEQ/50ML IV SOLN
10.0000 meq | INTRAVENOUS | Status: AC
  Administered 2021-01-13 (×4): 10 meq via INTRAVENOUS
  Filled 2021-01-13 (×4): qty 50

## 2021-01-13 MED ORDER — DEXTROSE-NACL 5-0.9 % IV SOLN: INTRAVENOUS | Status: AC

## 2021-01-13 MED ORDER — METOPROLOL TARTRATE 25 MG PO TABS
25.0000 mg | ORAL_TABLET | Freq: Two times a day (BID) | ORAL | Status: DC
  Administered 2021-01-13: 25 mg via ORAL
  Filled 2021-01-13 (×3): qty 1

## 2021-01-13 MED ORDER — DEXTROSE 50 % IV SOLN
INTRAVENOUS | Status: AC
  Filled 2021-01-13: qty 50

## 2021-01-13 MED ORDER — POTASSIUM CHLORIDE CRYS ER 20 MEQ PO TBCR
20.0000 meq | EXTENDED_RELEASE_TABLET | ORAL | Status: AC
  Administered 2021-01-13 (×2): 20 meq via ORAL
  Filled 2021-01-13 (×2): qty 1

## 2021-01-13 MED ORDER — TRAVASOL 10 % IV SOLN
INTRAVENOUS | Status: DC
  Filled 2021-01-13: qty 840

## 2021-01-13 MED ORDER — ORAL CARE MOUTH RINSE
15.0000 mL | Freq: Two times a day (BID) | OROMUCOSAL | Status: DC
  Administered 2021-01-13 (×2): 15 mL via OROMUCOSAL

## 2021-01-13 MED ORDER — CHLORHEXIDINE GLUCONATE 0.12 % MT SOLN
15.0000 mL | Freq: Two times a day (BID) | OROMUCOSAL | Status: DC
  Administered 2021-01-13 – 2021-01-14 (×3): 15 mL via OROMUCOSAL
  Filled 2021-01-13 (×4): qty 15

## 2021-01-13 MED ORDER — METOPROLOL TARTRATE 5 MG/5ML IV SOLN
2.5000 mg | INTRAVENOUS | Status: AC | PRN
  Administered 2021-01-13 (×2): 2.5 mg via INTRAVENOUS
  Filled 2021-01-13 (×2): qty 5

## 2021-01-13 MED ORDER — INSULIN ASPART 100 UNIT/ML ~~LOC~~ SOLN: 0.0000 [IU] | SUBCUTANEOUS | Status: DC

## 2021-01-13 MED ORDER — INSULIN ASPART 100 UNIT/ML ~~LOC~~ SOLN
0.0000 [IU] | SUBCUTANEOUS | Status: DC
  Administered 2021-01-13: 2 [IU] via SUBCUTANEOUS
  Administered 2021-01-14 (×4): 1 [IU] via SUBCUTANEOUS

## 2021-01-13 MED ORDER — DEXTROSE 50 % IV SOLN
25.0000 g | INTRAVENOUS | Status: AC
  Administered 2021-01-13: 25 g via INTRAVENOUS

## 2021-01-13 NOTE — Progress Notes (Signed)
PROGRESS NOTE  Peter Hayes  DOB: 1996/08/02  PCP: Maye Hides, [place] FNF:[id number]  DOA: 01/11/2021  LOS: 0 days   Chief Complaint  Patient presents with  . Shortness of Breath   Brief narrative: Peter Hayes is a 25 y.o. 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 Patient presented to the ED with shortness of breath and left-sided back pain for 2 to 4 days without associated cough. Father stated there has not been any change in ostomy output but patient has had increased flatus.  Denies any fevers.  No trauma.  Otherwise no complaints  In the ED, patient was afebrile, tachycardic, tachypneic and was on room air. Labs mostly unremarkable. CTA chest showed new small right pneumothorax but regressed right pleural effusion and right lower lobe bronchopneumonia since November and stable left lung since November with persistent small left pneumothorax.   Patient was admitted to hospitalist service.  Pulmonary consultation was obtained.    Subjective: Patient was seen and examined this morning.  Young Hispanic male.  On CPAP.  Awake, alert. Father at bedside.  He states that he mentioned to multiple providers and staff since arrival on 4/6 patient is on TPN at home.  However he was not started on TPN for unknown reason.  His blood sugar dropped down to 30s this morning. Patient also remains tachycardic and has low blood pressure.  Assessment/Plan: New small right pneumothorax with known small left pneumothorax Currently tolerating baseline CPAP and O2 requirements Pulmonology consult appreciated.  Repeat chest x-ray obtained today shows stable pneumothorax.  No need of intervention at this time.  Hypoglycemia Paralytic ileus s/p diverting  ostomy -Very minimal oral intake.  Chronically on TPN -For unclear reason, patient was not resumed on TPN for 36 hours because of which, his blood sugar dropped down to 30s this morning. -We will resume TPN today.  It will be available for administration only this evening.  For the time being, I will start the patient on D5 NS at 50 mill per hour.  Monitor fingersticks. -Dietary consultation  Sinus tachycardia Hypotension -Reportedly patient already has sinus tachycardia but his heart rate is elevated to 130s with low blood pressure. -I resumed metoprolol this morning.  Would also continue IV fluid.  Expect improvement in heart rate and blood pressure next 24 hours for discharge.  Chronic hypoxic and hypercapnic respiratory failure, at baseline -Chronically on CPAP  Regressed right lower lobe bronchopneumonia -Noted on CT scans since November -Hold off on further antibiotics for now since he is afebrile without leukocytosis and at baseline O2 requirements.  Elevated LFTs -RUQ ultrasound normal.  Continue to monitor Recent Labs  Lab 01/12/21 0124  AST 57*  ALT 104*  ALKPHOS 141*  BILITOT 0.8  PROT 7.7  ALBUMIN 3.9   Muscular dystrophy -Supportive care  Mobility: Bedbound status Code Status:   Code Status: Full Code  Nutritional status: Body mass index is 12.98 kg/m. Nutrition Problem: Inadequate oral intake Etiology: chronic illness,altered GI function Signs/Symptoms: other (comment) (need for TPN to meet nutrition needs) Diet Order            DIET SOFT Room service appropriate? Yes; Fluid consistency: Thin  Diet effective now                 DVT prophylaxis: Lovenox subcu  Antimicrobials:  1 dose of IV Rocephin and erythromycin was given in the ED. Fluid: D5 NS at 50 mill per hour Consultants: Pulmonology Family Communication:  Father at bedside  Status is: Observation  The patient will require care spanning > 2 midnights and should be moved to inpatient  because: Hemodynamically unstable  Dispo: The patient is from: Home              Anticipated d/c is to: Home              Patient currently is not medically stable to d/c.   Difficult to place patient No       Infusions:  . dextrose 5 % and 0.9% NaCl 50 mL/hr at 01/13/21 1120  . TPN ADULT (ION)    . TPN ADULT (ION)      Scheduled Meds: . chlorhexidine  15 mL Mouth Rinse BID  . Chlorhexidine Gluconate Cloth  6 each Topical Daily  . enoxaparin (LOVENOX) injection  20 mg Subcutaneous Daily  . insulin aspart  0-9 Units Subcutaneous Q4H  . mouth rinse  15 mL Mouth Rinse q12n4p  . metoprolol tartrate  25 mg Oral BID  . scopolamine  2 patch Transdermal Q72H  . sodium chloride flush  10-40 mL Intracatheter Q12H    Antimicrobials: Anti-infectives (From admission, onward)   Start     Dose/Rate Route Frequency Ordered Stop   01/12/21 0545  cefTRIAXone (ROCEPHIN) 1 g in sodium chloride 0.9 % 100 mL IVPB        1 g 200 mL/hr over 30 Minutes Intravenous  Once 01/12/21 0540 01/12/21 0639   01/12/21 0545  azithromycin (ZITHROMAX) 500 mg in sodium chloride 0.9 % 250 mL IVPB        500 mg 250 mL/hr over 60 Minutes Intravenous  Once 01/12/21 0540 01/12/21 0849      PRN meds: acetaminophen **OR** acetaminophen, alum & mag hydroxide-simeth, ipratropium-albuterol, ondansetron (ZOFRAN) IV, ondansetron, polyethylene glycol, sodium chloride flush   Objective: Vitals:   01/13/21 1000 01/13/21 1200  BP: 90/60   Pulse: (!) 132   Resp: 20   Temp:  98.2 F (36.8 C)  SpO2: 97%     Intake/Output Summary (Last 24 hours) at 01/13/2021 1326 Last data filed at 01/13/2021 1120 Gross per 24 hour  Intake 367.02 ml  Output --  Net 367.02 ml   Filed Weights   01/12/21 0852  Weight: 35.4 kg   Weight change:  Body mass index is 12.98 kg/m.   Physical Exam: General exam: Young male with muscular dystrophy, on CPAP Skin: No rashes, lesions or ulcers. HEENT: Atraumatic, normocephalic, no  obvious bleeding Lungs: Clear to auscultate bilaterally CVS: Regular rate and rhythm, no murmur GI/Abd soft, nontender, nondistended, bowel sound present CNS: Alert, awake, able to move lips to verbalize. Psychiatry: Mood appropriate Extremities: No pedal edema, no calf tenderness  Data Review: I have personally reviewed the laboratory data and studies available.  Recent Labs  Lab 01/11/21 2315 01/13/21 0510  WBC 6.7 14.7*  NEUTROABS 4.6  --   HGB 11.3* 12.3*  HCT 36.7* 40.6  MCV 83.8 82.7  PLT 142* 152   Recent Labs  Lab 01/12/21 0124 01/13/21 0510  NA 136 139  K 4.3 3.2*  CL 99 96*  CO2 29 29  GLUCOSE 108* 48*  BUN 15 23*  CREATININE <0.30* <0.30*  CALCIUM 8.9 9.7    F/u labs ordered Unresulted Labs (From admission, onward)  Start     Ordered   01/16/21 0500  Comprehensive metabolic panel  (TPN Lab Panel)  Every Mon,Thu (0500),   R     Question:  Specimen collection method  Answer:  Unit=Unit collect   01/13/21 0853   01/16/21 0500  Magnesium  (TPN Lab Panel)  Every Mon,Thu (0500),   R     Question:  Specimen collection method  Answer:  Unit=Unit collect   01/13/21 0853   01/16/21 0500  Phosphorus  (TPN Lab Panel)  Every Mon,Thu (0500),   R     Question:  Specimen collection method  Answer:  Unit=Unit collect   01/13/21 0853   01/16/21 0500  CBC  (TPN Lab Panel)  Every Monday (0500),   R     Question:  Specimen collection method  Answer:  Unit=Unit collect   01/13/21 0853   01/16/21 0500  Differential  (TPN Lab Panel)  Every Monday (0500),   R     Question:  Specimen collection method  Answer:  Unit=Unit collect   01/13/21 0853   01/16/21 0500  Triglycerides  (TPN Lab Panel)  Every Monday (0500),   R     Question:  Specimen collection method  Answer:  Unit=Unit collect   01/13/21 0853   01/16/21 0500  Prealbumin  (TPN Lab Panel)  Every Monday (0500),   R     Question:  Specimen collection method  Answer:  Unit=Unit collect   01/13/21 0853    01/14/21 0500  Comprehensive metabolic panel  (TPN Lab Panel)  Tomorrow morning,   R       Question:  Specimen collection method  Answer:  Unit=Unit collect   01/13/21 0853   01/14/21 0500  Prealbumin  (TPN Lab Panel)  Tomorrow morning,   R       Question:  Specimen collection method  Answer:  Unit=Unit collect   01/13/21 0853   01/14/21 0500  Magnesium  (TPN Lab Panel)  Tomorrow morning,   R       Question:  Specimen collection method  Answer:  Unit=Unit collect   01/13/21 0853   01/14/21 0500  Phosphorus  (TPN Lab Panel)  Tomorrow morning,   R       Question:  Specimen collection method  Answer:  Unit=Unit collect   01/13/21 0853   01/14/21 0500  Triglycerides  (TPN Lab Panel)  Tomorrow morning,   R       Question:  Specimen collection method  Answer:  Unit=Unit collect   01/13/21 0853   01/14/21 0500  CBC  (TPN Lab Panel)  Tomorrow morning,   R       Question:  Specimen collection method  Answer:  Unit=Unit collect   01/13/21 0853   01/14/21 0500  Differential  (TPN Lab Panel)  Tomorrow morning,   R       Question:  Specimen collection method  Answer:  Unit=Unit collect   01/13/21 0853   01/13/21 9030  Basic metabolic panel  Daily,   R      01/12/21 0822   01/13/21 0500  CBC  Daily,   R      01/12/21 0923          Signed, Terrilee Croak, MD [facility] 01/13/2021

## 2021-01-13 NOTE — Progress Notes (Addendum)
   NAME:  Peter Hayes, MRN:  [id number], DOB:  Dec 08, 1995, LOS: 0 ADMISSION DATE:  01/11/2021, CONSULTATION DATE:  01/12/2021 REFERRING MD:  Dr Neysa Bonito, CHIEF COMPLAINT:  SOB, Chest pain   History of Present Illness:  History of muscular dystrophy, patient is bedbound with chronic hypoxic and hypercapnic respiratory failure. -Has been on ventilator since 2009 -Currently using AVAPS around-the-clock  Presented to the ED with shortness of breath, back pain and discomfort especially on the left side for 2 to 4 days No fevers or chills  Pain and discomfort is presently better  Pertinent  Medical History   Past Medical History:  Diagnosis Date  . Acute respiratory failure with hypoxia and hypercapnia ([place]) 05/16/2018  . HCAP (healthcare-associated pneumonia) 12/01/2017  . High anion gap metabolic acidosis 5/73/2202  . Hypotension   . Ileus ([place]) 09/23/2017  . Leukocytosis 05/05/2016  . MD (muscular dystrophy) ([place])   . Refusal of blood product    patient is Peter Hayes witness  . Respiratory alkalosis 05/05/2016  . Sepsis ([place]) 05/05/2016  . Severe sepsis ([place]) 09/16/2019  . Sinus tachycardia 05/05/2016     Significant Hospital Events: Including procedures, antibiotic start and stop dates in addition to other pertinent events   . On AVAPS  Interim History / Subjective:  Tachycardic and hypotensive this morning  Objective   Blood pressure (!) 88/67, pulse (!) 128, temperature 97.8 F (36.6 C), temperature source Axillary, resp. rate 19, weight 35.4 kg, SpO2 97 %.    Vent Mode: Other (Comment) Set Rate:  [5 bmp] 5 bmp Vt Set:  [400 mL] 400 mL   Intake/Output Summary (Last 24 hours) at 01/13/2021 0958 Last data filed at 01/13/2021 0911 Gross per 24 hour  Intake 124.26 ml  Output --  Net 124.26 ml   Filed Weights   01/12/21 0852  Weight: 35.4 kg    Examination: General: Young gentleman, does not appear to be in distress, mask in place HENT: Mask in place Lungs: Decreased air movement  bilaterally Cardiovascular: S1-S2 appreciated Abdomen: Bowel sounds appreciated Extremities:  Neuro: Sleepy, easily arousable, interactive GU:  Labs/imaging that I havepersonally reviewed  (right click and "Reselect all SmartList Selections" daily)  Chest x-ray today shows no significant change from previous Labs significant for hypokalemia-being repleted  Resolved Hospital Problem list     Assessment & Plan:  Bilateral pneumothoraces -Both pneumothoraces remain small -Pneumothorax on the left has been there for months and remained stable, chest x-ray shows stability -Right pneumothorax is new, basal, will be difficult to access.  However small enough not to still be noticeable on chest x-ray.  CT scan reviewed  Patient remains on AVAPS around-the-clock Positive pressures may be contributing to the pneumothoraces however they are stable at present  Has been stable over the last 24 hours of monitoring Chest x-ray does not show any significant change From a pulmonary perspective I believe he may be discharged  I did make contact with [facility] to try and make some changes to his AVAPS setting   Does not require any antibiotics at present  I did contact primary service today as well  Sherrilyn Rist, MD [place] [facility] Pager: [phone]  Ventilator changes made by [facility]  Stable to be discharged from my perspective

## 2021-01-13 NOTE — Progress Notes (Signed)
PHARMACY - PHYSICIAN COMMUNICATION CRITICAL VALUE ALERT - BLOOD CULTURE IDENTIFICATION (BCID)  Peter Hayes is an 25 y.o. male who presented to [facility] on 01/11/2021 with a chief complaint of SOB and pain  Assessment:  Gram positive cocci in clusters, staph species  Name of physician (or Provider) Contacted: [place]  Current antibiotics: none  Changes to prescribed antibiotics recommended:  none  Results for orders placed or performed during the hospital encounter of 01/11/21  Blood Culture ID Panel (Reflexed) (Collected: 01/12/2021  7:59 AM)  Result Value Ref Range   Enterococcus faecalis NOT DETECTED NOT DETECTED   Enterococcus Faecium NOT DETECTED NOT DETECTED   Listeria monocytogenes NOT DETECTED NOT DETECTED   Staphylococcus species DETECTED (A) NOT DETECTED   Staphylococcus aureus (BCID) NOT DETECTED NOT DETECTED   Staphylococcus epidermidis NOT DETECTED NOT DETECTED   Staphylococcus lugdunensis NOT DETECTED NOT DETECTED   Streptococcus species NOT DETECTED NOT DETECTED   Streptococcus agalactiae NOT DETECTED NOT DETECTED   Streptococcus pneumoniae NOT DETECTED NOT DETECTED   Streptococcus pyogenes NOT DETECTED NOT DETECTED   A.calcoaceticus-baumannii NOT DETECTED NOT DETECTED   Bacteroides fragilis NOT DETECTED NOT DETECTED   Enterobacterales NOT DETECTED NOT DETECTED   Enterobacter cloacae complex NOT DETECTED NOT DETECTED   Escherichia coli NOT DETECTED NOT DETECTED   Klebsiella aerogenes NOT DETECTED NOT DETECTED   Klebsiella oxytoca NOT DETECTED NOT DETECTED   Klebsiella pneumoniae NOT DETECTED NOT DETECTED   Proteus species NOT DETECTED NOT DETECTED   Salmonella species NOT DETECTED NOT DETECTED   Serratia marcescens NOT DETECTED NOT DETECTED   Haemophilus influenzae NOT DETECTED NOT DETECTED   Neisseria meningitidis NOT DETECTED NOT DETECTED   Pseudomonas aeruginosa NOT DETECTED NOT DETECTED   Stenotrophomonas maltophilia NOT DETECTED NOT DETECTED   Candida  albicans NOT DETECTED NOT DETECTED   Candida auris NOT DETECTED NOT DETECTED   Candida glabrata NOT DETECTED NOT DETECTED   Candida krusei NOT DETECTED NOT DETECTED   Candida parapsilosis NOT DETECTED NOT DETECTED   Candida tropicalis NOT DETECTED NOT DETECTED   Cryptococcus neoformans/gattii NOT DETECTED NOT DETECTED    Dolly Rias RPh 01/13/2021, 10:16 PM

## 2021-01-13 NOTE — Progress Notes (Signed)
Patient's father asked about home Metoprolol doses that have not been given. No orders seen and no known cause to not have. Paged on call hospital provider to inquire about ordering.

## 2021-01-13 NOTE — Progress Notes (Signed)
Initial Nutrition Assessment   DOCUMENTATION CODES:   Underweight  INTERVENTION:  - TPN per Pharmacist.    NUTRITION DIAGNOSIS:   Inadequate oral intake related to chronic illness,altered GI function as evidenced by other (comment) (need for TPN to meet nutrition needs).  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 He presented to the ED with SOB and L-sided back pain x2-4 days.  Patient and his dad, who is at bedside, are well-known to this RD from previous hospitalizations.   Discussed with RN and Pharmacist prior to entering patient's room. Possible d/c home today. He had not yet had anything PO. His dad reported to RN that patient will often go to sleep at 0500 and sleep until 1600. He receives cyclic TPN at home but order for continuous TPN planned while he is hospitalized.   Patient laying in bed with dad at bedside at this time. He had not had anything PO today but dad plans to order something for him in the near future. At home, all food is prepared by his parents and is blended in the blender to make a smoothie-like consistency. Patient typically eats 2-3 times/day and consumes 1-2 oz portions of blenderized meal at each eating event.   He has double lumen PICC in R basilic. Order in place for custom TPN at 70 ml/hr which will provide 1529 kcal and 84 grams protein.   Weight yesterday was 78 lb, weight on 10/27/20 was 87 lb, weight on 10/24/20 was 77 lb, and weight on 08/26/20 was 81 lb. No  information documented in the edema section of flow sheet.    Labs reviewed; CBGs: 43, 44, 39, 39 206 mg/dl, K: 3.2 mmol/l, Cl: 96 mmol/l, BUN: 23 mg/dl, creatinine: <0.3 mg/dl. Medications reviewed; sliding scale novolog, 10 mEq IV KCl x4 runs 4/8, 20 mEq Klor-Con x2 doses 4/8.  IVF; D5-NS @ 50 ml/hr (204 kcal/24 hours).    NUTRITION - FOCUSED PHYSICAL EXAM:  NFPE not appropriate for patient with muscle-wasting disease (muscular dystrophy)  Diet Order:   Diet Order            DIET SOFT Room service appropriate? Yes; Fluid consistency: Thin  Diet effective now                 EDUCATION NEEDS:   No education needs have been identified at this time  Skin:  Skin Assessment: Reviewed RN Assessment  Last BM:  PTA/unknown  Height:   Ht Readings from Last 1 Encounters:  10/27/20 $RemoveB'5\' 5"'myFtRDqQ$  (1.651 m)    Weight:   Wt Readings from Last 1 Encounters:  01/12/21 35.4 kg     Estimated Nutritional Needs:  Kcal:  1415-1630 kcal Protein:  70-80 grams Fluid:  >/= 1.6 L/day      Jarome Matin, MS, RD, LDN, CNSC Inpatient Clinical Dietitian RD pager # available in [facility]  After hours/weekend pager # available in [facility]

## 2021-01-13 NOTE — TOC Initial Note (Signed)
Transition of Care [facility]) - Initial/Assessment Note    Patient Details  Name: Peter Hayes MRN: [id number] Date of Birth: [birth year]  [facility] CM/SW Contact:    Leeroy Cha, RN Phone Number: 01/13/2021, 8:20 AM  Clinical Narrative:                  Chief Complaint       Chief Complaint  Patient presents with  . 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  Says this is different from his normal pneumonia symptoms.  Father states there has not been any change in ostomy output but patient has had increased flatus.  Denies any fevers.  No trauma.  Otherwise no complaints   ED Course: Afebrile, tachycardic, tachypneic,  on room air, soft BP. Notable Labs: Alk phos 141, AST 57, ALT 104, WBC 6.7, Hb 11.3, platelets 142, COVID-19 pending and otherwise labs unremarkable. Notable Imaging: CTA chest-new small right pneumothorax but regressed right pleural effusion and right lower lobe bronchopneumonia since November and stable left lung since November with persistent small left pneumothorax.  ED provider discussed with pulmonology who recommended hospitalist admission and formal pulmonology consult in a.m.  Patient received ceftriaxone and azithromycin.   PLAN: to return to home with parents  Expected Discharge Plan: [place] Barriers to Discharge: Continued Medical Work up   Patient Goals and CMS Choice Patient states their goals for this hospitalization and ongoing recovery are:: to go home CMS  Medicare.gov Compare Post Acute Care list provided to:: Patient    Expected Discharge Plan and Services Expected Discharge Plan: [place]   Discharge Planning Services: CM Consult   Living arrangements for the past 2 months: Single Family Home                                      Prior Living Arrangements/Services Living arrangements for the past 2 months: Single Family Home Lives with:: Parents Patient language and need for interpreter reviewed:: Yes Do you feel safe going back to the place where you live?: Yes      Need for Family Participation in Patient Care: Yes (Comment) (parents) Care giver support system in place?: Yes (comment)   Criminal Activity/Legal Involvement Pertinent to Current Situation/Hospitalization: No - Comment as needed  Activities of Daily Living Home Assistive Devices/Equipment: BIPAP,Enteral Feeding Supplies,Hospital bed,Wheelchair,Other (Comment),Ostomy supplies (suction machine, suction catheters, colostomy) ADL Screening (condition at time of admission) Patient's cognitive ability adequate to safely complete daily activities?: Yes Is the patient deaf or have difficulty hearing?: No Does the patient have difficulty seeing, even when wearing glasses/contacts?: No Does the patient have difficulty concentrating, remembering, or making decisions?: No Patient able to express need for assistance with ADLs?: Yes Does the patient have difficulty dressing or bathing?: Yes Independently performs ADLs?: No Communication: Independent Dressing (OT): Needs assistance Is this a change from baseline?: Pre-admission baseline Grooming: Needs assistance Is this a change from baseline?: Pre-admission  baseline Feeding: Needs assistance Is this a change from baseline?: Pre-admission baseline Bathing: Needs assistance Is this a change from baseline?: Pre-admission baseline Toileting: Needs assistance Is this a change from baseline?:  Pre-admission baseline In/Out Bed: Needs assistance Is this a change from baseline?: Pre-admission baseline Walks in Home: Dependent Is this a change from baseline?: Pre-admission baseline Does the patient have difficulty walking or climbing stairs?: Yes (secondary to muscular dystrophy) Weakness of Legs: Both Weakness of Arms/Hands: None  Permission Sought/Granted                  Emotional Assessment Appearance:: Appears stated age Attitude/Demeanor/Rapport: Engaged Affect (typically observed): Calm Orientation: : Oriented to Place,Oriented to Self,Oriented to  Time,Oriented to Situation Alcohol / Substance Use: Not Applicable Psych Involvement: No (comment)  Admission diagnosis:  Pneumothorax on right [J93.9] LFTs abnormal [R94.5] Pneumothorax [J93.9] Pneumothorax, unspecified type [J93.9] Patient Active Problem List   Diagnosis Date Noted  . Pneumothorax on right 01/12/2021  . Pneumothorax 01/12/2021  . Staphylococcus aureus bacteremia 10/27/2020  . On total parenteral nutrition (TPN) 10/26/2020  . Bacteremia due to coagulase-negative Staphylococcus 10/26/2020  . Coag negative Staphylococcus bacteremia 08/21/2020  . Abnormal LFTs 08/21/2020  . Acute metabolic encephalopathy 53/66/4403  . Pneumothorax on left 08/21/2020  . Sepsis ([place]) 06/14/2020  . Pneumomediastinum ([place]) 02/06/2020  . Pressure injury of skin 09/17/2019  . 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 Hypoglycemia without diagnosis of diabetes mellitus 09/24/2017  . Muscular dystrophy ([place]) 05/05/2016  . Moderate protein-calorie malnutrition ([place]) 05/05/2016   PCP:  Penni Bombard, PA Pharmacy:   [facility] [place] [address] [place]  [address] Phone: [phone] Fax: [phone]     Social Determinants of Health (SDOH) Interventions    Readmission Risk Interventions No  flowsheet data found.

## 2021-01-13 NOTE — Progress Notes (Signed)
K+ 3.2 Replaced per protocol  

## 2021-01-13 NOTE — Progress Notes (Signed)
PHARMACY - TOTAL PARENTERAL NUTRITION CONSULT NOTE   Indication: on TPN PTA  Patient Measurements: Weight: 35.4 kg (78 lb)   Body mass index is 12.98 kg/m. Usual Weight: 35.4 kg  Assessment: 25 yo M on chronic TPN PTA. Pharmacy consulted to manage TPN while in hospital.   past medical history of muscular dystrophy and bedbound, chronic hypoxic and hypercapnic respiratory failure on CPAP, ileus s/p diverting ostomy with TPN and recent PICC line associated Staph caprae bacteremia, recurrent pneumonia and hospitalizations, pneumomediastinum Glucose / Insulin: no hx DM, has been hypoglycemic off TPN, D5NS @ 50 ml/hr per TRH Electrolytes: K 3.2, TRH replacing with 4 runs + 40 po Renal:  WNL/low Hepatic: elevated LFTs Intake / Output; MIVF: urine x 3;  GI Imaging: GI Surgeries / Procedures:  Hx ileus s/p diverting ostomy on cyclic TPN PTA  Central access: PICC placed 10/31/20 PTA TPN start date: on PTA  Nutritional Goals (per RD recommendation on 08/22/2020): kCal: 1425-1630, Protein: 75-90, Fluid: >= 1.6 L/day Goal TPN rate is 70 mL/hr (provides 84 g of protein and 1529 kcals per day) PTA TPN: 1000 mls over 18 hrs with MVI  Current Nutrition:   Full liquids & TPN  Plan:  Start TPN at mL/hr 70 ml/hr at 1800 to provide full support with 84 gm protein & 1529 kcal Electrolytes in TPN: Na 75mEq/L, K 61mEq/L, Ca 18mEq/L, Mg 45mEq/L, and Phos 64mmol/L. Cl:Ac 1:1 Add standard MVI and trace elements to TPN DC MIVFat 1800 Monitor TPN labs on Mon/Thurs, full TPN labs in AM  Eudelia Bunch, Pharm.D 01/13/2021 11:32 AM

## 2021-01-13 NOTE — Progress Notes (Signed)
Provider paged regarding patient's HR. HR is in the 120's-130's sinus tach. Awaiting new orders.

## 2021-01-13 NOTE — Progress Notes (Signed)
Pt remains tachycardic with HR at 135. Hospitalist paged and notified again. Will continue to monitor.

## 2021-01-14 DIAGNOSIS — J939 Pneumothorax, unspecified: Secondary | ICD-10-CM | POA: Diagnosis not present

## 2021-01-14 LAB — CBC
HCT: 34.1 % — ABNORMAL LOW (ref 39.0–52.0)
Hemoglobin: 10 g/dL — ABNORMAL LOW (ref 13.0–17.0)
MCH: 25.6 pg — ABNORMAL LOW (ref 26.0–34.0)
MCHC: 29.3 g/dL — ABNORMAL LOW (ref 30.0–36.0)
MCV: 87.4 fL (ref 80.0–100.0)
Platelets: 117 10*3/uL — ABNORMAL LOW (ref 150–400)
RBC: 3.9 MIL/uL — ABNORMAL LOW (ref 4.22–5.81)
RDW: 15.9 % — ABNORMAL HIGH (ref 11.5–15.5)
WBC: 5.5 10*3/uL (ref 4.0–10.5)
nRBC: 0 % (ref 0.0–0.2)

## 2021-01-14 LAB — DIFFERENTIAL
Abs Immature Granulocytes: 0 10*3/uL (ref 0.00–0.07)
Basophils Absolute: 0 10*3/uL (ref 0.0–0.1)
Basophils Relative: 0 %
Eosinophils Absolute: 0 10*3/uL (ref 0.0–0.5)
Eosinophils Relative: 1 %
Immature Granulocytes: 0 %
Lymphocytes Relative: 26 %
Lymphs Abs: 1.4 10*3/uL (ref 0.7–4.0)
Monocytes Absolute: 1.1 10*3/uL — ABNORMAL HIGH (ref 0.1–1.0)
Monocytes Relative: 20 %
Neutro Abs: 2.9 10*3/uL (ref 1.7–7.7)
Neutrophils Relative %: 53 %

## 2021-01-14 LAB — COMPREHENSIVE METABOLIC PANEL
ALT: 113 U/L — ABNORMAL HIGH (ref 0–44)
AST: 50 U/L — ABNORMAL HIGH (ref 15–41)
Albumin: 3.6 g/dL (ref 3.5–5.0)
Alkaline Phosphatase: 147 U/L — ABNORMAL HIGH (ref 38–126)
Anion gap: 6 (ref 5–15)
BUN: 18 mg/dL (ref 6–20)
CO2: 32 mmol/L (ref 22–32)
Calcium: 8.5 mg/dL — ABNORMAL LOW (ref 8.9–10.3)
Chloride: 101 mmol/L (ref 98–111)
Creatinine, Ser: <0.3 mg/dL — ABNORMAL LOW (ref 0.61–1.24)
Glucose, Bld: 115 mg/dL — ABNORMAL HIGH (ref 70–99)
Potassium: 4 mmol/L (ref 3.5–5.1)
Sodium: 139 mmol/L (ref 135–145)
Total Bilirubin: 1 mg/dL (ref 0.3–1.2)
Total Protein: 7.2 g/dL (ref 6.5–8.1)

## 2021-01-14 LAB — GLUCOSE, CAPILLARY
Glucose-Capillary: 139 mg/dL — ABNORMAL HIGH (ref 70–99)
Glucose-Capillary: 140 mg/dL — ABNORMAL HIGH (ref 70–99)
Glucose-Capillary: 148 mg/dL — ABNORMAL HIGH (ref 70–99)

## 2021-01-14 LAB — MAGNESIUM: Magnesium: 1.4 mg/dL — ABNORMAL LOW (ref 1.7–2.4)

## 2021-01-14 LAB — PHOSPHORUS: Phosphorus: 3.2 mg/dL (ref 2.5–4.6)

## 2021-01-14 LAB — PREALBUMIN: Prealbumin: 8.1 mg/dL — ABNORMAL LOW (ref 18–38)

## 2021-01-14 LAB — TRIGLYCERIDES: Triglycerides: 255 mg/dL — ABNORMAL HIGH (ref <150)

## 2021-01-14 MED ORDER — MAGNESIUM SULFATE 4 GM/100ML IV SOLN
4.0000 g | Freq: Once | INTRAVENOUS | Status: AC
  Administered 2021-01-14: 4 g via INTRAVENOUS
  Filled 2021-01-14: qty 100

## 2021-01-14 NOTE — Discharge Summary (Signed)
Physician Discharge Summary  Peter Hayes HJJ:[id number] DOB: March 16, 1996 DOA: 01/11/2021  PCP: Maye Hides, PA  Admit date: 01/11/2021 Discharge date: 01/14/2021  Admitted From: Home Discharge disposition: Home   Code Status: Full Code  Diet Recommendation: TPN, luxury oral feeding  Discharge Diagnosis:   Principal Problem:   Pneumothorax on right Active Problems:   Muscular dystrophy (HCC)   Abnormal LFTs   Pneumothorax on left   On total parenteral nutrition (TPN)   Pneumothorax  Chief Complaint  Patient presents with  . Shortness of Breath   Brief narrative: Peter Hayes is a 25 y.o. 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 Patient presented to the ED with shortness of breath and left-sided back pain for 2 to 4 days without associated cough. Father stated there has not been any change in ostomy output but patient has had increased flatus.  Denies any fevers.  No trauma.  Otherwise no complaints  In the ED, patient was afebrile, tachycardic, tachypneic and was on room air. Labs mostly unremarkable. CTA chest showed new small right pneumothorax but regressed right pleural effusion and right lower lobe bronchopneumonia since November and stable left lung since November with persistent small left pneumothorax.   Patient was admitted to hospitalist service.  Pulmonary consultation was obtained.   See below for details.  Subjective: Patient was seen and examined this morning.  Young Hispanic male.  On CPAP.  Awake, alert. Father at bedside.  On TPN.  Heart rate and blood pressure better.  Noted a growth of staph on recent blood culture last night.  Contamination  Hospital course: New small right pneumothorax with known small  left pneumothorax Currently tolerating baseline CPAP and O2 requirements Pulmonology consult appreciated.  Repeat chest x-ray obtained on 4/8 showed stable pneumothorax.  No need of intervention at this time.  Staph bacteremia -Staph species is growing 1 bottle.  Contaminated likely.  Discussed with infectious disease Dr. Algis Liming.  No need of antibiotic treatment  Hypoglycemia Paralytic ileus s/p diverting ostomy -chronically on TPN -Very minimal oral intake.  Chronically on TPN.  Continue the same.  Sinus tachycardia Hypotension -Tachycardia and hypotension likely because of dehydration.  Resolving after TPN was resumed yesterday.    Chronic hypoxic and hypercapnic respiratory failure, at baseline -Chronically on CPAP  Regressed right lower lobe bronchopneumonia -Noted on CT scans since November -Not on antibiotics for now since he is afebrile without leukocytosis and at baseline O2 requirements.  Elevated LFTs -RUQ ultrasound normal.  Continue to monitor Recent Labs  Lab 01/12/21 0124 01/14/21 0629  AST 57* 50*  ALT 104* 113*  ALKPHOS 141* 147*  BILITOT 0.8 1.0  PROT 7.7 7.2  ALBUMIN 3.9 3.6   Muscular dystrophy -Supportive care   Wound care: Pressure Injury 09/16/19 Nose Upper Stage II -  Partial thickness loss of dermis presenting as a shallow open ulcer with a red, pink wound bed without slough. Exposed cartiledge from Bipap mask at home (Active)  Date First Assessed/Time First Assessed: 09/16/19 1000   Location: Nose  Location Orientation: Upper  Staging: Stage II -  Partial thickness loss of dermis presenting as a shallow open ulcer with a red, pink wound bed without slough.  Wound Descriptio...    Assessments 09/16/2019  7:06 PM 09/20/2019  8:00 PM  Dressing Type -- None  Site / Wound Assessment -- Clean;Dry;Pink  Peri-wound Assessment -- Intact  Wound Length (cm) 1 cm --  Wound Width (cm) 1.5 cm --  Wound Surface Area (cm^2) 1.5 cm^2 --  Drainage Amount --  None  Treatment -- Other (Comment)     No Linked orders to display     Wound / Incision (Open or Dehisced) 08/21/20 Skin tear Sacrum (Active)  Date First Assessed/Time First Assessed: 08/21/20 1905   Wound Type: Skin tear  Location: Sacrum  Present on Admission: Yes    Assessments 08/21/2020  7:00 PM 08/27/2020  9:00 AM  Dressing Type Foam - Lift dressing to assess site every shift Foam - Lift dressing to assess site every shift  Dressing Status -- Clean;Dry;Intact  Site / Wound Assessment -- Dressing in place / Unable to assess  Wound Length (cm) 1 cm --  Wound Width (cm) 1 cm --  Wound Surface Area (cm^2) 1 cm^2 --     No Linked orders to display    Discharge Exam:   Vitals:   01/14/21 0848 01/14/21 0900 01/14/21 1000 01/14/21 1001  BP:  99/69 (!) 100/54 (!) 100/54  Pulse:  100 (!) 109 (!) 110  Resp:  (!) 21 (!) 30   Temp:      TempSrc:      SpO2: 99% 99% 96%   Weight:        Body mass index is 13.98 kg/m.  General exam: Young male with muscular dystrophy, on CPAP. Not in pain. Skin: No rashes, lesions or ulcers. HEENT: Atraumatic, normocephalic, no obvious bleeding Lungs: Clear to auscultation bilaterally CVS: Regular rate and rhythm, no murmur GI/Abd soft, nontender, nondistended, bowel sound present CNS: Alert, awake, able to move lips to verbalize. Psychiatry: Mood appropriate Extremities: No pedal edema, no calf tenderness  Follow ups:   Discharge Instructions    Discharge wound care:   Complete by: As directed    Continue wound dressing as before.   Increase activity slowly   Complete by: As directed       Follow-up Information    [facility].   Contact information: [address] [place] [address] [phone]              Recommendations for Outpatient Follow-Up:   1. Follow-up with PCP as an outpatient  Discharge Instructions:  Follow with Primary MD Penni Bombard, PA  in 7 days   Get CBC/BMP checked in next visit within 1 week by PCP or SNF MD ( we routinely change or add medications that can affect your baseline labs and fluid status, therefore we recommend that you get the mentioned basic workup next visit with your PCP, your PCP may decide not to get them or add new tests based on their clinical decision)  On your next visit with your PCP, please Get Medicines reviewed and adjusted.  Please request your PCP  to go over all Hospital Tests and Procedure/Radiological results at the follow up, please get all Hospital records sent to your Prim MD by signing hospital release before you go home.  Activity: As tolerated with Full fall precautions use walker/cane & assistance as needed  For Heart failure patients - Check your Weight same time everyday, if you gain over 2 pounds, or you develop in leg swelling, experience more shortness of breath or chest pain, call your Primary MD immediately. Follow Cardiac Low Salt Diet and 1.5 lit/day fluid restriction.  If  you have smoked or chewed Tobacco in the last 2 yrs please stop smoking, stop any regular Alcohol  and or any Recreational drug use.  If you experience worsening of your admission symptoms, develop shortness of breath, life threatening emergency, suicidal or homicidal thoughts you must seek medical attention immediately by calling 911 or calling your MD immediately  if symptoms less severe.  You Must read complete instructions/literature along with all the possible adverse reactions/side effects for all the Medicines you take and that have been prescribed to you. Take any new Medicines after you have completely understood and accpet all the possible adverse reactions/side effects.   Do not drive, operate heavy machinery, perform activities at heights, swimming or participation in water activities or provide baby sitting services if your were admitted for syncope or siezures until you have seen by Primary MD or a  Neurologist and advised to do so again.  Do not drive when taking Pain medications.  Do not take more than prescribed Pain, Sleep and Anxiety Medications  Wear Seat belts while driving.   Please note You were cared for by a hospitalist during your hospital stay. If you have any questions about your discharge medications or the care you received while you were in the hospital after you are discharged, you can call the unit and asked to speak with the hospitalist on call if the hospitalist that took care of you is not available. Once you are discharged, your primary care physician will handle any further medical issues. Please note that NO REFILLS for any discharge medications will be authorized once you are discharged, as it is imperative that you return to your primary care physician (or establish a relationship with a primary care physician if you do not have one) for your aftercare needs so that they can reassess your need for medications and monitor your lab values.    Allergies as of 01/14/2021      Reactions   Fentanyl Other (See Comments)   Dizziness   Other Other (See Comments)   As per father allergic to one more medicine, but don't know the name.      Medication List    STOP taking these medications   feeding supplement Liqd   guaiFENesin 100 MG/5ML Soln Commonly known as: ROBITUSSIN     TAKE these medications   Argyle Suction Catheter 14FR Misc 1 packet by Does not apply route daily.   ipratropium-albuterol 0.5-2.5 (3) MG/3ML Soln Commonly known as: DUONEB Take 3 mLs by nebulization every 6 (six) hours as needed. What changed: reasons to take this   metoprolol tartrate 25 MG tablet Commonly known as: LOPRESSOR Take 1 tablet (25 mg total) by mouth 2 (two) times daily.   multivitamin with minerals Tabs tablet Take 1 tablet by mouth daily.   ondansetron 4 MG tablet Commonly known as: ZOFRAN Take 1 tablet (4 mg total) by mouth every 6 (six) hours as needed for  nausea. What changed: when to take this   polyethylene glycol 17 g packet Commonly known as: MIRALAX / GLYCOLAX Follow package instruction for daily use.  You need to have one soft bowel movement per day.  If he is not doing this call your primary care doctor.  You also need to be sure he takes in 1.5 liters of fluid per day.   He needs protein supplement daily, and needs to take in about 1300 calories per day. What changed:   how much to take  how to take this  when to take this  reasons to take this  additional instructions   scopolamine 1 MG/3DAYS Commonly known as: TRANSDERM-SCOP Place 2 patches (3 mg total) onto the skin every 3 (three) days.   sterile water SOLN with amino acids 10 % SOLN 1.3 g/kg, dextrose 70 % SOLN 20 % Inject 1,000 mLs into the vein continuous. TPN 1000 ml w/ lipids  Directions: Infuse TPN 1026ml IV through PICC line via CADD SOLIS infusion pump daily over 18 hours with 1 hr ramp up and 1 hr ramp down. Add MVI 10 ml as directed to each bag prior to infusion. MIX WELL VTBI: 1000 ML Bag Volume  1050 ml     Dosing Weight for the patient 72 lbs            Discharge Care Instructions  (From admission, onward)         Start     Ordered   01/14/21 0000  Discharge wound care:       Comments: Continue wound dressing as before.   01/14/21 1139          Time coordinating discharge: 35 minutes  The results of significant diagnostics from this hospitalization (including imaging, microbiology, ancillary and laboratory) are listed below for reference.    Procedures and Diagnostic Studies:   CT Angio Chest PE W and/or Wo Contrast  Addendum Date: 01/12/2021   ADDENDUM REPORT: 01/12/2021 04:52 ADDENDUM: Study discussed by telephone with Dr. Roslynn Amble in the ED on 01/12/2021 at 0438 hours. And he called my attention to a more recent comparison chest CT 08/21/2020. The small left-side pneumothorax, and much of the left lung surface reticulonodular changes, are  stable since November. The small right pneumothorax is new since that time, where as the small component of right pleural effusion has regressed since November. Right lung surface reticulonodular changes have mildly increased since November, while right lower lobe peribronchial ground-glass opacity has resolved. CONCLUSION: 1. Stable left lung since November, with persistent small left pneumothorax. 2. New small right pneumothorax, but regressed right pleural effusion and right lower lobe bronchopneumonia since November. Electronically Signed   By: Genevie Ann M.D.   On: 01/12/2021 04:52   Result Date: 01/12/2021 CLINICAL DATA:  25 year old male with muscular dystrophy. Respiratory failure, shortness of breath. EXAM: CT ANGIOGRAPHY CHEST WITH CONTRAST TECHNIQUE: Multidetector CT imaging of the chest was performed using the standard protocol during bolus administration of intravenous contrast. Multiplanar CT image reconstructions and MIPs were obtained to evaluate the vascular anatomy. CONTRAST:  5mL OMNIPAQUE IOHEXOL 350 MG/ML SOLN COMPARISON:  Portable chest 2359 hours.  Chest CTA 12/01/2017. FINDINGS: Cardiovascular: Excellent contrast bolus timing in the pulmonary arterial tree. No focal filling defect identified in the pulmonary arteries to suggest acute pulmonary embolism. Right side PICC line. No cardiomegaly or pericardial effusion. Negative visible aorta. 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 No mediastinal lymphadenopathy. Hilar lymph node tissue not significantly changed. Lungs/Pleura: Small right hydropneumothorax, with pleural gas most pronounced at the anterior costophrenic angle (series 7, image 100). Small volume dependent low-density pleural fluid. Reticulonodular changes along the anterior surface of all right lung lobes, perhaps atelectasis but infection not excluded. Similar peripheral reticulonodular changes in the left lung also with trace pneumothorax (left lung  apex series 7, image 15). Small peripheral subpleural 11 mm opacity in the lateral basal segment of the left lower lobe, new since 2019. No left pleural effusion. No consolidation. Major airways are patent. Upper Abdomen: No pneumoperitoneum in the visible  upper abdomen. Visible liver, spleen, pancreas, adrenal glands, kidneys and bowel appear within normal limits. Musculoskeletal: Osteopenia with levoconvex scoliosis. No rib fracture identified. No acute osseous abnormality identified. Review of the MIP images confirms the above findings. IMPRESSION: 1. Negative for acute pulmonary embolus, but positive for Small Bilateral Pneumothoraces - greater on the right with small hydropneumothorax on that side. These might not be suitable for chest tube placement. 2. Superimposed nonspecific reticulonodular thickening along the surfaces of both lungs - possibly atelectasis but lung infection not excluded. Small nonspecific subpleural opacity also in the left lower lobe. No left pleural fluid. 3. Osteopenia.  No acute osseous abnormality identified. 4. Negative visible upper abdominal viscera. Electronically Signed: By: Genevie Ann M.D. On: 01/12/2021 04:37   DG CHEST PORT 1 VIEW  Result Date: 01/12/2021 CLINICAL DATA:  Chronic hypoxic and hypercapnic respiratory failure. EXAM: PORTABLE CHEST 1 VIEW COMPARISON:  Chest x-ray 01/11/2021 and chest CT 01/12/2021 FINDINGS: The cardiac silhouette, mediastinal and hilar contours are within normal limits and stable. The PICC line is stable. Chronic streaky interstitial scarring changes mainly in the upper lobes bilaterally. No acute superimposed pulmonary process is identified. Small left apical pneumothorax is stable. The right-sided pneumothorax seen on the chest CT is difficult to see on this chest x-ray. Gaseous distention of the bowel suggesting a diffuse ileus. IMPRESSION: 1. Stable small left apical pneumothorax. The right-sided pneumothorax seen on the chest CT is not well  demonstrated on this chest x-ray. 2. Chronic streaky interstitial scarring changes mainly in the upper lobes. Electronically Signed   By: Marijo Sanes M.D.   On: 01/12/2021 13:46   DG Chest Port 1 View  Result Date: 01/12/2021 CLINICAL DATA:  Left back pain.  Shortness of breath EXAM: PORTABLE CHEST 1 VIEW COMPARISON:  10/26/2020 FINDINGS: Right PICC line in place with the tip in the right atrium. Heart is normal size. Linear scarring in the right upper lobe. No acute confluent opacities or effusions. No acute bony abnormality. IMPRESSION: No acute cardiopulmonary disease. Electronically Signed   By: Rolm Baptise M.D.   On: 01/12/2021 00:06   US Abdomen Limited RUQ (LIVER/GB)  Result Date: 01/12/2021 CLINICAL DATA:  25 year old male with abnormal increased LFTs. Muscular dystrophy. EXAM: ULTRASOUND ABDOMEN LIMITED RIGHT UPPER QUADRANT COMPARISON:  CT Abdomen and Pelvis 06/15/2020. Ultrasound 08/21/2020 FINDINGS: Gallbladder: No gallstones or wall thickening visualized. No sonographic Murphy sign noted by sonographer. Common bile duct: Diameter: 3 mm, normal. Liver: Diminutive left hepatic lobe as demonstrated by CT. Visible liver echogenicity within normal limits (image 24). No discrete liver lesion. No intrahepatic biliary ductal dilatation identified. Portal vein is patent on color Doppler imaging with normal direction of blood flow towards the liver. Other: Negative visible right kidney. IMPRESSION: Negative right upper quadrant ultrasound. Electronically Signed   By: Genevie Ann M.D.   On: 01/12/2021 09:55     Labs:   Basic Metabolic Panel: Recent Labs  Lab 01/12/21 0124 01/13/21 0510 01/14/21 0629  NA 136 139 139  K 4.3 3.2* 4.0  CL 99 96* 101  CO2 29 29 32  GLUCOSE 108* 48* 115*  BUN 15 23* 18  CREATININE <0.30* <0.30* <0.30*  CALCIUM 8.9 9.7 8.5*  MG  --   --  1.4*  PHOS  --   --  3.2   GFR CrCl cannot be calculated (This lab value cannot be used to calculate CrCl because it is not  a number: <0.30). Liver Function Tests: Recent Labs  Lab 01/12/21  0124 01/14/21 0629  AST 57* 50*  ALT 104* 113*  ALKPHOS 141* 147*  BILITOT 0.8 1.0  PROT 7.7 7.2  ALBUMIN 3.9 3.6   No results for input(s): LIPASE, AMYLASE in the last 168 hours. No results for input(s): AMMONIA in the last 168 hours. Coagulation profile No results for input(s): INR, PROTIME in the last 168 hours.  CBC: Recent Labs  Lab 01/11/21 2315 01/13/21 0510 01/14/21 0423  WBC 6.7 14.7* 5.5  NEUTROABS 4.6  --  2.9  HGB 11.3* 12.3* 10.0*  HCT 36.7* 40.6 34.1*  MCV 83.8 82.7 87.4  PLT 142* 152 117*   Cardiac Enzymes: No results for input(s): CKTOTAL, CKMB, CKMBINDEX, TROPONINI in the last 168 hours. BNP: Invalid input(s): POCBNP CBG: Recent Labs  Lab 01/13/21 1554 01/13/21 2044 01/13/21 2346 01/14/21 0341 01/14/21 0837  GLUCAP 82 166* 148* 139* 140*   D-Dimer No results for input(s): DDIMER in the last 72 hours. Hgb A1c No results for input(s): HGBA1C in the last 72 hours. Lipid Profile Recent Labs    01/14/21 0423  TRIG 255*   Thyroid function studies No results for input(s): TSH, T4TOTAL, T3FREE, THYROIDAB in the last 72 hours.  Invalid input(s): FREET3 Anemia work up No results for input(s): VITAMINB12, FOLATE, FERRITIN, TIBC, IRON, RETICCTPCT in the last 72 hours. Microbiology Recent Results (from the past 240 hour(s))  Blood culture (routine x 2)     Status: None (Preliminary result)   Collection Time: 01/12/21  7:48 AM   Specimen: BLOOD LEFT HAND  Result Value Ref Range Status   Specimen Description   Final    BLOOD LEFT HAND Performed at [facility] [address] [place], [place] [address]    Special Requests   Final    BOTTLES DRAWN AEROBIC ONLY Blood Culture adequate volume Performed at [facility] [address], [place], [place] [address]    Culture   Final    NO GROWTH < 24 HOURS Performed at [facility] [address], [place], [place] [address]    Report Status PENDING  Incomplete  Blood culture (routine x 2)     Status: None (Preliminary result)   Collection Time: 01/12/21  7:59 AM   Specimen: BLOOD RIGHT HAND  Result Value Ref Range Status   Specimen Description   Final    BLOOD RIGHT HAND Performed at [facility] [address] [place], [place] [address]    Special Requests   Final    BOTTLES DRAWN AEROBIC ONLY Blood Culture adequate volume Performed at [facility] [address], [place], [place] [address]    Culture  Setup Time   Final    GRAM POSITIVE COCCI IN CLUSTERS AEROBIC BOTTLE ONLY Organism ID to follow CRITICAL RESULT CALLED TO, READ BACK BY AND VERIFIED WITHSeleta Rhymes [facility] 9381 01/13/21 A BROWNING Performed at [facility], [place] [address], [place], [place] [address]    Culture GRAM POSITIVE COCCI  Final   Report Status PENDING  Incomplete  Blood Culture ID Panel (Reflexed)     Status: Abnormal   Collection Time: 01/12/21  7:59 AM  Result Value Ref Range Status   Enterococcus faecalis NOT DETECTED NOT DETECTED Final   Enterococcus Faecium NOT DETECTED NOT DETECTED Final   Listeria monocytogenes NOT DETECTED NOT DETECTED Final   Staphylococcus species DETECTED (A) NOT DETECTED Final    Comment: CRITICAL RESULT CALLED TO, READ BACK BY  AND VERIFIED WITH: Seleta Rhymes [facility] 2153 01/13/21 A BROWNING    Staphylococcus aureus (BCID) NOT DETECTED NOT DETECTED Final   Staphylococcus epidermidis NOT DETECTED NOT DETECTED Final   Staphylococcus lugdunensis NOT DETECTED NOT DETECTED Final   Streptococcus species NOT DETECTED NOT DETECTED Final   Streptococcus agalactiae NOT DETECTED NOT DETECTED Final   Streptococcus pneumoniae NOT DETECTED NOT DETECTED Final   Streptococcus pyogenes NOT DETECTED NOT DETECTED Final   A.calcoaceticus-baumannii NOT DETECTED NOT DETECTED Final   Bacteroides fragilis NOT DETECTED NOT DETECTED  Final   Enterobacterales NOT DETECTED NOT DETECTED Final   Enterobacter cloacae complex NOT DETECTED NOT DETECTED Final   Escherichia coli NOT DETECTED NOT DETECTED Final   Klebsiella aerogenes NOT DETECTED NOT DETECTED Final   Klebsiella oxytoca NOT DETECTED NOT DETECTED Final   Klebsiella pneumoniae NOT DETECTED NOT DETECTED Final   Proteus species NOT DETECTED NOT DETECTED Final   Salmonella species NOT DETECTED NOT DETECTED Final   Serratia marcescens NOT DETECTED NOT DETECTED Final   Haemophilus influenzae NOT DETECTED NOT DETECTED Final   Neisseria meningitidis NOT DETECTED NOT DETECTED Final   Pseudomonas aeruginosa NOT DETECTED NOT DETECTED Final   Stenotrophomonas maltophilia NOT DETECTED NOT DETECTED Final   Candida albicans NOT DETECTED NOT DETECTED Final   Candida auris NOT DETECTED NOT DETECTED Final   Candida glabrata NOT DETECTED NOT DETECTED Final   Candida krusei NOT DETECTED NOT DETECTED Final   Candida parapsilosis NOT DETECTED NOT DETECTED Final   Candida tropicalis NOT DETECTED NOT DETECTED Final   Cryptococcus neoformans/gattii NOT DETECTED NOT DETECTED Final    Comment: Performed at [facility], [address] [place], [place] [address]  SARS CORONAVIRUS 2 (TAT 6-24 HRS) Nasopharyngeal Nasopharyngeal Swab     Status: None   Collection Time: 01/12/21  8:44 AM   Specimen: Nasopharyngeal Swab  Result Value Ref Range Status   SARS Coronavirus 2 NEGATIVE NEGATIVE Final    Comment: (NOTE) SARS-CoV-2 target nucleic acids are NOT DETECTED.  The SARS-CoV-2 RNA is generally detectable in upper and lower respiratory specimens during the acute phase of infection. Negative results do not preclude SARS-CoV-2 infection, do not rule out co-infections with other pathogens, and should not be used as the sole basis for treatment or other patient management decisions. Negative results must be combined with clinical observations, patient history, and epidemiological  information. The expected result is Negative.  Fact Sheet for Patients: SugarRoll.be  Fact Sheet for Healthcare Providers: https://www.woods-mathews.com/  This test is not yet approved or cleared by the [place] FDA and  has been authorized for detection and/or diagnosis of SARS-CoV-2 by FDA under an Emergency Use Authorization (EUA). This EUA will remain  in effect (meaning this test can be used) for the duration of the COVID-19 declaration under Se ction 564(b)(1) of the Act, 21 U.S.C. section 360bbb-3(b)(1), unless the authorization is terminated or revoked sooner.  Performed at [facility], [place] [address] [place], [place] [address]   MRSA PCR Screening     Status: None   Collection Time: 01/12/21  6:00 PM   Specimen: Nasal Mucosa; Nasopharyngeal  Result Value Ref Range Status   MRSA by PCR NEGATIVE NEGATIVE Final    Comment:        The GeneXpert MRSA Assay (FDA approved for NASAL specimens only), is one component of a comprehensive MRSA colonization surveillance program. It is not intended to diagnose MRSA infection nor to guide or monitor treatment for MRSA infections.  Performed at [facility], [place] [address], [place], [place] [address]      Signed: Marlowe Aschoff Alphonsine Minium  [facility] 01/14/2021, 11:39 AM

## 2021-01-14 NOTE — Progress Notes (Addendum)
PHARMACY - TOTAL PARENTERAL NUTRITION CONSULT NOTE   Indication: on TPN PTA  Patient Measurements: Weight: 38.1 kg (83 lb 15.9 oz)   Body mass index is 13.98 kg/m. Usual Weight: 35.4 kg  Assessment: 25 yo M on chronic TPN PTA. Pharmacy consulted to manage TPN while in hospital.   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 Hypoglycemia resolved Electrolytes: K 4 after 40 IV 40 PO K yesterday, phos 3.2, Mag low at 1.4, CoCa 8.82 Renal:  WNL/low Hepatic: mildly elevated LFTs; Trig 255; Prealbumin 8.1 Intake / Output; MIVF: urine x2 recorded;  GI Imaging: GI Surgeries / Procedures:  Hx ileus s/p diverting ostomy on cyclic TPN PTA  Central access: PICC placed 10/31/20 PTA TPN start date: on PTA  Nutritional Goals (per RD recommendation on 01/13/21): kCal: 1415-1630, Protein: 70-80, Fluid: >= 1.6 L/day Goal TPN rate is 70 mL/hr (provides 84 g of protein and 1529 kcals per day) PTA TPN: 1000 mls over 18 hrs with MVI  Current Nutrition:   Full liquids & TPN  Plan:  Now: mag 4 gm x 1  Addendum: no TPN tonight as pt discharging home today & will resume home cyclic TPN   Eudelia Bunch, Pharm.D 01/14/2021 7:57 AM

## 2021-01-16 LAB — CULTURE, BLOOD (ROUTINE X 2): Special Requests: ADEQUATE

## 2021-01-17 LAB — GLUCOSE, CAPILLARY
Glucose-Capillary: 39 mg/dL — CL (ref 70–99)
Glucose-Capillary: 43 mg/dL — CL (ref 70–99)

## 2021-01-17 LAB — CULTURE, BLOOD (ROUTINE X 2)
Culture: NO GROWTH
Special Requests: ADEQUATE

## 2021-01-19 LAB — CULTURE, BLOOD (ROUTINE X 2)
Culture: NO GROWTH
Culture: NO GROWTH

## 2021-01-27 ENCOUNTER — Emergency Department (HOSPITAL_COMMUNITY): Payer: Managed Care, Other (non HMO)

## 2021-01-27 ENCOUNTER — Other Ambulatory Visit: Payer: Self-pay

## 2021-01-27 ENCOUNTER — Inpatient Hospital Stay (HOSPITAL_COMMUNITY): Payer: Managed Care, Other (non HMO)

## 2021-01-27 ENCOUNTER — Inpatient Hospital Stay (HOSPITAL_COMMUNITY)
Admission: EM | Admit: 2021-01-27 | Discharge: 2021-02-05 | DRG: 871 | Disposition: E | Payer: Managed Care, Other (non HMO) | Attending: Internal Medicine | Admitting: Internal Medicine

## 2021-01-27 ENCOUNTER — Encounter (HOSPITAL_COMMUNITY): Payer: Self-pay

## 2021-01-27 DIAGNOSIS — Z789 Other specified health status: Secondary | ICD-10-CM | POA: Diagnosis present

## 2021-01-27 DIAGNOSIS — R64 Cachexia: Secondary | ICD-10-CM | POA: Diagnosis present

## 2021-01-27 DIAGNOSIS — J9621 Acute and chronic respiratory failure with hypoxia: Secondary | ICD-10-CM | POA: Diagnosis present

## 2021-01-27 DIAGNOSIS — R6521 Severe sepsis with septic shock: Secondary | ICD-10-CM | POA: Diagnosis present

## 2021-01-27 DIAGNOSIS — J9690 Respiratory failure, unspecified, unspecified whether with hypoxia or hypercapnia: Secondary | ICD-10-CM

## 2021-01-27 DIAGNOSIS — D638 Anemia in other chronic diseases classified elsewhere: Secondary | ICD-10-CM | POA: Diagnosis present

## 2021-01-27 DIAGNOSIS — J9612 Chronic respiratory failure with hypercapnia: Secondary | ICD-10-CM

## 2021-01-27 DIAGNOSIS — Z681 Body mass index (BMI) 19 or less, adult: Secondary | ICD-10-CM | POA: Diagnosis not present

## 2021-01-27 DIAGNOSIS — G9341 Metabolic encephalopathy: Secondary | ICD-10-CM | POA: Diagnosis present

## 2021-01-27 DIAGNOSIS — J9381 Chronic pneumothorax: Secondary | ICD-10-CM | POA: Diagnosis present

## 2021-01-27 DIAGNOSIS — Z885 Allergy status to narcotic agent status: Secondary | ICD-10-CM

## 2021-01-27 DIAGNOSIS — R0689 Other abnormalities of breathing: Secondary | ICD-10-CM

## 2021-01-27 DIAGNOSIS — J189 Pneumonia, unspecified organism: Secondary | ICD-10-CM | POA: Diagnosis present

## 2021-01-27 DIAGNOSIS — Z8701 Personal history of pneumonia (recurrent): Secondary | ICD-10-CM

## 2021-01-27 DIAGNOSIS — Z7401 Bed confinement status: Secondary | ICD-10-CM

## 2021-01-27 DIAGNOSIS — J9620 Acute and chronic respiratory failure, unspecified whether with hypoxia or hypercapnia: Secondary | ICD-10-CM

## 2021-01-27 DIAGNOSIS — E440 Moderate protein-calorie malnutrition: Secondary | ICD-10-CM | POA: Diagnosis present

## 2021-01-27 DIAGNOSIS — R5381 Other malaise: Secondary | ICD-10-CM | POA: Diagnosis present

## 2021-01-27 DIAGNOSIS — J9602 Acute respiratory failure with hypercapnia: Secondary | ICD-10-CM | POA: Diagnosis present

## 2021-01-27 DIAGNOSIS — Z20822 Contact with and (suspected) exposure to covid-19: Secondary | ICD-10-CM | POA: Diagnosis present

## 2021-01-27 DIAGNOSIS — G7100 Muscular dystrophy, unspecified: Secondary | ICD-10-CM | POA: Diagnosis present

## 2021-01-27 DIAGNOSIS — Z7189 Other specified counseling: Secondary | ICD-10-CM

## 2021-01-27 DIAGNOSIS — A419 Sepsis, unspecified organism: Principal | ICD-10-CM | POA: Diagnosis present

## 2021-01-27 DIAGNOSIS — Z933 Colostomy status: Secondary | ICD-10-CM | POA: Diagnosis not present

## 2021-01-27 DIAGNOSIS — E872 Acidosis: Secondary | ICD-10-CM | POA: Diagnosis present

## 2021-01-27 DIAGNOSIS — IMO0001 Reserved for inherently not codable concepts without codable children: Secondary | ICD-10-CM | POA: Diagnosis present

## 2021-01-27 DIAGNOSIS — G7101 Duchenne or Becker muscular dystrophy: Secondary | ICD-10-CM | POA: Diagnosis present

## 2021-01-27 DIAGNOSIS — Z79899 Other long term (current) drug therapy: Secondary | ICD-10-CM

## 2021-01-27 DIAGNOSIS — Z888 Allergy status to other drugs, medicaments and biological substances status: Secondary | ICD-10-CM | POA: Diagnosis not present

## 2021-01-27 DIAGNOSIS — R579 Shock, unspecified: Secondary | ICD-10-CM | POA: Diagnosis not present

## 2021-01-27 DIAGNOSIS — R0602 Shortness of breath: Secondary | ICD-10-CM

## 2021-01-27 DIAGNOSIS — R69 Illness, unspecified: Secondary | ICD-10-CM

## 2021-01-27 DIAGNOSIS — J9622 Acute and chronic respiratory failure with hypercapnia: Secondary | ICD-10-CM | POA: Diagnosis present

## 2021-01-27 DIAGNOSIS — D6959 Other secondary thrombocytopenia: Secondary | ICD-10-CM | POA: Diagnosis present

## 2021-01-27 DIAGNOSIS — J939 Pneumothorax, unspecified: Secondary | ICD-10-CM | POA: Diagnosis present

## 2021-01-27 DIAGNOSIS — Z66 Do not resuscitate: Secondary | ICD-10-CM

## 2021-01-27 LAB — COMPREHENSIVE METABOLIC PANEL
ALT: 36 U/L (ref 0–44)
AST: 30 U/L (ref 15–41)
Albumin: 3.7 g/dL (ref 3.5–5.0)
Alkaline Phosphatase: 122 U/L (ref 38–126)
Anion gap: 7 (ref 5–15)
BUN: 18 mg/dL (ref 6–20)
CO2: 36 mmol/L — ABNORMAL HIGH (ref 22–32)
Calcium: 8.8 mg/dL — ABNORMAL LOW (ref 8.9–10.3)
Chloride: 104 mmol/L (ref 98–111)
Creatinine, Ser: <0.3 mg/dL — ABNORMAL LOW (ref 0.61–1.24)
Glucose, Bld: UNDETERMINED mg/dL (ref 70–99)
Potassium: UNDETERMINED mmol/L (ref 3.5–5.1)
Sodium: 147 mmol/L — ABNORMAL HIGH (ref 135–145)
Total Bilirubin: 0.4 mg/dL (ref 0.3–1.2)
Total Protein: 7.7 g/dL (ref 6.5–8.1)

## 2021-01-27 LAB — CBC WITH DIFFERENTIAL/PLATELET
Abs Immature Granulocytes: 0.02 10*3/uL (ref 0.00–0.07)
Basophils Absolute: 0 10*3/uL (ref 0.0–0.1)
Basophils Relative: 1 %
Eosinophils Absolute: 0 10*3/uL (ref 0.0–0.5)
Eosinophils Relative: 0 %
HCT: 40.5 % (ref 39.0–52.0)
Hemoglobin: 11.4 g/dL — ABNORMAL LOW (ref 13.0–17.0)
Immature Granulocytes: 0 %
Lymphocytes Relative: 14 %
Lymphs Abs: 0.9 10*3/uL (ref 0.7–4.0)
MCH: 25.4 pg — ABNORMAL LOW (ref 26.0–34.0)
MCHC: 28.1 g/dL — ABNORMAL LOW (ref 30.0–36.0)
MCV: 90.2 fL (ref 80.0–100.0)
Monocytes Absolute: 1 10*3/uL (ref 0.1–1.0)
Monocytes Relative: 15 %
Neutro Abs: 4.6 10*3/uL (ref 1.7–7.7)
Neutrophils Relative %: 70 %
Platelets: 144 10*3/uL — ABNORMAL LOW (ref 150–400)
RBC: 4.49 MIL/uL (ref 4.22–5.81)
RDW: 16 % — ABNORMAL HIGH (ref 11.5–15.5)
WBC: 6.5 10*3/uL (ref 4.0–10.5)
nRBC: 0 % (ref 0.0–0.2)

## 2021-01-27 LAB — GLUCOSE, CAPILLARY
Glucose-Capillary: 185 mg/dL — ABNORMAL HIGH (ref 70–99)
Glucose-Capillary: 170 mg/dL — ABNORMAL HIGH (ref 70–99)
Glucose-Capillary: 183 mg/dL — ABNORMAL HIGH (ref 70–99)
Glucose-Capillary: 69 mg/dL — ABNORMAL LOW (ref 70–99)
Glucose-Capillary: 163 mg/dL — ABNORMAL HIGH (ref 70–99)
Glucose-Capillary: 167 mg/dL — ABNORMAL HIGH (ref 70–99)

## 2021-01-27 LAB — RESP PANEL BY RT-PCR (FLU A&B, COVID) ARPGX2
Influenza A by PCR: NEGATIVE
Influenza B by PCR: NEGATIVE
SARS Coronavirus 2 by RT PCR: NEGATIVE

## 2021-01-27 LAB — BLOOD GAS, VENOUS
Acid-Base Excess: 4.3 mmol/L — ABNORMAL HIGH (ref 0.0–2.0)
Acid-Base Excess: 3.9 mmol/L — ABNORMAL HIGH (ref 0.0–2.0)
Bicarbonate: 35.1 mmol/L — ABNORMAL HIGH (ref 20.0–28.0)
Bicarbonate: 36.4 mmol/L — ABNORMAL HIGH (ref 20.0–28.0)
FIO2: 21
O2 Saturation: 85.7 %
O2 Saturation: 86.9 %
Patient temperature: 98.6
Patient temperature: 98.6
pCO2, Ven: 97.5 mmHg (ref 44.0–60.0)
pCO2, Ven: >120 mmHg (ref 44.0–60.0)
pH, Ven: 7.182 — CL (ref 7.250–7.430)
pH, Ven: 7.105 — CL (ref 7.250–7.430)
pO2, Ven: 58.8 mmHg — ABNORMAL HIGH (ref 32.0–45.0)
pO2, Ven: 66.4 mmHg — ABNORMAL HIGH (ref 32.0–45.0)

## 2021-01-27 LAB — BASIC METABOLIC PANEL
Anion gap: 6 (ref 5–15)
BUN: 16 mg/dL (ref 6–20)
CO2: 36 mmol/L — ABNORMAL HIGH (ref 22–32)
Calcium: 8.3 mg/dL — ABNORMAL LOW (ref 8.9–10.3)
Chloride: 110 mmol/L (ref 98–111)
Creatinine, Ser: <0.3 mg/dL — ABNORMAL LOW (ref 0.61–1.24)
Glucose, Bld: 154 mg/dL — ABNORMAL HIGH (ref 70–99)
Potassium: 3.8 mmol/L (ref 3.5–5.1)
Sodium: 152 mmol/L — ABNORMAL HIGH (ref 135–145)

## 2021-01-27 LAB — BLOOD GAS, ARTERIAL
Acid-Base Excess: 3.5 mmol/L — ABNORMAL HIGH (ref 0.0–2.0)
Acid-Base Excess: 6.3 mmol/L — ABNORMAL HIGH (ref 0.0–2.0)
Bicarbonate: 42.1 mmol/L — ABNORMAL HIGH (ref 20.0–28.0)
Bicarbonate: 42.4 mmol/L — ABNORMAL HIGH (ref 20.0–28.0)
Drawn by: 560031
FIO2: 100
O2 Content: 15 L/min
O2 Saturation: 99.4 %
O2 Saturation: 99.6 %
Patient temperature: 98.6
Patient temperature: 97.5
pCO2 arterial: >120 mmHg (ref 32.0–48.0)
pCO2 arterial: >120 mmHg (ref 32.0–48.0)
pH, Arterial: 6.959 — CL (ref 7.350–7.450)
pH, Arterial: 7.053 — CL (ref 7.350–7.450)
pO2, Arterial: 328 mmHg — ABNORMAL HIGH (ref 83.0–108.0)
pO2, Arterial: 265 mmHg — ABNORMAL HIGH (ref 83.0–108.0)

## 2021-01-27 LAB — URINALYSIS, ROUTINE W REFLEX MICROSCOPIC
Bilirubin Urine: NEGATIVE
Glucose, UA: NEGATIVE mg/dL
Hgb urine dipstick: NEGATIVE
Ketones, ur: NEGATIVE mg/dL
Leukocytes,Ua: NEGATIVE
Nitrite: NEGATIVE
Protein, ur: NEGATIVE mg/dL
Specific Gravity, Urine: 1.019 (ref 1.005–1.030)
pH: 6 (ref 5.0–8.0)

## 2021-01-27 LAB — LACTIC ACID, PLASMA
Lactic Acid, Venous: 0.5 mmol/L (ref 0.5–1.9)
Lactic Acid, Venous: 0.7 mmol/L (ref 0.5–1.9)

## 2021-01-27 LAB — MAGNESIUM: Magnesium: 1.7 mg/dL (ref 1.7–2.4)

## 2021-01-27 LAB — CBG MONITORING, ED: Glucose-Capillary: 132 mg/dL — ABNORMAL HIGH (ref 70–99)

## 2021-01-27 LAB — PHOSPHORUS: Phosphorus: 3.6 mg/dL (ref 2.5–4.6)

## 2021-01-27 LAB — MRSA PCR SCREENING: MRSA by PCR: NEGATIVE

## 2021-01-27 MED ORDER — INSULIN ASPART 100 UNIT/ML ~~LOC~~ SOLN
0.0000 [IU] | SUBCUTANEOUS | Status: DC
  Administered 2021-01-27 – 2021-01-28 (×5): 2 [IU] via SUBCUTANEOUS
  Administered 2021-01-28: 1 [IU] via SUBCUTANEOUS
  Filled 2021-01-27: qty 0.09

## 2021-01-27 MED ORDER — MAGNESIUM SULFATE 2 GM/50ML IV SOLN
2.0000 g | Freq: Once | INTRAVENOUS | Status: AC
  Administered 2021-01-27: 2 g via INTRAVENOUS
  Filled 2021-01-27: qty 50

## 2021-01-27 MED ORDER — SODIUM CHLORIDE 0.9% FLUSH: 10.0000 mL | INTRAVENOUS | Status: DC | PRN

## 2021-01-27 MED ORDER — SODIUM CHLORIDE 0.9% FLUSH
10.0000 mL | Freq: Two times a day (BID) | INTRAVENOUS | Status: DC
Start: 2021-01-27 — End: 2021-01-29
  Administered 2021-01-27: 10 mL
  Administered 2021-01-28: 20 mL

## 2021-01-27 MED ORDER — ACETAMINOPHEN 325 MG PO TABS: 650.0000 mg | ORAL_TABLET | Freq: Four times a day (QID) | ORAL | Status: DC | PRN

## 2021-01-27 MED ORDER — ORAL CARE MOUTH RINSE
15.0000 mL | Freq: Two times a day (BID) | OROMUCOSAL | Status: DC
  Administered 2021-01-27 (×2): 15 mL via OROMUCOSAL

## 2021-01-27 MED ORDER — SODIUM CHLORIDE 0.9 % IV SOLN: INTRAVENOUS | Status: DC

## 2021-01-27 MED ORDER — CHLORHEXIDINE GLUCONATE 0.12 % MT SOLN
15.0000 mL | Freq: Two times a day (BID) | OROMUCOSAL | Status: DC
  Filled 2021-01-27: qty 15

## 2021-01-27 MED ORDER — CHLORHEXIDINE GLUCONATE CLOTH 2 % EX PADS
6.0000 | MEDICATED_PAD | Freq: Every day | CUTANEOUS | Status: DC
  Administered 2021-01-27: 6 via TOPICAL

## 2021-01-27 MED ORDER — IOHEXOL 350 MG/ML SOLN
100.0000 mL | Freq: Once | INTRAVENOUS | Status: AC | PRN
  Administered 2021-01-27: 75 mL via INTRAVENOUS

## 2021-01-27 MED ORDER — ACETAMINOPHEN 650 MG RE SUPP: 650.0000 mg | Freq: Four times a day (QID) | RECTAL | Status: DC | PRN

## 2021-01-27 MED ORDER — SODIUM CHLORIDE 0.9 % IV SOLN: INTRAVENOUS | Status: AC

## 2021-01-27 MED ORDER — DEXTROSE 50 % IV SOLN
12.5000 g | Freq: Once | INTRAVENOUS | Status: AC
  Administered 2021-01-27: 12.5 g via INTRAVENOUS
  Filled 2021-01-27: qty 50

## 2021-01-27 MED ORDER — TRAVASOL 10 % IV SOLN
INTRAVENOUS | Status: AC
  Filled 2021-01-27: qty 840

## 2021-01-27 NOTE — Progress Notes (Signed)
IV attempted with [place] and unsuccessful. Will request 2nd assess from day VAST RN. Discussed PICC and home TPN with RN; recommend follow up with MD for TPN/D5W orders while pt is in hospital

## 2021-01-27 NOTE — ED Notes (Signed)
Respiratory Therapist, Lawerance Bach called and coming to put patient on ED Bipap.

## 2021-01-27 NOTE — ED Notes (Signed)
IV team consult in place. 2 RN attempted to draw blood with unsuccessful attempt.

## 2021-01-27 NOTE — Plan of Care (Signed)
RN will continue to monitor patient's progression of care plan.  

## 2021-01-27 NOTE — Consult Note (Signed)
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 Recently admitted 4/7 - 4/9 for left back pain, found to have pneumothorax. Of note, patient is a Jehovah's witness and does not wish to receive any blood products.  On presentation to ED, patient's initial respiratory status was stable (sats 96% on 3L BiPAP, uses at home). Initial VBG demonstrated pH 7.182, PCO2 97.5 and patient was transitioned from home to ED BiPAP. Repeat VBG was slightly worsened and ABG was obtained, demonstrating severe respiratory acidosis with pH 6.95/pCO2 >120/pO2 328/Bicarb 42.1.  After discussion with patient/family, ED provider reported family's desire for partial code status with DNI order placed. PCCM was consulted for further recommendations and management.  Pertinent Medical History:   Past Medical History:  Diagnosis Date  . Acute respiratory failure with hypoxia and hypercapnia ([place]) 05/16/2018  . HCAP (healthcare-associated pneumonia) 12/01/2017  . High anion gap metabolic acidosis 7/67/3419  . Hypotension   . Ileus ([place]) 09/23/2017  . Leukocytosis 05/05/2016  . MD (muscular dystrophy) ([place])   . Refusal of blood product    patient is Peter Hayes witness  . Respiratory alkalosis 05/05/2016  . Sepsis ([place]) 05/05/2016  . Severe sepsis ([place]) 09/16/2019  . Sinus tachycardia 05/05/2016   Significant Hospital Events: Including procedures, antibiotic start and stop dates in addition to other pertinent events   . 4/22 Presented to [facility] ED with several day  history of fatigue/increasing weakness. O2 sats initially 96% on 3L BiPAP (home machine); increasingly worse hypercarbic respiratory failure in ED prompting intubation inquiry; patient family wished for partial code/DNI order at this time. Transferred to ICU.  Interim History / Subjective:    Objective   Blood pressure 111/73, pulse (!) 123, temperature 98.4 F (36.9 C), temperature source Axillary, resp. rate (!) 24, height $RemoveBe'5\' 5"'jnhKHFeNI$  (1.651 m), weight 38.1 kg, SpO2 100 %.    Vent Mode: Other (Comment) Set Rate:  [5 bmp] 5 bmp Vt Set:  [370 mL] 370 mL  No intake or output data in the 24 hours ending 02/04/2021 0952 Filed Weights   01/25/2021 0330  Weight: 38.1 kg   Physical Examination: General: Chronically ill-appearing young man in NAD. HEENT: [place]/AT, anicteric sclera, PERRL, moist mucous membranes. Neuro: Lethargic. Responds to verbal stimuli. Cannot follow commands 2/2 profound neurologic debility. CV: RRR, no m/g/r. PULM: Breathing even and mildly labored on BiPAP (100% O2). Lung fields CTAB, poor respiratory effort 2/2 weakness. GI: Soft, nontender, nondistended. Normoactive bowel sounds. Extremities: No LE edema noted. +Contracted Skin: Warm/dry, no rashes.  Labs/imaging that I have personally reviewed:  (right click and "Reselect all SmartList Selections" daily)  WBC 6.5 (5.5) H&H stable 11.4/40.5 Plt 144 (117)  Na 152 (147), K 3.8, Bicarb 36 Cr < 0.3, BUN 16  UA unremarkable  VBG 7.125/pCO2 97.5 VBG 7.105/pCO2 120 ABG 6.959/pCO2 >120/pO2 328/Bicarb 42.1  COVID/Flu negative  CXR with small chronic L apical PTX  CTA Chest stable from 2 weeks prior, small bilateral PTX with biapical pleural-based scarring, negative for PE  Resolved Hospital Problem list     Assessment & Plan:  Acute-on-chronic hypoxic and hypercarbic respiratory failure  with severe respiratory acidosis Presented to [facility] ED 4/22AM with increasing fatigue and weakness for several days - patient does  utilize BiPAP regularly at home for chronic respiratory failure. O2 sats/respiratory status initially stable on presentation to ED, however patient became increasingly acidotic to pH 6.95 with rising pCO2 despite BiPAP. Per ED provider, discussion re: code status with patient's family occurred in the setting of anticipated need for intubation. Patient's family desired a DNI order, given patient's chronic respiratory issues and likely inability to wean from the ventilator in the event of intubation, but requested that all other resuscitative measures be pursued including CPR. Family was made aware of patient's severe respiratory acidosis and guarded prognosis. - Continue support with BiPAP - DNI, per patient/family wishes - Given patient's chronic respiratory issues which appear to be worsening, severe muscular dystrophy likely contributing to worsening respiratory function and now DNI status, recommend Palliative Care at this time as patient's clinical status will likely continue to worsen  Recurrent pneumothorax Recently admitted 4/7- 4/9 for back pain, found to have pneumothorax. Known history of recurrent pnemothorax. CTA Chest during that admission revealed small bilateral apical pneumothoraces with biapical pleural-based scarring; CXR this admission with small chronic L apical pneumothorax.  Severe muscular dystrophy with resultant profound disability  Best practice (right click and "Reselect all SmartList Selections" daily)  Diet:  NPO - TPN Pain/Anxiety/Delirium protocol (if indicated): Yes (RASS goal 0) VAP protocol (if indicated): Not indicated DVT prophylaxis: Subcutaneous Heparin and SCD GI prophylaxis: N/A Glucose control:  SSI Yes Central venous access:  N/A Arterial line:  N/A Foley:  N/A Mobility:  bed rest  PT consulted: N/A Last date of multidisciplinary goals of care discussion [4/22] Code Status:  limited - DNI desired by patient family, would like to pursue any other  measures including CPR Disposition: ICU  Labs:  CBC: Recent Labs  Lab 01/21/2021 0407  WBC 6.5  NEUTROABS 4.6  HGB 11.4*  HCT 40.5  MCV 90.2  PLT 498*   Basic Metabolic Panel: Recent Labs  Lab 01/12/2021 0458 02/04/2021 0800  NA 147* 152*  K SPECIMEN CONTAMINATED, UNABLE TO PERFORM TEST(S). 3.8  CL 104 110  CO2 36* 36*  GLUCOSE SPECIMEN CONTAMINATED, UNABLE TO PERFORM TEST(S). 154*  BUN 18 16  CREATININE <0.30* <0.30*  CALCIUM 8.8* 8.3*  MG  --  1.7  PHOS  --  3.6   GFR: CrCl cannot be calculated (This lab value cannot be used to calculate CrCl because it is not a number: <0.30). Recent Labs  Lab 01/31/2021 0407 01/12/2021 0458  WBC 6.5  --   LATICACIDVEN 0.5 0.7   Liver Function Tests: Recent Labs  Lab 02/02/2021 0458  AST 30  ALT 36  ALKPHOS 122  BILITOT 0.4  PROT 7.7  ALBUMIN 3.7   No results for input(s): LIPASE, AMYLASE in the last 168 hours. No results for input(s): AMMONIA in the last 168 hours.  ABG:    Component Value Date/Time   PHART 6.959 (LL) 01/20/2021 0847   PCO2ART >120.0 (HH) 01/08/2021 0847   PO2ART 328 (H) 01/12/2021 0847   HCO3 42.1 (H) 01/09/2021 0847   TCO2 41 (H) 09/15/2019 1903   ACIDBASEDEF 16.0 (H) 05/05/2016 0316   O2SAT 99.4 02/01/2021 0847    Coagulation Profile: No results for input(s): INR, PROTIME in the last 168 hours.  Cardiac Enzymes: No results for input(s): CKTOTAL, CKMB, CKMBINDEX, TROPONINI in the last 168 hours.  HbA1C: Hgb A1c MFr Bld  Date/Time Value Ref Range  Status  10/29/2020 02:49 AM 4.6 (L) 4.8 - 5.6 % Final    Comment:    (NOTE) Pre diabetes:          5.7%-6.4%  Diabetes:              >6.4%  Glycemic control for   <7.0% adults with diabetes    CBG: Recent Labs  Lab 01/24/2021 0606  GLUCAP 132*   Review of Systems:   Review of systems completed with pertinent positives/negatives outlined in above HPI.  Past Medical History:  He,  has a past medical history of Acute respiratory failure with  hypoxia and hypercapnia ([place]) (05/16/2018), HCAP (healthcare-associated pneumonia) (12/01/2017), High anion gap metabolic acidosis (7/48/2707), Hypotension, Ileus ([place]) (09/23/2017), Leukocytosis (05/05/2016), MD (muscular dystrophy) ([place]), Refusal of blood product, Respiratory alkalosis (05/05/2016), Sepsis ([place]) (05/05/2016), Severe sepsis ([place]) (09/16/2019), and Sinus tachycardia (05/05/2016).   Surgical History:   Past Surgical History:  Procedure Laterality Date  . COLOSTOMY    . EYE SURGERY    . FLEXIBLE SIGMOIDOSCOPY N/A 01/10/2018   Procedure: FLEXIBLE SIGMOIDOSCOPY;  Surgeon: Laurence Spates, MD;  Location: [facility] ENDOSCOPY;  Service: Endoscopy;  Laterality: N/A;  . SMALL INTESTINE SURGERY       Social History:   reports that he has never smoked. He has never used smokeless tobacco. He reports that he does not drink alcohol and does not use drugs.   Family History:  His family history is not on file.   Allergies Allergies  Allergen Reactions  . Fentanyl Other (See Comments)    Dizziness  . Other Other (See Comments)    As per father allergic to one more medicine, but don't know the name.    Home Medications: Prior to Admission medications   Medication Sig Start Date End Date Taking? Authorizing Provider  Catheters (ARGYLE SUCTION CATHETER 14FR) MISC 1 packet by Does not apply route daily. 08/27/20  Yes Charlynne Cousins, MD  ipratropium-albuterol (DUONEB) 0.5-2.5 (3) MG/3ML SOLN Take 3 mLs by nebulization every 6 (six) hours as needed. Patient taking differently: Take 3 mLs by nebulization every 6 (six) hours as needed (breathing / congestion). 09/21/19  Yes Harold Hedge, MD  metoprolol tartrate (LOPRESSOR) 25 MG tablet Take 1 tablet (25 mg total) by mouth 2 (two) times daily. Patient taking differently: Take 25 mg by mouth See admin instructions. Per Father 1 tablet three times daily 11/01/20  Yes Swayze, Ava, DO  Multiple Vitamin (MULTIVITAMIN WITH MINERALS) TABS tablet Take 1  tablet by mouth daily.   Yes [provider]  ondansetron (ZOFRAN) 4 MG tablet Take 1 tablet (4 mg total) by mouth every 6 (six) hours as needed for nausea. Patient taking differently: Take 4 mg by mouth every 8 (eight) hours as needed for nausea. 06/21/20  Yes Nita Sells, MD  polyethylene glycol (MIRALAX / Floria Raveling) packet Follow package instruction for daily use.  You need to have one soft bowel movement per day.  If he is not doing this call your primary care doctor.  You also need to be sure he takes in 1.5 liters of fluid per day.   He needs protein supplement daily, and needs to take in about 1300 calories per day. Patient taking differently: Take 17 g by mouth daily as needed for moderate constipation. 01/15/18  Yes Earnstine Regal, PA-C  scopolamine (TRANSDERM-SCOP) 1 MG/3DAYS Place 2 patches (3 mg total) onto the skin every 3 (three) days. 11/02/20  Yes Swayze, Ava, DO  sterile water SOLN  with amino acids 10 % SOLN 1.3 g/kg, dextrose 70 % SOLN 20 % Inject 1,000 mLs into the vein continuous. TPN 1000 ml w/ lipids  Directions: Infuse TPN 1059ml IV through PICC line via CADD SOLIS infusion pump daily over 18 hours with 1 hr ramp up and 1 hr ramp down. Add MVI 10 ml as directed to each bag prior to infusion. MIX WELL VTBI: 1000 ML Bag Volume  1050 ml     Dosing Weight for the patient 72 lbs   Yes [provider]    Critical care time: 64 minutes   Lestine Mount, PA-C [facility] 01/26/2021 9:53 AM  Please see [email or web address] for pager details.

## 2021-01-27 NOTE — ED Notes (Signed)
PH 7.182 PCo2 97.5

## 2021-01-27 NOTE — Progress Notes (Signed)
RT NOTE:  RT called to pt room due to low sats and pt being unresponsive. RT took over bagging pt and pt sats came back up into the 90s. MD verbal order to NTS for possible mucus plug. RT obtained a small amount of yellow/white, thick mucus out of pt airway. Pt was placed on a NRB at 15L per MD. ABG ordered and obtained by RT, sample was sent to lab and lab was notified. Pt was placed back on the BiPAP machine with hospitals BiPAP mask instead of his home BiPAP mask that he was on previously. RT will continue to monitor pt status.

## 2021-01-27 NOTE — Progress Notes (Signed)
RT NOTE:  Pt transported to ICU via BiPAP machine with no complications noted. RT will continue to monitor pt status.

## 2021-01-27 NOTE — Progress Notes (Addendum)
PHARMACY - TOTAL PARENTERAL NUTRITION CONSULT NOTE   Indication: Prolonged ileus, chronic home TPN  Patient Measurements: Height: 5\' 5"  (165.1 cm) Weight: 38.1 kg (83 lb 15.9 oz) IBW/kg (Calculated) : 61.5 TPN AdjBW (KG): 38.1 Body mass index is 13.98 kg/m. Usual Weight: 38.1 kg  Assessment: 25 yo M on chronic TPN PTA. Pharmacy consulted to manage TPN while in hospital. 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 Recently admitted 4/6-4/9.   Glucose / Insulin: No hx DM.  Electrolytes: Na elevated (152), CO2 elevated. Others WNL. Renal: SCr low, BUN wnl. Hepatic: LFTs wnl Intake / Output; MIVF: NS at 125 ml/hr GI Imaging: GI Surgeries / Procedures:  Hx ileus s/p diverting ostomy on cyclic TPN PTA  Central access: PICC 1/24 PTA TPN start date: on PTA  Nutritional Goals (per RD recommendation on 4/8): Kcal:  1415-1630 kcal, Protein:  70-80 grams, Fluid:  >/= 1.6 L/day Goal TPN rate is 70 mL/hr (provides 84 g of protein and 1414 kcals per day)  Current Nutrition:  NPO  Home TPN provides 999 kcal, 54 grams protein per day. Receives 1000 ml over 18hr per day with 1hr ramp up and ramp down  Plan:  Continue TPN, will use continuous rate while inpatient at 70 mL/hr at 1800 Electrolytes in TPN:   Na 78mEq/L  K 70mEq/L   Ca 50mEq/L (none in home formula)  Mg 9mEq/L  Phos 57mmol/L  Cl:Ac 1:1 Add standard MVI and trace elements to TPN Initiate Sensitive q4h SSI and adjust as needed  MIVF decrease to 37ml/hr when TPN starts to maintain 125 ml/hr Monitor TPN labs on Mon/Thurs, full lab panel in AM  Peggyann Juba, PharmD, [place]: [phone] 02/01/2021,8:37 AM

## 2021-01-27 NOTE — Progress Notes (Signed)
Initial Nutrition Assessment  DOCUMENTATION CODES:   Underweight  INTERVENTION:  - TPN per Pharmacist. - monitor for ability to advance diet.    NUTRITION DIAGNOSIS:   Inadequate oral intake related to chronic illness,altered GI function as evidenced by other (comment) (need for TPN to meet needs).  GOAL:   Patient will meet greater than or equal to 90% of their needs  MONITOR:   Diet advancement,Labs,Weight trends,Other (Comment) (TPN regimen)  REASON FOR ASSESSMENT:   Consult New TPN/TNA  ASSESSMENT:   25 y.o. male Jehovah's Witness with medical history of muscular dystrophy, bedbound status, chronic respiratory failure dependent on BiPAP, chronic stable pneumothorax, ileus s/p diverting ostomy now chronically on home TPN, recurrent PNA. He presented to the ED d/t SOB. In the ED his dad denied him having any N/V or fevers PTA. He had an episode of unresponsiveness in the ED.  Patient is well-known to this RD from previous hospitalizations. Family at bedside.   Able to talk with RN and Pharmacist concerning patient, plan for nutrition, and recommendations for TPN should diet unable to be advanced.   Patient receives TPN at home which provides 1000 kcal (71% estimated kcal need) and 54 grams protein (77% estimated protein need) per day. TPN runs x18 hours/day at home.   Weight today is 76.5 lb and weight on 4/9 was 84 lb. This indicates 7.5% body weight loss (9% body weight) in the past 2 weeks. Noted patient on high rate IV fluids with mild hypernatremia. Will monitor wt closely given likely dehydration.    Labs reviewed; CBGs: 132, 185, 170 mg/dl, Na: 152 mmol/l, creatinine: <0.3 mg/dl, Ca: 8.3 mg/dl Medications reviewed; sliding scale novolog, 2 g IV Mg sulfate x1 run 4/22. IVF; NS @ 125 ml/hr.    NUTRITION - FOCUSED PHYSICAL EXAM:  not appropriate for patient with muscular dystrophy  Diet Order:   Diet Order            Diet NPO time specified  Diet effective  now                 EDUCATION NEEDS:   No education needs have been identified at this time  Skin:  Skin Assessment: Reviewed RN Assessment  Last BM:  PTA/unknown  Height:   Ht Readings from Last 1 Encounters:  01/25/2021 $RemoveB'5\' 5"'wQfNzUzH$  (1.651 m)    Weight:   Wt Readings from Last 1 Encounters:  01/20/2021 34.7 kg    Estimated Nutritional Needs:  Kcal:  1415-1630 kcal Protein:  70-80 grams protein Fluid:  >/= 1.5 L/day      Jarome Matin, MS, RD, LDN, CNSC Inpatient Clinical Dietitian RD pager # available in AMION  After hours/weekend pager # available in [facility]

## 2021-01-27 NOTE — Progress Notes (Signed)
Transported to CT and back to ER room without event.

## 2021-01-27 NOTE — ED Provider Notes (Signed)
[facility] DEPT Provider Note   CSN: [id number] Arrival date & time: 01/30/2021  9371     History Chief Complaint  Patient presents with  . Fatigue         Peter Hayes is a 25 y.o. male.  The history is provided by a relative, the EMS personnel and medical records. The history is limited by the condition of the patient.  Shortness of Breath Severity:  Moderate Onset quality:  Gradual Timing:  Constant Progression:  Worsening Chronicity:  Recurrent Context: not weather changes   Relieved by:  Nothing Worsened by:  Nothing Ineffective treatments:  None tried Associated symptoms: no chest pain, no fever, no rash, no swollen glands, no vomiting and no wheezing   Associated symptoms comment:  Generalized weakness  Risk factors: no recent surgery        Past Medical History:  Diagnosis Date  . Acute respiratory failure with hypoxia and hypercapnia ([place]) 05/16/2018  . HCAP (healthcare-associated pneumonia) 12/01/2017  . High anion gap metabolic acidosis 6/96/7893  . Hypotension   . Ileus ([place]) 09/23/2017  . Leukocytosis 05/05/2016  . MD (muscular dystrophy) ([place])   . Refusal of blood product    patient is Peter Hayes witness  . Respiratory alkalosis 05/05/2016  . Sepsis ([place]) 05/05/2016  . Severe sepsis ([place]) 09/16/2019  . Sinus tachycardia 05/05/2016    Patient Active Problem List   Diagnosis Date Noted  . Pneumothorax on right 01/12/2021  . Pneumothorax 01/12/2021  . Staphylococcus aureus bacteremia 10/27/2020  . On total parenteral nutrition (TPN) 10/26/2020  . Bacteremia due to coagulase-negative Staphylococcus 10/26/2020  . Coag negative Staphylococcus bacteremia 08/21/2020  . Abnormal LFTs 08/21/2020  . Acute metabolic encephalopathy 81/10/7508  . Pneumothorax on left 08/21/2020  . Sepsis ([place]) 06/14/2020  . Pneumomediastinum ([place]) 02/06/2020  . Pressure injury of skin 09/17/2019  . 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 Hypoglycemia without diagnosis of diabetes mellitus 09/24/2017  . Muscular dystrophy ([place]) 05/05/2016  . Moderate protein-calorie malnutrition ([place]) 05/05/2016    Past Surgical History:  Procedure Laterality Date  . COLOSTOMY    . EYE SURGERY    . FLEXIBLE SIGMOIDOSCOPY N/A 01/10/2018   Procedure: FLEXIBLE SIGMOIDOSCOPY;  Surgeon: Laurence Spates, MD;  Location: [facility];  Service: Endoscopy;  Laterality: N/A;  . SMALL INTESTINE SURGERY         History reviewed. No pertinent family history.  Social History   Tobacco Use  . Smoking status: Never Smoker  . Smokeless tobacco: Never Used  Vaping Use  . Vaping Use: Never used  Substance Use Topics  . Alcohol use: No  . Drug use: No    Home Medications Prior to Admission medications   Medication Sig Start Date End Date Taking? Authorizing Provider  Catheters (ARGYLE SUCTION CATHETER 14FR) MISC 1 packet by Does not apply route daily. 08/27/20  Yes Charlynne Cousins, MD  ipratropium-albuterol (DUONEB) 0.5-2.5 (3) MG/3ML SOLN Take 3 mLs by nebulization every 6 (six) hours as needed. Patient taking differently: Take 3 mLs by nebulization every 6 (six) hours as needed (breathing / congestion). 09/21/19  Yes Harold Hedge, MD  metoprolol tartrate (LOPRESSOR) 25 MG tablet Take 1 tablet (25 mg total) by mouth 2 (two) times daily. Patient taking differently: Take 25 mg by mouth See admin instructions. Per Father 1 tablet three times daily 11/01/20  Yes Swayze, Ava, DO  Multiple Vitamin (MULTIVITAMIN WITH MINERALS) TABS tablet Take 1 tablet by  mouth daily.   Yes [provider]  ondansetron (ZOFRAN) 4 MG tablet Take 1 tablet (4 mg total) by mouth every 6 (six) hours as needed for nausea. Patient taking differently: Take 4 mg by mouth every 8 (eight) hours as needed for nausea. 06/21/20  Yes Nita Sells, MD  polyethylene glycol (MIRALAX / Floria Raveling) packet Follow package instruction for daily use.  You need  to have one soft bowel movement per day.  If he is not doing this call your primary care doctor.  You also need to be sure he takes in 1.5 liters of fluid per day.   He needs protein supplement daily, and needs to take in about 1300 calories per day. Patient taking differently: Take 17 g by mouth daily as needed for moderate constipation. 01/15/18  Yes Earnstine Regal, PA-C  scopolamine (TRANSDERM-SCOP) 1 MG/3DAYS Place 2 patches (3 mg total) onto the skin every 3 (three) days. 11/02/20  Yes Swayze, Ava, DO  sterile water SOLN with amino acids 10 % SOLN 1.3 g/kg, dextrose 70 % SOLN 20 % Inject 1,000 mLs into the vein continuous. TPN 1000 ml w/ lipids  Directions: Infuse TPN 1050ml IV through PICC line via CADD SOLIS infusion pump daily over 18 hours with 1 hr ramp up and 1 hr ramp down. Add MVI 10 ml as directed to each bag prior to infusion. MIX WELL VTBI: 1000 ML Bag Volume  1050 ml     Dosing Weight for the patient 72 lbs   Yes [provider]    Allergies    Fentanyl and Other  Review of Systems   Review of Systems  Constitutional: Positive for fatigue. Negative for fever.       Decreased TPN flow   HENT: Negative for congestion.   Eyes: Negative for visual disturbance.  Respiratory: Positive for shortness of breath. Negative for wheezing.   Cardiovascular: Negative for chest pain.  Gastrointestinal: Negative for diarrhea and vomiting.  Genitourinary: Negative for dysuria.  Musculoskeletal: Negative for neck stiffness.  Skin: Negative for rash.  Neurological: Negative for syncope.  Psychiatric/Behavioral: Negative for agitation.    Physical Exam Updated Vital Signs BP 115/75   Pulse 100   Temp 98.4 F (36.9 C) (Axillary)   Resp 18   Ht $R'5\' 5"'zb$  (1.651 m)   Wt 38.1 kg   SpO2 97%   BMI 13.98 kg/m   Physical Exam Vitals and nursing note reviewed.  Constitutional:      Appearance: Normal appearance. He is not diaphoretic.  HENT:     Head: Normocephalic and  atraumatic.     Nose: Nose normal.  Eyes:     Extraocular Movements: Extraocular movements intact.     Conjunctiva/sclera: Conjunctivae normal.     Pupils: Pupils are equal, round, and reactive to light.  Cardiovascular:     Rate and Rhythm: Regular rhythm. Tachycardia present.     Pulses: Normal pulses.     Heart sounds: Normal heart sounds.  Pulmonary:     Effort: Pulmonary effort is normal.     Breath sounds: Normal breath sounds.  Abdominal:     General: Abdomen is flat. Bowel sounds are normal.     Palpations: Abdomen is soft.     Tenderness: There is no abdominal tenderness. There is no guarding.  Musculoskeletal:        General: Normal range of motion.     Cervical back: Normal range of motion and neck supple.  Skin:    General: Skin  is warm and dry.     Capillary Refill: Capillary refill takes less than 2 seconds.  Neurological:     General: No focal deficit present.     Mental Status: He is alert.  Psychiatric:        Mood and Affect: Mood normal.        Behavior: Behavior normal.     ED Results / Procedures / Treatments   Labs (all labs ordered are listed, but only abnormal results are displayed) Results for orders placed or performed during the hospital encounter of 01/20/2021  Resp Panel by RT-PCR (Flu A&B, Covid) Nasopharyngeal Swab   Specimen: Nasopharyngeal Swab; Nasopharyngeal(NP) swabs in vial transport medium  Result Value Ref Range   SARS Coronavirus 2 by RT PCR NEGATIVE NEGATIVE   Influenza A by PCR NEGATIVE NEGATIVE   Influenza B by PCR NEGATIVE NEGATIVE  CBC with Differential/Platelet  Result Value Ref Range   WBC 6.5 4.0 - 10.5 K/uL   RBC 4.49 4.22 - 5.81 MIL/uL   Hemoglobin 11.4 (L) 13.0 - 17.0 g/dL   HCT 44.3 60.1 - 65.8 %   MCV 90.2 80.0 - 100.0 fL   MCH 25.4 (L) 26.0 - 34.0 pg   MCHC 28.1 (L) 30.0 - 36.0 g/dL   RDW 00.6 (H) 34.9 - 49.4 %   Platelets 144 (L) 150 - 400 K/uL   nRBC 0.0 0.0 - 0.2 %   Neutrophils Relative % 70 %   Neutro Abs  4.6 1.7 - 7.7 K/uL   Lymphocytes Relative 14 %   Lymphs Abs 0.9 0.7 - 4.0 K/uL   Monocytes Relative 15 %   Monocytes Absolute 1.0 0.1 - 1.0 K/uL   Eosinophils Relative 0 %   Eosinophils Absolute 0.0 0.0 - 0.5 K/uL   Basophils Relative 1 %   Basophils Absolute 0.0 0.0 - 0.1 K/uL   Immature Granulocytes 0 %   Abs Immature Granulocytes 0.02 0.00 - 0.07 K/uL  Urinalysis, Routine w reflex microscopic Urine, Clean Catch  Result Value Ref Range   Color, Urine YELLOW YELLOW   APPearance CLEAR CLEAR   Specific Gravity, Urine 1.019 1.005 - 1.030   pH 6.0 5.0 - 8.0   Glucose, UA NEGATIVE NEGATIVE mg/dL   Hgb urine dipstick NEGATIVE NEGATIVE   Bilirubin Urine NEGATIVE NEGATIVE   Ketones, ur NEGATIVE NEGATIVE mg/dL   Protein, ur NEGATIVE NEGATIVE mg/dL   Nitrite NEGATIVE NEGATIVE   Leukocytes,Ua NEGATIVE NEGATIVE  Lactic acid, plasma  Result Value Ref Range   Lactic Acid, Venous 0.5 0.5 - 1.9 mmol/L  Lactic acid, plasma  Result Value Ref Range   Lactic Acid, Venous 0.7 0.5 - 1.9 mmol/L  Comprehensive metabolic panel  Result Value Ref Range   Sodium 147 (H) 135 - 145 mmol/L   Potassium SPECIMEN CONTAMINATED, UNABLE TO PERFORM TEST(S). 3.5 - 5.1 mmol/L   Chloride 104 98 - 111 mmol/L   CO2 36 (H) 22 - 32 mmol/L   Glucose, Bld SPECIMEN CONTAMINATED, UNABLE TO PERFORM TEST(S). 70 - 99 mg/dL   BUN 18 6 - 20 mg/dL   Creatinine, Ser <4.73 (L) 0.61 - 1.24 mg/dL   Calcium 8.8 (L) 8.9 - 10.3 mg/dL   Total Protein 7.7 6.5 - 8.1 g/dL   Albumin 3.7 3.5 - 5.0 g/dL   AST 30 15 - 41 U/L   ALT 36 0 - 44 U/L   Alkaline Phosphatase 122 38 - 126 U/L   Total Bilirubin 0.4 0.3 - 1.2 mg/dL  GFR, Estimated NOT CALCULATED >60 mL/min   Anion gap 7 5 - 15  Blood gas, venous  Result Value Ref Range   pH, Ven 7.182 (LL) 7.250 - 7.430   pCO2, Ven 97.5 (HH) 44.0 - 60.0 mmHg   pO2, Ven 58.8 (H) 32.0 - 45.0 mmHg   Bicarbonate 35.1 (H) 20.0 - 28.0 mmol/L   Acid-Base Excess 4.3 (H) 0.0 - 2.0 mmol/L   O2  Saturation 85.7 %   Patient temperature 98.6   CBG monitoring, ED  Result Value Ref Range   Glucose-Capillary 132 (H) 70 - 99 mg/dL   CT Angio Chest PE W and/or Wo Contrast  Addendum Date: 01/12/2021   ADDENDUM REPORT: 01/12/2021 04:52 ADDENDUM: Study discussed by telephone with Dr. Roslynn Amble in the ED on 01/12/2021 at 0438 hours. And he called my attention to a more recent comparison chest CT 08/21/2020. The small left-side pneumothorax, and much of the left lung surface reticulonodular changes, are stable since November. The small right pneumothorax is new since that time, where as the small component of right pleural effusion has regressed since November. Right lung surface reticulonodular changes have mildly increased since November, while right lower lobe peribronchial ground-glass opacity has resolved. CONCLUSION: 1. Stable left lung since November, with persistent small left pneumothorax. 2. New small right pneumothorax, but regressed right pleural effusion and right lower lobe bronchopneumonia since November. Electronically Signed   By: Genevie Ann M.D.   On: 01/12/2021 04:52   Result Date: 01/12/2021 CLINICAL DATA:  25 year old male with muscular dystrophy. Respiratory failure, shortness of breath. EXAM: CT ANGIOGRAPHY CHEST WITH CONTRAST TECHNIQUE: Multidetector CT imaging of the chest was performed using the standard protocol during bolus administration of intravenous contrast. Multiplanar CT image reconstructions and MIPs were obtained to evaluate the vascular anatomy. CONTRAST:  73mL OMNIPAQUE IOHEXOL 350 MG/ML SOLN COMPARISON:  Portable chest 2359 hours.  Chest CTA 12/01/2017. FINDINGS: Cardiovascular: Excellent contrast bolus timing in the pulmonary arterial tree. No focal filling defect identified in the pulmonary arteries to suggest acute pulmonary embolism. Right side PICC line. No cardiomegaly or pericardial effusion. Negative visible aorta. Mediastinum/Nodes: Residual thymus in the anterior  superior mediastinum. No mediastinal lymphadenopathy. Hilar lymph node tissue not significantly changed. Lungs/Pleura: Small right hydropneumothorax, with pleural gas most pronounced at the anterior costophrenic angle (series 7, image 100). Small volume dependent low-density pleural fluid. Reticulonodular changes along the anterior surface of all right lung lobes, perhaps atelectasis but infection not excluded. Similar peripheral reticulonodular changes in the left lung also with trace pneumothorax (left lung apex series 7, image 15). Small peripheral subpleural 11 mm opacity in the lateral basal segment of the left lower lobe, new since 2019. No left pleural effusion. No consolidation. Major airways are patent. Upper Abdomen: No pneumoperitoneum in the visible upper abdomen. Visible liver, spleen, pancreas, adrenal glands, kidneys and bowel appear within normal limits. Musculoskeletal: Osteopenia with levoconvex scoliosis. No rib fracture identified. No acute osseous abnormality identified. Review of the MIP images confirms the above findings. IMPRESSION: 1. Negative for acute pulmonary embolus, but positive for Small Bilateral Pneumothoraces - greater on the right with small hydropneumothorax on that side. These might not be suitable for chest tube placement. 2. Superimposed nonspecific reticulonodular thickening along the surfaces of both lungs - possibly atelectasis but lung infection not excluded. Small nonspecific subpleural opacity also in the left lower lobe. No left pleural fluid. 3. Osteopenia.  No acute osseous abnormality identified. 4. Negative visible upper abdominal viscera. Electronically Signed: By: Lemmie Evens  [place] Crane M.D. On: 01/12/2021 04:37   DG Chest Portable 1 View  Result Date: 01/10/2021 CLINICAL DATA:  Weakness and shortness of breath EXAM: PORTABLE CHEST 1 VIEW COMPARISON:  01/13/2021 FINDINGS: Cardiac shadows within normal limits. Right-sided PICC line is noted in satisfactory position and  stable. Lungs are well aerated bilaterally without focal infiltrate or sizable effusion. Small left apical pneumothorax is again noted and stable. No bony abnormality is seen. IMPRESSION: Stable left apical pneumothorax.  No new focal abnormality is noted. Electronically Signed   By: Inez Catalina M.D.   On: 01/10/2021 03:53   DG CHEST PORT 1 VIEW  Result Date: 01/13/2021 CLINICAL DATA:  Pneumothorax EXAM: PORTABLE CHEST 1 VIEW COMPARISON:  Prior chest x-ray dated yesterday FINDINGS: Stable trace left apical pneumothorax. Cardiac and mediastinal contours remain unchanged. Right upper extremity PICC in good position with the tip at the superior cavoatrial junction. Overall, stable appearance of the chest. No significant interval change. IMPRESSION: Stable trace left apical pneumothorax. Electronically Signed   By: Jacqulynn Cadet M.D.   On: 01/13/2021 08:35   DG CHEST PORT 1 VIEW  Result Date: 01/12/2021 CLINICAL DATA:  Chronic hypoxic and hypercapnic respiratory failure. EXAM: PORTABLE CHEST 1 VIEW COMPARISON:  Chest x-ray 01/11/2021 and chest CT 01/12/2021 FINDINGS: The cardiac silhouette, mediastinal and hilar contours are within normal limits and stable. The PICC line is stable. Chronic streaky interstitial scarring changes mainly in the upper lobes bilaterally. No acute superimposed pulmonary process is identified. Small left apical pneumothorax is stable. The right-sided pneumothorax seen on the chest CT is difficult to see on this chest x-ray. Gaseous distention of the bowel suggesting a diffuse ileus. IMPRESSION: 1. Stable small left apical pneumothorax. The right-sided pneumothorax seen on the chest CT is not well demonstrated on this chest x-ray. 2. Chronic streaky interstitial scarring changes mainly in the upper lobes. Electronically Signed   By: Marijo Sanes M.D.   On: 01/12/2021 13:46   DG Chest Port 1 View  Result Date: 01/12/2021 CLINICAL DATA:  Left back pain.  Shortness of breath EXAM:  PORTABLE CHEST 1 VIEW COMPARISON:  10/26/2020 FINDINGS: Right PICC line in place with the tip in the right atrium. Heart is normal size. Linear scarring in the right upper lobe. No acute confluent opacities or effusions. No acute bony abnormality. IMPRESSION: No acute cardiopulmonary disease. Electronically Signed   By: Rolm Baptise M.D.   On: 01/12/2021 00:06   US Abdomen Limited RUQ (LIVER/GB)  Result Date: 01/12/2021 CLINICAL DATA:  25 year old male with abnormal increased LFTs. Muscular dystrophy. EXAM: ULTRASOUND ABDOMEN LIMITED RIGHT UPPER QUADRANT COMPARISON:  CT Abdomen and Pelvis 06/15/2020. Ultrasound 08/21/2020 FINDINGS: Gallbladder: No gallstones or wall thickening visualized. No sonographic Murphy sign noted by sonographer. Common bile duct: Diameter: 3 mm, normal. Liver: Diminutive left hepatic lobe as demonstrated by CT. Visible liver echogenicity within normal limits (image 24). No discrete liver lesion. No intrahepatic biliary ductal dilatation identified. Portal vein is patent on color Doppler imaging with normal direction of blood flow towards the liver. Other: Negative visible right kidney. IMPRESSION: Negative right upper quadrant ultrasound. Electronically Signed   By: Genevie Ann M.D.   On: 01/12/2021 09:55    EKG See muse  Radiology DG Chest Portable 1 View  Result Date: 01/31/2021 CLINICAL DATA:  Weakness and shortness of breath EXAM: PORTABLE CHEST 1 VIEW COMPARISON:  01/13/2021 FINDINGS: Cardiac shadows within normal limits. Right-sided PICC line is noted in satisfactory position and stable. Lungs are well aerated bilaterally  without focal infiltrate or sizable effusion. Small left apical pneumothorax is again noted and stable. No bony abnormality is seen. IMPRESSION: Stable left apical pneumothorax.  No new focal abnormality is noted. Electronically Signed   By: Inez Catalina M.D.   On: 01/16/2021 03:53    Procedures Procedures   Medications Ordered in ED Medications  0.9 %   sodium chloride infusion ( Intravenous New Bag/Given 01/26/2021 0505)    ED Course  I have reviewed the triage vital signs and the nursing notes.  Pertinent labs & imaging results that were available during my care of the patient were reviewed by me and considered in my medical decision making (see chart for details).    Patient placed on the BIPAP in the ED for hypercapnia will admit to medicine.    Peter Hayes was evaluated in Emergency Department on 01/12/2021 for the symptoms described in the history of present illness. He was evaluated in the context of the global COVID-19 pandemic, which necessitated consideration that the patient might be at risk for infection with the SARS-CoV-2 virus that causes COVID-19. Institutional protocols and algorithms that pertain to the evaluation of patients at risk for COVID-19 are in a state of rapid change based on information released by regulatory bodies including the CDC and federal and state organizations. These policies and algorithms were followed during the patient's care in the ED.  Final Clinical Impression(s) / ED Diagnoses Final diagnoses:  Pneumothorax, unspecified type  Hypercapnia    Rx / DC Orders ED Discharge Orders    None       Raymon Schlarb, MD 01/06/2021 6720

## 2021-01-27 NOTE — ED Notes (Signed)
Patient asking for water. Patient on BiPap and Dr. Randal Buba said to hold off on water.

## 2021-01-27 NOTE — Progress Notes (Signed)
Carryover admission to the day admitter.  This is a 25 year old male with history of muscular dystrophy, chronic left spontaneous pneumothorax, who is being admitted to [facility] for further evaluation and management of presenting acute hypercapnic respiratory failure after presenting to [facility] long ED complaining of shortness of breath.  He is awake alert and conversant, but presenting blood gas was associated with CO2 of 97.  He has been placed on BiPAP, and repeat blood gas has been ordered for 1 hour from now at 715 AM.  Chest x-ray shows stable chronic left pneumothorax.  Of note, he was hospitalized earlier in April for this pneumothorax, and discharged from the hospital service on April 6.  Today, screening COVID-19 PCR was found to be negative.  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  I have placed the admission order for inpatient admission to the stepdown unit.  I have also placed basic holding orders by completing the adult admission order set.     Babs Bertin, DO Hospitalist

## 2021-01-27 NOTE — Progress Notes (Signed)
Patient's bedside monitor upon admission to ICU/SD was alarming ST elevation. EKG obtained per protocol and showed ST. This RN will continue to carefully monitor pt.

## 2021-01-27 NOTE — H&P (Signed)
History and Physical    Peter Hayes IBB:[id number] DOB: [birth year] DOA: 01/26/2021  PCP: Penni Bombard, PA  Patient coming from: Home  Chief Complaint: Shortness of breath  HPI: Peter Hayes is a 25 y.o. male with medical history significant of profound disability secondary to muscular dystrophy, chronic respiratory failure dependent on BiPAP nightly, chronic stable pneumothorax, chronically on TPN, Jehovah's Hayes comes in with shortness of breath.  Patient has been taking care of by his father and other family members at home.  All history is obtained from the father at the bedside.  He is very compliant with his BiPAP.  They make sure he gets his TPN.  They make sure he gets his BiPAP every night.  He reports no fevers.  He reports no nausea or vomiting.  Patient chronically debilitated and bedbound.  He is verbal somewhat.  Patient found to have hypercapnic respiratory failure on admission.  He was noted to be very alert.  However through the morning he is continued to deteriorate.  He had an episode where he got acutely hypoxic his BiPAP was taken off and he was bagged mast his O2 sats recovered from 30% to 100%.  Deep suction was attempted by respiratory therapy and there was no significant results from that.  Patient became unresponsive.  He maintained a pulse throughout.  BiPAP was placed back on and patient referred for admission for hypercapnic respiratory failure.  Review of Systems: Unobtainable from patient due to chronic illnesses  Past Medical History:  Diagnosis Date  . Acute respiratory failure with hypoxia and hypercapnia ([place]) 05/16/2018  . HCAP (healthcare-associated pneumonia) 12/01/2017  . High anion gap metabolic acidosis 4/50/3888  . Hypotension   . Ileus ([place]) 09/23/2017  . Leukocytosis 05/05/2016  . MD (muscular dystrophy) ([place])   . Refusal of blood product    patient is Peter Hayes  . Respiratory alkalosis 05/05/2016  . Sepsis ([place]) 05/05/2016  . Severe sepsis  ([place]) 09/16/2019  . Sinus tachycardia 05/05/2016    Past Surgical History:  Procedure Laterality Date  . COLOSTOMY    . EYE SURGERY    . FLEXIBLE SIGMOIDOSCOPY N/A 01/10/2018   Procedure: FLEXIBLE SIGMOIDOSCOPY;  Surgeon: Laurence Spates, MD;  Location: [facility];  Service: Endoscopy;  Laterality: N/A;  . SMALL INTESTINE SURGERY       reports that he has never smoked. He has never used smokeless tobacco. He reports that he does not drink alcohol and does not use drugs.  Allergies  Allergen Reactions  . Fentanyl Other (See Comments)    Dizziness  . Other Other (See Comments)    As per father allergic to one more medicine, but don't know the name.    History reviewed. No pertinent family history.  Prior to Admission medications   Medication Sig Start Date End Date Taking? Authorizing Provider  Catheters (ARGYLE SUCTION CATHETER 14FR) MISC 1 packet by Does not apply route daily. 08/27/20  Yes Charlynne Cousins, MD  ipratropium-albuterol (DUONEB) 0.5-2.5 (3) MG/3ML SOLN Take 3 mLs by nebulization every 6 (six) hours as needed. Patient taking differently: Take 3 mLs by nebulization every 6 (six) hours as needed (breathing / congestion). 09/21/19  Yes Harold Hedge, MD  metoprolol tartrate (LOPRESSOR) 25 MG tablet Take 1 tablet (25 mg total) by mouth 2 (two) times daily. Patient taking differently: Take 25 mg by mouth See admin instructions. Per Father 1 tablet three times daily 11/01/20  Yes Swayze, Ava, DO  Multiple Vitamin (MULTIVITAMIN WITH MINERALS)  TABS tablet Take 1 tablet by mouth daily.   Yes [provider]  ondansetron (ZOFRAN) 4 MG tablet Take 1 tablet (4 mg total) by mouth every 6 (six) hours as needed for nausea. Patient taking differently: Take 4 mg by mouth every 8 (eight) hours as needed for nausea. 06/21/20  Yes Nita Sells, MD  polyethylene glycol (MIRALAX / Floria Raveling) packet Follow package instruction for daily use.  You need to have one soft bowel  movement per day.  If he is not doing this call your primary care doctor.  You also need to be sure he takes in 1.5 liters of fluid per day.   He needs protein supplement daily, and needs to take in about 1300 calories per day. Patient taking differently: Take 17 g by mouth daily as needed for moderate constipation. 01/15/18  Yes Earnstine Regal, PA-C  scopolamine (TRANSDERM-SCOP) 1 MG/3DAYS Place 2 patches (3 mg total) onto the skin every 3 (three) days. 11/02/20  Yes Swayze, Ava, DO  sterile water SOLN with amino acids 10 % SOLN 1.3 g/kg, dextrose 70 % SOLN 20 % Inject 1,000 mLs into the vein continuous. TPN 1000 ml w/ lipids  Directions: Infuse TPN 103ml IV through PICC line via CADD SOLIS infusion pump daily over 18 hours with 1 hr ramp up and 1 hr ramp down. Add MVI 10 ml as directed to each bag prior to infusion. MIX WELL VTBI: 1000 ML Bag Volume  1050 ml     Dosing Weight for the patient 72 lbs   Yes [provider]    Physical Exam: Vitals:   01/13/2021 0530 02/02/2021 0614 02/03/2021 0645 01/14/2021 0729  BP: 128/80  111/73   Pulse: (!) 108 (!) 112 (!) 117 (!) 123  Resp: 16  17 (!) 24  Temp:      TempSrc:      SpO2: 96% 100% 100%   Weight:      Height:          Constitutional: NAD, calm, comfortable initially alert awake chronic debility Vitals:   02/04/2021 0530 01/19/2021 0614 01/07/2021 0645 01/26/2021 0729  BP: 128/80  111/73   Pulse: (!) 108 (!) 112 (!) 117 (!) 123  Resp: 16  17 (!) 24  Temp:      TempSrc:      SpO2: 96% 100% 100%   Weight:      Height:       Eyes: PERRL, lids and conjunctivae normal ENMT: Mucous membranes are moist. Posterior pharynx clear of any exudate or lesions.Normal dentition.  Neck: normal, supple, no masses, no thyromegaly Respiratory: clear to auscultation bilaterally, no wheezing, no crackles.  Profoundly compromised respiratory effort chronically secondary to muscular dystrophy.  Takes shallow breathing at baseline. Cardiovascular: Regular  rate and rhythm, no murmurs / rubs / gallops. No extremity edema. 2+ pedal pulses. No carotid bruits.  Abdomen: no tenderness, no masses palpated. No hepatosplenomegaly. Bowel sounds positive.  Musculoskeletal: no clubbing / cyanosis.  Contracted.  Markedly decreased muscle mass.   Neurologic: Profoundly neurologically debilitated.  Cannot follow commands. Psychiatric: Not agitated   Labs on Admission: I have personally reviewed following labs and imaging studies  CBC: Recent Labs  Lab 01/22/2021 0407  WBC 6.5  NEUTROABS 4.6  HGB 11.4*  HCT 40.5  MCV 90.2  PLT 037*   Basic Metabolic Panel: Recent Labs  Lab 01/27/21 0458  NA 147*  K SPECIMEN CONTAMINATED, UNABLE TO PERFORM TEST(S).  CL 104  CO2 36*  GLUCOSE  SPECIMEN CONTAMINATED, UNABLE TO PERFORM TEST(S).  BUN 18  CREATININE <0.30*  CALCIUM 8.8*   GFR: CrCl cannot be calculated (This lab value cannot be used to calculate CrCl because it is not a number: <0.30). Liver Function Tests: Recent Labs  Lab 01/24/2021 0458  AST 30  ALT 36  ALKPHOS 122  BILITOT 0.4  PROT 7.7  ALBUMIN 3.7   No results for input(s): LIPASE, AMYLASE in the last 168 hours. No results for input(s): AMMONIA in the last 168 hours. Coagulation Profile: No results for input(s): INR, PROTIME in the last 168 hours. Cardiac Enzymes: No results for input(s): CKTOTAL, CKMB, CKMBINDEX, TROPONINI in the last 168 hours. BNP (last 3 results) No results for input(s): PROBNP in the last 8760 hours. HbA1C: No results for input(s): HGBA1C in the last 72 hours. CBG: Recent Labs  Lab 02/01/2021 0606  GLUCAP 132*   Lipid Profile: No results for input(s): CHOL, HDL, LDLCALC, TRIG, CHOLHDL, LDLDIRECT in the last 72 hours. Thyroid Function Tests: No results for input(s): TSH, T4TOTAL, FREET4, T3FREE, THYROIDAB in the last 72 hours. Anemia Panel: No results for input(s): VITAMINB12, FOLATE, FERRITIN, TIBC, IRON, RETICCTPCT in the last 72 hours. Urine  analysis:    Component Value Date/Time   COLORURINE YELLOW 01/25/2021 [place] 01/19/2021 0459   LABSPEC 1.019 01/23/2021 0459   PHURINE 6.0 01/26/2021 0459   GLUCOSEU NEGATIVE 01/11/2021 0459   HGBUR NEGATIVE 01/26/2021 0459   BILIRUBINUR NEGATIVE 02/03/2021 0459   KETONESUR NEGATIVE 01/09/2021 0459   PROTEINUR NEGATIVE 01/09/2021 0459   NITRITE NEGATIVE 01/24/2021 0459   LEUKOCYTESUR NEGATIVE 01/29/2021 0459   Sepsis Labs: !!!!!!!!!!!!!!!!!!!!!!!!!!!!!!!!!!!!!!!!!!!! @LABRCNTIP (procalcitonin:4,lacticidven:4) ) Recent Results (from the past 240 hour(s))  Resp Panel by RT-PCR (Flu A&B, Covid) Nasopharyngeal Swab     Status: None   Collection Time: 01/27/2021  3:39 AM   Specimen: Nasopharyngeal Swab; Nasopharyngeal(NP) swabs in vial transport medium  Result Value Ref Range Status   SARS Coronavirus 2 by RT PCR NEGATIVE NEGATIVE Final    Comment: (NOTE) SARS-CoV-2 target nucleic acids are NOT DETECTED.  The SARS-CoV-2 RNA is generally detectable in upper respiratory specimens during the acute phase of infection. The lowest concentration of SARS-CoV-2 viral copies this assay can detect is 138 copies/mL. A negative result does not preclude SARS-Cov-2 infection and should not be used as the sole basis for treatment or other patient management decisions. A negative result may occur with  improper specimen collection/handling, submission of specimen other than nasopharyngeal swab, presence of viral mutation(s) within the areas targeted by this assay, and inadequate number of viral copies(<138 copies/mL). A negative result must be combined with clinical observations, patient history, and epidemiological information. The expected result is Negative.  Fact Sheet for Patients:  EntrepreneurPulse.com.au  Fact Sheet for Healthcare Providers:  IncredibleEmployment.be  This test is no t yet approved or cleared by the [place] FDA  and  has been authorized for detection and/or diagnosis of SARS-CoV-2 by FDA under an Emergency Use Authorization (EUA). This EUA will remain  in effect (meaning this test can be used) for the duration of the COVID-19 declaration under Section 564(b)(1) of the Act, 21 U.S.C.section 360bbb-3(b)(1), unless the authorization is terminated  or revoked sooner.       Influenza A by PCR NEGATIVE NEGATIVE Final   Influenza B by PCR NEGATIVE NEGATIVE Final    Comment: (NOTE) The Xpert Xpress SARS-CoV-2/FLU/RSV plus assay is intended as an aid in the diagnosis of influenza from Nasopharyngeal swab  specimens and should not be used as a sole basis for treatment. Nasal washings and aspirates are unacceptable for Xpert Xpress SARS-CoV-2/FLU/RSV testing.  Fact Sheet for Patients: EntrepreneurPulse.com.au  Fact Sheet for Healthcare Providers: IncredibleEmployment.be  This test is not yet approved or cleared by the [place] FDA and has been authorized for detection and/or diagnosis of SARS-CoV-2 by FDA under an Emergency Use Authorization (EUA). This EUA will remain in effect (meaning this test can be used) for the duration of the COVID-19 declaration under Section 564(b)(1) of the Act, 21 U.S.C. section 360bbb-3(b)(1), unless the authorization is terminated or revoked.  Performed at [facility], [place] [address] [place], [place] [address]      Radiological Exams on Admission: CT Angio Chest PE W and/or Wo Contrast  Result Date: 01/26/2021 CLINICAL DATA:  Worsening shortness of breath and fever EXAM: CT ANGIOGRAPHY CHEST WITH CONTRAST TECHNIQUE: Multidetector CT imaging of the chest was performed using the standard protocol during bolus administration of intravenous contrast. Multiplanar CT image reconstructions and MIPs were obtained to evaluate the vascular anatomy. CONTRAST:  19mL OMNIPAQUE IOHEXOL 350 MG/ML SOLN COMPARISON:   01/12/2021 FINDINGS: Cardiovascular: Normal heart size. No pericardial effusion. Right PICC in place. No pulmonary artery filling defects. Negative aorta Mediastinum/Nodes: No adenopathy or inflammation seen. Visible thymus without nodularity or hypertrophy. Lungs/Pleura: Small biapical pneumothorax without interval change. There is pleural and subpleural reticulation at the apices which has a scar-like appearance. Trace dependent pleural fluid on the right, unchanged. Upper Abdomen: Negative Musculoskeletal: Severe muscular atrophy in the setting of muscular dystrophy. Scoliosis and osteopenia. No acute finding Review of the MIP images confirms the above findings. IMPRESSION: 1. Stable compared to 2 weeks prior. 2. Small bilateral pneumothorax with biapical pleural based scarring. 3. Negative for pulmonary embolism. Electronically Signed   By: Monte Fantasia M.D.   On: 01/12/2021 07:02   DG Chest Portable 1 View  Result Date: 01/09/2021 CLINICAL DATA:  Weakness and shortness of breath EXAM: PORTABLE CHEST 1 VIEW COMPARISON:  01/13/2021 FINDINGS: Cardiac shadows within normal limits. Right-sided PICC line is noted in satisfactory position and stable. Lungs are well aerated bilaterally without focal infiltrate or sizable effusion. Small left apical pneumothorax is again noted and stable. No bony abnormality is seen. IMPRESSION: Stable left apical pneumothorax.  No new focal abnormality is noted. Electronically Signed   By: Inez Catalina M.D.   On: 01/15/2021 03:53    Cardiac monitoring sinus tachycardia with a rate less than 115 Old chart reviewed Case discussed with pulmonology critical care Dr. Chase Caller  Assessment/Plan  25 year old male with profound muscular dystrophy comes in with acute on chronic hypercapnic respiratory failure  Principal Problem:    Acute respiratory failure with hypercapnia (HCC)-patient has continued to deteriorate through the morning with worsening hypercapnia and hypoxia.   Patient been on BiPAP.  I have updated father at the bedside.  Suspect the patient will continue to decline and failed BiPAP.  He does not want patient to be intubated.  He does want CPR.  Highly suspect patient will eventually code and expire during this hospitalization.  There is no signs of infection.  His pneumothorax is stable.  Suspect this is natural progression of his severe multiple sclerosis.  I have called PCCM for consultation to see if they can add anything else that could be done in the setting of this patient who is a DNI at this time.  Active Problems:    Muscular dystrophy (HCC)-contributing to his respiratory  status as above    Moderate protein-calorie malnutrition (HCC)-chronically on TPN  His father states no one is spoken to him about his CODE STATUS before.  He does not want intubation.  He does want CPR and ACLS drugs if needed.  Further discussions need to be made with family ultimately but since these discussions are just beginning at this time we will change CODE STATUS to DNI.  Nursing staff and respiratory staff also updated a change in CODE STATUS at the bedside in the ED.   DVT prophylaxis: SCDs Code Status: DNI Family Communication: Father Disposition Plan: Days Consults called: PCCM Admission status: Admission   Kerilyn Cortner A MD [facility]  If 7PM-7AM, please contact night-coverage [email or web address] Password [facility]  02/03/2021, 7:56 AM

## 2021-01-27 NOTE — ED Notes (Signed)
Ph 7.182 PCO2 97.5  Reported to A. Palumbo

## 2021-01-27 NOTE — Progress Notes (Signed)
Chaplain engaged in an initial visit with Peter Hayes and his family.  Chaplain offered support to them.  They did not have any needs at this time.  Chaplain will follow-up.    01/26/2021 1200  Clinical Encounter Type  Visited With Patient and family together  Visit Type Initial

## 2021-01-27 NOTE — ED Notes (Signed)
Entered room, patient's oxygen was noted at 35% on Bi-PAP. Hospitalitist and primary RN notified, respiratory called. Began to bag patient. Oxygen increased to 95%.

## 2021-01-27 NOTE — Progress Notes (Signed)
Unit charge RN and AD agree with allowing family to cycle out visitors at this time, so long as staff is able to perform necessary interventions. Family is agreeable to limit to 4 visitors in the room at one time. RN will continue to care for pt and provide support to family members.

## 2021-01-27 NOTE — Progress Notes (Signed)
CBG at 1530 was 69. Hypoglycemia protocol was implemented by RN, and 1/2 amp D50 was given by RN per standing orders. Rechecked CBG was 183. This RN will continue to carefully monitor pt.

## 2021-01-27 NOTE — ED Notes (Signed)
TPN paused for blood draw off of PICC line

## 2021-01-27 NOTE — ED Notes (Signed)
PH 7.105. CO2 over 120.0, MD Shanon Brow notified.

## 2021-01-27 NOTE — ED Triage Notes (Signed)
Patient BIB [facility] from home. The patients father said his weakness has gotten worse the last few days. Patient has muscular dystrophy. Alert and oriented x4.   EMS vitals 110/70 96% 3L Bipap (uses at home) 90 HR CBG 178 RR 24

## 2021-01-28 ENCOUNTER — Inpatient Hospital Stay (HOSPITAL_COMMUNITY): Payer: Managed Care, Other (non HMO)

## 2021-01-28 DIAGNOSIS — E440 Moderate protein-calorie malnutrition: Secondary | ICD-10-CM | POA: Diagnosis not present

## 2021-01-28 DIAGNOSIS — J9612 Chronic respiratory failure with hypercapnia: Secondary | ICD-10-CM

## 2021-01-28 DIAGNOSIS — E872 Acidosis: Secondary | ICD-10-CM | POA: Diagnosis not present

## 2021-01-28 DIAGNOSIS — A419 Sepsis, unspecified organism: Secondary | ICD-10-CM | POA: Diagnosis not present

## 2021-01-28 DIAGNOSIS — J9602 Acute respiratory failure with hypercapnia: Secondary | ICD-10-CM | POA: Diagnosis not present

## 2021-01-28 DIAGNOSIS — Z7189 Other specified counseling: Secondary | ICD-10-CM

## 2021-01-28 DIAGNOSIS — IMO0001 Reserved for inherently not codable concepts without codable children: Secondary | ICD-10-CM | POA: Diagnosis present

## 2021-01-28 DIAGNOSIS — Z7401 Bed confinement status: Secondary | ICD-10-CM

## 2021-01-28 DIAGNOSIS — R69 Illness, unspecified: Secondary | ICD-10-CM

## 2021-01-28 DIAGNOSIS — R6521 Severe sepsis with septic shock: Secondary | ICD-10-CM | POA: Diagnosis not present

## 2021-01-28 DIAGNOSIS — R579 Shock, unspecified: Secondary | ICD-10-CM | POA: Diagnosis not present

## 2021-01-28 DIAGNOSIS — G9341 Metabolic encephalopathy: Secondary | ICD-10-CM | POA: Diagnosis not present

## 2021-01-28 DIAGNOSIS — Z66 Do not resuscitate: Secondary | ICD-10-CM

## 2021-01-28 DIAGNOSIS — Z789 Other specified health status: Secondary | ICD-10-CM | POA: Diagnosis present

## 2021-01-28 LAB — CBC
HCT: 40.9 % (ref 39.0–52.0)
HCT: 41.2 % (ref 39.0–52.0)
Hemoglobin: 10.7 g/dL — ABNORMAL LOW (ref 13.0–17.0)
Hemoglobin: 10.1 g/dL — ABNORMAL LOW (ref 13.0–17.0)
MCH: 25.4 pg — ABNORMAL LOW (ref 26.0–34.0)
MCH: 26.2 pg (ref 26.0–34.0)
MCHC: 26.2 g/dL — ABNORMAL LOW (ref 30.0–36.0)
MCHC: 24.5 g/dL — ABNORMAL LOW (ref 30.0–36.0)
MCV: 97.1 fL (ref 80.0–100.0)
MCV: 107 fL — ABNORMAL HIGH (ref 80.0–100.0)
Platelets: UNDETERMINED 10*3/uL (ref 150–400)
Platelets: 164 10*3/uL (ref 150–400)
RBC: 4.21 MIL/uL — ABNORMAL LOW (ref 4.22–5.81)
RBC: 3.85 MIL/uL — ABNORMAL LOW (ref 4.22–5.81)
RDW: 15.1 % (ref 11.5–15.5)
RDW: 15.8 % — ABNORMAL HIGH (ref 11.5–15.5)
WBC: 7.6 10*3/uL (ref 4.0–10.5)
WBC: 25.6 10*3/uL — ABNORMAL HIGH (ref 4.0–10.5)
nRBC: 0.5 % — ABNORMAL HIGH (ref 0.0–0.2)
nRBC: 0.7 % — ABNORMAL HIGH (ref 0.0–0.2)

## 2021-01-28 LAB — COMPREHENSIVE METABOLIC PANEL
ALT: 38 U/L (ref 0–44)
AST: 33 U/L (ref 15–41)
Albumin: 3.1 g/dL — ABNORMAL LOW (ref 3.5–5.0)
Alkaline Phosphatase: 118 U/L (ref 38–126)
Anion gap: 3 — ABNORMAL LOW (ref 5–15)
BUN: 38 mg/dL — ABNORMAL HIGH (ref 6–20)
CO2: 35 mmol/L — ABNORMAL HIGH (ref 22–32)
Calcium: 7.8 mg/dL — ABNORMAL LOW (ref 8.9–10.3)
Chloride: 111 mmol/L (ref 98–111)
Creatinine, Ser: <0.3 mg/dL — ABNORMAL LOW (ref 0.61–1.24)
Glucose, Bld: 112 mg/dL — ABNORMAL HIGH (ref 70–99)
Potassium: 4.7 mmol/L (ref 3.5–5.1)
Sodium: 149 mmol/L — ABNORMAL HIGH (ref 135–145)
Total Bilirubin: 0.6 mg/dL (ref 0.3–1.2)
Total Protein: 6.3 g/dL — ABNORMAL LOW (ref 6.5–8.1)

## 2021-01-28 LAB — DIFFERENTIAL
Abs Immature Granulocytes: 0.2 10*3/uL — ABNORMAL HIGH (ref 0.00–0.07)
Band Neutrophils: 15 %
Basophils Absolute: 0 10*3/uL (ref 0.0–0.1)
Basophils Relative: 0 %
Eosinophils Absolute: 0 10*3/uL (ref 0.0–0.5)
Eosinophils Relative: 0 %
Lymphocytes Relative: 10 %
Lymphs Abs: 0.8 10*3/uL (ref 0.7–4.0)
Metamyelocytes Relative: 1 %
Monocytes Absolute: 0.2 10*3/uL (ref 0.1–1.0)
Monocytes Relative: 2 %
Myelocytes: 1 %
Neutro Abs: 6.5 10*3/uL (ref 1.7–7.7)
Neutrophils Relative %: 71 %

## 2021-01-28 LAB — MAGNESIUM: Magnesium: 2 mg/dL (ref 1.7–2.4)

## 2021-01-28 LAB — BLOOD GAS, ARTERIAL
Acid-Base Excess: 0.2 mmol/L (ref 0.0–2.0)
Bicarbonate: 36.4 mmol/L — ABNORMAL HIGH (ref 20.0–28.0)
FIO2: 50
Mode: POSITIVE
O2 Saturation: 97.6 %
Patient temperature: 98.6
RATE: 18 resp/min
pCO2 arterial: 163 mmHg (ref 32.0–48.0)
pH, Arterial: 6.979 — CL (ref 7.350–7.450)
pO2, Arterial: 115 mmHg — ABNORMAL HIGH (ref 83.0–108.0)

## 2021-01-28 LAB — CK TOTAL AND CKMB (NOT AT ARMC)
CK, MB: 5.3 ng/mL — ABNORMAL HIGH (ref 0.5–5.0)
Relative Index: 2.7 — ABNORMAL HIGH (ref 0.0–2.5)
Total CK: 193 U/L (ref 49–397)

## 2021-01-28 LAB — PHOSPHORUS: Phosphorus: 5.1 mg/dL — ABNORMAL HIGH (ref 2.5–4.6)

## 2021-01-28 LAB — LACTIC ACID, PLASMA
Lactic Acid, Venous: 2 mmol/L (ref 0.5–1.9)
Lactic Acid, Venous: 1.7 mmol/L (ref 0.5–1.9)

## 2021-01-28 LAB — GLUCOSE, CAPILLARY
Glucose-Capillary: 138 mg/dL — ABNORMAL HIGH (ref 70–99)
Glucose-Capillary: 159 mg/dL — ABNORMAL HIGH (ref 70–99)
Glucose-Capillary: 161 mg/dL — ABNORMAL HIGH (ref 70–99)

## 2021-01-28 LAB — PREALBUMIN: Prealbumin: 9.4 mg/dL — ABNORMAL LOW (ref 18–38)

## 2021-01-28 LAB — CORTISOL: Cortisol, Plasma: 51.6 ug/dL

## 2021-01-28 LAB — PROTIME-INR
INR: 1.3 — ABNORMAL HIGH (ref 0.8–1.2)
Prothrombin Time: 16.4 seconds — ABNORMAL HIGH (ref 11.4–15.2)

## 2021-01-28 LAB — TRIGLYCERIDES: Triglycerides: 49 mg/dL (ref <150)

## 2021-01-28 LAB — PROCALCITONIN: Procalcitonin: 5.86 ng/mL

## 2021-01-28 MED ORDER — SODIUM BICARBONATE 8.4 % IV SOLN
INTRAVENOUS | Status: AC
  Filled 2021-01-28: qty 50

## 2021-01-28 MED ORDER — PIPERACILLIN-TAZOBACTAM 3.375 G IVPB
3.3750 g | Freq: Three times a day (TID) | INTRAVENOUS | Status: DC
  Administered 2021-01-28: 3.375 g via INTRAVENOUS
  Filled 2021-01-28: qty 50

## 2021-01-28 MED ORDER — LACTATED RINGERS IV BOLUS
1000.0000 mL | Freq: Once | INTRAVENOUS | Status: AC
  Administered 2021-01-28: 1000 mL via INTRAVENOUS

## 2021-01-28 MED ORDER — SODIUM CHLORIDE 0.9 % IV BOLUS
1000.0000 mL | Freq: Once | INTRAVENOUS | Status: AC
  Administered 2021-01-28: 1000 mL via INTRAVENOUS

## 2021-01-28 MED ORDER — PHENYLEPHRINE HCL-NACL 10-0.9 MG/250ML-% IV SOLN: 0.0000 ug/min | INTRAVENOUS | Status: DC

## 2021-01-28 MED ORDER — MORPHINE SULFATE (PF) 2 MG/ML IV SOLN
1.0000 mg | INTRAVENOUS | Status: DC | PRN
  Filled 2021-01-28: qty 1

## 2021-01-28 MED ORDER — HYDROCORTISONE NA SUCCINATE PF 100 MG IJ SOLR
50.0000 mg | Freq: Four times a day (QID) | INTRAMUSCULAR | Status: DC
  Administered 2021-01-28: 50 mg via INTRAVENOUS
  Filled 2021-01-28 (×2): qty 2

## 2021-01-28 MED ORDER — VANCOMYCIN HCL 750 MG/150ML IV SOLN
750.0000 mg | Freq: Two times a day (BID) | INTRAVENOUS | Status: DC
  Administered 2021-01-28: 750 mg via INTRAVENOUS
  Filled 2021-01-28 (×3): qty 150

## 2021-01-28 MED ORDER — SODIUM BICARBONATE 8.4 % IV SOLN
INTRAVENOUS | Status: DC
  Filled 2021-01-28: qty 150

## 2021-01-28 MED ORDER — HEPARIN SODIUM (PORCINE) 5000 UNIT/ML IJ SOLN
5000.0000 [IU] | Freq: Three times a day (TID) | INTRAMUSCULAR | Status: DC
  Administered 2021-01-28: 5000 [IU] via SUBCUTANEOUS
  Filled 2021-01-28 (×2): qty 1

## 2021-01-28 MED ORDER — SODIUM BICARBONATE 8.4 % IV SOLN
100.0000 meq | Freq: Once | INTRAVENOUS | Status: AC
  Administered 2021-01-28: 100 meq via INTRAVENOUS
  Filled 2021-01-28: qty 50

## 2021-01-28 MED ORDER — TRAVASOL 10 % IV SOLN
INTRAVENOUS | Status: DC
  Filled 2021-01-28: qty 840

## 2021-01-28 MED ORDER — NOREPINEPHRINE 4 MG/250ML-% IV SOLN
0.0000 ug/min | INTRAVENOUS | Status: DC
  Administered 2021-01-28: 2 ug/min via INTRAVENOUS
  Administered 2021-01-28: 40 ug/min via INTRAVENOUS
  Filled 2021-01-28 (×3): qty 250

## 2021-01-28 MED ORDER — VASOPRESSIN 20 UNITS/100 ML INFUSION FOR SHOCK
0.0000 [IU]/min | INTRAVENOUS | Status: DC
  Administered 2021-01-28: 0.03 [IU]/min via INTRAVENOUS
  Filled 2021-01-28: qty 100

## 2021-01-28 MED ORDER — NOREPINEPHRINE 16 MG/250ML-% IV SOLN
0.0000 ug/min | INTRAVENOUS | Status: DC
  Administered 2021-01-28: 40 ug/min via INTRAVENOUS
  Filled 2021-01-28 (×2): qty 250

## 2021-01-28 MED ORDER — PHENYLEPHRINE CONCENTRATED 100MG/250ML (0.4 MG/ML) INFUSION SIMPLE
0.0000 ug/min | INTRAVENOUS | Status: DC
  Administered 2021-01-28: 30 ug/min via INTRAVENOUS
  Filled 2021-01-28: qty 250

## 2021-02-05 NOTE — Consult Note (Addendum)
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 Recently admitted 4/7 - 4/9 for left back pain, found to have pneumothorax. Of note, patient is a Jehovah's witness and does not wish to receive any blood products. On presentation to ED, patient's initial respiratory status was stable (sats 96% on 3L BiPAP, uses at home). Initial VBG demonstrated pH 7.182, PCO2 97.5 and patient was transitioned from home to ED BiPAP. Repeat VBG was slightly worsened and ABG was obtained, demonstrating severe respiratory acidosis with pH 6.95/pCO2 >120/pO2 328/Bicarb 42.1. After discussion with patient/family, ED provider reported family's desire for partial code status with DNI order placed. PCCM was consulted for further recommendations and management.  Recent admissions  - Early April 2022 for small right pneumothorax -January 2022: Coagulase-negative s staphylococcal bacteremia likely due to  A line -November 2021 for right lower lobe pneumonia and MSSA bacteremia due to PICC line [changed PICC line and 2D echo was negative for endocarditis]  Pneumothorax history:  - Small right pneumothorax developed in April 2022 [early April].  -  Chronic left pneumothorax 4 months as of April 2022 -No chest tube treatment  Pertinent Medical History:    has a past medical history of Acute respiratory failure with hypoxia and hypercapnia (HCC) (05/16/2018), HCAP  (healthcare-associated pneumonia) (12/01/2017), High anion gap metabolic acidosis (10/22/5206), Hypotension, Ileus ([place]) (09/23/2017), Leukocytosis (05/05/2016), MD (muscular dystrophy) ([place]), Refusal of blood product, Respiratory alkalosis (05/05/2016), Sepsis ([place]) (05/05/2016), Severe sepsis ([place]) (09/16/2019), and Sinus tachycardia (05/05/2016).    has a past surgical history that includes Eye surgery; Flexible sigmoidoscopy (N/A, 01/10/2018); Small intestine surgery; and Colostomy. Prior ileus in 2018+sigmoid volvulus + endoscopic decompression [facility]--eventual open sigmoid colectomy with pouch 0/11/2334 complicated by leak requiring colostomy resulting in VDRF and prolonged hospital stay and has been on chronic TPN since    Significant Hospital Events: Including procedures, antibiotic start and stop dates in addition to other pertinent events   . 4/22 Presented to [facility] ED with several day history of fatigue/increasing weakness. O2 sats initially 96% on 3L BiPAP (home machine); increasingly worse hypercarbic respiratory failure in ED prompting intubation inquiry; patient family wished for partial code/DNI order at this time. Transferred to ICU.  Interim History / Subjective:    4/23 -has had decline in the last 24 hours particularly overnight.  Severe respiratory acidosis.  Remains on BiPAP unresponsive.  Also now with circulatory shock.  Chest x-ray with stable/slightly worse pneumothorax.  No respiratory distress.  Afebrile.  Objective   Blood pressure (!) 76/37, pulse (!) 120, temperature 98.6 F (37 C), temperature source Axillary, resp. rate (!) 24, height $RemoveBe'5\' 5"'HloVTluKA$  (1.651 m), weight 37.4 kg, SpO2 98 %.    FiO2 (%):  [50 %-100 %] 50 %   Intake/Output Summary (Last 24 hours) at 02/06/2021 0733 Last data filed at Feb 06, 2021 0530 Gross per 24 hour  Intake 3792.21 ml  Output 300 ml  Net 3492.21 ml   Filed Weights   01/31/2021 0330 01/17/2021 0949 02-06-2021 0445  Weight: 38.1 kg 34.7  kg 37.4  kg   General Appearance:  Looks criticall ill. Cachectic, Wasted.  Head:  Normocephalic, without obvious abnormality, atraumatic Eyes:  PERRL - cannot examine, conjunctiva/corneas - cannot examine. Has BiPAP     Ears:  Normal external ear canals, both ears Nose:  G tube - x. Has BiPAP Throat:  ETT TUBE - no , OG tube - no. Has BiPAP Neck:  Supple,  No enlargement/tenderness/nodules Lungs: Clear to auscultation bilaterally, Ventilator   Synchrony - yes on bipap Heart:  S1 and S2 normal, no murmur, CVP - x.  Pressors - levophed + Abdomen:  Soft, no masses, no organomegaly Genitalia / Rectal:  Not done Extremities:  Extremities- wasted,  Skin:  ntact in exposed areas . Sacral area - not examined but reportedly with decub Neurologic:  Sedation - none -> RASS - -4 equivalent        Labs/imaging that I have personally reviewed:  (right click and "Reselect all SmartList Selections" daily)  See below   ICD-10 list   Present on Admission Present on Admission: . Acute respiratory failure with hypercapnia ([place]) . Muscular dystrophy ([place]) . Moderate protein-calorie malnutrition ([place]) . Acute metabolic encephalopathy . Patient is Jehovah's Witness . Pneumothorax . On total parenteral nutrition (TPN)   Active Problems Principal Problem:   Acute respiratory failure with hypercapnia (HCC) Active Problems:   Muscular dystrophy (HCC)   Moderate protein-calorie malnutrition (HCC)   Acute metabolic encephalopathy   DNI (do not intubate)   Acute on chronic respiratory acidosis   Goals of care, counseling/discussion   Terminal illness   Shock circulatory ([place])   Pneumothorax   Patient is Jehovah's Witness   On total parenteral nutrition (TPN)   Bed confinement status   Comprehensive Problem List Patient Active Problem List   Diagnosis Date Noted  . DNI (do not intubate) 02/18/2021    Priority: High    Class: Acute  . Acute on chronic respiratory acidosis 02-18-21     Priority: High  . Goals of care, counseling/discussion 02-18-2021    Priority: High  . Terminal illness 02-18-21    Priority: High  . Shock circulatory ([place]) 2021/02/18    Priority: High    Class: Acute  . Acute respiratory failure with hypercapnia ([place]) 01/20/2021    Priority: High  . Acute metabolic encephalopathy 78/24/2353    Priority: High  . Muscular dystrophy ([place]) 05/05/2016    Priority: High  . Moderate protein-calorie malnutrition ([place]) 05/05/2016    Priority: High  . Patient is Jehovah's Witness 02-18-21    Priority: Medium    Class: Chronic  . Pneumothorax 01/12/2021    Priority: Medium  . Bed confinement status 2021-02-18    Priority: Low    Class: Chronic  . On total parenteral nutrition (TPN) 10/26/2020    Priority: Low  . Pneumothorax on right 01/12/2021  . Staphylococcus aureus bacteremia 10/27/2020  . Bacteremia due to coagulase-negative Staphylococcus 10/26/2020  . Coag negative Staphylococcus bacteremia 08/21/2020  . Abnormal LFTs 08/21/2020  . Pneumothorax on left 08/21/2020  . Sepsis ([place]) 06/14/2020  . Pneumomediastinum ([place]) 02/06/2020  . Pressure injury of skin 09/17/2019  . Intestinal volvulus ([place])   . 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  DO NOT INTUBATE status  P:   Continue BiPAP - 20/10 and 60% fio2 Monitor PTx - chest tube if significantly worse (yes per dad)   A:   Advanced Duchenne muscular  dystrophy at baseline Admission: Obtunded with severe respiratory acidosis P:   Start bicarbonate infusion after giving bicarbonate bolus    A:   Circulatory shock 02-15-2021 -possibly due to acidosis versus volume depletion versus sepsis  02-15-21 - severe refractory circulatory shock  P:  Fluid bolus Check random cortisol and start stress dose steroids 02-15-21   A: Normal echocardiogram January 2022  P: Clinically follow    A: Sinus tachycardia   P: Telemetry monitoring   A:   History of line sepsis in November 2021 and again in January 2022 [on TPN and has PICC line, last PICC line change was in November 2021]  -At high risk for sepsis currently  P:   Check procalcitonin Start broad-spectrum empiric antibiotics  -Vancomycin 02/15/2021 -Zosyn 2021-02-15   A:  At risk for AKI P:  Support metabolic parameters and hemodynamics   A:  At risk for electrolyte imbalance P: Monitor Potassium goal greater than 4 Magnesium goal greater than 2    A:   History of volvulus and on chronic TPN SEvere baseline Protein calorie malnutrition  P:   N.p.o. TPN to continue   A:  Anemia of chronic disease and critical illness Jehovah's Witness and will not take blood  02/15/21: Hemoglobin 11 g% and stable   P:  -No blood transfusion at all due to personal values -Limit phlebotomy to the extent possible    A At risk for thrombocytopenia due to sepsis or anticoagulation  P Heparin for DVT prophylaxis   A:   At risk for hypo and hyperglycemia P:   SSI   Advanced Duchenne muscular dystrophy - bed bound status History of sacral decub  Plan  -Supportive care  Global  - will likely not survive this hospitlaization  Best practice (right click and "Reselect all SmartList Selections" daily)  Diet:  NPO - TPN Pain/Anxiety/Delirium protocol (if indicated): Yes (RASS goal 0) VAP protocol (if indicated): Not indicated DVT prophylaxis:  Subcutaneous Heparin and SCD GI prophylaxis: N/A Glucose control:  SSI Yes Central venous access:  N/A Arterial line:  N/A Foley:  N/A Mobility:  bed rest  PT consulted: N/A Last date of multidisciplinary goals of care discussion [4/22] Code Status:  limited   - DNI desired by patient family, would like to pursue any other measures including CPR Disposition: ICU    Multi-Disciplinary Goals of Care Discussion Date of Discussion 2021-02-15 and is Day 1 since admit  Primary service for patient ccm  Location of discussion [place] [facility] bed 1233  Family and Staff present RN Zoe O and patient dad  Summary of discussion Patient is DNR but dad wants CPR. Currently in circ shock. Explained all interventions being done. Dad expressed that at last visit BiPAP setting changed and 1 day without TPN and wonedring if those weree contributory factors for current readmit. Explained that cardiac arrest = natural  Death despite current best efforts for survival goal and in this situation CPR would not be medically effective. He is going to reflect on this with rest of family  Followup goals of care due  by   Misc comments if any          ATTESTATION & SIGNATURE   The patient Peter Hayes is critically ill with multiple organ systems failure and requires high complexity decision making for assessment and support, frequent evaluation and titration of therapies, application of advanced monitoring technologies and extensive interpretation of multiple databases.   Critical Care Time devoted to patient care services described in this note is  75  Minutes. This time reflects time of care of this signee Dr Brand Males. This critical care time does not reflect procedure time, or teaching time or supervisory time of PA/NP/Med student/Med Resident etc but could involve care discussion time     Dr. Brand Males, M.D., [facility].C.P Pulmonary and Critical Care Medicine Staff Physician [place]  Pulmonary and Critical Care Pager: [phone], If no answer or between  15:00h - 7:00h: call [phone]  Feb 13, 2021 7:34 AM    LABS    PULMONARY Recent Labs  Lab 01/08/2021 0458 01/11/2021 0800 01/16/2021 0847 01/25/2021 1323 2021-02-13 0141  PHART  --   --  6.959* 7.053* 6.979*  PCO2ART  --   --  >120.0* >120* 163*  PO2ART  --   --  328* 265* 115*  HCO3 35.1* 36.4* 42.1* 42.4* 36.4*  O2SAT 85.7 86.9 99.4 99.6 97.6    CBC Recent Labs  Lab 01/13/2021 0407  HGB 11.4*  HCT 40.5  WBC 6.5  PLT 144*    COAGULATION No results for input(s): INR in the last 168 hours.  CARDIAC  No results for input(s): TROPONINI in the last 168 hours. No results for input(s): PROBNP in the last 168 hours.   CHEMISTRY Recent Labs  Lab 02/04/2021 0458 01/29/2021 0800 13-Feb-2021 0421  NA 147* 152* 149*  K SPECIMEN CONTAMINATED, UNABLE TO PERFORM TEST(S). 3.8 4.7  CL 104 110 111  CO2 36* 36* 35*  GLUCOSE SPECIMEN CONTAMINATED, UNABLE TO PERFORM TEST(S). 154* 112*  BUN 18 16 38*  CREATININE <0.30* <0.30* <0.30*  CALCIUM 8.8* 8.3* 7.8*  MG  --  1.7 2.0  PHOS  --  3.6 5.1*   CrCl cannot be calculated (This lab value cannot be used to calculate CrCl because it is not a number: <0.30).   LIVER Recent Labs  Lab 02/02/2021 0458 2021/02/13 0421  AST 30 33  ALT 36 38  ALKPHOS 122 118  BILITOT 0.4 0.6  PROT 7.7 6.3*  ALBUMIN 3.7 3.1*     INFECTIOUS Recent Labs  Lab 01/13/2021 0407 01/14/2021 0458  LATICACIDVEN 0.5 0.7     ENDOCRINE CBG (last 3)  Recent Labs    02/03/2021 1920 01/30/2021 2321 02-13-21 0341  GLUCAP 163* 167* 138*         IMAGING x48h  - image(s) personally visualized  -   highlighted in bold CT Angio Chest PE W and/or Wo Contrast  Result Date: 01/27/2021 CLINICAL DATA:  Worsening shortness of breath and fever EXAM: CT ANGIOGRAPHY CHEST WITH CONTRAST TECHNIQUE: Multidetector CT imaging of the chest was performed using the standard protocol during bolus  administration of intravenous contrast. Multiplanar CT image reconstructions and MIPs were obtained to evaluate the vascular anatomy. CONTRAST:  42mL OMNIPAQUE IOHEXOL 350 MG/ML SOLN COMPARISON:  01/12/2021 FINDINGS: Cardiovascular: Normal heart size. No pericardial effusion. Right PICC in place. No pulmonary artery filling defects. Negative aorta Mediastinum/Nodes: No adenopathy or inflammation seen. Visible thymus without nodularity or hypertrophy. Lungs/Pleura: Small biapical pneumothorax without interval change.  There is pleural and subpleural reticulation at the apices which has a scar-like appearance. Trace dependent pleural fluid on the right, unchanged. Upper Abdomen: Negative Musculoskeletal: Severe muscular atrophy in the setting of muscular dystrophy. Scoliosis and osteopenia. No acute finding Review of the MIP images confirms the above findings. IMPRESSION: 1. Stable compared to 2 weeks prior. 2. Small bilateral pneumothorax with biapical pleural based scarring. 3. Negative for pulmonary embolism. Electronically Signed   By: Monte Fantasia M.D.   On: 01/13/2021 07:02   DG CHEST PORT 1 VIEW  Result Date: 13-Feb-2021 CLINICAL DATA:  Pneumothorax EXAM: PORTABLE CHEST 1 VIEW COMPARISON:  Yesterday FINDINGS: Small biapical pneumothorax by recent chest CT, only visible on the left today. Left pneumothorax is stable or slightly larger. New airspace disease in the right lower chest. Biapical reticulation with scar-like appearance by CT. Normal heart size. Unremarkable right PICC. IMPRESSION: Small bilateral pneumothorax by admission chest CT. Only the left is visible today and is stable or slightly increased. New right-sided airspace disease Electronically Signed   By: Monte Fantasia M.D.   On: 2021/02/13 04:36   DG Chest Port 1 View  Result Date: 01/30/2021 CLINICAL DATA:  Decreased level of consciousness. Shortness of breath EXAM: PORTABLE CHEST 1 VIEW COMPARISON:  Earlier today FINDINGS: Right PICC  with tip at the upper cavoatrial junction. Small biapical pneumothorax without detected progression. Reticulation in the upper lobes attributed to scarring. Normal heart size. Prominent amount of upper abdominal bowel gas in this patient on BiPAP. IMPRESSION: Stable compared to earlier today, including small biapical pneumothorax. Electronically Signed   By: Monte Fantasia M.D.   On: 01/16/2021 09:19   DG Chest Portable 1 View  Result Date: 01/31/2021 CLINICAL DATA:  Weakness and shortness of breath EXAM: PORTABLE CHEST 1 VIEW COMPARISON:  01/13/2021 FINDINGS: Cardiac shadows within normal limits. Right-sided PICC line is noted in satisfactory position and stable. Lungs are well aerated bilaterally without focal infiltrate or sizable effusion. Small left apical pneumothorax is again noted and stable. No bony abnormality is seen. IMPRESSION: Stable left apical pneumothorax.  No new focal abnormality is noted. Electronically Signed   By: Inez Catalina M.D.   On: 01/24/2021 03:53

## 2021-02-05 NOTE — Progress Notes (Signed)
PHARMACY - TOTAL PARENTERAL NUTRITION CONSULT NOTE   Indication: Prolonged ileus, chronic home TPN  Patient Measurements: Height: 5\' 5"  (165.1 cm) Weight: 37.4 kg (82 lb 7.2 oz) IBW/kg (Calculated) : 61.5 TPN AdjBW (KG): 34.7 Body mass index is 13.72 kg/m. Usual Weight: 34.7 kg  Assessment: 25 yo M on chronic TPN PTA. Pharmacy consulted to manage TPN while in hospital. 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 Recently admitted 4/6-4/9.   Glucose / Insulin: No hx DM.  - Hypoglycemic episode 4/22 prior to TPN requiring D50, repcheck 183 - CBG range since TPN started 138-167: 5 units SSI used Electrolytes: Na elevated but improved (152 > 149), CO2 elevated, Cl at upper limit of normal. K at ULN (4.7), Phos elevated 5.1, Corrected Ca WNL (9.32), Mg WNL (2.0) after 2g bolus yesterday. Renal: SCr low, BUN elevated (38), UOP 339ml recorded Hepatic: LFTs wnl Intake / Output; MIVF: NS at 55 ml/hr, 1L NS bolus given overnight for hypotension, I/O +3490 Trig: WNL Prealb: 9.4 GI Imaging: GI Surgeries / Procedures:  Hx ileus s/p diverting ostomy on cyclic TPN PTA  Central access: PICC 1/24 PTA TPN start date: on PTA  Nutritional Goals (per RD recommendation on 4/8): Kcal:  1415-1630 kcal, Protein:  70-80 grams, Fluid:  >/= 1.6 L/day Goal TPN rate is 70 mL/hr (provides 84 g of protein and 1414 kcals per day)  Current Nutrition:  NPO  Home TPN provides 999 kcal, 54 grams protein per day. Receives 1000 ml over 18hr per day with 1hr ramp up and ramp down  Plan:  Continue TPN, will use continuous rate while inpatient at 70 mL/hr at 1800 Electrolytes in TPN:   Na 91mEq/L  K 9mEq/L (decrease)  Ca 63mEq/L (none in home formula)  Mg 14mEq/L  Phos 64mmol/L (remove)  Cl:Ac  1:1 Add standard MVI and trace elements to TPN Continue Sensitive q4h SSI and adjust as needed  MIVF per CCM: NaBicarb 161mEq in D5 at 75 ml/hr Monitor TPN labs on Mon/Thurs, full lab panel in AM  Peggyann Juba, PharmD, [place]: [phone] Feb 10, 2021,7:07 AM

## 2021-02-05 NOTE — Progress Notes (Signed)
Pharmacy Antibiotic Note  Peter Hayes is a 25 y.o. male with MD admitted on 01/22/2021 with increased weakness and fatigue.  He decompensated overnight developing circulatory shock and worsening pneumothorax. Pharmacy has been consulted for vancomycin and zosyn dosing for presumed sepsis.  Plan: Zosyn 3.375gm IV q8h (4hr extended infusions) Vancomycin 750mg  IV q12h - will check levels at steady state given low weight and likelihood that SCr underestimates renal function with low muscle mass Follow up renal function & cultures  Height: 5\' 5"  (165.1 cm) Weight: 37.4 kg (82 lb 7.2 oz) IBW/kg (Calculated) : 61.5  Temp (24hrs), Avg:97.8 F (36.6 C), Min:97.5 F (36.4 C), Max:98.6 F (37 C)  Recent Labs  Lab 01/18/2021 0407 01/16/2021 0458 01/23/2021 0800 Feb 25, 2021 0421  WBC 6.5  --   --  7.6  CREATININE  --  <0.30* <0.30* <0.30*  LATICACIDVEN 0.5 0.7  --   --     CrCl cannot be calculated (This lab value cannot be used to calculate CrCl because it is not a number: <0.30).    Allergies  Allergen Reactions  . Fentanyl Other (See Comments)    Dizziness  . Other Other (See Comments)    As per father allergic to one more medicine, but don't know the name.    Antimicrobials this admission: 4/23 Vancomycin >> 4/23 Zosyn >>  Dose adjustments this admission:   Microbiology results: 4/22 MRSA PCR: neg 4/22 COVID/Flu: neg/neg  Thank you for allowing pharmacy to be a part of this patient's care.  Peggyann Juba, PharmD, BCPS Pharmacy: [phone] 2021-02-25 8:12 AM

## 2021-02-05 NOTE — Discharge Summary (Addendum)
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 patient with    has a past medical history of Acute respiratory failure with hypoxia and hypercapnia ([place]) (05/16/2018), HCAP (healthcare-associated pneumonia) (12/01/2017), High anion gap metabolic acidosis (2/45/8099), Hypotension, Ileus ([place]) (09/23/2017), Leukocytosis (05/05/2016), MD (muscular dystrophy) ([place]), Refusal of blood product, Respiratory alkalosis (05/05/2016), Sepsis ([place]) (05/05/2016), Severe sepsis ([place]) (09/16/2019), and Sinus tachycardia (05/05/2016).   has a past surgical history that includes Eye surgery; Flexible sigmoidoscopy (N/A, 01/10/2018); Small intestine surgery; and Colostomy.   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 Recently admitted 4/7 - 4/9 for left back pain, found to have pneumothorax. Of note, patient is a Jehovah's witness and does not wish to  receive any blood products. On presentation to ED, patient's initial respiratory status was stable (sats 96% on 3L BiPAP, uses at home). Initial VBG demonstrated pH 7.182, PCO2 97.5 and patient was transitioned from home to ED BiPAP. Repeat VBG was slightly worsened and ABG was obtained, demonstrating severe respiratory acidosis with pH 6.95/pCO2 >120/pO2 328/Bicarb 42.1. After discussion with patient/family, ED provider reported family's desire for partial code status with DNI order placed. PCCM was consulted for further recommendations and management.  Recent admissions  - Early April 2022 for small right pneumothorax -January 2022: Coagulase-negative s staphylococcal bacteremia likely due to  A line -November 2021 for right lower lobe pneumonia and MSSA bacteremia due to PICC line [changed PICC line and 2D echo was negative for endocarditis]  Pneumothorax history:  - Small right pneumothorax developed in April 2022 [early April].  -  Chronic left pneumothorax 4 months as of April 2022 -No chest tube treatment    COURSE  4/22 Presented to [facility] ED with several day history of fatigue/increasing weakness. O2 sats initially 96% on 3L BiPAP (home machine); increasingly worse hypercarbic respiratory failure in ED prompting intubation inquiry; patient family wished for partial code/DNI order at this time. Transferred to ICU.   4/23-He continues to be in refractory shock and acidosis.  He is unresponsive Hiigh wBC, cxr rpeort of infiltrates and high PCT suggests HCAP. He is covered by antibotics but is worse overall.  The extended family has gathered at the bedside.  They requested a meeting and this included the dad, his brother, his 2 sisters.  I met with them in the conference room.  They describe that generally patient can bounce back after an acute illness but at the same time have not seen him this  ill.  He has not been this persistently unresponsive during medical illnesses.  Explained to them  septic shock and acute metabolic encephalopathy and acute on chronic respiratory hypercapnic and hypoxemic failure.  Currently patient is in refractory septic shock.  We discussed prognosis which looks very bad.  Yet at the same time it is possible with interventions of pressors (currently on Levophed and vasopressin then we will add Neo-Synephrine], fluid bolus, hydrocortisone and antibiotics he improves.  Also explained that refractory shock has a really bad prognosis.  They understood this.  Explained that current goal is to try to improve him and have him survive this illness but in the event it fails cardiac arrest would ensue and this would mean natural death.  I explained CPR is a medical intervention trying to resuscitate.  Explained that this would be medically ineffective.  The family agreed with this and agreed to no CPR no intubation but still full medical care  We also wanted to ensure concurrent comfort. Currently patient is pain-free and is comfortable. Explained that visual distress would be an indicator of any discomfort and medical team and family would communicate with teachers about this and take appropriate interventions as necessary   Expired later on 2021/02/06               SIGNED Dr. Brand Males, M.D., [facility].C.P Pulmonary and Critical Care Medicine Staff Physician [facility] Pager: [phone], If no answer or between  15:00h - 7:00h: call [phone]  02/01/2021 9:02 AM

## 2021-02-05 NOTE — Progress Notes (Signed)
Patient asystole at 1507, this RN and Lissa Morales, RN confirmed TOD w/ stethoscope x2 minutes each. Dr. Chase Caller paged, awaiting response.

## 2021-02-05 NOTE — Care Plan (Addendum)
This 25 years old male with chronic respiratory failure due to end-stage Duchenne muscular dystrophy on chronic BiPAP, chronic small bilateral pneumothoraces admitted with acute on chronic hypoxic respiratory failure and hypercapnic respiratory failure with severe respiratory acidosis.  Patient has poor prognosis and patient is in circulatory shock requiring pressor support.  Patient is DNI only, Patient is accepted in ICU.  We will sign out.  Shawna Clamp, MD [facility] hospitalist

## 2021-02-05 NOTE — Progress Notes (Signed)
Patient's systolic in the 78X, on 40 levo, 0.03 vasopressin, 400 neo. Patient appeared to be uncomfortable, Dr. Chase Caller notified. Verbal order for PRN morphine placed. Care team and family aware patient is actively dying. Will continue to evaluate if family would like to continue current measures and monitor patient's clinical status.

## 2021-02-05 NOTE — Progress Notes (Signed)
[facility] Progress Note Patient Name: Peter Hayes DOB: [birth year] MRN: [id number]   Date of Service  2021/02/21  HPI/Events of Note  Multiple issues: 1. BP is soft at 81/45 with MAP = 55. Hgb = 11.4. Last LVEF = 60-65% and 2. Last pH = 7.053 at 1:23 PM yesterday and has not been repeated.   eICU Interventions  Plan: 1. Bolus with 0.9 NaCl 1 liter IV over 1 hour now.  2. ABG STAT.     Intervention Category Major Interventions: Acid-Base disturbance - evaluation and management;Hypotension - evaluation and management  Lysle Dingwall 02-21-21, 1:41 AM

## 2021-02-05 NOTE — Progress Notes (Signed)
   Recent Labs  Lab 2021/02/10 0849  PROCALCITON 5.86     Recent Labs  Lab 01/18/2021 0458 01/11/2021 0800 01/07/2021 0847 01/25/2021 1323 2021-02-10 0141  PHART  --   --  6.959* 7.053* 6.979*  PCO2ART  --   --  >120.0* >120* 163*  PO2ART  --   --  328* 265* 115*  HCO3 35.1* 36.4* 42.1* 42.4* 36.4*  O2SAT 85.7 86.9 99.4 99.6 97.6   He continues to be in refractory shock and acidosis.  He is unresponsive.  The extended family has gathered at the bedside.  They requested a meeting and this included the dad, his brother, his 2 sisters.  I met with them in the conference room.  They describe that generally patient can bounce back after an acute illness but at the same time have not seen him this ill.  He has not been this persistently unresponsive during medical illnesses.  Explained to them septic shock and acute metabolic encephalopathy and acute on chronic respiratory hypercapnic and hypoxemic failure.  Currently patient is in refractory septic shock.  We discussed prognosis which looks very bad.  Yet at the same time it is possible with interventions of pressors (currently on Levophed and vasopressin then we will add Neo-Synephrine], fluid bolus, hydrocortisone and antibiotics he improves.  Also explained that refractory shock has a really bad prognosis.  They understood this.  Explained that current goal is to try to improve him and have him survive this illness but in the event it fails cardiac arrest would ensue and this would mean natural death.  I explained CPR is a medical intervention trying to resuscitate.  Explained that this would be medically ineffective.  The family agreed with this and agreed to no CPR no intubation but still full medical care  We also wanted to ensure concurrent comfort. Currently patient is pain-free and is comfortable. Explained that visual distress would be an indicator of any discomfort and medical team and family would communicate with teachers about this and take  appropriate interventions as necessary  They are in agreement with this plan   30 minutes additional critical care time      SIGNATURE    Dr. Brand Males, M.D., F.C.C.P,  Pulmonary and Critical Care Medicine Staff Physician, [place] Director - Interstitial Lung Disease  Program  Pulmonary [place] at [place], [place], [address]  Pager: [phone], If no answer  OR between  19:00-7:00h: page [phone] Telephone (clinical office): [phone] Telephone (research): [phone]  11:19 AM Feb 10, 2021

## 2021-02-05 NOTE — Progress Notes (Signed)
[facility] Progress Note Patient Name: Peter Hayes DOB: 1996-05-19 MRN: [id number]   Date of Service  02/02/21  HPI/Events of Note  ABG on BiPAP = 6.97/163/115/36.4. He is clearly not improved with NIV. He is a DNI, however, not a DNR. Explained to family at bedside that this situation will not go well without intubation and that intubation would likely result in dependence on mechanical ventilation. They want to leave his code status as it is DNI, however, he is not DNR.  BP is marginal. BP = 89/44. Will start a Norepinephrine IV infusion, however, given his low pH, I have little hope of effective Rx.   eICU Interventions  Plan: 1. Continue BiPAP. 2. Norepinephrine IV infusion. Titrate to MAP >= 65.     Intervention Category Major Interventions: Acid-Base disturbance - evaluation and management;Respiratory failure - evaluation and management  Lysle Dingwall 02/02/2021, 3:56 AM

## 2021-02-05 NOTE — Progress Notes (Signed)
Patient had Spiderman blanket upon admission, per family. Blanket not in room, was advised by laundry services to call [employer], the facility used to [employer]. Message left with office manager stating missing item, left phone number of Eloy Fehl, patient's parent and legal guardian if able to locate.   Two personal pillows left in bed with patient; will be placed in patient belongings bag and sent to morgue with patient

## 2021-02-05 NOTE — Progress Notes (Signed)
Call from RN   - shock worse - patient also in intermittent discomfort - WBC rise and high PCT - suggests sepsis, likely pneuomonia based on CXR infiltrates  Plan  palliative prn morphine 1-$RemoveBefore'4mg'[id number]$  Q2h prn for comfort ordered VTO  DNR order started - family not ready to stop pressors per rN  For now continue current active care but no CPR/intubation if arrests  Terminal prognosis is minutes to hours away     SIGNATURE    Dr. Brand Males, M.D., F.C.C.P,  Pulmonary and Critical Care Medicine Staff Physician, [place] Director - Interstitial Lung Disease  Program  Pulmonary [place] at [place], [place], [address]  Pager: [phone], If no answer  OR between  19:00-7:00h: page [phone] Telephone (clinical office): [phone] Telephone (research): [phone]  3:09 PM 02/13/2021

## 2021-02-05 DEATH — deceased

## 2021-07-18 IMAGING — DX DG CHEST 1V PORT
1 series · 1 of 1 positions shown · non-contrast
Comparison: 08/26/2020

CLINICAL DATA: None COVID exposure 1 day ago with fevers, initial
encounter

EXAM:
PORTABLE CHEST 1 VIEW

[chest ap]
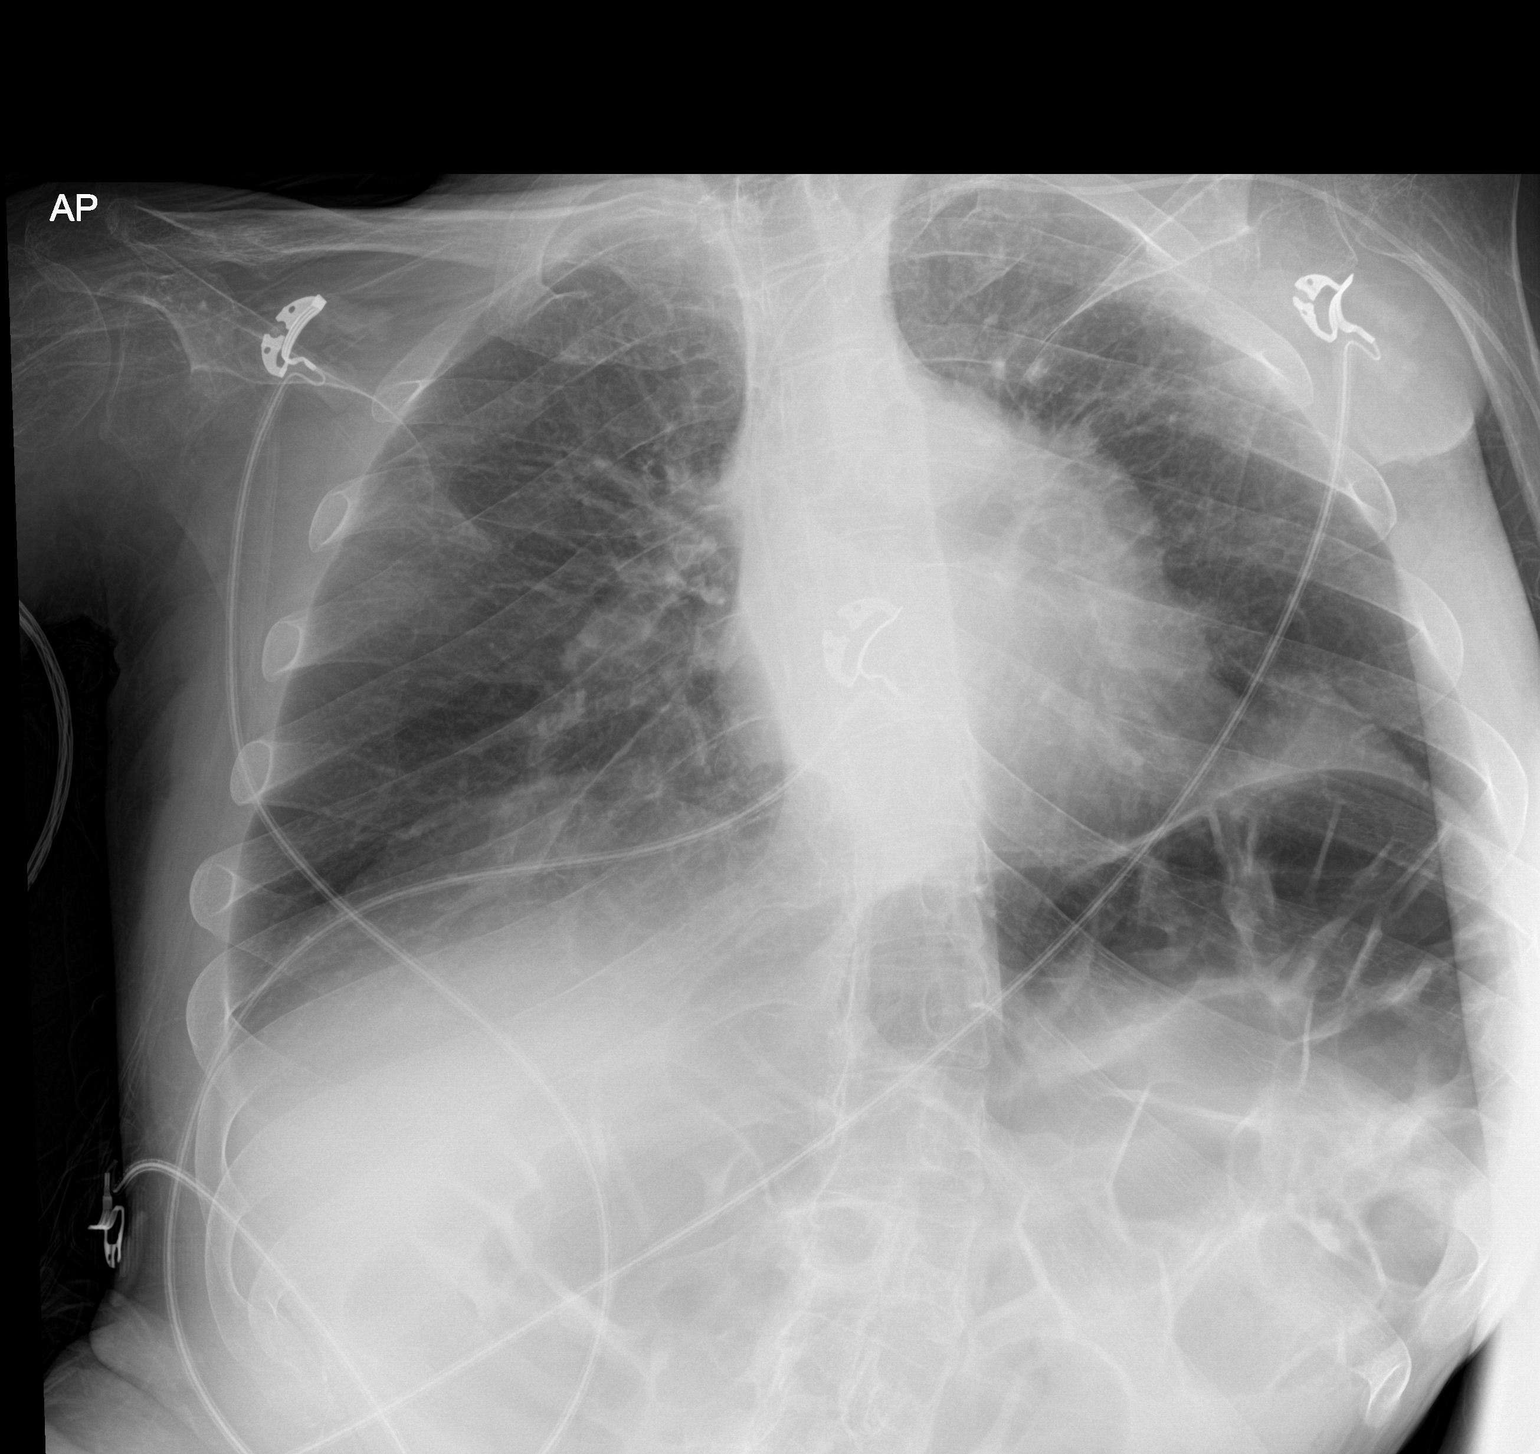

[1 of 1 positions shown; findings below may reference images not displayed]

FINDINGS: Cardiac shadow is within normal limits. The lungs are well aerated
bilaterally. Mild scarring is noted stable from the prior exam. No
acute infiltrate or sizable effusion is seen. No bony abnormality is
noted.
IMPRESSION: No acute abnormality noted.

## 2021-07-25 IMAGING — US IR PICC >5YO
1 series · 2 of 2 positions shown · non-contrast
Comparison: none

INDICATION: 24-year-old male referred for PICC

[Series 1: ir picc >5yo · 2 of 2 slices shown]
[im 1/2]
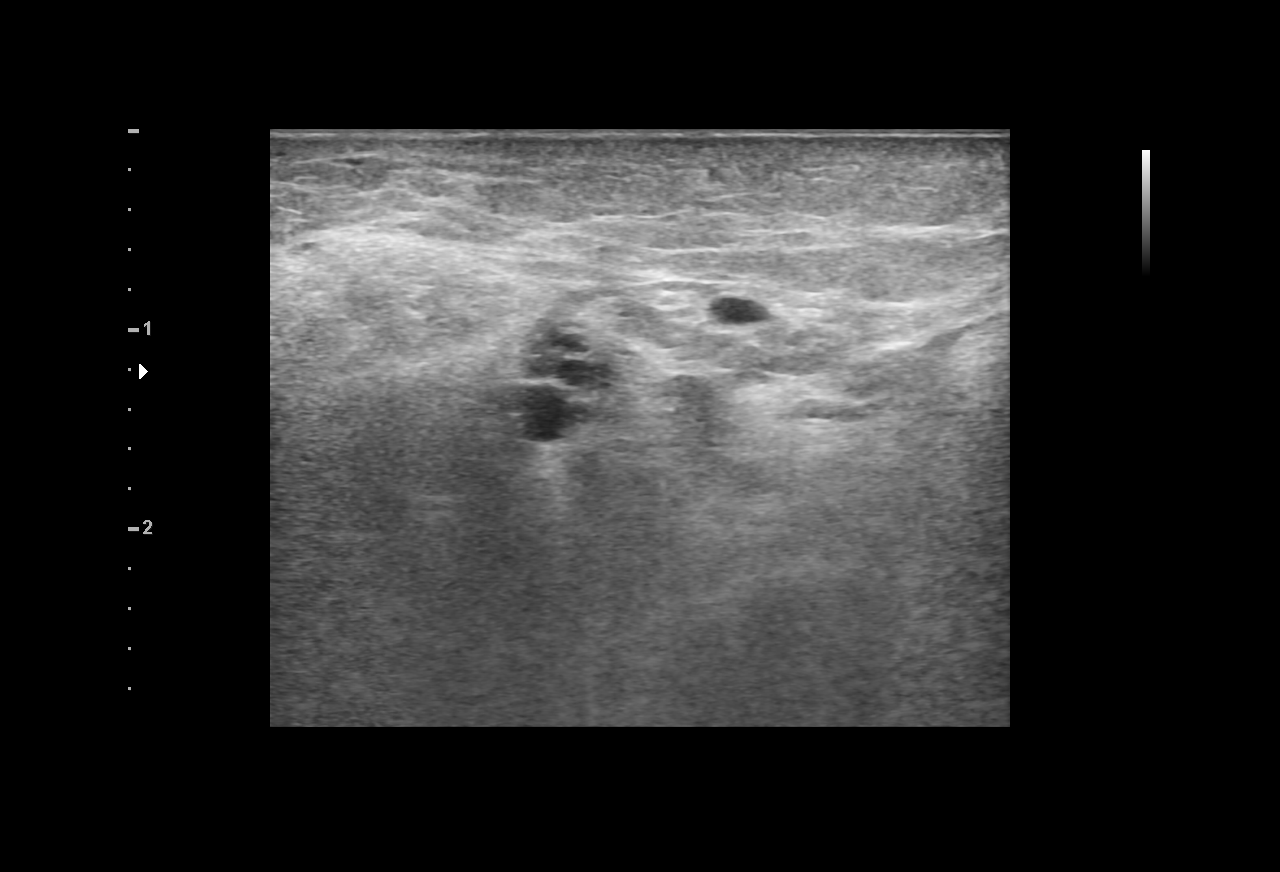
[im 2/2]
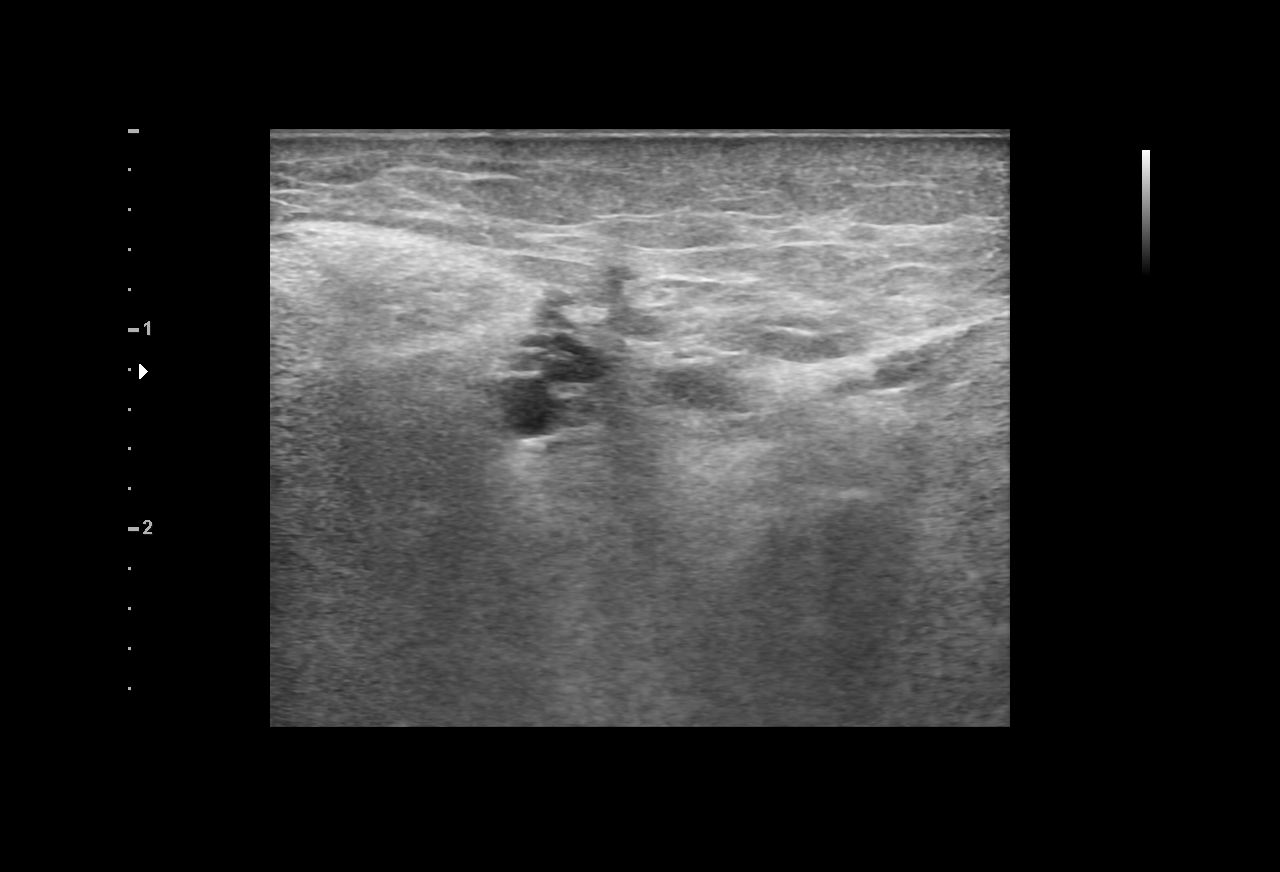

[2 of 2 positions shown; findings below may reference images not displayed]

EXAM:
PICC LINE PLACEMENT WITH ULTRASOUND AND FLUOROSCOPIC GUIDANCE

MEDICATIONS:
None

ANESTHESIA/SEDATION:
None

FLUOROSCOPY TIME:  Fluoroscopy Time: 1 minutes 42 seconds (1 mGy).

COMPLICATIONS:
None

PROCEDURE:
Informed written consent was obtained from the patient after a
thorough discussion of the procedural risks, benefits and
alternatives. All questions were addressed. Maximal Sterile Barrier
Technique was utilized including caps, mask, sterile gowns, sterile
gloves, sterile drape, hand hygiene and skin antiseptic. A timeout
was performed prior to the initiation of the procedure.

Patient was position in the supine position on the fluoroscopy table
with the right arm abducted 90 degrees. Ultrasound survey of the
upper extremity was performed with images stored and sent to PACs.

The right brachial vein was initially selected for access.

Once the patient was prepped and draped in the usual sterile
fashion, the skin and subcutaneous tissues were generously
infiltrated with 1% lidocaine for local anesthesia.

Ultrasound-guided access was attempted on 3 attempts of the right
brachial vein. Right brachial vein is quite small and the wire would
not thread.

We then attempted to use the cephalic vein. 1% lidocaine was used
for local anesthesia.

A micropuncture access kit was then used to access the targeted
cephalic vein. Wire was passed centrally, confirmed to be within the
venous system under fluoroscopy. A small stab incision was made with
an 11 blade scalpel and the sheath was then placed over the wire.
Estimated length of the catheter was then performed with the
indwelling wire.

Catheter was amputated at 35 cm length and placed with coaxial wire
through the peel-away.

Double lumen, power injectable PICC in the cephalic vein. Tip
confirmed at the cavoatrial junction, and the catheter is ready for
use.

Stat lock was placed.

Patient tolerated the procedure well and remained hemodynamically
stable throughout.

No complications were encountered and no significant blood loss was
encountered.
IMPRESSION: Status post image guided placement of right cephalic vein
double-lumen PICC.

## 2021-10-21 IMAGING — CT CT ANGIO CHEST
3 of 7 series · 18 of 36 positions shown · IV contrast (omnipaque)
Comparison: 01/12/2021

CLINICAL DATA: Worsening shortness of breath and fever

EXAM:
CT ANGIOGRAPHY CHEST WITH CONTRAST
TECHNIQUE: Multidetector CT imaging of the chest was performed using the
standard protocol during bolus administration of intravenous
contrast. Multiplanar CT image reconstructions and MIPs were
obtained to evaluate the vascular anatomy.
CONTRAST:  75mL OMNIPAQUE IOHEXOL 350 MG/ML SOLN

[Series 6: thins · axial · 0.79mm/px · z∈[+1102,+1372]mm · 13 of 315 slices shown]
[im 23/315  lung]
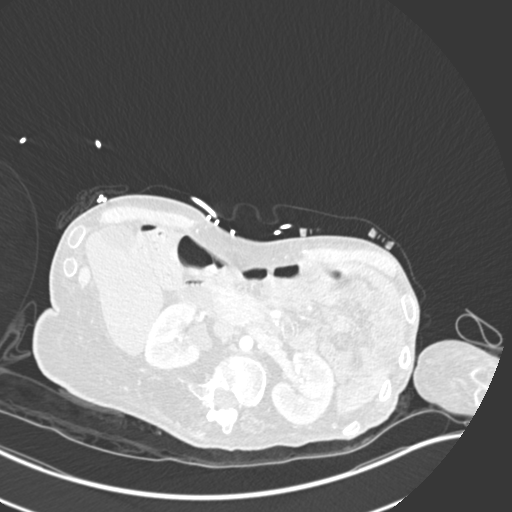
[im 45/315  mediastinal]
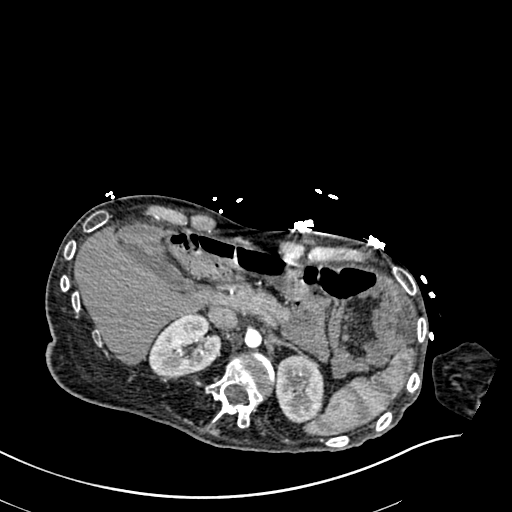
[im 68/315  lung]
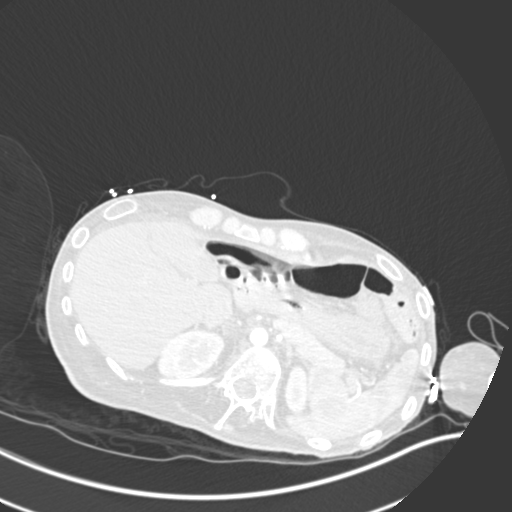
[im 90/315  mediastinal]
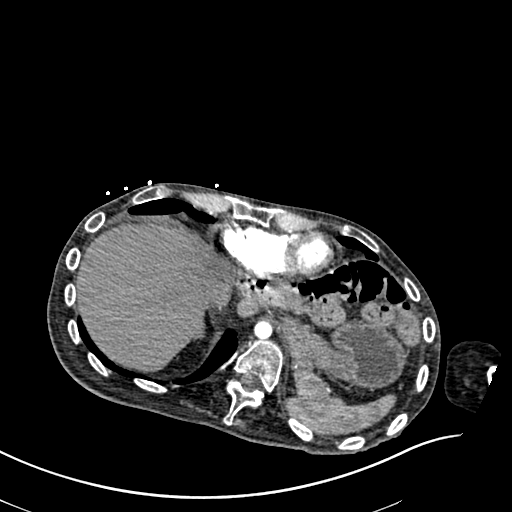
[im 113/315  lung]
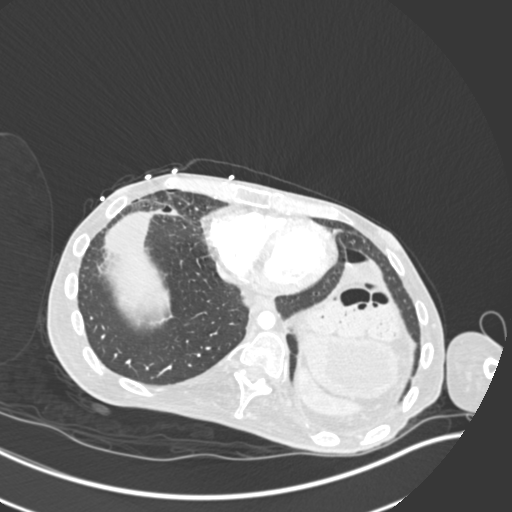
[im 135/315  mediastinal]
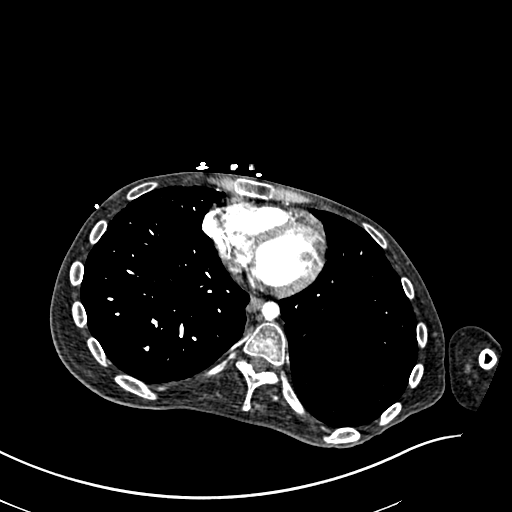
[im 158/315  lung]
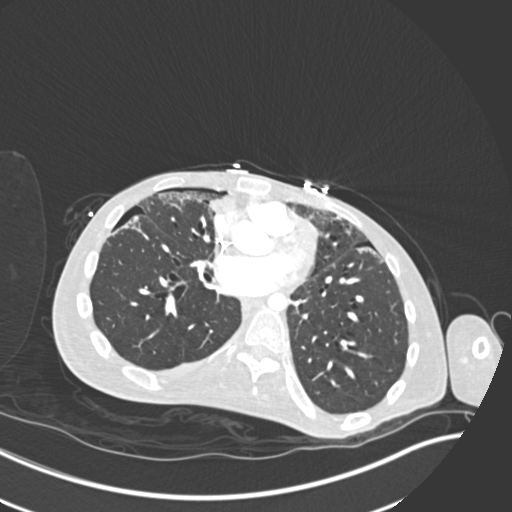
[im 180/315  mediastinal]
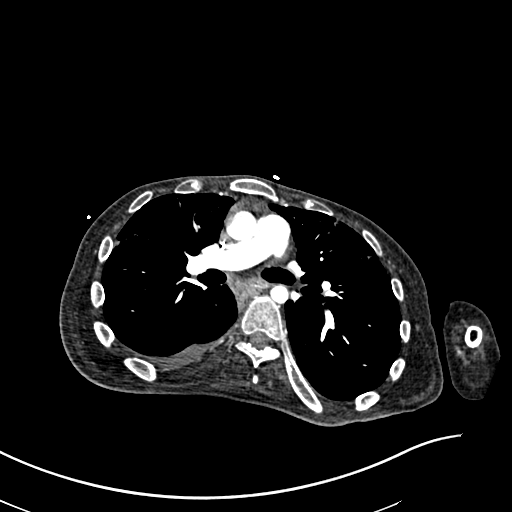
[im 202/315  lung]
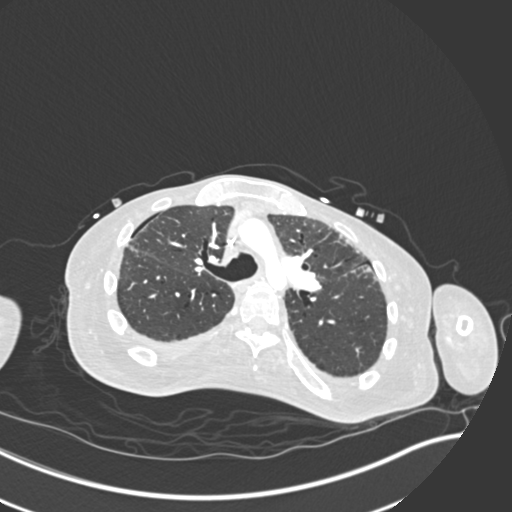
[im 225/315  mediastinal]
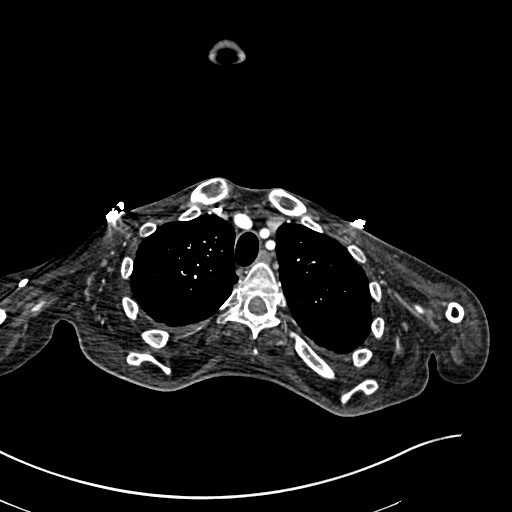
[im 247/315  lung]
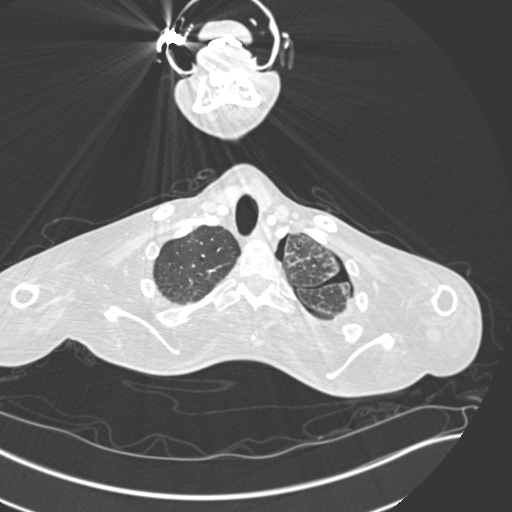
[im 270/315  mediastinal]
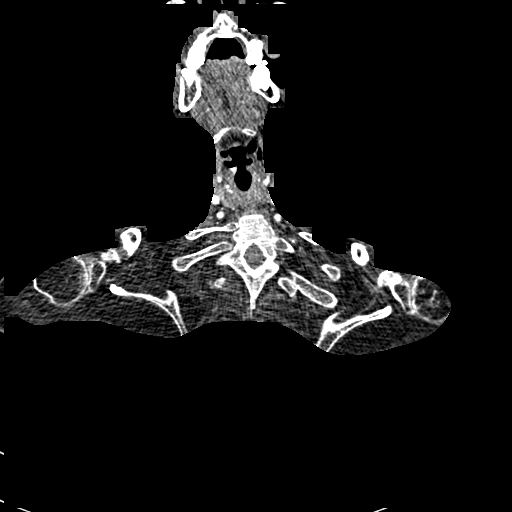
[im 292/315  lung]
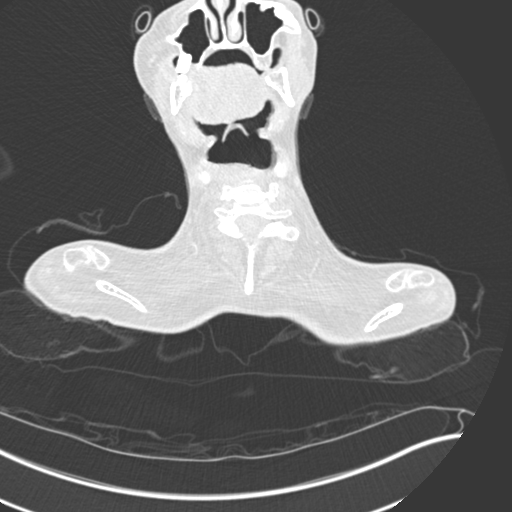

[Series 7: coronal mpr · coronal · 0.59mm/px · 1 of 124 slices shown]
[im 62/124  mediastinal]
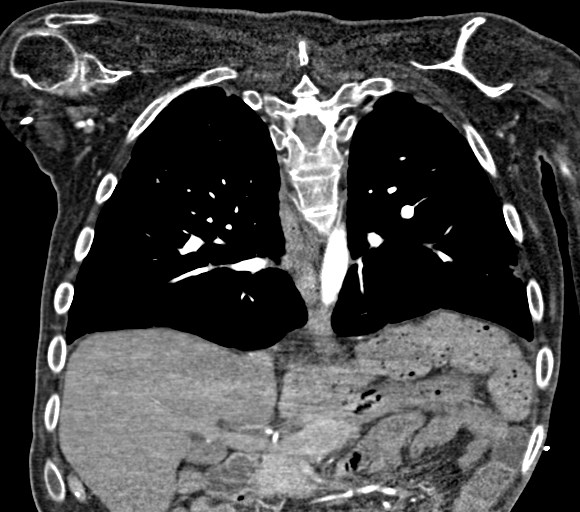

[Series 11: lung · axial · 0.79mm/px · z∈[+1188,+1342]mm · 4 of 129 slices shown]
[im 26/129  mediastinal]
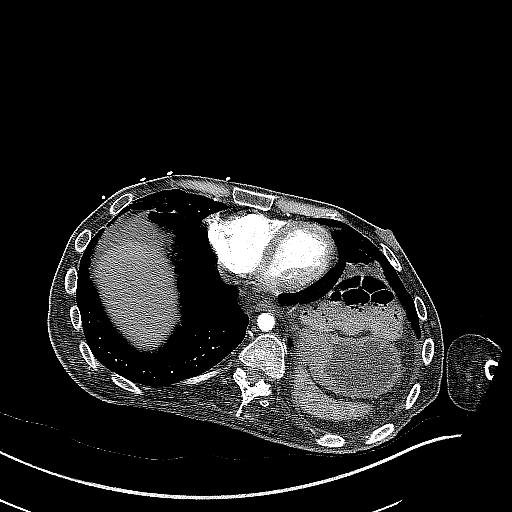
[im 52/129  mediastinal]
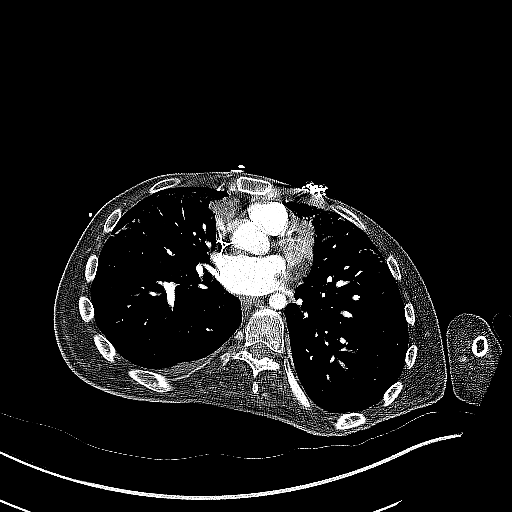
[im 77/129  mediastinal]
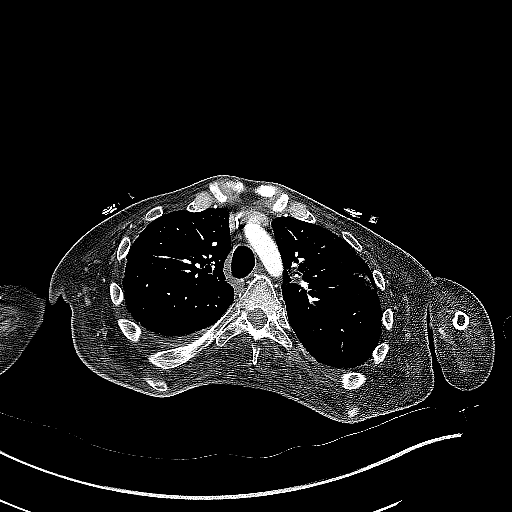
[im 103/129  mediastinal]
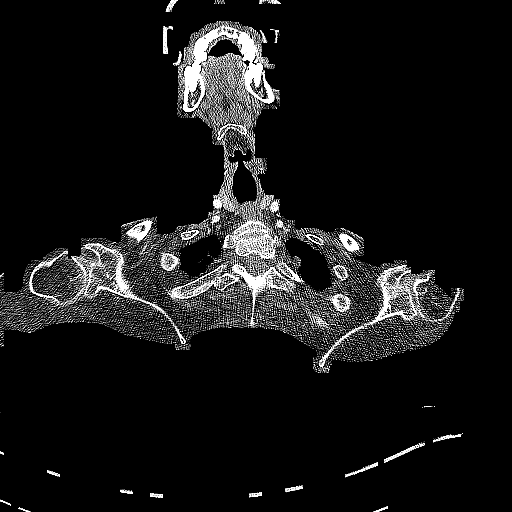

[18 of 36 positions shown; findings below may reference images not displayed]

FINDINGS: Cardiovascular: Normal heart size. No pericardial effusion. Right
PICC in place. No pulmonary artery filling defects. Negative aorta

Mediastinum/Nodes: No adenopathy or inflammation seen. Visible
thymus without nodularity or hypertrophy.

Lungs/Pleura: Small biapical pneumothorax without interval change.
There is pleural and subpleural reticulation at the apices which has
a scar-like appearance. Trace dependent pleural fluid on the right,
unchanged.

Upper Abdomen: Negative

Musculoskeletal: Severe muscular atrophy in the setting of muscular
dystrophy. Scoliosis and osteopenia. No acute finding

Review of the MIP images confirms the above findings.
IMPRESSION: 1. Stable compared to 2 weeks prior.
2. Small bilateral pneumothorax with biapical pleural based
scarring.
3. Negative for pulmonary embolism.
